# Patient Record
Sex: Female | Born: 1970 | Race: Black or African American | Hispanic: No | Marital: Married | Smoking: Never smoker
Health system: Southern US, Community
[De-identification: ages and names within clinical notes are randomized; demographics above are authoritative.]

## PROBLEM LIST (undated history)

## (undated) DIAGNOSIS — G459 Transient cerebral ischemic attack, unspecified: Secondary | ICD-10-CM

## (undated) DIAGNOSIS — G629 Polyneuropathy, unspecified: Secondary | ICD-10-CM

## (undated) DIAGNOSIS — Z807 Family history of other malignant neoplasms of lymphoid, hematopoietic and related tissues: Secondary | ICD-10-CM

## (undated) DIAGNOSIS — Z801 Family history of malignant neoplasm of trachea, bronchus and lung: Secondary | ICD-10-CM

## (undated) DIAGNOSIS — Z5189 Encounter for other specified aftercare: Secondary | ICD-10-CM

## (undated) DIAGNOSIS — Z853 Personal history of malignant neoplasm of breast: Secondary | ICD-10-CM

## (undated) DIAGNOSIS — D649 Anemia, unspecified: Secondary | ICD-10-CM

## (undated) DIAGNOSIS — G510 Bell's palsy: Secondary | ICD-10-CM

## (undated) DIAGNOSIS — I10 Essential (primary) hypertension: Secondary | ICD-10-CM

## (undated) DIAGNOSIS — C50912 Malignant neoplasm of unspecified site of left female breast: Secondary | ICD-10-CM

## (undated) DIAGNOSIS — N289 Disorder of kidney and ureter, unspecified: Secondary | ICD-10-CM

## (undated) DIAGNOSIS — F419 Anxiety disorder, unspecified: Secondary | ICD-10-CM

## (undated) HISTORY — DX: Family history of other malignant neoplasms of lymphoid, hematopoietic and related tissues: Z80.7

## (undated) HISTORY — DX: Encounter for other specified aftercare: Z51.89

## (undated) HISTORY — DX: Anemia, unspecified: D64.9

## (undated) HISTORY — PX: BREAST SURGERY: SHX581

## (undated) HISTORY — DX: Personal history of malignant neoplasm of breast: Z85.3

## (undated) HISTORY — DX: Family history of malignant neoplasm of trachea, bronchus and lung: Z80.1

## (undated) HISTORY — DX: Transient cerebral ischemic attack, unspecified: G45.9

## (undated) HISTORY — DX: Malignant neoplasm of unspecified site of left female breast: C50.912

## (undated) HISTORY — PX: UPPER GASTROINTESTINAL ENDOSCOPY: SHX188

## (undated) HISTORY — PX: WISDOM TOOTH EXTRACTION: SHX21

---

## 1998-12-02 DIAGNOSIS — C50912 Malignant neoplasm of unspecified site of left female breast: Secondary | ICD-10-CM

## 1998-12-02 HISTORY — PX: BREAST LUMPECTOMY WITH AXILLARY LYMPH NODE BIOPSY: SHX5593

## 1998-12-02 HISTORY — DX: Malignant neoplasm of unspecified site of left female breast: C50.912

## 2002-12-02 HISTORY — PX: TUBAL LIGATION: SHX77

## 2011-11-29 ENCOUNTER — Encounter: Payer: Self-pay | Admitting: *Deleted

## 2011-11-29 ENCOUNTER — Emergency Department (HOSPITAL_COMMUNITY)
Admission: EM | Admit: 2011-11-29 | Discharge: 2011-11-29 | Disposition: A | Discharge status: Home / Self Care | Payer: 59 | Attending: Emergency Medicine | Admitting: Emergency Medicine

## 2011-11-29 DIAGNOSIS — N949 Unspecified condition associated with female genital organs and menstrual cycle: Secondary | ICD-10-CM | POA: Insufficient documentation

## 2011-11-29 DIAGNOSIS — Z859 Personal history of malignant neoplasm, unspecified: Secondary | ICD-10-CM | POA: Insufficient documentation

## 2011-11-29 DIAGNOSIS — R1031 Right lower quadrant pain: Secondary | ICD-10-CM | POA: Insufficient documentation

## 2011-11-29 DIAGNOSIS — N898 Other specified noninflammatory disorders of vagina: Secondary | ICD-10-CM | POA: Insufficient documentation

## 2011-11-29 DIAGNOSIS — E669 Obesity, unspecified: Secondary | ICD-10-CM | POA: Insufficient documentation

## 2011-11-29 DIAGNOSIS — I10 Essential (primary) hypertension: Secondary | ICD-10-CM | POA: Insufficient documentation

## 2011-11-29 DIAGNOSIS — M545 Low back pain, unspecified: Secondary | ICD-10-CM | POA: Insufficient documentation

## 2011-11-29 DIAGNOSIS — Z79899 Other long term (current) drug therapy: Secondary | ICD-10-CM | POA: Insufficient documentation

## 2011-11-29 DIAGNOSIS — N939 Abnormal uterine and vaginal bleeding, unspecified: Secondary | ICD-10-CM

## 2011-11-29 HISTORY — DX: Essential (primary) hypertension: I10

## 2011-11-29 LAB — URINALYSIS, ROUTINE W REFLEX MICROSCOPIC
Bilirubin Urine: NEGATIVE
Glucose, UA: NEGATIVE mg/dL
Ketones, ur: NEGATIVE mg/dL
Leukocytes, UA: NEGATIVE
Nitrite: NEGATIVE
Protein, ur: NEGATIVE mg/dL
Specific Gravity, Urine: 1.015 (ref 1.005–1.030)
Urobilinogen, UA: 0.2 mg/dL (ref 0.0–1.0)
pH: 6 (ref 5.0–8.0)

## 2011-11-29 LAB — CBC
HCT: 35.8 % — ABNORMAL LOW (ref 36.0–46.0)
Hemoglobin: 11.6 g/dL — ABNORMAL LOW (ref 12.0–15.0)
MCH: 24.3 pg — ABNORMAL LOW (ref 26.0–34.0)
MCHC: 32.4 g/dL (ref 30.0–36.0)
MCV: 75.1 fL — ABNORMAL LOW (ref 78.0–100.0)
Platelets: 229 10*3/uL (ref 150–400)
RBC: 4.77 MIL/uL (ref 3.87–5.11)
RDW: 16.3 % — ABNORMAL HIGH (ref 11.5–15.5)
WBC: 6.3 10*3/uL (ref 4.0–10.5)

## 2011-11-29 LAB — BASIC METABOLIC PANEL
BUN: 14 mg/dL (ref 6–23)
CO2: 27 mEq/L (ref 19–32)
Calcium: 10.2 mg/dL (ref 8.4–10.5)
Chloride: 102 mEq/L (ref 96–112)
Creatinine, Ser: 0.82 mg/dL (ref 0.50–1.10)
GFR calc Af Amer: >90 mL/min (ref >90)
GFR calc non Af Amer: 88 mL/min — ABNORMAL LOW (ref >90)
Glucose, Bld: 83 mg/dL (ref 70–99)
Potassium: 3.3 mEq/L — ABNORMAL LOW (ref 3.5–5.1)
Sodium: 138 mEq/L (ref 135–145)

## 2011-11-29 LAB — URINE MICROSCOPIC-ADD ON

## 2011-11-29 LAB — POCT PREGNANCY, URINE: Preg Test, Ur: NEGATIVE

## 2011-11-29 MED ORDER — NORETHINDRONE ACETATE 5 MG PO TABS: 10.0000 mg | ORAL_TABLET | Freq: Every day | ORAL | Status: DC

## 2011-11-29 NOTE — ED Notes (Signed)
Pt in c/o abnormal vaginal bleeding, states she started her normal period christmas eve and has noted abnormally heavy bleeding and increased pain to right lower quadrant and back that is not normal for her

## 2011-11-29 NOTE — ED Provider Notes (Signed)
History     CSN: [id number]  Arrival date & time 11/29/11  1443   First MD Initiated Contact with Patient 11/29/11 1625      Chief Complaint  Patient presents with  . Vaginal Bleeding    (Consider location/radiation/quality/duration/timing/severity/associated sxs/prior treatment) Patient is a 40 y.o. female presenting with vaginal bleeding. The history is provided by the patient.  Vaginal Bleeding This is a new problem. The current episode started in the past 7 days. The problem occurs constantly. The problem has been unchanged. Associated symptoms include abdominal pain. Pertinent negatives include no change in bowel habit, chest pain, nausea, vertigo, visual change or vomiting. Associated symptoms comments: Negative for lightheadedness, dizziness, shortness of breath. The symptoms are aggravated by nothing. She has tried nothing for the symptoms.   the patient reports she has gone through 18 menstrual pads since last night. Reports she has had gradually increasing heavy menses since this summer, with approximately every other menstrual cycle heavy and alternating menstrual cycles normal for her without heavy bleeding or pain. The abdominal pain she describes to me as right lower quadrant pain as well as right-sided low back pain that is crampy and mild to moderate in severity. It comes in waves. She has tried ibuprofen for the pain with moderate relief. She recently moved to the area does not have a gynecologist here.  Past Medical History  Diagnosis Date  . Hypertension   . Cancer     History reviewed. No pertinent past surgical history.  History reviewed. No pertinent family history.  History  Substance Use Topics  . Smoking status: Never Smoker   . Smokeless tobacco: Not on file  . Alcohol Use: No     Review of Systems  Cardiovascular: Negative for chest pain.  Gastrointestinal: Positive for abdominal pain. Negative for nausea, vomiting and change in bowel habit.    Genitourinary: Positive for vaginal bleeding.  Neurological: Negative for vertigo.  10 systems reviewed and are negative for acute change except as noted in the HPI.   Allergies  Ace inhibitors  Home Medications   Current Outpatient Rx  Name Route Sig Dispense Refill  . ATENOLOL 25 MG PO TABS Oral Take 25 mg by mouth daily.      Marland Kitchen HYDROCHLOROTHIAZIDE 12.5 MG PO CAPS Oral Take 12.5 mg by mouth daily.      . IBUPROFEN 200 MG PO TABS Oral Take 400 mg by mouth every 6 (six) hours as needed. For pain.     . IRON PO Oral Take 1 tablet by mouth daily.      . ADULT MULTIVITAMIN W/MINERALS CH Oral Take 1 tablet by mouth daily.        BP 192/95  Pulse 71  Temp(Src) 99 F (37.2 C) (Oral)  Resp 20  SpO2 100%  Physical Exam  Nursing note and vitals reviewed. Constitutional: She is oriented to person, place, and time. She appears well-developed and well-nourished. No distress.       obese  HENT:  Head: Normocephalic and atraumatic.  Right Ear: External ear normal.  Left Ear: External ear normal.  Mouth/Throat: Oropharynx is clear and moist.  Eyes: Conjunctivae and EOM are normal. Pupils are equal, round, and reactive to light.  Neck: Normal range of motion. Neck supple.  Cardiovascular: Normal rate, regular rhythm, normal heart sounds and intact distal pulses.   Pulmonary/Chest: Effort normal and breath sounds normal. No respiratory distress. She has no wheezes.  Abdominal: Soft. Bowel sounds are normal. She exhibits no  distension and no mass. There is no tenderness. There is no rebound and no guarding.  Genitourinary: There is no tenderness or lesion on the right labia. There is no tenderness or lesion on the left labia. Cervix exhibits no motion tenderness. Right adnexum displays tenderness. Right adnexum displays no mass and no fullness. Left adnexum displays no mass, no tenderness and no fullness. There is bleeding around the vagina. No tenderness around the vagina. No foreign body  around the vagina. No signs of injury around the vagina.       Exam limited by body habitus   Musculoskeletal: Normal range of motion. She exhibits no edema and no tenderness.  Neurological: She is alert and oriented to person, place, and time. No cranial nerve deficit. Coordination normal.  Skin: Skin is warm and dry. No rash noted. No pallor.  Psychiatric: Her behavior is normal.    ED Course  Procedures (including critical care time)  Labs Reviewed  CBC - Abnormal; Notable for the following:    Hemoglobin 11.6 (*)    HCT 35.8 (*)    MCV 75.1 (*)    MCH 24.3 (*)    RDW 16.3 (*)    All other components within normal limits  BASIC METABOLIC PANEL - Abnormal; Notable for the following:    Potassium 3.3 (*)    GFR calc non Af Amer 88 (*)    All other components within normal limits  URINALYSIS, ROUTINE W REFLEX MICROSCOPIC - Abnormal; Notable for the following:    Hgb urine dipstick LARGE (*)    All other components within normal limits  URINE MICROSCOPIC-ADD ON - Abnormal; Notable for the following:    Squamous Epithelial / LPF FEW (*)    All other components within normal limits  POCT PREGNANCY, URINE  POCT PREGNANCY, URINE   No results found.   1. Vaginal bleeding, abnormal       MDM  Heavy menses. Mild anemia. Slight right adnexal tenderness- i do not suspect ovarian torsion or ectopic pregnancy in this patient and [place] in the ED is not indicated. I have spoken with Dr Benjie Karvonen who will see pt in office and has recommended initiation of Aygestin to slow bleeding. Pt voices understanding of plan.        [place], [place] 11/29/11 1944

## 2011-11-30 NOTE — ED Provider Notes (Signed)
Medical screening examination/treatment/procedure(s) were performed by non-physician practitioner and as supervising physician I was immediately available for consultation/collaboration.  Jasper Riling. Alvino Chapel, MD 11/30/11 [phone]

## 2011-12-30 ENCOUNTER — Telehealth: Payer: Self-pay | Admitting: *Deleted

## 2011-12-30 NOTE — Telephone Encounter (Signed)
Confirmed 01/31/12 genetics appt w/ pt.  Called referring w/ appt info.

## 2012-01-31 ENCOUNTER — Ambulatory Visit: Payer: 59

## 2012-01-31 NOTE — Progress Notes (Unsigned)
Pt. Failed to keep appt for genetic counseling

## 2012-02-14 ENCOUNTER — Other Ambulatory Visit: Payer: Self-pay | Admitting: Obstetrics & Gynecology

## 2012-02-14 DIAGNOSIS — Z1231 Encounter for screening mammogram for malignant neoplasm of breast: Secondary | ICD-10-CM

## 2012-03-11 ENCOUNTER — Ambulatory Visit
Admission: RE | Admit: 2012-03-11 | Discharge: 2012-03-11 | Disposition: A | Discharge status: Home / Self Care | Payer: 59 | Source: Ambulatory Visit | Attending: Obstetrics & Gynecology | Admitting: Obstetrics & Gynecology

## 2012-03-11 DIAGNOSIS — Z1231 Encounter for screening mammogram for malignant neoplasm of breast: Secondary | ICD-10-CM

## 2013-04-15 ENCOUNTER — Other Ambulatory Visit: Payer: Self-pay

## 2013-04-15 DIAGNOSIS — Z1231 Encounter for screening mammogram for malignant neoplasm of breast: Secondary | ICD-10-CM

## 2013-04-29 ENCOUNTER — Ambulatory Visit
Admission: RE | Admit: 2013-04-29 | Discharge: 2013-04-29 | Disposition: A | Discharge status: Home / Self Care | Payer: 59 | Source: Ambulatory Visit

## 2013-04-29 DIAGNOSIS — Z1231 Encounter for screening mammogram for malignant neoplasm of breast: Secondary | ICD-10-CM

## 2014-08-02 ENCOUNTER — Ambulatory Visit (INDEPENDENT_AMBULATORY_CARE_PROVIDER_SITE_OTHER): Payer: 59 | Admitting: Obstetrics & Gynecology

## 2014-08-02 ENCOUNTER — Encounter: Payer: Self-pay | Admitting: Obstetrics & Gynecology

## 2014-08-02 VITALS — BP 139/94 | HR 70 | Ht 59.75 in | Wt 211.2 lb

## 2014-08-02 DIAGNOSIS — N924 Excessive bleeding in the premenopausal period: Secondary | ICD-10-CM

## 2014-08-02 DIAGNOSIS — D259 Leiomyoma of uterus, unspecified: Secondary | ICD-10-CM

## 2014-08-02 DIAGNOSIS — Z01419 Encounter for gynecological examination (general) (routine) without abnormal findings: Secondary | ICD-10-CM

## 2014-08-02 DIAGNOSIS — Z124 Encounter for screening for malignant neoplasm of cervix: Secondary | ICD-10-CM

## 2014-08-02 DIAGNOSIS — D219 Benign neoplasm of connective and other soft tissue, unspecified: Secondary | ICD-10-CM | POA: Insufficient documentation

## 2014-08-02 DIAGNOSIS — Z1151 Encounter for screening for human papillomavirus (HPV): Secondary | ICD-10-CM

## 2014-08-02 NOTE — Progress Notes (Signed)
    GYNECOLOGY CLINIC ANNUAL PREVENTATIVE CARE ENCOUNTER NOTE  Subjective:     [name] [name] is a 43 y.o. [phone] female here for a routine annual gynecologic exam and to establish care.  Current complaints: missed period in July, but very heavy and crampy period in August. Was seen by Dr. Benjie Karvonen [facility]) in the past, last pap was last year. She does have a history of fibroids, not been checked for thyroid dysfunction recently.  No problems with sexual activity, married for 23 years!   Gynecologic History Patient's last menstrual period was 07/28/2014. Contraception: tubal ligation in 2004 Last Pap: May 2014. Results were: normal Last mammogram: 04/29/2013. Results were: normal.    History of left breast cancer s/p lumpectomy, chemotherapy and radiation in 2000.  Obstetric History OB History  Gravida Para Term Preterm AB SAB TAB Ectopic Multiple Living  3 3 2 1  0 0 0 0 0 3    # Outcome Date GA Lbr Len/2nd Weight Sex Delivery Anes PTL Lv  3 PRE 2004    F SVD     2 TRM 2000    F SVD     1 TRM 1994    M SVD       Obstetric Comments  Two adopted children Female age 28 and Female age 75.   The following portions of the patient's history were reviewed and updated as appropriate: allergies, current medications, past family history, past medical history, past social history, past surgical history and problem list.  Review of Systems Pertinent items are noted in HPI.    Objective:   BP 139/94  Pulse 70  Ht 4' 11.75" (1.518 m)  Wt 211 lb 3.2 oz (95.8 kg)  BMI 41.57 kg/m2  LMP 07/28/2014 GENERAL: Well-developed, obese female in no acute distress.  HEENT: Normocephalic, atraumatic. Sclerae anicteric.  NECK: Supple. Normal thyroid.  LUNGS: Clear to auscultation bilaterally.  HEART: Regular rate and rhythm.  BREASTS: Symmetric in size. No masses, skin changes, nipple drainage, or lymphadenopathy.  Well-healed lumpectomy scar above left areola. ABDOMEN: Soft, obese, nontender,  nondistended. No organomegaly palpated. PELVIC: Normal external female genitalia. Vagina is pink and rugated. Bloody discharge noted, no active bleeding. Normal cervix contour. Pap smear obtained. Unable to palpate uterus or adnexa secondary to habitus, but had some left sided tenderness on bimanual exam EXTREMITIES: No cyanosis, clubbing, or edema, 2+ distal pulses.  Assessment:   Annual gynecologic examination Heavy menstrual period   Plan:   Pap done, will follow up results and manage accordingly. TSH checked, will also check pelvic ultrasound to follow up fibroids. Bleeding precautions reviewed. Routine preventative health maintenance measures emphasized   Verita Schneiders, MD, [place] Attending [place] for [place]

## 2014-08-02 NOTE — Patient Instructions (Signed)
Preventive Care for Adults A healthy lifestyle and preventive care can promote health and wellness. Preventive health guidelines for women include the following key practices.  A routine yearly physical is a good way to check with your health care provider about your health and preventive screening. It is a chance to share any concerns and updates on your health and to receive a thorough exam.  Visit your dentist for a routine exam and preventive care every 6 months. Brush your teeth twice a day and floss once a day. Good oral hygiene prevents tooth decay and gum disease.  The frequency of eye exams is based on your age, health, family medical history, use of contact lenses, and other factors. Follow your health care provider's recommendations for frequency of eye exams.  Eat a healthy diet. Foods like vegetables, fruits, whole grains, low-fat dairy products, and lean protein foods contain the nutrients you need without too many calories. Decrease your intake of foods high in solid fats, added sugars, and salt. Eat the right amount of calories for you.Get information about a proper diet from your health care provider, if necessary.  Regular physical exercise is one of the most important things you can do for your health. Most adults should get at least 150 minutes of moderate-intensity exercise (any activity that increases your heart rate and causes you to sweat) each week. In addition, most adults need muscle-strengthening exercises on 2 or more days a week.  Maintain a healthy weight. The body mass index (BMI) is a screening tool to identify possible weight problems. It provides an estimate of body fat based on height and weight. Your health care provider can find your BMI and can help you achieve or maintain a healthy weight.For adults 20 years and older:  A BMI below 18.5 is considered underweight.  A BMI of 18.5 to 24.9 is normal.  A BMI of 25 to 29.9 is considered overweight.  A BMI of  30 and above is considered obese.  Maintain normal blood lipids and cholesterol levels by exercising and minimizing your intake of saturated fat. Eat a balanced diet with plenty of fruit and vegetables. Blood tests for lipids and cholesterol should begin at age 20 and be repeated every 5 years. If your lipid or cholesterol levels are high, you are over 50, or you are at high risk for heart disease, you may need your cholesterol levels checked more frequently.Ongoing high lipid and cholesterol levels should be treated with medicines if diet and exercise are not working.  If you smoke, find out from your health care provider how to quit. If you do not use tobacco, do not start.  Lung cancer screening is recommended for adults aged 55-80 years who are at high risk for developing lung cancer because of a history of smoking. A yearly low-dose CT scan of the lungs is recommended for people who have at least a 30-pack-year history of smoking and are a current smoker or have quit within the past 15 years. A pack year of smoking is smoking an average of 1 pack of cigarettes a day for 1 year (for example: 1 pack a day for 30 years or 2 packs a day for 15 years). Yearly screening should continue until the smoker has stopped smoking for at least 15 years. Yearly screening should be stopped for people who develop a health problem that would prevent them from having lung cancer treatment.  If you are pregnant, do not drink alcohol. If you are breastfeeding,   be very cautious about drinking alcohol. If you are not pregnant and choose to drink alcohol, do not have more than 1 drink per day. One drink is considered to be 12 ounces (355 mL) of beer, 5 ounces (148 mL) of wine, or 1.5 ounces (44 mL) of liquor.  Avoid use of street drugs. Do not share needles with anyone. Ask for help if you need support or instructions about stopping the use of drugs.  High blood pressure causes heart disease and increases the risk of  stroke. Your blood pressure should be checked at least every 1 to 2 years. Ongoing high blood pressure should be treated with medicines if weight loss and exercise do not work.  If you are 75-52 years old, ask your health care provider if you should take aspirin to prevent strokes.  Diabetes screening involves taking a blood sample to check your fasting blood sugar level. This should be done once every 3 years, after age 15, if you are within normal weight and without risk factors for diabetes. Testing should be considered at a younger age or be carried out more frequently if you are overweight and have at least 1 risk factor for diabetes.  Breast cancer screening is essential preventive care for women. You should practice "breast self-awareness." This means understanding the normal appearance and feel of your breasts and may include breast self-examination. Any changes detected, no matter how small, should be reported to a health care provider. Women in their 58s and 30s should have a clinical breast exam (CBE) by a health care provider as part of a regular health exam every 1 to 3 years. After age 16, women should have a CBE every year. Starting at age 53, women should consider having a mammogram (breast X-ray test) every year. Women who have a family history of breast cancer should talk to their health care provider about genetic screening. Women at a high risk of breast cancer should talk to their health care providers about having an MRI and a mammogram every year.  Breast cancer gene (BRCA)-related cancer risk assessment is recommended for women who have family members with BRCA-related cancers. BRCA-related cancers include breast, ovarian, tubal, and peritoneal cancers. Having family members with these cancers may be associated with an increased risk for harmful changes (mutations) in the breast cancer genes BRCA1 and BRCA2. Results of the assessment will determine the need for genetic counseling and  BRCA1 and BRCA2 testing.  Routine pelvic exams to screen for cancer are no longer recommended for nonpregnant women who are considered low risk for cancer of the pelvic organs (ovaries, uterus, and vagina) and who do not have symptoms. Ask your health care provider if a screening pelvic exam is right for you.  If you have had past treatment for cervical cancer or a condition that could lead to cancer, you need Pap tests and screening for cancer for at least 20 years after your treatment. If Pap tests have been discontinued, your risk factors (such as having a new sexual partner) need to be reassessed to determine if screening should be resumed. Some women have medical problems that increase the chance of getting cervical cancer. In these cases, your health care provider may recommend more frequent screening and Pap tests.  The HPV test is an additional test that may be used for cervical cancer screening. The HPV test looks for the virus that can cause the cell changes on the cervix. The cells collected during the Pap test can be  tested for HPV. The HPV test could be used to screen women aged 30 years and older, and should be used in women of any age who have unclear Pap test results. After the age of 30, women should have HPV testing at the same frequency as a Pap test.  Colorectal cancer can be detected and often prevented. Most routine colorectal cancer screening begins at the age of 50 years and continues through age 75 years. However, your health care provider may recommend screening at an earlier age if you have risk factors for colon cancer. On a yearly basis, your health care provider may provide home test kits to check for hidden blood in the stool. Use of a small camera at the end of a tube, to directly examine the colon (sigmoidoscopy or colonoscopy), can detect the earliest forms of colorectal cancer. Talk to your health care provider about this at age 50, when routine screening begins. Direct  exam of the colon should be repeated every 5-10 years through age 75 years, unless early forms of pre-cancerous polyps or small growths are found.  People who are at an increased risk for hepatitis B should be screened for this virus. You are considered at high risk for hepatitis B if:  You were born in a country where hepatitis B occurs often. Talk with your health care provider about which countries are considered high risk.  Your parents were born in a high-risk country and you have not received a shot to protect against hepatitis B (hepatitis B vaccine).  You have HIV or AIDS.  You use needles to inject street drugs.  You live with, or have sex with, someone who has hepatitis B.  You get hemodialysis treatment.  You take certain medicines for conditions like cancer, organ transplantation, and autoimmune conditions.  Hepatitis C blood testing is recommended for all people born from 1945 through 1965 and any individual with known risks for hepatitis C.  Practice safe sex. Use condoms and avoid high-risk sexual practices to reduce the spread of sexually transmitted infections (STIs). STIs include gonorrhea, chlamydia, syphilis, trichomonas, herpes, HPV, and human immunodeficiency virus (HIV). Herpes, HIV, and HPV are viral illnesses that have no cure. They can result in disability, cancer, and death.  You should be screened for sexually transmitted illnesses (STIs) including gonorrhea and chlamydia if:  You are sexually active and are younger than 24 years.  You are older than 24 years and your health care provider tells you that you are at risk for this type of infection.  Your sexual activity has changed since you were last screened and you are at an increased risk for chlamydia or gonorrhea. Ask your health care provider if you are at risk.  If you are at risk of being infected with HIV, it is recommended that you take a prescription medicine daily to prevent HIV infection. This is  called preexposure prophylaxis (PrEP). You are considered at risk if:  You are a heterosexual woman, are sexually active, and are at increased risk for HIV infection.  You take drugs by injection.  You are sexually active with a partner who has HIV.  Talk with your health care provider about whether you are at high risk of being infected with HIV. If you choose to begin PrEP, you should first be tested for HIV. You should then be tested every 3 months for as long as you are taking PrEP.  Osteoporosis is a disease in which the bones lose minerals and strength   with aging. This can result in serious bone fractures or breaks. The risk of osteoporosis can be identified using a bone density scan. Women ages 65 years and over and women at risk for fractures or osteoporosis should discuss screening with their health care providers. Ask your health care provider whether you should take a calcium supplement or vitamin D to reduce the rate of osteoporosis.  Menopause can be associated with physical symptoms and risks. Hormone replacement therapy is available to decrease symptoms and risks. You should talk to your health care provider about whether hormone replacement therapy is right for you.  Use sunscreen. Apply sunscreen liberally and repeatedly throughout the day. You should seek shade when your shadow is shorter than you. Protect yourself by wearing long sleeves, pants, a wide-brimmed hat, and sunglasses year round, whenever you are outdoors.  Once a month, do a whole body skin exam, using a mirror to look at the skin on your back. Tell your health care provider of new moles, moles that have irregular borders, moles that are larger than a pencil eraser, or moles that have changed in shape or color.  Stay current with required vaccines (immunizations).  Influenza vaccine. All adults should be immunized every year.  Tetanus, diphtheria, and acellular pertussis (Td, Tdap) vaccine. Pregnant women should  receive 1 dose of Tdap vaccine during each pregnancy. The dose should be obtained regardless of the length of time since the last dose. Immunization is preferred during the 27th-36th week of gestation. An adult who has not previously received Tdap or who does not know her vaccine status should receive 1 dose of Tdap. This initial dose should be followed by tetanus and diphtheria toxoids (Td) booster doses every 10 years. Adults with an unknown or incomplete history of completing a 3-dose immunization series with Td-containing vaccines should begin or complete a primary immunization series including a Tdap dose. Adults should receive a Td booster every 10 years.  Varicella vaccine. An adult without evidence of immunity to varicella should receive 2 doses or a second dose if she has previously received 1 dose. Pregnant females who do not have evidence of immunity should receive the first dose after pregnancy. This first dose should be obtained before leaving the health care facility. The second dose should be obtained 4-8 weeks after the first dose.  Human papillomavirus (HPV) vaccine. Females aged 13-26 years who have not received the vaccine previously should obtain the 3-dose series. The vaccine is not recommended for use in pregnant females. However, pregnancy testing is not needed before receiving a dose. If a female is found to be pregnant after receiving a dose, no treatment is needed. In that case, the remaining doses should be delayed until after the pregnancy. Immunization is recommended for any person with an immunocompromised condition through the age of 26 years if she did not get any or all doses earlier. During the 3-dose series, the second dose should be obtained 4-8 weeks after the first dose. The third dose should be obtained 24 weeks after the first dose and 16 weeks after the second dose.  Zoster vaccine. One dose is recommended for adults aged 60 years or older unless certain conditions are  present.  Measles, mumps, and rubella (MMR) vaccine. Adults born before 1957 generally are considered immune to measles and mumps. Adults born in 1957 or later should have 1 or more doses of MMR vaccine unless there is a contraindication to the vaccine or there is laboratory evidence of immunity to   each of the three diseases. A routine second dose of MMR vaccine should be obtained at least 28 days after the first dose for students attending postsecondary schools, health care workers, or international travelers. People who received inactivated measles vaccine or an unknown type of measles vaccine during 1963-1967 should receive 2 doses of MMR vaccine. People who received inactivated mumps vaccine or an unknown type of mumps vaccine before 1979 and are at high risk for mumps infection should consider immunization with 2 doses of MMR vaccine. For females of childbearing age, rubella immunity should be determined. If there is no evidence of immunity, females who are not pregnant should be vaccinated. If there is no evidence of immunity, females who are pregnant should delay immunization until after pregnancy. Unvaccinated health care workers born before 1957 who lack laboratory evidence of measles, mumps, or rubella immunity or laboratory confirmation of disease should consider measles and mumps immunization with 2 doses of MMR vaccine or rubella immunization with 1 dose of MMR vaccine.  Pneumococcal 13-valent conjugate (PCV13) vaccine. When indicated, a person who is uncertain of her immunization history and has no record of immunization should receive the PCV13 vaccine. An adult aged 19 years or older who has certain medical conditions and has not been previously immunized should receive 1 dose of PCV13 vaccine. This PCV13 should be followed with a dose of pneumococcal polysaccharide (PPSV23) vaccine. The PPSV23 vaccine dose should be obtained at least 8 weeks after the dose of PCV13 vaccine. An adult aged 19  years or older who has certain medical conditions and previously received 1 or more doses of PPSV23 vaccine should receive 1 dose of PCV13. The PCV13 vaccine dose should be obtained 1 or more years after the last PPSV23 vaccine dose.  Pneumococcal polysaccharide (PPSV23) vaccine. When PCV13 is also indicated, PCV13 should be obtained first. All adults aged 65 years and older should be immunized. An adult younger than age 65 years who has certain medical conditions should be immunized. Any person who resides in a nursing home or long-term care facility should be immunized. An adult smoker should be immunized. People with an immunocompromised condition and certain other conditions should receive both PCV13 and PPSV23 vaccines. People with human immunodeficiency virus (HIV) infection should be immunized as soon as possible after diagnosis. Immunization during chemotherapy or radiation therapy should be avoided. Routine use of PPSV23 vaccine is not recommended for American Indians, [place] Natives, or people younger than 65 years unless there are medical conditions that require PPSV23 vaccine. When indicated, people who have unknown immunization and have no record of immunization should receive PPSV23 vaccine. One-time revaccination 5 years after the first dose of PPSV23 is recommended for people aged 19-64 years who have chronic kidney failure, nephrotic syndrome, asplenia, or immunocompromised conditions. People who received 1-2 doses of PPSV23 before age 65 years should receive another dose of PPSV23 vaccine at age 65 years or later if at least 5 years have passed since the previous dose. Doses of PPSV23 are not needed for people immunized with PPSV23 at or after age 65 years.  Meningococcal vaccine. Adults with asplenia or persistent complement component deficiencies should receive 2 doses of quadrivalent meningococcal conjugate (MenACWY-D) vaccine. The doses should be obtained at least 2 months apart.  Microbiologists working with certain meningococcal bacteria, military recruits, people at risk during an outbreak, and people who travel to or live in countries with a high rate of meningitis should be immunized. A first-year college student up through age   21 years who is living in a residence hall should receive a dose if she did not receive a dose on or after her 16th birthday. Adults who have certain high-risk conditions should receive one or more doses of vaccine.  Hepatitis A vaccine. Adults who wish to be protected from this disease, have certain high-risk conditions, work with hepatitis A-infected animals, work in hepatitis A research labs, or travel to or work in countries with a high rate of hepatitis A should be immunized. Adults who were previously unvaccinated and who anticipate close contact with an international adoptee during the first 60 days after arrival in the [place] from a country with a high rate of hepatitis A should be immunized.  Hepatitis B vaccine. Adults who wish to be protected from this disease, have certain high-risk conditions, may be exposed to blood or other infectious body fluids, are household contacts or sex partners of hepatitis B positive people, are clients or workers in certain care facilities, or travel to or work in countries with a high rate of hepatitis B should be immunized.  Haemophilus influenzae type b (Hib) vaccine. A previously unvaccinated person with asplenia or sickle cell disease or having a scheduled splenectomy should receive 1 dose of Hib vaccine. Regardless of previous immunization, a recipient of a hematopoietic stem cell transplant should receive a 3-dose series 6-12 months after her successful transplant. Hib vaccine is not recommended for adults with HIV infection. Preventive Services / Frequency Ages 64 to 68 years  Blood pressure check.** / Every 1 to 2 years.  Lipid and cholesterol check.** / Every 5 years beginning at age  22.  Clinical breast exam.** / Every 3 years for women in their 88s and 53s.  BRCA-related cancer risk assessment.** / For women who have family members with a BRCA-related cancer (breast, ovarian, tubal, or peritoneal cancers).  Pap test.** / Every 2 years from ages 90 through 51. Every 3 years starting at age 21 through age 56 or 3 with a history of 3 consecutive normal Pap tests.  HPV screening.** / Every 3 years from ages 24 through ages 1 to 46 with a history of 3 consecutive normal Pap tests.  Hepatitis C blood test.** / For any individual with known risks for hepatitis C.  Skin self-exam. / Monthly.  Influenza vaccine. / Every year.  Tetanus, diphtheria, and acellular pertussis (Tdap, Td) vaccine.** / Consult your health care provider. Pregnant women should receive 1 dose of Tdap vaccine during each pregnancy. 1 dose of Td every 10 years.  Varicella vaccine.** / Consult your health care provider. Pregnant females who do not have evidence of immunity should receive the first dose after pregnancy.  HPV vaccine. / 3 doses over 6 months, if 72 and younger. The vaccine is not recommended for use in pregnant females. However, pregnancy testing is not needed before receiving a dose.  Measles, mumps, rubella (MMR) vaccine.** / You need at least 1 dose of MMR if you were born in 1957 or later. You may also need a 2nd dose. For females of childbearing age, rubella immunity should be determined. If there is no evidence of immunity, females who are not pregnant should be vaccinated. If there is no evidence of immunity, females who are pregnant should delay immunization until after pregnancy.  Pneumococcal 13-valent conjugate (PCV13) vaccine.** / Consult your health care provider.  Pneumococcal polysaccharide (PPSV23) vaccine.** / 1 to 2 doses if you smoke cigarettes or if you have certain conditions.  Meningococcal vaccine.** /  1 dose if you are age 19 to 21 years and a first-year college  student living in a residence hall, or have one of several medical conditions, you need to get vaccinated against meningococcal disease. You may also need additional booster doses.  Hepatitis A vaccine.** / Consult your health care provider.  Hepatitis B vaccine.** / Consult your health care provider.  Haemophilus influenzae type b (Hib) vaccine.** / Consult your health care provider. Ages 40 to 64 years  Blood pressure check.** / Every 1 to 2 years.  Lipid and cholesterol check.** / Every 5 years beginning at age 20 years.  Lung cancer screening. / Every year if you are aged 55-80 years and have a 30-pack-year history of smoking and currently smoke or have quit within the past 15 years. Yearly screening is stopped once you have quit smoking for at least 15 years or develop a health problem that would prevent you from having lung cancer treatment.  Clinical breast exam.** / Every year after age 40 years.  BRCA-related cancer risk assessment.** / For women who have family members with a BRCA-related cancer (breast, ovarian, tubal, or peritoneal cancers).  Mammogram.** / Every year beginning at age 40 years and continuing for as long as you are in good health. Consult with your health care provider.  Pap test.** / Every 3 years starting at age 30 years through age 65 or 70 years with a history of 3 consecutive normal Pap tests.  HPV screening.** / Every 3 years from ages 30 years through ages 65 to 70 years with a history of 3 consecutive normal Pap tests.  Fecal occult blood test (FOBT) of stool. / Every year beginning at age 50 years and continuing until age 75 years. You may not need to do this test if you get a colonoscopy every 10 years.  Flexible sigmoidoscopy or colonoscopy.** / Every 5 years for a flexible sigmoidoscopy or every 10 years for a colonoscopy beginning at age 50 years and continuing until age 75 years.  Hepatitis C blood test.** / For all people born from 1945 through  1965 and any individual with known risks for hepatitis C.  Skin self-exam. / Monthly.  Influenza vaccine. / Every year.  Tetanus, diphtheria, and acellular pertussis (Tdap/Td) vaccine.** / Consult your health care provider. Pregnant women should receive 1 dose of Tdap vaccine during each pregnancy. 1 dose of Td every 10 years.  Varicella vaccine.** / Consult your health care provider. Pregnant females who do not have evidence of immunity should receive the first dose after pregnancy.  Zoster vaccine.** / 1 dose for adults aged 60 years or older.  Measles, mumps, rubella (MMR) vaccine.** / You need at least 1 dose of MMR if you were born in 1957 or later. You may also need a 2nd dose. For females of childbearing age, rubella immunity should be determined. If there is no evidence of immunity, females who are not pregnant should be vaccinated. If there is no evidence of immunity, females who are pregnant should delay immunization until after pregnancy.  Pneumococcal 13-valent conjugate (PCV13) vaccine.** / Consult your health care provider.  Pneumococcal polysaccharide (PPSV23) vaccine.** / 1 to 2 doses if you smoke cigarettes or if you have certain conditions.  Meningococcal vaccine.** / Consult your health care provider.  Hepatitis A vaccine.** / Consult your health care provider.  Hepatitis B vaccine.** / Consult your health care provider.  Haemophilus influenzae type b (Hib) vaccine.** / Consult your health care provider. Ages 65   years and over  Blood pressure check.** / Every 1 to 2 years.  Lipid and cholesterol check.** / Every 5 years beginning at age 48 years.  Lung cancer screening. / Every year if you are aged 68-80 years and have a 30-pack-year history of smoking and currently smoke or have quit within the past 15 years. Yearly screening is stopped once you have quit smoking for at least 15 years or develop a health problem that would prevent you from having lung cancer  treatment.  Clinical breast exam.** / Every year after age 4 years.  BRCA-related cancer risk assessment.** / For women who have family members with a BRCA-related cancer (breast, ovarian, tubal, or peritoneal cancers).  Mammogram.** / Every year beginning at age 69 years and continuing for as long as you are in good health. Consult with your health care provider.  Pap test.** / Every 3 years starting at age 33 years through age 32 or 70 years with 3 consecutive normal Pap tests. Testing can be stopped between 65 and 70 years with 3 consecutive normal Pap tests and no abnormal Pap or HPV tests in the past 10 years.  HPV screening.** / Every 3 years from ages 32 years through ages 1 or 33 years with a history of 3 consecutive normal Pap tests. Testing can be stopped between 65 and 70 years with 3 consecutive normal Pap tests and no abnormal Pap or HPV tests in the past 10 years.  Fecal occult blood test (FOBT) of stool. / Every year beginning at age 77 years and continuing until age 27 years. You may not need to do this test if you get a colonoscopy every 10 years.  Flexible sigmoidoscopy or colonoscopy.** / Every 5 years for a flexible sigmoidoscopy or every 10 years for a colonoscopy beginning at age 61 years and continuing until age 68 years.  Hepatitis C blood test.** / For all people born from [birth year] through 1965 and any individual with known risks for hepatitis C.  Osteoporosis screening.** / A one-time screening for women ages 73 years and over and women at risk for fractures or osteoporosis.  Skin self-exam. / Monthly.  Influenza vaccine. / Every year.  Tetanus, diphtheria, and acellular pertussis (Tdap/Td) vaccine.** / 1 dose of Td every 10 years.  Varicella vaccine.** / Consult your health care provider.  Zoster vaccine.** / 1 dose for adults aged 67 years or older.  Pneumococcal 13-valent conjugate (PCV13) vaccine.** / Consult your health care provider.  Pneumococcal  polysaccharide (PPSV23) vaccine.** / 1 dose for all adults aged 43 years and older.  Meningococcal vaccine.** / Consult your health care provider.  Hepatitis A vaccine.** / Consult your health care provider.  Hepatitis B vaccine.** / Consult your health care provider.  Haemophilus influenzae type b (Hib) vaccine.** / Consult your health care provider. ** Family history and personal history of risk and conditions may change your health care provider's recommendations. Document Released: 01/14/2002 Document Revised: 04/04/2014 Document Reviewed: 04/15/2011 [facility] Patient Information 2015 [place], [place]. This information is not intended to replace advice given to you by your health care provider. Make sure you discuss any questions you have with your health care provider.  Thank you for enrolling in [facility]. Please follow the instructions below to securely access your online medical record. MyChart allows you to send messages to your doctor, view your test results, manage appointments, and more.   How Do I Sign Up? 1. In your [employer], go to [employer] and  enter [email or web address]. 2. Click on the Sign Up Now link in the Sign In box. You will see the New Member Sign Up page. 3. Enter your MyChart Access Code exactly as it appears below. You will not need to use this code after you've completed the sign-up process. If you do not sign up before the expiration date, you must request a new code.  MyChart Access Code: [email or web address] Expires: 10/01/2014  8:54 AM  4. Enter your Social Security Number ([id number]) and Date of Birth (mm/dd/yyyy) as indicated and click Submit. You will be taken to the next sign-up page. 5. Create a MyChart ID. This will be your MyChart login ID and cannot be changed, so think of one that is secure and easy to remember. 6. Create a MyChart password. You can change your password at any time. 7. Enter your Password Reset Question and Answer.  This can be used at a later time if you forget your password.  8. Enter your e-mail address. You will receive e-mail notification when new information is available in [place]. 9. Click Sign Up. You can now view your medical record.   Additional Information Remember, MyChart is NOT to be used for urgent needs. For medical emergencies, dial 911.

## 2014-08-02 NOTE — Progress Notes (Signed)
Here for yearly exam.  She did miss a period in July but it came back in August very heavy and with a lot more cramps.

## 2014-08-03 LAB — TSH: TSH: 1.577 u[IU]/mL (ref 0.350–4.500)

## 2014-08-04 LAB — CYTOLOGY - PAP

## 2014-09-22 ENCOUNTER — Ambulatory Visit (HOSPITAL_COMMUNITY)
Admission: RE | Admit: 2014-09-22 | Discharge: 2014-09-22 | Disposition: A | Discharge status: Home / Self Care | Payer: 59 | Source: Ambulatory Visit | Attending: Obstetrics & Gynecology | Admitting: Obstetrics & Gynecology

## 2014-09-22 ENCOUNTER — Encounter: Payer: Self-pay | Admitting: Obstetrics & Gynecology

## 2014-09-22 DIAGNOSIS — I10 Essential (primary) hypertension: Secondary | ICD-10-CM | POA: Diagnosis not present

## 2014-09-22 DIAGNOSIS — N924 Excessive bleeding in the premenopausal period: Secondary | ICD-10-CM

## 2014-09-22 DIAGNOSIS — Z853 Personal history of malignant neoplasm of breast: Secondary | ICD-10-CM | POA: Diagnosis not present

## 2014-09-22 DIAGNOSIS — N938 Other specified abnormal uterine and vaginal bleeding: Secondary | ICD-10-CM | POA: Insufficient documentation

## 2014-09-22 DIAGNOSIS — D259 Leiomyoma of uterus, unspecified: Secondary | ICD-10-CM

## 2014-10-03 ENCOUNTER — Encounter: Payer: Self-pay | Admitting: Obstetrics & Gynecology

## 2016-11-06 ENCOUNTER — Encounter: Payer: Self-pay | Admitting: Emergency Medicine

## 2016-11-06 ENCOUNTER — Emergency Department
Admission: EM | Admit: 2016-11-06 | Discharge: 2016-11-06 | Disposition: A | Discharge status: Home / Self Care | Payer: 59 | Attending: Emergency Medicine | Admitting: Emergency Medicine

## 2016-11-06 DIAGNOSIS — Z79899 Other long term (current) drug therapy: Secondary | ICD-10-CM | POA: Insufficient documentation

## 2016-11-06 DIAGNOSIS — Z853 Personal history of malignant neoplasm of breast: Secondary | ICD-10-CM | POA: Insufficient documentation

## 2016-11-06 DIAGNOSIS — R799 Abnormal finding of blood chemistry, unspecified: Secondary | ICD-10-CM | POA: Diagnosis present

## 2016-11-06 DIAGNOSIS — I10 Essential (primary) hypertension: Secondary | ICD-10-CM | POA: Insufficient documentation

## 2016-11-06 HISTORY — DX: Disorder of kidney and ureter, unspecified: N28.9

## 2016-11-06 LAB — BASIC METABOLIC PANEL
Anion gap: 7 (ref 5–15)
BUN: 18 mg/dL (ref 6–20)
CO2: 24 mmol/L (ref 22–32)
Calcium: 9.7 mg/dL (ref 8.9–10.3)
Chloride: 108 mmol/L (ref 101–111)
Creatinine, Ser: 1.1 mg/dL — ABNORMAL HIGH (ref 0.44–1.00)
GFR calc Af Amer: >60 mL/min (ref >60)
GFR calc non Af Amer: 60 mL/min — ABNORMAL LOW (ref >60)
Glucose, Bld: 90 mg/dL (ref 65–99)
Potassium: 3.3 mmol/L — ABNORMAL LOW (ref 3.5–5.1)
Sodium: 139 mmol/L (ref 135–145)

## 2016-11-06 LAB — POC URINE PREG, ED: Preg Test, Ur: NEGATIVE

## 2016-11-06 LAB — CBC
HCT: 26.7 % — ABNORMAL LOW (ref 35.0–47.0)
Hemoglobin: 8 g/dL — ABNORMAL LOW (ref 12.0–16.0)
MCH: 19.5 pg — ABNORMAL LOW (ref 26.0–34.0)
MCHC: 30 g/dL — ABNORMAL LOW (ref 32.0–36.0)
MCV: 64.9 fL — ABNORMAL LOW (ref 80.0–100.0)
Platelets: 176 10*3/uL (ref 150–440)
RBC: 4.12 MIL/uL (ref 3.80–5.20)
RDW: 18.7 % — ABNORMAL HIGH (ref 11.5–14.5)
WBC: 6 10*3/uL (ref 3.6–11.0)

## 2016-11-06 MED ORDER — CLONIDINE HCL 0.1 MG PO TABS
0.2000 mg | ORAL_TABLET | Freq: Once | ORAL | Status: AC
  Administered 2016-11-06: 0.2 mg via ORAL
  Filled 2016-11-06: qty 2

## 2016-11-06 MED ORDER — FERROUS SULFATE 325 (65 FE) MG PO TABS: 325.0000 mg | ORAL_TABLET | Freq: Every day | ORAL | 0 refills | Status: AC

## 2016-11-06 MED ORDER — HYDRALAZINE HCL 20 MG/ML IJ SOLN
10.0000 mg | Freq: Once | INTRAMUSCULAR | Status: AC
  Administered 2016-11-06: 10 mg via INTRAVENOUS
  Filled 2016-11-06: qty 1

## 2016-11-06 MED ORDER — CLONIDINE HCL 0.2 MG PO TABS: 0.2000 mg | ORAL_TABLET | Freq: Three times a day (TID) | ORAL | 0 refills | Status: DC | PRN

## 2016-11-06 NOTE — ED Provider Notes (Signed)
Time Seen: Approximately 1822  I have reviewed the triage notes  Chief Complaint: Abnormal Lab   History of Present Illness: [name] [name] is a 45 y.o. female who presents referred by her primary physician for evaluation of an outpatient lab of a hemoglobin of 7. Patient went to her primary mainly concerned over some feelings of fatigue that she's noticed over the last several weeks. She denies any chest pain or shortness of breath. She states she has a history of hypertension and states she has been taking her medications. She denies any current nausea, vomiting, syncopal episode, melena, hematochezia etc.   Past Medical History:  Diagnosis Date  . Breast cancer, left breast ([place]) 2000   Underwent lumpectomy, chemotherapy and radiation  . Hypertension   . Renal disorder     Patient Active Problem List   Diagnosis Date Noted  . Fibroids 08/02/2014    Past Surgical History:  Procedure Laterality Date  . BREAST LUMPECTOMY WITH AXILLARY LYMPH NODE BIOPSY Left 2000   biopsy    Past Surgical History:  Procedure Laterality Date  . BREAST LUMPECTOMY WITH AXILLARY LYMPH NODE BIOPSY Left 2000   biopsy    Current Outpatient Rx  . Order #: [id number] Class: Historical Med  . Order #: [id number] Class: Historical Med  . Order #: [id number] Class: Historical Med  . Order #: [id number] Class: Historical Med  . Order #: [id number] Class: Historical Med    Allergies:  Ace inhibitors  Family History: No family history on file.  Social History: Social History  Substance Use Topics  . Smoking status: Never Smoker  . Smokeless tobacco: Never Used  . Alcohol use No     Review of Systems:   10 point review of systems was performed and was otherwise negative:  Constitutional: No fever Eyes: No visual disturbances ENT: No sore throat, ear pain Cardiac: No chest pain Respiratory: No shortness of breath, wheezing, or stridor Abdomen: No abdominal pain, no vomiting, No  diarrhea Endocrine: No weight loss, No night sweats Extremities: No peripheral edema, cyanosis Skin: No rashes, easy bruising Neurologic: No focal weakness, trouble with speech or swollowing Urologic: No dysuria, Hematuria, or urinary frequency   Physical Exam:  ED Triage Vitals  Enc Vitals Group     BP 11/06/16 1726 (!) 250/114     Pulse Rate 11/06/16 1726 93     Resp 11/06/16 1726 16     Temp 11/06/16 1726 98.4 F (36.9 C)     Temp Source 11/06/16 1726 Oral     SpO2 11/06/16 1726 99 %     Weight 11/06/16 1730 191 lb (86.6 kg)     Height 11/06/16 1730 4\' 11"  (1.499 m)     Head Circumference --      Peak Flow --      Pain Score --      Pain Loc --      Pain Edu? --      Excl. in [place]? --     General: Awake , Alert , and Oriented times 3; GCS 15 Head: Normal cephalic , atraumatic Eyes: Pupils equal , round, reactive to light Nose/Throat: No nasal drainage, patent upper airway without erythema or exudate.  Neck: Supple, Full range of motion, No anterior adenopathy or palpable thyroid masses Lungs: Clear to ascultation without wheezes , rhonchi, or rales Heart: Regular rate, regular rhythm without murmurs , gallops , or rubs Abdomen: Soft, non tender without rebound, guarding , or rigidity; bowel sounds positive and symmetric in all 4  quadrants. No organomegaly .        Extremities: 2 plus symmetric pulses. No edema, clubbing or cyanosis Neurologic: normal ambulation, Motor symmetric without deficits, sensory intact Skin: warm, dry, no rashes Rectal exam with chaperone present was guaiac negative with normal sphincter tone is small external hemorrhoid tag. No masses noted in the rectal vault  Labs:   All laboratory work was reviewed including any pertinent negatives or positives listed below:  Labs Reviewed  CBC - Abnormal; Notable for the following:       Result Value   Hemoglobin 8.0 (*)    HCT 26.7 (*)    MCV 64.9 (*)    MCH 19.5 (*)    MCHC 30.0 (*)    RDW 18.7 (*)     All other components within normal limits  BASIC METABOLIC PANEL - Abnormal; Notable for the following:    Potassium 3.3 (*)    Creatinine, Ser 1.10 (*)    GFR calc non Af Amer 60 (*)    All other components within normal limits  POC URINE PREG, ED     ED Course:  Patient's stay here for is uneventful and her rectal exam shows no indications of GI bleed to explain her anemia. Review of her laboratory work appears to be either admitted or iron deficiency anemia. Patient was prescribed ferrous sulfate. I felt she did not require blood transfusion at this time. She's been advised drink plenty of fluids and acquired a good multivitamin and/or review what multivitamin she's on the make sure that she has adequate B vitamin coverage. She was advised to follow the instructions and the new medication for her blood pressure per her primary physician and I wrote her for clonidine to only be used as needed. Clinical Course      Assessment:  Anemia Hypertensive urgency      Plan:  Outpatient " New Prescriptions   CLONIDINE (CATAPRES) 0.2 MG TABLET    Take 1 tablet (0.2 mg total) by mouth 3 (three) times daily as needed.   FERROUS SULFATE 325 (65 FE) MG TABLET    Take 1 tablet (325 mg total) by mouth daily.  " Patient was advised to return immediately if condition worsens. Patient was advised to follow up with their primary care physician or other specialized physicians involved in their outpatient care. The patient and/or family member/power of attorney had laboratory results reviewed at the bedside. All questions and concerns were addressed and appropriate discharge instructions were distributed by the nursing staff.             Daymon Larsen, MD 11/06/16 2159

## 2016-11-06 NOTE — Discharge Instructions (Signed)
Please return immediately if condition worsens. Please contact her primary physician or the physician you were given for referral. If you have any specialist physicians involved in her treatment and plan please also contact them. Thank you for using [facility]. ° °

## 2016-11-06 NOTE — ED Triage Notes (Addendum)
Pt reports was told by her PCP that her hemoglobin was 7. Pt reports feeling fatigued lately but works and has children so she attributed it to that. Pt denies SOB. Pt reports has HTN and takes atenolol and her PCP has planned to increase her dosage. Pt alert and oriented in triage. Pt in no apparent distress.

## 2016-11-07 ENCOUNTER — Ambulatory Visit
Admission: RE | Admit: 2016-11-07 | Discharge: 2016-11-07 | Disposition: A | Discharge status: Home / Self Care | Payer: 59 | Source: Ambulatory Visit | Attending: Family Medicine | Admitting: Family Medicine

## 2016-11-07 ENCOUNTER — Other Ambulatory Visit: Payer: Self-pay | Admitting: Family Medicine

## 2016-11-07 DIAGNOSIS — R34 Anuria and oliguria: Secondary | ICD-10-CM

## 2016-11-11 ENCOUNTER — Other Ambulatory Visit: Payer: 59

## 2016-12-05 DIAGNOSIS — I10 Essential (primary) hypertension: Secondary | ICD-10-CM | POA: Diagnosis not present

## 2016-12-05 DIAGNOSIS — H10021 Other mucopurulent conjunctivitis, right eye: Secondary | ICD-10-CM | POA: Diagnosis not present

## 2016-12-05 DIAGNOSIS — D649 Anemia, unspecified: Secondary | ICD-10-CM | POA: Diagnosis not present

## 2016-12-05 DIAGNOSIS — R8290 Unspecified abnormal findings in urine: Secondary | ICD-10-CM | POA: Diagnosis not present

## 2017-01-23 DIAGNOSIS — I10 Essential (primary) hypertension: Secondary | ICD-10-CM | POA: Diagnosis not present

## 2017-01-28 ENCOUNTER — Encounter: Payer: Self-pay | Admitting: Cardiology

## 2017-01-28 ENCOUNTER — Ambulatory Visit (INDEPENDENT_AMBULATORY_CARE_PROVIDER_SITE_OTHER): Payer: 59 | Admitting: Cardiology

## 2017-01-28 VITALS — BP 172/110 | HR 54 | Ht 58.75 in | Wt 197.6 lb

## 2017-01-28 DIAGNOSIS — I10 Essential (primary) hypertension: Secondary | ICD-10-CM | POA: Diagnosis not present

## 2017-01-28 DIAGNOSIS — R011 Cardiac murmur, unspecified: Secondary | ICD-10-CM | POA: Diagnosis not present

## 2017-01-28 MED ORDER — NEBIVOLOL HCL 10 MG PO TABS: 10.0000 mg | ORAL_TABLET | Freq: Every day | ORAL | 11 refills | Status: DC

## 2017-01-28 NOTE — Patient Instructions (Signed)
Medication Instructions:  Please stop your Atenolol and start Bystolic 10 mg a day. Continue all other medications as listed.  Testing/Procedures: Your physician has requested that you have an echocardiogram. Echocardiography is a painless test that uses sound waves to create images of your heart. It provides your doctor with information about the size and shape of your heart and how well your heart's chambers and valves are working. This procedure takes approximately one hour. There are no restrictions for this procedure.  Follow-Up: Follow up in 2 weeks with Bonney Leitz, PA.  If you need a refill on your cardiac medications before your next appointment, please call your pharmacy.  Thank you for choosing [facility]!!

## 2017-01-28 NOTE — Progress Notes (Signed)
Cardiology Office Note    Date:  01/28/2017   ID:  [name] [name], DOB 1971-03-15, MRN [id number]  PCP:  Marda Stalker, PA-C  Cardiologist:   Candee Furbish, MD   Chief Complaint  Patient presents with  . New Patient (Initial Visit)    elevated htn     History of Present Illness:  [name] [name] is a 46 y.o. female here for the evaluation of difficult to control hypertension and the request of Marda Stalker, [place], Dr. Lynelle Doctor. Morbid obesity risk factor. She has a prior history of breast cancer in the year 2000.  In the past she has been able to control her hypertension with systolics in the 0:34 range. 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  One time at work, her blood pressure was as high as 742 systolic. She went over to her doctor's office at that time. No neurologic sequelae.  She is not having any chest pain, no shortness of breath, no orthopnea.  She has had angioedema from ACE inhibitor's in the past.  She has had some increased swelling with use of amlodipine although off of amlodipine at baseline she still has issues with lower extremity/pedal edema.  Her atenolol has been increased up to 100 mg and spironolactone HCTZ combination has been added as well.  In the past, her hemoglobin was Hg <7 , possible dysfunctional uterine bleeding from fibroids.   Father diabetic, no early family history of CAD  Past Medical History:  Diagnosis Date  . Breast cancer, left breast ([place]) 2000   Underwent lumpectomy, chemotherapy and radiation  . Hypertension   . Renal disorder     Past Surgical History:  Procedure Laterality Date  . BREAST LUMPECTOMY WITH AXILLARY LYMPH NODE BIOPSY Left 2000   biopsy    Current Medications: Outpatient Medications Prior to Visit  Medication Sig Dispense Refill  . ferrous sulfate  325 (65 FE) MG tablet Take 1 tablet (325 mg total) by mouth daily. 30 tablet 0  . ibuprofen (ADVIL,MOTRIN) 200 MG tablet Take 400 mg by mouth every 6 (six) hours as needed. For pain.     . IRON PO Take 1 tablet by mouth daily.      . Multiple Vitamin (MULITIVITAMIN WITH MINERALS) TABS Take 1 tablet by mouth daily.      Marland Kitchen atenolol (TENORMIN) 25 MG tablet Take 25 mg by mouth daily.      . cloNIDine (CATAPRES) 0.2 MG tablet Take 1 tablet (0.2 mg total) by mouth 3 (three) times daily as needed. (Patient not taking: Reported on 01/28/2017) 30 tablet 0  . hydrochlorothiazide (MICROZIDE) 12.5 MG capsule Take 12.5 mg by mouth daily.       No facility-administered medications prior to visit.      Allergies:   Ace inhibitors angioedema  Social History   Social History  . Marital status: Married    Spouse name: N/A  . Number of children: N/A  . Years of education: N/A   Social History Main Topics  . Smoking status: Never Smoker  . Smokeless tobacco: Never Used  . Alcohol use No  . Drug use: No  . Sexual activity: Yes    Partners: Male    Birth control/ protection: Surgical   Other Topics Concern  . Not on file   Social History Narrative  .  No narrative on file     Family History:  No early CAD   ROS:   Please see the history of present illness.    ROS All other systems reviewed and are negative.   PHYSICAL EXAM:   VS:  BP (!) 172/110   Pulse (!) 54   Ht 4' 10.75" (1.492 m)   Wt 197 lb 9.6 oz (89.6 kg)   BMI 40.25 kg/m    GEN: Well nourished, well developed, in no acute distress  HEENT: normal  Neck: no JVD, carotid bruits, or masses Cardiac: RRR; no murmurs, rubs, or gallops,no edema  Respiratory:  clear to auscultation bilaterally, normal work of breathing GI: soft, nontender, nondistended, + BS, overweight MS: no deformity or atrophy  Skin: warm and dry, no rash, pedal edema Neuro:  Alert and Oriented x 3, Strength and sensation are intact Psych: euthymic mood, full  affect  Wt Readings from Last 3 Encounters:  01/28/17 197 lb 9.6 oz (89.6 kg)  11/06/16 191 lb (86.6 kg)  08/02/14 211 lb 3.2 oz (95.8 kg)      Studies/Labs Reviewed:   EKG:  EKG is ordered today.  The ekg ordered today demonstrates 01/28/17-sinus bradycardia rate 54 rightward axis, T-wave inversion in 3, F, V3 through V6. Personally viewed  Recent Labs: 11/06/2016: BUN 18; Creatinine, Ser 1.10; Hemoglobin 8.0; Platelets 176; Potassium 3.3; Sodium 139   Lipid Panel No results found for: CHOL, TRIG, HDL, CHOLHDL, VLDL, LDLCALC, LDLDIRECT  Additional studies/ records that were reviewed today include:  Prior office notes, lab work, renal ultrasound-normal reviewed EKG reviewed    ASSESSMENT:    1. Uncontrolled hypertension   2. Murmur, cardiac   3. Morbid obesity ([place])      PLAN:  In order of problems listed above:  Difficult to control hypertension  - Stop atenolol 100 mg  - Start Bystolic 10 mg once a day  - Continue spironolactone 25 mg/HCTZ 12.5 mg  - Continue clonidine when necessary  - She will log her blood pressures at home.  - If a further agent is necessary, consider starting amlodipine at 2.5 mg which will help to minimize the edema  - Prior renal ultrasound was normal.  - In the past, she has had blood pressures that were able to be controlled in the 130 range.  - If blood pressure still continues to be elevated despite changes, we could increase the Bystolic to 20 mg and consider a dedicated renal artery ultrasound to try to look for any evidence of fibromuscular dysplasia.  - We will check an echocardiogram given her heart murmur  Heart murmur  - Check echocardiogram-could be flow murmur   Morbid obesity  - Encourage weight loss. Decrease carbohydrates. [place] [facility] has contacted her and will help her establish with a nutritionist.  We will see her back in clinic in 2 weeks with APP.   Medication Adjustments/Labs and Tests Ordered: Current  medicines are reviewed at length with the patient today.  Concerns regarding medicines are outlined above.  Medication changes, Labs and Tests ordered today are listed in the Patient Instructions below. Patient Instructions  Medication Instructions:  Please stop your Atenolol and start Bystolic 10 mg a day. Continue all other medications as listed.  Testing/Procedures: Your physician has requested that you have an echocardiogram. Echocardiography is a painless test that uses sound waves to create images of your heart. It provides your doctor with information about the size and shape of your heart and how well  your heart's chambers and valves are working. This procedure takes approximately one hour. There are no restrictions for this procedure.  Follow-Up: Follow up in 2 weeks with Bonney Leitz, PA.  If you need a refill on your cardiac medications before your next appointment, please call your pharmacy.  Thank you for choosing [facility]!!         Signed, Candee Furbish, MD  01/28/2017 3:53 PM    [address], [place],   [address] Phone: [phone]; Fax: [phone]

## 2017-02-13 ENCOUNTER — Ambulatory Visit (HOSPITAL_COMMUNITY): Payer: 59 | Attending: Cardiovascular Disease

## 2017-02-13 ENCOUNTER — Other Ambulatory Visit: Payer: Self-pay

## 2017-02-13 DIAGNOSIS — I5189 Other ill-defined heart diseases: Secondary | ICD-10-CM | POA: Diagnosis not present

## 2017-02-13 DIAGNOSIS — R011 Cardiac murmur, unspecified: Secondary | ICD-10-CM | POA: Diagnosis not present

## 2017-02-18 ENCOUNTER — Ambulatory Visit: Payer: 59 | Admitting: Physician Assistant

## 2017-02-20 ENCOUNTER — Ambulatory Visit: Payer: 59 | Admitting: Physician Assistant

## 2017-02-20 ENCOUNTER — Telehealth: Payer: Self-pay | Admitting: Physician Assistant

## 2017-02-20 NOTE — Telephone Encounter (Signed)
New Message     Please call with Echo results

## 2017-02-20 NOTE — Progress Notes (Deleted)
Cardiology Office Note    Date:  02/20/2017   ID:  [name] [name], DOB 02-08-1971, MRN [id number]  PCP:  Marda Stalker, PA-C  Cardiologist:  Dr. Marlou Porch  CC: follow up HTN  History of Present Illness:  [name] Leaman is a 46 y.o. female with a history of difficult to control hypertension, breast cancer s/p chemo/rad/lumpectomy, morbid obesity, fibroids with dysfunctional uterine bleeding who presents to clinic for follow up.   She has had angioedema from ACE inhibitor's in the past. She had worsening of chronic LE edema with amlodipine. Previous renal ultrasound was normal.  She was seen as a new patient by Dr. Marlou Porch on 01/28/17. Atenolol 100mg  daily was discontinued and Bystolic 10mg  daily was started. She was continued on spironolactone 25 mg/HCTZ 12.5 mg and PRN clonidine. Plan was to increase bystolic to 20mg  daily or add amlodipine at 2.5 mg which will help to minimize the edema if more BP control was needed. A murmur was heard on exam and 2D ECHO showed normal LV function (EF 75-80%) with severe LVH, G2DD, mod-severe LAE, mild RAE, PA pressure 43. The importance of getting her BP under control with severe LVH was emphasized.  Today he presents to clinic for follow up.     Past Medical History:  Diagnosis Date  . Breast cancer, left breast ([place]) 2000   Underwent lumpectomy, chemotherapy and radiation  . Hypertension   . Renal disorder     Past Surgical History:  Procedure Laterality Date  . BREAST LUMPECTOMY WITH AXILLARY LYMPH NODE BIOPSY Left 2000   biopsy    Current Medications: Outpatient Medications Prior to Visit  Medication Sig Dispense Refill  . cloNIDine (CATAPRES) 0.2 MG tablet Take 0.2 mg by mouth 3 (three) times daily as needed (high blood pressure).     . ferrous sulfate 325 (65 FE) MG tablet Take 1 tablet (325 mg total) by mouth daily. 30 tablet 0  . ibuprofen (ADVIL,MOTRIN) 200 MG tablet Take 400 mg by mouth every 6 (six) hours as needed. For pain.     .  IRON PO Take 1 tablet by mouth daily.      . Multiple Vitamin (MULITIVITAMIN WITH MINERALS) TABS Take 1 tablet by mouth daily.      . nebivolol (BYSTOLIC) 10 MG tablet Take 1 tablet (10 mg total) by mouth daily. 30 tablet 11  . spironolactone (ALDACTONE) 25 MG tablet Take 25 mg by mouth daily.     No facility-administered medications prior to visit.      Allergies:   Ace inhibitors   Social History   Social History  . Marital status: Married    Spouse name: N/A  . Number of children: N/A  . Years of education: N/A   Social History Main Topics  . Smoking status: Never Smoker  . Smokeless tobacco: Never Used  . Alcohol use No  . Drug use: No  . Sexual activity: Yes    Partners: Male    Birth control/ protection: Surgical   Other Topics Concern  . Not on file   Social History Narrative  . No narrative on file     Family History:  The patient's ***family history is not on file.      *** ROS/PE    Wt Readings from Last 3 Encounters:  01/28/17 197 lb 9.6 oz (89.6 kg)  11/06/16 191 lb (86.6 kg)  08/02/14 211 lb 3.2 oz (95.8 kg)      Studies/Labs Reviewed:   EKG:  EKG  is*** ordered today.  The ekg ordered today demonstrates ***  Recent Labs: 11/06/2016: BUN 18; Creatinine, Ser 1.10; Hemoglobin 8.0; Platelets 176; Potassium 3.3; Sodium 139   Lipid Panel No results found for: CHOL, TRIG, HDL, CHOLHDL, VLDL, LDLCALC, LDLDIRECT  Additional studies/ records that were reviewed today include:  2D ECHO 02/13/2017 LV EF: 75% -   80% Study Conclusions - Left ventricle: The cavity size was normal. Wall thickness was   increased in a pattern of severe LVH. Systolic function was   vigorous. The estimated ejection fraction was in the range of 75%   to 80%. Wall motion was normal; there were no regional wall   motion abnormalities. Features are consistent with a pseudonormal   left ventricular filling pattern, with concomitant abnormal   relaxation and increased filling  pressure (grade 2 diastolic   dysfunction). - Left atrium: The atrium was moderately to severely dilated. - Right atrium: The atrium was mildly dilated. - Pulmonary arteries: Systolic pressure was moderately increased.   PA peak pressure: 43 mm Hg (S).    ASSESSMENT & PLAN:   HTN:  Severe LVH:  Morbid obesity:   Medication Adjustments/Labs and Tests Ordered: Current medicines are reviewed at length with the patient today.  Concerns regarding medicines are outlined above.  Medication changes, Labs and Tests ordered today are listed in the Patient Instructions below. There are no Patient Instructions on file for this visit.   Signed, Angelena Form, PA-C  02/20/2017 9:12 AM    [facility] [address], [place], [place]  [address] Phone: [phone]; Fax: [phone]

## 2017-02-20 NOTE — Telephone Encounter (Signed)
Called patient with echo results. Patient requested an appointment to make up for the office visit she had to cancel today. Patient will see Dr. Marlou Porch on 03/18/17. Informed patient that she could discuss with Dr. Marlou Porch about her echocardiogram in more detail at her appointment. Encouraged patient that she needs to obtain normal blood pressures. Patient verbalized understanding.

## 2017-03-03 ENCOUNTER — Encounter: Payer: Self-pay | Admitting: Cardiology

## 2017-03-04 ENCOUNTER — Telehealth: Payer: Self-pay | Admitting: Cardiology

## 2017-03-04 NOTE — Telephone Encounter (Signed)
New message    Pt c/o BP issue: STAT if pt c/o blurred vision, one-sided weakness or slurred speech  1. What are your last 5 BP readings? 244/114  2. Are you having any other symptoms (ex. Dizziness, headache, blurred vision, passed out)? no  3. What is your BP issue? Woke up and blood pressure is extremely high requests a call back

## 2017-03-04 NOTE — Telephone Encounter (Signed)
Pt woke up this morning and BP was 244/114 at 0635.  [name] [name] and Bystolic at 0131.  BP at 0730 was 239/113, 0950 was 213/109.  Pt took Clonidine 0.2mg  at 0720, 0815, and 1000.  Advised pt Clonidine should not be taken that close together.  Pt had HA yesterday and today, jaw pain and right arm pain yesterday.  Denies nausea, dizziness, visual disturbances or jaw pain today.  BP is normally 130's/80's.  Pt states she has not had any energy yesterday or today.  Spoke with [name] Kicks, NP and she advised pt to go to ER.  Spoke with pt and made her aware of recommendation.  PT will go to [facility] for evaluation.  Will route to Dr. Marlou Porch to make him aware.

## 2017-03-16 ENCOUNTER — Emergency Department: Payer: 59

## 2017-03-16 ENCOUNTER — Observation Stay
Admission: EM | Admit: 2017-03-16 | Discharge: 2017-03-17 | Disposition: A | Discharge status: Home / Self Care | Payer: 59 | Attending: Internal Medicine | Admitting: Internal Medicine

## 2017-03-16 ENCOUNTER — Encounter: Payer: Self-pay | Admitting: Emergency Medicine

## 2017-03-16 DIAGNOSIS — G459 Transient cerebral ischemic attack, unspecified: Principal | ICD-10-CM | POA: Insufficient documentation

## 2017-03-16 DIAGNOSIS — Z79899 Other long term (current) drug therapy: Secondary | ICD-10-CM | POA: Diagnosis not present

## 2017-03-16 DIAGNOSIS — I129 Hypertensive chronic kidney disease with stage 1 through stage 4 chronic kidney disease, or unspecified chronic kidney disease: Secondary | ICD-10-CM | POA: Insufficient documentation

## 2017-03-16 DIAGNOSIS — N189 Chronic kidney disease, unspecified: Secondary | ICD-10-CM | POA: Diagnosis not present

## 2017-03-16 DIAGNOSIS — Z853 Personal history of malignant neoplasm of breast: Secondary | ICD-10-CM | POA: Insufficient documentation

## 2017-03-16 DIAGNOSIS — I10 Essential (primary) hypertension: Secondary | ICD-10-CM | POA: Diagnosis not present

## 2017-03-16 DIAGNOSIS — R200 Anesthesia of skin: Secondary | ICD-10-CM

## 2017-03-16 DIAGNOSIS — R29810 Facial weakness: Secondary | ICD-10-CM | POA: Diagnosis not present

## 2017-03-16 DIAGNOSIS — Z8673 Personal history of transient ischemic attack (TIA), and cerebral infarction without residual deficits: Secondary | ICD-10-CM | POA: Diagnosis present

## 2017-03-16 HISTORY — DX: Transient cerebral ischemic attack, unspecified: G45.9

## 2017-03-16 LAB — BASIC METABOLIC PANEL
Anion gap: 8 (ref 5–15)
BUN: 32 mg/dL — ABNORMAL HIGH (ref 6–20)
CO2: 25 mmol/L (ref 22–32)
Calcium: 10.1 mg/dL (ref 8.9–10.3)
Chloride: 104 mmol/L (ref 101–111)
Creatinine, Ser: 1.33 mg/dL — ABNORMAL HIGH (ref 0.44–1.00)
GFR calc Af Amer: 55 mL/min — ABNORMAL LOW (ref >60)
GFR calc non Af Amer: 47 mL/min — ABNORMAL LOW (ref >60)
Glucose, Bld: 136 mg/dL — ABNORMAL HIGH (ref 65–99)
Potassium: 3.5 mmol/L (ref 3.5–5.1)
Sodium: 137 mmol/L (ref 135–145)

## 2017-03-16 LAB — CBC WITH DIFFERENTIAL/PLATELET
Basophils Absolute: 0 10*3/uL (ref 0–0.1)
Basophils Relative: 1 %
Eosinophils Absolute: 0.1 10*3/uL (ref 0–0.7)
Eosinophils Relative: 1 %
HCT: 37.9 % (ref 35.0–47.0)
Hemoglobin: 12.3 g/dL (ref 12.0–16.0)
Lymphocytes Relative: 29 %
Lymphs Abs: 1.8 10*3/uL (ref 1.0–3.6)
MCH: 25.2 pg — ABNORMAL LOW (ref 26.0–34.0)
MCHC: 32.5 g/dL (ref 32.0–36.0)
MCV: 77.5 fL — ABNORMAL LOW (ref 80.0–100.0)
Monocytes Absolute: 0.8 10*3/uL (ref 0.2–0.9)
Monocytes Relative: 13 %
Neutro Abs: 3.5 10*3/uL (ref 1.4–6.5)
Neutrophils Relative %: 56 %
Platelets: 194 10*3/uL (ref 150–440)
RBC: 4.9 MIL/uL (ref 3.80–5.20)
RDW: 16.2 % — ABNORMAL HIGH (ref 11.5–14.5)
WBC: 6.2 10*3/uL (ref 3.6–11.0)

## 2017-03-16 LAB — TROPONIN I: Troponin I: <0.03 ng/mL (ref <0.03)

## 2017-03-16 LAB — GLUCOSE, CAPILLARY: Glucose-Capillary: 127 mg/dL — ABNORMAL HIGH (ref 65–99)

## 2017-03-16 MED ORDER — ASPIRIN 81 MG PO CHEW
324.0000 mg | CHEWABLE_TABLET | Freq: Once | ORAL | Status: AC
  Administered 2017-03-17: 324 mg via ORAL
  Filled 2017-03-16: qty 4

## 2017-03-16 MED ORDER — IOPAMIDOL (ISOVUE-370) INJECTION 76%
75.0000 mL | Freq: Once | INTRAVENOUS | Status: AC | PRN
  Administered 2017-03-16: 75 mL via INTRAVENOUS

## 2017-03-16 MED ORDER — DIPHENHYDRAMINE HCL 50 MG/ML IJ SOLN
25.0000 mg | Freq: Once | INTRAMUSCULAR | Status: AC
  Administered 2017-03-16: 25 mg via INTRAVENOUS
  Filled 2017-03-16: qty 1

## 2017-03-16 MED ORDER — PROCHLORPERAZINE EDISYLATE 5 MG/ML IJ SOLN
10.0000 mg | INTRAMUSCULAR | Status: DC | PRN
  Administered 2017-03-16: 10 mg via INTRAVENOUS
  Filled 2017-03-16: qty 2

## 2017-03-16 MED ORDER — SODIUM CHLORIDE 0.9 % IV BOLUS (SEPSIS)
1000.0000 mL | Freq: Once | INTRAVENOUS | Status: AC
  Administered 2017-03-16: 1000 mL via INTRAVENOUS

## 2017-03-16 NOTE — ED Provider Notes (Addendum)
[facility] Emergency Department Provider Note    First MD Initiated Contact with Patient 03/16/17 2136     (approximate)  I have reviewed the triage vital signs and the nursing notes.   HISTORY  Chief Complaint Numbness and Code Stroke    HPI [name] [name] is a 46 y.o. female presents with chief complaint of right facial numbness and tingling that started roughly 45 minutes prior to arrival. Patient does have a history of elevated blood pressure as well as headaches. Describes this as a "strike like lightning to the right side of her head". No associated nausea or vomiting. No other associated numbness or tingling.  No neck stiffness.  No blurry vision.  No previous h/o stroke.     Past Medical History:  Diagnosis Date  . Breast cancer, left breast ([place]) 2000   Underwent lumpectomy, chemotherapy and radiation  . Hypertension   . Renal disorder    No family history on file. Past Surgical History:  Procedure Laterality Date  . BREAST LUMPECTOMY WITH AXILLARY LYMPH NODE BIOPSY Left 2000   biopsy   Patient Active Problem List   Diagnosis Date Noted  . Fibroids 08/02/2014      Prior to Admission medications   Medication Sig Start Date End Date Taking? Authorizing Provider  cloNIDine (CATAPRES) 0.2 MG tablet Take 0.2 mg by mouth 3 (three) times daily as needed (high blood pressure).    Yes Historical Provider, MD  ferrous sulfate 325 (65 FE) MG tablet Take 1 tablet (325 mg total) by mouth daily. 11/06/16 11/06/17 Yes Daymon Larsen, MD  ibuprofen (ADVIL,MOTRIN) 200 MG tablet Take 400 mg by mouth every 6 (six) hours as needed. For pain.    Yes Historical Provider, MD  Multiple Vitamin (MULITIVITAMIN WITH MINERALS) TABS Take 1 tablet by mouth daily.     Yes Historical Provider, MD  nebivolol (BYSTOLIC) 10 MG tablet Take 1 tablet (10 mg total) by mouth daily. 01/28/17  Yes Jerline Pain, MD  spironolactone (ALDACTONE) 25 MG tablet Take 25 mg by mouth  daily.   Yes Historical Provider, MD    Allergies Ace inhibitors    Social History Social History  Substance Use Topics  . Smoking status: Never Smoker  . Smokeless tobacco: Never Used  . Alcohol use No    Review of Systems Patient denies headaches, rhinorrhea, blurry vision, numbness, shortness of breath, chest pain, edema, cough, abdominal pain, nausea, vomiting, diarrhea, dysuria, fevers, rashes or hallucinations unless otherwise stated above in HPI. ____________________________________________   PHYSICAL EXAM:  VITAL SIGNS: Vitals:   03/16/17 2146  BP: (!) 216/104  Pulse: 93  Resp: 18  Temp: 98.5 F (36.9 C)    Constitutional: Alert and oriented. anxious appearing and in no acute distress. Eyes: Conjunctivae are normal. PERRL. EOMI. Head: Atraumatic. Nose: No congestion/rhinnorhea. Mouth/Throat: Mucous membranes are moist.  Oropharynx non-erythematous. Neck: No stridor. Painless ROM. No cervical spine tenderness to palpation Hematological/Lymphatic/Immunilogical: No cervical lymphadenopathy. Cardiovascular: Normal rate, regular rhythm. Grossly normal heart sounds.  Good peripheral circulation. Respiratory: Normal respiratory effort.  No retractions. Lungs CTAB. Gastrointestinal: Soft and nontender. No distention. No abdominal bruits. No CVA tenderness. Musculoskeletal: No lower extremity tenderness nor edema.  No joint effusions. Neurologic:  CN- intact.  No facial droop, Normal FNF.  Normal heel to shin.  Sensation intact bilaterally. Normal speech and language. No gross focal neurologic deficits are appreciated. No gait instability. Skin:  Skin is warm, dry and intact. No rash noted. Psychiatric:  Mood and affect are normal. Speech and behavior are normal.  ____________________________________________   LABS (all labs ordered are listed, but only abnormal results are displayed)  Results for orders placed or performed during the hospital encounter of 03/16/17  (from the past 24 hour(s))  Glucose, capillary     Status: Abnormal   Collection Time: 03/16/17  9:46 PM  Result Value Ref Range   Glucose-Capillary 127 (H) 65 - 99 mg/dL  CBC with Differential/Platelet     Status: Abnormal   Collection Time: 03/16/17 10:07 PM  Result Value Ref Range   WBC 6.2 3.6 - 11.0 K/uL   RBC 4.90 3.80 - 5.20 MIL/uL   Hemoglobin 12.3 12.0 - 16.0 g/dL   HCT 37.9 35.0 - 47.0 %   MCV 77.5 (L) 80.0 - 100.0 fL   MCH 25.2 (L) 26.0 - 34.0 pg   MCHC 32.5 32.0 - 36.0 g/dL   RDW 16.2 (H) 11.5 - 14.5 %   Platelets 194 150 - 440 K/uL   Neutrophils Relative % 56 %   Neutro Abs 3.5 1.4 - 6.5 K/uL   Lymphocytes Relative 29 %   Lymphs Abs 1.8 1.0 - 3.6 K/uL   Monocytes Relative 13 %   Monocytes Absolute 0.8 0.2 - 0.9 K/uL   Eosinophils Relative 1 %   Eosinophils Absolute 0.1 0 - 0.7 K/uL   Basophils Relative 1 %   Basophils Absolute 0.0 0 - 0.1 K/uL  Basic metabolic panel     Status: Abnormal   Collection Time: 03/16/17 10:07 PM  Result Value Ref Range   Sodium 137 135 - 145 mmol/L   Potassium 3.5 3.5 - 5.1 mmol/L   Chloride 104 101 - 111 mmol/L   CO2 25 22 - 32 mmol/L   Glucose, Bld 136 (H) 65 - 99 mg/dL   BUN 32 (H) 6 - 20 mg/dL   Creatinine, Ser 1.33 (H) 0.44 - 1.00 mg/dL   Calcium 10.1 8.9 - 10.3 mg/dL   GFR calc non Af Amer 47 (L) >60 mL/min   GFR calc Af Amer 55 (L) >60 mL/min   Anion gap 8 5 - 15  Troponin I     Status: None   Collection Time: 03/16/17 10:07 PM  Result Value Ref Range   Troponin I <0.03 <0.03 ng/mL   ____________________________________________  EKG My review and personal interpretation at Time: 21:47   Indication: numbness  Rate: 90  Rhythm: sinus Axis: normal  Other: normal intervals, non specifc st changes in inferolateral distribution ____________________________________________  RADIOLOGY  I personally reviewed all radiographic images ordered to evaluate for the above acute complaints and reviewed radiology reports and  findings.  These findings were personally discussed with the patient.  Please see medical record for radiology report.  ____________________________________________   PROCEDURES  Procedure(s) performed:  Procedures    Critical Care performed: no ____________________________________________   INITIAL IMPRESSION / ASSESSMENT AND PLAN / ED COURSE  Pertinent labs & imaging results that were available during my care of the patient were reviewed by me and considered in my medical decision making (see chart for details).  DDX: cva, tia, hypoglycemia, dehydration, electrolyte abnormality, dissection, sepsis   [name] Tetro is a 46 y.o. who presents to the ED with right facial numbness and tingling as described above. No objective neurodeficits. Patient states that simply just feels different on the right side. She is afebrile she is hypertensive but in no acute distress. Denies any chest pain. Glucose is normal. Stat CT  head shows no acute abnormality. This is not consistent with subarachnoid. Possible complex migraine but the patient denies any significant discomfort. Patient does have risk factors for CVA therefore a CTA ordered to evaluate for vascular abnormality.  Based on her presentation do feel patient would benefit from admission hospital for TIA workup.  Spoke with Dr. Ara Kussmaul who agrees to admit patient for further evaluation and management.        ____________________________________________   FINAL CLINICAL IMPRESSION(S) / ED DIAGNOSES  Final diagnoses:  Right facial numbness  Hypertension, unspecified type      NEW MEDICATIONS STARTED DURING THIS VISIT:  New Prescriptions   No medications on file     Note:  This document was prepared using Dragon voice recognition software and may include unintentional dictation errors.    Merlyn Lot, MD 03/16/17 2354    Merlyn Lot, MD 03/17/17 Dyann Kief

## 2017-03-16 NOTE — ED Notes (Signed)
Patient reports that the numbness to her right face has resolved at this time. Patient resting quietly in NAD.

## 2017-03-16 NOTE — ED Notes (Signed)
Patient transported to CT 

## 2017-03-16 NOTE — ED Notes (Signed)
Pt to stat desk with co right side facial numbness and noted right side facial weakness. Pt reports sx for about 45 mins. Orders obtained for head ct from Dr Quentin Cornwall, [place] notified and pt taken to CT

## 2017-03-16 NOTE — ED Triage Notes (Signed)
Patient states that about around 21:00 tonight she felt a sharpe pain to her head and then developed numbness to the right side of her face.

## 2017-03-17 ENCOUNTER — Observation Stay: Payer: 59

## 2017-03-17 ENCOUNTER — Observation Stay (HOSPITAL_BASED_OUTPATIENT_CLINIC_OR_DEPARTMENT_OTHER)
Admit: 2017-03-17 | Discharge: 2017-03-17 | Disposition: A | Discharge status: Home / Self Care | Payer: 59 | Attending: Family Medicine | Admitting: Family Medicine

## 2017-03-17 DIAGNOSIS — I10 Essential (primary) hypertension: Secondary | ICD-10-CM | POA: Diagnosis not present

## 2017-03-17 DIAGNOSIS — R200 Anesthesia of skin: Secondary | ICD-10-CM

## 2017-03-17 DIAGNOSIS — Z8673 Personal history of transient ischemic attack (TIA), and cerebral infarction without residual deficits: Secondary | ICD-10-CM | POA: Diagnosis present

## 2017-03-17 DIAGNOSIS — N189 Chronic kidney disease, unspecified: Secondary | ICD-10-CM | POA: Diagnosis not present

## 2017-03-17 DIAGNOSIS — G459 Transient cerebral ischemic attack, unspecified: Secondary | ICD-10-CM | POA: Diagnosis not present

## 2017-03-17 LAB — LIPID PANEL
Cholesterol: 126 mg/dL (ref 0–200)
HDL: 49 mg/dL (ref >40)
LDL Cholesterol: 67 mg/dL (ref 0–99)
Total CHOL/HDL Ratio: 2.6 RATIO
Triglycerides: 49 mg/dL (ref <150)
VLDL: 10 mg/dL (ref 0–40)

## 2017-03-17 LAB — ECHOCARDIOGRAM COMPLETE
Height: 59 in
Weight: 3208 oz

## 2017-03-17 LAB — URINE DRUG SCREEN, QUALITATIVE (ARMC ONLY)
Amphetamines, Ur Screen: NOT DETECTED
Barbiturates, Ur Screen: NOT DETECTED
Benzodiazepine, Ur Scrn: NOT DETECTED
Cannabinoid 50 Ng, Ur ~~LOC~~: NOT DETECTED
Cocaine Metabolite,Ur ~~LOC~~: NOT DETECTED
MDMA (Ecstasy)Ur Screen: NOT DETECTED
Methadone Scn, Ur: NOT DETECTED
Opiate, Ur Screen: NOT DETECTED
Phencyclidine (PCP) Ur S: NOT DETECTED
Tricyclic, Ur Screen: NOT DETECTED

## 2017-03-17 LAB — C-REACTIVE PROTEIN: CRP: <0.8 mg/dL (ref <1.0)

## 2017-03-17 LAB — URINALYSIS, DIPSTICK ONLY
Bilirubin Urine: NEGATIVE
Glucose, UA: NEGATIVE mg/dL
Hgb urine dipstick: NEGATIVE
Ketones, ur: NEGATIVE mg/dL
Leukocytes, UA: NEGATIVE
Nitrite: NEGATIVE
Protein, ur: NEGATIVE mg/dL
Specific Gravity, Urine: 1.031 — ABNORMAL HIGH (ref 1.005–1.030)
pH: 5 (ref 5.0–8.0)

## 2017-03-17 LAB — TROPONIN I
Troponin I: <0.03 ng/mL (ref <0.03)
Troponin I: <0.03 ng/mL (ref <0.03)
Troponin I: <0.03 ng/mL (ref <0.03)

## 2017-03-17 LAB — SEDIMENTATION RATE: Sed Rate: 30 mm/hr — ABNORMAL HIGH (ref 0–20)

## 2017-03-17 MED ORDER — ACETAMINOPHEN 160 MG/5ML PO SOLN
650.0000 mg | ORAL | Status: DC | PRN
  Filled 2017-03-17: qty 20.3

## 2017-03-17 MED ORDER — ASPIRIN EC 81 MG PO TBEC
81.0000 mg | DELAYED_RELEASE_TABLET | Freq: Every day | ORAL | Status: DC
  Administered 2017-03-17: 81 mg via ORAL
  Filled 2017-03-17: qty 1

## 2017-03-17 MED ORDER — STROKE: EARLY STAGES OF RECOVERY BOOK
Freq: Once | Status: AC
  Administered 2017-03-17: 04:00:00

## 2017-03-17 MED ORDER — SENNOSIDES-DOCUSATE SODIUM 8.6-50 MG PO TABS: 1.0000 | ORAL_TABLET | Freq: Every evening | ORAL | Status: DC | PRN

## 2017-03-17 MED ORDER — FERROUS SULFATE 325 (65 FE) MG PO TABS
325.0000 mg | ORAL_TABLET | Freq: Every day | ORAL | Status: DC
  Administered 2017-03-17: 325 mg via ORAL
  Filled 2017-03-17: qty 1

## 2017-03-17 MED ORDER — SODIUM CHLORIDE 0.9 % IV SOLN
INTRAVENOUS | Status: DC
  Administered 2017-03-17: 04:00:00 via INTRAVENOUS

## 2017-03-17 MED ORDER — ENOXAPARIN SODIUM 40 MG/0.4ML ~~LOC~~ SOLN
40.0000 mg | SUBCUTANEOUS | Status: DC
  Administered 2017-03-17: 40 mg via SUBCUTANEOUS
  Filled 2017-03-17: qty 0.4

## 2017-03-17 MED ORDER — ASPIRIN 81 MG PO TBEC: 81.0000 mg | DELAYED_RELEASE_TABLET | Freq: Every day | ORAL | 0 refills | Status: DC

## 2017-03-17 MED ORDER — ACETAMINOPHEN 325 MG PO TABS: 650.0000 mg | ORAL_TABLET | ORAL | Status: DC | PRN

## 2017-03-17 MED ORDER — ACETAMINOPHEN 650 MG RE SUPP: 650.0000 mg | RECTAL | Status: DC | PRN

## 2017-03-17 NOTE — Consult Note (Addendum)
Referring Physician: Anselm Jungling    Chief Complaint: Right facial numbness  HPI: [name] [name] is an 46 y.o. female with a known history of breast cancer, HTN, CKD was in a usual state of health until yesterday evening when she experienced a shooting sensation that went from the back of her head to her cheek.  Her daughter felt she had some facial weakness as well.  BP was elevated.  Patient presented for further evaluation.  Initial NIHSS of 0. No change in taste, change in hearing or excessive tearing.  Date last known well: Date: 03/16/2017 Time last known well: Time: 20:40 tPA Given: No: Minimal symptoms  Past Medical History:  Diagnosis Date  . Breast cancer, left breast ([place]) 2000   Underwent lumpectomy, chemotherapy and radiation  . Hypertension   . Renal disorder     Past Surgical History:  Procedure Laterality Date  . BREAST LUMPECTOMY WITH AXILLARY LYMPH NODE BIOPSY Left 2000   biopsy    Family history: Daughter alive and well.  No family history of breast cancer  Social History:  reports that she has never smoked. She has never used smokeless tobacco. She reports that she does not drink alcohol or use drugs.  Allergies:  Allergies  Allergen Reactions  . Ace Inhibitors Swelling    Medications:  I have reviewed the patient's current medications. Prior to Admission:  Prescriptions Prior to Admission  Medication Sig Dispense Refill Last Dose  . cloNIDine (CATAPRES) 0.2 MG tablet Take 0.2 mg by mouth 3 (three) times daily as needed (high blood pressure).    prn  . ferrous sulfate 325 (65 FE) MG tablet Take 1 tablet (325 mg total) by mouth daily. 30 tablet 0 03/16/2017 at Unknown time  . ibuprofen (ADVIL,MOTRIN) 200 MG tablet Take 400 mg by mouth every 6 (six) hours as needed. For pain.    prn  . Multiple Vitamin (MULITIVITAMIN WITH MINERALS) TABS Take 1 tablet by mouth daily.     03/16/2017 at Unknown time  . nebivolol (BYSTOLIC) 10 MG tablet Take 1 tablet (10 mg total)  by mouth daily. 30 tablet 11 03/16/2017 at Unknown time  . spironolactone (ALDACTONE) 25 MG tablet Take 25 mg by mouth daily.   03/16/2017 at Unknown time   Scheduled: . aspirin EC  81 mg Oral Daily  . enoxaparin (LOVENOX) injection  40 mg Subcutaneous Q24H  . ferrous sulfate  325 mg Oral Daily    ROS: History obtained from the patient  General ROS: negative for - chills, fatigue, fever, night sweats, weight gain or weight loss Psychological ROS: negative for - behavioral disorder, hallucinations, memory difficulties, mood swings or suicidal ideation Ophthalmic ROS: negative for - blurry vision, double vision, eye pain or loss of vision ENT ROS: negative for - epistaxis, nasal discharge, oral lesions, sore throat, tinnitus or vertigo Allergy and Immunology ROS: negative for - hives or itchy/watery eyes Hematological and Lymphatic ROS: negative for - bleeding problems, bruising or swollen lymph nodes Endocrine ROS: negative for - galactorrhea, hair pattern changes, polydipsia/polyuria or temperature intolerance Respiratory ROS: negative for - cough, hemoptysis, shortness of breath or wheezing Cardiovascular ROS: negative for - chest pain, dyspnea on exertion, edema or irregular heartbeat Gastrointestinal ROS: negative for - abdominal pain, diarrhea, hematemesis, nausea/vomiting or stool incontinence Genito-Urinary ROS: negative for - dysuria, hematuria, incontinence or urinary frequency/urgency Musculoskeletal ROS: negative for - joint swelling or muscular weakness Neurological ROS: as noted in HPI, RUE numbness present for quite some time Dermatological ROS: negative  for rash and skin lesion changes  Physical Examination: Blood pressure 134/87, pulse (!) 57, temperature 98 F (36.7 C), temperature source Oral, resp. rate 18, height '4\' 11"'$  (1.499 m), weight 90.9 kg (200 lb 8 oz), last menstrual period 12/16/2016, SpO2 98 %.  HEENT-  Normocephalic, no lesions, without obvious abnormality.   Normal external eye and conjunctiva.  Normal TM's bilaterally.  Normal auditory canals and external ears. Normal external nose, mucus membranes and septum.  Normal pharynx. Cardiovascular- S1, S2 normal, pulses palpable throughout   Lungs- chest clear, no wheezing, rales, normal symmetric air entry Abdomen- soft, non-tender; bowel sounds normal; no masses,  no organomegaly Extremities- LE edema Lymph-no adenopathy palpable Musculoskeletal-no joint tenderness, deformity or swelling Skin-warm and dry, no hyperpigmentation, vitiligo, or suspicious lesions  Neurological Examination   Mental Status: Alert, oriented, thought content appropriate.  Speech fluent without evidence of aphasia.  Able to follow 3 step commands without difficulty. Cranial Nerves: II: Discs flat bilaterally; Visual fields grossly normal, pupils equal, round, reactive to light and accommodation III,IV, VI: ptosis not present, extra-ocular motions intact bilaterally V,VII: mild right facial droop, facial light touch sensation decreased on the right VIII: hearing normal bilaterally IX,X: gag reflex present XI: bilateral shoulder shrug XII: midline tongue extension Motor: Right : Upper extremity   5/5    Left:     Upper extremity   5/5  Lower extremity   5/5     Lower extremity   5/5 Tone and bulk:normal tone throughout; no atrophy noted Sensory: Pinprick and light touch decreased in the RUE Deep Tendon Reflexes: 2+ and symmetric throughout Plantars: Right: downgoing   Left: downgoing Cerebellar: normal finger-to-nose, normal rapid alternating movements and normal heel-to-shin test Gait: normal gait and station    Laboratory Studies:  Basic Metabolic Panel:  Recent Labs Lab 03/16/17 2207  NA 137  K 3.5  CL 104  CO2 25  GLUCOSE 136*  BUN 32*  CREATININE 1.33*  CALCIUM 10.1    Liver Function Tests: No results for input(s): AST, ALT, ALKPHOS, BILITOT, PROT, ALBUMIN in the last 168 hours. No results for  input(s): LIPASE, AMYLASE in the last 168 hours. No results for input(s): AMMONIA in the last 168 hours.  CBC:  Recent Labs Lab 03/16/17 2207  WBC 6.2  NEUTROABS 3.5  HGB 12.3  HCT 37.9  MCV 77.5*  PLT 194    Cardiac Enzymes:  Recent Labs Lab 03/16/17 2207 03/17/17 0410 03/17/17 0903  TROPONINI <0.03 <0.03 <0.03    BNP: Invalid input(s): POCBNP  CBG:  Recent Labs Lab 03/16/17 2146  GLUCAP 127*    Microbiology: No results found for this or any previous visit.  Coagulation Studies: No results for input(s): LABPROT, INR in the last 72 hours.  Urinalysis:  Recent Labs Lab 03/17/17 0600  COLORURINE STRAW*  LABSPEC 1.031*  PHURINE 5.0  GLUCOSEU NEGATIVE  HGBUR NEGATIVE  BILIRUBINUR NEGATIVE  KETONESUR NEGATIVE  PROTEINUR NEGATIVE  NITRITE NEGATIVE  LEUKOCYTESUR NEGATIVE    Lipid Panel:    Component Value Date/Time   CHOL 126 03/17/2017 0410   TRIG 49 03/17/2017 0410   HDL 49 03/17/2017 0410   CHOLHDL 2.6 03/17/2017 0410   VLDL 10 03/17/2017 0410   LDLCALC 67 03/17/2017 0410    HgbA1C: No results found for: HGBA1C  Urine Drug Screen:     Component Value Date/Time   LABOPIA NONE DETECTED 03/17/2017 0600   COCAINSCRNUR NONE DETECTED 03/17/2017 0600   LABBENZ NONE DETECTED 03/17/2017 0600  AMPHETMU NONE DETECTED 03/17/2017 0600   THCU NONE DETECTED 03/17/2017 0600   LABBARB NONE DETECTED 03/17/2017 0600    Alcohol Level: No results for input(s): ETH in the last 168 hours.  Other results: EKG: sinus rhythm at 93 bpm.  Imaging: Ct Angio Head W Or Wo Contrast  Result Date: 03/16/2017 CLINICAL DATA:  Right-sided facial numbness EXAM: CT ANGIOGRAPHY HEAD AND NECK TECHNIQUE: Multidetector CT imaging of the head and neck was performed using the standard protocol during bolus administration of intravenous contrast. Multiplanar CT image reconstructions and MIPs were obtained to evaluate the vascular anatomy. Carotid stenosis measurements (when  applicable) are obtained utilizing NASCET criteria, using the distal internal carotid diameter as the denominator. CONTRAST:  75 mL Isovue 370 COMPARISON:  Head CT 03/16/2017 FINDINGS: CTA NECK FINDINGS Aortic arch: There is no aneurysm or dissection of the visualized ascending aorta or aortic arch. There is a normal 3 vessel branching pattern. The visualized proximal subclavian arteries are normal. Right carotid system: The right common carotid origin is widely patent. There is no common carotid or internal carotid artery dissection or aneurysm. No hemodynamically significant stenosis. Left carotid system: The left common carotid origin is widely patent. There is no common carotid or internal carotid artery dissection or aneurysm. No hemodynamically significant stenosis. Vertebral arteries: The vertebral system is left dominant. Both vertebral artery origins are normal. The right vertebral artery is diminutive along its entire course. Left vertebral artery is normal to the vertebrobasilar confluence. Skeleton: There is no bony spinal canal stenosis. No lytic or blastic lesions. Other neck: The nasopharynx is clear. The oropharynx and hypopharynx are normal. The epiglottis is normal. The supraglottic larynx, glottis and subglottic larynx are normal. No retropharyngeal collection. The parapharyngeal spaces are preserved. The parotid and submandibular glands are normal. No sialolithiasis or salivary ductal dilatation. The thyroid gland is normal. There is no cervical lymphadenopathy. Upper chest: No pneumothorax or pleural effusion. No nodules or masses. Review of the MIP images confirms the above findings CTA HEAD FINDINGS Anterior circulation: --Intracranial internal carotid arteries: Normal. --Anterior cerebral arteries: Normal. --Middle cerebral arteries: Normal. --Posterior communicating arteries: Present bilaterally. Posterior circulation: --Posterior cerebral arteries: There is a fetal origin of the right  posterior cerebral artery. Normal left posterior cerebral artery. --Superior cerebellar arteries: Normal. --Basilar artery: Normal. --Anterior inferior cerebellar arteries: Not clearly seen on the left. Normal on the right. --Posterior inferior cerebellar arteries: Left dominant. Venous sinuses: As permitted by contrast timing, patent. Anatomic variants: Fetal origin of the right posterior cerebral artery, a normal variant. Delayed phase: No parenchymal contrast enhancement. Review of the MIP images confirms the above findings IMPRESSION: 1. Normal intracranial CTA. 2. No carotid dissection, aneurysm or stenosis. 3. 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 Electronically Signed   By: Ulyses Jarred M.D.   On: 03/16/2017 23:59   Ct Head Wo Contrast  Addendum Date: 03/16/2017   ADDENDUM REPORT: 03/16/2017 21:56 ADDENDUM: Study discussed by telephone with Dr. Merlyn Lot on 03/16/2017 at 0953 hours. Electronically Signed   By: Genevie Ann M.D.   On: 03/16/2017 21:56   Result Date: 03/16/2017 CLINICAL DATA:  46 year old female right side facial weakness for 45 minutes. Initial encounter. EXAM: CT HEAD WITHOUT CONTRAST TECHNIQUE: Contiguous axial images were obtained from the base of the skull through the vertex without intravenous contrast. COMPARISON:  None. FINDINGS: Brain: Cerebral volume is normal. No midline shift, ventriculomegaly, mass effect, evidence of mass lesion, intracranial hemorrhage or evidence of cortically  based acute infarction. Gray-white matter differentiation is within normal limits throughout the brain. Vascular: No suspicious intracranial vascular hyperdensity. Skull: Normal CT appearance of the stylomastoid foramina. No osseous abnormality identified. Sinuses/Orbits: Clear. Other: Negative visible orbit and scalp soft tissues. IMPRESSION: Normal noncontrast CT appearance of the brain. Electronically Signed: By: Odessa Fleming M.D. On: 03/16/2017 21:51   Ct  Angio Neck W And/or Wo Contrast  Result Date: 03/16/2017 CLINICAL DATA:  Right-sided facial numbness EXAM: CT ANGIOGRAPHY HEAD AND NECK TECHNIQUE: Multidetector CT imaging of the head and neck was performed using the standard protocol during bolus administration of intravenous contrast. Multiplanar CT image reconstructions and MIPs were obtained to evaluate the vascular anatomy. Carotid stenosis measurements (when applicable) are obtained utilizing NASCET criteria, using the distal internal carotid diameter as the denominator. CONTRAST:  75 mL Isovue 370 COMPARISON:  Head CT 03/16/2017 FINDINGS: CTA NECK FINDINGS Aortic arch: There is no aneurysm or dissection of the visualized ascending aorta or aortic arch. There is a normal 3 vessel branching pattern. The visualized proximal subclavian arteries are normal. Right carotid system: The right common carotid origin is widely patent. There is no common carotid or internal carotid artery dissection or aneurysm. No hemodynamically significant stenosis. Left carotid system: The left common carotid origin is widely patent. There is no common carotid or internal carotid artery dissection or aneurysm. No hemodynamically significant stenosis. Vertebral arteries: The vertebral system is left dominant. Both vertebral artery origins are normal. The right vertebral artery is diminutive along its entire course. Left vertebral artery is normal to the vertebrobasilar confluence. Skeleton: There is no bony spinal canal stenosis. No lytic or blastic lesions. Other neck: The nasopharynx is clear. The oropharynx and hypopharynx are normal. The epiglottis is normal. The supraglottic larynx, glottis and subglottic larynx are normal. No retropharyngeal collection. The parapharyngeal spaces are preserved. The parotid and submandibular glands are normal. No sialolithiasis or salivary ductal dilatation. The thyroid gland is normal. There is no cervical lymphadenopathy. Upper chest: No  pneumothorax or pleural effusion. No nodules or masses. Review of the MIP images confirms the above findings CTA HEAD FINDINGS Anterior circulation: --Intracranial internal carotid arteries: Normal. --Anterior cerebral arteries: Normal. --Middle cerebral arteries: Normal. --Posterior communicating arteries: Present bilaterally. Posterior circulation: --Posterior cerebral arteries: There is a fetal origin of the right posterior cerebral artery. Normal left posterior cerebral artery. --Superior cerebellar arteries: Normal. --Basilar artery: Normal. --Anterior inferior cerebellar arteries: Not clearly seen on the left. Normal on the right. --Posterior inferior cerebellar arteries: Left dominant. Venous sinuses: As permitted by contrast timing, patent. Anatomic variants: Fetal origin of the right posterior cerebral artery, a normal variant. Delayed phase: No parenchymal contrast enhancement. Review of the MIP images confirms the above findings IMPRESSION: 1. Normal intracranial CTA. 2. No carotid dissection, aneurysm or stenosis. 3. 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 Electronically Signed   By: Deatra Robinson M.D.   On: 03/16/2017 23:59   Mr Brain Wo Contrast  Result Date: 03/17/2017 CLINICAL DATA:  Right-sided facial numbness. History of hypertension, headaches, and breast cancer. EXAM: MRI HEAD WITHOUT CONTRAST MRA HEAD WITHOUT CONTRAST TECHNIQUE: Multiplanar, multiecho pulse sequences of the brain and surrounding structures were obtained without intravenous contrast. Angiographic images of the head were obtained using MRA technique without contrast. COMPARISON:  Head CT and CTA 03/16/2017 FINDINGS: MRI HEAD FINDINGS Brain: There is no evidence of acute infarct, intracranial hemorrhage, mass, midline shift, or extra-axial fluid collection. The ventricles and sulci are normal. The  brain is normal in signal. Vascular: Normal flow voids. Skull and upper cervical spine: No  suspicious marrow lesion. Sinuses/Orbits: Unremarkable orbits. Paranasal sinuses and mastoid air cells are clear. Other: None. MRA HEAD FINDINGS The visualized distal vertebral arteries are patent with the left being strongly dominant. Left PICA and right AICA appear dominant. SCA origins are patent. Basilar artery is widely patent. There are right larger than left posterior communicating arteries. ACAs and MCAs are patent without evidence of proximal branch occlusion or significant stenosis. The left ACA is mildly dominant. No intracranial aneurysm is identified. IMPRESSION: Negative head MRI and MRA. Electronically Signed   By: Logan Bores M.D.   On: 03/17/2017 10:33   US Carotid Bilateral (at [facility] And [facility] Only)  Result Date: 03/17/2017 CLINICAL DATA:  TIA.  History of hypertension. EXAM: BILATERAL CAROTID DUPLEX ULTRASOUND TECHNIQUE: Pearline Cables scale imaging, color Doppler and duplex ultrasound were performed of bilateral carotid and vertebral arteries in the neck. COMPARISON:  None. FINDINGS: Criteria: Quantification of carotid stenosis is based on velocity parameters that correlate the residual internal carotid diameter with NASCET-based stenosis levels, using the diameter of the distal internal carotid lumen as the denominator for stenosis measurement. The following velocity measurements were obtained: RIGHT ICA:  80/23 cm/sec CCA:  174/08 cm/sec SYSTOLIC ICA/CCA RATIO:  0.8 DIASTOLIC ICA/CCA RATIO:  1.2 ECA:  100 cm/sec LEFT ICA:  92/28 cm/sec CCA:  14/48 cm/sec SYSTOLIC ICA/CCA RATIO:  1.0 DIASTOLIC ICA/CCA RATIO:  1.5 ECA:  65 cm/sec RIGHT CAROTID ARTERY: There is no grayscale evidence significant intimal thickening or atherosclerotic plaque affecting interrogated portions of the right carotid system. There are no elevated peak systolic velocities with the interrogated course of the right internal carotid artery to suggest a hemodynamically significant stenosis. RIGHT VERTEBRAL ARTERY:  Antegrade flow LEFT  CAROTID ARTERY: There is no grayscale evidence of significant intimal thickening or atherosclerotic plaque affecting interrogated portions of the left carotid system. There are no elevated peak systolic velocities within the interrogated course of the left internal carotid artery to suggest a hemodynamically significant stenosis. LEFT VERTEBRAL ARTERY:  Antegrade Flow IMPRESSION: Normal carotid Doppler ultrasound. Electronically Signed   By: Sandi Mariscal M.D.   On: 03/17/2017 10:22   Mr Jodene Nam Head/brain JE Cm  Result Date: 03/17/2017 CLINICAL DATA:  Right-sided facial numbness. History of hypertension, headaches, and breast cancer. EXAM: MRI HEAD WITHOUT CONTRAST MRA HEAD WITHOUT CONTRAST TECHNIQUE: Multiplanar, multiecho pulse sequences of the brain and surrounding structures were obtained without intravenous contrast. Angiographic images of the head were obtained using MRA technique without contrast. COMPARISON:  Head CT and CTA 03/16/2017 FINDINGS: MRI HEAD FINDINGS Brain: There is no evidence of acute infarct, intracranial hemorrhage, mass, midline shift, or extra-axial fluid collection. The ventricles and sulci are normal. The brain is normal in signal. Vascular: Normal flow voids. Skull and upper cervical spine: No suspicious marrow lesion. Sinuses/Orbits: Unremarkable orbits. Paranasal sinuses and mastoid air cells are clear. Other: None. MRA HEAD FINDINGS The visualized distal vertebral arteries are patent with the left being strongly dominant. Left PICA and right AICA appear dominant. SCA origins are patent. Basilar artery is widely patent. There are right larger than left posterior communicating arteries. ACAs and MCAs are patent without evidence of proximal branch occlusion or significant stenosis. The left ACA is mildly dominant. No intracranial aneurysm is identified. IMPRESSION: Negative head MRI and MRA. Electronically Signed   By: Logan Bores M.D.   On: 03/17/2017 10:33    Assessment: 46 y.o.  female presenting with right sided facial numbness and weakness.  Symptoms persist.  MRI of the brain reviewed and shows no acute changes.  CTA of the head and neck are unremarkable.  Patient on no antiplatelet therapy at home.  Unclear etiology of symptoms.  Can not rule out the possibility of an early Bell's palsy.  BP now controlled.  LDL 67.    Stroke Risk Factors - hypertension  Plan: 1. Prophylactic therapy-Antiplatelet med: Aspirin - dose '81mg'$  daily 2.Telemetry monitoring 3. Frequent neuro checks 4. ESR, CRP    Alexis Goodell, MD Neurology [phone] 03/17/2017, 10:52 AM

## 2017-03-17 NOTE — Discharge Summary (Signed)
[facility] NAME: [name] [name]    MR#:  [id number]  DATE OF BIRTH:  September 16, 1971  DATE OF ADMISSION:  03/16/2017 ADMITTING PHYSICIAN: Harvie Bridge, DO  DATE OF DISCHARGE:03/17/2017  PRIMARY CARE PHYSICIAN: Marda Stalker, PA-C    ADMISSION DIAGNOSIS:  TIA (transient ischemic attack) [G45.9] Right facial numbness [R20.0] Hypertension, unspecified type [I10]  DISCHARGE DIAGNOSIS:  Active Problems:   TIA (transient ischemic attack)   SECONDARY DIAGNOSIS:   Past Medical History:  Diagnosis Date  . Breast cancer, left breast ([place]) 2000   Underwent lumpectomy, chemotherapy and radiation  . Hypertension   . Renal disorder     HOSPITAL COURSE:  Pty came with left facial weakness and some limb weakness, MRI brain, CT A head and neck and carotid doppler done. Echo cardiogram done.  pt felt symptomatically better.  Seen by neurologist- suggest to add aspirin.  DISCHARGE CONDITIONS:   Stable.  CONSULTS OBTAINED:  Treatment Team:  Alexis Goodell, MD  DRUG ALLERGIES:   Allergies  Allergen Reactions  . Ace Inhibitors Swelling    DISCHARGE MEDICATIONS:   Current Discharge Medication List    START taking these medications   Details  aspirin EC 81 MG EC tablet Take 1 tablet (81 mg total) by mouth daily. Qty: 30 tablet, Refills: 0      CONTINUE these medications which have NOT CHANGED   Details  cloNIDine (CATAPRES) 0.2 MG tablet Take 0.2 mg by mouth 3 (three) times daily as needed (high blood pressure).     ferrous sulfate 325 (65 FE) MG tablet Take 1 tablet (325 mg total) by mouth daily. Qty: 30 tablet, Refills: 0    ibuprofen (ADVIL,MOTRIN) 200 MG tablet Take 400 mg by mouth every 6 (six) hours as needed. For pain.     Multiple Vitamin (MULITIVITAMIN WITH MINERALS) TABS Take 1 tablet by mouth daily.      nebivolol (BYSTOLIC) 10 MG tablet Take 1 tablet (10 mg total) by mouth daily. Qty: 30  tablet, Refills: 11    spironolactone (ALDACTONE) 25 MG tablet Take 25 mg by mouth daily.         DISCHARGE INSTRUCTIONS:    Follow with PMD in 1 week.  If you experience worsening of your admission symptoms, develop shortness of breath, life threatening emergency, suicidal or homicidal thoughts you must seek medical attention immediately by calling 911 or calling your MD immediately  if symptoms less severe.  You Must read complete instructions/literature along with all the possible adverse reactions/side effects for all the Medicines you take and that have been prescribed to you. Take any new Medicines after you have completely understood and accept all the possible adverse reactions/side effects.   Please note  You were cared for by a hospitalist during your hospital stay. If you have any questions about your discharge medications or the care you received while you were in the hospital after you are discharged, you can call the unit and asked to speak with the hospitalist on call if the hospitalist that took care of you is not available. Once you are discharged, your primary care physician will handle any further medical issues. Please note that NO REFILLS for any discharge medications will be authorized once you are discharged, as it is imperative that you return to your primary care physician (or establish a relationship with a primary care physician if you do not have one) for your aftercare needs so that they can reassess  your need for medications and monitor your lab values.    Today   CHIEF COMPLAINT:   Chief Complaint  Patient presents with  . Numbness  . Code Stroke    HISTORY OF PRESENT ILLNESS:  [name] [name]  is a 46 y.o. female with a known history of breast cancer, HTN, CKD was in a usual state of health until 45 minutes prior to arrival when she complained of right sided facial numbness which persist at present but were not associated with any other symptoms.  She  has never had symptoms like this in the past, no history of migraines, increased stress, neck pain or trauma.  No vision changes.  Patient denies fevers/chills, weakness, dizziness, chest pain, shortness of breath, N/V/C/D, abdominal pain, dysuria/frequency, changes in mental status.   Otherwise there has been no change in status. Patient has been taking medication as prescribed and there has been no recent change in medication or diet.  There has been no recent illness, travel or sick contacts.     VITAL SIGNS:  Blood pressure 134/72, pulse (!) 56, temperature 98.2 F (36.8 C), temperature source Oral, resp. rate 16, height 4\' 11"  (1.499 m), weight 90.9 kg (200 lb 8 oz), last menstrual period 12/16/2016, SpO2 98 %.  I/O:   Intake/Output Summary (Last 24 hours) at 03/17/17 1416 Last data filed at 03/17/17 1210  Gross per 24 hour  Intake          1203.75 ml  Output              850 ml  Net           353.75 ml    PHYSICAL EXAMINATION:  GENERAL:  46 y.o.-year-old patient lying in the bed with no acute distress.  EYES: Pupils equal, round, reactive to light and accommodation. No scleral icterus. Extraocular muscles intact.  HEENT: Head atraumatic, normocephalic. Oropharynx and nasopharynx clear.  NECK:  Supple, no jugular venous distention. No thyroid enlargement, no tenderness.  LUNGS: Normal breath sounds bilaterally, no wheezing, rales,rhonchi or crepitation. No use of accessory muscles of respiration.  CARDIOVASCULAR: S1, S2 normal. No murmurs, rubs, or gallops.  ABDOMEN: Soft, non-tender, non-distended. Bowel sounds present. No organomegaly or mass.  EXTREMITIES: No pedal edema, cyanosis, or clubbing.  NEUROLOGIC: Cranial nerves II through XII are intact. Muscle strength 5/5 in all extremities. Sensation intact. Gait not checked.  PSYCHIATRIC: The patient is alert and oriented x 3.  SKIN: No obvious rash, lesion, or ulcer.   DATA REVIEW:   CBC  Recent Labs Lab 03/16/17 2207   WBC 6.2  HGB 12.3  HCT 37.9  PLT 194    Chemistries   Recent Labs Lab 03/16/17 2207  NA 137  K 3.5  CL 104  CO2 25  GLUCOSE 136*  BUN 32*  CREATININE 1.33*  CALCIUM 10.1    Cardiac Enzymes  Recent Labs Lab 03/17/17 0903  TROPONINI <0.03    Microbiology Results  No results found for this or any previous visit.  RADIOLOGY:  Ct Angio Head W Or Wo Contrast  Result Date: 03/16/2017 CLINICAL DATA:  Right-sided facial numbness EXAM: CT ANGIOGRAPHY HEAD AND NECK TECHNIQUE: Multidetector CT imaging of the head and neck was performed using the standard protocol during bolus administration of intravenous contrast. Multiplanar CT image reconstructions and MIPs were obtained to evaluate the vascular anatomy. Carotid stenosis measurements (when applicable) are obtained utilizing NASCET criteria, using the distal internal carotid diameter as the denominator. CONTRAST:  75 mL  Isovue 370 COMPARISON:  Head CT 03/16/2017 FINDINGS: CTA NECK FINDINGS Aortic arch: There is no aneurysm or dissection of the visualized ascending aorta or aortic arch. There is a normal 3 vessel branching pattern. The visualized proximal subclavian arteries are normal. Right carotid system: The right common carotid origin is widely patent. There is no common carotid or internal carotid artery dissection or aneurysm. No hemodynamically significant stenosis. Left carotid system: The left common carotid origin is widely patent. There is no common carotid or internal carotid artery dissection or aneurysm. No hemodynamically significant stenosis. Vertebral arteries: The vertebral system is left dominant. Both vertebral artery origins are normal. The right vertebral artery is diminutive along its entire course. Left vertebral artery is normal to the vertebrobasilar confluence. Skeleton: There is no bony spinal canal stenosis. No lytic or blastic lesions. Other neck: The nasopharynx is clear. The oropharynx and hypopharynx are  normal. The epiglottis is normal. The supraglottic larynx, glottis and subglottic larynx are normal. No retropharyngeal collection. The parapharyngeal spaces are preserved. The parotid and submandibular glands are normal. No sialolithiasis or salivary ductal dilatation. The thyroid gland is normal. There is no cervical lymphadenopathy. Upper chest: No pneumothorax or pleural effusion. No nodules or masses. Review of the MIP images confirms the above findings CTA HEAD FINDINGS Anterior circulation: --Intracranial internal carotid arteries: Normal. --Anterior cerebral arteries: Normal. --Middle cerebral arteries: Normal. --Posterior communicating arteries: Present bilaterally. Posterior circulation: --Posterior cerebral arteries: There is a fetal origin of the right posterior cerebral artery. Normal left posterior cerebral artery. --Superior cerebellar arteries: Normal. --Basilar artery: Normal. --Anterior inferior cerebellar arteries: Not clearly seen on the left. Normal on the right. --Posterior inferior cerebellar arteries: Left dominant. Venous sinuses: As permitted by contrast timing, patent. Anatomic variants: Fetal origin of the right posterior cerebral artery, a normal variant. Delayed phase: No parenchymal contrast enhancement. Review of the MIP images confirms the above findings IMPRESSION: 1. Normal intracranial CTA. 2. No carotid dissection, aneurysm or stenosis. 3. 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 Electronically Signed   By: Ulyses Jarred M.D.   On: 03/16/2017 23:59   Ct Head Wo Contrast  Addendum Date: 03/16/2017   ADDENDUM REPORT: 03/16/2017 21:56 ADDENDUM: Study discussed by telephone with Dr. Merlyn Lot on 03/16/2017 at 0953 hours. Electronically Signed   By: Genevie Ann M.D.   On: 03/16/2017 21:56   Result Date: 03/16/2017 CLINICAL DATA:  46 year old female right side facial weakness for 45 minutes. Initial encounter. EXAM: CT HEAD WITHOUT CONTRAST  TECHNIQUE: Contiguous axial images were obtained from the base of the skull through the vertex without intravenous contrast. COMPARISON:  None. FINDINGS: Brain: Cerebral volume is normal. No midline shift, ventriculomegaly, mass effect, evidence of mass lesion, intracranial hemorrhage or evidence of cortically based acute infarction. Gray-white matter differentiation is within normal limits throughout the brain. Vascular: No suspicious intracranial vascular hyperdensity. Skull: Normal CT appearance of the stylomastoid foramina. No osseous abnormality identified. Sinuses/Orbits: Clear. Other: Negative visible orbit and scalp soft tissues. IMPRESSION: Normal noncontrast CT appearance of the brain. Electronically Signed: By: Genevie Ann M.D. On: 03/16/2017 21:51   Ct Angio Neck W And/or Wo Contrast  Result Date: 03/16/2017 CLINICAL DATA:  Right-sided facial numbness EXAM: CT ANGIOGRAPHY HEAD AND NECK TECHNIQUE: Multidetector CT imaging of the head and neck was performed using the standard protocol during bolus administration of intravenous contrast. Multiplanar CT image reconstructions and MIPs were obtained to evaluate the vascular anatomy. Carotid stenosis measurements (when applicable) are obtained  utilizing NASCET criteria, using the distal internal carotid diameter as the denominator. CONTRAST:  75 mL Isovue 370 COMPARISON:  Head CT 03/16/2017 FINDINGS: CTA NECK FINDINGS Aortic arch: There is no aneurysm or dissection of the visualized ascending aorta or aortic arch. There is a normal 3 vessel branching pattern. The visualized proximal subclavian arteries are normal. Right carotid system: The right common carotid origin is widely patent. There is no common carotid or internal carotid artery dissection or aneurysm. No hemodynamically significant stenosis. Left carotid system: The left common carotid origin is widely patent. There is no common carotid or internal carotid artery dissection or aneurysm. No  hemodynamically significant stenosis. Vertebral arteries: The vertebral system is left dominant. Both vertebral artery origins are normal. The right vertebral artery is diminutive along its entire course. Left vertebral artery is normal to the vertebrobasilar confluence. Skeleton: There is no bony spinal canal stenosis. No lytic or blastic lesions. Other neck: The nasopharynx is clear. The oropharynx and hypopharynx are normal. The epiglottis is normal. The supraglottic larynx, glottis and subglottic larynx are normal. No retropharyngeal collection. The parapharyngeal spaces are preserved. The parotid and submandibular glands are normal. No sialolithiasis or salivary ductal dilatation. The thyroid gland is normal. There is no cervical lymphadenopathy. Upper chest: No pneumothorax or pleural effusion. No nodules or masses. Review of the MIP images confirms the above findings CTA HEAD FINDINGS Anterior circulation: --Intracranial internal carotid arteries: Normal. --Anterior cerebral arteries: Normal. --Middle cerebral arteries: Normal. --Posterior communicating arteries: Present bilaterally. Posterior circulation: --Posterior cerebral arteries: There is a fetal origin of the right posterior cerebral artery. Normal left posterior cerebral artery. --Superior cerebellar arteries: Normal. --Basilar artery: Normal. --Anterior inferior cerebellar arteries: Not clearly seen on the left. Normal on the right. --Posterior inferior cerebellar arteries: Left dominant. Venous sinuses: As permitted by contrast timing, patent. Anatomic variants: Fetal origin of the right posterior cerebral artery, a normal variant. Delayed phase: No parenchymal contrast enhancement. Review of the MIP images confirms the above findings IMPRESSION: 1. Normal intracranial CTA. 2. No carotid dissection, aneurysm or stenosis. 3. 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 Electronically Signed   By: Ulyses Jarred M.D.    On: 03/16/2017 23:59   Mr Brain Wo Contrast  Result Date: 03/17/2017 CLINICAL DATA:  Right-sided facial numbness. History of hypertension, headaches, and breast cancer. EXAM: MRI HEAD WITHOUT CONTRAST MRA HEAD WITHOUT CONTRAST TECHNIQUE: Multiplanar, multiecho pulse sequences of the brain and surrounding structures were obtained without intravenous contrast. Angiographic images of the head were obtained using MRA technique without contrast. COMPARISON:  Head CT and CTA 03/16/2017 FINDINGS: MRI HEAD FINDINGS Brain: There is no evidence of acute infarct, intracranial hemorrhage, mass, midline shift, or extra-axial fluid collection. The ventricles and sulci are normal. The brain is normal in signal. Vascular: Normal flow voids. Skull and upper cervical spine: No suspicious marrow lesion. Sinuses/Orbits: Unremarkable orbits. Paranasal sinuses and mastoid air cells are clear. Other: None. MRA HEAD FINDINGS The visualized distal vertebral arteries are patent with the left being strongly dominant. Left PICA and right AICA appear dominant. SCA origins are patent. Basilar artery is widely patent. There are right larger than left posterior communicating arteries. ACAs and MCAs are patent without evidence of proximal branch occlusion or significant stenosis. The left ACA is mildly dominant. No intracranial aneurysm is identified. IMPRESSION: Negative head MRI and MRA. Electronically Signed   By: Logan Bores M.D.   On: 03/17/2017 10:33   US Carotid Bilateral (at [facility] Only)  Result Date: 03/17/2017 CLINICAL DATA:  TIA.  History of hypertension. EXAM: BILATERAL CAROTID DUPLEX ULTRASOUND TECHNIQUE: Pearline Cables scale imaging, color Doppler and duplex ultrasound were performed of bilateral carotid and vertebral arteries in the neck. COMPARISON:  None. FINDINGS: Criteria: Quantification of carotid stenosis is based on velocity parameters that correlate the residual internal carotid diameter with NASCET-based stenosis levels,  using the diameter of the distal internal carotid lumen as the denominator for stenosis measurement. The following velocity measurements were obtained: RIGHT ICA:  80/23 cm/sec CCA:  979/89 cm/sec SYSTOLIC ICA/CCA RATIO:  0.8 DIASTOLIC ICA/CCA RATIO:  1.2 ECA:  100 cm/sec LEFT ICA:  92/28 cm/sec CCA:  21/19 cm/sec SYSTOLIC ICA/CCA RATIO:  1.0 DIASTOLIC ICA/CCA RATIO:  1.5 ECA:  65 cm/sec RIGHT CAROTID ARTERY: There is no grayscale evidence significant intimal thickening or atherosclerotic plaque affecting interrogated portions of the right carotid system. There are no elevated peak systolic velocities with the interrogated course of the right internal carotid artery to suggest a hemodynamically significant stenosis. RIGHT VERTEBRAL ARTERY:  Antegrade flow LEFT CAROTID ARTERY: There is no grayscale evidence of significant intimal thickening or atherosclerotic plaque affecting interrogated portions of the left carotid system. There are no elevated peak systolic velocities within the interrogated course of the left internal carotid artery to suggest a hemodynamically significant stenosis. LEFT VERTEBRAL ARTERY:  Antegrade Flow IMPRESSION: Normal carotid Doppler ultrasound. Electronically Signed   By: Sandi Mariscal M.D.   On: 03/17/2017 10:22   Mr Jodene Nam Head/brain ER Cm  Result Date: 03/17/2017 CLINICAL DATA:  Right-sided facial numbness. History of hypertension, headaches, and breast cancer. EXAM: MRI HEAD WITHOUT CONTRAST MRA HEAD WITHOUT CONTRAST TECHNIQUE: Multiplanar, multiecho pulse sequences of the brain and surrounding structures were obtained without intravenous contrast. Angiographic images of the head were obtained using MRA technique without contrast. COMPARISON:  Head CT and CTA 03/16/2017 FINDINGS: MRI HEAD FINDINGS Brain: There is no evidence of acute infarct, intracranial hemorrhage, mass, midline shift, or extra-axial fluid collection. The ventricles and sulci are normal. The brain is normal in signal.  Vascular: Normal flow voids. Skull and upper cervical spine: No suspicious marrow lesion. Sinuses/Orbits: Unremarkable orbits. Paranasal sinuses and mastoid air cells are clear. Other: None. MRA HEAD FINDINGS The visualized distal vertebral arteries are patent with the left being strongly dominant. Left PICA and right AICA appear dominant. SCA origins are patent. Basilar artery is widely patent. There are right larger than left posterior communicating arteries. ACAs and MCAs are patent without evidence of proximal branch occlusion or significant stenosis. The left ACA is mildly dominant. No intracranial aneurysm is identified. IMPRESSION: Negative head MRI and MRA. Electronically Signed   By: Logan Bores M.D.   On: 03/17/2017 10:33    EKG:   Orders placed or performed during the hospital encounter of 03/16/17  . EKG 12-Lead  . EKG 12-Lead      Management plans discussed with the patient, family and they are in agreement.  CODE STATUS:     Code Status Orders        Start     Ordered   03/17/17 0317  Full code  Continuous     03/17/17 0316    Code Status History    Date Active Date Inactive Code Status Order ID Comments User Context   This patient has a current code status but no historical code status.      TOTAL TIME TAKING CARE OF THIS PATIENT: 35 minutes.    Bubber Rothert, Rosalio Macadamia  M.D on 03/17/2017 at 2:16 PM  Between 7am to 6pm - Pager - [phone]  After 6pm go to [email or web address] - password [email or web address] [facility]  Office  [phone]  CC: Primary care physician; Marda Stalker, PA-C   Note: This dictation was prepared with Dragon dictation along with smaller phrase technology. Any transcriptional errors that result from this process are unintentional.

## 2017-03-17 NOTE — Progress Notes (Addendum)
OT Cancellation Note  Patient Details Name: [name] [name] MRN: [id number] DOB: 07-01-1971   Cancelled Treatment:    Reason Eval/Treat Not Completed: Patient at procedure or test/ unavailable. Order received, chart reviewed. Pt out of room for testing. Spoke with RN re: bed rest order. RN cleared rehab to see pt once she is back in room. Will re-attempt OT evaluation at later time/date as pt is available.   Jeni Salles, MPH, MS, OTR/L ascom [phone] 03/17/17, 9:41 AM

## 2017-03-17 NOTE — Care Management (Signed)
Presents with sx concerning for CVA/TIA. Work up so far negative.  No discharge needs identified by members of the care team at the present time.

## 2017-03-17 NOTE — Progress Notes (Signed)
SLP Cancellation Note  Patient Details Name: [place] Ehrlich MRN: [id number] DOB: [birth year]   Cancelled treatment:       Reason Eval/Treat Not Completed: SLP screened, no needs identified, will sign off (Consulted NSG, reviewed chart. )  Pt denied any trouble with speech/language stating she was back at her baseline, "I feel fine". Pt communicating with NSG and SLP at conversational level with no concerns. Consulted NSG who denied any concerns with pt's speech or language. Pt eating breakfast upon entrance with no concerns with swallowing. No further skilled ST services indicated, NSG to re-consult if any change in status.   Eulogio Ditch, B.S Graduate Clinician  03/17/2017, 9:00 AM

## 2017-03-17 NOTE — Evaluation (Signed)
Physical Therapy Evaluation Patient Details Name: [name] [name] MRN: [id number] DOB: Oct 13, 1971 Today's Date: 03/17/2017   History of Present Illness  Pt is a 46 y.o. female with a known history of breast cancer, HTN, CKD presenting with right sided facial numbness and weakness in ED on 4/15. Symptoms persist.  MRI of the brain reviewed and shows no acute changes.  CTA of the head and neck are unremarkable. Per neurology, unclear etiology of symptoms, can not rule out the possibility of an early Bell's palsy.     Clinical Impression  Pt presents with no deficits in strength, transfers, mobility, gait, balance, or activity tolerance.  Pt Ind with all bed mobility and transfer tasks and was able to amb 200+ feet safely and independently without AD with good cadence.  Pt able to ascend/descend 12 steps x 1 independently once with one rail and once without rails.  Sensation to light touch and proprioception grossly intact to BLEs.  No deficits noted to visual field or tracking, no deficits noted to coordination/RAMs in either UE or LE.  Pt reports feeling at baseline physically.  Will complete PT orders at this time and will reassess pt pending a change in status upon receipt of new PT orders.       Follow Up Recommendations No PT follow up    Equipment Recommendations  None recommended by PT    Recommendations for Other Services       Precautions / Restrictions Precautions Precautions: None Restrictions Weight Bearing Restrictions: No      Mobility  Bed Mobility Overal bed mobility: Independent             General bed mobility comments: did not formally assess, pt up in recliner at start of session, pt reports indep with bed mobility, RN reports the same  Transfers Overall transfer level: Independent Equipment used: None             General transfer comment: Ind with sit to stand transfers from recliner and EOB; good balance, safety awareness, no  LOB  Ambulation/Gait Ambulation/Gait assistance: Independent Ambulation Distance (Feet): 200 Feet Assistive device: None Gait Pattern/deviations: WFL(Within Functional Limits)   Gait velocity interpretation: at or above normal speed for age/gender General Gait Details: Ind Amb 200+ feet without AD with good cadence and no instability noted  Stairs Stairs: Yes Stairs assistance: Independent Stair Management: One rail Left Number of Stairs: 12 General stair comments: Pt able to ascend/descend 12 steps with one rail and then without rails safely with good stability  Wheelchair Mobility    Modified Rankin (Stroke Patients Only)       Balance Overall balance assessment: Independent                                           Pertinent Vitals/Pain Pain Assessment: No/denies pain    Home Living Family/patient expects to be discharged to:: Private residence Living Arrangements: Spouse/significant other;Children Available Help at Discharge: Family;Available PRN/intermittently Type of Home: House Home Access: Stairs to enter Entrance Stairs-Rails: None Entrance Stairs-Number of Steps: 1 Home Layout: Multi-level Home Equipment: None      Prior Function Level of Independence: Independent         Comments: pt reports indep with all ADL, IADL including driving, working (desk/computer work), caring for children, household mgt tasks; no falls reported in past 12 months     Hand Dominance  Extremity/Trunk Assessment   Upper Extremity Assessment Upper Extremity Assessment: Overall WFL for tasks assessed    Lower Extremity Assessment Lower Extremity Assessment: Overall WFL for tasks assessed    Cervical / Trunk Assessment Cervical / Trunk Assessment: Normal  Communication   Communication: No difficulties  Cognition Arousal/Alertness: Awake/alert Behavior During Therapy: WFL for tasks assessed/performed Overall Cognitive Status: Within  Functional Limits for tasks assessed                                        General Comments      Exercises     Assessment/Plan    PT Assessment Patent does not need any further PT services  PT Problem List         PT Treatment Interventions      PT Goals (Current goals can be found in the Care Plan section)  Acute Rehab PT Goals Patient Stated Goal: To get back home PT Goal Formulation: With patient Potential to Achieve Goals: Good    Frequency     Barriers to discharge        Co-evaluation               End of Session Equipment Utilized During Treatment: Gait belt Activity Tolerance: Patient tolerated treatment well Patient left: in chair;with call bell/phone within reach   PT Visit Diagnosis: Muscle weakness (generalized) (M62.81)    Time: 2595-6387 PT Time Calculation (min) (ACUTE ONLY): 16 min   Charges:   PT Evaluation $PT Eval Low Complexity: 1 Procedure     PT G Codes:   PT G-Codes **NOT FOR INPATIENT CLASS** Functional Assessment Tool Used: AM-PAC 6 Clicks Basic Mobility Functional Limitation: Mobility: Walking and moving around Mobility: Walking and Moving Around Current Status (F6433): 0 percent impaired, limited or restricted Mobility: Walking and Moving Around Goal Status (I9518): 0 percent impaired, limited or restricted Mobility: Walking and Moving Around Discharge Status (A4166): 0 percent impaired, limited or restricted    D. Royetta Asal PT, DPT 03/17/17, 2:56 PM

## 2017-03-17 NOTE — H&P (Addendum)
[place] @ [facility] Admission History and Physical [employer], D.O.  ---------------------------------------------------------------------------------------------------------------------   PATIENT NAME: [name] [name] MR#: [id number] DATE OF BIRTH: September 14, 1971 DATE OF ADMISSION: 03/16/2017 PRIMARY CARE PHYSICIAN: Marda Stalker, PA-C  REQUESTING/REFERRING PHYSICIAN: ED Dr. Quentin Cornwall  CHIEF COMPLAINT: Chief Complaint  Patient presents with  . Numbness  . Code Stroke    HISTORY OF PRESENT ILLNESS: [name] [name] is a 46 y.o. female with a known history of breast cancer, HTN, CKD was in a usual state of health until 45 minutes prior to arrival when she complained of right sided facial numbness which persist at present but were not associated with any other symptoms.  She has never had symptoms like this in the past, no history of migraines, increased stress, neck pain or trauma.  No vision changes.  Patient denies fevers/chills, weakness, dizziness, chest pain, shortness of breath, N/V/C/D, abdominal pain, dysuria/frequency, changes in mental status.   46 y.o. female with a known history of breast cancer, HTN, CKD was in a usual state of health until 45 minutes prior to arrival when she complained of right sided facial numbness which persist at present but were not associated with any other symptoms.  She has never had symptoms like this in the past, no history of migraines, increased stress, neck pain or trauma.  No vision changes.  Patient denies fevers/chills, weakness, dizziness, chest pain, shortness of breath, N/V/C/D, abdominal pain, dysuria/frequency, changes in mental status.   Otherwise there has been no change in status. Patient has been taking medication as prescribed and there has been no recent change in medication or diet.  There has been no recent illness, travel or sick contacts.    EMS/ED COURSE:  Patient received aspirin 325, benadryl 25, compazine, NS   PAST MEDICAL HISTORY: Past Medical History:  Diagnosis Date  . Breast cancer, left breast ([place]) 2000   Underwent lumpectomy, chemotherapy and radiation  . Hypertension   . Renal disorder       PAST SURGICAL HISTORY: Past Surgical History:  Procedure Laterality Date  . BREAST LUMPECTOMY WITH AXILLARY LYMPH NODE BIOPSY Left 2000   biopsy      SOCIAL HISTORY: Social History  Substance Use Topics  . Smoking status: Never Smoker  . Smokeless tobacco: Never Used  . Alcohol use No      FAMILY HISTORY: Grandfather  with heart disease  MEDICATIONS AT HOME: Prior to Admission medications   Medication Sig Start Date End Date Taking? Authorizing Provider  cloNIDine (CATAPRES) 0.2 MG tablet Take 0.2 mg by mouth 3 (three) times daily as needed (high blood pressure).    Yes Historical Provider, MD  ferrous sulfate 325 (65 FE) MG tablet Take 1 tablet (325 mg total) by mouth daily. 11/06/16 11/06/17 Yes Daymon Larsen, MD  ibuprofen (ADVIL,MOTRIN) 200 MG tablet Take 400 mg by mouth every 6 (six) hours as needed. For pain.    Yes Historical Provider, MD  Multiple Vitamin (MULITIVITAMIN WITH MINERALS) TABS Take 1 tablet by mouth daily.     Yes Historical Provider, MD  nebivolol (BYSTOLIC) 10 MG tablet Take 1 tablet (10 mg total) by mouth daily. 01/28/17  Yes Jerline Pain, MD  spironolactone (ALDACTONE) 25 MG tablet Take 25 mg by mouth daily.   Yes Historical Provider, MD      DRUG ALLERGIES: Allergies  Allergen Reactions  . Ace Inhibitors Swelling     REVIEW OF SYSTEMS: CONSTITUTIONAL: No fever/chills, fatigue, weakness, weight gain/loss, headache EYES: No blurry or double vision. ENT: No tinnitus, postnasal drip, redness or soreness of the oropharynx. RESPIRATORY: No cough, wheeze, hemoptysis, dyspnea. CARDIOVASCULAR: No chest pain, orthopnea, palpitations, syncope. GASTROINTESTINAL: No nausea, vomiting, constipation, diarrhea, abdominal pain, hematemesis, melena or hematochezia. GENITOURINARY: No dysuria or hematuria. ENDOCRINE: No polyuria or nocturia. No heat or cold intolerance. HEMATOLOGY: No anemia, bruising, bleeding. INTEGUMENTARY: No rashes, ulcers, lesions. MUSCULOSKELETAL: No arthritis, swelling, gout. NEUROLOGIC: Positive right facial numbness, tingling, negative weakness or ataxia. No seizure-type activity. PSYCHIATRIC: No anxiety, depression, insomnia.  PHYSICAL EXAMINATION:  VITAL SIGNS: Blood pressure 135/81, pulse 71, temperature 98.5 F (36.9 C), temperature source Oral, resp. rate  (!) 26, height 4\' 11"  (1.499 m), weight 86.2 kg (190 lb), last menstrual period 12/16/2016, SpO2 97 %.  GENERAL: 46 y.o.-year-old female patient, well-developed, well-nourished lying in the bed in no acute distress.  Pleasant and cooperative.   HEENT: Head atraumatic, normocephalic. Pupils equal, round, reactive to light and accommodation. No scleral icterus. Extraocular muscles intact. Nares are patent. Oropharynx is clear. Mucus membranes moist. NECK: Supple, full range of motion. No JVD, no bruit heard. No thyroid enlargement, no tenderness, no cervical lymphadenopathy. CHEST: Normal breath sounds bilaterally. No wheezing, rales, rhonchi or crackles. No use of accessory muscles of respiration.  No reproducible chest wall tenderness.  CARDIOVASCULAR: S1, S2 normal. No murmurs, rubs, or gallops. Cap refill <2 seconds. ABDOMEN: Soft, nontender, nondistended. No rebound, guarding, rigidity. Normoactive bowel sounds present in all four quadrants. No organomegaly or mass. EXTREMITIES: Full range of motion. No pedal edema, cyanosis, or clubbing. NEUROLOGIC: Cranial nerves II through XII are grossly intact with no focal sensorimotor deficit. Muscle strength 5/5 in all extremities. Sensation diminished over right midface and lower face.  Gait not checked. PSYCHIATRIC: The patient is alert and oriented x 3. Normal affect, mood, thought content. SKIN: Warm, dry, and intact without obvious rash, lesion, or ulcer.  LABORATORY PANEL:  CBC  Recent Labs Lab 03/16/17 2207  WBC 6.2  HGB 12.3  HCT 37.9  PLT 194   ----------------------------------------------------------------------------------------------------------------- Chemistries  Recent Labs Lab 03/16/17 2207  NA 137  K 3.5  CL 104  CO2 25  GLUCOSE 136*  BUN 32*  CREATININE 1.33*  CALCIUM 10.1   ------------------------------------------------------------------------------------------------------------------ Cardiac Enzymes  Recent  Labs Lab 03/16/17 2207  TROPONINI <0.03   ------------------------------------------------------------------------------------------------------------------  RADIOLOGY: Ct Angio Head W Or Wo Contrast  Result Date: 03/16/2017 CLINICAL DATA:  Right-sided facial numbness EXAM: CT ANGIOGRAPHY HEAD AND NECK TECHNIQUE: Multidetector CT imaging of the head and neck was performed using the standard protocol during bolus administration of intravenous contrast. Multiplanar CT image reconstructions and MIPs were obtained to evaluate the vascular anatomy. Carotid stenosis measurements (when applicable) are obtained utilizing NASCET criteria, using the distal internal carotid diameter as the denominator. CONTRAST:  75 mL Isovue 370 COMPARISON:  Head CT 03/16/2017 FINDINGS: CTA NECK FINDINGS Aortic arch: There is no aneurysm or dissection of the visualized ascending aorta or aortic arch. There is a normal 3 vessel branching pattern. The visualized proximal subclavian arteries are normal. Right carotid system: The right common carotid origin is widely patent. There is no common carotid or internal carotid artery dissection or aneurysm. No hemodynamically significant stenosis. Left carotid system: The left common carotid origin is widely patent. There is no common carotid or internal carotid artery dissection or aneurysm. No hemodynamically significant stenosis. Vertebral arteries: The vertebral system is left dominant. Both vertebral artery origins are normal. The right vertebral artery is diminutive along its entire course. Left vertebral artery is normal to the vertebrobasilar confluence. Skeleton: There is no bony spinal canal stenosis. No lytic or blastic lesions. Other neck: The nasopharynx is clear. The oropharynx and hypopharynx are normal. The epiglottis is normal. The supraglottic larynx, glottis and subglottic larynx are normal. No retropharyngeal collection. The parapharyngeal spaces are preserved. The parotid  and submandibular glands are normal. No sialolithiasis or salivary ductal dilatation. The thyroid gland is normal. There is no cervical lymphadenopathy. Upper chest: No pneumothorax or pleural effusion. No nodules or masses. Review of  the MIP images confirms the above findings CTA HEAD FINDINGS Anterior circulation: --Intracranial internal carotid arteries: Normal. --Anterior cerebral arteries: Normal. --Middle cerebral arteries: Normal. --Posterior communicating arteries: Present bilaterally. Posterior circulation: --Posterior cerebral arteries: There is a fetal origin of the right posterior cerebral artery. Normal left posterior cerebral artery. --Superior cerebellar arteries: Normal. --Basilar artery: Normal. --Anterior inferior cerebellar arteries: Not clearly seen on the left. Normal on the right. --Posterior inferior cerebellar arteries: Left dominant. Venous sinuses: As permitted by contrast timing, patent. Anatomic variants: Fetal origin of the right posterior cerebral artery, a normal variant. Delayed phase: No parenchymal contrast enhancement. Review of the MIP images confirms the above findings IMPRESSION: 1. Normal intracranial CTA. 2. No carotid dissection, aneurysm or stenosis. 3. 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 Electronically Signed   By: Ulyses Jarred M.D.   On: 03/16/2017 23:59   Ct Head Wo Contrast  Addendum Date: 03/16/2017   ADDENDUM REPORT: 03/16/2017 21:56 ADDENDUM: Study discussed by telephone with Dr. Merlyn Lot on 03/16/2017 at 0953 hours. Electronically Signed   By: Genevie Ann M.D.   On: 03/16/2017 21:56   Result Date: 03/16/2017 CLINICAL DATA:  46 year old female right side facial weakness for 45 minutes. Initial encounter. EXAM: CT HEAD WITHOUT CONTRAST TECHNIQUE: Contiguous axial images were obtained from the base of the skull through the vertex without intravenous contrast. COMPARISON:  None. FINDINGS: Brain: Cerebral volume is  normal. No midline shift, ventriculomegaly, mass effect, evidence of mass lesion, intracranial hemorrhage or evidence of cortically based acute infarction. Gray-white matter differentiation is within normal limits throughout the brain. Vascular: No suspicious intracranial vascular hyperdensity. Skull: Normal CT appearance of the stylomastoid foramina. No osseous abnormality identified. Sinuses/Orbits: Clear. Other: Negative visible orbit and scalp soft tissues. IMPRESSION: Normal noncontrast CT appearance of the brain. Electronically Signed: By: Genevie Ann M.D. On: 03/16/2017 21:51   Ct Angio Neck W And/or Wo Contrast  Result Date: 03/16/2017 CLINICAL DATA:  Right-sided facial numbness EXAM: CT ANGIOGRAPHY HEAD AND NECK TECHNIQUE: Multidetector CT imaging of the head and neck was performed using the standard protocol during bolus administration of intravenous contrast. Multiplanar CT image reconstructions and MIPs were obtained to evaluate the vascular anatomy. Carotid stenosis measurements (when applicable) are obtained utilizing NASCET criteria, using the distal internal carotid diameter as the denominator. CONTRAST:  75 mL Isovue 370 COMPARISON:  Head CT 03/16/2017 FINDINGS: CTA NECK FINDINGS Aortic arch: There is no aneurysm or dissection of the visualized ascending aorta or aortic arch. There is a normal 3 vessel branching pattern. The visualized proximal subclavian arteries are normal. Right carotid system: The right common carotid origin is widely patent. There is no common carotid or internal carotid artery dissection or aneurysm. No hemodynamically significant stenosis. Left carotid system: The left common carotid origin is widely patent. There is no common carotid or internal carotid artery dissection or aneurysm. No hemodynamically significant stenosis. Vertebral arteries: The vertebral system is left dominant. Both vertebral artery origins are normal. The right vertebral artery is diminutive along its  entire course. Left vertebral artery is normal to the vertebrobasilar confluence. Skeleton: There is no bony spinal canal stenosis. No lytic or blastic lesions. Other neck: The nasopharynx is clear. The oropharynx and hypopharynx are normal. The epiglottis is normal. The supraglottic larynx, glottis and subglottic larynx are normal. No retropharyngeal collection. The parapharyngeal spaces are preserved. The parotid and submandibular glands are normal. No sialolithiasis or salivary ductal dilatation. The thyroid gland is normal. There is  no cervical lymphadenopathy. Upper chest: No pneumothorax or pleural effusion. No nodules or masses. Review of the MIP images confirms the above findings CTA HEAD FINDINGS Anterior circulation: --Intracranial internal carotid arteries: Normal. --Anterior cerebral arteries: Normal. --Middle cerebral arteries: Normal. --Posterior communicating arteries: Present bilaterally. Posterior circulation: --Posterior cerebral arteries: There is a fetal origin of the right posterior cerebral artery. Normal left posterior cerebral artery. --Superior cerebellar arteries: Normal. --Basilar artery: Normal. --Anterior inferior cerebellar arteries: Not clearly seen on the left. Normal on the right. --Posterior inferior cerebellar arteries: Left dominant. Venous sinuses: As permitted by contrast timing, patent. Anatomic variants: Fetal origin of the right posterior cerebral artery, a normal variant. Delayed phase: No parenchymal contrast enhancement. Review of the MIP images confirms the above findings IMPRESSION: 1. Normal intracranial CTA. 2. No carotid dissection, aneurysm or stenosis. 3. 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 Electronically Signed   By: Ulyses Jarred M.D.   On: 03/16/2017 23:59    EKG: Sinus at 90bpm, normal axis, nonspecific ST T wave changes.  IMPRESSION AND PLAN:  This is a 46 y.o. female with a history of breast cancer, HTN, CKD   now being admitted with:  1. Facial paresthesia, rule out TIA/CVA - Admit telemetry observation for neuro workup including: - Studies: MRA/MRI, Echo, Carotids - Labs: CBC, BMP, Lipids, TFTs, A1C - Nursing: Neurochecks, O2, dysphagia screen, permissive hypertension (hold BP meds).  - Consults: Neurology, PT/OT, S/S consults.  - Meds: Daily aspirin 81mg .   - Fluids: IVNS@75cc /hr.   - Routine DVT Px: with Lovenox, SCDs, early ambulation  2. H/O HTN - Hold BP meds  3. H/O iron deficiency - Continue iron  4. H/O CKD, Cr slightly elevated from baseline - Monitor BMP  Code Status: Full  All the records are reviewed and case discussed with ED provider. Management plans discussed with the patient and/or family who express understanding and agree with plan of care.   TOTAL TIME TAKING CARE OF THIS PATIENT: 45 minutes.   Duana Benedict D.O. on 03/17/2017 at 12:54 AM Between 7am to 6pm - Pager - [phone] After 6pm go to [email or web address] - [employer] [facility] Office [phone] CC: Primary care physician; Marda Stalker, PA-C     Note: This dictation was prepared with Dragon dictation along with smaller phrase technology. Any transcriptional errors that result from this process are unintentional.

## 2017-03-17 NOTE — Progress Notes (Signed)
Patient passed the bedside swallowing screen. Order changed to 2 g Na per Dr. Estanislado Pandy. Patient is aware. Will continue to monitor.

## 2017-03-17 NOTE — Evaluation (Signed)
Occupational Therapy Evaluation Patient Details Name: [name] [name] MRN: [id number] DOB: 02/11/1971 Today's Date: 03/17/2017    History of Present Illness 46 y.o.femalewith a known history of breast cancer, HTN, CKDpresenting with right sided facial numbness and weakness in ED on 4/15. Symptoms persist. MRI of the brain reviewed and shows no acute changes. CTA of the head and neck are unremarkable. Per neurology, unclear etiology of symptoms, can not rule out the possibility of an early Bell's palsy.    Clinical Impression   Pt seen for OT evaluation this date. Pt reports continued numbness in R side of face, no other symptoms at this time. Pt able to demonstrate baseline independence with all self care tasks with good safety awareness, no LOB, and no concerns. Pt eager to return home, as she feels she is at her baseline except for the sensory change in her face. Good even strength bilaterally. Pt reports vision is blurry up close, and wears glasses to address this (not a new visual change), however, left glasses at home. No additional OT needs at this time. Will sign off. Please re-consult should additional OT needs arise.     Follow Up Recommendations  No OT follow up    Equipment Recommendations  None recommended by OT    Recommendations for Other Services       Precautions / Restrictions Precautions Precautions: None Restrictions Weight Bearing Restrictions: No      Mobility Bed Mobility Overal bed mobility: Independent             General bed mobility comments: did not formally assess, pt up in recliner at start of session, pt reports indep with bed mobility, RN reports the same  Transfers Overall transfer level: Independent Equipment used: None             General transfer comment: indep with sit to stand transfers from recliner and from std height toilet; good balance, safety awareness, no LOB    Balance Overall balance assessment: Independent                                         ADL either performed or assessed with clinical judgement   ADL Overall ADL's : At baseline;Independent                                       General ADL Comments: pt able to perform all basic self care tasks as baseline independent level, no LOB, good safety awareness     Vision Baseline Vision/History: Wears glasses Wears Glasses: At all times Patient Visual Report: No change from baseline (reports being far sighted, left glasses at home) Vision Assessment?: Yes Eye Alignment: Within Functional Limits Ocular Range of Motion: Within Functional Limits Alignment/Gaze Preference: Within Defined Limits Tracking/Visual Pursuits: Able to track stimulus in all quads without difficulty Convergence: Within functional limits Visual Fields: No apparent deficits     Perception     Praxis Praxis Praxis tested?: Within functional limits    Pertinent Vitals/Pain Pain Assessment: No/denies pain     Hand Dominance     Extremity/Trunk Assessment Upper Extremity Assessment Upper Extremity Assessment: Overall WFL for tasks assessed   Lower Extremity Assessment Lower Extremity Assessment: Defer to PT evaluation;Overall [facility] for tasks assessed   Cervical / Trunk Assessment Cervical / Trunk Assessment: Normal  Communication Communication Communication: No difficulties   Cognition Arousal/Alertness: Awake/alert Behavior During Therapy: WFL for tasks assessed/performed Overall Cognitive Status: Within Functional Limits for tasks assessed                                     General Comments       Exercises     Shoulder Instructions      Home Living Family/patient expects to be discharged to:: Private residence Living Arrangements: Spouse/significant other;Children (5 kids, ages 34yo - 25yo living at home) Available Help at Discharge: Family;Available PRN/intermittently;Available 24 hours/day Type of  Home: House Home Access: Level entry     Home Layout: Multi-level;Bed/bath upstairs Alternate Level Stairs-Number of Steps: bed/bath on 2nd floor   Bathroom Shower/Tub: Walk-in shower;Door   [employer] Toilet: Programmer, systems: Yes How Accessible: Accessible via walker Home Equipment: None          Prior Functioning/Environment Level of Independence: Independent        Comments: pt reports indep with all ADL, IADL including driving, working (desk/computer work), caring for children, household mgt tasks; no falls reported in past 12 months        OT Problem List:        OT Treatment/Interventions:      OT Goals(Current goals can be found in the care plan section) Acute Rehab OT Goals Patient Stated Goal: go home OT Goal Formulation: With patient  OT Frequency:     Barriers to D/C:            Co-evaluation              End of Session    Activity Tolerance: Patient tolerated treatment well Patient left: in chair;with call bell/phone within reach  OT Visit Diagnosis: Other symptoms and signs involving cognitive function                Time: 1108-1130 OT Time Calculation (min): 22 min Charges:  OT General Charges $OT Visit: 1 Procedure OT Evaluation $OT Eval Low Complexity: 1 Procedure G-Codes: OT G-codes **NOT FOR INPATIENT CLASS** Functional Assessment Tool Used: AM-PAC 6 Clicks Daily Activity;Clinical judgement Functional Limitation: Self care Self Care Current Status (I9518): 0 percent impaired, limited or restricted Self Care Goal Status (A4166): 0 percent impaired, limited or restricted Self Care Discharge Status (A6301): 0 percent impaired, limited or restricted   Jeni Salles, MPH, MS, OTR/L ascom [phone] 03/17/17, 12:18 PM

## 2017-03-17 NOTE — Progress Notes (Signed)
Patient requested an advance directives. When patient saw Chaplain entering her room, she said, "You are Chaplain, are you here to pray for me?" Chaplain responded that he would be honored to do so if it was patient's wishes. Chaplain provided education about filling out an AD and concluded with a prayer.   03/17/17 1200  Clinical Encounter Type  Visited With Patient  Visit Type Follow-up  Referral From Chaplain  Consult/Referral To Chaplain  Spiritual Encounters  Spiritual Needs Brochure;Prayer

## 2017-03-17 NOTE — Progress Notes (Signed)
Patient is alert and oriented x 4.  Admitted to room 235 with the diagnosis of TIA. Denied  any acute pain. No respiratory distress noted. Tele box reconciled to CCMD by Ninfa Meeker RN and Estill Bamberg NT. Skin assessment done with Alisa S., no skin issues to report. Password was set up. Patient voiced no complaint. Will continue to monitor.

## 2017-03-17 NOTE — Progress Notes (Signed)
Cardiology Office Note    Date:  03/18/2017   ID:  [name] Bartell, DOB [birth year], MRN [id number]  PCP:  Marda Stalker, PA-C  Cardiologist:   Candee Furbish, MD     History of Present Illness:  [name] [name] is a 46 y.o. female here for the Follow-up of difficult to control hypertension at the request of Marda Stalker, [place], Dr. Lynelle Doctor. Morbid obesity risk factor. She has a prior history of breast cancer in the year 2000.  She was just discharged on 03/17/17 at [facility] after she came in with left facial weakness and some limb numbness. MRI of brain was normal. Echocardiogram showed EF of 65-70%, unchanged. Carotid Dopplers were normal. Her blood pressure when seen by neurology was in the 023 systolic. Originally on arrival to emergency department it was 216/104. Neurology recommended aspirin. LDL was 67.She saw Dr. Alexis Goodell.  Prior to that, she had called the office with blood pressure on 03/04/17 of 244/114.  She has severe LVH on her echocardiogram which points towards long-standing hypertension.  Denies any chest pain, no significant shortness of breath today.   In review of my prior consult note: In the past she has been able to control her hypertension with systolics in the 343 range. 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  One time at work, her blood pressure was as high as 568 systolic. She went over to her doctor's office at that time. No neurologic sequelae.  She is not having any chest pain, no shortness of breath, no orthopnea.  She has had angioedema from ACE inhibitor's in the past.  She has had some increased swelling with use of amlodipine although off of amlodipine at baseline she still has issues with lower extremity/pedal edema.  Her atenolol has been increased up to 100 mg and  spironolactone HCTZ combination has been added as well.  In the past, her hemoglobin was Hg <7 , possible dysfunctional uterine bleeding from fibroids.   Father diabetic, no early family history of CAD    Past Medical History:  Diagnosis Date  . Breast cancer, left breast ([place]) 2000   Underwent lumpectomy, chemotherapy and radiation  . Hypertension   . Renal disorder     Past Surgical History:  Procedure Laterality Date  . BREAST LUMPECTOMY WITH AXILLARY LYMPH NODE BIOPSY Left 2000   biopsy    Current Medications: Outpatient Medications Prior to Visit  Medication Sig Dispense Refill  . aspirin EC 81 MG EC tablet Take 1 tablet (81 mg total) by mouth daily. 30 tablet 0  . cloNIDine (CATAPRES) 0.2 MG tablet Take 0.2 mg by mouth 3 (three) times daily as needed (high blood pressure).     . ferrous sulfate 325 (65 FE) MG tablet Take 1 tablet (325 mg total) by mouth daily. 30 tablet 0  . ibuprofen (ADVIL,MOTRIN) 200 MG tablet Take 400 mg by mouth every 6 (six) hours as needed. For pain.     . Multiple Vitamin (MULITIVITAMIN WITH MINERALS) TABS Take 1 tablet by mouth daily.      . nebivolol (BYSTOLIC) 10 MG tablet Take 1 tablet (10 mg total) by mouth daily. 30 tablet 11  . spironolactone (ALDACTONE) 25 MG tablet Take 25 mg by mouth daily.     No facility-administered medications prior to visit.  Allergies:   Ace inhibitors angioedema  Social History   Social History  . Marital status: Married    Spouse name: N/A  . Number of children: N/A  . Years of education: N/A   Social History Main Topics  . Smoking status: Never Smoker  . Smokeless tobacco: Never Used  . Alcohol use No  . Drug use: No  . Sexual activity: Yes    Partners: Male    Birth control/ protection: Surgical   Other Topics Concern  . None   Social History Narrative  . None     Family History:  No early CAD   ROS:   Please see the history of present illness.    ROS All other systems reviewed  and are negative.   PHYSICAL EXAM:   VS:  BP (!) 196/112   Pulse 60   Ht 4\' 11"  (1.499 m)   Wt 199 lb (90.3 kg)   SpO2 98%   BMI 40.19 kg/m    GEN: Well nourished, well developed, in no acute distress  HEENT: normal  Neck: no JVD, carotid bruits, or masses Cardiac: RRR; no murmurs, rubs, or gallops,no edema  Respiratory:  clear to auscultation bilaterally, normal work of breathing GI: soft, nontender, nondistended, + BS, obese MS: no deformity or atrophy  Skin: warm and dry, no rash Neuro:  Alert and Oriented x 3, Strength and sensation are intact Psych: euthymic mood, full affect   Wt Readings from Last 3 Encounters:  03/18/17 199 lb (90.3 kg)  03/17/17 200 lb 8 oz (90.9 kg)  01/28/17 197 lb 9.6 oz (89.6 kg)      Studies/Labs Reviewed:   EKG:   01/28/17-sinus bradycardia rate 54 rightward axis, T-wave inversion in 3, F, V3 through V6. Personally viewed  Recent Labs: 03/16/2017: BUN 32; Creatinine, Ser 1.33; Hemoglobin 12.3; Platelets 194; Potassium 3.5; Sodium 137   Lipid Panel    Component Value Date/Time   CHOL 126 03/17/2017 0410   TRIG 49 03/17/2017 0410   HDL 49 03/17/2017 0410   CHOLHDL 2.6 03/17/2017 0410   VLDL 10 03/17/2017 0410   LDLCALC 67 03/17/2017 0410    Additional studies/ records that were reviewed today include:  Prior office notes, lab work, renal ultrasound-normal reviewed EKG reviewed     ASSESSMENT:    No diagnosis found.   PLAN:  In order of problems listed above:  Difficult to control hypertension  - Bystolic increase 20 mg once a day  - Increase spironolactone 50 mg  - Continue clonidine when necessary  - She will log her blood pressures at home.  - Prior renal ultrasound was normal.I will check renal Dopplers.  - I will also check urine metanephrines and catecholamines.  - ? fibromuscular dysplasia, however based upon her severe LVH myalgias that her hypertension has been long-standing for quite some time. Renal Doppler  pending.  - I will also place referral in to Dr. Jimmy Footman for him to review her case and offer any consultation.  Heart murmur  -Both flow murmur from hyperdynamic contraction and mild mitral regurgitation as well.   Morbid obesity  - Continue to encourage weight loss.   TIA  - I asked her to address her questions with Dr. Doy Mince with neurology and Loma Sousa her primary provider.  We will see her back in clinic in 4 weeks with APP.   Medication Adjustments/Labs and Tests Ordered: Current medicines are reviewed at length with the patient today.  Concerns regarding medicines are outlined  above.  Medication changes, Labs and Tests ordered today are listed in the Patient Instructions below. There are no Patient Instructions on file for this visit.   Signed, Candee Furbish, MD  03/18/2017 9:19 AM    [facility] [place], [place], [place]  [address] Phone: [phone]; Fax: [phone]

## 2017-03-17 NOTE — ED Notes (Signed)
Admitting Provider at bedside. 

## 2017-03-17 NOTE — Progress Notes (Signed)
Pt has orders to be discharged. Discharge instructions given and pt has no additional questions at this time. Medication regimen reviewed and pt educated. Pt verbalized understanding and has no additional questions. Telemetry box removed. IV removed and site in good condition. Pt stable and waiting for transportation.   Josip Merolla RN 

## 2017-03-17 NOTE — Progress Notes (Signed)
*  PRELIMINARY RESULTS* Echocardiogram 2D Echocardiogram has been performed.  [name] [name] 03/17/2017, 2:43 PM

## 2017-03-18 ENCOUNTER — Encounter: Payer: Self-pay | Admitting: Cardiology

## 2017-03-18 ENCOUNTER — Ambulatory Visit (INDEPENDENT_AMBULATORY_CARE_PROVIDER_SITE_OTHER): Payer: 59 | Admitting: Cardiology

## 2017-03-18 VITALS — BP 196/112 | HR 60 | Ht 59.0 in | Wt 199.0 lb

## 2017-03-18 DIAGNOSIS — G459 Transient cerebral ischemic attack, unspecified: Secondary | ICD-10-CM

## 2017-03-18 DIAGNOSIS — I10 Essential (primary) hypertension: Secondary | ICD-10-CM | POA: Diagnosis not present

## 2017-03-18 LAB — HEMOGLOBIN A1C
Hgb A1c MFr Bld: 6 % — ABNORMAL HIGH (ref 4.8–5.6)
Mean Plasma Glucose: 126 mg/dL

## 2017-03-18 LAB — HIV ANTIBODY (ROUTINE TESTING W REFLEX): HIV Screen 4th Generation wRfx: NONREACTIVE

## 2017-03-18 MED ORDER — NEBIVOLOL HCL 20 MG PO TABS: 20.0000 mg | ORAL_TABLET | Freq: Every day | ORAL | 6 refills | Status: DC

## 2017-03-18 MED ORDER — SPIRONOLACTONE 50 MG PO TABS: 50.0000 mg | ORAL_TABLET | Freq: Every day | ORAL | 6 refills | Status: DC

## 2017-03-18 NOTE — Patient Instructions (Signed)
Medication Instructions:  Please increase Spironolactone to 50 mg a day and Bystolic to 20 mg a day. Continue all other medications as listed.   Labwork: Please have 24 hour urine for catecholamines and metanephrine.  Testing/Procedures: Your physician has requested that you have a renal artery duplex. During this test, an ultrasound is used to evaluate blood flow to the kidneys. Allow one hour for this exam. Do not eat after midnight the day before and avoid carbonated beverages. Take your medications as you usually do.  You have been referred to [name] [name].  Follow-Up: Follow up in 1 month with [name] Leitz, PA.  If you need a refill on your cardiac medications before your next appointment, please call your pharmacy.  Thank you for choosing [facility]!!

## 2017-03-24 DIAGNOSIS — I10 Essential (primary) hypertension: Secondary | ICD-10-CM | POA: Diagnosis not present

## 2017-03-24 DIAGNOSIS — N289 Disorder of kidney and ureter, unspecified: Secondary | ICD-10-CM | POA: Diagnosis not present

## 2017-03-24 DIAGNOSIS — G510 Bell's palsy: Secondary | ICD-10-CM | POA: Diagnosis not present

## 2017-04-07 ENCOUNTER — Inpatient Hospital Stay (HOSPITAL_COMMUNITY): Admission: RE | Admit: 2017-04-07 | Payer: 59 | Source: Ambulatory Visit

## 2017-04-09 DIAGNOSIS — Z0000 Encounter for general adult medical examination without abnormal findings: Secondary | ICD-10-CM | POA: Diagnosis not present

## 2017-04-14 DIAGNOSIS — I10 Essential (primary) hypertension: Secondary | ICD-10-CM | POA: Diagnosis not present

## 2017-04-16 NOTE — Progress Notes (Signed)
Cardiology Office Note    Date:  04/17/2017   ID:  [name] [name], DOB 1971/06/21, MRN [id number]  PCP:  Marda Stalker, PA-C  Cardiologist: Dr. Marlou Porch   CC: follow up HTN   History of present illness   [name] [name] is a 46 y.o. female with a history of difficult to control hypertension, breast cancer s/p chemo/rad/lumpectomy, morbid obesity, TIA and fibroids with dysfunctional uterine bleeding who presents to clinic for follow up.   She has had angioedema from ACE inhibitors in the past. She had worsening of chronic LE edema with amlodipine. Previous renal ultrasound was normal. She was seen as a new patient by Dr. Marlou Porch on 01/28/17. Atenolol 100mg  daily was discontinued and Bystolic 10mg  daily was started. She was continued on spironolactone 25 mg/HCTZ 12.5 mg and PRN clonidine. Plan was to increase bystolic to 20mg  daily or add amlodipine at 2.5 mg which will help to minimize the edema if more BP control was needed. A murmur was heard on exam and 2D ECHO 315/18 showed normal LV function (EF 75-80%) with severe LVH, G2DD, mod-severe LAE, mild RAE, PA pressure 43. The importance of getting her BP under control with severe LVH was emphasized. Murmur felt to be a flow murmur given vigorous LV function. She reported that her work caused her significant stress which she thought made her BP worse.  She was then admitted to [facility] in 03/2017 for left facial weakness and arm numbness. MRI of brain was normal. Repeat 2D ECHO 03/17/17 showed EF 65-70%, G1DD, mild MR, mild LAE. She was discharged on ASA 81 mg daily.  She saw Dr. Marlou Porch for follow up on 03/18/17 for follow up. Her BP remained quite elevated 196/112. Bystolic 10mg  daily was increased to 20mg  daily and spironolactone was increased to 50mg  daily. Of note, HCTZ 12.5mg  daily now not on med list. Renal dopplers as well as urine metanephrines and catecholamine were ordered, although never obtained. She was also referred to nephrology for further  work up.   Today he presents to clinic for follow up. She did collect a 24 hour urine. She was told it okay to drop off at [place] but it is still saying "active needs collection in our system." She also showed up to get renal duplex and was at the wrong office, so rescheduled to 5/31. She has bee keeping a log of BPs and BPs running in 130s / 80s. Sometimes she gets a BP spike and asks if she can take a clonidine. She was out on Mothers day and had a little bit of numbness in her feet. She sees a neurologist 04/25/17. No CP or SOB. No LE edema but wonders why her ankles "look so fat."  No orthopnea or PND. She occasionally has dizziness (has BPPV) but no syncope. No blood in stool or urine. No palpitations.     Past Medical History:  Diagnosis Date  . Breast cancer, left breast ([place]) 2000   Underwent lumpectomy, chemotherapy and radiation  . Hypertension   . Renal disorder     Past Surgical History:  Procedure Laterality Date  . BREAST LUMPECTOMY WITH AXILLARY LYMPH NODE BIOPSY Left 2000   biopsy    Current Medications: Outpatient Medications Prior to Visit  Medication Sig Dispense Refill  . aspirin EC 81 MG EC tablet Take 1 tablet (81 mg total) by mouth daily. 30 tablet 0  . cloNIDine (CATAPRES) 0.2 MG tablet Take 0.2 mg by mouth 3 (three) times daily as needed (high  blood pressure).     . ferrous sulfate 325 (65 FE) MG tablet Take 1 tablet (325 mg total) by mouth daily. 30 tablet 0  . ibuprofen (ADVIL,MOTRIN) 200 MG tablet Take 400 mg by mouth every 6 (six) hours as needed. For pain.     . Multiple Vitamin (MULITIVITAMIN WITH MINERALS) TABS Take 1 tablet by mouth daily.      . Nebivolol HCl 20 MG TABS Take 1 tablet (20 mg total) by mouth daily. 30 tablet 6  . spironolactone (ALDACTONE) 50 MG tablet Take 1 tablet (50 mg total) by mouth daily. 30 tablet 6   No facility-administered medications prior to visit.      Allergies:   Ace inhibitors and Latex   Social History     Social History  . Marital status: Married    Spouse name: N/A  . Number of children: N/A  . Years of education: N/A   Social History Main Topics  . Smoking status: Never Smoker  . Smokeless tobacco: Never Used  . Alcohol use No  . Drug use: No  . Sexual activity: Yes    Partners: Male    Birth control/ protection: Surgical   Other Topics Concern  . None   Social History Narrative  . None     Family History:  The patient's family history includes Hypertension in her father.     ROS:   Please see the history of present illness.    ROS All other systems reviewed and are negative.   PHYSICAL EXAM:   VS:  BP 134/84   Pulse 63   Ht 4\' 11"  (1.499 m)   Wt 202 lb 9.6 oz (91.9 kg)   SpO2 99%   BMI 40.92 kg/m    GEN: Well nourished, well developed, in no acute distress, obese HEENT: normal  Neck: no JVD, carotid bruits, or masses Cardiac: RRR; no murmurs, rubs, or gallops,no edema  Respiratory:  clear to auscultation bilaterally, normal work of breathing GI: soft, nontender, nondistended, + BS MS: no deformity or atrophy  Skin: warm and dry, no rash Neuro:  Alert and Oriented x 3, Strength and sensation are intact Psych: euthymic mood, full affect    Wt Readings from Last 3 Encounters:  04/17/17 202 lb 9.6 oz (91.9 kg)  03/18/17 199 lb (90.3 kg)  03/17/17 200 lb 8 oz (90.9 kg)      Studies/Labs Reviewed:   EKG:  EKG is NOT ordered today.   Recent Labs: 03/16/2017: BUN 32; Creatinine, Ser 1.33; Hemoglobin 12.3; Platelets 194; Potassium 3.5; Sodium 137   Lipid Panel    Component Value Date/Time   CHOL 126 03/17/2017 0410   TRIG 49 03/17/2017 0410   HDL 49 03/17/2017 0410   CHOLHDL 2.6 03/17/2017 0410   VLDL 10 03/17/2017 0410   LDLCALC 67 03/17/2017 0410    Additional studies/ records that were reviewed today include:  2D ECHO 02/13/2017  LV EF: 75% -  80%  Study Conclusions  - Left ventricle: The cavity size was normal. Wall thickness was   increased in a pattern of severe LVH. Systolic function was  vigorous. The estimated ejection fraction was in the range of 75%  to 80%. Wall motion was normal; there were no regional wall  motion abnormalities. Features are consistent with a pseudonormal  left ventricular filling pattern, with concomitant abnormal  relaxation and increased filling pressure (grade 2 diastolic  dysfunction).  - Left atrium: The atrium was moderately to severely dilated.  -  Right atrium: The atrium was mildly dilated.  - Pulmonary arteries: Systolic pressure was moderately increased.  PA peak pressure: 43 mm Hg (S).    2D ECHO: 03/17/2017 LV EF: 65% -   70% Study Conclusions - Left ventricle: The cavity size was normal. There was moderate   concentric hypertrophy. Systolic function was vigorous. The   estimated ejection fraction was in the range of 65% to 70%. Wall   motion was normal; there were no regional wall motion   abnormalities. Doppler parameters are consistent with abnormal   left ventricular relaxation (grade 1 diastolic dysfunction). - Mitral valve: There was mild regurgitation. - Left atrium: The atrium was mildly dilated. Impressions: - No cardiac source of emboli was indentified.   ASSESSMENT & PLAN:   HTN: BP is much better control. Average BPs at home 130s/80s. BP today 134/84. Continue Bystolic 20mg  daily and spironolactone 50mg  daily. She takes clonidine 0.2 PRN for high BPs. Dr. Marlou Porch ordered urine metanephrine/catecholamines and renal duplex. There was a mistake in collecting these and renal duplex rescheduled for 5/31. Before making her repeat a 24 hour urine, I will ask Dr. Marlou Porch if these are still necessary given controlled BPs  Severe LVH: we are working very hard to keep her BP < 140/90. Bps are in range by home log and she takes PRN clonidine if BP higher than goal  Morbid obesity: Body mass index is 40.92 kg/m.  Counseled on importance of diet and  exercise.   TIA: continue ASA. She sees neurology this month  Murmur: felt to be a high flow murmur or from mild MR.  Medication Adjustments/Labs and Tests Ordered: Current medicines are reviewed at length with the patient today.  Concerns regarding medicines are outlined above.  Medication changes, Labs and Tests ordered today are listed in the Patient Instructions below. Patient Instructions  Medication Instructions:  Your physician recommends that you continue on your current medications as directed. Please refer to the Current Medication list given to you today.   Labwork: None ordered  Testing/Procedures: None ordered  Follow-Up: Your physician wants you to follow-up in: [place] will receive a reminder letter in the mail two months in advance. If you don't receive a letter, please call our office to schedule the follow-up appointment.   Any Other Special Instructions Will Be Listed Below (If Applicable).     If you need a refill on your cardiac medications before your next appointment, please call your pharmacy.      Signed, Angelena Form, PA-C  04/17/2017 9:16 AM    [facility] [place], [place], [place]  [address] Phone: [phone]; Fax: [phone]

## 2017-04-17 ENCOUNTER — Ambulatory Visit (INDEPENDENT_AMBULATORY_CARE_PROVIDER_SITE_OTHER): Payer: 59 | Admitting: Physician Assistant

## 2017-04-17 ENCOUNTER — Encounter: Payer: Self-pay | Admitting: Physician Assistant

## 2017-04-17 VITALS — BP 134/84 | HR 63 | Ht 59.0 in | Wt 202.6 lb

## 2017-04-17 DIAGNOSIS — R011 Cardiac murmur, unspecified: Secondary | ICD-10-CM

## 2017-04-17 DIAGNOSIS — G459 Transient cerebral ischemic attack, unspecified: Secondary | ICD-10-CM

## 2017-04-17 DIAGNOSIS — I10 Essential (primary) hypertension: Secondary | ICD-10-CM | POA: Diagnosis not present

## 2017-04-17 DIAGNOSIS — I517 Cardiomegaly: Secondary | ICD-10-CM | POA: Diagnosis not present

## 2017-04-17 NOTE — Patient Instructions (Signed)

## 2017-04-18 ENCOUNTER — Encounter: Payer: Self-pay | Admitting: *Deleted

## 2017-04-21 ENCOUNTER — Ambulatory Visit (INDEPENDENT_AMBULATORY_CARE_PROVIDER_SITE_OTHER): Payer: 59 | Admitting: Diagnostic Neuroimaging

## 2017-04-21 ENCOUNTER — Telehealth: Payer: Self-pay | Admitting: *Deleted

## 2017-04-21 ENCOUNTER — Encounter: Payer: Self-pay | Admitting: *Deleted

## 2017-04-21 VITALS — BP 165/85 | HR 59 | Ht 59.0 in | Wt 202.6 lb

## 2017-04-21 DIAGNOSIS — R200 Anesthesia of skin: Secondary | ICD-10-CM | POA: Diagnosis not present

## 2017-04-21 DIAGNOSIS — G500 Trigeminal neuralgia: Secondary | ICD-10-CM | POA: Diagnosis not present

## 2017-04-21 DIAGNOSIS — M5481 Occipital neuralgia: Secondary | ICD-10-CM

## 2017-04-21 DIAGNOSIS — R202 Paresthesia of skin: Secondary | ICD-10-CM

## 2017-04-21 NOTE — Progress Notes (Signed)
[facility]  PATIENT: [name] [name] DOB: February 10, 1971  REFERRING CLINICIAN: C Wharton HISTORY FROM: patient  REASON FOR VISIT: new consult    HISTORICAL  CHIEF COMPLAINT:  Chief Complaint  Patient presents with  . Facial paralysis, right side    rm 6, New Pt, "right facial paralysis began suddenly 03/18/17; it has improved a great deal but is numb; feel strikes on right side of head, right-sided headaches"    HISTORY OF PRESENT ILLNESS:   46 year old female here for evaluation of right facial numbness and weakness.  03/16/17 patient had sudden onset lightning strike sensation in the back of her head on the right side radiating to her right eye and right face. Then patient felt numbness in her right face. Patient felt some mild slurred speech. She felt some mild heaviness in her right face. Patient went to the hospital was admitted for stroke workup. Neurologic examination the hospital was notable for slight right lower facial weakness. MRI was negative for acute stroke. Stroke workup was completed. Symptoms gradually resolved.  Since that time patient continues to have some mild abnormal sensation of the right side of her face. She had one more attack of pain on the right side of her head.  No history of headaches. No migraine headaches. She does have hypertension. Has history of breast cancer.    REVIEW OF SYSTEMS: Full 14 system review of systems performed and negative with exception of: Headache numbness blurred vision palpitation ringing in ears.   ALLERGIES: Allergies  Allergen Reactions  . Ace Inhibitors Swelling  . Latex     Severe itching and rash  . Lisinopril Swelling    Swelling of lips, face    HOME MEDICATIONS: Outpatient Medications Prior to Visit  Medication Sig Dispense Refill  . aspirin EC 81 MG EC tablet Take 1 tablet (81 mg total) by mouth daily. 30 tablet 0  . cloNIDine (CATAPRES) 0.2 MG tablet Take 0.2 mg by mouth 3 (three) times  daily as needed (high blood pressure).     . ferrous sulfate 325 (65 FE) MG tablet Take 1 tablet (325 mg total) by mouth daily. 30 tablet 0  . ibuprofen (ADVIL,MOTRIN) 200 MG tablet Take 400 mg by mouth every 6 (six) hours as needed. For pain.     . Multiple Vitamin (MULITIVITAMIN WITH MINERALS) TABS Take 1 tablet by mouth daily.      . Nebivolol HCl 20 MG TABS Take 1 tablet (20 mg total) by mouth daily. 30 tablet 6  . spironolactone (ALDACTONE) 50 MG tablet Take 1 tablet (50 mg total) by mouth daily. 30 tablet 6   No facility-administered medications prior to visit.     PAST MEDICAL HISTORY: Past Medical History:  Diagnosis Date  . Breast cancer, left breast ([place]) 2000   Underwent lumpectomy, chemotherapy and radiation  . Hypertension   . Renal disorder   . TIA (transient ischemic attack) 03/16/2017   [facility]    PAST SURGICAL HISTORY: Past Surgical History:  Procedure Laterality Date  . BREAST LUMPECTOMY WITH AXILLARY LYMPH NODE BIOPSY Left 2000   biopsy  . TUBAL LIGATION Bilateral 2004    FAMILY HISTORY: Family History  Problem Relation Age of Onset  . Hypertension Mother   . Hypertension Father   . Diverticulosis Father   . Diabetes Father   . Hypertension Maternal Grandmother   . CAD Maternal Grandmother   . Hypertension Paternal Grandmother   . CAD Paternal Grandmother   . Stroke Paternal  Grandfather     SOCIAL HISTORY:  Social History   Social History  . Marital status: Married    Spouse name: [name]  . Number of children: 3  . Years of education: 47   Occupational History  .      office admin. [employer]   Social History Main Topics  . Smoking status: Never Smoker  . Smokeless tobacco: Never Used  . Alcohol use No  . Drug use: No  . Sexual activity: Yes    Partners: Male    Birth control/ protection: Surgical   Other Topics Concern  . Not on file   Social History Narrative   Lives with family   No caffeine     PHYSICAL  EXAM  GENERAL EXAM/CONSTITUTIONAL: Vitals:  Vitals:   04/21/17 1458  BP: (!) 165/85  Pulse: (!) 59  Weight: 202 lb 9.6 oz (91.9 kg)  Height: 4\' 11"  (1.499 m)     Body mass index is 40.92 kg/m.  Visual Acuity Screening   Right eye Left eye Both eyes  Without correction:     With correction: 20/30 20/40      Patient is in no distress; well developed, nourished and groomed; neck is supple  CARDIOVASCULAR:  Examination of carotid arteries is normal; no carotid bruits  Regular rate and rhythm, no murmurs  Examination of peripheral vascular system by observation and palpation is normal  EYES:  Ophthalmoscopic exam of optic discs and posterior segments is normal; no papilledema or hemorrhages  MUSCULOSKELETAL:  Gait, strength, tone, movements noted in Neurologic exam below  NEUROLOGIC: MENTAL STATUS:  No flowsheet data found.  awake, alert, oriented to person, place and time  recent and remote memory intact  normal attention and concentration  language fluent, comprehension intact, naming intact,   fund of knowledge appropriate  CRANIAL NERVE:   2nd - no papilledema on fundoscopic exam  2nd, 3rd, 4th, 6th - pupils equal and reactive to light, visual fields full to confrontation, extraocular muscles intact, no nystagmus  5th - facial sensation --> DECR RIGHT V2 SENS TO TEMP AND LIGHT TOUCH  7th - facial strength symmetric  8th - hearing intact  9th - palate elevates symmetrically, uvula midline  11th - shoulder shrug symmetric  12th - tongue protrusion midline  MOTOR:   normal bulk and tone, full strength in the BUE, BLE  SENSORY:   normal and symmetric to light touch, temperature, vibration  COORDINATION:   finger-nose-finger, fine finger movements normal  REFLEXES:   deep tendon reflexes present and symmetric  GAIT/STATION:   narrow based gait     DIAGNOSTIC DATA (LABS, IMAGING, TESTING) - I reviewed patient records, labs,  notes, testing and imaging myself where available.  Lab Results  Component Value Date   WBC 6.2 03/16/2017   HGB 12.3 03/16/2017   HCT 37.9 03/16/2017   MCV 77.5 (L) 03/16/2017   PLT 194 03/16/2017      Component Value Date/Time   NA 137 03/16/2017 2207   K 3.5 03/16/2017 2207   CL 104 03/16/2017 2207   CO2 25 03/16/2017 2207   GLUCOSE 136 (H) 03/16/2017 2207   BUN 32 (H) 03/16/2017 2207   CREATININE 1.33 (H) 03/16/2017 2207   CALCIUM 10.1 03/16/2017 2207   GFRNONAA 47 (L) 03/16/2017 2207   GFRAA 55 (L) 03/16/2017 2207   Lab Results  Component Value Date   CHOL 126 03/17/2017   HDL 49 03/17/2017   LDLCALC 67 03/17/2017   TRIG  49 03/17/2017   CHOLHDL 2.6 03/17/2017   Lab Results  Component Value Date   HGBA1C 6.0 (H) 03/17/2017   No results found for: VITAMINB12 Lab Results  Component Value Date   TSH 1.577 08/02/2014    03/16/17 CT head [I reviewed images myself and agree with interpretation. -VRP]  - Normal noncontrast CT appearance of the brain.  03/16/17 CT angiogram head/neck 1. Normal intracranial CTA. 2. No carotid dissection, aneurysm or stenosis. 3. 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  03/17/17 MRI / MRA brain [I reviewed images myself and agree with interpretation. -VRP]  - Negative head MRI and MRA.  03/17/17 carotid u/s  - Normal carotid Doppler ultrasound.  03/17/17 TTE - No cardiac source of emboli was indentified.      ASSESSMENT AND PLAN  46 y.o. year old female here with transient right occipital and right facial pain sensation (1 second) followed by right facial numbness and tingling. TIA/stroke workup completed and was overall unremarkable. Patient's main risk factor is hypertension.  Alternate consideration of patient's symptoms could include postviral trigeminal neuralgia/occipital neuralgia.   Ddx: post-viral trigeminal neuralgia + occipital neuralgia + hypertension  1. Numbness and  tingling of right face   2. Trigeminal neuralgia of right side of face   3. Occipital neuralgia of right side      PLAN: - monitor symptoms; if no improvement over next 4-6 weeks, then will check MRI brain with trigeminal nerve protocol (with and without)  - continue stroke risk factor mgmt (BP control, aspirin) - ok to return to work from neurology standpoint  Return in about 3 months (around 07/22/2017).    Penni Bombard, MD 08/05/91, 3:30 PM Certified in Neurology, Neurophysiology and Neuroimaging  [facility] [address], [place], [place] [address] [phone]

## 2017-04-21 NOTE — Telephone Encounter (Signed)
Pt has been made aware, per Dr. Marlou Porch / Nell Range, PA-C, that she does not need to proceed with the Renal US or the Urine studies, since her bp is controlled on her current regimen. [place] has been cancelled.  Pt verbalized appreciation and understanding.

## 2017-04-21 NOTE — Progress Notes (Signed)
Can you call pt and let her know we are going to cancel renal duplex (  I think scheduled for 5/31 ) and we dont have to repeat urine studies

## 2017-04-21 NOTE — Patient Instructions (Signed)
Thank you for coming to see [place] at [facility]. I hope we have been able to provide you high quality care today.  You may receive a patient satisfaction survey over the next few weeks. We would appreciate your feedback and comments so that we may continue to improve ourselves and the health of our patients.   - monitor symptoms; if no improvement over next 4-6 weeks, then will check MRI brain with trigeminal nerve protocol (with and without)   - continue stroke prevention (BP control, aspirin)  - ok to return to work from neurology standpoint    ~~~~~~~~~~~~~~~~~~~~~~~~~~~~~~~~~~~~~~~~~~~~~~~~~~~~~~~~~~~~~~~~~  DR. Hser Belanger'S GUIDE TO HAPPY AND HEALTHY LIVING These are some of my general health and wellness recommendations. Some of them may apply to you better than others. Please use common sense as you try these suggestions and feel free to ask me any questions.   ACTIVITY/FITNESS Mental, social, emotional and physical stimulation are very important for brain and body health. Try learning a new activity (arts, music, language, sports, games).  Keep moving your body to the best of your abilities. You can do this at home, inside or outside, the park, community center, gym or anywhere you like. Consider a physical therapist or personal trainer to get started. Consider the app Sworkit. Fitness trackers such as smart-watches, smart-phones or Fitbits can help as well.   NUTRITION Eat more plants: colorful vegetables, nuts, seeds and berries.  Eat less sugar, salt, preservatives and processed foods.  Avoid toxins such as cigarettes and alcohol.  Drink water when you are thirsty. Warm water with a slice of lemon is an excellent morning drink to start the day.  Consider these websites for more information The Nutrition Source (https://www.henry-hernandez.biz/) Precision Nutrition (WindowBlog.ch)   RELAXATION Consider  practicing mindfulness meditation or other relaxation techniques such as deep breathing, prayer, yoga, tai chi, massage. See website mindful.org or the apps Headspace or Calm to help get started.   SLEEP Try to get at least 7-8+ hours sleep per day. Regular exercise and reduced caffeine will help you sleep better. Practice good sleep hygeine techniques. See website sleep.org for more information.   PLANNING Prepare estate planning, living will, healthcare POA documents. Sometimes this is best planned with the help of an attorney. Theconversationproject.org and agingwithdignity.org are excellent resources.

## 2017-04-21 NOTE — Telephone Encounter (Signed)
-----   Message from Eileen Stanford, [place] sent at 04/21/2017  1:51 PM EDT -----   ----- Message ----- From: Jerline Pain, MD Sent: 04/21/2017   1:11 PM To: Eileen Stanford, PA-C  Good call. Lets cancel the renal duplex and the urine studies since she is well controlled on current reg.   Candee Furbish, MD  ----- Message ----- From: Crista Luria Sent: 04/17/2017   9:20 AM To: Jerline Pain, MD  Pt has hx of difficult to control HTN. You ordered urine metanephrine/catecholamines and renal duplex. There was a mistake in collecting these and renal duplex rescheduled for 5/31  (she went to wrong office for apt). Before making her repeat a 24 hour urine, do you still want this? Just checking since her BPs have been really well controlled on spiro and Bystolic. Also she wants to know if she needs renal duplex. Thanks!

## 2017-04-25 ENCOUNTER — Ambulatory Visit: Payer: 59 | Admitting: Neurology

## 2017-04-25 DIAGNOSIS — D509 Iron deficiency anemia, unspecified: Secondary | ICD-10-CM | POA: Diagnosis not present

## 2017-04-25 DIAGNOSIS — I129 Hypertensive chronic kidney disease with stage 1 through stage 4 chronic kidney disease, or unspecified chronic kidney disease: Secondary | ICD-10-CM | POA: Diagnosis not present

## 2017-04-25 DIAGNOSIS — R3129 Other microscopic hematuria: Secondary | ICD-10-CM | POA: Diagnosis not present

## 2017-04-25 DIAGNOSIS — N183 Chronic kidney disease, stage 3 (moderate): Secondary | ICD-10-CM | POA: Diagnosis not present

## 2017-05-01 ENCOUNTER — Encounter (HOSPITAL_COMMUNITY): Payer: 59

## 2017-05-02 ENCOUNTER — Emergency Department (HOSPITAL_COMMUNITY)
Admission: EM | Admit: 2017-05-02 | Discharge: 2017-05-02 | Disposition: A | Discharge status: Home / Self Care | Payer: 59 | Attending: Physician Assistant | Admitting: Physician Assistant

## 2017-05-02 ENCOUNTER — Encounter (HOSPITAL_COMMUNITY): Payer: Self-pay | Admitting: Neurology

## 2017-05-02 ENCOUNTER — Emergency Department (HOSPITAL_COMMUNITY): Payer: 59

## 2017-05-02 DIAGNOSIS — R29818 Other symptoms and signs involving the nervous system: Secondary | ICD-10-CM | POA: Diagnosis not present

## 2017-05-02 DIAGNOSIS — Z7982 Long term (current) use of aspirin: Secondary | ICD-10-CM | POA: Insufficient documentation

## 2017-05-02 DIAGNOSIS — Z853 Personal history of malignant neoplasm of breast: Secondary | ICD-10-CM | POA: Diagnosis not present

## 2017-05-02 DIAGNOSIS — I639 Cerebral infarction, unspecified: Secondary | ICD-10-CM

## 2017-05-02 DIAGNOSIS — I10 Essential (primary) hypertension: Secondary | ICD-10-CM | POA: Diagnosis not present

## 2017-05-02 DIAGNOSIS — R531 Weakness: Secondary | ICD-10-CM | POA: Diagnosis not present

## 2017-05-02 DIAGNOSIS — G510 Bell's palsy: Secondary | ICD-10-CM | POA: Diagnosis not present

## 2017-05-02 DIAGNOSIS — R29810 Facial weakness: Secondary | ICD-10-CM | POA: Diagnosis present

## 2017-05-02 DIAGNOSIS — Z8673 Personal history of transient ischemic attack (TIA), and cerebral infarction without residual deficits: Secondary | ICD-10-CM | POA: Diagnosis not present

## 2017-05-02 DIAGNOSIS — Z79899 Other long term (current) drug therapy: Secondary | ICD-10-CM | POA: Diagnosis not present

## 2017-05-02 DIAGNOSIS — Z91040 Latex allergy status: Secondary | ICD-10-CM | POA: Insufficient documentation

## 2017-05-02 HISTORY — DX: Bell's palsy: G51.0

## 2017-05-02 LAB — COMPREHENSIVE METABOLIC PANEL
ALT: 22 U/L (ref 14–54)
AST: 20 U/L (ref 15–41)
Albumin: 3.6 g/dL (ref 3.5–5.0)
Alkaline Phosphatase: 69 U/L (ref 38–126)
Anion gap: 8 (ref 5–15)
BUN: 19 mg/dL (ref 6–20)
CO2: 19 mmol/L — ABNORMAL LOW (ref 22–32)
Calcium: 10.3 mg/dL (ref 8.9–10.3)
Chloride: 110 mmol/L (ref 101–111)
Creatinine, Ser: 1.1 mg/dL — ABNORMAL HIGH (ref 0.44–1.00)
GFR calc Af Amer: >60 mL/min (ref >60)
GFR calc non Af Amer: 60 mL/min — ABNORMAL LOW (ref >60)
Glucose, Bld: 86 mg/dL (ref 65–99)
Potassium: 4.3 mmol/L (ref 3.5–5.1)
Sodium: 137 mmol/L (ref 135–145)
Total Bilirubin: 0.6 mg/dL (ref 0.3–1.2)
Total Protein: 7.1 g/dL (ref 6.5–8.1)

## 2017-05-02 LAB — I-STAT CHEM 8, ED
BUN: 25 mg/dL — ABNORMAL HIGH (ref 6–20)
Calcium, Ion: 1.37 mmol/L (ref 1.15–1.40)
Chloride: 109 mmol/L (ref 101–111)
Creatinine, Ser: 1.2 mg/dL — ABNORMAL HIGH (ref 0.44–1.00)
Glucose, Bld: 85 mg/dL (ref 65–99)
HCT: 34 % — ABNORMAL LOW (ref 36.0–46.0)
Hemoglobin: 11.6 g/dL — ABNORMAL LOW (ref 12.0–15.0)
Potassium: 4.5 mmol/L (ref 3.5–5.1)
Sodium: 139 mmol/L (ref 135–145)
TCO2: 22 mmol/L (ref 0–100)

## 2017-05-02 LAB — DIFFERENTIAL
Basophils Absolute: 0 10*3/uL (ref 0.0–0.1)
Basophils Relative: 0 %
Eosinophils Absolute: 0.1 10*3/uL (ref 0.0–0.7)
Eosinophils Relative: 2 %
Lymphocytes Relative: 34 %
Lymphs Abs: 2.1 10*3/uL (ref 0.7–4.0)
Monocytes Absolute: 0.5 10*3/uL (ref 0.1–1.0)
Monocytes Relative: 8 %
Neutro Abs: 3.5 10*3/uL (ref 1.7–7.7)
Neutrophils Relative %: 56 %

## 2017-05-02 LAB — CBC
HCT: 32.4 % — ABNORMAL LOW (ref 36.0–46.0)
Hemoglobin: 10.3 g/dL — ABNORMAL LOW (ref 12.0–15.0)
MCH: 25.3 pg — ABNORMAL LOW (ref 26.0–34.0)
MCHC: 31.8 g/dL (ref 30.0–36.0)
MCV: 79.6 fL (ref 78.0–100.0)
Platelets: 206 10*3/uL (ref 150–400)
RBC: 4.07 MIL/uL (ref 3.87–5.11)
RDW: 19.1 % — ABNORMAL HIGH (ref 11.5–15.5)
WBC: 6.3 10*3/uL (ref 4.0–10.5)

## 2017-05-02 LAB — PROTIME-INR
INR: 1.09
Prothrombin Time: 14.1 seconds (ref 11.4–15.2)

## 2017-05-02 LAB — APTT: aPTT: 32 seconds (ref 24–36)

## 2017-05-02 LAB — I-STAT TROPONIN, ED: Troponin i, poc: 0 ng/mL (ref 0.00–0.08)

## 2017-05-02 LAB — CBG MONITORING, ED: Glucose-Capillary: 75 mg/dL (ref 65–99)

## 2017-05-02 LAB — TSH: TSH: 2.286 u[IU]/mL (ref 0.350–4.500)

## 2017-05-02 LAB — ETHANOL: Alcohol, Ethyl (B): <5 mg/dL (ref <5)

## 2017-05-02 MED ORDER — MECLIZINE HCL 12.5 MG PO TABS: 12.5000 mg | ORAL_TABLET | Freq: Three times a day (TID) | ORAL | 0 refills | Status: DC | PRN

## 2017-05-02 MED ORDER — LORAZEPAM 2 MG/ML IJ SOLN
1.0000 mg | Freq: Once | INTRAMUSCULAR | Status: AC
  Administered 2017-05-02: 1 mg via INTRAVENOUS
  Filled 2017-05-02: qty 1

## 2017-05-02 NOTE — ED Provider Notes (Signed)
[facility] DEPT Provider Note   CSN: [id number] Arrival date & time: 05/02/17  1330     History   Chief Complaint No chief complaint on file.   HPI [name] [name] is a 46 y.o. female.  HPI   Patient is a 46 year old female presenting with left facial droop. Patient feels like her symptoms started this morning around 8 AM. Patient reports she recently had similar symptoms in the past in April. Patient also reports right arm weakness additional to the tingling in her face.Patient also reports ringing in her right ear.  Past Medical History:  Diagnosis Date  . Breast cancer, left breast ([place]) 2000   Underwent lumpectomy, chemotherapy and radiation  . Facial paralysis/Bells palsy    left side  . Hypertension   . Renal disorder   . TIA (transient ischemic attack) 03/16/2017   [facility]    Patient Active Problem List   Diagnosis Date Noted  . TIA (transient ischemic attack) 03/17/2017  . Fibroids 08/02/2014    Past Surgical History:  Procedure Laterality Date  . BREAST LUMPECTOMY WITH AXILLARY LYMPH NODE BIOPSY Left 2000   biopsy  . TUBAL LIGATION Bilateral 2004    OB History    Gravida Para Term Preterm AB Living   3 3 2 1  0 3   SAB TAB Ectopic Multiple Live Births   0 0 0 0        Obstetric Comments   Two adopted children Female age 6 and Female age 40.       Home Medications    Prior to Admission medications   Medication Sig Start Date End Date Taking? Authorizing Provider  aspirin EC 81 MG EC tablet Take 1 tablet (81 mg total) by mouth daily. 03/18/17  Yes Vaughan Basta, MD  ferrous sulfate 325 (65 FE) MG tablet Take 1 tablet (325 mg total) by mouth daily. 11/06/16 11/06/17 Yes Daymon Larsen, MD  Multiple Vitamin (MULITIVITAMIN WITH MINERALS) TABS Take 1 tablet by mouth daily.     Yes [provider]  cloNIDine (CATAPRES) 0.2 MG tablet Take 0.2 mg by mouth 3 (three) times daily as needed (high blood pressure).     [provider]  ibuprofen (ADVIL,MOTRIN) 200 MG tablet Take 400 mg by mouth every 6 (six) hours as needed. For pain.     [provider]  Nebivolol HCl 20 MG TABS Take 1 tablet (20 mg total) by mouth daily. 03/18/17   Jerline Pain, MD  spironolactone (ALDACTONE) 50 MG tablet Take 1 tablet (50 mg total) by mouth daily. 03/18/17   Jerline Pain, MD    Family History Family History  Problem Relation Age of Onset  . Hypertension Mother   . Hypertension Father   . Diverticulosis Father   . Diabetes Father   . Hypertension Maternal Grandmother   . CAD Maternal Grandmother   . Hypertension Paternal Grandmother   . CAD Paternal Grandmother   . Stroke Paternal Grandfather     Social History Social History  Substance Use Topics  . Smoking status: Never Smoker  . Smokeless tobacco: Never Used  . Alcohol use No     Allergies   Latex; Ace inhibitors; Amlodipine; Lisinopril; and Adhesive [tape]   Review of Systems Review of Systems  Constitutional: Negative for activity change.  Respiratory: Negative for shortness of breath.   Cardiovascular: Negative for chest pain.  Gastrointestinal: Negative for abdominal pain.  Neurological: Positive for dizziness, speech difficulty, weakness and numbness.  All other systems reviewed and are negative.    Physical Exam Updated Vital Signs BP (!) 168/86   Pulse 60   Temp 98.3 F (36.8 C)   Resp 16   SpO2 100%   Physical Exam  Constitutional: She is oriented to person, place, and time. She appears well-developed and well-nourished.  HENT:  Head: Normocephalic and atraumatic.  Eyes: Right eye exhibits no discharge.  Cardiovascular: Normal rate, regular rhythm and normal heart sounds.   No murmur heard. Pulmonary/Chest: Effort normal and breath sounds normal. She has no wheezes. She has no rales.  Abdominal: Soft. She exhibits no distension. There is no tenderness.  Neurological: She is oriented to person, place, and time.    Left facial droop. Sparing forehead. Right ear ringing.  Inconsistent neuro exam  Equal strength bilaterally upper and lower extremities negative pronator drift. Normal sensation bilaterally. Speech comprehensible, mild slurring.Alert and oriented 3.   Skin: Skin is warm and dry. She is not diaphoretic.  Psychiatric: She has a normal mood and affect.  Nursing note and vitals reviewed.    ED Treatments / Results  Labs (all labs ordered are listed, but only abnormal results are displayed) Labs Reviewed  CBC - Abnormal; Notable for the following:       Result Value   Hemoglobin 10.3 (*)    HCT 32.4 (*)    MCH 25.3 (*)    RDW 19.1 (*)    All other components within normal limits  COMPREHENSIVE METABOLIC PANEL - Abnormal; Notable for the following:    CO2 19 (*)    Creatinine, Ser 1.10 (*)    GFR calc non Af Amer 60 (*)    All other components within normal limits  I-STAT CHEM 8, ED - Abnormal; Notable for the following:    BUN 25 (*)    Creatinine, Ser 1.20 (*)    Hemoglobin 11.6 (*)    HCT 34.0 (*)    All other components within normal limits  ETHANOL  PROTIME-INR  APTT  DIFFERENTIAL  TSH  RAPID URINE DRUG SCREEN, HOSP PERFORMED  URINALYSIS, ROUTINE W REFLEX MICROSCOPIC  PREGNANCY, URINE  I-STAT TROPOININ, ED  CBG MONITORING, ED    EKG  EKG Interpretation None       Radiology Mr Brain Wo Contrast  Result Date: 05/02/2017 CLINICAL DATA:  Right arm weakness. Bell's palsy 1 month ago with residual facial droop. EXAM: MRI HEAD WITHOUT CONTRAST TECHNIQUE: Multiplanar, multiecho pulse sequences of the brain and surrounding structures were obtained without intravenous contrast. COMPARISON:  03/17/2017 FINDINGS: Brain: Normal appearance of the brain. No acute or remote infarction, hemorrhage, hydrocephalus, extra-axial collection or mass lesion. No findings in the brainstem, CP angle cistern, temporal bone, or parotid to explain facial droop. Vascular: Preserved flow  voids Skull and upper cervical spine: Greater than expected red marrow, stable in this patient with anemia on today's labs. No focal masslike findings. Sinuses/Orbits: Negative. IMPRESSION: Stable exam.  Normal appearance of the brain. Electronically Signed   By: Monte Fantasia M.D.   On: 05/02/2017 16:03   Ct Head Code Stroke Wo Contrast`  Result Date: 05/02/2017 CLINICAL DATA:  Code stroke. 46 year old female with right side weakness and aphasia. Last seen normal at 0 800 hours. EXAM: CT HEAD WITHOUT CONTRAST TECHNIQUE: Contiguous axial images were obtained from the base of the skull through the vertex without intravenous contrast. COMPARISON:  Brain MRI 03/17/2017.  Head CT 03/16/2017 FINDINGS: Brain: Cerebral volume is normal. No midline shift, ventriculomegaly, mass  effect, evidence of mass lesion, intracranial hemorrhage or evidence of cortically based acute infarction. Gray-white matter differentiation is within normal limits throughout the brain. Vascular: No suspicious intracranial vascular hyperdensity. Skull: No acute osseous abnormality identified. Sinuses/Orbits: Visualized paranasal sinuses and mastoids are stable and well pneumatized. Other: Negative orbit and scalp soft tissues. ASPECTS ([place] Stroke Program Early CT Score) - Ganglionic level infarction (caudate, lentiform nuclei, internal capsule, insula, M1-M3 cortex): 7 - Supraganglionic infarction (M4-M6 cortex): 3 Total score (0-10 with 10 being normal): 10 IMPRESSION: 1. Stable and normal noncontrast CT appearance of the brain. 2. ASPECTS is 10. 3. The above was relayed via text pager to Dr. Bruce Donath on 05/02/2017 at 13:55 . Electronically Signed   By: Genevie Ann M.D.   On: 05/02/2017 13:55    Procedures Procedures (including critical care time)  Medications Ordered in ED Medications  LORazepam (ATIVAN) injection 1 mg (1 mg Intravenous Given 05/02/17 1509)     Initial Impression / Assessment and Plan / ED Course  I have reviewed  the triage vital signs and the nursing notes.  Pertinent labs & imaging results that were available during my care of the patient were reviewed by me and considered in my medical decision making (see chart for details).  Patient is a 46 year old female presenting with oddconstellation of neuro symptoms. Patient had similar constellation at [facility] 6 weeks ago. MRI was negative and she was discharged home. She presents with similar non-neurologically explained symtpoms today. initailly code stroke. Seen by neurology. They would like MRI and discharge home if normal.   4:35 PM Unsure what is causing patient's symptoms. MRI is normal. per neurology, will discharge home. Patient still having occasional ringing in her right ear. She is also still reports a 1 cm by 1 cm area of numbness in her right cheek. And she reports weakness on the right side her face although on physical exam appears to be a left-sided face. We will have her follow-up with her neurologist.   Final Clinical Impressions(s) / ED Diagnoses   Final diagnoses:  Weakness  Facial droop    New Prescriptions New Prescriptions   No medications on file     Macarthur Critchley, MD 05/02/17 1635

## 2017-05-02 NOTE — Discharge Instructions (Signed)
We are unsure what is causing your symptoms.  Please return as needed to the ED with worsening and follow up ASAP with your neurologist.

## 2017-05-02 NOTE — ED Triage Notes (Signed)
Patient sent by private vehicle from doctor's office for stroke symptoms, increased right sided facial droop, right sided weakness, slurred speech.  Patient alert and oriented and in no apparent distress at this time.

## 2017-05-02 NOTE — Consult Note (Signed)
Requesting Physician: Dr. Thomasene Lot    Chief Complaint: Code Stroke  History obtained from:  Patient     HPI:                                                                                                                                         [name] [name] is an 46 y.o. female is a 46 year old female who is arriving to [facility] at 1330 by private vehicle. Patient reports she is at work at 0800 hrs. and noticed some dizziness/vertigo. At that time her blood pressure was elevated. She went to her primary care office and was sent to the ED secondary to facial droop, decreased right facial sensation and arm sensation along with dizziness and nausea. Patient reports that she did have a Bell's palsy 1 month ago with residual left facial droop. Upon arrival patient went to CT scanner which showed no acute stroke. NIHSS was 2. Patient continued to complain of right facial decreased sensation but only over the right aspect of her lateral portion of her nose and cheek she also had some decreased sensation that was subjective over her right arm and leg. This was only to light touch but not cold temperature. She was not a TPA candidate secondary to minimal symptoms. Patient denied any blurred vision, double vision, nausea, vomiting, weakness, decreased hearing, syncope, seizure, or upset stomach.  Date last known well: Date: 05/02/2017 Time last known well: Time: 08:30 tPA Given: No: minimal symptoms --sensory in face and left facial droop (old)   Modified Rankin: Rankin Score=0    Past Medical History:  Diagnosis Date  . Breast cancer, left breast ([place]) 2000   Underwent lumpectomy, chemotherapy and radiation  . Facial paralysis/Bells palsy    left side  . Hypertension   . Renal disorder   . TIA (transient ischemic attack) 03/16/2017   [facility]    Past Surgical History:  Procedure Laterality Date  . BREAST LUMPECTOMY WITH AXILLARY LYMPH NODE BIOPSY Left 2000   biopsy  . TUBAL  LIGATION Bilateral 2004    Family History  Problem Relation Age of Onset  . Hypertension Mother   . Hypertension Father   . Diverticulosis Father   . Diabetes Father   . Hypertension Maternal Grandmother   . CAD Maternal Grandmother   . Hypertension Paternal Grandmother   . CAD Paternal Grandmother   . Stroke Paternal Grandfather    Social History:  reports that she has never smoked. She has never used smokeless tobacco. She reports that she does not drink alcohol or use drugs.  Allergies:  Allergies  Allergen Reactions  . Ace Inhibitors Swelling  . Latex     Severe itching and rash  . Lisinopril Swelling    Swelling of lips, face    Medications:  No current facility-administered medications for this encounter.    Current Outpatient Prescriptions  Medication Sig Dispense Refill  . aspirin EC 81 MG EC tablet Take 1 tablet (81 mg total) by mouth daily. 30 tablet 0  . cloNIDine (CATAPRES) 0.2 MG tablet Take 0.2 mg by mouth 3 (three) times daily as needed (high blood pressure).     . ferrous sulfate 325 (65 FE) MG tablet Take 1 tablet (325 mg total) by mouth daily. 30 tablet 0  . ibuprofen (ADVIL,MOTRIN) 200 MG tablet Take 400 mg by mouth every 6 (six) hours as needed. For pain.     . Multiple Vitamin (MULITIVITAMIN WITH MINERALS) TABS Take 1 tablet by mouth daily.      . Nebivolol HCl 20 MG TABS Take 1 tablet (20 mg total) by mouth daily. 30 tablet 6  . spironolactone (ALDACTONE) 50 MG tablet Take 1 tablet (50 mg total) by mouth daily. 30 tablet 6     ROS:                                                                                                                                       History obtained from the patient  General ROS: negative for - chills, fatigue, fever, night sweats, weight gain or weight loss Psychological ROS: negative for -  behavioral disorder, hallucinations, memory difficulties, mood swings or suicidal ideation Ophthalmic ROS: negative for - blurry vision, double vision, eye pain or loss of vision ENT ROS: positive for -  vertigo Allergy and Immunology ROS: negative for - hives or itchy/watery eyes Hematological and Lymphatic ROS: negative for - bleeding problems, bruising or swollen lymph nodes Endocrine ROS: negative for - galactorrhea, hair pattern changes, polydipsia/polyuria or temperature intolerance Respiratory ROS: negative for - cough, hemoptysis, shortness of breath or wheezing Cardiovascular ROS: negative for - chest pain, dyspnea on exertion, edema or irregular heartbeat Gastrointestinal ROS: negative for - abdominal pain, diarrhea, hematemesis, nausea/vomiting or stool incontinence Genito-Urinary ROS: negative for - dysuria, hematuria, incontinence or urinary frequency/urgency Musculoskeletal ROS: negative for - joint swelling or muscular weakness Neurological ROS: as noted in HPI Dermatological ROS: negative for rash and skin lesion changes  General Examination:                                                                                                      Blood pressure (!) 192/95, pulse (!) 57, SpO2 100 %.  HEENT-  Normocephalic, no lesions, without obvious abnormality.  Normal external eye  and conjunctiva.  Normal TM's bilaterally.  Normal auditory canals and external ears. Normal external nose, mucus membranes and septum.  Normal pharynx. Cardiovascular- S1, S2 normal, pulses palpable throughout   Lungs- chest clear, no wheezing, rales, normal symmetric air entry Abdomen- normal findings: bowel sounds normal Extremities- no edema Lymph-no adenopathy palpable Musculoskeletal-no joint tenderness, deformity or swelling Skin-warm and dry, no hyperpigmentation, vitiligo, or suspicious lesions  Neurological Examination Mental Status: Alert, oriented, thought content appropriate.  Speech  fluent without evidence of aphasia.  Able to follow 3 step commands without difficulty. Cranial Nerves: II:  Visual fields grossly normal,  III,IV, VI: ptosis not present, extra-ocular motions intact bilaterally, pupils equal, round, reactive to light and accommodation V,VII: smile symmetric--however at rest she had a left facial droop and should be noted that this left facial droop would at times be very no swollen other times be minimally noticeable. When asked to hold air within her cheeks patient did not have any air leaks but did tend to hold the right portion of her cheek very close to her teeth. As far as facial sensation patient stated that she did have decreased sensation as noted abovey VIII: hearing normal bilaterally IX,X: uvula rises symmetrically XI: bilateral shoulder shrug XII: midline tongue extension Motor: Right : Upper extremity   5/5    Left:     Upper extremity   5/5  Lower extremity   5/5     Lower extremity   5/5 Tone and bulk:normal tone throughout; no atrophy noted Sensory: Decreased light touch on the right arm and leg; however, no change in temperature on either arm or leg. Deep Tendon Reflexes: Hyporeflexic throughout Plantars: Right: downgoing   Left: downgoing Cerebellar: normal finger-to-nose and normal heel-to-shin test Gait: Deferred  Lab Results: Basic Metabolic Panel:  Recent Labs Lab 05/02/17 1346  NA 139  K 4.5  CL 109  GLUCOSE 85  BUN 25*  CREATININE 1.20*    Liver Function Tests: No results for input(s): AST, ALT, ALKPHOS, BILITOT, PROT, ALBUMIN in the last 168 hours. No results for input(s): LIPASE, AMYLASE in the last 168 hours. No results for input(s): AMMONIA in the last 168 hours.  CBC:  Recent Labs Lab 05/02/17 1340 05/02/17 1346  WBC 6.3  --   NEUTROABS 3.5  --   HGB 10.3* 11.6*  HCT 32.4* 34.0*  MCV 79.6  --   PLT 206  --     Cardiac Enzymes: No results for input(s): CKTOTAL, CKMB, CKMBINDEX, TROPONINI in the last  168 hours.  Lipid Panel: No results for input(s): CHOL, TRIG, HDL, CHOLHDL, VLDL, LDLCALC in the last 168 hours.  CBG:  Recent Labs Lab 05/02/17 1357  GLUCAP 75    Microbiology: No results found for this or any previous visit.  Coagulation Studies:  Recent Labs  05/02/17 1340  LABPROT 14.1  INR 1.09    Imaging: Ct Head Code Stroke Wo Contrast`  Result Date: 05/02/2017 CLINICAL DATA:  Code stroke. 46 year old female with right side weakness and aphasia. Last seen normal at 0 800 hours. EXAM: CT HEAD WITHOUT CONTRAST TECHNIQUE: Contiguous axial images were obtained from the base of the skull through the vertex without intravenous contrast. COMPARISON:  Brain MRI 03/17/2017.  Head CT 03/16/2017 FINDINGS: Brain: Cerebral volume is normal. No midline shift, ventriculomegaly, mass effect, evidence of mass lesion, intracranial hemorrhage or evidence of cortically based acute infarction. Gray-white matter differentiation is within normal limits throughout the brain. Vascular: No suspicious intracranial vascular hyperdensity. Skull:  No acute osseous abnormality identified. Sinuses/Orbits: Visualized paranasal sinuses and mastoids are stable and well pneumatized. Other: Negative orbit and scalp soft tissues. ASPECTS ([place] Stroke Program Early CT Score) - Ganglionic level infarction (caudate, lentiform nuclei, internal capsule, insula, M1-M3 cortex): 7 - Supraganglionic infarction (M4-M6 cortex): 3 Total score (0-10 with 10 being normal): 10 IMPRESSION: 1. Stable and normal noncontrast CT appearance of the brain. 2. ASPECTS is 10. 3. The above was relayed via text pager to Dr. Bruce Donath on 05/02/2017 at 13:55 . Electronically Signed   By: Genevie Ann M.D.   On: 05/02/2017 13:55   History and examination documented by Etta Quill PA-C, [facility], [phone] 05/02/2017, 2:13 PM   Assessment: 46 y.o. female presenting to the emergency department with vertigo, decreased sensation on the  right arm and leg, and decreased sensation within the malar region on the right. Exam was inconsistent at times with no focal motor or cerebellar findings, as well as no visual field cut or speech deficit. Given her risk factor of hypertension and that she had elevated blood pressure of 192/95, the most likely etiology for her presentation is hypertensive urgency.  Recommendations: 1) MRI brain without contrast. If negative, no further stroke workup recommended and patient should follow-up with outpatient neurology for further evaluation. 2) If positive for acute infarct would then need to be admitted for inpatient workup for stroke evaluation. This will be done by the hospitalist or medicine teachig service, with stroke team following in the morning 3) BP management. Not a good permissive HTN candidate given hypertensive urgency as the most likely etiology for her presentation.    Electronically signed: Dr. Kerney Elbe

## 2017-05-02 NOTE — Code Documentation (Signed)
46yo female arriving to [facility] at 1330 via private vehicle.  Patient reports arriving to work at 0800 and noticing dizziness.  Of note, her BP was elevated this morning.  She went to her PCP office and was sent to the ED.  Patient reporting facial droop, decreased right facial and arm sensation, right sided weakness, dizziness and nausea.  Of note, patient reports h/o Bell's palsy 1 month ago with residual facial droop.  Code stroke called on patient arrival.  Patient to CT.  Stroke team to the bedside.  NIHSS 2, see documentation for details and code stroke times.  Patient with left facial droop and subjective decreased sensation in the right face and arm on exam.  Patient is outside the window for treatment with tPA.  No acute stroke treatment at this time.  Code stroke canceled.  Bedside handoff with ED RN Carlis Abbott.

## 2017-05-02 NOTE — ED Notes (Signed)
Pt returned from MRI °

## 2017-07-29 ENCOUNTER — Other Ambulatory Visit: Payer: Self-pay | Admitting: Nephrology

## 2017-07-29 DIAGNOSIS — I129 Hypertensive chronic kidney disease with stage 1 through stage 4 chronic kidney disease, or unspecified chronic kidney disease: Secondary | ICD-10-CM | POA: Diagnosis not present

## 2017-07-29 DIAGNOSIS — N183 Chronic kidney disease, stage 3 (moderate): Secondary | ICD-10-CM | POA: Diagnosis not present

## 2017-07-29 DIAGNOSIS — D509 Iron deficiency anemia, unspecified: Secondary | ICD-10-CM | POA: Diagnosis not present

## 2017-08-01 ENCOUNTER — Other Ambulatory Visit: Payer: 59

## 2017-08-06 ENCOUNTER — Ambulatory Visit: Payer: 59 | Admitting: Diagnostic Neuroimaging

## 2017-08-08 ENCOUNTER — Encounter: Payer: Self-pay | Admitting: Diagnostic Neuroimaging

## 2017-10-13 ENCOUNTER — Other Ambulatory Visit: Payer: Self-pay | Admitting: Cardiology

## 2017-10-14 DIAGNOSIS — I10 Essential (primary) hypertension: Secondary | ICD-10-CM | POA: Diagnosis not present

## 2017-10-14 DIAGNOSIS — Z862 Personal history of diseases of the blood and blood-forming organs and certain disorders involving the immune mechanism: Secondary | ICD-10-CM | POA: Diagnosis not present

## 2017-10-14 DIAGNOSIS — N183 Chronic kidney disease, stage 3 (moderate): Secondary | ICD-10-CM | POA: Diagnosis not present

## 2017-12-02 DIAGNOSIS — C50911 Malignant neoplasm of unspecified site of right female breast: Secondary | ICD-10-CM

## 2017-12-02 HISTORY — DX: Malignant neoplasm of unspecified site of right female breast: C50.911

## 2017-12-02 HISTORY — PX: MASTECTOMY: SHX3

## 2018-01-28 DIAGNOSIS — J101 Influenza due to other identified influenza virus with other respiratory manifestations: Secondary | ICD-10-CM | POA: Diagnosis not present

## 2018-01-28 DIAGNOSIS — R6883 Chills (without fever): Secondary | ICD-10-CM | POA: Diagnosis not present

## 2018-01-28 DIAGNOSIS — R05 Cough: Secondary | ICD-10-CM | POA: Diagnosis not present

## 2018-03-05 DIAGNOSIS — Z853 Personal history of malignant neoplasm of breast: Secondary | ICD-10-CM | POA: Diagnosis not present

## 2018-03-05 DIAGNOSIS — N6310 Unspecified lump in the right breast, unspecified quadrant: Secondary | ICD-10-CM | POA: Diagnosis not present

## 2018-03-12 DIAGNOSIS — Z853 Personal history of malignant neoplasm of breast: Secondary | ICD-10-CM | POA: Diagnosis not present

## 2018-03-12 DIAGNOSIS — N6311 Unspecified lump in the right breast, upper outer quadrant: Secondary | ICD-10-CM | POA: Diagnosis not present

## 2018-03-16 ENCOUNTER — Other Ambulatory Visit: Payer: Self-pay | Admitting: Radiology

## 2018-03-16 DIAGNOSIS — C773 Secondary and unspecified malignant neoplasm of axilla and upper limb lymph nodes: Secondary | ICD-10-CM | POA: Diagnosis not present

## 2018-03-16 DIAGNOSIS — D0591 Unspecified type of carcinoma in situ of right breast: Secondary | ICD-10-CM | POA: Diagnosis not present

## 2018-03-16 DIAGNOSIS — C50811 Malignant neoplasm of overlapping sites of right female breast: Secondary | ICD-10-CM | POA: Diagnosis not present

## 2018-03-16 DIAGNOSIS — C50411 Malignant neoplasm of upper-outer quadrant of right female breast: Secondary | ICD-10-CM | POA: Diagnosis not present

## 2018-03-18 ENCOUNTER — Telehealth: Payer: Self-pay | Admitting: Oncology

## 2018-03-18 NOTE — Telephone Encounter (Signed)
Spoke with patient to confirm morning [facility] appointment for 03/25/18, [facility] patient no packet sent, but an appointment reminder will be mailed

## 2018-03-19 ENCOUNTER — Encounter: Payer: Self-pay | Admitting: *Deleted

## 2018-03-19 DIAGNOSIS — Z170 Estrogen receptor positive status [ER+]: Secondary | ICD-10-CM

## 2018-03-19 DIAGNOSIS — C50411 Malignant neoplasm of upper-outer quadrant of right female breast: Secondary | ICD-10-CM | POA: Insufficient documentation

## 2018-03-23 NOTE — Progress Notes (Signed)
[place]  [phone] Fax:([phone]) [phone]     ID: [name] [name] DOB: [birth year]  MR#: [id number]  BLT#:[id number]  Patient Care Team: Marda Stalker, PA-C as PCP - General (Family Medicine) Emilina Smarr, Virgie Dad, MD as Consulting Physician (Oncology) Loreta Ave, MD as Referring Physician (Internal Medicine) Donato Heinz, MD as Consulting Physician (Nephrology) OTHER MD:  CHIEF COMPLAINT: Triple positive breast cancer  CURRENT TREATMENT: Neoadjuvant chemotherapy   HISTORY OF CURRENT ILLNESS: [name] [name] palpated a lump on 01/15/2018. She felt soreness and shooting pain under her arm and in the right breast. She underwent bilateral diagnostic mammography with tomography and right breast ultrasonography at [facility] on 03/12/2018 showing: breast density category C. The is architectural distortion at the 11 o'clock position. There is also an oval mass in the right breast anterior depth. Examination of the right axilla showed enlarged lymph nodes. Ultrasonography showed a 4.6 cm mass in the right breast upper outer quadrant posterior depth. An additional 1.2 cm oval mass in the right breast 12 o'clock middle depth. The lymph nodes in the right axillary are highly suggestive of malignancy.   Accordingly on 03/16/2018 she proceeded to biopsy of the right breast area in question. The pathology from this procedure showed ([id number]): At both the 11 and 12 o'clock positions: Invasive ductal carcinoma grade II. Ductal carcinoma in situ, high grade, with necrosis and calcifications. Prognostic indicators significant for: estrogen receptor, 90% positive and progesterone receptor, 40% positive, both with strong staining intensity. Proliferation marker Ki67 at 80%. HER2 amplified with ratios HER2/CEP17 SIGNALS 6.90 and average HER2 copies per cell 14.50  The patient's subsequent history is as detailed below.  INTERVAL HISTORY: [name] was evaluated in the  multidisciplinary breast cancer clinic on 03/25/2018 accompanied by her husband [name]. Her case was also presented at the multidisciplinary breast cancer conference on the same day. At that time a preliminary plan was proposed: Neoadjuvant chemotherapy, likely followed by modified radical mastectomy.  Genetics testing.  Postmastectomy radiation.  Antihistamines   REVIEW OF SYSTEMS: Aside from the mass itself, there were no specific symptoms leading to the diagnostic mammogram,  The patient denies unusual headaches, visual changes, nausea, vomiting, stiff neck, dizziness, or gait imbalance. There has been no cough, phlegm production, or pleurisy, no chest pain or pressure, and no change in bowel or bladder habits. The patient denies fever, rash, bleeding, unexplained fatigue or unexplained weight loss. A detailed review of systems was otherwise entirely negative.   PAST MEDICAL HISTORY: Past Medical History:  Diagnosis Date  . Breast cancer, left breast ([place]) 2000   Underwent lumpectomy, chemotherapy and radiation  . Facial paralysis/Bells palsy    left side  . Hypertension   . Renal disorder   . TIA (transient ischemic attack) 03/16/2017   [facility]   The patient has a previous history of left breast cancer diagnosed in 2000. She was seen by [facility] in [place], [place] under Dr. Loreta Ave. According to the patient, her left breast cancer was stage II with no lymph node involvement (likely T2N0). She had chemotherapy every 3 weeks for about 6 months. She did not takes any "red drugs." She also did not take anti-estrogens (likely ER/PR negative).   TIA in 2018. She saw a neurologist as a precaution. She denies any clotting issues.   PAST SURGICAL HISTORY: Past Surgical History:  Procedure Laterality Date  . BREAST LUMPECTOMY WITH AXILLARY LYMPH NODE BIOPSY Left 2000   biopsy  . TUBAL LIGATION Bilateral 2004  FAMILY HISTORY Family History  Problem Relation  Age of Onset  . Hypertension Mother   . Hypertension Father   . Diverticulosis Father   . Diabetes Father   . Lung cancer Father   . Hypertension Maternal Grandmother   . CAD Maternal Grandmother   . Hypertension Paternal Grandmother   . CAD Paternal Grandmother   . Stroke Paternal Grandfather   The patient's father is alive at age 33 and had a history of lung cancer. The patient's mother is alive at age 34. The patient has 1 brother and no sisters. She denies a family history of breast or ovarian cancer.    GYNECOLOGIC HISTORY:  No LMP recorded. Menarche: 47 years old Age at first live birth: 47 years old She is GXP3. She is no longer having periods, which were irregular and light. Her LMP was  January 2019 and  December 2018. She is not having hot flashes. The patient used oral contraceptive from [phone] and the Depo-provera shot from [id number] with no complications. She never used HRT.    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 Her husband, [name], is a Administrator. The patient's son, Shea Stakes age 36, works at [employer] in [place]. The patient's daughters, Lilia Pro age 62, and Levonne Spiller age 72, are students. The patient's adopted son, Vonna Kotyk age 5, is also a Ship broker.       ADVANCED DIRECTIVES:    HEALTH MAINTENANCE: Social History   Tobacco Use  . Smoking status: Never Smoker  . Smokeless tobacco: Never Used  Substance Use Topics  . Alcohol use: No  . Drug use: No     Colonoscopy:   PAP: 2015  Bone density:   Allergies  Allergen Reactions  . Ace Inhibitors Swelling    Swelling of lips and face  . Latex Itching and Rash  . Lisinopril Swelling    Swelling of lips, face  . Amlodipine Swelling    Leg swelling  . Adhesive [Tape] Itching and Rash    Please use paper tape    Current Outpatient Medications  Medication Sig Dispense Refill  . acetaminophen (TYLENOL) 500 MG tablet Take 500 mg by mouth every 6 (six) hours as needed for  headache (pain).    Marland Kitchen aspirin EC 81 MG EC tablet Take 1 tablet (81 mg total) by mouth daily. 30 tablet 0  . BYSTOLIC 20 MG TABS TAKE 1 TABLET (20 MG TOTAL) BY MOUTH DAILY. 30 tablet 6  . Multiple Vitamin (MULITIVITAMIN WITH MINERALS) TABS Take 1 tablet by mouth daily.      Marland Kitchen spironolactone (ALDACTONE) 50 MG tablet TAKE 1 TABLET (50 MG TOTAL) BY MOUTH DAILY. 30 tablet 6  . cloNIDine (CATAPRES) 0.2 MG tablet Take 0.2 mg by mouth at bedtime.     . ferrous sulfate 325 (65 FE) MG tablet Take 1 tablet (325 mg total) by mouth daily. 30 tablet 0   No current facility-administered medications for this visit.     OBJECTIVE: Aged African-American woman who appears well  Vitals:   03/25/18 0836  BP: 134/83  Pulse: (!) 58  Resp: 17  Temp: 98 F (36.7 C)  SpO2: 100%     Body mass index is 43.18 kg/m.   Wt Readings from Last 3 Encounters:  03/25/18 213 lb 12.8 oz (97 kg)  04/21/17 202 lb 9.6 oz (91.9 kg)  04/17/17 202 lb 9.6 oz (91.9 kg)      ECOG FS:0 - Asymptomatic  Ocular: Sclerae unicteric,  pupils round and equal Ear-nose-throat: Oropharynx clear and moist Lymphatic: No cervical or supraclavicular adenopathy Lungs no rales or rhonchi Heart regular rate and rhythm Abd soft, nontender, positive bowel sounds MSK no focal spinal tenderness, no joint edema Neuro: non-focal, well-oriented, appropriate affect Breasts: There is a mass in the right breast taking up most of the upper outer quadrant.  There is mild discoloration but no erythema.  The left breast is benign.  I do not palpate obvious adenopathy in the right axilla.   LAB RESULTS:  CMP     Component Value Date/Time   NA 139 05/02/2017 1346   K 4.5 05/02/2017 1346   CL 109 05/02/2017 1346   CO2 19 (L) 05/02/2017 1340   GLUCOSE 85 05/02/2017 1346   BUN 25 (H) 05/02/2017 1346   CREATININE 1.20 (H) 05/02/2017 1346   CALCIUM 10.3 05/02/2017 1340   PROT 7.1 05/02/2017 1340   ALBUMIN 3.6 05/02/2017 1340   AST 20 05/02/2017  1340   ALT 22 05/02/2017 1340   ALKPHOS 69 05/02/2017 1340   BILITOT 0.6 05/02/2017 1340   GFRNONAA 60 (L) 05/02/2017 1340   GFRAA >60 05/02/2017 1340    No results found for: TOTALPROTELP, ALBUMINELP, A1GS, A2GS, BETS, BETA2SER, GAMS, MSPIKE, SPEI  No results found for: KPAFRELGTCHN, LAMBDASER, KAPLAMBRATIO  Lab Results  Component Value Date   WBC 6.3 05/02/2017   NEUTROABS 3.5 05/02/2017   HGB 11.6 (L) 05/02/2017   HCT 34.0 (L) 05/02/2017   MCV 79.6 05/02/2017   PLT 206 05/02/2017    '@LASTCHEMISTRY'$ @  No results found for: LABCA2  No components found for: HENIDP824  No results for input(s): INR in the last 168 hours.  No results found for: LABCA2  No results found for: MPN361  No results found for: WER154  No results found for: MGQ676  No results found for: CA2729  No components found for: HGQUANT  No results found for: CEA1 / No results found for: CEA1   No results found for: AFPTUMOR  No results found for: CHROMOGRNA  No results found for: PSA1  No visits with results within 3 Day(s) from this visit.  Latest known visit with results is:  Admission on 05/02/2017, Discharged on 05/02/2017  Component Date Value Ref Range Status  . Sodium 05/02/2017 139  135 - 145 mmol/L Final  . Potassium 05/02/2017 4.5  3.5 - 5.1 mmol/L Final  . Chloride 05/02/2017 109  101 - 111 mmol/L Final  . BUN 05/02/2017 25* 6 - 20 mg/dL Final  . Creatinine, Ser 05/02/2017 1.20* 0.44 - 1.00 mg/dL Final  . Glucose, Bld 05/02/2017 85  65 - 99 mg/dL Final  . Calcium, Ion 05/02/2017 1.37  1.15 - 1.40 mmol/L Final  . TCO2 05/02/2017 22  0 - 100 mmol/L Final  . Hemoglobin 05/02/2017 11.6* 12.0 - 15.0 g/dL Final  . HCT 05/02/2017 34.0* 36.0 - 46.0 % Final  . Alcohol, Ethyl (B) 05/02/2017 <5  <5 mg/dL Final   Comment:        LOWEST DETECTABLE LIMIT FOR SERUM ALCOHOL IS 5 mg/dL FOR MEDICAL PURPOSES ONLY   . Prothrombin Time 05/02/2017 14.1  11.4 - 15.2 seconds Final  . INR  05/02/2017 1.09   Final  . aPTT 05/02/2017 32  24 - 36 seconds Final  . WBC 05/02/2017 6.3  4.0 - 10.5 K/uL Final  . RBC 05/02/2017 4.07  3.87 - 5.11 MIL/uL Final  . Hemoglobin 05/02/2017 10.3* 12.0 - 15.0 g/dL Final  . HCT  05/02/2017 32.4* 36.0 - 46.0 % Final  . MCV 05/02/2017 79.6  78.0 - 100.0 fL Final  . MCH 05/02/2017 25.3* 26.0 - 34.0 pg Final  . MCHC 05/02/2017 31.8  30.0 - 36.0 g/dL Final  . RDW 05/02/2017 19.1* 11.5 - 15.5 % Final  . Platelets 05/02/2017 206  150 - 400 K/uL Final  . Neutrophils Relative % 05/02/2017 56  % Final  . Neutro Abs 05/02/2017 3.5  1.7 - 7.7 K/uL Final  . Lymphocytes Relative 05/02/2017 34  % Final  . Lymphs Abs 05/02/2017 2.1  0.7 - 4.0 K/uL Final  . Monocytes Relative 05/02/2017 8  % Final  . Monocytes Absolute 05/02/2017 0.5  0.1 - 1.0 K/uL Final  . Eosinophils Relative 05/02/2017 2  % Final  . Eosinophils Absolute 05/02/2017 0.1  0.0 - 0.7 K/uL Final  . Basophils Relative 05/02/2017 0  % Final  . Basophils Absolute 05/02/2017 0.0  0.0 - 0.1 K/uL Final  . Sodium 05/02/2017 137  135 - 145 mmol/L Final  . Potassium 05/02/2017 4.3  3.5 - 5.1 mmol/L Final  . Chloride 05/02/2017 110  101 - 111 mmol/L Final  . CO2 05/02/2017 19* 22 - 32 mmol/L Final  . Glucose, Bld 05/02/2017 86  65 - 99 mg/dL Final  . BUN 05/02/2017 19  6 - 20 mg/dL Final  . Creatinine, Ser 05/02/2017 1.10* 0.44 - 1.00 mg/dL Final  . Calcium 05/02/2017 10.3  8.9 - 10.3 mg/dL Final  . Total Protein 05/02/2017 7.1  6.5 - 8.1 g/dL Final  . Albumin 05/02/2017 3.6  3.5 - 5.0 g/dL Final  . AST 05/02/2017 20  15 - 41 U/L Final  . ALT 05/02/2017 22  14 - 54 U/L Final  . Alkaline Phosphatase 05/02/2017 69  38 - 126 U/L Final  . Total Bilirubin 05/02/2017 0.6  0.3 - 1.2 mg/dL Final  . GFR calc non Af Amer 05/02/2017 60* >60 mL/min Final  . GFR calc Af Amer 05/02/2017 >60  >60 mL/min Final   Comment: (NOTE) The eGFR has been calculated using the CKD EPI equation. This calculation has not  been validated in all clinical situations. eGFR's persistently <60 mL/min signify possible Chronic Kidney Disease.   . Anion gap 05/02/2017 8  5 - 15 Final  . Troponin i, poc 05/02/2017 0.00  0.00 - 0.08 ng/mL Final  . Comment 3 05/02/2017          Final   Comment: Due to the release kinetics of cTnI, a negative result within the first hours of the onset of symptoms does not rule out myocardial infarction with certainty. If myocardial infarction is still suspected, repeat the test at appropriate intervals.   . Glucose-Capillary 05/02/2017 75  65 - 99 mg/dL Final  . TSH 05/02/2017 2.286  0.350 - 4.500 uIU/mL Final   Performed by a 3rd Generation assay with a functional sensitivity of <=0.01 uIU/mL.    (this displays the last labs from the last 3 days)  No results found for: TOTALPROTELP, ALBUMINELP, A1GS, A2GS, BETS, BETA2SER, GAMS, MSPIKE, SPEI (this displays SPEP labs)  No results found for: KPAFRELGTCHN, LAMBDASER, KAPLAMBRATIO (kappa/lambda light chains)  No results found for: HGBA, HGBA2QUANT, HGBFQUANT, HGBSQUAN (Hemoglobinopathy evaluation)   No results found for: LDH  No results found for: IRON, TIBC, IRONPCTSAT (Iron and TIBC)  No results found for: FERRITIN  Urinalysis    Component Value Date/Time   COLORURINE STRAW (A) 03/17/2017 0600   APPEARANCEUR CLEAR (A) 03/17/2017 0600  LABSPEC 1.031 (H) 03/17/2017 0600   PHURINE 5.0 03/17/2017 0600   GLUCOSEU NEGATIVE 03/17/2017 0600   HGBUR NEGATIVE 03/17/2017 0600   BILIRUBINUR NEGATIVE 03/17/2017 0600   KETONESUR NEGATIVE 03/17/2017 0600   PROTEINUR NEGATIVE 03/17/2017 0600   UROBILINOGEN 0.2 11/29/2011 1656   NITRITE NEGATIVE 03/17/2017 0600   LEUKOCYTESUR NEGATIVE 03/17/2017 0600     STUDIES: Outside studies reviewed with the patient  ELIGIBLE FOR AVAILABLE RESEARCH PROTOCOL: no  ASSESSMENT: 47 y.o. [place], [place] woman  (1) status post left lumpectomy in 2000 for a (?) T2N0 breast cancer,  (a) status  post adjuvant chemotherapy  (b) status post adjuvant radiation  (c) did not receive antiestrogens  (2) genetics testing pending  (3) status post right breast biopsy 03/16/2018 for a clinical mT2-3 N2, anatomic stage III invasive ductal carcinoma, grade 2, triple positive, with an MIB-1 of 80%.  (4) neoadjuvant chemotherapy will consist of carboplatin, docetaxel, trastuzumab and Pertuzumab every 3 weeks x 6  (5) trastuzumab and Pertuzumab to be continued to a total of 6 months  (6) definitive surgery to follow  (7) adjuvant radiation to the right breast  (8) antiestrogens to start at the completion of local treatment  PLAN: We spent the better part of today's hour-long appointment discussing the biology of her diagnosis and the specifics of her situation. We first reviewed the fact that cancer is not one disease but more than 100 different diseases and that it is important to keep them separate-- otherwise when friends and relatives discuss their own cancer experiences with [name] confusion can result. Similarly we explained that if breast cancer spreads to the bone or liver, the patient would not have bone cancer or liver cancer, but breast cancer in the bone and breast cancer in the liver: one cancer in three places-- not 3 different cancers which otherwise would have to be treated in 3 different ways.  We discussed the difference between local and systemic therapy. In terms of loco-regional treatment, lumpectomy plus radiation is equivalent to mastectomy as far as survival is concerned.  However in this case it may be very difficult to avoid modified radical mastectomy given the initial presentation.  She will also need postmastectomy radiation.   We also noted that in terms of sequencing of treatments, whether systemic therapy or surgery is done first does not affect the ultimate outcome.  This is the optimal way of treating this patient as it will make the surgery easier, possibly even  allow her to keep her breast if her response is extraordinarily good, and the response to chemotherapy will also give [place] added prognostic information  We then discussed the rationale for systemic therapy. There is some risk that this cancer may have already spread to other parts of her body.  We will obtain staging studies given the fact that we are dealing with stage III anatomic disease, but she understands microscopic spread of disease is not demonstrable with any blood work or currently available radiographic study.  The studies however should show that she does not currently have stage IV disease  Next we went over the options for systemic therapy which are anti-estrogens, anti-HER-2 immunotherapy, and chemotherapy. [name] qualifies for all 3 and this is very favorable as it means her risk of recurrence, what ever it is (call it X) will be cut down to 1/6 of X with chemotherapy, antiestrogens, and anti-HER-2 treatment  More specifically she will start with carboplatin, docetaxel, trastuzumab and Pertuzumab given every 21 days x 6.  The trastuzumab and Pertuzumab will be continued for a total of 6 months.  She will then undergo definitive surgery and adjuvant radiation.  At the completion of this she we will start antihistamines.  She will need a port, an echocardiogram, and meeting with our chemotherapy teaching nurse before proceeding.  I will see her a few days before her target start date, 04/16/2018, to discuss how to take her antiestrogens.  [name] has a good understanding of the overall plan. She agrees with it. She knows the goal of treatment in her case is cure. She will call with any problems that may develop before her next visit here.   Klover Priestly, Virgie Dad, MD  03/25/18 10:37 AM Medical Oncology and Hematology [facility] [address] [place], [place] [address] Tel. [phone]    Fax. [phone]  This document serves as a record of services personally performed  by Lurline Del, MD. It was created on his behalf by Sheron Nightingale, a trained medical scribe. The creation of this record is based on the scribe's personal observations and the provider's statements to them.   I have reviewed the above documentation for accuracy and completeness, and I agree with the above.

## 2018-03-24 ENCOUNTER — Other Ambulatory Visit: Payer: Self-pay

## 2018-03-24 DIAGNOSIS — C50411 Malignant neoplasm of upper-outer quadrant of right female breast: Secondary | ICD-10-CM

## 2018-03-24 DIAGNOSIS — Z170 Estrogen receptor positive status [ER+]: Secondary | ICD-10-CM

## 2018-03-25 ENCOUNTER — Inpatient Hospital Stay: Payer: 59

## 2018-03-25 ENCOUNTER — Ambulatory Visit
Admission: RE | Admit: 2018-03-25 | Discharge: 2018-03-25 | Disposition: A | Discharge status: Home / Self Care | Payer: 59 | Source: Ambulatory Visit | Attending: Radiation Oncology | Admitting: Radiation Oncology

## 2018-03-25 ENCOUNTER — Inpatient Hospital Stay: Payer: 59 | Attending: Oncology | Admitting: Oncology

## 2018-03-25 ENCOUNTER — Telehealth: Payer: Self-pay | Admitting: Oncology

## 2018-03-25 ENCOUNTER — Encounter: Payer: Self-pay | Admitting: Oncology

## 2018-03-25 ENCOUNTER — Ambulatory Visit: Payer: Self-pay | Admitting: General Surgery

## 2018-03-25 ENCOUNTER — Encounter: Payer: Self-pay | Admitting: General Practice

## 2018-03-25 VITALS — BP 134/83 | HR 58 | Temp 98.0°F | Resp 17 | Ht 59.0 in | Wt 213.8 lb

## 2018-03-25 DIAGNOSIS — I10 Essential (primary) hypertension: Secondary | ICD-10-CM | POA: Insufficient documentation

## 2018-03-25 DIAGNOSIS — C50411 Malignant neoplasm of upper-outer quadrant of right female breast: Secondary | ICD-10-CM

## 2018-03-25 DIAGNOSIS — C50811 Malignant neoplasm of overlapping sites of right female breast: Secondary | ICD-10-CM | POA: Diagnosis not present

## 2018-03-25 DIAGNOSIS — Z170 Estrogen receptor positive status [ER+]: Secondary | ICD-10-CM

## 2018-03-25 DIAGNOSIS — Z853 Personal history of malignant neoplasm of breast: Secondary | ICD-10-CM | POA: Diagnosis not present

## 2018-03-25 LAB — CMP (CANCER CENTER ONLY)
ALT: 29 U/L (ref 0–55)
AST: 22 U/L (ref 5–34)
Albumin: 3.8 g/dL (ref 3.5–5.0)
Alkaline Phosphatase: 84 U/L (ref 40–150)
Anion gap: 11 (ref 3–11)
BUN: 21 mg/dL (ref 7–26)
CO2: 25 mmol/L (ref 22–29)
Calcium: 11.3 mg/dL — ABNORMAL HIGH (ref 8.4–10.4)
Chloride: 105 mmol/L (ref 98–109)
Creatinine: 1.29 mg/dL — ABNORMAL HIGH (ref 0.60–1.10)
GFR, Est AFR Am: 57 mL/min — ABNORMAL LOW (ref >60)
GFR, Estimated: 49 mL/min — ABNORMAL LOW (ref >60)
Glucose, Bld: 86 mg/dL (ref 70–140)
Potassium: 4.4 mmol/L (ref 3.5–5.1)
Sodium: 141 mmol/L (ref 136–145)
Total Bilirubin: 0.3 mg/dL (ref 0.2–1.2)
Total Protein: 8.1 g/dL (ref 6.4–8.3)

## 2018-03-25 LAB — CBC WITH DIFFERENTIAL (CANCER CENTER ONLY)
Basophils Absolute: 0.1 10*3/uL (ref 0.0–0.1)
Basophils Relative: 1 %
Eosinophils Absolute: 0.1 10*3/uL (ref 0.0–0.5)
Eosinophils Relative: 2 %
HCT: 40.8 % (ref 34.8–46.6)
Hemoglobin: 13.1 g/dL (ref 11.6–15.9)
Lymphocytes Relative: 30 %
Lymphs Abs: 1.8 10*3/uL (ref 0.9–3.3)
MCH: 26.9 pg (ref 25.1–34.0)
MCHC: 32.2 g/dL (ref 31.5–36.0)
MCV: 83.6 fL (ref 79.5–101.0)
Monocytes Absolute: 0.7 10*3/uL (ref 0.1–0.9)
Monocytes Relative: 12 %
Neutro Abs: 3.2 10*3/uL (ref 1.5–6.5)
Neutrophils Relative %: 55 %
Platelet Count: 187 10*3/uL (ref 145–400)
RBC: 4.88 MIL/uL (ref 3.70–5.45)
RDW: 15.6 % — ABNORMAL HIGH (ref 11.2–14.5)
WBC Count: 5.9 10*3/uL (ref 3.9–10.3)

## 2018-03-25 NOTE — Progress Notes (Signed)
START ON PATHWAY REGIMEN - Breast     A cycle is every 21 days:     Pertuzumab      Pertuzumab      Trastuzumab      Trastuzumab      Carboplatin      Docetaxel   **Always confirm dose/schedule in your pharmacy ordering system**    Patient Characteristics: Preoperative or Nonsurgical Candidate (Clinical Staging), Neoadjuvant Therapy followed by Surgery, Invasive Disease, Chemotherapy, HER2 Positive, ER Positive Therapeutic Status: Preoperative or Nonsurgical Candidate (Clinical Staging) AJCC M Category: cM0 AJCC Grade: G2 Breast Surgical Plan: Neoadjuvant Therapy followed by Surgery ER Status: Positive (+) AJCC 8 Stage Grouping: IIA HER2 Status: Positive (+) AJCC T Category: cT2 AJCC N Category: cN2 PR Status: Positive (+) Intent of Therapy: Curative Intent, Discussed with Patient

## 2018-03-25 NOTE — Progress Notes (Signed)
[place] Psychosocial Distress Screening Clinical Social Work  Clinical Social Work was referred by distress screening protocol.  The patient scored a 10 on the Psychosocial Distress Thermometer which Edwyna Shell severe distress. Clinical Social Worker  to assess for distress and other psychosocial needs. CSW and patient discussed common feeling and emotions when being diagnosed with cancer, and the importance of support during treatment. CSW informed patient of the support team and support services at [facility]. CSW provided contact information and encouraged patient to call with any questions or concerns.  CSW met w patient and husband in exam room.  After meeting treatment team, patient rates distress as 3 - 4.  Anxiety reduced through learning treatment plan and meeting team.  CSW encouraged healthy coping strategies to manage anxiety, will refer to [facility] for peer mentor.  Encouraged patient to call as needed.  Husband and adult children will be major supports for patient throughout treatment.    ONCBCN DISTRESS SCREENING 03/25/2018  Screening Type Initial Screening  Distress experienced in past week (1-10) 10  Practical problem type Work/school  Family Problem type Partner;Children;Other (comment)  Emotional problem type Nervousness/Anxiety  Spiritual/Religous concerns type Loss of sense of purpose  Information Concerns Type Lack of info about diagnosis;Lack of info about treatment;Lack of info about complementary therapy choices  Physical Problem type Pain;Sleep/insomnia  Physician notified of physical symptoms Yes  Referral to clinical psychology No  Referral to clinical social work Yes  Referral to dietition No  Referral to financial advocate No  Referral to support programs Yes  Referral to palliative care No    Clinical Social Worker follow up needed: No.  If yes, follow up plan:  Edwyna Shell, LCSW Clinical Social Worker Phone:  [phone]

## 2018-03-25 NOTE — Progress Notes (Signed)
Radiation Oncology         [phone]) [phone] ________________________________  Name: [name] [name]        MRN: [id number]  Date of Service: 03/25/2018 DOB: [birth year]  HU:[id number], Ainsley Spinner, MD     REFERRING PHYSICIAN: Autumn Messing III, MD   DIAGNOSIS: The encounter diagnosis was Malignant neoplasm of upper-outer quadrant of right breast in female, estrogen receptor positive ([place]).   HISTORY OF PRESENT ILLNESS: [name] [name] is a 47 y.o. female seen in the multidisciplinary breast clinic for a new diagnosis of right breast cancer. The patient was noted to have a prior diagnosis of left breast cancer in 2000. She completed lumpectomy with adjuvant chemotherapy and radiation. She has been followed since, and recently had a palpable area of fullness in the right breast in the upper outer quadrant. She underwent diagnostic imaging which revealed a  4.6 cm mass at 10:30 in the right breast, and a 1.2 cm mass at 12:00. She had greater than 6 lymph nodes that appeared abnormal in the right axilla. She underwent biopsy of all three sites on 03/16/18 revealing a grade 2, invasive ductal carcinoma, triple positive, with a Ki 67 of 80%. She comes today to discuss options of treatment to the breast.     PREVIOUS RADIATION THERAPY: Yes   2000: external beam radiotherapy to the left breast with Dr. Lucia Gaskins at [facility] in [place].    PAST MEDICAL HISTORY:  Past Medical History:  Diagnosis Date  . Breast cancer, left breast ([place]) 2000   Underwent lumpectomy, chemotherapy and radiation  . Facial paralysis/Bells palsy    left side  . Hypertension   . Renal disorder   . TIA (transient ischemic attack) 03/16/2017    hospital       PAST SURGICAL HISTORY: Past Surgical History:  Procedure Laterality Date  . BREAST LUMPECTOMY WITH AXILLARY LYMPH NODE BIOPSY Left 2000   biopsy  . TUBAL LIGATION Bilateral 2004     FAMILY HISTORY:  Family History  Problem Relation Age of Onset   . Hypertension Mother   . Hypertension Father   . Diverticulosis Father   . Diabetes Father   . Hypertension Maternal Grandmother   . CAD Maternal Grandmother   . Hypertension Paternal Grandmother   . CAD Paternal Grandmother   . Stroke Paternal Grandfather      SOCIAL HISTORY:  reports that she has never smoked. She has never used smokeless tobacco. She reports that she does not drink alcohol or use drugs. The patient is married and lives in [place]. She works for a [employer].   ALLERGIES: Latex; Ace inhibitors; Amlodipine; Lisinopril; and Adhesive [tape]   MEDICATIONS:  Current Outpatient Medications  Medication Sig Dispense Refill  . acetaminophen (TYLENOL) 500 MG tablet Take 500 mg by mouth every 6 (six) hours as needed for headache (pain).    Marland Kitchen aspirin EC 81 MG EC tablet Take 1 tablet (81 mg total) by mouth daily. 30 tablet 0  . BYSTOLIC 20 MG TABS TAKE 1 TABLET (20 MG TOTAL) BY MOUTH DAILY. 30 tablet 6  . cloNIDine (CATAPRES) 0.2 MG tablet Take 0.2 mg by mouth at bedtime.     . ferrous sulfate 325 (65 FE) MG tablet Take 1 tablet (325 mg total) by mouth daily. 30 tablet 0  . meclizine (ANTIVERT) 12.5 MG tablet Take 1 tablet (12.5 mg total) by mouth 3 (three) times daily as needed for dizziness. 30 tablet 0  .  Multiple Vitamin (MULITIVITAMIN WITH MINERALS) TABS Take 1 tablet by mouth daily.      . Naproxen Sodium (PAMPRIN ALL DAY RELIEF MAX ST PO) Take 1 tablet by mouth daily as needed (cramps).    Marland Kitchen spironolactone (ALDACTONE) 50 MG tablet TAKE 1 TABLET (50 MG TOTAL) BY MOUTH DAILY. 30 tablet 6   No current facility-administered medications for this encounter.      REVIEW OF SYSTEMS: On review of systems, the patient reports that she is doing well overall. She denies any chest pain, shortness of breath, cough, fevers, chills, night sweats, unintended weight changes. She denies any bowel or bladder disturbances, and denies abdominal pain, nausea or  vomiting. She denies any new musculoskeletal or joint aches or pains. A complete review of systems is obtained and is otherwise negative.     PHYSICAL EXAM:  Wt Readings from Last 3 Encounters:  04/21/17 202 lb 9.6 oz (91.9 kg)  04/17/17 202 lb 9.6 oz (91.9 kg)  03/18/17 199 lb (90.3 kg)   Temp Readings from Last 3 Encounters:  05/02/17 98.3 F (36.8 C)  03/17/17 98.2 F (36.8 C) (Oral)  11/06/16 98.4 F (36.9 C) (Oral)   BP Readings from Last 3 Encounters:  05/02/17 (!) 168/86  04/21/17 (!) 165/85  04/17/17 134/84   Pulse Readings from Last 3 Encounters:  05/02/17 60  04/21/17 (!) 59  04/17/17 63     In general this is a well appearing African American female in no acute distress. She is alert and oriented x4 and appropriate throughout the examination. HEENT reveals that the patient is normocephalic, atraumatic. EOMs are intact. PERRLA. Skin is intact without any evidence of gross lesions. Cardiovascular exam reveals a regular rate and rhythm, no clicks rubs or murmurs are auscultated. Chest is clear to auscultation bilaterally. Lymphatic assessment is performed and does not reveal any adenopathy in the cervical, or supraclavicular chains. Bilateral breast exam is performed and reveals thickening of the left breast, and no palpable masses. She has fullness in the left axilla and along the upper outer quadrant of the right breast. No nipple bleeding or discharge is noted of either breast. Abdomen has active bowel sounds in all quadrants and is intact. The abdomen is soft, non tender, non distended. Lower extremities are negative for pretibial pitting edema, deep calf tenderness, cyanosis or clubbing.   ECOG = 1  0 - Asymptomatic (Fully active, able to carry on all predisease activities without restriction)  1 - Symptomatic but completely ambulatory (Restricted in physically strenuous activity but ambulatory and able to carry out work of a light or sedentary nature. For example,  light housework, office work)  2 - Symptomatic, <50% in bed during the day (Ambulatory and capable of all self care but unable to carry out any work activities. Up and about more than 50% of waking hours)  3 - Symptomatic, >50% in bed, but not bedbound (Capable of only limited self-care, confined to bed or chair 50% or more of waking hours)  4 - Bedbound (Completely disabled. Cannot carry on any self-care. Totally confined to bed or chair)  5 - Death   Eustace Pen MM, Creech RH, Tormey DC, et al. [phone]). "Toxicity and response criteria of the [facility]". [place] Oncol. 5 (6): 649-55    LABORATORY DATA:  Lab Results  Component Value Date   WBC 6.3 05/02/2017   HGB 11.6 (L) 05/02/2017   HCT 34.0 (L) 05/02/2017   MCV 79.6 05/02/2017  PLT 206 05/02/2017   Lab Results  Component Value Date   NA 139 05/02/2017   K 4.5 05/02/2017   CL 109 05/02/2017   CO2 19 (L) 05/02/2017   Lab Results  Component Value Date   ALT 22 05/02/2017   AST 20 05/02/2017   ALKPHOS 69 05/02/2017   BILITOT 0.6 05/02/2017      RADIOGRAPHY: No results found.     IMPRESSION/PLAN: 1. Stage IIA, cT2N2M0 grade 2, Triple positive invasive ductal carcinoma of the right breast. Dr. Lisbeth Renshaw discusses the pathology findings and reviews the nature of triple positive breast disease. The consensus from the breast conference includes proceeding with MRI and neoadjuvant chemotherapy, meeting with genetic counseling. This may determine how she proceeds surgically. She could become a candidate for breast conservation with lumpectomy with targeted node dissection. Depending on the size of the final tumor measurements rendered by pathology, The patient's course would then be followed by adjuvant external radiotherapy to the breast followed by antiestrogen therapy. We discussed the risks, benefits, short, and long term effects of radiotherapy, and the patient is interested in proceeding. Dr. Lisbeth Renshaw  discusses the delivery and logistics of radiotherapy and anticipates a course of 6 1/2 weeks of radiotherapy to the chest and regional nodes. We will see her back about 2 weeks after surgery to discuss the simulation process and anticipate we starting radiotherapy about 4-6 weeks after surgery.  2. Possible genetic predisposition to malignancy. The patient is a candidate for genetic testing given her personal and family history. She was offered referral and is interested in testing. She will be scheduled for this and given an appointment today.      The above documentation reflects my direct findings during this shared patient visit. Please see the separate note by Dr. Lisbeth Renshaw on this date for the remainder of the patient's plan of care.    Carola Rhine, PAC

## 2018-03-25 NOTE — Telephone Encounter (Signed)
Gave patient AVs and calendar of upcoming April and may appointments.  °

## 2018-03-26 ENCOUNTER — Telehealth: Payer: Self-pay | Admitting: Oncology

## 2018-03-26 ENCOUNTER — Telehealth: Payer: Self-pay

## 2018-03-26 ENCOUNTER — Telehealth (HOSPITAL_COMMUNITY): Payer: Self-pay | Admitting: Vascular Surgery

## 2018-03-26 NOTE — Telephone Encounter (Signed)
Left pt message to make new brst appt w/ echo in July w/ Mclean or DB

## 2018-03-26 NOTE — Telephone Encounter (Signed)
Per 4/24 no los

## 2018-03-26 NOTE — Telephone Encounter (Signed)
   Primary Cardiologist:Mark Marlou Porch, MD  Chart reviewed as part of pre-operative protocol coverage. Because of [name] [name]'s past medical history and time since last visit, he/she will require a follow-up visit in order to better assess preoperative cardiovascular risk. H/o severe LVH, HTN, morbid obesity, TIA, murmur. At time of last OV 04/2017, it was recommended she f/u in 6 months.  Pre-op covering staff: - Please schedule appointment and call patient to inform them. -> please expedite. - Please contact requesting surgeon's office via preferred method (i.e, phone, fax) to inform them of need for appointment prior to surgery.  Charlie Pitter, PA-C  03/26/2018, 4:49 PM

## 2018-03-26 NOTE — Telephone Encounter (Signed)
   [facility] Pre-operative Risk Assessment    Request for surgical clearance:  1. What type of surgery is being performed? Port a cath placement   2. When is this surgery scheduled? Pending    3. What type of clearance is required (medical clearance vs. Pharmacy clearance to hold med vs. Both)? Medical   4. Are there any medications that need to be held prior to surgery and how long? Aspirin, not listed   5. Practice name and name of physician performing surgery? [facility], Dr. Marlou Starks   6. What is your office phone number [phone]    7.   What is your office fax number [phone] attn Carlene Coria, [place]  8.   Anesthesia type (None, local, MAC, general) ? General    Joaquim Lai 03/26/2018, 3:33 PM  _________________________________________________________________   (provider comments below)

## 2018-03-27 NOTE — Telephone Encounter (Signed)
Patient notified of need for appointment. Patient is scheduled for port placement on 04/08/18. Appointment scheduled for 04/01/18 at 2:00 p with Cristela Blue, PA at our [place] office. Patient verbalized understanding and agreed with plan. Patient familiar with [facility] and given directions to our office.  [facility] notified.

## 2018-03-30 ENCOUNTER — Inpatient Hospital Stay: Payer: 59

## 2018-03-30 ENCOUNTER — Ambulatory Visit (HOSPITAL_COMMUNITY)
Admission: RE | Admit: 2018-03-30 | Discharge: 2018-03-30 | Disposition: A | Discharge status: Home / Self Care | Payer: 59 | Source: Ambulatory Visit | Attending: Oncology | Admitting: Oncology

## 2018-03-30 ENCOUNTER — Encounter: Payer: Self-pay | Admitting: Oncology

## 2018-03-30 ENCOUNTER — Telehealth: Payer: Self-pay | Admitting: Oncology

## 2018-03-30 DIAGNOSIS — C50411 Malignant neoplasm of upper-outer quadrant of right female breast: Secondary | ICD-10-CM | POA: Insufficient documentation

## 2018-03-30 DIAGNOSIS — Z170 Estrogen receptor positive status [ER+]: Secondary | ICD-10-CM | POA: Diagnosis not present

## 2018-03-30 DIAGNOSIS — C7989 Secondary malignant neoplasm of other specified sites: Secondary | ICD-10-CM | POA: Insufficient documentation

## 2018-03-30 NOTE — Telephone Encounter (Signed)
Left message informing patient FMLA has been successfully faxed to [place] Department at [phone]. Mailed copy to address on file.

## 2018-03-30 NOTE — Progress Notes (Signed)
Medical records requested from Dr. Jolinda Croak an oncologist from [place]. Faxed request to [phone], confirmation received. Chemo notes were requested.

## 2018-03-30 NOTE — Progress Notes (Signed)
  Echocardiogram 2D Echocardiogram has been performed.  [name] F 03/30/2018, 10:54 AM

## 2018-03-31 ENCOUNTER — Ambulatory Visit (HOSPITAL_COMMUNITY)
Admission: RE | Admit: 2018-03-31 | Discharge: 2018-03-31 | Disposition: A | Discharge status: Home / Self Care | Payer: 59 | Source: Ambulatory Visit | Attending: Oncology | Admitting: Oncology

## 2018-03-31 DIAGNOSIS — C7989 Secondary malignant neoplasm of other specified sites: Secondary | ICD-10-CM | POA: Diagnosis not present

## 2018-03-31 DIAGNOSIS — Z170 Estrogen receptor positive status [ER+]: Secondary | ICD-10-CM

## 2018-03-31 DIAGNOSIS — C50919 Malignant neoplasm of unspecified site of unspecified female breast: Secondary | ICD-10-CM | POA: Diagnosis not present

## 2018-03-31 DIAGNOSIS — C50411 Malignant neoplasm of upper-outer quadrant of right female breast: Secondary | ICD-10-CM | POA: Diagnosis not present

## 2018-03-31 MED ORDER — GADOBENATE DIMEGLUMINE 529 MG/ML IV SOLN
20.0000 mL | Freq: Once | INTRAVENOUS | Status: AC | PRN
  Administered 2018-03-31: 20 mL via INTRAVENOUS

## 2018-04-01 ENCOUNTER — Ambulatory Visit: Payer: 59 | Admitting: Cardiology

## 2018-04-01 ENCOUNTER — Encounter: Payer: Self-pay | Admitting: Cardiology

## 2018-04-01 VITALS — BP 146/87 | HR 53 | Ht 59.0 in | Wt 212.0 lb

## 2018-04-01 DIAGNOSIS — I10 Essential (primary) hypertension: Secondary | ICD-10-CM | POA: Diagnosis not present

## 2018-04-01 DIAGNOSIS — E669 Obesity, unspecified: Secondary | ICD-10-CM | POA: Diagnosis not present

## 2018-04-01 DIAGNOSIS — Z8673 Personal history of transient ischemic attack (TIA), and cerebral infarction without residual deficits: Secondary | ICD-10-CM | POA: Diagnosis not present

## 2018-04-01 DIAGNOSIS — C50411 Malignant neoplasm of upper-outer quadrant of right female breast: Secondary | ICD-10-CM

## 2018-04-01 DIAGNOSIS — Z01810 Encounter for preprocedural cardiovascular examination: Secondary | ICD-10-CM

## 2018-04-01 DIAGNOSIS — Z170 Estrogen receptor positive status [ER+]: Secondary | ICD-10-CM | POA: Diagnosis not present

## 2018-04-01 NOTE — Assessment & Plan Note (Signed)
No symptoms to suggest sleep apnea 

## 2018-04-01 NOTE — Assessment & Plan Note (Signed)
June 2018

## 2018-04-01 NOTE — Assessment & Plan Note (Signed)
Normal LVF, mild LVH, grade 2 DD

## 2018-04-01 NOTE — Progress Notes (Signed)
04/01/2018 [name] [name]   October 14, 1971  [id number]  Primary Physician [name] Stalker, PA-C Primary Cardiologist: Dr [name] [name]  HPI:  Pleasant 47 y/o female followed by Dr [name] [name] with a past history of a TIA in June 2018, HTN, and breast cancer in 2000. Dr [name] [name] had refered her to Dr [name] [name] last year and renal dopplers were ordered but she never had them done. Unfortunately she has had recurrence of her breast cancer. She need chemotherapy followed by surgery and is here for pre op clearance. She says her B/P is usually controlled oat home "130/80".  She rarely has to take her clonidine- PRN for systolic B/P > 563. She denies angina or dyspnea. She is overweight but denies poor sleep or daytime fatigue. Her echo done 03/2918 prior to chemotherapy showed normal LVF, mild LVH, and grade 2 diastolic dysfunction.    Current Outpatient Medications  Medication Sig Dispense Refill  . acetaminophen (TYLENOL) 500 MG tablet Take 500 mg by mouth every 6 (six) hours as needed for headache (pain).    Marland Kitchen aspirin EC 81 MG EC tablet Take 1 tablet (81 mg total) by mouth daily. 30 tablet 0  . BYSTOLIC 20 MG TABS TAKE 1 TABLET (20 MG TOTAL) BY MOUTH DAILY. 30 tablet 6  . cloNIDine (CATAPRES) 0.2 MG tablet Take 0.2 mg by mouth at bedtime.     . Multiple Vitamin (MULITIVITAMIN WITH MINERALS) TABS Take 1 tablet by mouth daily.      Marland Kitchen spironolactone (ALDACTONE) 50 MG tablet TAKE 1 TABLET (50 MG TOTAL) BY MOUTH DAILY. 30 tablet 6  . ferrous sulfate 325 (65 FE) MG tablet Take 1 tablet (325 mg total) by mouth daily. 30 tablet 0   No current facility-administered medications for this visit.     Allergies  Allergen Reactions  . Ace Inhibitors Swelling    Swelling of lips and face  . Latex Itching and Rash  . Lisinopril Swelling    Swelling of lips, face  . Amlodipine Swelling    Leg swelling  . Adhesive [Tape] Itching and Rash    Please use paper tape    Past Medical History:  Diagnosis Date  . Breast  cancer, left breast ([place]) 2000   Underwent lumpectomy, chemotherapy and radiation  . Facial paralysis/Bells palsy    left side  . Hypertension   . Renal disorder   . TIA (transient ischemic attack) 03/16/2017   [name]    Social History   Socioeconomic History  . Marital status: Married    Spouse name: [name] [name]  . Number of children: 3  . Years of education: 7  . 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 Financial resource strain: Not on file  . Food insecurity:    Worry: Not on file    Inability: Not on file  . Transportation needs:    Medical: Not on file    Non-medical: Not on file  Tobacco Use  . Smoking status: Never Smoker  . Smokeless tobacco: Never Used  Substance and Sexual Activity  . Alcohol use: No  . Drug use: No  . Sexual activity: Yes    Partners: Male    Birth control/protection: Surgical  Lifestyle  . Physical activity:    Days per week: Not on file    Minutes per session: Not on file  . Stress: Not on file  Relationships  . Social connections:  Talks on phone: Not on file    Gets together: Not on file    Attends religious service: Not on file    Active member of club or organization: Not on file    Attends meetings of clubs or organizations: Not on file    Relationship status: Not on file  . Intimate partner violence:    Fear of current or ex partner: Not on file    Emotionally abused: Not on file    Physically abused: Not on file    Forced sexual activity: Not on file  Other Topics Concern  . Not on file  Social History Narrative   Lives with family   No caffeine     Family History  Problem Relation Age of Onset  . Hypertension Mother   . Hypertension Father   . Diverticulosis Father   . Diabetes Father   . Lung cancer Father   . Hypertension Maternal Grandmother   . CAD Maternal Grandmother   . Hypertension Paternal Grandmother   . CAD Paternal  Grandmother   . Stroke Paternal Grandfather      Review of Systems: General: negative for chills, fever, night sweats or weight changes.  Cardiovascular: negative for chest pain, dyspnea on exertion, edema, orthopnea, palpitations, paroxysmal nocturnal dyspnea or shortness of breath Dermatological: negative for rash Respiratory: negative for cough or wheezing Urologic: negative for hematuria Abdominal: negative for nausea, vomiting, diarrhea, bright red blood per rectum, melena, or hematemesis Neurologic: negative for visual changes, syncope, or dizziness All other systems reviewed and are otherwise negative except as noted above.    Blood pressure (!) 146/87, pulse (!) 53, height 4\' 11"  (1.499 m), weight 212 lb (96.2 kg).  General appearance: alert, cooperative, no distress and moderately obese Neck: no carotid bruit and no JVD Lungs: clear to auscultation bilaterally Heart: regular rate and rhythm Extremities: trace edema Skin: Skin color, texture, turgor normal. No rashes or lesions Neurologic: Grossly normal  EKG NSR 54, NSST changes  ASSESSMENT AND PLAN:   Pre-operative cardiovascular examination Pt is cleared for PICC line and breast surgery without further cardiac work up.  We will be available as needed peri op.   Malignant neoplasm of upper-outer quadrant of right breast in female, estrogen receptor positive ([place]) Followed by Dr [name] [name]- pt will need chemo and surgery  Essential hypertension Normal LVF, mild LVH, grade 2 DD  History of TIA (transient ischemic attack) June 2018  Obesity (BMI 30-39.9) No symptoms to suggest sleep apnea.    PLAN  Pt cleared from cardiac standpoint for surgery. Follow up with Dr [name] [name] in 6 months.   Kerin Ransom PA-C 04/01/2018 2:56 PM

## 2018-04-01 NOTE — Assessment & Plan Note (Signed)
Pt is cleared for PICC line and breast surgery without further cardiac work up.  We will be available as needed peri op.

## 2018-04-01 NOTE — Patient Instructions (Signed)
Medication Instructions:  Your physician recommends that you continue on your current medications as directed. Please refer to the Current Medication list given to you today.  Labwork: None  Testing/Procedures: None  Follow-Up: Your physician wants you to follow-up in: 6 months with Dr. Skains.  You will receive a reminder letter in the mail two months in advance. If you don't receive a letter, please call our office to schedule the follow-up appointment.   Any Other Special Instructions Will Be Listed Below (If Applicable).     If you need a refill on your cardiac medications before your next appointment, please call your pharmacy.   

## 2018-04-01 NOTE — Assessment & Plan Note (Signed)
Followed by [name]- pt will need chemo and surgery

## 2018-04-03 ENCOUNTER — Encounter (HOSPITAL_COMMUNITY)
Admission: RE | Admit: 2018-04-03 | Discharge: 2018-04-03 | Disposition: A | Discharge status: Home / Self Care | Payer: 59 | Source: Ambulatory Visit | Attending: Oncology | Admitting: Oncology

## 2018-04-03 ENCOUNTER — Ambulatory Visit (HOSPITAL_COMMUNITY)
Admission: RE | Admit: 2018-04-03 | Discharge: 2018-04-03 | Disposition: A | Discharge status: Home / Self Care | Payer: 59 | Source: Ambulatory Visit | Attending: Oncology | Admitting: Oncology

## 2018-04-03 ENCOUNTER — Telehealth: Payer: Self-pay | Admitting: *Deleted

## 2018-04-03 DIAGNOSIS — Z170 Estrogen receptor positive status [ER+]: Secondary | ICD-10-CM

## 2018-04-03 DIAGNOSIS — M899 Disorder of bone, unspecified: Secondary | ICD-10-CM | POA: Insufficient documentation

## 2018-04-03 DIAGNOSIS — C50411 Malignant neoplasm of upper-outer quadrant of right female breast: Secondary | ICD-10-CM | POA: Diagnosis not present

## 2018-04-03 DIAGNOSIS — C50919 Malignant neoplasm of unspecified site of unspecified female breast: Secondary | ICD-10-CM | POA: Diagnosis not present

## 2018-04-03 DIAGNOSIS — C50911 Malignant neoplasm of unspecified site of right female breast: Secondary | ICD-10-CM | POA: Diagnosis not present

## 2018-04-03 DIAGNOSIS — R590 Localized enlarged lymph nodes: Secondary | ICD-10-CM | POA: Diagnosis not present

## 2018-04-03 DIAGNOSIS — C779 Secondary and unspecified malignant neoplasm of lymph node, unspecified: Secondary | ICD-10-CM | POA: Diagnosis not present

## 2018-04-03 MED ORDER — TECHNETIUM TC 99M MEDRONATE IV KIT
19.8000 | PACK | Freq: Once | INTRAVENOUS | Status: AC | PRN
  Administered 2018-04-03: 19.8 via INTRAVENOUS

## 2018-04-03 MED ORDER — IOHEXOL 300 MG/ML  SOLN
75.0000 mL | Freq: Once | INTRAMUSCULAR | Status: AC | PRN
  Administered 2018-04-03: 75 mL via INTRAVENOUS

## 2018-04-03 NOTE — Telephone Encounter (Signed)
  Oncology Nurse Navigator Documentation  Navigator Location: [facility] (04/03/18 1200)   )Navigator Encounter Type: Telephone;[facility]C Follow-up (04/03/18 1200) Telephone: Outgoing Call;Clinic/[facility]C Follow-up (04/03/18 1200)                 Treatment Initiated Date: 04/16/18 (04/03/18 1200)                                Time Spent with Patient: 15 (04/03/18 1200)

## 2018-04-03 NOTE — Pre-Procedure Instructions (Signed)
[place]  04/03/2018      [id number] Lorina Rabon, [place] [address] [place] [address] Phone: [phone] Fax: [phone]    Your procedure is scheduled on 04/08/2018.  Report to [facility] Admitting at [place].M.  Call this number if you have problems the morning of surgery:  [phone]   Remember:  Do not eat food or drink liquids after midnight.   Continue all medications as directed by your physician except follow these medication instructions before surgery below    Take these medicines the morning of surgery with A SIP OF WATER: Acetaminophen (Tylenol) - if needed Bystolic Clonidine (Catapres)  7 days prior to surgery STOP taking any Aspirin(unless otherwise instructed by your surgeon), Aleve, Naproxen, Ibuprofen, Motrin, Advil, Goody's, BC's, all herbal medications, fish oil, and all vitamins  Follow your doctors instructions regarding your Aspirin.  If no instructions were given by your doctor, then you will need to call the prescribing office office to get instructions.      Do not wear jewelry, make-up or nail polish.  Do not wear lotions, powders, or perfumes, or deodorant.  Do not shave 48 hours prior to surgery.    Do not bring valuables to the hospital.  [facility] is not responsible for any belongings or valuables.  Hearing aids, eyeglasses, contacts, dentures or bridgework may not be worn into surgery.  Leave your suitcase in the car.  After surgery it may be brought to your room.  For patients admitted to the hospital, discharge time will be determined by your treatment team.  Patients discharged the day of surgery will not be allowed to drive home.   Name and phone number of your driver:    Special instructions:   [facility]- Preparing For Surgery  Before surgery, you can play an important role. Because skin is not sterile, your skin needs to be as free of germs as possible. You can reduce the  number of germs on your skin by washing with CHG (chlorahexidine gluconate) Soap before surgery.  CHG is an antiseptic cleaner which kills germs and bonds with the skin to continue killing germs even after washing.  Please do not use if you have an allergy to CHG or antibacterial soaps. If your skin becomes reddened/irritated stop using the CHG.  Do not shave (including legs and underarms) for at least 48 hours prior to first CHG shower. It is OK to shave your face.  Please follow these instructions carefully.   1. Shower the NIGHT BEFORE SURGERY and the MORNING OF SURGERY with CHG.   2. If you chose to wash your hair, wash your hair first as usual with your normal shampoo.  3. After you shampoo, rinse your hair and body thoroughly to remove the shampoo.  4. Use CHG as you would any other liquid soap. You can apply CHG directly to the skin and wash gently with a scrungie or a clean washcloth.   5. Apply the CHG Soap to your body ONLY FROM THE NECK DOWN.  Do not use on open wounds or open sores. Avoid contact with your eyes, ears, mouth and genitals (private parts). Wash Face and genitals (private parts)  with your normal soap.  6. Wash thoroughly, paying special attention to the area where your surgery will be performed.  7. Thoroughly rinse your body with warm water from the neck down.  8. DO NOT shower/wash with your normal soap after using  and rinsing off the CHG Soap.  9. Pat yourself dry with a CLEAN TOWEL.  10. Wear CLEAN PAJAMAS to bed the night before surgery, wear comfortable clothes the morning of surgery  11. Place CLEAN SHEETS on your bed the night of your first shower and DO NOT SLEEP WITH PETS.    Day of Surgery: Shower as stated above. Do not apply any deodorants/lotions.  Please wear clean clothes to the hospital/surgery center.      Please read over the following fact sheets that you were given.

## 2018-04-06 ENCOUNTER — Telehealth (HOSPITAL_COMMUNITY): Payer: Self-pay | Admitting: Vascular Surgery

## 2018-04-06 ENCOUNTER — Other Ambulatory Visit: Payer: Self-pay | Admitting: *Deleted

## 2018-04-06 ENCOUNTER — Other Ambulatory Visit: Payer: Self-pay

## 2018-04-06 ENCOUNTER — Encounter (HOSPITAL_COMMUNITY): Payer: Self-pay

## 2018-04-06 ENCOUNTER — Telehealth: Payer: Self-pay | Admitting: *Deleted

## 2018-04-06 ENCOUNTER — Other Ambulatory Visit: Payer: Self-pay | Admitting: Oncology

## 2018-04-06 ENCOUNTER — Encounter (HOSPITAL_COMMUNITY)
Admission: RE | Admit: 2018-04-06 | Discharge: 2018-04-06 | Disposition: A | Discharge status: Home / Self Care | Payer: 59 | Source: Ambulatory Visit | Attending: General Surgery | Admitting: General Surgery

## 2018-04-06 DIAGNOSIS — Z8673 Personal history of transient ischemic attack (TIA), and cerebral infarction without residual deficits: Secondary | ICD-10-CM | POA: Diagnosis not present

## 2018-04-06 DIAGNOSIS — Z853 Personal history of malignant neoplasm of breast: Secondary | ICD-10-CM | POA: Diagnosis not present

## 2018-04-06 DIAGNOSIS — Z170 Estrogen receptor positive status [ER+]: Secondary | ICD-10-CM | POA: Diagnosis not present

## 2018-04-06 DIAGNOSIS — Z823 Family history of stroke: Secondary | ICD-10-CM | POA: Diagnosis not present

## 2018-04-06 DIAGNOSIS — Z7982 Long term (current) use of aspirin: Secondary | ICD-10-CM | POA: Diagnosis not present

## 2018-04-06 DIAGNOSIS — N289 Disorder of kidney and ureter, unspecified: Secondary | ICD-10-CM | POA: Diagnosis not present

## 2018-04-06 DIAGNOSIS — Z923 Personal history of irradiation: Secondary | ICD-10-CM | POA: Diagnosis not present

## 2018-04-06 DIAGNOSIS — Z833 Family history of diabetes mellitus: Secondary | ICD-10-CM | POA: Diagnosis not present

## 2018-04-06 DIAGNOSIS — Z9221 Personal history of antineoplastic chemotherapy: Secondary | ICD-10-CM | POA: Diagnosis not present

## 2018-04-06 DIAGNOSIS — Z8249 Family history of ischemic heart disease and other diseases of the circulatory system: Secondary | ICD-10-CM | POA: Diagnosis not present

## 2018-04-06 DIAGNOSIS — Z801 Family history of malignant neoplasm of trachea, bronchus and lung: Secondary | ICD-10-CM | POA: Diagnosis not present

## 2018-04-06 DIAGNOSIS — C50411 Malignant neoplasm of upper-outer quadrant of right female breast: Secondary | ICD-10-CM | POA: Diagnosis not present

## 2018-04-06 DIAGNOSIS — Z91048 Other nonmedicinal substance allergy status: Secondary | ICD-10-CM | POA: Diagnosis not present

## 2018-04-06 DIAGNOSIS — I10 Essential (primary) hypertension: Secondary | ICD-10-CM | POA: Diagnosis not present

## 2018-04-06 DIAGNOSIS — G510 Bell's palsy: Secondary | ICD-10-CM | POA: Diagnosis not present

## 2018-04-06 DIAGNOSIS — Z79899 Other long term (current) drug therapy: Secondary | ICD-10-CM | POA: Diagnosis not present

## 2018-04-06 DIAGNOSIS — Z888 Allergy status to other drugs, medicaments and biological substances status: Secondary | ICD-10-CM | POA: Diagnosis not present

## 2018-04-06 DIAGNOSIS — C773 Secondary and unspecified malignant neoplasm of axilla and upper limb lymph nodes: Secondary | ICD-10-CM | POA: Diagnosis not present

## 2018-04-06 DIAGNOSIS — Z91040 Latex allergy status: Secondary | ICD-10-CM | POA: Diagnosis not present

## 2018-04-06 HISTORY — DX: Anxiety disorder, unspecified: F41.9

## 2018-04-06 NOTE — Progress Notes (Signed)
PCP - Marda Stalker Cardiologist - Skains - clearance note in epic  Chest x-ray - not needed EKG - 04/01/18 Stress Test denies-  ECHO - 03/30/18 Cardiac Cath - denies  Aspirin Instructions: last dose was 4/26  Anesthesia review: yes   Patient denies shortness of breath, fever, cough and chest pain at PAT appointment   Patient verbalized understanding of instructions that were given to them at the PAT appointment. Patient was also instructed that they will need to review over the PAT instructions again at home before surgery.

## 2018-04-06 NOTE — Telephone Encounter (Signed)
Left vm for pt to return call to discuss CT/bone scan and recommendations. Contact information provided.

## 2018-04-06 NOTE — Telephone Encounter (Signed)
Left pt message to make new brst appt w. Echo will sent letter

## 2018-04-07 ENCOUNTER — Encounter: Payer: Self-pay | Admitting: Oncology

## 2018-04-07 ENCOUNTER — Encounter: Payer: Self-pay | Admitting: *Deleted

## 2018-04-07 ENCOUNTER — Other Ambulatory Visit: Payer: Self-pay | Admitting: *Deleted

## 2018-04-07 DIAGNOSIS — C50411 Malignant neoplasm of upper-outer quadrant of right female breast: Secondary | ICD-10-CM

## 2018-04-07 DIAGNOSIS — Z170 Estrogen receptor positive status [ER+]: Secondary | ICD-10-CM

## 2018-04-07 NOTE — Progress Notes (Signed)
Patient returned call. Introduced myself and the reason for my call. Asked if she has met her ded/OOP for her insurance. She states she was not sure. Advised her to contact her insurance company to obtain that information and if she has not, I can apply on her behalf for copay assistance to assist with her responsibility for Herceptin and Perjeta through [place] through [employer]. Patient verbalized understanding.  Discussed the one-time $1000 in house [employer] grant to assist with personal expenses while going through treatment. Gave her the household guidelines for her household size of 5. She states they make less than the amount given. Advised her to bring proof of income(2018 tax return) to apply for grant on 04/16/18. She verbalized understanding.  She has my name and number to follow back up regarding whether or not she will need the copay assistance or not.

## 2018-04-07 NOTE — Progress Notes (Signed)
[place]  Telephone:(336) [phone] Fax:(336) [phone]     ID: [place] Merriweather DOB: 1971/11/12  MR#: [id number]  HFW#:[id number]  Patient Care Team: Marda Stalker, PA-C as PCP - General (Family Medicine) Jerline Pain, MD as PCP - Cardiology (Cardiology) Magrinat, Virgie Dad, MD as Consulting Physician (Oncology) Loreta Ave, MD as Referring Physician (Internal Medicine) Donato Heinz, MD as Consulting Physician (Nephrology) Kyung Rudd, MD as Consulting Physician (Radiation Oncology) Jovita Kussmaul, MD as Consulting Physician (General Surgery) OTHER MD:  CHIEF COMPLAINT: Triple positive breast cancer  CURRENT TREATMENT: Neoadjuvant chemotherapy   HISTORY OF CURRENT ILLNESS: From the original intake note:  [place] Bulnes palpated a lump on 01/15/2018. She felt soreness and shooting pain under her arm and in the right breast. She underwent bilateral diagnostic mammography with tomography and right breast ultrasonography at [facility] on 03/12/2018 showing: breast density category C. The is architectural distortion at the 11 o'clock position. There is also an oval mass in the right breast anterior depth. Examination of the right axilla showed enlarged lymph nodes. Ultrasonography showed a 4.6 cm mass in the right breast upper outer quadrant posterior depth. An additional 1.2 cm oval mass in the right breast 12 o'clock middle depth. The lymph nodes in the right axillary are highly suggestive of malignancy.   Accordingly on 03/16/2018 she proceeded to biopsy of the right breast area in question. The pathology from this procedure showed ([id number]): At both the 11 and 12 o'clock positions: Invasive ductal carcinoma grade II. Ductal carcinoma in situ, high grade, with necrosis and calcifications. Prognostic indicators significant for: estrogen receptor, 90% positive and progesterone receptor, 40% positive, both with strong staining intensity. Proliferation marker Ki67 at 80%.  HER2 amplified with ratios HER2/CEP17 SIGNALS 6.90 and average HER2 copies per cell 14.50  The patient's subsequent history is as detailed below.  INTERVAL HISTORY: [place] returns today for follow up and treatment of her triple positive breast cancer accompanied by her husband. She had port insertion on 04/08/2018. She begins docetaxel and carboplatin, trastuzumab and pertuzumab given every 21 days starting 04/16/2018. She had port placement on 04/08/2018. She denies any complications, bleeding, or fever after this.   She completed a bone scan on 04/03/2018 showing: No scintigraphic evidence of skeletal metastases. She also completed a chest CT on 04/03/2018 showing: Right breast carcinoma with right axillary adenopathy. No findings to suggest mediastinal or hilar adenopathy and no evidence for pulmonary metastasis. There is a suspicious lucent lesion within the thoracic spine, worrisome for metastatic disease. In keeping with the behavior of lytic bone lesions this is not radiotracer avid on bone scan from today. Consider further staging workup with PET-CT to assess for additional sites of disease.  Given the CT scan concern regarding a thoracic metastasis she was scheduled for a thoracic MRI, but faced with the machine she got cold feet and could not get through it.  That test is being rescheduled.  Echocardiogram 03/30/2018 showed an ejection fraction in the 60-65% range.  She is now ready to start her chemotherapy treatments.   REVIEW OF SYSTEMS: [place] reports that he is anxious about starting chemotherapy. She is finding peace through prayer. She had her port placed a week ago. She had some pain, especially after people hugging her. She aids any pain with tylenol. She denies unusual headaches, visual changes, nausea, vomiting, or dizziness. There has been no unusual cough, phlegm production, or pleurisy. This been no change in bowel or bladder habits. She denies unexplained fatigue or  unexplained weight loss, bleeding, rash, or fever. A detailed review of systems was otherwise stable.  PAST MEDICAL HISTORY: Past Medical History:  Diagnosis Date  . Anxiety   . Breast cancer, left breast ([place]) 2000   Underwent lumpectomy, chemotherapy and radiation  . Facial paralysis/Bells palsy    left side  . Hypertension   . Renal disorder    just after TIA acute kidney injury  . TIA (transient ischemic attack) 03/16/2017   [facility]   The patient has a previous history of left breast cancer diagnosed in 2000. She was seen by [facility] in [place], [place] under Dr. Loreta Ave. According to the patient, her left breast cancer was stage II with no lymph node involvement (likely T2N0). She had chemotherapy every 3 weeks for about 6 months. She did not takes any "red drugs." She also did not take anti-estrogens (likely ER/PR negative).   TIA in 2018. She saw a neurologist as a precaution. She denies any clotting issues.   PAST SURGICAL HISTORY: Past Surgical History:  Procedure Laterality Date  . BREAST LUMPECTOMY WITH AXILLARY LYMPH NODE BIOPSY Left 2000   biopsy  . PORTACATH PLACEMENT Left 04/08/2018   Procedure: INSERTION PORT-A-CATH;  Surgeon: Jovita Kussmaul, MD;  Location: [facility];  Service: General;  Laterality: Left;  . TUBAL LIGATION Bilateral 2004  . WISDOM TOOTH EXTRACTION      FAMILY HISTORY Family History  Problem Relation Age of Onset  . Hypertension Mother   . Hypertension Father   . Diverticulosis Father   . Diabetes Father   . Lung cancer Father   . Hypertension Maternal Grandmother   . CAD Maternal Grandmother   . Hypertension Paternal Grandmother   . CAD Paternal Grandmother   . Stroke Paternal Grandfather   The patient's father is alive at age 4 and had a history of lung cancer. The patient's mother is alive at age 101. The patient has 1 brother and no sisters. She denies a family history of breast or ovarian cancer.    GYNECOLOGIC  HISTORY:  No LMP recorded. (Menstrual status: Perimenopausal). Menarche: 47 years old Age at first live birth: 47 years old She is GXP3. She is no longer having periods, which were irregular and light. Her LMP was  January 2019 and  December 2018. She is not having hot flashes. The patient used oral contraceptive from [phone] and the Depo-provera shot from [phone] with no complications. She never used HRT.    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 Her husband, [name], is a Administrator. The patient's son, Shea Stakes age 28, works at [employer] in [place]. The patient's daughters, Lilia Pro age 62, and Levonne Spiller age 42, are students. The patient's adopted son, Vonna Kotyk age 66, is also a Ship broker.       ADVANCED DIRECTIVES:    HEALTH MAINTENANCE: Social History   Tobacco Use  . Smoking status: Never Smoker  . Smokeless tobacco: Never Used  Substance Use Topics  . Alcohol use: No  . Drug use: No     Colonoscopy:   PAP: 2015  Bone density:   Allergies  Allergen Reactions  . Ace Inhibitors Swelling    Swelling of lips and face  . Lisinopril Swelling    Swelling of lips, face  . Amlodipine Swelling    Leg swelling  . Adhesive [Tape] Itching and Rash    Please use paper tape  . Latex Itching and Rash    Current  Outpatient Medications  Medication Sig Dispense Refill  . acetaminophen (TYLENOL) 500 MG tablet Take 500 mg by mouth every 6 (six) hours as needed for headache (pain).    Marland Kitchen aspirin EC 81 MG EC tablet Take 1 tablet (81 mg total) by mouth daily. 30 tablet 0  . BYSTOLIC 20 MG TABS TAKE 1 TABLET (20 MG TOTAL) BY MOUTH DAILY. 30 tablet 6  . cloNIDine (CATAPRES) 0.2 MG tablet Take 0.2 mg by mouth at bedtime.     Marland Kitchen dexamethasone (DECADRON) 4 MG tablet Take 2 tablets (8 mg total) by mouth 2 (two) times daily. Start the day before Taxotere. Take once the day after, then 2 times a day x 2d. 30 tablet 1  . ferrous sulfate 325 (65 FE) MG tablet Take 1  tablet (325 mg total) by mouth daily. 30 tablet 0  . HYDROcodone-acetaminophen (NORCO/VICODIN) 5-325 MG tablet Take 1-2 tablets by mouth every 6 (six) hours as needed for moderate pain or severe pain. 10 tablet 0  . lidocaine-prilocaine (EMLA) cream Apply to affected area once 30 g 3  . LORazepam (ATIVAN) 0.5 MG tablet Take 1 tablet (0.5 mg total) by mouth at bedtime as needed (Nausea or vomiting). 30 tablet 0  . Multiple Vitamin (MULITIVITAMIN WITH MINERALS) TABS Take 1 tablet by mouth daily.      . prochlorperazine (COMPAZINE) 10 MG tablet Take 1 tablet (10 mg total) by mouth every 6 (six) hours as needed (Nausea or vomiting). 30 tablet 1  . spironolactone (ALDACTONE) 50 MG tablet TAKE 1 TABLET (50 MG TOTAL) BY MOUTH DAILY. 30 tablet 6   No current facility-administered medications for this visit.     OBJECTIVE: Aged African-American woman in no acute distress  Vitals:   04/14/18 1557  BP: (!) 142/84  Pulse: 62  Resp: 18  Temp: 99.3 F (37.4 C)  SpO2: 98%     Body mass index is 43.77 kg/m.   Wt Readings from Last 3 Encounters:  04/14/18 216 lb 11.2 oz (98.3 kg)  04/08/18 214 lb (97.1 kg)  04/06/18 214 lb (97.1 kg)      ECOG FS:1 - Symptomatic but completely ambulatory  Sclerae unicteric, EOMs intact Oropharynx clear and moist No cervical or supraclavicular adenopathy Lungs no rales or rhonchi Heart regular rate and rhythm Abd soft, nontender, positive bowel sounds MSK no focal spinal tenderness, no upper extremity lymphedema Neuro: nonfocal, well oriented, appropriate affect Breasts: The mass in the right breast is easily palpable, movable, and takes up almost the entire upper breast.  There is no erythema.  The left breast is benign.  I do not palpate obvious axillary adenopathy   LAB RESULTS:  CMP     Component Value Date/Time   NA 141 03/25/2018 1129   K 4.4 03/25/2018 1129   CL 105 03/25/2018 1129   CO2 25 03/25/2018 1129   GLUCOSE 86 03/25/2018 1129   BUN 21  03/25/2018 1129   CREATININE 1.29 (H) 03/25/2018 1129   CALCIUM 11.3 (H) 03/25/2018 1129   PROT 8.1 03/25/2018 1129   ALBUMIN 3.8 03/25/2018 1129   AST 22 03/25/2018 1129   ALT 29 03/25/2018 1129   ALKPHOS 84 03/25/2018 1129   BILITOT 0.3 03/25/2018 1129   GFRNONAA 49 (L) 03/25/2018 1129   GFRAA 57 (L) 03/25/2018 1129    No results found for: TOTALPROTELP, ALBUMINELP, A1GS, A2GS, BETS, BETA2SER, GAMS, MSPIKE, SPEI  No results found for: KPAFRELGTCHN, LAMBDASER, KAPLAMBRATIO  Lab Results  Component Value Date  WBC 5.9 03/25/2018   NEUTROABS 3.2 03/25/2018   HGB 13.1 03/25/2018   HCT 40.8 03/25/2018   MCV 83.6 03/25/2018   PLT 187 03/25/2018    '@LASTCHEMISTRY'$ @  No results found for: LABCA2  No components found for: ZDGLOV564  No results for input(s): INR in the last 168 hours.  No results found for: LABCA2  No results found for: PPI951  No results found for: OAC166  No results found for: AYT016  No results found for: CA2729  No components found for: HGQUANT  No results found for: CEA1 / No results found for: CEA1   No results found for: AFPTUMOR  No results found for: CHROMOGRNA  No results found for: PSA1  No visits with results within 3 Day(s) from this visit.  Latest known visit with results is:  Admission on 04/08/2018, Discharged on 04/08/2018  Component Date Value Ref Range Status  . Preg Test, Ur 04/08/2018 NEGATIVE  NEGATIVE Final   Comment:        THE SENSITIVITY OF THIS METHODOLOGY IS >24 mIU/mL     (this displays the last labs from the last 3 days)  No results found for: TOTALPROTELP, ALBUMINELP, A1GS, A2GS, BETS, BETA2SER, GAMS, MSPIKE, SPEI (this displays SPEP labs)  No results found for: KPAFRELGTCHN, LAMBDASER, KAPLAMBRATIO (kappa/lambda light chains)  No results found for: HGBA, HGBA2QUANT, HGBFQUANT, HGBSQUAN (Hemoglobinopathy evaluation)   No results found for: LDH  No results found for: IRON, TIBC, IRONPCTSAT (Iron and  TIBC)  No results found for: FERRITIN  Urinalysis    Component Value Date/Time   COLORURINE STRAW (A) 03/17/2017 0600   APPEARANCEUR CLEAR (A) 03/17/2017 0600   LABSPEC 1.031 (H) 03/17/2017 0600   PHURINE 5.0 03/17/2017 0600   GLUCOSEU NEGATIVE 03/17/2017 0600   HGBUR NEGATIVE 03/17/2017 0600   BILIRUBINUR NEGATIVE 03/17/2017 0600   KETONESUR NEGATIVE 03/17/2017 0600   PROTEINUR NEGATIVE 03/17/2017 0600   UROBILINOGEN 0.2 11/29/2011 1656   NITRITE NEGATIVE 03/17/2017 0600   LEUKOCYTESUR NEGATIVE 03/17/2017 0600     STUDIES: Ct Chest W Contrast  Result Date: 04/03/2018 CLINICAL DATA:  New diagnosis of breast cancer. EXAM: CT CHEST WITH CONTRAST TECHNIQUE: Multidetector CT imaging of the chest was performed during intravenous contrast administration. CONTRAST:  46m OMNIPAQUE IOHEXOL 300 MG/ML  SOLN COMPARISON:  None FINDINGS: Cardiovascular: The heart size appears within normal limits. No pericardial effusion. Mediastinum/Nodes: Normal appearance of the thyroid gland. The trachea appears patent and is midline. Normal appearance of the esophagus. Multiple enlarged right axillary lymph nodes are identified. Index lymph node measures 1.4 cm, image 33/2. Several small retropectoral lymph nodes are identified, image 32/2 and image 20/2. These measure up to 0.8 cm. Small right supraclavicular lymph nodes identified. Scattered subcentimeter mediastinal and hilar lymph nodes are noted. No adenopathy. Lungs/Pleura: No pleural effusion. Upper Abdomen: No acute abnormality identified. Musculoskeletal: There is a suspicious lucent lesion within the T6 vertebra measuring 1 cm, image 109/7. Asymmetric skin thickening involving the right breast identified. Metallic clips within the right breast indicating biopsy-proven carcinoma within the upper outer quadrant. IMPRESSION: 1. Right breast carcinoma with right axillary adenopathy. 2. No findings to suggest mediastinal or hilar adenopathy and no evidence for  pulmonary metastasis. 3. There is a suspicious lucent lesion within the thoracic spine, worrisome for metastatic disease. In keeping with the behavior of lytic bone lesions this is not radiotracer avid on bone scan from today. Consider further staging workup with PET-CT to assess for additional sites of disease. Electronically Signed  By: Kerby Moors M.D.   On: 04/03/2018 13:11   Nm Bone Scan Whole Body  Result Date: 04/03/2018 CLINICAL DATA:  Invasive breast cancer, initial staging, multiple metastatic lymph nodes EXAM: NUCLEAR MEDICINE WHOLE BODY BONE SCAN TECHNIQUE: Whole body anterior and posterior images were obtained approximately 3 hours after intravenous injection of radiopharmaceutical. RADIOPHARMACEUTICALS:  19.8 mCi Technetium-70mMDP IV COMPARISON:  Correlation with CT chest dated 04/03/2018 FINDINGS: No abnormal accumulation of radiotracer within the axillary or appendicular skeleton to suggest skeletal metastases. Mild uptake along the lateral aspect of L4-5 is favored to be degenerative. No radiotracer uptake in the midthoracic spine to correspond to the CT abnormality, which may reflect a vertebral hemangioma, although technically indeterminate. IMPRESSION: No scintigraphic evidence of skeletal metastases. Electronically Signed   By: SJulian HyM.D.   On: 04/03/2018 21:58   Mr Breast Bilateral W Wo Contrast Inc Cad  Result Date: 03/31/2018 CLINICAL DATA:  Invasive breast cancer, post biopsy. LABS:  Not applicable EXAM: BILATERAL BREAST MRI WITH AND WITHOUT CONTRAST TECHNIQUE: Multiplanar, multisequence MR images of both breasts were obtained prior to and following the intravenous administration of 19 ml of MultiHance. THREE-DIMENSIONAL MR IMAGE RENDERING ON INDEPENDENT WORKSTATION: Three-dimensional MR images were rendered by post-processing of the original MR data on an independent workstation. The three-dimensional MR images were interpreted, and findings are reported in the  following complete MRI report for this study. Three dimensional images were evaluated at the independent DynaCad workstation COMPARISON:  Previous exams including outside diagnostic mammogram and ultrasound dated 03/12/2018, outside ultrasound-guided biopsy dated 03/16/2018 and postprocedure mammogram dated 03/16/2018. FINDINGS: Breast composition: b. Scattered fibroglandular tissue. Background parenchymal enhancement: Mild Right breast: Dominant RIGHT breast mass and associated architectural distortion within the upper-outer quadrant, at posterior depth, measuring 5.1 x 4.2 x 5.4 cm (AP by transverse by craniocaudal dimensions), with associated biopsy clip artifact, consistent with biopsy-proven invasive carcinoma (series 10601, image 141; DynaCAD image 376). There are several smaller satellite nodules along the anterior-medial aspects of this dominant mass increasing the greatest oblique measurement to 6.2 cm. Additional biopsy-proven carcinoma within the upper RIGHT breast, 12 o'clock axis region by ultrasound, at anterior depth, with associated biopsy clip artifact, the combination of mass and non-mass enhancement at this site measuring 4.2 x 2.4 cm (series 10601, image 92). Additional ring-enhancing lesion within the upper-outer quadrant of the RIGHT breast, at anterior depth, measuring 1.6 x 0.8 cm, with mixed enhancement kinetics including some suspicious plateau and washout kinetics, highly suspicious for additional multicentric disease. Left breast: No suspicious enhancing mass, non-mass enhancement or secondary signs of malignancy within the LEFT breast. Lymph nodes: At least 7 abnormal appearing level 1 lymph nodes in the RIGHT axilla and RIGHT axillary tail, 1 of which is compatible with the biopsy-proven metastatic lymphadenopathy. At least 3 enlarged/morphologically abnormal retropectoral lymph nodes, level 2/3. Also several additional clustered mildly prominent retropectoral lymph nodes, suspicious  for additional metastatic lymphadenopathy. Ancillary findings: Irregular enhancing mass within the medial aspects of the RIGHT pectoralis muscle, extending posteriorly to the underlying intracostal space (medial adjacent to the sternum) where it has a maximum measurement of 2 cm AP thickness (series 10601, image 122). There is contiguous extension inferiorly to the next lower underlying intercostal space where it measures 9 mm greatest AP thickness (series 10601, image 145). There is further contiguous extension inferiorly to the next lower underlying intercostal space where it measures approximately 2.2 cm oblique thickness (series 10601, image 163). Suspect involvement of the underlying pleural surface  at 1 or more of these levels. Some component, possibly all components, may indicate metastatic lymphadenopathy of the internal mammary chain. Suspected cardiomegaly, incompletely imaged. IMPRESSION: 1. Dominant biopsy-proven carcinoma within the upper-outer quadrant of the RIGHT breast, at posterior depth, measuring 5.4 cm greatest dimension, with associated biopsy clip artifact. Several smaller satellite nodules at the anterior-medial aspects of this dominant mass increases the total oblique measurement to 6.2 cm greatest dimension. 2. Additional ring-enhancing lesion within the upper-outer quadrant of the RIGHT breast, at anterior depth, measuring 1.6 cm greatest dimension, highly suspicious for additional multicentric disease. Several small satellite nodules are seen posterior and medial to this ring-enhancing lesion, increasing the overall AP measurement to 2.6 cm and transverse measurement to 2.5 cm. 3. The satellite nodules surrounding the dominant biopsy-proven carcinoma within the upper-outer quadrant are nearly contiguous with the satellite nodules adjacent to the ring-enhancing lesion at anterior depth (impression #2) making the overall extent greater than 9.6 cm with involvement from posterior to anterior  depth (overall extent measured on series 10601, image 129). 4. Additional biopsy-proven carcinoma within the upper RIGHT breast, 12 o'clock axis by ultrasound, anterior depth, combination of associated mass and non-mass enhancement measuring 4.2 cm greatest dimension, with associated biopsy clip artifact. 5. At least 7 abnormal appearing level 1 lymph nodes in the RIGHT axilla and RIGHT axillary tail region, 1 of which is a biopsy-proven metastatic lymph node with associated biopsy clip artifact. 6. Multiple enlarged/morphologically abnormal retropectoral lymph nodes, level 2 and/or 3. 7. Irregular enhancing mass within the medial aspects of the RIGHT pectoralis muscle, extending posteriorly to the underlying intercostal space where it has a maximum measurement of 2 cm AP thickness. Additional contiguous extension inferiorly to the next 2 lower underlying intercostal spaces, measurements provided above. Also suspect involvement of the underlying pleural surface at 1 or more of these levels. Some component, possibly all components, may indicate metastatic lymphadenopathy involvement of the RIGHT internal mammary chain. 8. No evidence of malignancy within the LEFT breast. 9. Suspected cardiomegaly, incompletely imaged. RECOMMENDATION: 1. Per current treatment plan for patient's extensive multicentric carcinoma in the RIGHT breast (locations and measurements provided above), metastatic lymphadenopathy in the RIGHT axilla and RIGHT chest wall involvement as detailed above. 2. Consider chest CT and/or nuclear medicine bone scan to exclude osseous involvement and/or additional distant metastatic disease. 3. Consider chest x-ray to exclude cardiomegaly, if not already obtained. BI-RADS CATEGORY  6: Known biopsy-proven malignancy. Electronically Signed   By: Franki Cabot M.D.   On: 03/31/2018 16:29   Dg Chest Port 1 View  Result Date: 04/08/2018 CLINICAL DATA:  post porta cath  Placement today EXAM: PORTABLE CHEST 1  VIEW COMPARISON:  Chest CT, 04/03/2018 FINDINGS: Left anterior chest wall Port-A-Cath has been placed. Tip projects in the lower superior vena cava just above the caval atrial junction, well positioned. No pneumothorax. Cardiac silhouette is normal in size. No mediastinal or hilar masses. Minor lung base atelectasis.  Lungs otherwise clear. Stable changes from prior left breast surgery. IMPRESSION: 1. Well-positioned left anterior chest wall Port-A-Cath. 2. No acute cardiopulmonary disease.  No pneumothorax. Electronically Signed   By: Lajean Manes M.D.   On: 04/08/2018 10:06   Dg Fluoro Guide Cv Line-no Report  Result Date: 04/08/2018 Fluoroscopy was utilized by the requesting physician.  No radiographic interpretation.    ELIGIBLE FOR AVAILABLE RESEARCH PROTOCOL: no  ASSESSMENT: 47 y.o. [place], [place] woman  (1) status post left lumpectomy in 2000 for a (?) T2N0 breast cancer,  (  a) status post adjuvant chemotherapy  (b) status post adjuvant radiation  (c) did not receive antiestrogens  (2) genetics testing 05/06/2018  (3) status post right breast biopsy 03/16/2018 for a clinical mT2-3 N2, anatomic stage III invasive ductal carcinoma, grade 2, triple positive, with an MIB-1 of 80%.  (a) bone scan and chest CT scan negative for metastases except for a thoracic spine lytic lesion of concern  (4) neoadjuvant chemotherapy will consist of carboplatin, docetaxel, trastuzumab and Pertuzumab every 3 weeks x 6 starting 04/16/2018  (5) trastuzumab and Pertuzumab to be continued to a total of 6 months  (6) definitive surgery to follow  (7) adjuvant radiation to the right breast planned  (8) antiestrogens to start at the completion of local treatment  PLAN: [place] has had her echo, her port, and her staging scans.  The only area of concern is a single lytic lesion in her thoracic spine.  We discussed this at length today.  She says she would be willing to go through an MRI if she has something to  help her and of course lorazepam is now available to her through her premeds.  Hopefully we can get that done within the next 2 weeks.  Today I placed all her prescriptions in and I gave her a copy of the "roadmap" on how to take all her supportive medications.  I discussed it with her in detail so she would understand how to take the meds and how to read the map.  I also urged her to follow the map exactly the first time.  We can cut back on some of the doses with subsequent cycles if she does very well with the first 1.  She was planning to work on day 2.  I urged her not to but instead to take care of herself that day and subsequent days and keep a diary on all the symptoms that she may develop.  She will be starting on 04/16/2018.  She can expect some bony pain from the OnPro and I suggested that she try Aleve with Tylenol together although she is very reluctant to take any pain medicine.  I reassured her that this was not narcotic and not addictive  She will see [place] again approximately 1 week after her first cycle to troubleshoot side effects  She knows to call for any other issues that may develop before the next visit    Magrinat, Virgie Dad, MD  04/14/18 4:16 PM Medical Oncology and Hematology [facility] [place], [place] [address] Tel. [phone]    Fax. [phone]  This document serves as a record of services personally performed by Lurline Del, MD. It was created on his behalf by Sheron Nightingale, a trained medical scribe. The creation of this record is based on the scribe's personal observations and the provider's statements to them.   I have reviewed the above documentation for accuracy and completeness, and I agree with the above.

## 2018-04-07 NOTE — Progress Notes (Signed)
Called patient to introduce myself as Arboriculturist and to discuss options that may be available to her such as copay assistance. Left my contact name and number on voicemail.

## 2018-04-08 ENCOUNTER — Ambulatory Visit (HOSPITAL_COMMUNITY): Payer: 59 | Admitting: Certified Registered Nurse Anesthetist

## 2018-04-08 ENCOUNTER — Other Ambulatory Visit: Payer: Self-pay | Admitting: Oncology

## 2018-04-08 ENCOUNTER — Encounter (HOSPITAL_COMMUNITY): Payer: Self-pay | Admitting: General Practice

## 2018-04-08 ENCOUNTER — Ambulatory Visit (HOSPITAL_COMMUNITY)
Admission: RE | Admit: 2018-04-08 | Discharge: 2018-04-08 | Disposition: A | Discharge status: Home / Self Care | Payer: 59 | Source: Ambulatory Visit | Attending: General Surgery | Admitting: General Surgery

## 2018-04-08 ENCOUNTER — Ambulatory Visit (HOSPITAL_COMMUNITY): Payer: 59

## 2018-04-08 ENCOUNTER — Encounter (HOSPITAL_COMMUNITY)
Admission: RE | Disposition: A | Discharge status: Home / Self Care | Payer: Self-pay | Source: Ambulatory Visit | Attending: General Surgery

## 2018-04-08 ENCOUNTER — Other Ambulatory Visit: Payer: Self-pay

## 2018-04-08 DIAGNOSIS — G510 Bell's palsy: Secondary | ICD-10-CM | POA: Insufficient documentation

## 2018-04-08 DIAGNOSIS — I10 Essential (primary) hypertension: Secondary | ICD-10-CM | POA: Insufficient documentation

## 2018-04-08 DIAGNOSIS — Z8249 Family history of ischemic heart disease and other diseases of the circulatory system: Secondary | ICD-10-CM | POA: Insufficient documentation

## 2018-04-08 DIAGNOSIS — C773 Secondary and unspecified malignant neoplasm of axilla and upper limb lymph nodes: Secondary | ICD-10-CM | POA: Insufficient documentation

## 2018-04-08 DIAGNOSIS — C50411 Malignant neoplasm of upper-outer quadrant of right female breast: Secondary | ICD-10-CM | POA: Diagnosis not present

## 2018-04-08 DIAGNOSIS — Z888 Allergy status to other drugs, medicaments and biological substances status: Secondary | ICD-10-CM | POA: Insufficient documentation

## 2018-04-08 DIAGNOSIS — Z91040 Latex allergy status: Secondary | ICD-10-CM | POA: Insufficient documentation

## 2018-04-08 DIAGNOSIS — J9811 Atelectasis: Secondary | ICD-10-CM | POA: Diagnosis not present

## 2018-04-08 DIAGNOSIS — Z95828 Presence of other vascular implants and grafts: Secondary | ICD-10-CM

## 2018-04-08 DIAGNOSIS — Z419 Encounter for procedure for purposes other than remedying health state, unspecified: Secondary | ICD-10-CM

## 2018-04-08 DIAGNOSIS — Z8673 Personal history of transient ischemic attack (TIA), and cerebral infarction without residual deficits: Secondary | ICD-10-CM | POA: Insufficient documentation

## 2018-04-08 DIAGNOSIS — Z823 Family history of stroke: Secondary | ICD-10-CM | POA: Insufficient documentation

## 2018-04-08 DIAGNOSIS — Z833 Family history of diabetes mellitus: Secondary | ICD-10-CM | POA: Insufficient documentation

## 2018-04-08 DIAGNOSIS — Z853 Personal history of malignant neoplasm of breast: Secondary | ICD-10-CM | POA: Insufficient documentation

## 2018-04-08 DIAGNOSIS — Z170 Estrogen receptor positive status [ER+]: Secondary | ICD-10-CM | POA: Insufficient documentation

## 2018-04-08 DIAGNOSIS — Z7982 Long term (current) use of aspirin: Secondary | ICD-10-CM | POA: Insufficient documentation

## 2018-04-08 DIAGNOSIS — Z91048 Other nonmedicinal substance allergy status: Secondary | ICD-10-CM | POA: Insufficient documentation

## 2018-04-08 DIAGNOSIS — Z923 Personal history of irradiation: Secondary | ICD-10-CM | POA: Insufficient documentation

## 2018-04-08 DIAGNOSIS — Z801 Family history of malignant neoplasm of trachea, bronchus and lung: Secondary | ICD-10-CM | POA: Insufficient documentation

## 2018-04-08 DIAGNOSIS — Z9221 Personal history of antineoplastic chemotherapy: Secondary | ICD-10-CM | POA: Insufficient documentation

## 2018-04-08 DIAGNOSIS — Z452 Encounter for adjustment and management of vascular access device: Secondary | ICD-10-CM | POA: Diagnosis not present

## 2018-04-08 DIAGNOSIS — Z79899 Other long term (current) drug therapy: Secondary | ICD-10-CM | POA: Insufficient documentation

## 2018-04-08 DIAGNOSIS — N289 Disorder of kidney and ureter, unspecified: Secondary | ICD-10-CM | POA: Insufficient documentation

## 2018-04-08 HISTORY — PX: PORTACATH PLACEMENT: SHX2246

## 2018-04-08 LAB — POCT PREGNANCY, URINE: Preg Test, Ur: NEGATIVE

## 2018-04-08 SURGERY — INSERTION, TUNNELED CENTRAL VENOUS DEVICE, WITH PORT
Anesthesia: General | Site: Chest | Laterality: Left

## 2018-04-08 MED ORDER — HEPARIN SOD (PORK) LOCK FLUSH 100 UNIT/ML IV SOLN
INTRAVENOUS | Status: DC | PRN
  Administered 2018-04-08: 500 [IU] via INTRAVENOUS

## 2018-04-08 MED ORDER — BUPIVACAINE HCL (PF) 0.25 % IJ SOLN
INTRAMUSCULAR | Status: DC | PRN
  Administered 2018-04-08: 8 mL

## 2018-04-08 MED ORDER — FENTANYL CITRATE (PF) 250 MCG/5ML IJ SOLN
INTRAMUSCULAR | Status: DC | PRN
  Administered 2018-04-08 (×2): 50 ug via INTRAVENOUS

## 2018-04-08 MED ORDER — HEPARIN SOD (PORK) LOCK FLUSH 100 UNIT/ML IV SOLN
INTRAVENOUS | Status: AC
  Filled 2018-04-08: qty 5

## 2018-04-08 MED ORDER — FENTANYL CITRATE (PF) 100 MCG/2ML IJ SOLN: 25.0000 ug | INTRAMUSCULAR | Status: DC | PRN

## 2018-04-08 MED ORDER — CHLORHEXIDINE GLUCONATE CLOTH 2 % EX PADS: 6.0000 | MEDICATED_PAD | Freq: Once | CUTANEOUS | Status: DC

## 2018-04-08 MED ORDER — CELECOXIB 200 MG PO CAPS
ORAL_CAPSULE | ORAL | Status: AC
  Administered 2018-04-08: 200 mg via ORAL
  Filled 2018-04-08: qty 1

## 2018-04-08 MED ORDER — LACTATED RINGERS IV SOLN: INTRAVENOUS | Status: DC

## 2018-04-08 MED ORDER — HYDROCODONE-ACETAMINOPHEN 5-325 MG PO TABS: 1.0000 | ORAL_TABLET | Freq: Four times a day (QID) | ORAL | 0 refills | Status: DC | PRN

## 2018-04-08 MED ORDER — ONDANSETRON HCL 4 MG/2ML IJ SOLN
INTRAMUSCULAR | Status: AC
  Filled 2018-04-08: qty 2

## 2018-04-08 MED ORDER — PROPOFOL 10 MG/ML IV BOLUS
INTRAVENOUS | Status: AC
  Filled 2018-04-08: qty 20

## 2018-04-08 MED ORDER — BUPIVACAINE HCL (PF) 0.25 % IJ SOLN
INTRAMUSCULAR | Status: AC
  Filled 2018-04-08: qty 30

## 2018-04-08 MED ORDER — ONDANSETRON HCL 4 MG/2ML IJ SOLN
INTRAMUSCULAR | Status: DC | PRN
  Administered 2018-04-08: 4 mg via INTRAVENOUS

## 2018-04-08 MED ORDER — CEFAZOLIN SODIUM-DEXTROSE 2-4 GM/100ML-% IV SOLN
2.0000 g | INTRAVENOUS | Status: AC
  Administered 2018-04-08: 2 g via INTRAVENOUS

## 2018-04-08 MED ORDER — ACETAMINOPHEN 500 MG PO TABS
ORAL_TABLET | ORAL | Status: AC
  Administered 2018-04-08: 1000 mg via ORAL
  Filled 2018-04-08: qty 2

## 2018-04-08 MED ORDER — CEFAZOLIN SODIUM-DEXTROSE 2-4 GM/100ML-% IV SOLN
INTRAVENOUS | Status: AC
  Filled 2018-04-08: qty 100

## 2018-04-08 MED ORDER — SODIUM CHLORIDE 0.9 % IV SOLN
INTRAVENOUS | Status: DC | PRN
  Administered 2018-04-08: 1000 mL

## 2018-04-08 MED ORDER — MIDAZOLAM HCL 2 MG/2ML IJ SOLN
INTRAMUSCULAR | Status: AC
  Filled 2018-04-08: qty 2

## 2018-04-08 MED ORDER — GABAPENTIN 300 MG PO CAPS
300.0000 mg | ORAL_CAPSULE | ORAL | Status: AC
  Administered 2018-04-08: 300 mg via ORAL

## 2018-04-08 MED ORDER — FENTANYL CITRATE (PF) 250 MCG/5ML IJ SOLN
INTRAMUSCULAR | Status: AC
  Filled 2018-04-08: qty 5

## 2018-04-08 MED ORDER — METOCLOPRAMIDE HCL 5 MG/ML IJ SOLN: 10.0000 mg | Freq: Once | INTRAMUSCULAR | Status: DC | PRN

## 2018-04-08 MED ORDER — GABAPENTIN 300 MG PO CAPS
ORAL_CAPSULE | ORAL | Status: AC
  Administered 2018-04-08: 300 mg via ORAL
  Filled 2018-04-08: qty 1

## 2018-04-08 MED ORDER — SODIUM CHLORIDE 0.9 % IV SOLN
INTRAVENOUS | Status: AC
  Filled 2018-04-08: qty 1.2

## 2018-04-08 MED ORDER — CELECOXIB 200 MG PO CAPS
200.0000 mg | ORAL_CAPSULE | ORAL | Status: AC
  Administered 2018-04-08: 200 mg via ORAL

## 2018-04-08 MED ORDER — LIDOCAINE 2% (20 MG/ML) 5 ML SYRINGE
INTRAMUSCULAR | Status: AC
  Filled 2018-04-08: qty 5

## 2018-04-08 MED ORDER — MIDAZOLAM HCL 2 MG/2ML IJ SOLN
INTRAMUSCULAR | Status: DC | PRN
  Administered 2018-04-08: 2 mg via INTRAVENOUS

## 2018-04-08 MED ORDER — ACETAMINOPHEN 500 MG PO TABS
1000.0000 mg | ORAL_TABLET | ORAL | Status: AC
  Administered 2018-04-08: 1000 mg via ORAL

## 2018-04-08 MED ORDER — PROPOFOL 10 MG/ML IV BOLUS
INTRAVENOUS | Status: DC | PRN
  Administered 2018-04-08: 50 mg via INTRAVENOUS
  Administered 2018-04-08: 150 mg via INTRAVENOUS

## 2018-04-08 MED ORDER — LACTATED RINGERS IV SOLN
INTRAVENOUS | Status: DC | PRN
Start: 2018-04-08 — End: 2018-04-08
  Administered 2018-04-08: 08:00:00 via INTRAVENOUS

## 2018-04-08 MED ORDER — MEPERIDINE HCL 50 MG/ML IJ SOLN: 6.2500 mg | INTRAMUSCULAR | Status: DC | PRN

## 2018-04-08 MED ORDER — 0.9 % SODIUM CHLORIDE (POUR BTL) OPTIME
TOPICAL | Status: DC | PRN
  Administered 2018-04-08: 1000 mL

## 2018-04-08 MED ORDER — LIDOCAINE 2% (20 MG/ML) 5 ML SYRINGE
INTRAMUSCULAR | Status: DC | PRN
  Administered 2018-04-08: 100 mg via INTRAVENOUS

## 2018-04-08 SURGICAL SUPPLY — 45 items
BAG DECANTER FOR FLEXI CONT (MISCELLANEOUS) ×2 IMPLANT
BLADE SURG 15 STRL LF DISP TIS (BLADE) ×1 IMPLANT
BLADE SURG 15 STRL SS (BLADE) ×1
CHLORAPREP W/TINT 10.5 ML (MISCELLANEOUS) ×2 IMPLANT
COVER SURGICAL LIGHT HANDLE (MISCELLANEOUS) ×2 IMPLANT
COVER TRANSDUCER ULTRASND GEL (DRAPE) IMPLANT
CRADLE DONUT ADULT HEAD (MISCELLANEOUS) ×2 IMPLANT
DERMABOND ADVANCED (GAUZE/BANDAGES/DRESSINGS) ×1
DERMABOND ADVANCED .7 DNX12 (GAUZE/BANDAGES/DRESSINGS) ×1 IMPLANT
DRAPE C-ARM 42X72 X-RAY (DRAPES) ×2 IMPLANT
DRAPE CHEST BREAST 15X10 FENES (DRAPES) ×2 IMPLANT
DRAPE UTILITY XL STRL (DRAPES) ×2 IMPLANT
ELECT CAUTERY BLADE 6.4 (BLADE) ×2 IMPLANT
ELECT REM PT RETURN 9FT ADLT (ELECTROSURGICAL) ×2
ELECTRODE REM PT RTRN 9FT ADLT (ELECTROSURGICAL) ×1 IMPLANT
GAUZE SPONGE 4X4 16PLY XRAY LF (GAUZE/BANDAGES/DRESSINGS) ×2 IMPLANT
GLOVE BIOGEL PI IND STRL 7.0 (GLOVE) ×1 IMPLANT
GLOVE BIOGEL PI INDICATOR 7.0 (GLOVE) ×1
GLOVE SURG SS PI 7.0 STRL IVOR (GLOVE) ×2 IMPLANT
GLOVE SURG SS PI 7.5 STRL IVOR (GLOVE) ×6 IMPLANT
GOWN STRL REUS W/ TWL LRG LVL3 (GOWN DISPOSABLE) ×2 IMPLANT
GOWN STRL REUS W/TWL LRG LVL3 (GOWN DISPOSABLE) ×2
INTRODUCER COOK 11FR (CATHETERS) IMPLANT
KIT BASIN OR (CUSTOM PROCEDURE TRAY) ×2 IMPLANT
KIT PORT POWER 8FR ISP CVUE (Port) ×2 IMPLANT
KIT TURNOVER KIT B (KITS) ×2 IMPLANT
NEEDLE 22X1 1/2 (OR ONLY) (NEEDLE) IMPLANT
NEEDLE HYPO 25GX1X1/2 BEV (NEEDLE) ×2 IMPLANT
NS IRRIG 1000ML POUR BTL (IV SOLUTION) ×2 IMPLANT
PACK SURGICAL SETUP 50X90 (CUSTOM PROCEDURE TRAY) ×2 IMPLANT
PAD ARMBOARD 7.5X6 YLW CONV (MISCELLANEOUS) ×2 IMPLANT
PENCIL BUTTON HOLSTER BLD 10FT (ELECTRODE) ×2 IMPLANT
SET INTRODUCER 12FR PACEMAKER (INTRODUCER) IMPLANT
SET SHEATH INTRODUCER 10FR (MISCELLANEOUS) IMPLANT
SHEATH COOK PEEL AWAY SET 9F (SHEATH) IMPLANT
SUT MNCRL AB 4-0 PS2 18 (SUTURE) ×4 IMPLANT
SUT PROLENE 2 0 SH 30 (SUTURE) ×2 IMPLANT
SUT SILK 2 0 (SUTURE)
SUT SILK 2-0 18XBRD TIE 12 (SUTURE) IMPLANT
SUT VIC AB 3-0 SH 27 (SUTURE) ×1
SUT VIC AB 3-0 SH 27XBRD (SUTURE) ×1 IMPLANT
SYR 20ML ECCENTRIC (SYRINGE) ×4 IMPLANT
SYR 5ML LUER SLIP (SYRINGE) ×2 IMPLANT
SYR CONTROL 10ML LL (SYRINGE) ×4 IMPLANT
TOWEL OR 17X24 6PK STRL BLUE (TOWEL DISPOSABLE) ×2 IMPLANT

## 2018-04-08 NOTE — H&P (Signed)
[name] [name]  Location: [facility] Patient #: [id number] DOB: Jul 03, 1971 Undefined / Language: [name] [name] / Race: Black or African American Female   History of Present Illness  The patient is a 47 year old female who presents with breast cancer. We are asked to see the patient in consultation by Dr. Lisbeth Renshaw to evaluate her for a new right breast cancer. The patient is a 47 year old black female who presents with a palpable mass in the upper outer right breast. It has been present since at least february. she does not smoke and has no family history. There were 2 masses found measuring 4.6 and 1.2cm with at least 6 abnormal lymph nodes. 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 She has a history of left breast cancer treated with lumpectomy, chemo, and radiation in 2000 in [place]   Past Surgical History Breast Biopsy  Right. Breast Mass; Local Excision  Left. Mastectomy  Left. Oral Surgery   Diagnostic Studies History  Colonoscopy  never Mammogram  within last year Pap Smear  1-5 years ago  Medication History  Medications Reconciled  Social History Caffeine use  Carbonated beverages, Coffee, Tea. No alcohol use  No drug use  Tobacco use  Never smoker.  Family History Family history unknown  First Degree Relatives   Pregnancy / Birth History Age at menarche  73 years. Contraceptive History  Contraceptive implant, Oral contraceptives. Gravida  3 Maternal age  53-25 Para  3 Regular periods   Other Problems  Breast Cancer  Cerebrovascular Accident  High blood pressure  Lump In Breast     Review of Systems General Present- Night Sweats and Weight Gain. Not Present- Appetite Loss, Chills, Fatigue, Fever and Weight Loss. Skin Not Present- Change in Wart/Mole, Dryness, Hives, Jaundice, New Lesions, Non-Healing Wounds, Rash and Ulcer. HEENT Present- Wears glasses/contact lenses.  Not Present- Earache, Hearing Loss, Hoarseness, Nose Bleed, Oral Ulcers, Ringing in the Ears, Seasonal Allergies, Sinus Pain, Sore Throat, Visual Disturbances and Yellow Eyes. Respiratory Present- Snoring. Not Present- Bloody sputum, Chronic Cough, Difficulty Breathing and Wheezing. Breast Present- Breast Pain. Not Present- Breast Mass, Nipple Discharge and Skin Changes. Cardiovascular Not Present- Chest Pain, Difficulty Breathing Lying Down, Leg Cramps, Palpitations, Rapid Heart Rate, Shortness of Breath and Swelling of Extremities. Gastrointestinal Not Present- Abdominal Pain, Bloating, Bloody Stool, Change in Bowel Habits, Chronic diarrhea, Constipation, Difficulty Swallowing, Excessive gas, Gets full quickly at meals, Hemorrhoids, Indigestion, Nausea, Rectal Pain and Vomiting. Female Genitourinary Not Present- Frequency, Nocturia, Painful Urination, Pelvic Pain and Urgency. Musculoskeletal Not Present- Back Pain, Joint Pain, Joint Stiffness, Muscle Pain, Muscle Weakness and Swelling of Extremities. Neurological Not Present- Decreased Memory, Fainting, Headaches, Numbness, Seizures, Tingling, Tremor, Trouble walking and Weakness. Psychiatric Not Present- Anxiety, Bipolar, Change in Sleep Pattern, Depression, Fearful and Frequent crying. Endocrine Not Present- Cold Intolerance, Excessive Hunger, Hair Changes, Heat Intolerance, Hot flashes and New Diabetes. Hematology Not Present- Blood Thinners, Easy Bruising, Excessive bleeding, Gland problems, HIV and Persistent Infections.   Physical Exam  General Mental Status-Alert. General Appearance-Consistent with stated age. Hydration-Well hydrated. Voice-Normal.  Head and Neck Head-normocephalic, atraumatic with no lesions or palpable masses. Trachea-midline. Thyroid Gland Characteristics - normal size and consistency.  Eye Eyeball - Bilateral-Extraocular movements intact. Sclera/Conjunctiva - Bilateral-No scleral  icterus.  Chest and Lung Exam Chest and lung exam reveals -quiet, even and easy respiratory effort with no use of accessory muscles and  on auscultation, normal breath sounds, no adventitious sounds and normal vocal resonance. Inspection Chest Wall - Normal. Back - normal.  Breast Note: There is a palpable 6cm mass in the UOQ of right breast. It is mobile with no skin changes. There are palpable enlarged lymph nodes in the right axillary but not overly bulky. Left breast is contracted from previous lumpectomy and radiation   Cardiovascular Cardiovascular examination reveals -normal heart sounds, regular rate and rhythm with no murmurs and normal pedal pulses bilaterally.  Abdomen Inspection Inspection of the abdomen reveals - No Hernias. Skin - Scar - no surgical scars. Palpation/Percussion Palpation and Percussion of the abdomen reveal - Soft, Non Tender, No Rebound tenderness, No Rigidity (guarding) and No hepatosplenomegaly. Auscultation Auscultation of the abdomen reveals - Bowel sounds normal.  Neurologic Neurologic evaluation reveals -alert and oriented x 3 with no impairment of recent or remote memory. Mental Status-Normal.  Musculoskeletal Normal Exam - Left-Upper Extremity Strength Normal and Lower Extremity Strength Normal. Normal Exam - Right-Upper Extremity Strength Normal and Lower Extremity Strength Normal.  Lymphatic Head & Neck  General Head & Neck Lymphatics: Bilateral - Description - Normal. Axillary  General Axillary Region: Bilateral - Description - Normal. Tenderness - Non Tender. Femoral & Inguinal  Generalized Femoral & Inguinal Lymphatics: Bilateral - Description - Normal. Tenderness - Non Tender.    Assessment & Plan  MALIGNANT NEOPLASM OF UPPER-OUTER QUADRANT OF RIGHT BREAST IN FEMALE, ESTROGEN RECEPTOR POSITIVE (C50.411) Impression: The patient has a large cancer in the UOQ of right breast with + lymph nodes and is Her2 +. Because of  this I think she will benefit from neoadjuvant chemotherapy and will need a port. I have discussed with her the risks and benefits of the surgery as well as some of the technical aspects and she understands and wishes to proceed. Current Plans Follow up with [place] in the office in 2 months.  Call us sooner as needed.

## 2018-04-08 NOTE — Op Note (Signed)
04/08/2018  9:18 AM  PATIENT:  [name] [name]  47 y.o. female  PRE-OPERATIVE DIAGNOSIS:  RIGHT BREAST CANCER  POST-OPERATIVE DIAGNOSIS:  RIGHT BREAST CANCER  PROCEDURE:  Procedure(s): INSERTION PORT-A-CATH (Left)  SURGEON:  Surgeon(s) and Role:    * Jovita Kussmaul, MD - Primary  PHYSICIAN ASSISTANT:   ASSISTANTS: none   ANESTHESIA:   local and general  EBL:  minimal   BLOOD ADMINISTERED:none  DRAINS: none   LOCAL MEDICATIONS USED:  MARCAINE     SPECIMEN:  No Specimen  DISPOSITION OF SPECIMEN:  N/A  COUNTS:  YES  TOURNIQUET:  * No tourniquets in log *  DICTATION: .Dragon Dictation   After informed consent was obtained the patient was brought to the operating room and placed in the supine position on the operating table.  After adequate induction of general anesthesia roll was placed between the patient's shoulder blades to extend the shoulder slightly.  The left chest wall and neck area were then prepped with ChloraPrep, allowed to dry, and draped in usual sterile manner.  An appropriate timeout was performed.  The patient was placed in Trendelenburg position.  The area lateral to the bend of the clavicle and the left chest wall was infiltrated with quarter percent Marcaine.  A large bore needle from the Port-A-Cath kit was used to slide beneath the bend of the clavicle heading towards the sternal notch and in doing so was able to access the left subclavian vein without difficulty.  A wire was fed through the needle using the Seldinger technique without difficulty.  The wire was confirmed in the central venous system using real-time fluoroscopy.  Next a small incision was made at the wire entry site on the left chest wall with a 15 blade knife.  The incision was carried through the skin and subcutaneous tissue sharply with electrocautery.  A subcutaneous pocket was then created inferior to the incision by blunt finger dissection.  The tubing was then placed on the reservoir.  The  reservoir was placed in the pocket and the length of the tubing was again estimated using real-time fluoroscopy.  The tubing was cut to the appropriate length.  Next a sheath and dilator were fed over the wire using the Seldinger technique without difficulty.  The wire and dilator were removed.  The tubing was fed through the sheath as far as it would go and then held in place while the sheath was gently cracked and separated.  Another real-time fluoroscopy image showed the tip of the catheter to be in the distal superior vena cava.  The tubing was then permanently anchored to the reservoir.  The port was then anchored in its pocket with two 2-0 Prolene stitches.  The port was then aspirated and it aspirated blood easily.  The port was then flushed initially with a dilute heparin solution and then with a more concentrated heparin solution.  The subcutaneous tissue was then closed over the port with interrupted 3-0 Vicryl stitches.  The skin was then closed with a running 4-0 Monocryl subcuticular stitch.  Dermabond dressings were applied.  The patient tolerated the procedure well.  At the end of the case all needle sponge and instrument counts were correct.  The patient was then awakened and taken to recovery in stable condition.  PLAN OF CARE: Discharge to home after PACU  PATIENT DISPOSITION:  PACU - hemodynamically stable.   Delay start of Pharmacological VTE agent (>24hrs) due to surgical blood loss or risk of bleeding: not  applicable

## 2018-04-08 NOTE — Anesthesia Preprocedure Evaluation (Signed)
Anesthesia Evaluation  Patient identified by MRN, date of birth, ID band Patient awake    Reviewed: Allergy & Precautions, NPO status , Patient's Chart, lab work & pertinent test results  Airway Mallampati: II  TM Distance: >3 FB Neck ROM: Full    Dental no notable dental hx.    Pulmonary neg pulmonary ROS,    Pulmonary exam normal breath sounds clear to auscultation       Cardiovascular hypertension, Pt. on medications Normal cardiovascular exam Rhythm:Regular Rate:Normal     Neuro/Psych TIAnegative psych ROS   GI/Hepatic negative GI ROS, Neg liver ROS,   Endo/Other  negative endocrine ROS  Renal/GU negative Renal ROS  negative genitourinary   Musculoskeletal negative musculoskeletal ROS (+)   Abdominal   Peds negative pediatric ROS (+)  Hematology negative hematology ROS (+)   Anesthesia Other Findings   Reproductive/Obstetrics negative OB ROS                             Anesthesia Physical Anesthesia Plan  ASA: II  Anesthesia Plan: General   Post-op Pain Management:    Induction: Intravenous  PONV Risk Score and Plan: 3 and Ondansetron, Midazolam and Treatment may vary due to age or medical condition  Airway Management Planned: LMA  Additional Equipment:   Intra-op Plan:   Post-operative Plan: Extubation in OR  Informed Consent: I have reviewed the patients History and Physical, chart, labs and discussed the procedure including the risks, benefits and alternatives for the proposed anesthesia with the patient or authorized representative who has indicated his/her understanding and acceptance.   Dental advisory given  Plan Discussed with: CRNA  Anesthesia Plan Comments:         Anesthesia Quick Evaluation

## 2018-04-08 NOTE — Progress Notes (Signed)
[name] [name] has a lytic lesion at T6 which possibly could be malignant.  This will need to be evaluated initially with an MRI and then if suspicious, with biopsy.

## 2018-04-08 NOTE — Interval H&P Note (Signed)
History and Physical Interval Note:  04/08/2018 8:25 AM  [name] [name]  has presented today for surgery, with the diagnosis of RIGHT BREAST CANCER  The various methods of treatment have been discussed with the patient and family. After consideration of risks, benefits and other options for treatment, the patient has consented to  Procedure(s): INSERTION PORT-A-CATH (N/A) as a surgical intervention .  The patient's history has been reviewed, patient examined, no change in status, stable for surgery.  I have reviewed the patient's chart and labs.  Questions were answered to the patient's satisfaction.     TOTH III,Candis Kabel S

## 2018-04-08 NOTE — Anesthesia Procedure Notes (Signed)
Procedure Name: LMA Insertion Date/Time: 04/08/2018 8:42 AM Performed by: Bryson Corona, CRNA Pre-anesthesia Checklist: Patient identified, Emergency Drugs available, Suction available and Patient being monitored Patient Re-evaluated:Patient Re-evaluated prior to induction Oxygen Delivery Method: Circle System Utilized Preoxygenation: Pre-oxygenation with 100% oxygen Induction Type: IV induction Ventilation: Mask ventilation without difficulty LMA: LMA inserted LMA Size: 4.0 Number of attempts: 1 Placement Confirmation: positive ETCO2 Tube secured with: Tape Dental Injury: Teeth and Oropharynx as per pre-operative assessment

## 2018-04-08 NOTE — Transfer of Care (Signed)
Immediate Anesthesia Transfer of Care Note  Patient: [name] [name]  Procedure(s) Performed: INSERTION PORT-A-CATH (Left Chest)  Patient Location: PACU  Anesthesia Type:General  Level of Consciousness: drowsy  Airway & Oxygen Therapy: Patient Spontanous Breathing and Patient connected to face mask oxygen  Post-op Assessment: Report given to RN and Post -op Vital signs reviewed and stable  Post vital signs: Reviewed and stable  Last Vitals:  Vitals Value Taken Time  BP    Temp    Pulse    Resp    SpO2      Last Pain:  Vitals:   04/08/18 0716  TempSrc:   PainSc: 0-No pain      Patients Stated Pain Goal: 3 (16/10/96 0454)  Complications: No apparent anesthesia complications

## 2018-04-09 ENCOUNTER — Encounter (HOSPITAL_COMMUNITY): Payer: Self-pay | Admitting: General Surgery

## 2018-04-09 ENCOUNTER — Encounter: Payer: Self-pay | Admitting: Oncology

## 2018-04-09 NOTE — Progress Notes (Signed)
Patient called to inform me her OOP has not quite been yet and may need copay assistance.  Advised patient I will enroll her in [place] for Neulasta kit copay assistance as well as [employer] for Herceptin and Perjeta. Patient verbalized understanding. Also, advised patient to bring proof of income for year on 04/16/18 to apply for [employer].  Enrolled patient in [place] for Neulasta. Patient approved for up to $10,000 covering the first injection at 100% and each additional would only leave her with a $5 copay after insurance pays.  Enrolled patient in [place] for Herceptin. Patient approved for $25,000 per 12 month eligibility period only leaving her with a $5 copay after insurance pays.  Enrolled patient in [place]. Patient approved for $25,000 per 12 month eligibility period only leaving her with a $5 copay after insurance pays.

## 2018-04-09 NOTE — Anesthesia Postprocedure Evaluation (Addendum)
Anesthesia Post Note  Patient: [name] [name]  Procedure(s) Performed: INSERTION PORT-A-CATH (Left Chest)     Patient location during evaluation: PACU Anesthesia Type: General Level of consciousness: awake and alert Pain management: pain level controlled Vital Signs Assessment: post-procedure vital signs reviewed and stable Respiratory status: spontaneous breathing, nonlabored ventilation, respiratory function stable and patient connected to nasal cannula oxygen Cardiovascular status: blood pressure returned to baseline and stable Postop Assessment: no apparent nausea or vomiting Anesthetic complications: no    Last Vitals:  Vitals:   04/08/18 1033 04/08/18 1040  BP:  (!) 160/77  Pulse: 68 70  Resp: (!) 28 20  Temp: 36.4 C   SpO2: 94% 94%    Last Pain:  Vitals:   04/08/18 1040  TempSrc:   PainSc: 0-No pain                 Montez Hageman

## 2018-04-13 ENCOUNTER — Other Ambulatory Visit: Payer: Self-pay | Admitting: Oncology

## 2018-04-13 ENCOUNTER — Ambulatory Visit (HOSPITAL_COMMUNITY)
Admission: RE | Admit: 2018-04-13 | Discharge: 2018-04-13 | Disposition: A | Discharge status: Home / Self Care | Payer: 59 | Source: Ambulatory Visit | Attending: Oncology | Admitting: Oncology

## 2018-04-13 DIAGNOSIS — C50411 Malignant neoplasm of upper-outer quadrant of right female breast: Secondary | ICD-10-CM

## 2018-04-13 DIAGNOSIS — Z170 Estrogen receptor positive status [ER+]: Secondary | ICD-10-CM

## 2018-04-14 ENCOUNTER — Inpatient Hospital Stay: Payer: 59 | Attending: Oncology | Admitting: Oncology

## 2018-04-14 VITALS — BP 142/84 | HR 62 | Temp 99.3°F | Resp 18 | Ht 59.0 in | Wt 216.7 lb

## 2018-04-14 DIAGNOSIS — C50811 Malignant neoplasm of overlapping sites of right female breast: Secondary | ICD-10-CM | POA: Diagnosis not present

## 2018-04-14 DIAGNOSIS — I10 Essential (primary) hypertension: Secondary | ICD-10-CM | POA: Diagnosis not present

## 2018-04-14 DIAGNOSIS — C50411 Malignant neoplasm of upper-outer quadrant of right female breast: Secondary | ICD-10-CM | POA: Diagnosis not present

## 2018-04-14 DIAGNOSIS — Z801 Family history of malignant neoplasm of trachea, bronchus and lung: Secondary | ICD-10-CM | POA: Diagnosis not present

## 2018-04-14 DIAGNOSIS — Z170 Estrogen receptor positive status [ER+]: Secondary | ICD-10-CM | POA: Diagnosis not present

## 2018-04-14 DIAGNOSIS — Z853 Personal history of malignant neoplasm of breast: Secondary | ICD-10-CM

## 2018-04-14 DIAGNOSIS — Z5111 Encounter for antineoplastic chemotherapy: Secondary | ICD-10-CM | POA: Insufficient documentation

## 2018-04-14 MED ORDER — DEXAMETHASONE 4 MG PO TABS: 8.0000 mg | ORAL_TABLET | Freq: Two times a day (BID) | ORAL | 1 refills | Status: DC

## 2018-04-14 MED ORDER — LORAZEPAM 0.5 MG PO TABS: 0.5000 mg | ORAL_TABLET | Freq: Every evening | ORAL | 0 refills | Status: DC | PRN

## 2018-04-14 MED ORDER — PROCHLORPERAZINE MALEATE 10 MG PO TABS: 10.0000 mg | ORAL_TABLET | Freq: Four times a day (QID) | ORAL | 1 refills | Status: DC | PRN

## 2018-04-14 MED ORDER — LIDOCAINE-PRILOCAINE 2.5-2.5 % EX CREA: TOPICAL_CREAM | CUTANEOUS | 3 refills | Status: DC

## 2018-04-16 ENCOUNTER — Inpatient Hospital Stay: Payer: 59

## 2018-04-16 ENCOUNTER — Encounter: Payer: Self-pay | Admitting: *Deleted

## 2018-04-16 VITALS — BP 109/81 | HR 68 | Temp 98.4°F | Resp 16

## 2018-04-16 DIAGNOSIS — Z170 Estrogen receptor positive status [ER+]: Secondary | ICD-10-CM

## 2018-04-16 DIAGNOSIS — C50411 Malignant neoplasm of upper-outer quadrant of right female breast: Secondary | ICD-10-CM

## 2018-04-16 DIAGNOSIS — Z5111 Encounter for antineoplastic chemotherapy: Secondary | ICD-10-CM | POA: Diagnosis not present

## 2018-04-16 LAB — COMPREHENSIVE METABOLIC PANEL
ALT: 23 U/L (ref 0–55)
AST: 18 U/L (ref 5–34)
Albumin: 3.8 g/dL (ref 3.5–5.0)
Alkaline Phosphatase: 96 U/L (ref 40–150)
Anion gap: 10 (ref 3–11)
BUN: 30 mg/dL — ABNORMAL HIGH (ref 7–26)
CO2: 21 mmol/L — ABNORMAL LOW (ref 22–29)
Calcium: 11.2 mg/dL — ABNORMAL HIGH (ref 8.4–10.4)
Chloride: 102 mmol/L (ref 98–109)
Creatinine, Ser: 1.26 mg/dL — ABNORMAL HIGH (ref 0.60–1.10)
GFR calc Af Amer: 58 mL/min — ABNORMAL LOW (ref >60)
GFR calc non Af Amer: 50 mL/min — ABNORMAL LOW (ref >60)
Glucose, Bld: 168 mg/dL — ABNORMAL HIGH (ref 70–140)
Potassium: 3.9 mmol/L (ref 3.5–5.1)
Sodium: 133 mmol/L — ABNORMAL LOW (ref 136–145)
Total Bilirubin: 0.2 mg/dL (ref 0.2–1.2)
Total Protein: 8.1 g/dL (ref 6.4–8.3)

## 2018-04-16 LAB — CBC WITH DIFFERENTIAL/PLATELET
Basophils Absolute: 0 10*3/uL (ref 0.0–0.1)
Basophils Relative: 0 %
Eosinophils Absolute: 0 10*3/uL (ref 0.0–0.5)
Eosinophils Relative: 0 %
HCT: 39.8 % (ref 34.8–46.6)
Hemoglobin: 13.2 g/dL (ref 11.6–15.9)
Lymphocytes Relative: 6 %
Lymphs Abs: 0.8 10*3/uL — ABNORMAL LOW (ref 0.9–3.3)
MCH: 27.3 pg (ref 25.1–34.0)
MCHC: 33.2 g/dL (ref 31.5–36.0)
MCV: 82.2 fL (ref 79.5–101.0)
Monocytes Absolute: 0.3 10*3/uL (ref 0.1–0.9)
Monocytes Relative: 2 %
Neutro Abs: 12.3 10*3/uL — ABNORMAL HIGH (ref 1.5–6.5)
Neutrophils Relative %: 92 %
Platelets: 182 10*3/uL (ref 145–400)
RBC: 4.84 MIL/uL (ref 3.70–5.45)
RDW: 15.8 % — ABNORMAL HIGH (ref 11.2–14.5)
WBC: 13.3 10*3/uL — ABNORMAL HIGH (ref 3.9–10.3)

## 2018-04-16 MED ORDER — PEGFILGRASTIM 6 MG/0.6ML ~~LOC~~ PSKT
PREFILLED_SYRINGE | SUBCUTANEOUS | Status: AC
  Filled 2018-04-16: qty 0.6

## 2018-04-16 MED ORDER — FOSAPREPITANT DIMEGLUMINE INJECTION 150 MG
Freq: Once | INTRAVENOUS | Status: AC
  Administered 2018-04-16: 13:00:00 via INTRAVENOUS
  Filled 2018-04-16: qty 5

## 2018-04-16 MED ORDER — SODIUM CHLORIDE 0.9 % IV SOLN
552.0000 mg | Freq: Once | INTRAVENOUS | Status: AC
  Administered 2018-04-16: 550 mg via INTRAVENOUS
  Filled 2018-04-16: qty 55

## 2018-04-16 MED ORDER — PALONOSETRON HCL INJECTION 0.25 MG/5ML
0.2500 mg | Freq: Once | INTRAVENOUS | Status: AC
  Administered 2018-04-16: 0.25 mg via INTRAVENOUS

## 2018-04-16 MED ORDER — DIPHENHYDRAMINE HCL 25 MG PO CAPS
25.0000 mg | ORAL_CAPSULE | Freq: Once | ORAL | Status: AC
  Administered 2018-04-16: 25 mg via ORAL

## 2018-04-16 MED ORDER — ACETAMINOPHEN 325 MG PO TABS
ORAL_TABLET | ORAL | Status: AC
  Filled 2018-04-16: qty 2

## 2018-04-16 MED ORDER — TRASTUZUMAB CHEMO 150 MG IV SOLR
8.0000 mg/kg | Freq: Once | INTRAVENOUS | Status: AC
  Administered 2018-04-16: 777 mg via INTRAVENOUS
  Filled 2018-04-16: qty 37

## 2018-04-16 MED ORDER — PEGFILGRASTIM 6 MG/0.6ML ~~LOC~~ PSKT
6.0000 mg | PREFILLED_SYRINGE | Freq: Once | SUBCUTANEOUS | Status: AC
  Administered 2018-04-16: 6 mg via SUBCUTANEOUS

## 2018-04-16 MED ORDER — DIPHENHYDRAMINE HCL 25 MG PO CAPS
ORAL_CAPSULE | ORAL | Status: AC
  Filled 2018-04-16: qty 1

## 2018-04-16 MED ORDER — HEPARIN SOD (PORK) LOCK FLUSH 100 UNIT/ML IV SOLN
500.0000 [IU] | Freq: Once | INTRAVENOUS | Status: AC | PRN
  Administered 2018-04-16: 500 [IU]
  Filled 2018-04-16: qty 5

## 2018-04-16 MED ORDER — PALONOSETRON HCL INJECTION 0.25 MG/5ML
INTRAVENOUS | Status: AC
  Filled 2018-04-16: qty 5

## 2018-04-16 MED ORDER — SODIUM CHLORIDE 0.9 % IV SOLN
Freq: Once | INTRAVENOUS | Status: AC
  Administered 2018-04-16: 10:00:00 via INTRAVENOUS

## 2018-04-16 MED ORDER — SODIUM CHLORIDE 0.9% FLUSH
10.0000 mL | INTRAVENOUS | Status: DC | PRN
  Administered 2018-04-16: 10 mL
  Filled 2018-04-16: qty 10

## 2018-04-16 MED ORDER — ACETAMINOPHEN 325 MG PO TABS
650.0000 mg | ORAL_TABLET | Freq: Once | ORAL | Status: AC
  Administered 2018-04-16: 650 mg via ORAL

## 2018-04-16 MED ORDER — SODIUM CHLORIDE 0.9 % IV SOLN
75.0000 mg/m2 | Freq: Once | INTRAVENOUS | Status: AC
  Administered 2018-04-16: 150 mg via INTRAVENOUS
  Filled 2018-04-16: qty 15

## 2018-04-16 MED ORDER — PERTUZUMAB CHEMO INJECTION 420 MG/14ML
420.0000 mg | Freq: Once | INTRAVENOUS | Status: AC
  Administered 2018-04-16: 420 mg via INTRAVENOUS
  Filled 2018-04-16: qty 14

## 2018-04-16 NOTE — Patient Instructions (Signed)
[facility] Discharge Instructions for Patients Receiving Chemotherapy  Today you received the following chemotherapy agents Herceptin, Perjeta, Taxol, Carbo  To help prevent nausea and vomiting after your treatment, we encourage you to take your nausea medication as directed by MD   If you develop nausea and vomiting that is not controlled by your nausea medication, call the clinic.   BELOW ARE SYMPTOMS THAT SHOULD BE REPORTED IMMEDIATELY:  *FEVER GREATER THAN 100.5 F  *CHILLS WITH OR WITHOUT FEVER  NAUSEA AND VOMITING THAT IS NOT CONTROLLED WITH YOUR NAUSEA MEDICATION  *UNUSUAL SHORTNESS OF BREATH  *UNUSUAL BRUISING OR BLEEDING  TENDERNESS IN MOUTH AND THROAT WITH OR WITHOUT PRESENCE OF ULCERS  *URINARY PROBLEMS  *BOWEL PROBLEMS  UNUSUAL RASH Items with * indicate a potential emergency and should be followed up as soon as possible.  Feel free to call the clinic should you have any questions or concerns. The clinic phone number is [phone].  Please show the [facility] at check-in to the Emergency Department and triage nurse.  (Herceptin) Trastuzumab injection for infusion What is this medicine? TRASTUZUMAB (tras TOO zoo mab) is a monoclonal antibody. It is used to treat breast cancer and stomach cancer. This medicine may be used for other purposes; ask your health care provider or pharmacist if you have questions. COMMON BRAND NAME(S): Herceptin What should I tell my health care provider before I take this medicine? They need to know if you have any of these conditions: -heart disease -heart failure -lung or breathing disease, like asthma -an unusual or allergic reaction to trastuzumab, benzyl alcohol, or other medications, foods, dyes, or preservatives -pregnant or trying to get pregnant -breast-feeding How should I use this medicine? This drug is given as an infusion into a vein. It is administered in a hospital or clinic by a specially  trained health care professional. Talk to your pediatrician regarding the use of this medicine in children. This medicine is not approved for use in children. Overdosage: If you think you have taken too much of this medicine contact a poison control center or emergency room at once. NOTE: This medicine is only for you. Do not share this medicine with others. What if I miss a dose? It is important not to miss a dose. Call your doctor or health care professional if you are unable to keep an appointment. What may interact with this medicine? This medicine may interact with the following medications: -certain types of chemotherapy, such as daunorubicin, doxorubicin, epirubicin, and idarubicin This list may not describe all possible interactions. Give your health care provider a list of all the medicines, herbs, non-prescription drugs, or dietary supplements you use. Also tell them if you smoke, drink alcohol, or use illegal drugs. Some items may interact with your medicine. What should I watch for while using this medicine? Visit your doctor for checks on your progress. Report any side effects. Continue your course of treatment even though you feel ill unless your doctor tells you to stop. Call your doctor or health care professional for advice if you get a fever, chills or sore throat, or other symptoms of a cold or flu. Do not treat yourself. Try to avoid being around people who are sick. You may experience fever, chills and shaking during your first infusion. These effects are usually mild and can be treated with other medicines. Report any side effects during the infusion to your health care professional. Fever and chills usually do not happen with later  infusions. Do not become pregnant while taking this medicine or for 7 months after stopping it. Women should inform their doctor if they wish to become pregnant or think they might be pregnant. Women of child-bearing potential will need to have a  negative pregnancy test before starting this medicine. There is a potential for serious side effects to an unborn child. Talk to your health care professional or pharmacist for more information. Do not breast-feed an infant while taking this medicine or for 7 months after stopping it. Women must use effective birth control with this medicine. What side effects may I notice from receiving this medicine? Side effects that you should report to your doctor or health care professional as soon as possible: -allergic reactions like skin rash, itching or hives, swelling of the face, lips, or tongue -chest pain or palpitations -cough -dizziness -feeling faint or lightheaded, falls -fever -general ill feeling or flu-like symptoms -signs of worsening heart failure like breathing problems; swelling in your legs and feet -unusually weak or tired Side effects that usually do not require medical attention (report to your doctor or health care professional if they continue or are bothersome): -bone pain -changes in taste -diarrhea -joint pain -nausea/vomiting -weight loss This list may not describe all possible side effects. Call your doctor for medical advice about side effects. You may report side effects to FDA at 1-800-FDA-1088. Where should I keep my medicine? This drug is given in a hospital or clinic and will not be stored at home. NOTE: This sheet is a summary. It may not cover all possible information. If you have questions about this medicine, talk to your doctor, pharmacist, or health care provider.  2018 Elsevier/Gold Standard (2016-11-12 14:37:52) (Perjeta)  Pertuzumab injection What is this medicine? PERTUZUMAB (per TOOZ ue mab) is a monoclonal antibody. It is used to treat breast cancer. This medicine may be used for other purposes; ask your health care provider or pharmacist if you have questions. COMMON BRAND NAME(S): PERJETA What should I tell my health care provider before I take  this medicine? They need to know if you have any of these conditions: -heart disease -heart failure -high blood pressure -history of irregular heart beat -recent or ongoing radiation therapy -an unusual or allergic reaction to pertuzumab, other medicines, foods, dyes, or preservatives -pregnant or trying to get pregnant -breast-feeding How should I use this medicine? This medicine is for infusion into a vein. It is given by a health care professional in a hospital or clinic setting. Talk to your pediatrician regarding the use of this medicine in children. Special care may be needed. Overdosage: If you think you have taken too much of this medicine contact a poison control center or emergency room at once. NOTE: This medicine is only for you. Do not share this medicine with others. What if I miss a dose? It is important not to miss your dose. Call your doctor or health care professional if you are unable to keep an appointment. What may interact with this medicine? Interactions are not expected. Give your health care provider a list of all the medicines, herbs, non-prescription drugs, or dietary supplements you use. Also tell them if you smoke, drink alcohol, or use illegal drugs. Some items may interact with your medicine. This list may not describe all possible interactions. Give your health care provider a list of all the medicines, herbs, non-prescription drugs, or dietary supplements you use. Also tell them if you smoke, drink alcohol, or use illegal drugs.  Some items may interact with your medicine. What should I watch for while using this medicine? Your condition will be monitored carefully while you are receiving this medicine. Report any side effects. Continue your course of treatment even though you feel ill unless your doctor tells you to stop. Do not become pregnant while taking this medicine or for 7 months after stopping it. Women should inform their doctor if they wish to become  pregnant or think they might be pregnant. Women of child-bearing potential will need to have a negative pregnancy test before starting this medicine. There is a potential for serious side effects to an unborn child. Talk to your health care professional or pharmacist for more information. Do not breast-feed an infant while taking this medicine or for 7 months after stopping it. Women must use effective birth control with this medicine. Call your doctor or health care professional for advice if you get a fever, chills or sore throat, or other symptoms of a cold or flu. Do not treat yourself. Try to avoid being around people who are sick. You may experience fever, chills, and headache during the infusion. Report any side effects during the infusion to your health care professional. What side effects may I notice from receiving this medicine? Side effects that you should report to your doctor or health care professional as soon as possible: -breathing problems -chest pain or palpitations -dizziness -feeling faint or lightheaded -fever or chills -skin rash, itching or hives -sore throat -swelling of the face, lips, or tongue -swelling of the legs or ankles -unusually weak or tired Side effects that usually do not require medical attention (report to your doctor or health care professional if they continue or are bothersome): -diarrhea -hair loss -nausea, vomiting -tiredness This list may not describe all possible side effects. Call your doctor for medical advice about side effects. You may report side effects to FDA at 1-800-FDA-1088. Where should I keep my medicine? This drug is given in a hospital or clinic and will not be stored at home. NOTE: This sheet is a summary. It may not cover all possible information. If you have questions about this medicine, talk to your doctor, pharmacist, or health care provider.  2018 Elsevier/Gold Standard (2015-12-21 12:08:50)  (Taxol)  Paclitaxel  injection What is this medicine? PACLITAXEL (PAK li TAX el) is a chemotherapy drug. It targets fast dividing cells, like cancer cells, and causes these cells to die. This medicine is used to treat ovarian cancer, breast cancer, and other cancers. This medicine may be used for other purposes; ask your health care provider or pharmacist if you have questions. COMMON BRAND NAME(S): Onxol, Taxol What should I tell my health care provider before I take this medicine? They need to know if you have any of these conditions: -blood disorders -irregular heartbeat -infection (especially a virus infection such as chickenpox, cold sores, or herpes) -liver disease -previous or ongoing radiation therapy -an unusual or allergic reaction to paclitaxel, alcohol, polyoxyethylated castor oil, other chemotherapy agents, other medicines, foods, dyes, or preservatives -pregnant or trying to get pregnant -breast-feeding How should I use this medicine? This drug is given as an infusion into a vein. It is administered in a hospital or clinic by a specially trained health care professional. Talk to your pediatrician regarding the use of this medicine in children. Special care may be needed. Overdosage: If you think you have taken too much of this medicine contact a poison control center or emergency room at once. NOTE:  This medicine is only for you. Do not share this medicine with others. What if I miss a dose? It is important not to miss your dose. Call your doctor or health care professional if you are unable to keep an appointment. What may interact with this medicine? Do not take this medicine with any of the following medications: -disulfiram -metronidazole This medicine may also interact with the following medications: -cyclosporine -diazepam -ketoconazole -medicines to increase blood counts like filgrastim, pegfilgrastim, sargramostim -other chemotherapy drugs like cisplatin, doxorubicin, epirubicin,  etoposide, teniposide, vincristine -quinidine -testosterone -vaccines -verapamil Talk to your doctor or health care professional before taking any of these medicines: -acetaminophen -aspirin -ibuprofen -ketoprofen -naproxen This list may not describe all possible interactions. Give your health care provider a list of all the medicines, herbs, non-prescription drugs, or dietary supplements you use. Also tell them if you smoke, drink alcohol, or use illegal drugs. Some items may interact with your medicine. What should I watch for while using this medicine? Your condition will be monitored carefully while you are receiving this medicine. You will need important blood work done while you are taking this medicine. This medicine can cause serious allergic reactions. To reduce your risk you will need to take other medicine(s) before treatment with this medicine. If you experience allergic reactions like skin rash, itching or hives, swelling of the face, lips, or tongue, tell your doctor or health care professional right away. In some cases, you may be given additional medicines to help with side effects. Follow all directions for their use. This drug may make you feel generally unwell. This is not uncommon, as chemotherapy can affect healthy cells as well as cancer cells. Report any side effects. Continue your course of treatment even though you feel ill unless your doctor tells you to stop. Call your doctor or health care professional for advice if you get a fever, chills or sore throat, or other symptoms of a cold or flu. Do not treat yourself. This drug decreases your body's ability to fight infections. Try to avoid being around people who are sick. This medicine may increase your risk to bruise or bleed. Call your doctor or health care professional if you notice any unusual bleeding. Be careful brushing and flossing your teeth or using a toothpick because you may get an infection or bleed more  easily. If you have any dental work done, tell your dentist you are receiving this medicine. Avoid taking products that contain aspirin, acetaminophen, ibuprofen, naproxen, or ketoprofen unless instructed by your doctor. These medicines may hide a fever. Do not become pregnant while taking this medicine. Women should inform their doctor if they wish to become pregnant or think they might be pregnant. There is a potential for serious side effects to an unborn child. Talk to your health care professional or pharmacist for more information. Do not breast-feed an infant while taking this medicine. Men are advised not to father a child while receiving this medicine. This product may contain alcohol. Ask your pharmacist or healthcare provider if this medicine contains alcohol. Be sure to tell all healthcare providers you are taking this medicine. Certain medicines, like metronidazole and disulfiram, can cause an unpleasant reaction when taken with alcohol. The reaction includes flushing, headache, nausea, vomiting, sweating, and increased thirst. The reaction can last from 30 minutes to several hours. What side effects may I notice from receiving this medicine? Side effects that you should report to your doctor or health care professional as soon as possible: -  allergic reactions like skin rash, itching or hives, swelling of the face, lips, or tongue -low blood counts - This drug may decrease the number of white blood cells, red blood cells and platelets. You may be at increased risk for infections and bleeding. -signs of infection - fever or chills, cough, sore throat, pain or difficulty passing urine -signs of decreased platelets or bleeding - bruising, pinpoint red spots on the skin, black, tarry stools, nosebleeds -signs of decreased red blood cells - unusually weak or tired, fainting spells, lightheadedness -breathing problems -chest pain -high or low blood pressure -mouth sores -nausea and  vomiting -pain, swelling, redness or irritation at the injection site -pain, tingling, numbness in the hands or feet -slow or irregular heartbeat -swelling of the ankle, feet, hands Side effects that usually do not require medical attention (report to your doctor or health care professional if they continue or are bothersome): -bone pain -complete hair loss including hair on your head, underarms, pubic hair, eyebrows, and eyelashes -changes in the color of fingernails -diarrhea -loosening of the fingernails -loss of appetite -muscle or joint pain -red flush to skin -sweating This list may not describe all possible side effects. Call your doctor for medical advice about side effects. You may report side effects to FDA at 1-800-FDA-1088. Where should I keep my medicine? This drug is given in a hospital or clinic and will not be stored at home. NOTE: This sheet is a summary. It may not cover all possible information. If you have questions about this medicine, talk to your doctor, pharmacist, or health care provider.  2018 Elsevier/Gold Standard (2015-09-19 19:58:00)  (Carbo)  Carboplatin injection What is this medicine? CARBOPLATIN (KAR boe pla tin) is a chemotherapy drug. It targets fast dividing cells, like cancer cells, and causes these cells to die. This medicine is used to treat ovarian cancer and many other cancers. This medicine may be used for other purposes; ask your health care provider or pharmacist if you have questions. COMMON BRAND NAME(S): Paraplatin What should I tell my health care provider before I take this medicine? They need to know if you have any of these conditions: -blood disorders -hearing problems -kidney disease -recent or ongoing radiation therapy -an unusual or allergic reaction to carboplatin, cisplatin, other chemotherapy, other medicines, foods, dyes, or preservatives -pregnant or trying to get pregnant -breast-feeding How should I use this  medicine? This drug is usually given as an infusion into a vein. It is administered in a hospital or clinic by a specially trained health care professional. Talk to your pediatrician regarding the use of this medicine in children. Special care may be needed. Overdosage: If you think you have taken too much of this medicine contact a poison control center or emergency room at once. NOTE: This medicine is only for you. Do not share this medicine with others. What if I miss a dose? It is important not to miss a dose. Call your doctor or health care professional if you are unable to keep an appointment. What may interact with this medicine? -medicines for seizures -medicines to increase blood counts like filgrastim, pegfilgrastim, sargramostim -some antibiotics like amikacin, gentamicin, neomycin, streptomycin, tobramycin -vaccines Talk to your doctor or health care professional before taking any of these medicines: -acetaminophen -aspirin -ibuprofen -ketoprofen -naproxen This list may not describe all possible interactions. Give your health care provider a list of all the medicines, herbs, non-prescription drugs, or dietary supplements you use. Also tell them if you smoke, drink alcohol,  or use illegal drugs. Some items may interact with your medicine. What should I watch for while using this medicine? Your condition will be monitored carefully while you are receiving this medicine. You will need important blood work done while you are taking this medicine. This drug may make you feel generally unwell. This is not uncommon, as chemotherapy can affect healthy cells as well as cancer cells. Report any side effects. Continue your course of treatment even though you feel ill unless your doctor tells you to stop. In some cases, you may be given additional medicines to help with side effects. Follow all directions for their use. Call your doctor or health care professional for advice if you get a  fever, chills or sore throat, or other symptoms of a cold or flu. Do not treat yourself. This drug decreases your body's ability to fight infections. Try to avoid being around people who are sick. This medicine may increase your risk to bruise or bleed. Call your doctor or health care professional if you notice any unusual bleeding. Be careful brushing and flossing your teeth or using a toothpick because you may get an infection or bleed more easily. If you have any dental work done, tell your dentist you are receiving this medicine. Avoid taking products that contain aspirin, acetaminophen, ibuprofen, naproxen, or ketoprofen unless instructed by your doctor. These medicines may hide a fever. Do not become pregnant while taking this medicine. Women should inform their doctor if they wish to become pregnant or think they might be pregnant. There is a potential for serious side effects to an unborn child. Talk to your health care professional or pharmacist for more information. Do not breast-feed an infant while taking this medicine. What side effects may I notice from receiving this medicine? Side effects that you should report to your doctor or health care professional as soon as possible: -allergic reactions like skin rash, itching or hives, swelling of the face, lips, or tongue -signs of infection - fever or chills, cough, sore throat, pain or difficulty passing urine -signs of decreased platelets or bleeding - bruising, pinpoint red spots on the skin, black, tarry stools, nosebleeds -signs of decreased red blood cells - unusually weak or tired, fainting spells, lightheadedness -breathing problems -changes in hearing -changes in vision -chest pain -high blood pressure -low blood counts - This drug may decrease the number of white blood cells, red blood cells and platelets. You may be at increased risk for infections and bleeding. -nausea and vomiting -pain, swelling, redness or irritation at the  injection site -pain, tingling, numbness in the hands or feet -problems with balance, talking, walking -trouble passing urine or change in the amount of urine Side effects that usually do not require medical attention (report to your doctor or health care professional if they continue or are bothersome): -hair loss -loss of appetite -metallic taste in the mouth or changes in taste This list may not describe all possible side effects. Call your doctor for medical advice about side effects. You may report side effects to FDA at 1-800-FDA-1088. Where should I keep my medicine? This drug is given in a hospital or clinic and will not be stored at home. NOTE: This sheet is a summary. It may not cover all possible information. If you have questions about this medicine, talk to your doctor, pharmacist, or health care provider.  2018 Elsevier/Gold Standard (2008-02-23 14:38:05)

## 2018-04-16 NOTE — Progress Notes (Signed)
Spoke with Carolynne Edouard, PharmD regarding treatment today. Per Melissa, ok to administer antiemetics between trastuzumab and pertuzumab.

## 2018-04-17 ENCOUNTER — Telehealth: Payer: Self-pay | Admitting: Oncology

## 2018-04-17 NOTE — Telephone Encounter (Signed)
Lab/fu 5/24 requested per 5/14 los already on schedule. Central radiology will call patient re scan.

## 2018-04-23 ENCOUNTER — Telehealth: Payer: Self-pay | Admitting: Oncology

## 2018-04-23 NOTE — Telephone Encounter (Signed)
Faxed medical records to [facility] with [employer] @ [phone]. Release ID# [id number]

## 2018-04-23 NOTE — Progress Notes (Signed)
No show

## 2018-04-24 ENCOUNTER — Encounter: Payer: Self-pay | Admitting: Oncology

## 2018-04-24 ENCOUNTER — Inpatient Hospital Stay (HOSPITAL_BASED_OUTPATIENT_CLINIC_OR_DEPARTMENT_OTHER): Payer: 59 | Admitting: Oncology

## 2018-04-24 ENCOUNTER — Inpatient Hospital Stay: Payer: 59

## 2018-04-24 DIAGNOSIS — Z170 Estrogen receptor positive status [ER+]: Secondary | ICD-10-CM

## 2018-04-24 DIAGNOSIS — C50411 Malignant neoplasm of upper-outer quadrant of right female breast: Secondary | ICD-10-CM

## 2018-05-04 NOTE — Progress Notes (Signed)
FMLA successfully faxed to [employer] at [phone]. Mailed copy to patient address on file.

## 2018-05-06 ENCOUNTER — Inpatient Hospital Stay: Payer: 59 | Attending: Genetic Counselor | Admitting: Genetics

## 2018-05-06 ENCOUNTER — Encounter: Payer: Self-pay | Admitting: Genetics

## 2018-05-06 ENCOUNTER — Inpatient Hospital Stay: Payer: 59

## 2018-05-06 DIAGNOSIS — C50811 Malignant neoplasm of overlapping sites of right female breast: Secondary | ICD-10-CM | POA: Insufficient documentation

## 2018-05-06 DIAGNOSIS — Z170 Estrogen receptor positive status [ER+]: Secondary | ICD-10-CM

## 2018-05-06 DIAGNOSIS — Z853 Personal history of malignant neoplasm of breast: Secondary | ICD-10-CM | POA: Diagnosis not present

## 2018-05-06 DIAGNOSIS — C50411 Malignant neoplasm of upper-outer quadrant of right female breast: Secondary | ICD-10-CM | POA: Diagnosis not present

## 2018-05-06 DIAGNOSIS — Z5112 Encounter for antineoplastic immunotherapy: Secondary | ICD-10-CM | POA: Insufficient documentation

## 2018-05-06 DIAGNOSIS — Z5111 Encounter for antineoplastic chemotherapy: Secondary | ICD-10-CM | POA: Diagnosis not present

## 2018-05-06 DIAGNOSIS — Z807 Family history of other malignant neoplasms of lymphoid, hematopoietic and related tissues: Secondary | ICD-10-CM

## 2018-05-06 DIAGNOSIS — Z801 Family history of malignant neoplasm of trachea, bronchus and lung: Secondary | ICD-10-CM | POA: Diagnosis not present

## 2018-05-06 HISTORY — DX: Personal history of malignant neoplasm of breast: Z85.3

## 2018-05-06 LAB — CBC WITH DIFFERENTIAL/PLATELET
Basophils Absolute: 0 10*3/uL (ref 0.0–0.1)
Basophils Relative: 1 %
Eosinophils Absolute: 0.2 10*3/uL (ref 0.0–0.5)
Eosinophils Relative: 4 %
HCT: 35.8 % (ref 34.8–46.6)
Hemoglobin: 11.6 g/dL (ref 11.6–15.9)
Lymphocytes Relative: 30 %
Lymphs Abs: 1.3 10*3/uL (ref 0.9–3.3)
MCH: 26.8 pg (ref 25.1–34.0)
MCHC: 32.3 g/dL (ref 31.5–36.0)
MCV: 83.1 fL (ref 79.5–101.0)
Monocytes Absolute: 0.8 10*3/uL (ref 0.1–0.9)
Monocytes Relative: 18 %
Neutro Abs: 2.1 10*3/uL (ref 1.5–6.5)
Neutrophils Relative %: 47 %
Platelets: 163 10*3/uL (ref 145–400)
RBC: 4.31 MIL/uL (ref 3.70–5.45)
RDW: 15.7 % — ABNORMAL HIGH (ref 11.2–14.5)
WBC: 4.4 10*3/uL (ref 3.9–10.3)

## 2018-05-06 LAB — COMPREHENSIVE METABOLIC PANEL
ALT: 87 U/L — ABNORMAL HIGH (ref 0–55)
AST: 31 U/L (ref 5–34)
Albumin: 3.6 g/dL (ref 3.5–5.0)
Alkaline Phosphatase: 150 U/L (ref 40–150)
Anion gap: 7 (ref 3–11)
BUN: 25 mg/dL (ref 7–26)
CO2: 27 mmol/L (ref 22–29)
Calcium: 10.5 mg/dL — ABNORMAL HIGH (ref 8.4–10.4)
Chloride: 103 mmol/L (ref 98–109)
Creatinine, Ser: 1.2 mg/dL — ABNORMAL HIGH (ref 0.60–1.10)
GFR calc Af Amer: >60 mL/min (ref >60)
GFR calc non Af Amer: 53 mL/min — ABNORMAL LOW (ref >60)
Glucose, Bld: 85 mg/dL (ref 70–140)
Potassium: 4.2 mmol/L (ref 3.5–5.1)
Sodium: 137 mmol/L (ref 136–145)
Total Bilirubin: 0.3 mg/dL (ref 0.2–1.2)
Total Protein: 6.9 g/dL (ref 6.4–8.3)

## 2018-05-06 NOTE — Progress Notes (Signed)
REFERRING PROVIDER: Chauncey Cruel, MD [address] [place], [place] [address]  PRIMARY PROVIDER:  Marda Stalker, PA-C  PRIMARY REASON FOR VISIT:  1. Malignant neoplasm of upper-outer quadrant of right breast in female, estrogen receptor positive ([place])   2. Family history of lymphoma   3. Family history of lung cancer   4. Personal history of breast cancer     HISTORY OF PRESENT ILLNESS:   [name] [name], a 47 y.o. female, was seen for a [facility] cancer genetics consultation at the request of Dr. Jana Hakim due to a personal history of breast cancer.  [name] [name] presents to clinic today to discuss the possibility of a hereditary predisposition to cancer, genetic testing, and to further clarify her future cancer risks, as well as potential cancer risks for family members.   In 2000, at the age of 24, [name] [name] was diagnosed with left breast cancer.  She had chemotherapy.  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  She is currently undergoing neoadjuvant chemotherapy that will be followed by surgery,  adjuvant radiation, antiestrogen.   CANCER HISTORY:    Malignant neoplasm of upper-outer quadrant of right breast in female, estrogen receptor positive ([place])   03/19/2018 Initial Diagnosis    Malignant neoplasm of upper-outer quadrant of right breast in female, estrogen receptor positive ([place])      04/15/2018 -  Chemotherapy    The patient had palonosetron (ALOXI) injection 0.25 mg, 0.25 mg, Intravenous,  Once, 1 of 6 cycles Administration: 0.25 mg (04/16/2018) pegfilgrastim (NEULASTA ONPRO KIT) injection 6 mg, 6 mg, Subcutaneous, Once, 1 of 6 cycles Administration: 6 mg (04/16/2018) trastuzumab (HERCEPTIN) 777 mg in sodium chloride 0.9 % 250 mL chemo infusion, 8 mg/kg = 777 mg, Intravenous,  Once, 1 of 6 cycles Administration: 777 mg (04/16/2018) CARBOplatin (PARAPLATIN) 550 mg in sodium chloride 0.9 % 250 mL chemo  infusion, 550 mg (101.8 % of original dose 542 mg), Intravenous,  Once, 1 of 6 cycles Dose modification:   (original dose 542 mg, Cycle 1) Administration: 550 mg (04/16/2018) DOCEtaxel (TAXOTERE) 150 mg in sodium chloride 0.9 % 250 mL chemo infusion, 75 mg/m2 = 150 mg, Intravenous,  Once, 1 of 6 cycles Administration: 150 mg (04/16/2018) pertuzumab (PERJETA) 420 mg in sodium chloride 0.9 % 250 mL chemo infusion, 420 mg (100 % of original dose 420 mg), Intravenous, Once, 1 of 6 cycles Dose modification: 420 mg (original dose 420 mg, Cycle 1, Reason: Provider Judgment) Administration: 420 mg (04/16/2018) fosaprepitant (EMEND) 150 mg, dexamethasone (DECADRON) 12 mg in sodium chloride 0.9 % 145 mL IVPB, , Intravenous,  Once, 1 of 6 cycles Administration:  (04/16/2018)  for chemotherapy treatment.         HORMONAL RISK FACTORS:  Menarche was at age 49.  First live birth at age 63.  OCP use for approximately 2 years. Used depo after these 2 years.  Ovaries intact: yes.  Hysterectomy: no.  Menopausal status: perimenopausal.  HRT use: 0 years. Colonoscopy: no; not examined. Mammogram within the last year: yes.   Past Medical History:  Diagnosis Date  . Anxiety   . Breast cancer, left breast ([place]) 2000   Underwent lumpectomy, chemotherapy and radiation  . Facial paralysis/Bells palsy    left side  . Family history of lung cancer   . Family history of lymphoma   . Hypertension   . Personal history of breast cancer 05/06/2018  .  Renal disorder    just after TIA acute kidney injury  . TIA (transient ischemic attack) 03/16/2017   [facility]    Past Surgical History:  Procedure Laterality Date  . BREAST LUMPECTOMY WITH AXILLARY LYMPH NODE BIOPSY Left 2000   biopsy  . PORTACATH PLACEMENT Left 04/08/2018   Procedure: INSERTION PORT-A-CATH;  Surgeon: Jovita Kussmaul, MD;  Location: [facility];  Service: General;  Laterality: Left;  . TUBAL LIGATION Bilateral 2004  . WISDOM TOOTH EXTRACTION       Social History   Socioeconomic History  . Marital status: Married    Spouse name: [name]  . Number of children: 3  . Years of education: 35  . 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 Financial resource strain: Not on file  . Food insecurity:    Worry: Not on file    Inability: Not on file  . Transportation needs:    Medical: Not on file    Non-medical: Not on file  Tobacco Use  . Smoking status: Never Smoker  . Smokeless tobacco: Never Used  Substance and Sexual Activity  . Alcohol use: No  . Drug use: No  . Sexual activity: Yes    Partners: Male    Birth control/protection: Surgical  Lifestyle  . Physical activity:    Days per week: Not on file    Minutes per session: Not on file  . Stress: Not on file  Relationships  . Social connections:    Talks on phone: Not on file    Gets together: Not on file    Attends religious service: Not on file    Active member of club or organization: Not on file    Attends meetings of clubs or organizations: Not on file    Relationship status: Not on file  Other Topics Concern  . Not on file  Social History Narrative   Lives with family   No caffeine     FAMILY HISTORY:  We obtained a detailed, 4-generation family history.  Significant diagnoses are listed below: Family History  Problem Relation Age of Onset  . Hypertension Mother   . Hypertension Father   . Diverticulosis Father   . Diabetes Father   . Lung cancer Father 23       hx smoking  . Hypertension Maternal Grandmother   . CAD Maternal Grandmother   . Alzheimer's disease Maternal Grandmother        died at 62  . Hypertension Paternal Grandmother   . CAD Paternal Grandmother   . Stroke Paternal Grandfather   . Sickle cell trait Paternal Grandfather   . Pneumonia Maternal Aunt        died at a young adult  . Lymphoma Paternal Aunt 57   [name] [name] has 4 children.  1 son is  adopted.  She has a biological son who is 110, and 2 biological daughters who are 74 and 42.  No history of caner in her children. No grandchildren.  [name] [name] also has a 25 year-old brother with no history of cancer and no children.   [name] [name] father: 53, was diagnosed with lung cancer at about 31. He has a history of smoking.  Paternal aunts/Uncles: 3 paternal uncles in their 52's/70's with no history of cancer. 1 paternal aunt who died at 39 due to [place].  Paternal cousins: no history of cancer.  Paternal grandfather: died in  his 33's with no history of cancer.  He has sickle cell trait.  Paternal grandmother:died in her 62's with no history of cancer.   [name] [name] mother: 67, no history of cancer. Uterus and ovaries intact.  Maternal Aunts/Uncles: 4 maternal uncles, 3 maternal aunts.  2 maternal uncles died in war.  2 uncles are I their 60's/70's with no history of cancer. 1 aunt died as a young adult due to pneumonia.  2 maternal aunts in their 60's/70's with no history of cancer.  Maternal cousins: no history of cancer.  Maternal grandfather: unk Maternal grandmother:died at 40 with no history of cancer.  She had Alzheimer's disease.   [name] [name] is unaware of previous family history of genetic testing for hereditary cancer risks. Patient's maternal ancestors are of African American descent, and paternal ancestors are of African American descent. There is no reported Ashkenazi Jewish ancestry. There is no known consanguinity.  GENETIC COUNSELING ASSESSMENT: [name] [name] is a 47 y.o. female with a personal history which is somewhat suggestive of a Hereditary Cancer Predisposition Syndrome. We, therefore, discussed and recommended the following at today's visit.   DISCUSSION: We reviewed the characteristics, features and inheritance patterns of hereditary cancer syndromes. We also discussed genetic testing, including the appropriate family members to test, the process of testing,  insurance coverage and turn-around-time for results. We discussed the implications of a negative, positive and/or variant of uncertain significant result. We recommended [name] [name] pursue genetic testing for the Common Hereditary Cancers gene panel + Leukemia/Myelodysplastic Panel.   The Common Hereditary Cancer Panel offered by [facility] includes sequencing and/or deletion duplication testing of the following 47 genes: APC, ATM, AXIN2, BARD1, BMPR1A, BRCA1, BRCA2, BRIP1, CDH1, CDKN2A (p14ARF), CDKN2A (p16INK4a), CKD4, CHEK2, CTNNA1, DICER1, EPCAM (Deletion/duplication testing only), GREM1 (promoter region deletion/duplication testing only), KIT, MEN1, MLH1, MSH2, MSH3, MSH6, MUTYH, NBN, NF1, NHTL1, PALB2, PDGFRA, PMS2, POLD1, POLE, PTEN, RAD50, RAD51C, RAD51D, SDHB, SDHC, SDHD, SMAD4, SMARCA4. STK11, TP53, TSC1, TSC2, and VHL.  The following genes were evaluated for sequence changes only: SDHA and HOXB13 c.251G>A variant only.  Myelodysplastic Syndrome/Leukemia Panel: ATM BLM CEBPA EPCAM GATA2 HRAS MLH1 MSH2 MSH6 NBN NF1 PMS2 RUNX1 TERC TERT TP53   We discussed that only 5-10% of cancers are associated with a Hereditary cancer predisposition syndrome.  One of the most common hereditary cancer syndromes that increases breast cancer risk is called Hereditary Breast and Ovarian Cancer (HBOC) syndrome.  This syndrome is caused by mutations in the BRCA1 and BRCA2 genes.  This syndrome increases an individual's lifetime risk to develop breast, ovarian, pancreatic, and other types of cancer.  There are also many other cancer predisposition syndromes caused by mutations in several other genes.  We discussed that if she is found to have a mutation in one of these genes, it may impact surgical decisions, and alter future medical management recommendations such as increased cancer screenings and consideration of risk reducing surgeries.  A positive result could also have implications for the patient's family  members.  A Negative result would mean we were unable to identify a hereditary component to her cancer, but does not rule out the possibility of a hereditary basis for her cancer.  There could be mutations that are undetectable by current technology, or in genes not yet tested or identified to increase cancer risk.    We discussed the potential to find a Variant of Uncertain Significance or VUS.  These are variants that have not yet been identified as pathogenic or benign,  and it is unknown if this variant is associated with increased cancer risk or if this is a normal finding.  Most VUS's are reclassified to benign or likely benign.   It should not be used to make medical management decisions. With time, we suspect the lab will determine the significance of any VUS's identified if any.   Based on [name] [name] personal history of cancer, she meets medical criteria for genetic testing. Despite that she meets criteria, she may still have an out of pocket cost. The laboratory can provide her with an estimate of this cost.    PLAN: After considering the risks, benefits, and limitations, [name] [name]  provided informed consent to pursue genetic testing and the blood sample was sent to [facility] for analysis of the Common Hereditary Cancers Panel + myelodysplastic/Leukemia panel. Results should be available within approximately 2-3 weeks' time, at which point they will be disclosed by telephone to [name] [name], as will any additional recommendations warranted by these results. [name] [name] will receive a summary of her genetic counseling visit and a copy of her results once available. This information will also be available in Epic. We encouraged [name] [name] to remain in contact with cancer genetics annually so that we can continuously update the family history and inform her of any changes in cancer genetics and testing that may be of benefit for her family. [name] [name] questions were answered to her  satisfaction today. Our contact information was provided should additional questions or concerns arise.  Lastly, we encouraged Ms. Bartnick to remain in contact with cancer genetics annually so that we can continuously update the family history and inform her of any changes in cancer genetics and testing that may be of benefit for this family.   Ms.  [name] questions were answered to her satisfaction today. Our contact information was provided should additional questions or concerns arise. Thank you for the referral and allowing [place] to share in the care of your patient.   Tana Felts, MS, [facility] Certified Genetic Counselor [email or web address] phone: [phone]  The patient was seen for a total of 35 minutes in face-to-face genetic counseling.  This patient was discussed with Drs. Magrinat, Lindi Adie and/or Burr Medico who agrees with the above.

## 2018-05-06 NOTE — Progress Notes (Signed)
[place]  [phone] [phone]     ID: [name] Parlier DOB: [birth year]  MR#: [id number]  WGN#:[id number]  Patient Care Team: Marda Stalker, PA-C as PCP - General (Family Medicine) Jerline Pain, MD as PCP - Cardiology (Cardiology) Monika Chestang, Virgie Dad, MD as Consulting Physician (Oncology) Loreta Ave, MD as Referring Physician (Internal Medicine) Donato Heinz, MD as Consulting Physician (Nephrology) Kyung Rudd, MD as Consulting Physician (Radiation Oncology) Jovita Kussmaul, MD as Consulting Physician (General Surgery) OTHER MD:  CHIEF COMPLAINT: Triple positive breast cancer  CURRENT TREATMENT: Neoadjuvant chemotherapy   HISTORY OF CURRENT ILLNESS: From the original intake note:  [name] [name] palpated a lump on 01/15/2018. She felt soreness and shooting pain under her arm and in the right breast. She underwent bilateral diagnostic mammography with tomography and right breast ultrasonography at [facility] on 03/12/2018 showing: breast density category C. The is architectural distortion at the 11 o'clock position. There is also an oval mass in the right breast anterior depth. Examination of the right axilla showed enlarged lymph nodes. Ultrasonography showed a 4.6 cm mass in the right breast upper outer quadrant posterior depth. An additional 1.2 cm oval mass in the right breast 12 o'clock middle depth. The lymph nodes in the right axillary are highly suggestive of malignancy.   Accordingly on 03/16/2018 she proceeded to biopsy of the right breast area in question. The pathology from this procedure showed ([id number]): At both the 11 and 12 o'clock positions: Invasive ductal carcinoma grade II. Ductal carcinoma in situ, high grade, with necrosis and calcifications. Prognostic indicators significant for: estrogen receptor, 90% positive and progesterone receptor, 40% positive, both with strong staining intensity. Proliferation marker Ki67 at 80%.  HER2 amplified with ratios HER2/CEP17 SIGNALS 6.90 and average HER2 copies per cell 14.50  The patient's subsequent history is as detailed below.  INTERVAL HISTORY: [name] returns today for follow up and treatment of her triple positive breast cancer. She receives neoadjuvant chemotherapy with docetaxel, carboplatin, trastuzumab, and pertuzumab given every 21 days. Today is day 1 cycle 2 of 6 planned cycles given 21 days apart.  She tolerated the first cycle generally well. She had a decrease in energy that started 2 days after treatment and lasted for several days. She began to have diarrhea on day 3 after treatment. On the worst day, she had 3 episodes of loose diarrhea. She also developed dry and peeling skin on her face. She experienced nose bleeds. She also had bony and muscle aches. She took 1 tylenol and 1 alive which helped. She also took a epsom salt bath. She had nausea most of one week. Her taste turned sour after a few days.   REVIEW OF SYSTEMS: [name] reports that she tried to eat a banana per day, even though it didn't taste good. Her port worked well. She denies unusual headaches, visual changes, nausea, vomiting, or dizziness. There has been no unusual cough, phlegm production, or pleurisy. This been no change in bowel or bladder habits. She denies unexplained fatigue or unexplained weight loss, bleeding, rash, or fever. A detailed review of systems was otherwise stable.    PAST MEDICAL HISTORY: Past Medical History:  Diagnosis Date  . Anxiety   . Breast cancer, left breast ([place]) 2000   Underwent lumpectomy, chemotherapy and radiation  . Facial paralysis/Bells palsy    left side  . Family history of lung cancer   . Family history of lymphoma   . Hypertension   . Personal history of breast  cancer 05/06/2018  . Renal disorder    just after TIA acute kidney injury  . TIA (transient ischemic attack) 03/16/2017    hospital   The patient has a previous history of left  breast cancer diagnosed in 2000. She was seen by [facility] in [place], [place] under Dr. Loreta Ave. According to the patient, her left breast cancer was stage II with no lymph node involvement (likely T2N0). She had chemotherapy every 3 weeks for about 6 months. She did not takes any "red drugs." She also did not take anti-estrogens (likely ER/PR negative).   TIA in 2018. She saw a neurologist as a precaution. She denies any clotting issues.   PAST SURGICAL HISTORY: Past Surgical History:  Procedure Laterality Date  . BREAST LUMPECTOMY WITH AXILLARY LYMPH NODE BIOPSY Left 2000   biopsy  . PORTACATH PLACEMENT Left 04/08/2018   Procedure: INSERTION PORT-A-CATH;  Surgeon: Jovita Kussmaul, MD;  Location: [place];  Service: General;  Laterality: Left;  . TUBAL LIGATION Bilateral 2004  . WISDOM TOOTH EXTRACTION      FAMILY HISTORY Family History  Problem Relation Age of Onset  . Hypertension Mother   . Hypertension Father   . Diverticulosis Father   . Diabetes Father   . Lung cancer Father 69       hx smoking  . Hypertension Maternal Grandmother   . CAD Maternal Grandmother   . Alzheimer's disease Maternal Grandmother        died at 25  . Hypertension Paternal Grandmother   . CAD Paternal Grandmother   . Stroke Paternal Grandfather   . Sickle cell trait Paternal Grandfather   . Pneumonia Maternal Aunt        died at a young adult  . Lymphoma Paternal Aunt 15  The patient's father is alive at age 27 and had a history of lung cancer. The patient's mother is alive at age 55. The patient has 1 brother and no sisters. She denies a family history of breast or ovarian cancer.    GYNECOLOGIC HISTORY:  No LMP recorded. (Menstrual status: Perimenopausal). Menarche: 47 years old Age at first live birth: 47 years old She is GXP3. She is no longer having periods, which were irregular and light. Her LMP was  January 2019 and  December 2018. She is not having hot flashes. The patient  used oral contraceptive from [id number] and the Depo-provera shot from [id number] with no complications. She never used HRT.    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 Her husband, [name], is a Administrator. The patient's son, Shea Stakes age 58, works at [employer] in [place]. The patient's daughters, Lilia Pro age 79, and Levonne Spiller age 57, are students. The patient's adopted son, Vonna Kotyk age 22, is also a Ship broker.       ADVANCED DIRECTIVES:    HEALTH MAINTENANCE: Social History   Tobacco Use  . Smoking status: Never Smoker  . Smokeless tobacco: Never Used  Substance Use Topics  . Alcohol use: No  . Drug use: No     Colonoscopy:   PAP: 2015  Bone density:   Allergies  Allergen Reactions  . Ace Inhibitors Swelling    Swelling of lips and face  . Lisinopril Swelling    Swelling of lips, face  . Amlodipine Swelling    Leg swelling  . Adhesive [Tape] Itching and Rash    Please use paper tape  . Latex Itching and Rash    Current  Outpatient Medications  Medication Sig Dispense Refill  . acetaminophen (TYLENOL) 500 MG tablet Take 500 mg by mouth every 6 (six) hours as needed for headache (pain).    Marland Kitchen aspirin EC 81 MG EC tablet Take 1 tablet (81 mg total) by mouth daily. 30 tablet 0  . BYSTOLIC 20 MG TABS TAKE 1 TABLET (20 MG TOTAL) BY MOUTH DAILY. 30 tablet 6  . cloNIDine (CATAPRES) 0.2 MG tablet Take 0.2 mg by mouth at bedtime.     Marland Kitchen dexamethasone (DECADRON) 4 MG tablet Take 2 tablets (8 mg total) by mouth 2 (two) times daily. Start the day before Taxotere. Take once the day after, then 2 times a day x 2d. 30 tablet 1  . ferrous sulfate 325 (65 FE) MG tablet Take 1 tablet (325 mg total) by mouth daily. 30 tablet 0  . HYDROcodone-acetaminophen (NORCO/VICODIN) 5-325 MG tablet Take 1-2 tablets by mouth every 6 (six) hours as needed for moderate pain or severe pain. 10 tablet 0  . lidocaine-prilocaine (EMLA) cream Apply to affected area once 30 g 3  .  LORazepam (ATIVAN) 0.5 MG tablet Take 1 tablet (0.5 mg total) by mouth at bedtime as needed (Nausea or vomiting). 30 tablet 0  . Multiple Vitamin (MULITIVITAMIN WITH MINERALS) TABS Take 1 tablet by mouth daily.      . prochlorperazine (COMPAZINE) 10 MG tablet Take 1 tablet (10 mg total) by mouth every 6 (six) hours as needed (Nausea or vomiting). 30 tablet 1  . spironolactone (ALDACTONE) 50 MG tablet TAKE 1 TABLET (50 MG TOTAL) BY MOUTH DAILY. 30 tablet 6   No current facility-administered medications for this visit.     OBJECTIVE: Aged African-American woman who appears stated age  47:   05/07/18 0848  BP: (!) 164/83  Pulse: 80  Resp: 18  Temp: 98.1 F (36.7 C)  SpO2: 96%     Body mass index is 42.92 kg/m.   Wt Readings from Last 3 Encounters:  05/07/18 212 lb 8 oz (96.4 kg)  04/14/18 216 lb 11.2 oz (98.3 kg)  04/08/18 214 lb (97.1 kg)      ECOG FS:1 - Symptomatic but completely ambulatory  She has lost approximately one third of her hair but still has significant amounts of hair which is unusual at this stage Sclerae unicteric, pupils round and equal No cervical or supraclavicular adenopathy Lungs no rales or rhonchi Heart regular rate and rhythm Abd soft, nontender, positive bowel sounds MSK no focal spinal tenderness, no upper extremity lymphedema Neuro: nonfocal, well oriented, appropriate affect Breasts: The mass in the right breast is considerably softer.  The edges are hard to palpate.  There is no erythema.  The left breast is benign.  Both axillae are benign.  LAB RESULTS:  CMP     Component Value Date/Time   NA 137 05/06/2018 1049   K 4.2 05/06/2018 1049   CL 103 05/06/2018 1049   CO2 27 05/06/2018 1049   GLUCOSE 85 05/06/2018 1049   BUN 25 05/06/2018 1049   CREATININE 1.20 (H) 05/06/2018 1049   CREATININE 1.29 (H) 03/25/2018 1129   CALCIUM 10.5 (H) 05/06/2018 1049   PROT 6.9 05/06/2018 1049   ALBUMIN 3.6 05/06/2018 1049   AST 31 05/06/2018 1049    AST 22 03/25/2018 1129   ALT 87 (H) 05/06/2018 1049   ALT 29 03/25/2018 1129   ALKPHOS 150 05/06/2018 1049   BILITOT 0.3 05/06/2018 1049   BILITOT 0.3 03/25/2018 1129   GFRNONAA 53 (  L) 05/06/2018 1049   GFRNONAA 49 (L) 03/25/2018 1129   GFRAA >60 05/06/2018 1049   GFRAA 57 (L) 03/25/2018 1129    No results found for: TOTALPROTELP, ALBUMINELP, A1GS, A2GS, BETS, BETA2SER, GAMS, MSPIKE, SPEI  No results found for: KPAFRELGTCHN, LAMBDASER, [facility]  Lab Results  Component Value Date   WBC 4.4 05/06/2018   NEUTROABS 2.1 05/06/2018   HGB 11.6 05/06/2018   HCT 35.8 05/06/2018   MCV 83.1 05/06/2018   PLT 163 05/06/2018    '@LASTCHEMISTRY'$ @  No results found for: LABCA2  No components found for: JXBJYN829  No results for input(s): INR in the last 168 hours.  No results found for: LABCA2  No results found for: FAO130  No results found for: QMV784  No results found for: ONG295  No results found for: CA2729  No components found for: HGQUANT  No results found for: CEA1 / No results found for: CEA1   No results found for: AFPTUMOR  No results found for: CHROMOGRNA  No results found for: PSA1  Appointment on 05/06/2018  Component Date Value Ref Range Status  . Sodium 05/06/2018 137  136 - 145 mmol/L Final  . Potassium 05/06/2018 4.2  3.5 - 5.1 mmol/L Final  . Chloride 05/06/2018 103  98 - 109 mmol/L Final  . CO2 05/06/2018 27  22 - 29 mmol/L Final  . Glucose, Bld 05/06/2018 85  70 - 140 mg/dL Final  . BUN 05/06/2018 25  7 - 26 mg/dL Final  . Creatinine, Ser 05/06/2018 1.20* 0.60 - 1.10 mg/dL Final  . Calcium 05/06/2018 10.5* 8.4 - 10.4 mg/dL Final  . Total Protein 05/06/2018 6.9  6.4 - 8.3 g/dL Final  . Albumin 05/06/2018 3.6  3.5 - 5.0 g/dL Final  . AST 05/06/2018 31  5 - 34 U/L Final  . ALT 05/06/2018 87* 0 - 55 U/L Final  . Alkaline Phosphatase 05/06/2018 150  40 - 150 U/L Final  . Total Bilirubin 05/06/2018 0.3  0.2 - 1.2 mg/dL Final  . GFR calc non Af  Amer 05/06/2018 53* >60 mL/min Final  . GFR calc Af Amer 05/06/2018 >60  >60 mL/min Final   Comment: (NOTE) The eGFR has been calculated using the CKD EPI equation. This calculation has not been validated in all clinical situations. eGFR's persistently <60 mL/min signify possible Chronic Kidney Disease.   Georgiann Hahn gap 05/06/2018 7  3 - 11 Final   Performed at [facility], [place] [address], [place], [place] [address]  . WBC 05/06/2018 4.4  3.9 - 10.3 K/uL Final  . RBC 05/06/2018 4.31  3.70 - 5.45 MIL/uL Final  . Hemoglobin 05/06/2018 11.6  11.6 - 15.9 g/dL Final  . HCT 05/06/2018 35.8  34.8 - 46.6 % Final  . MCV 05/06/2018 83.1  79.5 - 101.0 fL Final  . MCH 05/06/2018 26.8  25.1 - 34.0 pg Final  . MCHC 05/06/2018 32.3  31.5 - 36.0 g/dL Final  . RDW 05/06/2018 15.7* 11.2 - 14.5 % Final  . Platelets 05/06/2018 163  145 - 400 K/uL Final  . Neutrophils Relative % 05/06/2018 47  % Final  . Neutro Abs 05/06/2018 2.1  1.5 - 6.5 K/uL Final  . Lymphocytes Relative 05/06/2018 30  % Final  . Lymphs Abs 05/06/2018 1.3  0.9 - 3.3 K/uL Final  . Monocytes Relative 05/06/2018 18  % Final  . Monocytes Absolute 05/06/2018 0.8  0.1 - 0.9 K/uL Final  . Eosinophils Relative 05/06/2018 4  %  Final  . Eosinophils Absolute 05/06/2018 0.2  0.0 - 0.5 K/uL Final  . Basophils Relative 05/06/2018 1  % Final  . Basophils Absolute 05/06/2018 0.0  0.0 - 0.1 K/uL Final   Performed at [facility], [address] [place], [place] [address]    (this displays the last labs from the last 3 days)  No results found for: TOTALPROTELP, ALBUMINELP, A1GS, A2GS, BETS, BETA2SER, GAMS, MSPIKE, SPEI (this displays SPEP labs)  No results found for: KPAFRELGTCHN, LAMBDASER, KAPLAMBRATIO (kappa/lambda light chains)  No results found for: HGBA, HGBA2QUANT, HGBFQUANT, HGBSQUAN (Hemoglobinopathy evaluation)   No results found for: LDH  No results found for: IRON, TIBC,  IRONPCTSAT (Iron and TIBC)  No results found for: FERRITIN  Urinalysis    Component Value Date/Time   COLORURINE STRAW (A) 03/17/2017 0600   APPEARANCEUR CLEAR (A) 03/17/2017 0600   LABSPEC 1.031 (H) 03/17/2017 0600   PHURINE 5.0 03/17/2017 0600   GLUCOSEU NEGATIVE 03/17/2017 0600   HGBUR NEGATIVE 03/17/2017 0600   BILIRUBINUR NEGATIVE 03/17/2017 0600   KETONESUR NEGATIVE 03/17/2017 0600   PROTEINUR NEGATIVE 03/17/2017 0600   UROBILINOGEN 0.2 11/29/2011 1656   NITRITE NEGATIVE 03/17/2017 0600   LEUKOCYTESUR NEGATIVE 03/17/2017 0600     STUDIES: Dg Chest Port 1 View  Result Date: 04/08/2018 CLINICAL DATA:  post porta cath  Placement today EXAM: PORTABLE CHEST 1 VIEW COMPARISON:  Chest CT, 04/03/2018 FINDINGS: Left anterior chest wall Port-A-Cath has been placed. Tip projects in the lower superior vena cava just above the caval atrial junction, well positioned. No pneumothorax. Cardiac silhouette is normal in size. No mediastinal or hilar masses. Minor lung base atelectasis.  Lungs otherwise clear. Stable changes from prior left breast surgery. IMPRESSION: 1. Well-positioned left anterior chest wall Port-A-Cath. 2. No acute cardiopulmonary disease.  No pneumothorax. Electronically Signed   By: Lajean Manes M.D.   On: 04/08/2018 10:06   Dg Fluoro Guide Cv Line-no Report  Result Date: 04/08/2018 Fluoroscopy was utilized by the requesting physician.  No radiographic interpretation.    ELIGIBLE FOR AVAILABLE RESEARCH PROTOCOL: no  ASSESSMENT: 47 y.o. [place], [place] woman  (1) status post left lumpectomy in 2000 for a (?) T2N0 breast cancer,  (a) status post adjuvant chemotherapy  (b) status post adjuvant radiation  (c) did not receive antiestrogens  (2) genetics testing 05/06/2018  (3) status post right breast biopsy 03/16/2018 for a clinical mT2-3 N2, anatomic stage III invasive ductal carcinoma, grade 2, triple positive, with an MIB-1 of 80%.  (a) bone scan and chest CT scan  negative for metastases except for a thoracic spine lytic lesion of concern  (4) neoadjuvant chemotherapy consisting of carboplatin, docetaxel, trastuzumab and Pertuzumab every 3 weeks x 6 starting 04/16/2018  (a) epratuzumab held cycle 2 because of diarrhea  (5) trastuzumab and Pertuzumab to be continued to a total of 6 months  (6) definitive surgery to follow  (7) adjuvant radiation to the right breast planned  (8) antiestrogens to start at the completion of local treatment  PLAN: [name] had some expected side effects from chemo, but in general I think she tolerated it well.  I am hopeful by eliminating the epratuzumab from cycle to she will not have diarrhea and therefore will not have dehydration or hypokalemia which likely contributed to her feeling tired.  I also suggested she increase her water intake  Unfortunately the taste perversion is likely to persist even beyond when she completes chemo.  It usually takes a couple of  months after chemo for it to begin to clear.  The bone aches that she experienced should be better the second time.  I suggested she take Aleve and Tylenol 3 times a day as needed to take care of that problem.  Nosebleeds are due to the taxanes.  They are not dangerous although they are a nuisance.  She should not be blowing her nose, of course.  Otherwise I think she is tolerating treatment well.  She is proceeding to her second cycle today.  I will see her again with cycle 3 in 3 weeks.  She knows to call for any other issues that may develop before that visit.   Weronika Birch, Virgie Dad, MD  05/07/18 9:20 AM Medical Oncology and Hematology [facility] [address] [place], [place] [address] Tel. [phone]    Fax. [phone]  Alice Rieger, am acting as scribe for Chauncey Cruel MD.  I, Lurline Del MD, have reviewed the above documentation for accuracy and completeness, and I agree with the above.

## 2018-05-07 ENCOUNTER — Encounter: Payer: Self-pay | Admitting: *Deleted

## 2018-05-07 ENCOUNTER — Telehealth: Payer: Self-pay | Admitting: Oncology

## 2018-05-07 ENCOUNTER — Inpatient Hospital Stay: Payer: 59

## 2018-05-07 ENCOUNTER — Inpatient Hospital Stay: Payer: 59 | Admitting: Oncology

## 2018-05-07 VITALS — BP 164/83 | HR 80 | Temp 98.1°F | Resp 18 | Ht 59.0 in | Wt 212.5 lb

## 2018-05-07 DIAGNOSIS — C50411 Malignant neoplasm of upper-outer quadrant of right female breast: Secondary | ICD-10-CM | POA: Diagnosis not present

## 2018-05-07 DIAGNOSIS — Z807 Family history of other malignant neoplasms of lymphoid, hematopoietic and related tissues: Secondary | ICD-10-CM

## 2018-05-07 DIAGNOSIS — Z95828 Presence of other vascular implants and grafts: Secondary | ICD-10-CM | POA: Insufficient documentation

## 2018-05-07 DIAGNOSIS — Z801 Family history of malignant neoplasm of trachea, bronchus and lung: Secondary | ICD-10-CM

## 2018-05-07 DIAGNOSIS — Z170 Estrogen receptor positive status [ER+]: Secondary | ICD-10-CM

## 2018-05-07 DIAGNOSIS — Z853 Personal history of malignant neoplasm of breast: Secondary | ICD-10-CM | POA: Diagnosis not present

## 2018-05-07 DIAGNOSIS — I10 Essential (primary) hypertension: Secondary | ICD-10-CM | POA: Diagnosis not present

## 2018-05-07 DIAGNOSIS — C50811 Malignant neoplasm of overlapping sites of right female breast: Secondary | ICD-10-CM

## 2018-05-07 DIAGNOSIS — Z5111 Encounter for antineoplastic chemotherapy: Secondary | ICD-10-CM | POA: Diagnosis not present

## 2018-05-07 MED ORDER — SODIUM CHLORIDE 0.9% FLUSH
10.0000 mL | Freq: Once | INTRAVENOUS | Status: AC
  Administered 2018-05-07: 10 mL
  Filled 2018-05-07: qty 10

## 2018-05-07 MED ORDER — DIPHENHYDRAMINE HCL 25 MG PO CAPS
ORAL_CAPSULE | ORAL | Status: AC
  Filled 2018-05-07: qty 1

## 2018-05-07 MED ORDER — HEPARIN SOD (PORK) LOCK FLUSH 100 UNIT/ML IV SOLN
500.0000 [IU] | Freq: Once | INTRAVENOUS | Status: AC | PRN
  Administered 2018-05-07: 500 [IU]
  Filled 2018-05-07: qty 5

## 2018-05-07 MED ORDER — PEGFILGRASTIM 6 MG/0.6ML ~~LOC~~ PSKT
6.0000 mg | PREFILLED_SYRINGE | Freq: Once | SUBCUTANEOUS | Status: AC
  Administered 2018-05-07: 6 mg via SUBCUTANEOUS

## 2018-05-07 MED ORDER — SODIUM CHLORIDE 0.9 % IV SOLN
75.0000 mg/m2 | Freq: Once | INTRAVENOUS | Status: AC
  Administered 2018-05-07: 150 mg via INTRAVENOUS
  Filled 2018-05-07: qty 15

## 2018-05-07 MED ORDER — PEGFILGRASTIM 6 MG/0.6ML ~~LOC~~ PSKT
PREFILLED_SYRINGE | SUBCUTANEOUS | Status: AC
  Filled 2018-05-07: qty 0.6

## 2018-05-07 MED ORDER — PALONOSETRON HCL INJECTION 0.25 MG/5ML
INTRAVENOUS | Status: AC
  Filled 2018-05-07: qty 5

## 2018-05-07 MED ORDER — SODIUM CHLORIDE 0.9 % IV SOLN
6.0000 mg/kg | Freq: Once | INTRAVENOUS | Status: AC
  Administered 2018-05-07: 588 mg via INTRAVENOUS
  Filled 2018-05-07: qty 28

## 2018-05-07 MED ORDER — SODIUM CHLORIDE 0.9 % IV SOLN
Freq: Once | INTRAVENOUS | Status: AC
  Administered 2018-05-07: 10:00:00 via INTRAVENOUS

## 2018-05-07 MED ORDER — DIPHENHYDRAMINE HCL 25 MG PO CAPS
25.0000 mg | ORAL_CAPSULE | Freq: Once | ORAL | Status: AC
  Administered 2018-05-07: 25 mg via ORAL

## 2018-05-07 MED ORDER — ACETAMINOPHEN 325 MG PO TABS
650.0000 mg | ORAL_TABLET | Freq: Once | ORAL | Status: AC
  Administered 2018-05-07: 650 mg via ORAL

## 2018-05-07 MED ORDER — SODIUM CHLORIDE 0.9% FLUSH
10.0000 mL | INTRAVENOUS | Status: DC | PRN
  Administered 2018-05-07: 10 mL
  Filled 2018-05-07: qty 10

## 2018-05-07 MED ORDER — PALONOSETRON HCL INJECTION 0.25 MG/5ML
0.2500 mg | Freq: Once | INTRAVENOUS | Status: AC
  Administered 2018-05-07: 0.25 mg via INTRAVENOUS

## 2018-05-07 MED ORDER — ACETAMINOPHEN 325 MG PO TABS
ORAL_TABLET | ORAL | Status: AC
  Filled 2018-05-07: qty 2

## 2018-05-07 MED ORDER — SODIUM CHLORIDE 0.9 % IV SOLN
Freq: Once | INTRAVENOUS | Status: AC
  Administered 2018-05-07: 10:00:00 via INTRAVENOUS
  Filled 2018-05-07: qty 5

## 2018-05-07 MED ORDER — SODIUM CHLORIDE 0.9 % IV SOLN
550.0000 mg | Freq: Once | INTRAVENOUS | Status: AC
  Administered 2018-05-07: 550 mg via INTRAVENOUS
  Filled 2018-05-07: qty 55

## 2018-05-07 NOTE — Patient Instructions (Signed)
[place] Discharge Instructions for Patients Receiving Chemotherapy  Today you received the following chemotherapy agents herceptin/taxotere/carboplatin   To help prevent nausea and vomiting after your treatment, we encourage you to take your nausea medication as directed. If you develop nausea and vomiting that is not controlled by your nausea medication, call the clinic.   BELOW ARE SYMPTOMS THAT SHOULD BE REPORTED IMMEDIATELY:  *FEVER GREATER THAN 100.5 F  *CHILLS WITH OR WITHOUT FEVER  NAUSEA AND VOMITING THAT IS NOT CONTROLLED WITH YOUR NAUSEA MEDICATION  *UNUSUAL SHORTNESS OF BREATH  *UNUSUAL BRUISING OR BLEEDING  TENDERNESS IN MOUTH AND THROAT WITH OR WITHOUT PRESENCE OF ULCERS  *URINARY PROBLEMS  *BOWEL PROBLEMS  UNUSUAL RASH Items with * indicate a potential emergency and should be followed up as soon as possible.  Feel free to call the clinic you have any questions or concerns. The clinic phone number is [phone].

## 2018-05-07 NOTE — Telephone Encounter (Signed)
Gave patient avs and calendar of upcoming appointments.

## 2018-05-15 ENCOUNTER — Telehealth: Payer: Self-pay | Admitting: *Deleted

## 2018-05-15 ENCOUNTER — Encounter: Payer: Self-pay | Admitting: *Deleted

## 2018-05-15 MED ORDER — DIPHENOXYLATE-ATROPINE 2.5-0.025 MG PO TABS: 1.0000 | ORAL_TABLET | Freq: Four times a day (QID) | ORAL | 1 refills | Status: DC | PRN

## 2018-05-15 MED ORDER — CHOLESTYRAMINE 4 GM/DOSE PO POWD: 4.0000 g | Freq: Two times a day (BID) | ORAL | 3 refills | Status: DC

## 2018-05-15 NOTE — Telephone Encounter (Signed)
This RN spoke with the patient per her call stating onset of diarrhea not controlled with use of imodium.  She is using the maximum amount of imodium.  She states diarrhea as " watery".  She is able to hydrate well including use of ensures.  Per discussion with [facility]CC/NP - obtained prescription for Questran and lomotil.  Called to pharmacy as well as informed pt- with goal of use of questran with imodium- she is to only use the lomotil if she has diarrhea despite use of questran with imodium.  Discussed hydration including to avoid fake sugar drinks ( she inquired about propels ) due to possible further aggravation of diarrhea.  [place] understands to call this RN later today if diarrhea is not controlled despite above interventions.

## 2018-05-15 NOTE — Telephone Encounter (Signed)
"  What else can I do about progressive diarrhea the past week since last chemotherapy?  It's gotten the best of me yesterday and today.  Worked a few hours Wednesday and made a mess.  Have got to return to work next week.  Using dexamethasone and imodium.  Four diarrhea bowel movements since 4:20 am.  I'm tired, weak with tightness and soreness.  I drank about 36 oz water yesterday.  I eat soups and broths and two regular Ensure daily.  I can come in today if needed"   Before call transfer this nurse discussed foods to avoid that increase diarrhea, adding plain or vanilla yogurt, B.R..A.T. Diet, increasing water intake along with sports drinks or Gatorade.

## 2018-05-20 ENCOUNTER — Telehealth: Payer: Self-pay | Admitting: Genetics

## 2018-05-26 ENCOUNTER — Other Ambulatory Visit: Payer: Self-pay | Admitting: Oncology

## 2018-05-26 DIAGNOSIS — Z170 Estrogen receptor positive status [ER+]: Secondary | ICD-10-CM

## 2018-05-26 DIAGNOSIS — C50411 Malignant neoplasm of upper-outer quadrant of right female breast: Secondary | ICD-10-CM

## 2018-05-26 NOTE — Progress Notes (Signed)
[place]  [phone] [phone]     ID: [name] [name] DOB: Sep 27, 1971  MR#: [id number]  [facility]#:[id number]  Patient Care Team: Marda Stalker, PA-C as PCP - General (Family Medicine) Jerline Pain, MD as PCP - Cardiology (Cardiology) Orvil Faraone, Virgie Dad, MD as Consulting Physician (Oncology) Loreta Ave, MD as Referring Physician (Internal Medicine) Donato Heinz, MD as Consulting Physician (Nephrology) Kyung Rudd, MD as Consulting Physician (Radiation Oncology) Jovita Kussmaul, MD as Consulting Physician (General Surgery) OTHER MD:  CHIEF COMPLAINT: Triple positive breast cancer  CURRENT TREATMENT: Neoadjuvant chemotherapy   HISTORY OF CURRENT ILLNESS: From the original intake note:  [name] Gewirtz palpated a lump on 01/15/2018. She felt soreness and shooting pain under her arm and in the right breast. She underwent bilateral diagnostic mammography with tomography and right breast ultrasonography at [facility] on 03/12/2018 showing: breast density category C. The is architectural distortion at the 11 o'clock position. There is also an oval mass in the right breast anterior depth. Examination of the right axilla showed enlarged lymph nodes. Ultrasonography showed a 4.6 cm mass in the right breast upper outer quadrant posterior depth. An additional 1.2 cm oval mass in the right breast 12 o'clock middle depth. The lymph nodes in the right axillary are highly suggestive of malignancy.   Accordingly on 03/16/2018 she proceeded to biopsy of the right breast area in question. The pathology from this procedure showed ([id number]): At both the 11 and 12 o'clock positions: Invasive ductal carcinoma grade II. Ductal carcinoma in situ, high grade, with necrosis and calcifications. Prognostic indicators significant for: estrogen receptor, 90% positive and progesterone receptor, 40% positive, both with strong staining intensity. Proliferation marker Ki67 at 80%.  HER2 amplified with ratios HER2/CEP17 SIGNALS 6.90 and average HER2 copies per cell 14.50  The patient's subsequent history is as detailed below.  INTERVAL HISTORY: [name] returns today for follow-up and treatment of her triple positive breast cancer. She receives neoadjuvant chemotherapy with docetaxel, carboplatin, trastuzumab, and pertuzumab given every 21 days. Today is day 1 cycle 3 of 6 planned cycles given 21 days apart.   REVIEW OF SYSTEMS: [name] is doing well. However, she reports a recent stomach virus that was around the 21st of June. Her symptoms included nausea, vomiting, diarrhea. She adds that her daughter and son both had similar symptoms. During that time she followed the BRAT diet. Her taste has mildly improved. She continues to work and stay busy. She will go to [employer], the pool and will take her children to various activities. She is having an issue with her tooth and will be seeing her dentist in the near future for this issue. The patient denies unusual headaches, visual changes, or dizziness. There has been no unusual cough, phlegm production, or pleurisy. This been no change in bladder habits. The patient denies unexplained fatigue or unexplained weight loss, bleeding, rash, or fever. A detailed review of systems was otherwise noncontributory.     PAST MEDICAL HISTORY: Past Medical History:  Diagnosis Date  . Anxiety   . Breast cancer, left breast ([place]) 2000   Underwent lumpectomy, chemotherapy and radiation  . Facial paralysis/Bells palsy    left side  . Family history of lung cancer   . Family history of lymphoma   . Hypertension   . Personal history of breast cancer 05/06/2018  . Renal disorder    just after TIA acute kidney injury  . TIA (transient ischemic attack) 03/16/2017    hospital   The  patient has a previous history of left breast cancer diagnosed in 2000. She was seen by [facility] in [place], [place] under Dr. Loreta Ave.  According to the patient, her left breast cancer was stage II with no lymph node involvement (likely T2N0). She had chemotherapy every 3 weeks for about 6 months. She did not takes any "red drugs." She also did not take anti-estrogens (likely ER/PR negative).   TIA in 2018. She saw a neurologist as a precaution. She denies any clotting issues.   PAST SURGICAL HISTORY: Past Surgical History:  Procedure Laterality Date  . BREAST LUMPECTOMY WITH AXILLARY LYMPH NODE BIOPSY Left 2000   biopsy  . PORTACATH PLACEMENT Left 04/08/2018   Procedure: INSERTION PORT-A-CATH;  Surgeon: Jovita Kussmaul, MD;  Location: [place];  Service: General;  Laterality: Left;  . TUBAL LIGATION Bilateral 2004  . WISDOM TOOTH EXTRACTION      FAMILY HISTORY Family History  Problem Relation Age of Onset  . Hypertension Mother   . Hypertension Father   . Diverticulosis Father   . Diabetes Father   . Lung cancer Father 69       hx smoking  . Hypertension Maternal Grandmother   . CAD Maternal Grandmother   . Alzheimer's disease Maternal Grandmother        died at 89  . Hypertension Paternal Grandmother   . CAD Paternal Grandmother   . Stroke Paternal Grandfather   . Sickle cell trait Paternal Grandfather   . Pneumonia Maternal Aunt        died at a young adult  . Lymphoma Paternal Aunt 36  The patient's father is alive at age 44 and had a history of lung cancer. The patient's mother is alive at age 33. The patient has 1 brother and no sisters. She denies a family history of breast or ovarian cancer.    GYNECOLOGIC HISTORY:  No LMP recorded. (Menstrual status: Perimenopausal). Menarche: 47 years old Age at first live birth: 47 years old She is GXP3. She is no longer having periods, which were irregular and light. Her LMP was  January 2019 and  December 2018. She is not having hot flashes. The patient used oral contraceptive from [phone] and the Depo-provera shot from [phone] with no complications. She never used  HRT.    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 Her husband, [name], is a Administrator. The patient's son, Shea Stakes age 22, works at [employer] in [place]. The patient's daughters, Lilia Pro age 79, and Levonne Spiller age 32, are students. The patient's adopted son, Vonna Kotyk age 77, is also a Ship broker.       ADVANCED DIRECTIVES:    HEALTH MAINTENANCE: Social History   Tobacco Use  . Smoking status: Never Smoker  . Smokeless tobacco: Never Used  Substance Use Topics  . Alcohol use: No  . Drug use: No     Colonoscopy:   PAP: 2015  Bone density:   Allergies  Allergen Reactions  . Ace Inhibitors Swelling    Swelling of lips and face  . Lisinopril Swelling    Swelling of lips, face  . Amlodipine Swelling    Leg swelling  . Adhesive [Tape] Itching and Rash    Please use paper tape  . Latex Itching and Rash    Current Outpatient Medications  Medication Sig Dispense Refill  . acetaminophen (TYLENOL) 500 MG tablet Take 500 mg by mouth every 6 (six) hours as needed for headache (pain).    Marland Kitchen  aspirin EC 81 MG EC tablet Take 1 tablet (81 mg total) by mouth daily. 30 tablet 0  . BYSTOLIC 20 MG TABS TAKE 1 TABLET (20 MG TOTAL) BY MOUTH DAILY. 30 tablet 6  . cholestyramine (QUESTRAN) 4 GM/DOSE powder Take 1 packet (4 g total) by mouth 2 (two) times daily. 378 g 3  . cloNIDine (CATAPRES) 0.2 MG tablet Take 0.2 mg by mouth at bedtime.     Marland Kitchen dexamethasone (DECADRON) 4 MG tablet Take 2 tablets (8 mg total) by mouth 2 (two) times daily. Start the day before Taxotere. Take once the day after, then 2 times a day x 2d. 30 tablet 1  . diphenoxylate-atropine (LOMOTIL) 2.5-0.025 MG tablet Take 1 tablet by mouth 4 (four) times daily as needed for diarrhea or loose stools. 30 tablet 1  . ferrous sulfate 325 (65 FE) MG tablet Take 1 tablet (325 mg total) by mouth daily. 30 tablet 0  . HYDROcodone-acetaminophen (NORCO/VICODIN) 5-325 MG tablet Take 1-2 tablets by mouth every 6 (six)  hours as needed for moderate pain or severe pain. 10 tablet 0  . lidocaine-prilocaine (EMLA) cream Apply to affected area once 30 g 3  . LORazepam (ATIVAN) 0.5 MG tablet TAKE 1 TABLET (0.5 MG TOTAL) BY MOUTH AT BEDTIME AS NEEDED (NAUSEA OR VOMITING). 30 tablet 0  . Multiple Vitamin (MULITIVITAMIN WITH MINERALS) TABS Take 1 tablet by mouth daily.      . prochlorperazine (COMPAZINE) 10 MG tablet Take 1 tablet (10 mg total) by mouth every 6 (six) hours as needed (Nausea or vomiting). 30 tablet 1  . spironolactone (ALDACTONE) 50 MG tablet TAKE 1 TABLET (50 MG TOTAL) BY MOUTH DAILY. 30 tablet 6   No current facility-administered medications for this visit.     OBJECTIVE: Aged African-American woman in no acute distress  Vitals:   05/28/18 0824  BP: 138/86  Pulse: 81  Resp: 18  Temp: 98.4 F (36.9 C)  SpO2: 100%     Body mass index is 42.41 kg/m.   Wt Readings from Last 3 Encounters:  05/28/18 210 lb (95.3 kg)  05/07/18 212 lb 8 oz (96.4 kg)  04/14/18 216 lb 11.2 oz (98.3 kg)      ECOG FS:1 - Symptomatic but completely ambulatory  Sclerae unicteric, EOMs intact Oropharynx clear and moist No cervical or supraclavicular adenopathy Lungs no rales or rhonchi Heart regular rate and rhythm Abd soft, nontender, positive bowel sounds MSK no focal spinal tenderness, no upper extremity lymphedema Neuro: nonfocal, well oriented, appropriate affect Breasts: With difficulty I can feel what may be a slight area of induration, very poorly defined, coming into the right breast laterally.  Left breast is benign, status post prior lumpectomy and radiation.  Both axillae are benign.  LAB RESULTS:  CMP     Component Value Date/Time   NA 137 05/06/2018 1049   K 4.2 05/06/2018 1049   CL 103 05/06/2018 1049   CO2 27 05/06/2018 1049   GLUCOSE 85 05/06/2018 1049   BUN 25 05/06/2018 1049   CREATININE 1.20 (H) 05/06/2018 1049   CREATININE 1.29 (H) 03/25/2018 1129   CALCIUM 10.5 (H) 05/06/2018  1049   PROT 6.9 05/06/2018 1049   ALBUMIN 3.6 05/06/2018 1049   AST 31 05/06/2018 1049   AST 22 03/25/2018 1129   ALT 87 (H) 05/06/2018 1049   ALT 29 03/25/2018 1129   ALKPHOS 150 05/06/2018 1049   BILITOT 0.3 05/06/2018 1049   BILITOT 0.3 03/25/2018 1129   GFRNONAA  53 (L) 05/06/2018 1049   GFRNONAA 49 (L) 03/25/2018 1129   GFRAA >60 05/06/2018 1049   GFRAA 57 (L) 03/25/2018 1129    No results found for: TOTALPROTELP, ALBUMINELP, A1GS, A2GS, BETS, BETA2SER, GAMS, MSPIKE, SPEI  No results found for: KPAFRELGTCHN, LAMBDASER, KAPLAMBRATIO  Lab Results  Component Value Date   WBC 8.6 05/28/2018   NEUTROABS 7.9 (H) 05/28/2018   HGB 11.5 (L) 05/28/2018   HCT 35.1 05/28/2018   MCV 84.3 05/28/2018   PLT 145 05/28/2018    _0 @  No results found for: LABCA2  No components found for: DJMEQA834  No results for input(s): INR in the last 168 hours.  No results found for: LABCA2  No results found for: HDQ222  No results found for: LNL892  No results found for: JJH417  No results found for: CA2729  No components found for: HGQUANT  No results found for: CEA1 / No results found for: CEA1   No results found for: AFPTUMOR  No results found for: CHROMOGRNA  No results found for: PSA1  Appointment on 05/28/2018  Component Date Value Ref Range Status  . WBC 05/28/2018 8.6  3.9 - 10.3 K/uL Final  . RBC 05/28/2018 4.16  3.70 - 5.45 MIL/uL Final  . Hemoglobin 05/28/2018 11.5* 11.6 - 15.9 g/dL Final  . HCT 05/28/2018 35.1  34.8 - 46.6 % Final  . MCV 05/28/2018 84.3  79.5 - 101.0 fL Final  . MCH 05/28/2018 27.7  25.1 - 34.0 pg Final  . MCHC 05/28/2018 32.8  31.5 - 36.0 g/dL Final  . RDW 05/28/2018 17.0* 11.2 - 14.5 % Final  . Platelets 05/28/2018 145  145 - 400 K/uL Final  . Neutrophils Relative % 05/28/2018 92  % Final  . Neutro Abs 05/28/2018 7.9* 1.5 - 6.5 K/uL Final  . Lymphocytes Relative 05/28/2018 6  % Final  . Lymphs Abs 05/28/2018 0.5* 0.9 - 3.3 K/uL  Final  . Monocytes Relative 05/28/2018 1  % Final  . Monocytes Absolute 05/28/2018 0.1  0.1 - 0.9 K/uL Final  . Eosinophils Relative 05/28/2018 0  % Final  . Eosinophils Absolute 05/28/2018 0.0  0.0 - 0.5 K/uL Final  . Basophils Relative 05/28/2018 1  % Final  . Basophils Absolute 05/28/2018 0.1  0.0 - 0.1 K/uL Final   Performed at [facility], [place] [address], [place], [place] [address]    (this displays the last labs from the last 3 days)  No results found for: TOTALPROTELP, ALBUMINELP, A1GS, A2GS, BETS, BETA2SER, GAMS, MSPIKE, SPEI (this displays SPEP labs)  No results found for: KPAFRELGTCHN, LAMBDASER, KAPLAMBRATIO (kappa/lambda light chains)  No results found for: HGBA, HGBA2QUANT, HGBFQUANT, HGBSQUAN (Hemoglobinopathy evaluation)   No results found for: LDH  No results found for: IRON, TIBC, IRONPCTSAT (Iron and TIBC)  No results found for: FERRITIN  Urinalysis    Component Value Date/Time   COLORURINE STRAW (A) 03/17/2017 0600   APPEARANCEUR CLEAR (A) 03/17/2017 0600   LABSPEC 1.031 (H) 03/17/2017 0600   PHURINE 5.0 03/17/2017 0600   GLUCOSEU NEGATIVE 03/17/2017 0600   HGBUR NEGATIVE 03/17/2017 0600   BILIRUBINUR NEGATIVE 03/17/2017 0600   KETONESUR NEGATIVE 03/17/2017 0600   PROTEINUR NEGATIVE 03/17/2017 0600   UROBILINOGEN 0.2 11/29/2011 1656   NITRITE NEGATIVE 03/17/2017 0600   LEUKOCYTESUR NEGATIVE 03/17/2017 0600     STUDIES: No results found.  ELIGIBLE FOR AVAILABLE RESEARCH PROTOCOL: no  ASSESSMENT: 47 y.o. [place], [place] woman  (1) status post left lumpectomy in 2000  for a (?) T2N0 breast cancer,  (a) status post adjuvant chemotherapy  (b) status post adjuvant radiation  (c) did not receive antiestrogens  (2) genetics testing 05/06/2018, results pending  (3) status post right breast biopsy 03/16/2018 for a clinical mT2-3 N2, anatomic stage III invasive ductal carcinoma, grade 2, triple positive, with an MIB-1 of  80%.  (a) bone scan and chest CT scan negative for metastases except for a thoracic spine lytic lesion of concern  (4) neoadjuvant chemotherapy consisting of carboplatin, docetaxel, trastuzumab and Pertuzumab every 3 weeks x 6 starting 04/16/2018  (a) pertuzumab held beginning with cycle 2 because of diarrhea  (5) trastuzumab to be continued to a total of 6 months  (6) definitive surgery to follow  (7) adjuvant radiation to the right breast planned  (8) antiestrogens to start at the completion of local treatment  PLAN: [name] is tolerating her chemotherapy generally well, with no unusual or surprising side effects.  She is able to continue to work and she is even able to do some exercise.  I reassured her that she had done well with her recent viral syndrome, which has resolved.  We reviewed her counts in detail today.  I think we are seeing a good response.  After 2 cycles it is difficult for me to feel anything in the right breast.  She will proceed with cycle 3 today.  She has 3 more cycles to go and have gone ahead and schedule those as well as put in visits on treatment today.  She knows we will be glad to see her in between treatments as needed.  She is also scheduled for repeat echocardiogram 06/29/2018  She knows to call for any other issues that may develop before the next visit.   Bradly Sangiovanni, Virgie Dad, MD  05/28/18 8:44 AM Medical Oncology and Hematology [facility] [address] [place], [place] [address] Tel. [phone]    Fax. [phone]  I, Margit Banda am acting as a scribe for Chauncey Cruel, MD.   I, Lurline Del MD, have reviewed the above documentation for accuracy and completeness, and I agree with the above.

## 2018-05-28 ENCOUNTER — Inpatient Hospital Stay: Payer: 59

## 2018-05-28 ENCOUNTER — Inpatient Hospital Stay: Payer: 59 | Admitting: Oncology

## 2018-05-28 ENCOUNTER — Telehealth: Payer: Self-pay | Admitting: Adult Health

## 2018-05-28 ENCOUNTER — Other Ambulatory Visit: Payer: 59

## 2018-05-28 ENCOUNTER — Ambulatory Visit: Payer: 59

## 2018-05-28 ENCOUNTER — Other Ambulatory Visit: Payer: Self-pay | Admitting: Oncology

## 2018-05-28 VITALS — BP 138/86 | HR 81 | Temp 98.4°F | Resp 18 | Ht 59.0 in | Wt 210.0 lb

## 2018-05-28 DIAGNOSIS — C50411 Malignant neoplasm of upper-outer quadrant of right female breast: Secondary | ICD-10-CM

## 2018-05-28 DIAGNOSIS — Z5111 Encounter for antineoplastic chemotherapy: Secondary | ICD-10-CM | POA: Diagnosis not present

## 2018-05-28 DIAGNOSIS — Z170 Estrogen receptor positive status [ER+]: Secondary | ICD-10-CM | POA: Diagnosis not present

## 2018-05-28 DIAGNOSIS — Z801 Family history of malignant neoplasm of trachea, bronchus and lung: Secondary | ICD-10-CM

## 2018-05-28 DIAGNOSIS — Z803 Family history of malignant neoplasm of breast: Secondary | ICD-10-CM | POA: Diagnosis not present

## 2018-05-28 DIAGNOSIS — C50811 Malignant neoplasm of overlapping sites of right female breast: Secondary | ICD-10-CM | POA: Diagnosis not present

## 2018-05-28 DIAGNOSIS — I10 Essential (primary) hypertension: Secondary | ICD-10-CM

## 2018-05-28 LAB — CBC WITH DIFFERENTIAL/PLATELET
Basophils Absolute: 0.1 10*3/uL (ref 0.0–0.1)
Basophils Relative: 1 %
Eosinophils Absolute: 0 10*3/uL (ref 0.0–0.5)
Eosinophils Relative: 0 %
HCT: 35.1 % (ref 34.8–46.6)
Hemoglobin: 11.5 g/dL — ABNORMAL LOW (ref 11.6–15.9)
Lymphocytes Relative: 6 %
Lymphs Abs: 0.5 10*3/uL — ABNORMAL LOW (ref 0.9–3.3)
MCH: 27.7 pg (ref 25.1–34.0)
MCHC: 32.8 g/dL (ref 31.5–36.0)
MCV: 84.3 fL (ref 79.5–101.0)
Monocytes Absolute: 0.1 10*3/uL (ref 0.1–0.9)
Monocytes Relative: 1 %
Neutro Abs: 7.9 10*3/uL — ABNORMAL HIGH (ref 1.5–6.5)
Neutrophils Relative %: 92 %
Platelets: 145 10*3/uL (ref 145–400)
RBC: 4.16 MIL/uL (ref 3.70–5.45)
RDW: 17 % — ABNORMAL HIGH (ref 11.2–14.5)
WBC: 8.6 10*3/uL (ref 3.9–10.3)

## 2018-05-28 LAB — COMPREHENSIVE METABOLIC PANEL
ALT: 28 U/L (ref 0–44)
AST: 16 U/L (ref 15–41)
Albumin: 3.7 g/dL (ref 3.5–5.0)
Alkaline Phosphatase: 119 U/L (ref 38–126)
Anion gap: 10 (ref 5–15)
BUN: 29 mg/dL — ABNORMAL HIGH (ref 6–20)
CO2: 21 mmol/L — ABNORMAL LOW (ref 22–32)
Calcium: 11.1 mg/dL — ABNORMAL HIGH (ref 8.9–10.3)
Chloride: 104 mmol/L (ref 98–111)
Creatinine, Ser: 1.35 mg/dL — ABNORMAL HIGH (ref 0.44–1.00)
GFR calc Af Amer: 54 mL/min — ABNORMAL LOW (ref >60)
GFR calc non Af Amer: 46 mL/min — ABNORMAL LOW (ref >60)
Glucose, Bld: 221 mg/dL — ABNORMAL HIGH (ref 70–99)
Potassium: 4.1 mmol/L (ref 3.5–5.1)
Sodium: 135 mmol/L (ref 135–145)
Total Bilirubin: <0.2 mg/dL — ABNORMAL LOW (ref 0.3–1.2)
Total Protein: 7.4 g/dL (ref 6.5–8.1)

## 2018-05-28 MED ORDER — SODIUM CHLORIDE 0.9 % IV SOLN
523.5000 mg | Freq: Once | INTRAVENOUS | Status: AC
  Administered 2018-05-28: 520 mg via INTRAVENOUS
  Filled 2018-05-28: qty 52

## 2018-05-28 MED ORDER — PEGFILGRASTIM 6 MG/0.6ML ~~LOC~~ PSKT
6.0000 mg | PREFILLED_SYRINGE | Freq: Once | SUBCUTANEOUS | Status: AC
  Administered 2018-05-28: 6 mg via SUBCUTANEOUS

## 2018-05-28 MED ORDER — PALONOSETRON HCL INJECTION 0.25 MG/5ML
INTRAVENOUS | Status: AC
  Filled 2018-05-28: qty 5

## 2018-05-28 MED ORDER — SODIUM CHLORIDE 0.9 % IV SOLN
Freq: Once | INTRAVENOUS | Status: AC
  Administered 2018-05-28: 10:00:00 via INTRAVENOUS
  Filled 2018-05-28: qty 5

## 2018-05-28 MED ORDER — PEGFILGRASTIM 6 MG/0.6ML ~~LOC~~ PSKT
PREFILLED_SYRINGE | SUBCUTANEOUS | Status: AC
  Filled 2018-05-28: qty 0.6

## 2018-05-28 MED ORDER — ACETAMINOPHEN 325 MG PO TABS
ORAL_TABLET | ORAL | Status: AC
  Filled 2018-05-28: qty 2

## 2018-05-28 MED ORDER — SODIUM CHLORIDE 0.9 % IV SOLN
75.0000 mg/m2 | Freq: Once | INTRAVENOUS | Status: AC
  Administered 2018-05-28: 150 mg via INTRAVENOUS
  Filled 2018-05-28: qty 15

## 2018-05-28 MED ORDER — TRASTUZUMAB CHEMO 150 MG IV SOLR
6.0000 mg/kg | Freq: Once | INTRAVENOUS | Status: AC
  Administered 2018-05-28: 588 mg via INTRAVENOUS
  Filled 2018-05-28: qty 28

## 2018-05-28 MED ORDER — HEPARIN SOD (PORK) LOCK FLUSH 100 UNIT/ML IV SOLN
500.0000 [IU] | Freq: Once | INTRAVENOUS | Status: AC | PRN
  Administered 2018-05-28: 500 [IU]
  Filled 2018-05-28: qty 5

## 2018-05-28 MED ORDER — ACETAMINOPHEN 325 MG PO TABS
650.0000 mg | ORAL_TABLET | Freq: Once | ORAL | Status: AC
  Administered 2018-05-28: 650 mg via ORAL

## 2018-05-28 MED ORDER — PALONOSETRON HCL INJECTION 0.25 MG/5ML
0.2500 mg | Freq: Once | INTRAVENOUS | Status: AC
  Administered 2018-05-28: 0.25 mg via INTRAVENOUS

## 2018-05-28 MED ORDER — DIPHENHYDRAMINE HCL 25 MG PO CAPS
ORAL_CAPSULE | ORAL | Status: AC
Start: 2018-05-28 — End: ?
  Filled 2018-05-28: qty 1

## 2018-05-28 MED ORDER — SODIUM CHLORIDE 0.9 % IV SOLN
Freq: Once | INTRAVENOUS | Status: AC
  Administered 2018-05-28: 10:00:00 via INTRAVENOUS

## 2018-05-28 MED ORDER — SODIUM CHLORIDE 0.9% FLUSH
10.0000 mL | INTRAVENOUS | Status: DC | PRN
  Administered 2018-05-28: 10 mL
  Filled 2018-05-28: qty 10

## 2018-05-28 MED ORDER — DIPHENHYDRAMINE HCL 25 MG PO CAPS
25.0000 mg | ORAL_CAPSULE | Freq: Once | ORAL | Status: AC
  Administered 2018-05-28: 25 mg via ORAL

## 2018-05-28 NOTE — Patient Instructions (Signed)
[facility] Discharge Instructions for Patients Receiving Chemotherapy  Today you received the following chemotherapy agents:  Herceptin, Taxotere, and Carboplatin.  To help prevent nausea and vomiting after your treatment, we encourage you to take your nausea medication as directed.   If you develop nausea and vomiting that is not controlled by your nausea medication, call the clinic.   BELOW ARE SYMPTOMS THAT SHOULD BE REPORTED IMMEDIATELY:  *FEVER GREATER THAN 100.5 F  *CHILLS WITH OR WITHOUT FEVER  NAUSEA AND VOMITING THAT IS NOT CONTROLLED WITH YOUR NAUSEA MEDICATION  *UNUSUAL SHORTNESS OF BREATH  *UNUSUAL BRUISING OR BLEEDING  TENDERNESS IN MOUTH AND THROAT WITH OR WITHOUT PRESENCE OF ULCERS  *URINARY PROBLEMS  *BOWEL PROBLEMS  UNUSUAL RASH Items with * indicate a potential emergency and should be followed up as soon as possible.  Feel free to call the clinic should you have any questions or concerns. The clinic phone number is [phone].  Please show the [place] at check-in to the Emergency Department and triage nurse.

## 2018-05-28 NOTE — Telephone Encounter (Signed)
Gave avs and calendar ° °

## 2018-06-03 ENCOUNTER — Encounter: Payer: Self-pay | Admitting: *Deleted

## 2018-06-03 NOTE — Progress Notes (Signed)
FMLA completed and mailed to patient address on file. No fax number provided to fax forms to. 

## 2018-06-05 ENCOUNTER — Telehealth: Payer: Self-pay

## 2018-06-05 NOTE — Telephone Encounter (Signed)
Returned patient's call regarding bilateral hip pain and right ankle pain. Patient has completed 3 cycles of HTC with neulasta.  Per message patient has tried claritin, aleve, and tylenol.   Left message to have patient return call.

## 2018-06-18 ENCOUNTER — Telehealth: Payer: Self-pay | Admitting: Adult Health

## 2018-06-18 ENCOUNTER — Inpatient Hospital Stay: Payer: 59

## 2018-06-18 ENCOUNTER — Other Ambulatory Visit: Payer: Self-pay | Admitting: Oncology

## 2018-06-18 ENCOUNTER — Inpatient Hospital Stay: Payer: 59 | Attending: Oncology

## 2018-06-18 ENCOUNTER — Other Ambulatory Visit: Payer: 59

## 2018-06-18 ENCOUNTER — Inpatient Hospital Stay: Payer: 59 | Admitting: Adult Health

## 2018-06-18 ENCOUNTER — Encounter: Payer: Self-pay | Admitting: Adult Health

## 2018-06-18 VITALS — BP 140/84 | HR 82 | Temp 98.8°F | Resp 18 | Ht 59.0 in | Wt 214.1 lb

## 2018-06-18 DIAGNOSIS — Z807 Family history of other malignant neoplasms of lymphoid, hematopoietic and related tissues: Secondary | ICD-10-CM

## 2018-06-18 DIAGNOSIS — K121 Other forms of stomatitis: Secondary | ICD-10-CM

## 2018-06-18 DIAGNOSIS — C50411 Malignant neoplasm of upper-outer quadrant of right female breast: Secondary | ICD-10-CM

## 2018-06-18 DIAGNOSIS — Z170 Estrogen receptor positive status [ER+]: Secondary | ICD-10-CM

## 2018-06-18 DIAGNOSIS — C50811 Malignant neoplasm of overlapping sites of right female breast: Secondary | ICD-10-CM

## 2018-06-18 DIAGNOSIS — R200 Anesthesia of skin: Secondary | ICD-10-CM | POA: Insufficient documentation

## 2018-06-18 DIAGNOSIS — R197 Diarrhea, unspecified: Secondary | ICD-10-CM

## 2018-06-18 DIAGNOSIS — Z801 Family history of malignant neoplasm of trachea, bronchus and lung: Secondary | ICD-10-CM | POA: Insufficient documentation

## 2018-06-18 DIAGNOSIS — Z95828 Presence of other vascular implants and grafts: Secondary | ICD-10-CM

## 2018-06-18 DIAGNOSIS — R253 Fasciculation: Secondary | ICD-10-CM

## 2018-06-18 LAB — COMPREHENSIVE METABOLIC PANEL
ALT: 26 U/L (ref 0–44)
AST: 14 U/L — ABNORMAL LOW (ref 15–41)
Albumin: 3.2 g/dL — ABNORMAL LOW (ref 3.5–5.0)
Alkaline Phosphatase: 101 U/L (ref 38–126)
Anion gap: 8 (ref 5–15)
BUN: 31 mg/dL — ABNORMAL HIGH (ref 6–20)
CO2: 22 mmol/L (ref 22–32)
Calcium: 10.2 mg/dL (ref 8.9–10.3)
Chloride: 106 mmol/L (ref 98–111)
Creatinine, Ser: 1.08 mg/dL — ABNORMAL HIGH (ref 0.44–1.00)
GFR calc Af Amer: >60 mL/min (ref >60)
GFR calc non Af Amer: >60 mL/min (ref >60)
Glucose, Bld: 135 mg/dL — ABNORMAL HIGH (ref 70–99)
Potassium: 3.4 mmol/L — ABNORMAL LOW (ref 3.5–5.1)
Sodium: 136 mmol/L (ref 135–145)
Total Bilirubin: <0.2 mg/dL — ABNORMAL LOW (ref 0.3–1.2)
Total Protein: 6.3 g/dL — ABNORMAL LOW (ref 6.5–8.1)

## 2018-06-18 LAB — CBC WITH DIFFERENTIAL/PLATELET
Basophils Absolute: 0 10*3/uL (ref 0.0–0.1)
Basophils Relative: 0 %
Eosinophils Absolute: 0 10*3/uL (ref 0.0–0.5)
Eosinophils Relative: 0 %
HCT: 29.6 % — ABNORMAL LOW (ref 34.8–46.6)
Hemoglobin: 9.8 g/dL — ABNORMAL LOW (ref 11.6–15.9)
Lymphocytes Relative: 10 %
Lymphs Abs: 1.1 10*3/uL (ref 0.9–3.3)
MCH: 27.9 pg (ref 25.1–34.0)
MCHC: 33.1 g/dL (ref 31.5–36.0)
MCV: 84.3 fL (ref 79.5–101.0)
Monocytes Absolute: 0.9 10*3/uL (ref 0.1–0.9)
Monocytes Relative: 8 %
Neutro Abs: 8.8 10*3/uL — ABNORMAL HIGH (ref 1.5–6.5)
Neutrophils Relative %: 82 %
Platelets: 124 10*3/uL — ABNORMAL LOW (ref 145–400)
RBC: 3.51 MIL/uL — ABNORMAL LOW (ref 3.70–5.45)
RDW: 20.3 % — ABNORMAL HIGH (ref 11.2–14.5)
WBC: 10.8 10*3/uL — ABNORMAL HIGH (ref 3.9–10.3)

## 2018-06-18 MED ORDER — VALACYCLOVIR HCL 1 G PO TABS: 1000.0000 mg | ORAL_TABLET | Freq: Every day | ORAL | 1 refills | Status: DC

## 2018-06-18 MED ORDER — SODIUM CHLORIDE 0.9% FLUSH
10.0000 mL | INTRAVENOUS | Status: DC | PRN
  Administered 2018-06-18: 10 mL
  Filled 2018-06-18: qty 10

## 2018-06-18 MED ORDER — SODIUM CHLORIDE 0.9 % IV SOLN
Freq: Once | INTRAVENOUS | Status: AC
  Administered 2018-06-18: 12:00:00 via INTRAVENOUS

## 2018-06-18 MED ORDER — SODIUM CHLORIDE 0.9 % IV SOLN
75.0000 mg/m2 | Freq: Once | INTRAVENOUS | Status: AC
  Administered 2018-06-18: 150 mg via INTRAVENOUS
  Filled 2018-06-18: qty 15

## 2018-06-18 MED ORDER — HEPARIN SOD (PORK) LOCK FLUSH 100 UNIT/ML IV SOLN
500.0000 [IU] | Freq: Once | INTRAVENOUS | Status: AC | PRN
  Administered 2018-06-18: 500 [IU]
  Filled 2018-06-18: qty 5

## 2018-06-18 MED ORDER — SODIUM CHLORIDE 0.9 % IV SOLN
623.5000 mg | Freq: Once | INTRAVENOUS | Status: AC
  Administered 2018-06-18: 620 mg via INTRAVENOUS
  Filled 2018-06-18: qty 62

## 2018-06-18 MED ORDER — FOSAPREPITANT DIMEGLUMINE INJECTION 150 MG
Freq: Once | INTRAVENOUS | Status: AC
  Administered 2018-06-18: 13:00:00 via INTRAVENOUS
  Filled 2018-06-18: qty 5

## 2018-06-18 MED ORDER — SODIUM CHLORIDE 0.9% FLUSH
10.0000 mL | Freq: Once | INTRAVENOUS | Status: AC
  Administered 2018-06-18: 10 mL
  Filled 2018-06-18: qty 10

## 2018-06-18 MED ORDER — PEGFILGRASTIM 6 MG/0.6ML ~~LOC~~ PSKT
6.0000 mg | PREFILLED_SYRINGE | Freq: Once | SUBCUTANEOUS | Status: AC
  Administered 2018-06-18: 6 mg via SUBCUTANEOUS

## 2018-06-18 MED ORDER — PEGFILGRASTIM 6 MG/0.6ML ~~LOC~~ PSKT
PREFILLED_SYRINGE | SUBCUTANEOUS | Status: AC
  Filled 2018-06-18: qty 0.6

## 2018-06-18 MED ORDER — DIPHENHYDRAMINE HCL 25 MG PO CAPS
25.0000 mg | ORAL_CAPSULE | Freq: Once | ORAL | Status: AC
  Administered 2018-06-18: 25 mg via ORAL

## 2018-06-18 MED ORDER — ACETAMINOPHEN 325 MG PO TABS
650.0000 mg | ORAL_TABLET | Freq: Once | ORAL | Status: AC
  Administered 2018-06-18: 650 mg via ORAL

## 2018-06-18 MED ORDER — PALONOSETRON HCL INJECTION 0.25 MG/5ML
INTRAVENOUS | Status: AC
  Filled 2018-06-18: qty 5

## 2018-06-18 MED ORDER — TRASTUZUMAB CHEMO 150 MG IV SOLR
6.0000 mg/kg | Freq: Once | INTRAVENOUS | Status: AC
  Administered 2018-06-18: 588 mg via INTRAVENOUS
  Filled 2018-06-18: qty 28

## 2018-06-18 MED ORDER — DIPHENHYDRAMINE HCL 25 MG PO CAPS
ORAL_CAPSULE | ORAL | Status: AC
  Filled 2018-06-18: qty 1

## 2018-06-18 MED ORDER — PALONOSETRON HCL INJECTION 0.25 MG/5ML
0.2500 mg | Freq: Once | INTRAVENOUS | Status: AC
  Administered 2018-06-18: 0.25 mg via INTRAVENOUS

## 2018-06-18 MED ORDER — SODIUM CHLORIDE 0.9% FLUSH
10.0000 mL | Freq: Once | INTRAVENOUS | Status: DC
  Filled 2018-06-18: qty 10

## 2018-06-18 MED ORDER — ACETAMINOPHEN 325 MG PO TABS
ORAL_TABLET | ORAL | Status: AC
  Filled 2018-06-18: qty 2

## 2018-06-18 NOTE — Patient Instructions (Signed)

## 2018-06-18 NOTE — Progress Notes (Addendum)
[place]  Telephone:(336) [phone] [phone]) [phone]     ID: [name] [name] DOB: [birth year]  MR#: [id number]  GYJ#:[id number]  Patient Care Team: Marda Stalker, PA-C as PCP - General (Family Medicine) Jerline Pain, MD as PCP - Cardiology (Cardiology) Magrinat, Virgie Dad, MD as Consulting Physician (Oncology) Loreta Ave, MD as Referring Physician (Internal Medicine) Donato Heinz, MD as Consulting Physician (Nephrology) Kyung Rudd, MD as Consulting Physician (Radiation Oncology) Jovita Kussmaul, MD as Consulting Physician (General Surgery) OTHER MD:  CHIEF COMPLAINT: Triple positive breast cancer  CURRENT TREATMENT: Neoadjuvant chemotherapy   HISTORY OF CURRENT ILLNESS: From the original intake note:  [name] [name] palpated a lump on 01/15/2018. She felt soreness and shooting pain under her arm and in the right breast. She underwent bilateral diagnostic mammography with tomography and right breast ultrasonography at [facility] on 03/12/2018 showing: breast density category C. The is architectural distortion at the 11 o'clock position. There is also an oval mass in the right breast anterior depth. Examination of the right axilla showed enlarged lymph nodes. Ultrasonography showed a 4.6 cm mass in the right breast upper outer quadrant posterior depth. An additional 1.2 cm oval mass in the right breast 12 o'clock middle depth. The lymph nodes in the right axillary are highly suggestive of malignancy.   Accordingly on 03/16/2018 she proceeded to biopsy of the right breast area in question. The pathology from this procedure showed ([id number]): At both the 11 and 12 o'clock positions: Invasive ductal carcinoma grade II. Ductal carcinoma in situ, high grade, with necrosis and calcifications. Prognostic indicators significant for: estrogen receptor, 90% positive and progesterone receptor, 40% positive, both with strong staining intensity. Proliferation marker Ki67 at 80%.  HER2 amplified with ratios HER2/CEP17 SIGNALS 6.90 and average HER2 copies per cell 14.50  The patient's subsequent history is as detailed below.  INTERVAL HISTORY: [name] returns today for follow-up and treatment of her triple positive breast cancer. She receives neoadjuvant chemotherapy with docetaxel, carboplatin, trastuzumab given every 21 days. Today is day 1 cycle 4 of 6 planned cycles given 21 days apart.   REVIEW OF SYSTEMS: [name] is doing well today.  She notes on days 4, 5, and 6 following her treatment she will experience about 6 episodes of diarrhea.  She takes the Questran bid, on these days.  She notes it improves after those days, and she notes that she is drinking 8 bottles of water per day (16 ounce).   [name] does have some mouth sores.  She describes it as an ulcer.  This started around day 3.  She is using biotene mouth rinse and ambesol for the pain.    She also notes that on the second day following treatment she will experience muscle twitching in her face.  She is unsure what this is from.    [name] denies any other issues such as fevers, chills, chest pain, palpitations, peripheral neuropathy, skin concerns, headaches, vision changes, focal weakness, shortness of breath, cough, leg swelling or any other issues.    PAST MEDICAL HISTORY: Past Medical History:  Diagnosis Date  . Anxiety   . Breast cancer, left breast ([place]) 2000   Underwent lumpectomy, chemotherapy and radiation  . Facial paralysis/Bells palsy    left side  . Family history of lung cancer   . Family history of lymphoma   . Hypertension   . Personal history of breast cancer 05/06/2018  . Renal disorder    just after TIA acute kidney injury  . TIA (  transient ischemic attack) 03/16/2017   [facility]   The patient has a previous history of left breast cancer diagnosed in 2000. She was seen by [facility] in [place], [place] under Dr. Loreta Ave. According to the patient, her left  breast cancer was stage II with no lymph node involvement (likely T2N0). She had chemotherapy every 3 weeks for about 6 months. She did not takes any "red drugs." She also did not take anti-estrogens (likely ER/PR negative).   TIA in 2018. She saw a neurologist as a precaution. She denies any clotting issues.   PAST SURGICAL HISTORY: Past Surgical History:  Procedure Laterality Date  . BREAST LUMPECTOMY WITH AXILLARY LYMPH NODE BIOPSY Left 2000   biopsy  . PORTACATH PLACEMENT Left 04/08/2018   Procedure: INSERTION PORT-A-CATH;  Surgeon: Jovita Kussmaul, MD;  Location: [facility];  Service: General;  Laterality: Left;  . TUBAL LIGATION Bilateral 2004  . WISDOM TOOTH EXTRACTION      FAMILY HISTORY Family History  Problem Relation Age of Onset  . Hypertension Mother   . Hypertension Father   . Diverticulosis Father   . Diabetes Father   . Lung cancer Father 14       hx smoking  . Hypertension Maternal Grandmother   . CAD Maternal Grandmother   . Alzheimer's disease Maternal Grandmother        died at 56  . Hypertension Paternal Grandmother   . CAD Paternal Grandmother   . Stroke Paternal Grandfather   . Sickle cell trait Paternal Grandfather   . Pneumonia Maternal Aunt        died at a young adult  . Lymphoma Paternal Aunt 44  The patient's father is alive at age 71 and had a history of lung cancer. The patient's mother is alive at age 29. The patient has 1 brother and no sisters. She denies a family history of breast or ovarian cancer.    GYNECOLOGIC HISTORY:  No LMP recorded. (Menstrual status: Perimenopausal). Menarche: 47 years old Age at first live birth: 47 years old She is GXP3. She is no longer having periods, which were irregular and light. Her LMP was  January 2019 and  December 2018. She is not having hot flashes. The patient used oral contraceptive from [phone] and the Depo-provera shot from [phone] with no complications. She never used HRT.    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 Her husband, [name], is a Administrator. The patient's son, Shea Stakes age 54, works at [employer] in [place]. The patient's daughters, Lilia Pro age 32, and Levonne Spiller age 36, are students. The patient's adopted son, Vonna Kotyk age 75, is also a Ship broker.      ADVANCED DIRECTIVES:    HEALTH MAINTENANCE: Social History   Tobacco Use  . Smoking status: Never Smoker  . Smokeless tobacco: Never Used  Substance Use Topics  . Alcohol use: No  . Drug use: No     Colonoscopy:   PAP: 2015  Bone density:   Allergies  Allergen Reactions  . Ace Inhibitors Swelling    Swelling of lips and face  . Lisinopril Swelling    Swelling of lips, face  . Amlodipine Swelling    Leg swelling  . Adhesive [Tape] Itching and Rash    Please use paper tape  . Latex Itching and Rash    Current Outpatient Medications  Medication Sig Dispense Refill  . acetaminophen (TYLENOL) 500 MG tablet Take 500 mg by mouth  every 6 (six) hours as needed for headache (pain).    Marland Kitchen aspirin EC 81 MG EC tablet Take 1 tablet (81 mg total) by mouth daily. 30 tablet 0  . BYSTOLIC 20 MG TABS TAKE 1 TABLET (20 MG TOTAL) BY MOUTH DAILY. 30 tablet 6  . cholestyramine (QUESTRAN) 4 GM/DOSE powder Take 1 packet (4 g total) by mouth 2 (two) times daily. 378 g 3  . cloNIDine (CATAPRES) 0.2 MG tablet Take 0.2 mg by mouth at bedtime.     . Cyanocobalamin (VITAMIN B12 PO) Take by mouth daily.    Marland Kitchen dexamethasone (DECADRON) 4 MG tablet Take 2 tablets (8 mg total) by mouth 2 (two) times daily. Start the day before Taxotere. Take once the day after, then 2 times a day x 2d. 30 tablet 1  . diphenoxylate-atropine (LOMOTIL) 2.5-0.025 MG tablet Take 1 tablet by mouth 4 (four) times daily as needed for diarrhea or loose stools. 30 tablet 1  . Ferrous Sulfate (IRON) 325 (65 Fe) MG TABS Take by mouth daily.    Marland Kitchen HYDROcodone-acetaminophen (NORCO/VICODIN) 5-325 MG tablet Take 1-2 tablets by mouth every 6 (six) hours  as needed for moderate pain or severe pain. 10 tablet 0  . lidocaine-prilocaine (EMLA) cream Apply to affected area once 30 g 3  . LORazepam (ATIVAN) 0.5 MG tablet TAKE 1 TABLET (0.5 MG TOTAL) BY MOUTH AT BEDTIME AS NEEDED (NAUSEA OR VOMITING). 30 tablet 0  . Multiple Vitamin (MULITIVITAMIN WITH MINERALS) TABS Take 1 tablet by mouth daily.      . prochlorperazine (COMPAZINE) 10 MG tablet Take 1 tablet (10 mg total) by mouth every 6 (six) hours as needed (Nausea or vomiting). 30 tablet 1  . spironolactone (ALDACTONE) 50 MG tablet TAKE 1 TABLET (50 MG TOTAL) BY MOUTH DAILY. 30 tablet 6  . ferrous sulfate 325 (65 FE) MG tablet Take 1 tablet (325 mg total) by mouth daily. 30 tablet 0   No current facility-administered medications for this visit.    Facility-Administered Medications Ordered in Other Visits  Medication Dose Route Frequency Provider Last Rate Last Dose  . sodium chloride flush (NS) 0.9 % injection 10 mL  10 mL Intracatheter Once Magrinat, Virgie Dad, MD        OBJECTIVE:   Vitals:   06/18/18 0925  BP: 140/84  Pulse: 82  Resp: 18  Temp: 98.8 F (37.1 C)  SpO2: 100%     Body mass index is 43.24 kg/m.   Wt Readings from Last 3 Encounters:  06/18/18 214 lb 1.6 oz (97.1 kg)  05/28/18 210 lb (95.3 kg)  05/07/18 212 lb 8 oz (96.4 kg)  ECOG FS:1 - Symptomatic but completely ambulatory GENERAL: Patient is a well appearing female in no acute distress HEENT:  Sclerae anicteric.  Oropharynx clear and moist. No ulcerations or evidence of oropharyngeal candidiasis. Neck is supple.  NODES:  No cervical, supraclavicular, or axillary lymphadenopathy palpated.  BREAST EXAM:  Deferred. LUNGS:  Clear to auscultation bilaterally.  No wheezes or rhonchi. HEART:  Regular rate and rhythm. No murmur appreciated. ABDOMEN:  Soft, nontender.  Positive, normoactive bowel sounds. No organomegaly palpated. MSK:  No focal spinal tenderness to palpation. Full range of motion bilaterally in the upper  extremities. EXTREMITIES:  No peripheral edema.   SKIN:  Clear with no obvious rashes or skin changes. No nail dyscrasia. NEURO:  Nonfocal. Well oriented.  Appropriate affect.    LAB RESULTS:  CMP     Component Value Date/Time  NA 135 05/28/2018 0736   K 4.1 05/28/2018 0736   CL 104 05/28/2018 0736   CO2 21 (L) 05/28/2018 0736   GLUCOSE 221 (H) 05/28/2018 0736   BUN 29 (H) 05/28/2018 0736   CREATININE 1.35 (H) 05/28/2018 0736   CREATININE 1.29 (H) 03/25/2018 1129   CALCIUM 11.1 (H) 05/28/2018 0736   PROT 7.4 05/28/2018 0736   ALBUMIN 3.7 05/28/2018 0736   AST 16 05/28/2018 0736   AST 22 03/25/2018 1129   ALT 28 05/28/2018 0736   ALT 29 03/25/2018 1129   ALKPHOS 119 05/28/2018 0736   BILITOT <0.2 (L) 05/28/2018 0736   BILITOT 0.3 03/25/2018 1129   GFRNONAA 46 (L) 05/28/2018 0736   GFRNONAA 49 (L) 03/25/2018 1129   GFRAA 54 (L) 05/28/2018 0736   GFRAA 57 (L) 03/25/2018 1129    No results found for: TOTALPROTELP, ALBUMINELP, A1GS, A2GS, BETS, BETA2SER, GAMS, MSPIKE, SPEI  No results found for: KPAFRELGTCHN, LAMBDASER, KAPLAMBRATIO  Lab Results  Component Value Date   WBC 8.6 05/28/2018   NEUTROABS 7.9 (H) 05/28/2018   HGB 11.5 (L) 05/28/2018   HCT 35.1 05/28/2018   MCV 84.3 05/28/2018   PLT 145 05/28/2018    _0 @  No results found for: LABCA2  No components found for: WUXLKG401  No results for input(s): INR in the last 168 hours.  No results found for: LABCA2  No results found for: UUV253  No results found for: GUY403  No results found for: KVQ259  No results found for: CA2729  No components found for: HGQUANT  No results found for: CEA1 / No results found for: CEA1   No results found for: AFPTUMOR  No results found for: CHROMOGRNA  No results found for: PSA1  No visits with results within 3 Day(s) from this visit.  Latest known visit with results is:  Appointment on 05/28/2018  Component Date Value Ref Range Status  . Sodium  05/28/2018 135  135 - 145 mmol/L Final   Please note reference intervals were recently updated.  . Potassium 05/28/2018 4.1  3.5 - 5.1 mmol/L Final  . Chloride 05/28/2018 104  98 - 111 mmol/L Final  . CO2 05/28/2018 21* 22 - 32 mmol/L Final  . Glucose, Bld 05/28/2018 221* 70 - 99 mg/dL Final  . BUN 05/28/2018 29* 6 - 20 mg/dL Final   Please note change in reference range.  . Creatinine, Ser 05/28/2018 1.35* 0.44 - 1.00 mg/dL Final  . Calcium 05/28/2018 11.1* 8.9 - 10.3 mg/dL Final  . Total Protein 05/28/2018 7.4  6.5 - 8.1 g/dL Final  . Albumin 05/28/2018 3.7  3.5 - 5.0 g/dL Final  . AST 05/28/2018 16  15 - 41 U/L Final  . ALT 05/28/2018 28  0 - 44 U/L Final  . Alkaline Phosphatase 05/28/2018 119  38 - 126 U/L Final  . Total Bilirubin 05/28/2018 <0.2* 0.3 - 1.2 mg/dL Final  . GFR calc non Af Amer 05/28/2018 46* >60 mL/min Final  . GFR calc Af Amer 05/28/2018 54* >60 mL/min Final   Comment: (NOTE) The eGFR has been calculated using the CKD EPI equation. This calculation has not been validated in all clinical situations. eGFR's persistently <60 mL/min signify possible Chronic Kidney Disease.   Georgiann Hahn gap 05/28/2018 10  5 - 15 Final   Performed at [facility], [place] [address] [place], [place] [address]  . WBC 05/28/2018 8.6  3.9 - 10.3 K/uL Final  . RBC 05/28/2018 4.16  3.70 -  5.45 MIL/uL Final  . Hemoglobin 05/28/2018 11.5* 11.6 - 15.9 g/dL Final  . HCT 05/28/2018 35.1  34.8 - 46.6 % Final  . MCV 05/28/2018 84.3  79.5 - 101.0 fL Final  . MCH 05/28/2018 27.7  25.1 - 34.0 pg Final  . MCHC 05/28/2018 32.8  31.5 - 36.0 g/dL Final  . RDW 05/28/2018 17.0* 11.2 - 14.5 % Final  . Platelets 05/28/2018 145  145 - 400 K/uL Final  . Neutrophils Relative % 05/28/2018 92  % Final  . Neutro Abs 05/28/2018 7.9* 1.5 - 6.5 K/uL Final  . Lymphocytes Relative 05/28/2018 6  % Final  . Lymphs Abs 05/28/2018 0.5* 0.9 - 3.3 K/uL Final  . Monocytes Relative 05/28/2018 1  %  Final  . Monocytes Absolute 05/28/2018 0.1  0.1 - 0.9 K/uL Final  . Eosinophils Relative 05/28/2018 0  % Final  . Eosinophils Absolute 05/28/2018 0.0  0.0 - 0.5 K/uL Final  . Basophils Relative 05/28/2018 1  % Final  . Basophils Absolute 05/28/2018 0.1  0.0 - 0.1 K/uL Final   Performed at [facility], [place] [address] [place], [place] [address]    (this displays the last labs from the last 3 days)  No results found for: TOTALPROTELP, ALBUMINELP, A1GS, A2GS, BETS, BETA2SER, GAMS, MSPIKE, SPEI (this displays SPEP labs)  No results found for: KPAFRELGTCHN, LAMBDASER, KAPLAMBRATIO (kappa/lambda light chains)  No results found for: HGBA, HGBA2QUANT, HGBFQUANT, HGBSQUAN (Hemoglobinopathy evaluation)   No results found for: LDH  No results found for: IRON, TIBC, IRONPCTSAT (Iron and TIBC)  No results found for: FERRITIN  Urinalysis    Component Value Date/Time   COLORURINE STRAW (A) 03/17/2017 0600   APPEARANCEUR CLEAR (A) 03/17/2017 0600   LABSPEC 1.031 (H) 03/17/2017 0600   PHURINE 5.0 03/17/2017 0600   GLUCOSEU NEGATIVE 03/17/2017 0600   HGBUR NEGATIVE 03/17/2017 0600   BILIRUBINUR NEGATIVE 03/17/2017 0600   KETONESUR NEGATIVE 03/17/2017 0600   PROTEINUR NEGATIVE 03/17/2017 0600   UROBILINOGEN 0.2 11/29/2011 1656   NITRITE NEGATIVE 03/17/2017 0600   LEUKOCYTESUR NEGATIVE 03/17/2017 0600     STUDIES: No results found.  ELIGIBLE FOR AVAILABLE RESEARCH PROTOCOL: no  ASSESSMENT: 47 y.o. [place], [place] woman  (1) status post left lumpectomy in 2000 for a (?) T2N0 breast cancer,  (a) status post adjuvant chemotherapy  (b) status post adjuvant radiation  (c) did not receive antiestrogens  (2) genetics testing 05/06/2018, results pending  (3) status post right breast biopsy 03/16/2018 for a clinical mT2-3 N2, anatomic stage III invasive ductal carcinoma, grade 2, triple positive, with an MIB-1 of 80%.  (a) bone scan and chest CT scan negative for  metastases except for a thoracic spine lytic lesion of concern  (4) neoadjuvant chemotherapy consisting of carboplatin, docetaxel, trastuzumab and Pertuzumab every 3 weeks x 6 starting 04/16/2018  (a) pertuzumab held beginning with cycle 2 because of diarrhea  (5) trastuzumab to be continued to a total of 6 months  (6) definitive surgery to follow  (7) adjuvant radiation to the right breast planned  (8) antiestrogens to start at the completion of local treatment  PLAN: [name] is doing moderately well today.  I reviewed her issues with Dr. Jana Hakim today who met with her also.  I numbered her issues for ease of addressing. Her labs are stable today and she will proceed with treatment with her Docetaxel, Carboplatin, Trastuzumab.  1. Diarrhea: She will take Questran BID, and add imodium on the days she has diarrhea.  She knows to increase her fluid intake on these days.   2. Muscle twitching: She will decrease her compazine to 80m every 6 hours as needed to help with this.    3. Oral ulcers: Added Valtrex 10071mper day.    4. Hypercalcemia:  We will check pth level.    [name] [name] return in one week for labs and f/u.  She knows to call for any other issues that may develop before the next visit.  A total of (30) minutes of face-to-face time was spent with this patient with greater than 50% of that time in counseling and care-coordination.   LiWilber BihariNP 06/18/18 9:49 AM Medical Oncology and Hematology [facility] AvenuevDeLand Southwest[place] [address]el. [phone]  Fax. [phone] ADDENDUM: KeBurundis tolerating treatment moderately well.  We have omitted the Perjeta and she now has very minimal diarrhea.  There is nothing to suggest C. difficile at present.  She has not developed any peripheral neuropathy symptoms so far so we are continuing as before, proceeding with cycle 4 today.  I think the muscle twitching she is experiencing may be due  to prochlorperazine and we are dropping the dose to 5 mg instead of 10.  She is also developing some mouth sores and I think Valtrex suppression, will be helpful.  This was instituted today as well.  I personally saw this patient and performed a substantive portion of this encounter with the listed APP documented above.   GuChauncey CruelMD Medical Oncology and Hematology CoGross[facility] [id number]vCity View[place] [address]el. [phone]  Fax. [phone]

## 2018-06-18 NOTE — Patient Instructions (Signed)
[facility] Discharge Instructions for Patients Receiving Chemotherapy  Today you received the following chemotherapy agents Herceptin,taxotere,Carboplatin   To help prevent nausea and vomiting after your treatment, we encourage you to take your nausea medication as directed  If you develop nausea and vomiting that is not controlled by your nausea medication, call the clinic.   BELOW ARE SYMPTOMS THAT SHOULD BE REPORTED IMMEDIATELY:  *FEVER GREATER THAN 100.5 F  *CHILLS WITH OR WITHOUT FEVER  NAUSEA AND VOMITING THAT IS NOT CONTROLLED WITH YOUR NAUSEA MEDICATION  *UNUSUAL SHORTNESS OF BREATH  *UNUSUAL BRUISING OR BLEEDING  TENDERNESS IN MOUTH AND THROAT WITH OR WITHOUT PRESENCE OF ULCERS  *URINARY PROBLEMS  *BOWEL PROBLEMS  UNUSUAL RASH Items with * indicate a potential emergency and should be followed up as soon as possible.  Feel free to call the clinic should you have any questions or concerns. The clinic phone number is [phone].  Please show the [place] [facility] at check-in to the Emergency Department and triage nurse.

## 2018-06-18 NOTE — Telephone Encounter (Signed)
Gave avs and calendar ° °

## 2018-06-25 ENCOUNTER — Inpatient Hospital Stay: Payer: 59

## 2018-06-25 ENCOUNTER — Inpatient Hospital Stay: Payer: 59 | Admitting: Adult Health

## 2018-06-25 VITALS — BP 144/86 | HR 78 | Temp 98.5°F | Resp 18 | Ht 59.0 in | Wt 204.5 lb

## 2018-06-25 DIAGNOSIS — C50411 Malignant neoplasm of upper-outer quadrant of right female breast: Secondary | ICD-10-CM | POA: Diagnosis not present

## 2018-06-25 DIAGNOSIS — R200 Anesthesia of skin: Secondary | ICD-10-CM

## 2018-06-25 DIAGNOSIS — Z801 Family history of malignant neoplasm of trachea, bronchus and lung: Secondary | ICD-10-CM

## 2018-06-25 DIAGNOSIS — C50811 Malignant neoplasm of overlapping sites of right female breast: Secondary | ICD-10-CM | POA: Diagnosis not present

## 2018-06-25 DIAGNOSIS — Z170 Estrogen receptor positive status [ER+]: Secondary | ICD-10-CM | POA: Diagnosis not present

## 2018-06-25 DIAGNOSIS — Z807 Family history of other malignant neoplasms of lymphoid, hematopoietic and related tissues: Secondary | ICD-10-CM

## 2018-06-25 LAB — COMPREHENSIVE METABOLIC PANEL
ALT: 23 U/L (ref 0–44)
AST: 13 U/L — ABNORMAL LOW (ref 15–41)
Albumin: 3.6 g/dL (ref 3.5–5.0)
Alkaline Phosphatase: 97 U/L (ref 38–126)
Anion gap: 9 (ref 5–15)
BUN: 36 mg/dL — ABNORMAL HIGH (ref 6–20)
CO2: 28 mmol/L (ref 22–32)
Calcium: 10.6 mg/dL — ABNORMAL HIGH (ref 8.9–10.3)
Chloride: 101 mmol/L (ref 98–111)
Creatinine, Ser: 1.47 mg/dL — ABNORMAL HIGH (ref 0.44–1.00)
GFR calc Af Amer: 48 mL/min — ABNORMAL LOW (ref >60)
GFR calc non Af Amer: 42 mL/min — ABNORMAL LOW (ref >60)
Glucose, Bld: 129 mg/dL — ABNORMAL HIGH (ref 70–99)
Potassium: 4.3 mmol/L (ref 3.5–5.1)
Sodium: 138 mmol/L (ref 135–145)
Total Bilirubin: 0.3 mg/dL (ref 0.3–1.2)
Total Protein: 6.7 g/dL (ref 6.5–8.1)

## 2018-06-25 LAB — CBC WITH DIFFERENTIAL/PLATELET
Basophils Absolute: 0 10*3/uL (ref 0.0–0.1)
Basophils Relative: 0 %
Eosinophils Absolute: 0 10*3/uL (ref 0.0–0.5)
Eosinophils Relative: 1 %
HCT: 33.6 % — ABNORMAL LOW (ref 34.8–46.6)
Hemoglobin: 11.3 g/dL — ABNORMAL LOW (ref 11.6–15.9)
Lymphocytes Relative: 34 %
Lymphs Abs: 1.1 10*3/uL (ref 0.9–3.3)
MCH: 28.6 pg (ref 25.1–34.0)
MCHC: 33.6 g/dL (ref 31.5–36.0)
MCV: 85.1 fL (ref 79.5–101.0)
Monocytes Absolute: 0.8 10*3/uL (ref 0.1–0.9)
Monocytes Relative: 23 %
Neutro Abs: 1.5 10*3/uL (ref 1.5–6.5)
Neutrophils Relative %: 42 %
Platelets: 122 10*3/uL — ABNORMAL LOW (ref 145–400)
RBC: 3.95 MIL/uL (ref 3.70–5.45)
RDW: 19.3 % — ABNORMAL HIGH (ref 11.2–14.5)
WBC: 3.4 10*3/uL — ABNORMAL LOW (ref 3.9–10.3)

## 2018-06-25 NOTE — Progress Notes (Signed)
[name]  [phone] Fax:([phone]) [phone]     ID: [name] [name] DOB: 12-19-1970  MR#: [id number]  WNI#:[id number]  Patient Care Team: Marda Stalker, PA-C as PCP - General (Family Medicine) Jerline Pain, MD as PCP - Cardiology (Cardiology) Magrinat, Virgie Dad, MD as Consulting Physician (Oncology) Loreta Ave, MD as Referring Physician (Internal Medicine) Donato Heinz, MD as Consulting Physician (Nephrology) Kyung Rudd, MD as Consulting Physician (Radiation Oncology) Jovita Kussmaul, MD as Consulting Physician (General Surgery) OTHER MD:  CHIEF COMPLAINT: Triple positive breast cancer  CURRENT TREATMENT: Neoadjuvant chemotherapy   HISTORY OF CURRENT ILLNESS: From the original intake note:  [name] [name] palpated a lump on 01/15/2018. She felt soreness and shooting pain under her arm and in the right breast. She underwent bilateral diagnostic mammography with tomography and right breast ultrasonography at [facility] on 03/12/2018 showing: breast density category C. The is architectural distortion at the 11 o'clock position. There is also an oval mass in the right breast anterior depth. Examination of the right axilla showed enlarged lymph nodes. Ultrasonography showed a 4.6 cm mass in the right breast upper outer quadrant posterior depth. An additional 1.2 cm oval mass in the right breast 12 o'clock middle depth. The lymph nodes in the right axillary are highly suggestive of malignancy.   Accordingly on 03/16/2018 she proceeded to biopsy of the right breast area in question. The pathology from this procedure showed ([id number]): At both the 11 and 12 o'clock positions: Invasive ductal carcinoma grade II. Ductal carcinoma in situ, high grade, with necrosis and calcifications. Prognostic indicators significant for: estrogen receptor, 90% positive and progesterone receptor, 40% positive, both with strong staining intensity. Proliferation marker Ki67 at 80%.  HER2 amplified with ratios HER2/CEP17 SIGNALS 6.90 and average HER2 copies per cell 14.50  The patient's subsequent history is as detailed below.  INTERVAL HISTORY: [name] returns today for follow-up and treatment of her triple positive breast cancer. She receives neoadjuvant chemotherapy with docetaxel, carboplatin, trastuzumab given every 21 days. Today is day 8 cycle 4 of 6 planned cycles given 21 days apart.   REVIEW OF SYSTEMS: [name] is doing well today. She had diarrhea on Monday.  She took two imodium tablets and this was improved.  Tuesday, it was manageable as well.  She hasn't had any other issues with diarrhea since then.  She had some muscle twitching, and decreasing the compazine and stress has helped.    She has had some numbness and tingling in her first and second digit fingertips.  She denies any motor deficits related to this.  She denies any oral ulcers.  She has not started on the valtrex. She is eating and drinking well, but not as much fluid intake today.    PAST MEDICAL HISTORY: Past Medical History:  Diagnosis Date  . Anxiety   . Breast cancer, left breast ([place]) 2000   Underwent lumpectomy, chemotherapy and radiation  . Facial paralysis/Bells palsy    left side  . Family history of lung cancer   . Family history of lymphoma   . Hypertension   . Personal history of breast cancer 05/06/2018  . Renal disorder    just after TIA acute kidney injury  . TIA (transient ischemic attack) 03/16/2017   [facility]   The patient has a previous history of left breast cancer diagnosed in 2000. She was seen by [facility] in [place], [place] under Dr. Loreta Ave. According to the patient, her left breast cancer was stage II  with no lymph node involvement (likely T2N0). She had chemotherapy every 3 weeks for about 6 months. She did not takes any "red drugs." She also did not take anti-estrogens (likely ER/PR negative).   TIA in 2018. She saw a neurologist as a  precaution. She denies any clotting issues.   PAST SURGICAL HISTORY: Past Surgical History:  Procedure Laterality Date  . BREAST LUMPECTOMY WITH AXILLARY LYMPH NODE BIOPSY Left 2000   biopsy  . PORTACATH PLACEMENT Left 04/08/2018   Procedure: INSERTION PORT-A-CATH;  Surgeon: Jovita Kussmaul, MD;  Location: [facility];  Service: General;  Laterality: Left;  . TUBAL LIGATION Bilateral 2004  . WISDOM TOOTH EXTRACTION      FAMILY HISTORY Family History  Problem Relation Age of Onset  . Hypertension Mother   . Hypertension Father   . Diverticulosis Father   . Diabetes Father   . Lung cancer Father 70       hx smoking  . Hypertension Maternal Grandmother   . CAD Maternal Grandmother   . Alzheimer's disease Maternal Grandmother        died at 37  . Hypertension Paternal Grandmother   . CAD Paternal Grandmother   . Stroke Paternal Grandfather   . Sickle cell trait Paternal Grandfather   . Pneumonia Maternal Aunt        died at a young adult  . Lymphoma Paternal Aunt 81  The patient's father is alive at age 32 and had a history of lung cancer. The patient's mother is alive at age 86. The patient has 1 brother and no sisters. She denies a family history of breast or ovarian cancer.    GYNECOLOGIC HISTORY:  No LMP recorded. (Menstrual status: Perimenopausal). Menarche: 47 years old Age at first live birth: 47 years old She is GXP3. She is no longer having periods, which were irregular and light. Her LMP was  January 2019 and  December 2018. She is not having hot flashes. The patient used oral contraceptive from [phone] and the Depo-provera shot from [phone] with no complications. She never used HRT.    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 Her husband, [name], is a Administrator. The patient's son, [name] [name] age 65, works at [employer] in [place]. The patient's daughters, [name] [name] age 30, and [name] [name] age 51, are students. The patient's adopted son, [name] [name] age  96, is also a Ship broker.      ADVANCED DIRECTIVES:    HEALTH MAINTENANCE: Social History   Tobacco Use  . Smoking status: Never Smoker  . Smokeless tobacco: Never Used  Substance Use Topics  . Alcohol use: No  . Drug use: No     Colonoscopy:   PAP: 2015  Bone density:   Allergies  Allergen Reactions  . Ace Inhibitors Swelling    Swelling of lips and face  . Lisinopril Swelling    Swelling of lips, face  . Amlodipine Swelling    Leg swelling  . Adhesive [Tape] Itching and Rash    Please use paper tape  . Latex Itching and Rash    Current Outpatient Medications  Medication Sig Dispense Refill  . acetaminophen (TYLENOL) 500 MG tablet Take 500 mg by mouth every 6 (six) hours as needed for headache (pain).    Marland Kitchen aspirin EC 81 MG EC tablet Take 1 tablet (81 mg total) by mouth daily. 30 tablet 0  . BYSTOLIC 20 MG TABS TAKE 1 TABLET (20 MG TOTAL) BY MOUTH DAILY. 30 tablet  6  . cholestyramine (QUESTRAN) 4 GM/DOSE powder Take 1 packet (4 g total) by mouth 2 (two) times daily. 378 g 3  . cloNIDine (CATAPRES) 0.2 MG tablet Take 0.2 mg by mouth at bedtime.     . Cyanocobalamin (VITAMIN B12 PO) Take by mouth daily.    Marland Kitchen dexamethasone (DECADRON) 4 MG tablet Take 2 tablets (8 mg total) by mouth 2 (two) times daily. Start the day before Taxotere. Take once the day after, then 2 times a day x 2d. 30 tablet 1  . diphenoxylate-atropine (LOMOTIL) 2.5-0.025 MG tablet Take 1 tablet by mouth 4 (four) times daily as needed for diarrhea or loose stools. 30 tablet 1  . Ferrous Sulfate (IRON) 325 (65 Fe) MG TABS Take by mouth daily.    Marland Kitchen HYDROcodone-acetaminophen (NORCO/VICODIN) 5-325 MG tablet Take 1-2 tablets by mouth every 6 (six) hours as needed for moderate pain or severe pain. 10 tablet 0  . lidocaine-prilocaine (EMLA) cream Apply to affected area once 30 g 3  . LORazepam (ATIVAN) 0.5 MG tablet TAKE 1 TABLET (0.5 MG TOTAL) BY MOUTH AT BEDTIME AS NEEDED (NAUSEA OR VOMITING). 30 tablet 0  .  Multiple Vitamin (MULITIVITAMIN WITH MINERALS) TABS Take 1 tablet by mouth daily.      . prochlorperazine (COMPAZINE) 10 MG tablet Take 1 tablet (10 mg total) by mouth every 6 (six) hours as needed (Nausea or vomiting). 30 tablet 1  . spironolactone (ALDACTONE) 50 MG tablet TAKE 1 TABLET (50 MG TOTAL) BY MOUTH DAILY. 30 tablet 6  . valACYclovir (VALTREX) 1000 MG tablet Take 1 tablet (1,000 mg total) by mouth daily. 30 tablet 1  . ferrous sulfate 325 (65 FE) MG tablet Take 1 tablet (325 mg total) by mouth daily. 30 tablet 0   No current facility-administered medications for this visit.     OBJECTIVE:   Vitals:   06/25/18 1453  BP: (!) 144/86  Pulse: 78  Resp: 18  Temp: 98.5 F (36.9 C)  SpO2: 100%     Body mass index is 41.3 kg/m.   Wt Readings from Last 3 Encounters:  06/25/18 204 lb 8 oz (92.8 kg)  06/18/18 214 lb 1.6 oz (97.1 kg)  05/28/18 210 lb (95.3 kg)  ECOG FS:1 - Symptomatic but completely ambulatory GENERAL: Patient is a well appearing female in no acute distress HEENT:  Sclerae anicteric.  Oropharynx clear and moist. No ulcerations or evidence of oropharyngeal candidiasis. Neck is supple.  NODES:  No cervical, supraclavicular, or axillary lymphadenopathy palpated.  BREAST EXAM:  Deferred. LUNGS:  Clear to auscultation bilaterally.  No wheezes or rhonchi. HEART:  Regular rate and rhythm. No murmur appreciated. ABDOMEN:  Soft, nontender.  Positive, normoactive bowel sounds. No organomegaly palpated. MSK:  No focal spinal tenderness to palpation. Full range of motion bilaterally in the upper extremities. EXTREMITIES:  No peripheral edema.   SKIN:  Clear with no obvious rashes or skin changes. No nail dyscrasia. NEURO:  Nonfocal. Well oriented.  Appropriate affect.    LAB RESULTS:  CMP     Component Value Date/Time   NA 136 06/18/2018 0940   K 3.4 (L) 06/18/2018 0940   CL 106 06/18/2018 0940   CO2 22 06/18/2018 0940   GLUCOSE 135 (H) 06/18/2018 0940   BUN 31  (H) 06/18/2018 0940   CREATININE 1.08 (H) 06/18/2018 0940   CREATININE 1.29 (H) 03/25/2018 1129   CALCIUM 10.2 06/18/2018 0940   PROT 6.3 (L) 06/18/2018 0940   ALBUMIN 3.2 (L) 06/18/2018  0940   AST 14 (L) 06/18/2018 0940   AST 22 03/25/2018 1129   ALT 26 06/18/2018 0940   ALT 29 03/25/2018 1129   ALKPHOS 101 06/18/2018 0940   BILITOT <0.2 (L) 06/18/2018 0940   BILITOT 0.3 03/25/2018 1129   GFRNONAA >60 06/18/2018 0940   GFRNONAA 49 (L) 03/25/2018 1129   GFRAA >60 06/18/2018 0940   GFRAA 57 (L) 03/25/2018 1129    No results found for: TOTALPROTELP, ALBUMINELP, A1GS, A2GS, BETS, BETA2SER, GAMS, MSPIKE, SPEI  No results found for: KPAFRELGTCHN, LAMBDASER, KAPLAMBRATIO  Lab Results  Component Value Date   WBC 3.4 (L) 06/25/2018   NEUTROABS 1.5 06/25/2018   HGB 11.3 (L) 06/25/2018   HCT 33.6 (L) 06/25/2018   MCV 85.1 06/25/2018   PLT 122 (L) 06/25/2018    '@LASTCHEMISTRY'$ @  No results found for: LABCA2  No components found for: QIONGE952  No results for input(s): INR in the last 168 hours.  No results found for: LABCA2  No results found for: WUX324  No results found for: MWN027  No results found for: OZD664  No results found for: CA2729  No components found for: HGQUANT  No results found for: CEA1 / No results found for: CEA1   No results found for: AFPTUMOR  No results found for: CHROMOGRNA  No results found for: PSA1  Appointment on 06/25/2018  Component Date Value Ref Range Status  . WBC 06/25/2018 3.4* 3.9 - 10.3 K/uL Final  . RBC 06/25/2018 3.95  3.70 - 5.45 MIL/uL Final  . Hemoglobin 06/25/2018 11.3* 11.6 - 15.9 g/dL Final  . HCT 06/25/2018 33.6* 34.8 - 46.6 % Final  . MCV 06/25/2018 85.1  79.5 - 101.0 fL Final  . MCH 06/25/2018 28.6  25.1 - 34.0 pg Final  . MCHC 06/25/2018 33.6  31.5 - 36.0 g/dL Final  . RDW 06/25/2018 19.3* 11.2 - 14.5 % Final  . Platelets 06/25/2018 122* 145 - 400 K/uL Final  . Neutrophils Relative % 06/25/2018 42  % Final  .  Neutro Abs 06/25/2018 1.5  1.5 - 6.5 K/uL Final  . Lymphocytes Relative 06/25/2018 34  % Final  . Lymphs Abs 06/25/2018 1.1  0.9 - 3.3 K/uL Final  . Monocytes Relative 06/25/2018 23  % Final  . Monocytes Absolute 06/25/2018 0.8  0.1 - 0.9 K/uL Final  . Eosinophils Relative 06/25/2018 1  % Final  . Eosinophils Absolute 06/25/2018 0.0  0.0 - 0.5 K/uL Final  . Basophils Relative 06/25/2018 0  % Final  . Basophils Absolute 06/25/2018 0.0  0.0 - 0.1 K/uL Final   Performed at [facility], [place] [address], [place], [place] [address]    (this displays the last labs from the last 3 days)  No results found for: TOTALPROTELP, ALBUMINELP, A1GS, A2GS, BETS, BETA2SER, GAMS, MSPIKE, SPEI (this displays SPEP labs)  No results found for: KPAFRELGTCHN, LAMBDASER, KAPLAMBRATIO (kappa/lambda light chains)  No results found for: HGBA, HGBA2QUANT, HGBFQUANT, HGBSQUAN (Hemoglobinopathy evaluation)   No results found for: LDH  No results found for: IRON, TIBC, IRONPCTSAT (Iron and TIBC)  No results found for: FERRITIN  Urinalysis    Component Value Date/Time   COLORURINE STRAW (A) 03/17/2017 0600   APPEARANCEUR CLEAR (A) 03/17/2017 0600   LABSPEC 1.031 (H) 03/17/2017 0600   PHURINE 5.0 03/17/2017 0600   GLUCOSEU NEGATIVE 03/17/2017 0600   HGBUR NEGATIVE 03/17/2017 0600   BILIRUBINUR NEGATIVE 03/17/2017 0600   KETONESUR NEGATIVE 03/17/2017 0600   PROTEINUR NEGATIVE 03/17/2017 0600  UROBILINOGEN 0.2 11/29/2011 1656   NITRITE NEGATIVE 03/17/2017 0600   LEUKOCYTESUR NEGATIVE 03/17/2017 0600     STUDIES: No results found.  ELIGIBLE FOR AVAILABLE RESEARCH PROTOCOL: no  ASSESSMENT: 47 y.o. [place] woman  (1) status post left lumpectomy in 2000 for a (?) T2N0 breast cancer,  (a) status post adjuvant chemotherapy  (b) status post adjuvant radiation  (c) did not receive antiestrogens  (2) genetics testing 05/06/2018, results pending  (3) status post right  breast biopsy 03/16/2018 for a clinical mT2-3 N2, anatomic stage III invasive ductal carcinoma, grade 2, triple positive, with an MIB-1 of 80%.  (a) bone scan and chest CT scan negative for metastases except for a thoracic spine lytic lesion of concern  (4) neoadjuvant chemotherapy consisting of carboplatin, docetaxel, trastuzumab and Pertuzumab every 3 weeks x 6 starting 04/16/2018  (a) pertuzumab held beginning with cycle 2 because of diarrhea  (5) trastuzumab to be continued to a total of 6 months  (6) definitive surgery to follow  (7) adjuvant radiation to the right breast planned  (8) antiestrogens to start at the completion of local treatment  PLAN: [name] is doing moderately well today.  It sounds like she tolerated treatment better with this cycle than she has with previous cycles.  This is good news.  I reviewed her labs with her today and they are stable.   It sounds like she has really focused on self care after this cycle and it has helped.  I recommended that she continue this.    [name] will return in 2 weeks for labs, f/u, and her next chemotherapy treatment.  She knows to call for any other issues that may develop before the next visit.  A total of (30) minutes of face-to-face time was spent with this patient with greater than 50% of that time in counseling and care-coordination.   Wilber Bihari, NP 06/25/18 3:07 PM Medical Oncology and Hematology [facility] [address] [place], [place] [address] Tel. [phone]    Fax. [phone]

## 2018-06-26 ENCOUNTER — Encounter: Payer: Self-pay | Admitting: Adult Health

## 2018-06-26 ENCOUNTER — Telehealth: Payer: Self-pay | Admitting: Adult Health

## 2018-06-26 LAB — PTH, INTACT AND CALCIUM
Calcium, Total (PTH): 10.2 mg/dL (ref 8.7–10.2)
PTH: 152 pg/mL — ABNORMAL HIGH (ref 15–65)

## 2018-06-26 NOTE — Telephone Encounter (Signed)
No 7/25 los or referrals

## 2018-06-28 NOTE — Progress Notes (Signed)
CARDIO-ONCOLOGY CLINIC CONSULT NOTE  Referring Physician: Dr. Jana Hakim  HPI:  [name] [name] is 47 y.o. female with h/o HTN, TIA, anxiety and left breast cancer (treated in 2000 at [facility]) who was diagnosed with triple-positive R breast cancer in 4/19. Referred by Dr. Jana Hakim for enrollment into the Cardio-Oncology program.  Cancer history/treatment plan:  (1) status post left lumpectomy in 2000 for a (?) T2N0 breast cancer,             (a) status post adjuvant chemotherapy including adriamycin             (b) status post adjuvant radiation             (c) did not receive antiestrogens  (2) status post right breast biopsy 03/16/2018 for a clinical mT2-3 N2, anatomic stage III invasive ductal carcinoma, grade 2, triple positive, with an MIB-1 of 80%.             (a) bone scan and chest CT scan negative for metastases except for a thoracic spine lytic lesion of concern  (3) neoadjuvant chemotherapy consisting of carboplatin, docetaxel, trastuzumab and Pertuzumab every 3 weeks x 6 starting 04/16/2018             (a) pertuzumab held beginning with cycle 2 because of diarrhea  (4) trastuzumab to be continued to a total of 6 months  (5) definitive surgery to follow  (6) adjuvant radiation to the right breast planned  (7) antiestrogens to start at the completion of local treatment   Echo 06/29/18 EF 65-70%  GLS -20.6% LS 9.5 cm/s  Echo 4/19 EF 60-65% mild LVH Grade II DD  Doing well. No CP or SOB. No ho. Heart disease. Tolerating Herceptin well. Has only 2 more doses of Herceptin left. Going to [employer]. Snores mildly.   Review of Systems: [y] = yes, [ ]  = no   General: Weight gain [ ] ; Weight loss [ ] ; Anorexia [ ] ; Fatigue [ ] ; Fever [ ] ; Chills [ ] ; Weakness [ ]   Cardiac: Chest pain/pressure [ ] ; Resting SOB [ ] ; Exertional SOB [ ] ; Orthopnea [ ] ; Pedal Edema [ ] ; Palpitations [ ] ; Syncope [ ] ; Presyncope [ ] ; Paroxysmal nocturnal dyspnea[ ]   Pulmonary: Cough [ ] ; Wheezing[  ]; Hemoptysis[ ] ; Sputum [ ] ; Snoring [ ]   GI: Vomiting[ ] ; Dysphagia[ ] ; Melena[ ] ; Hematochezia [ ] ; Heartburn[ ] ; Abdominal pain [ ] ; Constipation [ ] ; Diarrhea [ ] ; BRBPR [ ]   GU: Hematuria[ ] ; Dysuria [ ] ; Nocturia[ ]   Vascular: Pain in legs with walking [ ] ; Pain in feet with lying flat [ ] ; Non-healing sores [ ] ; Stroke [ ] ; TIA Blue.Reese ]; Slurred speech [ ] ;  Neuro: Headaches[ ] ; Vertigo[ ] ; Seizures[ ] ; Paresthesias[ ] ;Blurred vision [ ] ; Diplopia [ ] ; Vision changes [ ]   Ortho/Skin: Arthritis [ ] ; Joint pain [ ] ; Muscle pain [ ] ; Joint swelling [ ] ; Back Pain [ ] ; Rash [ ]   Psych: Depression[ ] ; Anxiety[y ]  Heme: Bleeding problems [ ] ; Clotting disorders [ ] ; Anemia [ ]   Endocrine: Diabetes [ ] ; Thyroid dysfunction[ ]    Past Medical History:  Diagnosis Date  . Anxiety   . Breast cancer, left breast ([place]) 2000   Underwent lumpectomy, chemotherapy and radiation  . Facial paralysis/Bells palsy    left side  . Family history of lung cancer   . Family history of lymphoma   . Hypertension   . Personal history of breast cancer 05/06/2018  .  Renal disorder    just after TIA acute kidney injury  . TIA (transient ischemic attack) 03/16/2017   [facility] [facility]    Current Outpatient Medications  Medication Sig Dispense Refill  . acetaminophen (TYLENOL) 500 MG tablet Take 500 mg by mouth every 6 (six) hours as needed for headache (pain).    Marland Kitchen aspirin EC 81 MG EC tablet Take 1 tablet (81 mg total) by mouth daily. 30 tablet 0  . BYSTOLIC 20 MG TABS TAKE 1 TABLET (20 MG TOTAL) BY MOUTH DAILY. 30 tablet 6  . cholestyramine (QUESTRAN) 4 GM/DOSE powder Take 1 packet (4 g total) by mouth 2 (two) times daily. 378 g 3  . cloNIDine (CATAPRES) 0.2 MG tablet Take 0.2 mg by mouth at bedtime.     . Cyanocobalamin (VITAMIN B12 PO) Take by mouth daily.    Marland Kitchen dexamethasone (DECADRON) 4 MG tablet Take 2 tablets (8 mg total) by mouth 2 (two) times daily. Start the day before Taxotere. Take once the day  after, then 2 times a day x 2d. 30 tablet 1  . diphenoxylate-atropine (LOMOTIL) 2.5-0.025 MG tablet Take 1 tablet by mouth 4 (four) times daily as needed for diarrhea or loose stools. 30 tablet 1  . Ferrous Sulfate (IRON) 325 (65 Fe) MG TABS Take by mouth daily.    . ferrous sulfate 325 (65 FE) MG tablet Take 1 tablet (325 mg total) by mouth daily. 30 tablet 0  . HYDROcodone-acetaminophen (NORCO/VICODIN) 5-325 MG tablet Take 1-2 tablets by mouth every 6 (six) hours as needed for moderate pain or severe pain. 10 tablet 0  . lidocaine-prilocaine (EMLA) cream Apply to affected area once 30 g 3  . LORazepam (ATIVAN) 0.5 MG tablet TAKE 1 TABLET (0.5 MG TOTAL) BY MOUTH AT BEDTIME AS NEEDED (NAUSEA OR VOMITING). 30 tablet 0  . Multiple Vitamin (MULITIVITAMIN WITH MINERALS) TABS Take 1 tablet by mouth daily.      . prochlorperazine (COMPAZINE) 10 MG tablet Take 1 tablet (10 mg total) by mouth every 6 (six) hours as needed (Nausea or vomiting). 30 tablet 1  . spironolactone (ALDACTONE) 50 MG tablet TAKE 1 TABLET (50 MG TOTAL) BY MOUTH DAILY. 30 tablet 6  . valACYclovir (VALTREX) 1000 MG tablet Take 1 tablet (1,000 mg total) by mouth daily. 30 tablet 1   No current facility-administered medications for this encounter.     Allergies  Allergen Reactions  . Ace Inhibitors Swelling    Swelling of lips and face  . Lisinopril Swelling    Swelling of lips, face  . Amlodipine Swelling    Leg swelling  . Adhesive [Tape] Itching and Rash    Please use paper tape  . Latex Itching and Rash      Social History   Socioeconomic History  . Marital status: Married    Spouse name: [name]  . Number of children: 3  . Years of education: 30  . 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 Financial resource strain: Not on file  . Food insecurity:    Worry: Not on file    Inability: Not on file  . Transportation needs:    Medical: Not on  file    Non-medical: Not on file  Tobacco Use  . Smoking status: Never Smoker  . Smokeless tobacco: Never Used  Substance and Sexual Activity  . Alcohol use: No  . Drug use: No  . Sexual  activity: Yes    Partners: Male    Birth control/protection: Surgical  Lifestyle  . Physical activity:    Days per week: Not on file    Minutes per session: Not on file  . Stress: Not on file  Relationships  . Social connections:    Talks on phone: Not on file    Gets together: Not on file    Attends religious service: Not on file    Active member of club or organization: Not on file    Attends meetings of clubs or organizations: Not on file    Relationship status: Not on file  . Intimate partner violence:    Fear of current or ex partner: Not on file    Emotionally abused: Not on file    Physically abused: Not on file    Forced sexual activity: Not on file  Other Topics Concern  . Not on file  Social History Narrative   Lives with family   No caffeine      Family History  Problem Relation Age of Onset  . Hypertension Mother   . Hypertension Father   . Diverticulosis Father   . Diabetes Father   . Lung cancer Father 73       hx smoking  . Hypertension Maternal Grandmother   . CAD Maternal Grandmother   . Alzheimer's disease Maternal Grandmother        died at 17  . Hypertension Paternal Grandmother   . CAD Paternal Grandmother   . Stroke Paternal Grandfather   . Sickle cell trait Paternal Grandfather   . Pneumonia Maternal Aunt        died at a young adult  . Lymphoma Paternal Aunt 74    Vitals:   06/29/18 1131  BP: 130/71  Pulse: 62  SpO2: 100%  Weight: 206 lb (93.4 kg)    PHYSICAL EXAM: General:  Well appearing. No respiratory difficulty HEENT: normal Neck: supple. no JVD. Carotids 2+ bilat; no bruits. No lymphadenopathy or thryomegaly appreciated. Cor: PMI nondisplaced. Regular rate & rhythm. 2/6 TR. Left port-a-cath is ok.  Lungs: clear Abdomen: obese soft,  nontender, nondistended. No hepatosplenomegaly. No bruits or masses. Good bowel sounds. Extremities: no cyanosis, clubbing, rash, edema Neuro: alert & oriented x 3, cranial nerves grossly intact. moves all 4 extremities w/o difficulty. Affect pleasant.   ASSESSMENT & PLAN: 1. Right Breast Cancer diagnosed 4/19 - triple positive - tolerating chemo well - Explained incidence of Herceptin cardiotoxicity (in setting of previous Adriamycin exposure can bue up to 31%) and role of Cardio-oncology clinic at length. Echo images reviewed personally. All parameters stable. EF 65% She finishes Herceptin in August. Will not need f/u.   2. HTN - mildly elevated. SBP in 130s today. Continue to follow with PCP.   Glori Bickers, MD  10:20 PM

## 2018-06-29 ENCOUNTER — Ambulatory Visit (HOSPITAL_COMMUNITY)
Admission: RE | Admit: 2018-06-29 | Discharge: 2018-06-29 | Disposition: A | Discharge status: Home / Self Care | Payer: 59 | Source: Ambulatory Visit | Attending: Internal Medicine | Admitting: Internal Medicine

## 2018-06-29 ENCOUNTER — Ambulatory Visit (HOSPITAL_BASED_OUTPATIENT_CLINIC_OR_DEPARTMENT_OTHER)
Admission: RE | Admit: 2018-06-29 | Discharge: 2018-06-29 | Disposition: A | Discharge status: Home / Self Care | Payer: 59 | Source: Ambulatory Visit | Attending: Family Medicine | Admitting: Family Medicine

## 2018-06-29 ENCOUNTER — Other Ambulatory Visit (HOSPITAL_COMMUNITY): Payer: 59

## 2018-06-29 VITALS — BP 130/71 | HR 62 | Wt 206.0 lb

## 2018-06-29 DIAGNOSIS — F419 Anxiety disorder, unspecified: Secondary | ICD-10-CM | POA: Insufficient documentation

## 2018-06-29 DIAGNOSIS — Z9221 Personal history of antineoplastic chemotherapy: Secondary | ICD-10-CM | POA: Diagnosis not present

## 2018-06-29 DIAGNOSIS — Z7982 Long term (current) use of aspirin: Secondary | ICD-10-CM | POA: Diagnosis not present

## 2018-06-29 DIAGNOSIS — Z833 Family history of diabetes mellitus: Secondary | ICD-10-CM | POA: Diagnosis not present

## 2018-06-29 DIAGNOSIS — Z8379 Family history of other diseases of the digestive system: Secondary | ICD-10-CM | POA: Diagnosis not present

## 2018-06-29 DIAGNOSIS — Z8249 Family history of ischemic heart disease and other diseases of the circulatory system: Secondary | ICD-10-CM | POA: Insufficient documentation

## 2018-06-29 DIAGNOSIS — Z170 Estrogen receptor positive status [ER+]: Secondary | ICD-10-CM | POA: Diagnosis not present

## 2018-06-29 DIAGNOSIS — C50411 Malignant neoplasm of upper-outer quadrant of right female breast: Secondary | ICD-10-CM

## 2018-06-29 DIAGNOSIS — Z8673 Personal history of transient ischemic attack (TIA), and cerebral infarction without residual deficits: Secondary | ICD-10-CM | POA: Diagnosis not present

## 2018-06-29 DIAGNOSIS — Z79899 Other long term (current) drug therapy: Secondary | ICD-10-CM | POA: Diagnosis not present

## 2018-06-29 DIAGNOSIS — Z91040 Latex allergy status: Secondary | ICD-10-CM | POA: Insufficient documentation

## 2018-06-29 DIAGNOSIS — I10 Essential (primary) hypertension: Secondary | ICD-10-CM | POA: Insufficient documentation

## 2018-06-29 DIAGNOSIS — Z923 Personal history of irradiation: Secondary | ICD-10-CM | POA: Diagnosis not present

## 2018-06-29 DIAGNOSIS — Z801 Family history of malignant neoplasm of trachea, bronchus and lung: Secondary | ICD-10-CM | POA: Diagnosis not present

## 2018-06-29 DIAGNOSIS — Z807 Family history of other malignant neoplasms of lymphoid, hematopoietic and related tissues: Secondary | ICD-10-CM | POA: Diagnosis not present

## 2018-06-29 DIAGNOSIS — Z888 Allergy status to other drugs, medicaments and biological substances status: Secondary | ICD-10-CM | POA: Diagnosis not present

## 2018-06-29 NOTE — Patient Instructions (Signed)
Follow up as needed, call [phone] if you have any questions.

## 2018-06-29 NOTE — Progress Notes (Signed)
  Echocardiogram 2D Echocardiogram has been performed.  Johny Chess 06/29/2018, 10:55 AM

## 2018-06-29 NOTE — Addendum Note (Signed)
Encounter addended by: Darron Doom, RN on: 06/29/2018 11:43 AM  Actions taken: Sign clinical note

## 2018-06-30 ENCOUNTER — Other Ambulatory Visit: Payer: Self-pay | Admitting: Oncology

## 2018-06-30 DIAGNOSIS — Z170 Estrogen receptor positive status [ER+]: Secondary | ICD-10-CM

## 2018-06-30 DIAGNOSIS — C50411 Malignant neoplasm of upper-outer quadrant of right female breast: Secondary | ICD-10-CM

## 2018-07-02 ENCOUNTER — Telehealth: Payer: Self-pay | Admitting: *Deleted

## 2018-07-02 NOTE — Telephone Encounter (Signed)
This RN reviewed communication from [facility] stating pt called on evening of 7/30 stating " caller states she is out of her diarrhea medication. The medication is Dexamethasone 4 mg prn - she would like a refill has enough for tonight "  This RN attempted to reach pt to discuss above symptom and clarify how she is taking the steroid.  Obtained VM on both work and mobile numbers per demographic page- this RN's name and office number given to return call.  Next scheduled appointment is 07/09/2018.  This RN called pt's pharmacy to inquire further on medications and what pt has recently picked up due to possible misunderstanding between dexamethasone and diphenozylate prescriptions.  Per pharmacist - dexamethasone was filled but not picked up - pt has refills on diphenoxylate but not filled .  Per above this RN stated pt will need refill on diphenoxylate ( instructions state use for diarrhea ).  This RN will await return call from pt to verify with her - her understanding of appropriate medications for symptoms.

## 2018-07-06 NOTE — Progress Notes (Signed)
[facility]  [phone] [phone]     ID: [name] [name] DOB: 1971/09/17  MR#: [id number]  PXT#:[id number]  Patient Care Team: Marda Stalker, PA-C as PCP - General (Family Medicine) Jerline Pain, MD as PCP - Cardiology (Cardiology) Magrinat, Virgie Dad, MD as Consulting Physician (Oncology) Loreta Ave, MD as Referring Physician (Internal Medicine) Donato Heinz, MD as Consulting Physician (Nephrology) Kyung Rudd, MD as Consulting Physician (Radiation Oncology) Jovita Kussmaul, MD as Consulting Physician (General Surgery) OTHER MD:  CHIEF COMPLAINT: Triple positive breast cancer  CURRENT TREATMENT: Neoadjuvant chemotherapy   HISTORY OF CURRENT ILLNESS: From the original intake note:  [name] Mazzaferro palpated a lump on 01/15/2018. She felt soreness and shooting pain under her arm and in the right breast. She underwent bilateral diagnostic mammography with tomography and right breast ultrasonography at [facility] on 03/12/2018 showing: breast density category C. The is architectural distortion at the 11 o'clock position. There is also an oval mass in the right breast anterior depth. Examination of the right axilla showed enlarged lymph nodes. Ultrasonography showed a 4.6 cm mass in the right breast upper outer quadrant posterior depth. An additional 1.2 cm oval mass in the right breast 12 o'clock middle depth. The lymph nodes in the right axillary are highly suggestive of malignancy.   Accordingly on 03/16/2018 she proceeded to biopsy of the right breast area in question. The pathology from this procedure showed ([id number]): At both the 11 and 12 o'clock positions: Invasive ductal carcinoma grade II. Ductal carcinoma in situ, high grade, with necrosis and calcifications. Prognostic indicators significant for: estrogen receptor, 90% positive and progesterone receptor, 40% positive, both with strong staining intensity. Proliferation marker Ki67 at 80%.  HER2 amplified with ratios HER2/CEP17 SIGNALS 6.90 and average HER2 copies per cell 14.50  The patient's subsequent history is as detailed below.  INTERVAL HISTORY: [name] returns today for follow-up and treatment of her triple positive breast cancer. She receives neoadjuvant chemotherapy with docetaxel, carboplatin, trastuzumab given every 21 days. Today is day 1 cycle 5 of 6 planned cycles given 21 days apart. She is tolerating this treatment method well thus far.   She had many questions today her regarding her lab work and these are discussed below.  REVIEW OF SYSTEMS: [name] reports that she hasn't had to use Rx Questrin or Lomotil for her diarrhea as of lately due to decrease in diarrhea occurrences. She voices concern for her recent elevated calcium levels. She reports that she takes Tylenol and Aleve combo once daily at bedtime when she has knee pain. She recently started consuming 52 ounces of water lately following her recent visit to one of her providers. She voices concern for what lab will show that chemo is working and that the cancer has left. She denies unusual headaches, visual changes, nausea, vomiting, or dizziness. There has been no unusual cough, phlegm production, or pleurisy. This been no change in bowel or bladder habits. She denies unexplained fatigue or unexplained weight loss, bleeding, rash, or fever. A detailed review of systems was otherwise stable.    PAST MEDICAL HISTORY: Past Medical History:  Diagnosis Date  . Anxiety   . Breast cancer, left breast ([place]) 2000   Underwent lumpectomy, chemotherapy and radiation  . Facial paralysis/Bells palsy    left side  . Family history of lung cancer   . Family history of lymphoma   . Hypertension   . Personal history of breast cancer 05/06/2018  . Renal disorder    just  after TIA acute kidney injury  . TIA (transient ischemic attack) 03/16/2017   [facility]   The patient has a previous history of left breast cancer  diagnosed in 2000. She was seen by [facility] [place], [place] under Dr. Loreta Ave. According to the patient, her left breast cancer was stage II with no lymph node involvement (likely T2N0). She had chemotherapy every 3 weeks for about 6 months. She did not takes any "red drugs." She also did not take anti-estrogens (likely ER/PR negative).   TIA in 2018. She saw a neurologist as a precaution. She denies any clotting issues.   PAST SURGICAL HISTORY: Past Surgical History:  Procedure Laterality Date  . BREAST LUMPECTOMY WITH AXILLARY LYMPH NODE BIOPSY Left 2000   biopsy  . PORTACATH PLACEMENT Left 04/08/2018   Procedure: INSERTION PORT-A-CATH;  Surgeon: Jovita Kussmaul, MD;  Location: [facility];  Service: General;  Laterality: Left;  . TUBAL LIGATION Bilateral 2004  . WISDOM TOOTH EXTRACTION      FAMILY HISTORY Family History  Problem Relation Age of Onset  . Hypertension Mother   . Hypertension Father   . Diverticulosis Father   . Diabetes Father   . Lung cancer Father 74       hx smoking  . Hypertension Maternal Grandmother   . CAD Maternal Grandmother   . Alzheimer's disease Maternal Grandmother        died at 58  . Hypertension Paternal Grandmother   . CAD Paternal Grandmother   . Stroke Paternal Grandfather   . Sickle cell trait Paternal Grandfather   . Pneumonia Maternal Aunt        died at a young adult  . Lymphoma Paternal Aunt 54  The patient's father is alive at age 47 and had a history of lung cancer. The patient's mother is alive at age 47. The patient has 1 brother and no sisters. She denies a family history of breast or ovarian cancer.    GYNECOLOGIC HISTORY:  No LMP recorded. (Menstrual status: Perimenopausal). Menarche: 47 years old Age at first live birth: 47 years old She is GXP3. She is no longer having periods, which were irregular and light. Her LMP was  January 2019 and  December 2018. She is not having hot flashes. The patient used oral  contraceptive from [phone] and the Depo-provera shot from [id number] with no complications. She never used HRT.    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 Her husband, [name], is a Administrator. The patient's son, Shea Stakes age 34, works at [employer] in [place]. The patient's daughters, Lilia Pro age 52, and Levonne Spiller age 54, are students. The patient's adopted son, Vonna Kotyk age 21, is also a Ship broker.      ADVANCED DIRECTIVES:    HEALTH MAINTENANCE: Social History   Tobacco Use  . Smoking status: Never Smoker  . Smokeless tobacco: Never Used  Substance Use Topics  . Alcohol use: No  . Drug use: No     Colonoscopy:   PAP: 2015  Bone density:   Allergies  Allergen Reactions  . Ace Inhibitors Swelling    Swelling of lips and face  . Lisinopril Swelling    Swelling of lips, face  . Amlodipine Swelling    Leg swelling  . Adhesive [Tape] Itching and Rash    Please use paper tape  . Latex Itching and Rash    Current Outpatient Medications  Medication Sig Dispense Refill  . acetaminophen (TYLENOL)  500 MG tablet Take 500 mg by mouth every 6 (six) hours as needed for headache (pain).    Marland Kitchen aspirin EC 81 MG EC tablet Take 1 tablet (81 mg total) by mouth daily. 30 tablet 0  . BYSTOLIC 20 MG TABS TAKE 1 TABLET (20 MG TOTAL) BY MOUTH DAILY. 30 tablet 6  . cholestyramine (QUESTRAN) 4 g packet Take 4 g by mouth 2 (two) times daily as needed.    . cloNIDine (CATAPRES) 0.2 MG tablet Take 0.2 mg by mouth daily as needed.    . Cyanocobalamin (VITAMIN B12 PO) Take by mouth daily.    Marland Kitchen dexamethasone (DECADRON) 4 MG tablet TAKE 2 TAB 2X DAILY. START THE DAY BEFORE TAXOTERE. TAKE ONCE THE DAY AFTER, THEN 2X A DAY X 2D. 30 tablet 1  . diphenoxylate-atropine (LOMOTIL) 2.5-0.025 MG tablet Take 1 tablet by mouth 4 (four) times daily as needed for diarrhea or loose stools. 30 tablet 1  . Ferrous Sulfate (IRON) 325 (65 Fe) MG TABS Take by mouth daily.    . ferrous sulfate  325 (65 FE) MG tablet Take 1 tablet (325 mg total) by mouth daily. 30 tablet 0  . lidocaine-prilocaine (EMLA) cream Apply to affected area once 30 g 3  . LORazepam (ATIVAN) 0.5 MG tablet TAKE 1 TABLET (0.5 MG TOTAL) BY MOUTH AT BEDTIME AS NEEDED (NAUSEA OR VOMITING). 30 tablet 0  . Multiple Vitamin (MULITIVITAMIN WITH MINERALS) TABS Take 1 tablet by mouth daily.      . prochlorperazine (COMPAZINE) 10 MG tablet Take 1 tablet (10 mg total) by mouth every 6 (six) hours as needed (Nausea or vomiting). 30 tablet 1  . spironolactone (ALDACTONE) 50 MG tablet TAKE 1 TABLET (50 MG TOTAL) BY MOUTH DAILY. 30 tablet 6   No current facility-administered medications for this visit.    Facility-Administered Medications Ordered in Other Visits  Medication Dose Route Frequency Provider Last Rate Last Dose  . sodium chloride flush (NS) 0.9 % injection 10 mL  10 mL Intracatheter Once Magrinat, Virgie Dad, MD        OBJECTIVE: Morbidly obese African-American woman in no acute distress  Vitals:   07/09/18 0837  BP: (!) 148/87  Pulse: 91  Resp: 18  Temp: 98.5 F (36.9 C)  SpO2: 99%     Body mass index is 43.97 kg/m.   Wt Readings from Last 3 Encounters:  07/09/18 217 lb 11.2 oz (98.7 kg)  06/29/18 206 lb (93.4 kg)  06/25/18 204 lb 8 oz (92.8 kg)  ECOG FS:1 - Symptomatic but completely ambulatory  Sclerae unicteric, EOMs intact Oropharynx clear and moist No cervical or supraclavicular adenopathy Lungs no rales or rhonchi Heart regular rate and rhythm Abd soft, nontender, positive bowel sounds MSK no focal spinal tenderness, no upper extremity lymphedema Neuro: nonfocal, well oriented, appropriate affect Breasts: I do not palpate a well-defined mass in her right breast.  The left breast is status post remote lumpectomy.  There is no evidence of local recurrence.  Both axillae are benign.    LAB RESULTS:  CMP     Component Value Date/Time   NA 138 06/25/2018 1425   K 4.3 06/25/2018 1425   CL  101 06/25/2018 1425   CO2 28 06/25/2018 1425   GLUCOSE 129 (H) 06/25/2018 1425   BUN 36 (H) 06/25/2018 1425   CREATININE 1.47 (H) 06/25/2018 1425   CREATININE 1.29 (H) 03/25/2018 1129   CALCIUM 10.2 06/25/2018 1425   CALCIUM 10.6 (H) 06/25/2018 1425  PROT 6.7 06/25/2018 1425   ALBUMIN 3.6 06/25/2018 1425   AST 13 (L) 06/25/2018 1425   AST 22 03/25/2018 1129   ALT 23 06/25/2018 1425   ALT 29 03/25/2018 1129   ALKPHOS 97 06/25/2018 1425   BILITOT 0.3 06/25/2018 1425   BILITOT 0.3 03/25/2018 1129   GFRNONAA 42 (L) 06/25/2018 1425   GFRNONAA 49 (L) 03/25/2018 1129   GFRAA 48 (L) 06/25/2018 1425   GFRAA 57 (L) 03/25/2018 1129    No results found for: TOTALPROTELP, ALBUMINELP, A1GS, A2GS, BETS, BETA2SER, GAMS, MSPIKE, SPEI  No results found for: KPAFRELGTCHN, LAMBDASER, KAPLAMBRATIO  Lab Results  Component Value Date   WBC 3.4 (L) 06/25/2018   NEUTROABS 1.5 06/25/2018   HGB 11.3 (L) 06/25/2018   HCT 33.6 (L) 06/25/2018   MCV 85.1 06/25/2018   PLT 122 (L) 06/25/2018    '@LASTCHEMISTRY'$ @  No results found for: LABCA2  No components found for: OXBDZH299  No results for input(s): INR in the last 168 hours.  No results found for: LABCA2  No results found for: MEQ683  No results found for: MHD622  No results found for: WLN989  No results found for: CA2729  No components found for: HGQUANT  No results found for: CEA1 / No results found for: CEA1   No results found for: AFPTUMOR  No results found for: CHROMOGRNA  No results found for: PSA1  No visits with results within 3 Day(s) from this visit.  Latest known visit with results is:  Appointment on 06/25/2018  Component Date Value Ref Range Status  . PTH 06/25/2018 152* 15 - 65 pg/mL Final  . Calcium, Total (PTH) 06/25/2018 10.2  8.7 - 10.2 mg/dL Final  . PTH Interp 06/25/2018 Comment   Final   Comment: (NOTE) Interpretation                 Intact PTH    Calcium                                (pg/mL)       (mg/dL) Normal                          15 - 65     8.6 - 10.2 Primary Hyperparathyroidism         >65          >10.2 Secondary Hyperparathyroidism       >65          <10.2 Non-Parathyroid Hypercalcemia       <65          >10.2 Hypoparathyroidism                  <15          < 8.6 Non-Parathyroid Hypocalcemia    15 - 65          < 8.6 Performed At: [facility] [place], [place] [id number] Rush Farmer MD CX:[id number]   . Sodium 06/25/2018 138  135 - 145 mmol/L Final  . Potassium 06/25/2018 4.3  3.5 - 5.1 mmol/L Final  . Chloride 06/25/2018 101  98 - 111 mmol/L Final  . CO2 06/25/2018 28  22 - 32 mmol/L Final  . Glucose, Bld 06/25/2018 129* 70 - 99 mg/dL Final  . BUN 06/25/2018 36* 6 - 20 mg/dL Final  . Creatinine, Ser 06/25/2018 1.47* 0.44 -  1.00 mg/dL Final  . Calcium 06/25/2018 10.6* 8.9 - 10.3 mg/dL Final  . Total Protein 06/25/2018 6.7  6.5 - 8.1 g/dL Final  . Albumin 06/25/2018 3.6  3.5 - 5.0 g/dL Final  . AST 06/25/2018 13* 15 - 41 U/L Final  . ALT 06/25/2018 23  0 - 44 U/L Final  . Alkaline Phosphatase 06/25/2018 97  38 - 126 U/L Final  . Total Bilirubin 06/25/2018 0.3  0.3 - 1.2 mg/dL Final  . GFR calc non Af Amer 06/25/2018 42* >60 mL/min Final  . GFR calc Af Amer 06/25/2018 48* >60 mL/min Final   Comment: (NOTE) The eGFR has been calculated using the CKD EPI equation. This calculation has not been validated in all clinical situations. eGFR's persistently <60 mL/min signify possible Chronic Kidney Disease.   Georgiann Hahn gap 06/25/2018 9  5 - 15 Final   Performed at [facility], [place] [address], [place], [place] [address]  . WBC 06/25/2018 3.4* 3.9 - 10.3 K/uL Final  . RBC 06/25/2018 3.95  3.70 - 5.45 MIL/uL Final  . Hemoglobin 06/25/2018 11.3* 11.6 - 15.9 g/dL Final  . HCT 06/25/2018 33.6* 34.8 - 46.6 % Final  . MCV 06/25/2018 85.1  79.5 - 101.0 fL Final  . MCH 06/25/2018 28.6  25.1 - 34.0 pg Final  . MCHC 06/25/2018  33.6  31.5 - 36.0 g/dL Final  . RDW 06/25/2018 19.3* 11.2 - 14.5 % Final  . Platelets 06/25/2018 122* 145 - 400 K/uL Final  . Neutrophils Relative % 06/25/2018 42  % Final  . Neutro Abs 06/25/2018 1.5  1.5 - 6.5 K/uL Final  . Lymphocytes Relative 06/25/2018 34  % Final  . Lymphs Abs 06/25/2018 1.1  0.9 - 3.3 K/uL Final  . Monocytes Relative 06/25/2018 23  % Final  . Monocytes Absolute 06/25/2018 0.8  0.1 - 0.9 K/uL Final  . Eosinophils Relative 06/25/2018 1  % Final  . Eosinophils Absolute 06/25/2018 0.0  0.0 - 0.5 K/uL Final  . Basophils Relative 06/25/2018 0  % Final  . Basophils Absolute 06/25/2018 0.0  0.0 - 0.1 K/uL Final   Performed at [facility], [place] [address] [place], [place] [address]    (this displays the last labs from the last 3 days)  No results found for: TOTALPROTELP, ALBUMINELP, A1GS, A2GS, BETS, BETA2SER, GAMS, MSPIKE, SPEI (this displays SPEP labs)  No results found for: KPAFRELGTCHN, LAMBDASER, KAPLAMBRATIO (kappa/lambda light chains)  No results found for: HGBA, HGBA2QUANT, HGBFQUANT, HGBSQUAN (Hemoglobinopathy evaluation)   No results found for: LDH  No results found for: IRON, TIBC, IRONPCTSAT (Iron and TIBC)  No results found for: FERRITIN  Urinalysis    Component Value Date/Time   COLORURINE STRAW (A) 03/17/2017 0600   APPEARANCEUR CLEAR (A) 03/17/2017 0600   LABSPEC 1.031 (H) 03/17/2017 0600   PHURINE 5.0 03/17/2017 0600   GLUCOSEU NEGATIVE 03/17/2017 0600   HGBUR NEGATIVE 03/17/2017 0600   BILIRUBINUR NEGATIVE 03/17/2017 0600   KETONESUR NEGATIVE 03/17/2017 0600   PROTEINUR NEGATIVE 03/17/2017 0600   UROBILINOGEN 0.2 11/29/2011 1656   NITRITE NEGATIVE 03/17/2017 0600   LEUKOCYTESUR NEGATIVE 03/17/2017 0600     STUDIES: No results found.  ELIGIBLE FOR AVAILABLE RESEARCH PROTOCOL: no  ASSESSMENT: 47 y.o. [place], [place] woman  (1) status post left lumpectomy in 2000 for a (?) T2N0 breast cancer,  (a) status  post adjuvant chemotherapy  (b) status post adjuvant radiation  (c) did not receive antiestrogens  (2) genetics  testing 05/06/2018, results pending  (3) status post right breast biopsy 03/16/2018 for a clinical mT2-3 N2, anatomic stage III invasive ductal carcinoma, grade 2, triple positive, with an MIB-1 of 80%.  (a) bone scan and chest CT scan negative for metastases except for a thoracic spine lytic lesion of concern  (4) neoadjuvant chemotherapy consisting of carboplatin, docetaxel, trastuzumab and Pertuzumab every 3 weeks x 6 starting 04/16/2018  (a) pertuzumab held beginning with cycle 2 because of diarrhea  (5) trastuzumab to be continued to a total of 6 months  (6) definitive surgery to follow  (7) adjuvant radiation to the right breast planned  (8) antiestrogens to start at the completion of local treatment  PLAN: [name] is tolerating her chemotherapy generally well and we are proceeding with cycle 5 of 6 today.  She has had no more problems with diarrhea since we stopped the pembrolizumab.  She does have a high calcium and we checked a parathyroid level which is high.  Today we spent quite a bit of time discussing hyperparathyroidism.  She understands she is going to need surgery for this.  She will be scheduled for breast surgery in September and perhaps both surgeries could be done at the same time or perhaps not.  I have asked her surgeon Dr. Marlou Starks for some guidance regarding this.  She tells me she reads the Bible every day and she gets a lot of strength and comfort particularly from the [place].  She will see me again in 3 weeks with her final chemotherapy dose.  In the meantime I have encouraged her to increase her hydration and to avoid nonsteroidals.  We will continue to follow her kidney function closely  She knows to call for any other issues that may develop before the next visit.  Magrinat, Virgie Dad, MD  07/09/18 9:21 AM Medical Oncology and Hematology [facility] [address] [place], [place] [address] Tel. [phone]    Fax. [phone]    I, Soijett Blue am acting as scribe for Dr. Sarajane Jews C. Magrinat.  I, Lurline Del MD, have reviewed the above documentation for accuracy and completeness, and I agree with the above.

## 2018-07-06 NOTE — Telephone Encounter (Signed)
Asked pt to call back to discuss genetic test results.

## 2018-07-07 ENCOUNTER — Other Ambulatory Visit: Payer: Self-pay | Admitting: Oncology

## 2018-07-09 ENCOUNTER — Inpatient Hospital Stay: Payer: 59

## 2018-07-09 ENCOUNTER — Inpatient Hospital Stay: Payer: 59 | Admitting: Oncology

## 2018-07-09 ENCOUNTER — Inpatient Hospital Stay: Payer: 59 | Attending: Oncology

## 2018-07-09 ENCOUNTER — Other Ambulatory Visit: Payer: Self-pay | Admitting: *Deleted

## 2018-07-09 DIAGNOSIS — Z5112 Encounter for antineoplastic immunotherapy: Secondary | ICD-10-CM | POA: Insufficient documentation

## 2018-07-09 DIAGNOSIS — C50411 Malignant neoplasm of upper-outer quadrant of right female breast: Secondary | ICD-10-CM | POA: Diagnosis not present

## 2018-07-09 DIAGNOSIS — Z170 Estrogen receptor positive status [ER+]: Secondary | ICD-10-CM

## 2018-07-09 DIAGNOSIS — M899 Disorder of bone, unspecified: Secondary | ICD-10-CM | POA: Diagnosis not present

## 2018-07-09 DIAGNOSIS — Z5111 Encounter for antineoplastic chemotherapy: Secondary | ICD-10-CM | POA: Insufficient documentation

## 2018-07-09 DIAGNOSIS — E213 Hyperparathyroidism, unspecified: Secondary | ICD-10-CM | POA: Diagnosis not present

## 2018-07-09 DIAGNOSIS — Z95828 Presence of other vascular implants and grafts: Secondary | ICD-10-CM

## 2018-07-09 DIAGNOSIS — C50811 Malignant neoplasm of overlapping sites of right female breast: Secondary | ICD-10-CM

## 2018-07-09 DIAGNOSIS — Z803 Family history of malignant neoplasm of breast: Secondary | ICD-10-CM

## 2018-07-09 DIAGNOSIS — Z807 Family history of other malignant neoplasms of lymphoid, hematopoietic and related tissues: Secondary | ICD-10-CM

## 2018-07-09 DIAGNOSIS — Z801 Family history of malignant neoplasm of trachea, bronchus and lung: Secondary | ICD-10-CM

## 2018-07-09 DIAGNOSIS — Z6841 Body Mass Index (BMI) 40.0 and over, adult: Secondary | ICD-10-CM

## 2018-07-09 LAB — CBC WITH DIFFERENTIAL/PLATELET
Basophils Absolute: 0 10*3/uL (ref 0.0–0.1)
Basophils Relative: 0 %
Eosinophils Absolute: 0 10*3/uL (ref 0.0–0.5)
Eosinophils Relative: 0 %
HCT: 29.6 % — ABNORMAL LOW (ref 34.8–46.6)
Hemoglobin: 9.7 g/dL — ABNORMAL LOW (ref 11.6–15.9)
Lymphocytes Relative: 7 %
Lymphs Abs: 0.7 10*3/uL — ABNORMAL LOW (ref 0.9–3.3)
MCH: 29.1 pg (ref 25.1–34.0)
MCHC: 32.8 g/dL (ref 31.5–36.0)
MCV: 88.9 fL (ref 79.5–101.0)
Monocytes Absolute: 0.4 10*3/uL (ref 0.1–0.9)
Monocytes Relative: 3 %
Neutro Abs: 10 10*3/uL — ABNORMAL HIGH (ref 1.5–6.5)
Neutrophils Relative %: 90 %
Platelets: 191 10*3/uL (ref 145–400)
RBC: 3.33 MIL/uL — ABNORMAL LOW (ref 3.70–5.45)
RDW: 22.7 % — ABNORMAL HIGH (ref 11.2–14.5)
WBC: 11.1 10*3/uL — ABNORMAL HIGH (ref 3.9–10.3)

## 2018-07-09 LAB — COMPREHENSIVE METABOLIC PANEL
ALT: 26 U/L (ref 0–44)
AST: 16 U/L (ref 15–41)
Albumin: 3.3 g/dL — ABNORMAL LOW (ref 3.5–5.0)
Alkaline Phosphatase: 84 U/L (ref 38–126)
Anion gap: 12 (ref 5–15)
BUN: 21 mg/dL — ABNORMAL HIGH (ref 6–20)
CO2: 24 mmol/L (ref 22–32)
Calcium: 10.8 mg/dL — ABNORMAL HIGH (ref 8.9–10.3)
Chloride: 103 mmol/L (ref 98–111)
Creatinine, Ser: 1.28 mg/dL — ABNORMAL HIGH (ref 0.44–1.00)
GFR calc Af Amer: 57 mL/min — ABNORMAL LOW (ref >60)
GFR calc non Af Amer: 49 mL/min — ABNORMAL LOW (ref >60)
Glucose, Bld: 168 mg/dL — ABNORMAL HIGH (ref 70–99)
Potassium: 4.4 mmol/L (ref 3.5–5.1)
Sodium: 139 mmol/L (ref 135–145)
Total Bilirubin: 0.3 mg/dL (ref 0.3–1.2)
Total Protein: 6.8 g/dL (ref 6.5–8.1)

## 2018-07-09 MED ORDER — PALONOSETRON HCL INJECTION 0.25 MG/5ML
INTRAVENOUS | Status: AC
  Filled 2018-07-09: qty 5

## 2018-07-09 MED ORDER — DIPHENHYDRAMINE HCL 25 MG PO CAPS
ORAL_CAPSULE | ORAL | Status: AC
  Filled 2018-07-09: qty 1

## 2018-07-09 MED ORDER — PALONOSETRON HCL INJECTION 0.25 MG/5ML
0.2500 mg | Freq: Once | INTRAVENOUS | Status: AC
  Administered 2018-07-09: 0.25 mg via INTRAVENOUS

## 2018-07-09 MED ORDER — SODIUM CHLORIDE 0.9 % IV SOLN
75.0000 mg/m2 | Freq: Once | INTRAVENOUS | Status: AC
  Administered 2018-07-09: 150 mg via INTRAVENOUS
  Filled 2018-07-09: qty 15

## 2018-07-09 MED ORDER — DIPHENHYDRAMINE HCL 25 MG PO CAPS
25.0000 mg | ORAL_CAPSULE | Freq: Once | ORAL | Status: AC
  Administered 2018-07-09: 25 mg via ORAL

## 2018-07-09 MED ORDER — SODIUM CHLORIDE 0.9 % IV SOLN
Freq: Once | INTRAVENOUS | Status: AC
  Administered 2018-07-09: 11:00:00 via INTRAVENOUS
  Filled 2018-07-09: qty 250

## 2018-07-09 MED ORDER — SODIUM CHLORIDE 0.9% FLUSH
10.0000 mL | Freq: Once | INTRAVENOUS | Status: AC
  Administered 2018-07-09: 10 mL
  Filled 2018-07-09: qty 10

## 2018-07-09 MED ORDER — ACETAMINOPHEN 325 MG PO TABS
650.0000 mg | ORAL_TABLET | Freq: Once | ORAL | Status: AC
  Administered 2018-07-09: 650 mg via ORAL

## 2018-07-09 MED ORDER — PEGFILGRASTIM 6 MG/0.6ML ~~LOC~~ PSKT
PREFILLED_SYRINGE | SUBCUTANEOUS | Status: AC
  Filled 2018-07-09: qty 0.6

## 2018-07-09 MED ORDER — SODIUM CHLORIDE 0.9% FLUSH
10.0000 mL | INTRAVENOUS | Status: DC | PRN
  Filled 2018-07-09: qty 10

## 2018-07-09 MED ORDER — TRASTUZUMAB CHEMO 150 MG IV SOLR
6.0000 mg/kg | Freq: Once | INTRAVENOUS | Status: AC
  Administered 2018-07-09: 588 mg via INTRAVENOUS
  Filled 2018-07-09: qty 28

## 2018-07-09 MED ORDER — ACETAMINOPHEN 325 MG PO TABS
ORAL_TABLET | ORAL | Status: AC
  Filled 2018-07-09: qty 2

## 2018-07-09 MED ORDER — HEPARIN SOD (PORK) LOCK FLUSH 100 UNIT/ML IV SOLN
500.0000 [IU] | Freq: Once | INTRAVENOUS | Status: DC | PRN
  Filled 2018-07-09: qty 5

## 2018-07-09 MED ORDER — FOSAPREPITANT DIMEGLUMINE INJECTION 150 MG
Freq: Once | INTRAVENOUS | Status: AC
  Administered 2018-07-09: 11:00:00 via INTRAVENOUS
  Filled 2018-07-09: qty 5

## 2018-07-09 MED ORDER — PEGFILGRASTIM 6 MG/0.6ML ~~LOC~~ PSKT
6.0000 mg | PREFILLED_SYRINGE | Freq: Once | SUBCUTANEOUS | Status: AC
  Administered 2018-07-09: 6 mg via SUBCUTANEOUS

## 2018-07-09 MED ORDER — SODIUM CHLORIDE 0.9 % IV SOLN
545.5000 mg | Freq: Once | INTRAVENOUS | Status: AC
  Administered 2018-07-09: 550 mg via INTRAVENOUS
  Filled 2018-07-09: qty 55

## 2018-07-09 NOTE — Patient Instructions (Signed)
[facility] Discharge Instructions for Patients Receiving Chemotherapy  Today you received the following chemotherapy agents Herceptin,taxotere,Carboplatin   To help prevent nausea and vomiting after your treatment, we encourage you to take your nausea medication as directed  If you develop nausea and vomiting that is not controlled by your nausea medication, call the clinic.   BELOW ARE SYMPTOMS THAT SHOULD BE REPORTED IMMEDIATELY:  *FEVER GREATER THAN 100.5 F  *CHILLS WITH OR WITHOUT FEVER  NAUSEA AND VOMITING THAT IS NOT CONTROLLED WITH YOUR NAUSEA MEDICATION  *UNUSUAL SHORTNESS OF BREATH  *UNUSUAL BRUISING OR BLEEDING  TENDERNESS IN MOUTH AND THROAT WITH OR WITHOUT PRESENCE OF ULCERS  *URINARY PROBLEMS  *BOWEL PROBLEMS  UNUSUAL RASH Items with * indicate a potential emergency and should be followed up as soon as possible.  Feel free to call the clinic should you have any questions or concerns. The clinic phone number is [phone].  Please show the [facility] at check-in to the Emergency Department and triage nurse.

## 2018-07-10 ENCOUNTER — Telehealth: Payer: Self-pay | Admitting: Oncology

## 2018-07-10 NOTE — Telephone Encounter (Signed)
No 8/8 los orders.

## 2018-07-15 ENCOUNTER — Telehealth: Payer: Self-pay | Admitting: Genetics

## 2018-07-15 ENCOUNTER — Encounter: Payer: Self-pay | Admitting: Genetics

## 2018-07-15 NOTE — Telephone Encounter (Signed)
Asked patient to call back to discuss genetic test results.

## 2018-07-27 ENCOUNTER — Ambulatory Visit (HOSPITAL_COMMUNITY)
Admission: RE | Admit: 2018-07-27 | Discharge: 2018-07-27 | Disposition: A | Discharge status: Home / Self Care | Payer: 59 | Source: Ambulatory Visit | Attending: Oncology | Admitting: Oncology

## 2018-07-27 DIAGNOSIS — Z5111 Encounter for antineoplastic chemotherapy: Secondary | ICD-10-CM | POA: Diagnosis not present

## 2018-07-27 DIAGNOSIS — C50411 Malignant neoplasm of upper-outer quadrant of right female breast: Secondary | ICD-10-CM | POA: Diagnosis not present

## 2018-07-27 DIAGNOSIS — C773 Secondary and unspecified malignant neoplasm of axilla and upper limb lymph nodes: Secondary | ICD-10-CM | POA: Insufficient documentation

## 2018-07-27 DIAGNOSIS — Z170 Estrogen receptor positive status [ER+]: Secondary | ICD-10-CM

## 2018-07-27 DIAGNOSIS — R222 Localized swelling, mass and lump, trunk: Secondary | ICD-10-CM | POA: Diagnosis not present

## 2018-07-27 DIAGNOSIS — C50911 Malignant neoplasm of unspecified site of right female breast: Secondary | ICD-10-CM | POA: Diagnosis not present

## 2018-07-27 MED ORDER — GADOBENATE DIMEGLUMINE 529 MG/ML IV SOLN
20.0000 mL | Freq: Once | INTRAVENOUS | Status: AC | PRN
  Administered 2018-07-27: 20 mL via INTRAVENOUS

## 2018-07-29 ENCOUNTER — Other Ambulatory Visit: Payer: Self-pay

## 2018-07-29 MED ORDER — POLYMYXIN B-TRIMETHOPRIM 10000-0.1 UNIT/ML-% OP SOLN: OPHTHALMIC | 0 refills | Status: DC

## 2018-07-29 NOTE — Progress Notes (Signed)
Pt called and lvm to request antibiotic for pink eye. Pt will be coming in tomorrow to see MD and infusion. Per Dr.Magrinat, ok to send in Polytrim ophthalmic antibiotic. LVM for pt to let her know that medication sent to pharmacy listed in epic. Call back number provided.

## 2018-07-29 NOTE — Progress Notes (Signed)
[place]  [phone] [phone]     ID: [name] [name] DOB: Dec 11, 1970  MR#: [id number]  LKG#:[id number]  Patient Care Team: Marda Stalker, PA-C as PCP - General (Family Medicine) Jerline Pain, MD as PCP - Cardiology (Cardiology) Magrinat, Virgie Dad, MD as Consulting Physician (Oncology) Loreta Ave, MD as Referring Physician (Internal Medicine) Donato Heinz, MD as Consulting Physician (Nephrology) Kyung Rudd, MD as Consulting Physician (Radiation Oncology) Jovita Kussmaul, MD as Consulting Physician (General Surgery) OTHER MD:  CHIEF COMPLAINT: Triple positive breast cancer  CURRENT TREATMENT: Neoadjuvant chemotherapy   HISTORY OF CURRENT ILLNESS: From the original intake note:  [name] [name] palpated a lump on 01/15/2018. She felt soreness and shooting pain under her arm and in the right breast. She underwent bilateral diagnostic mammography with tomography and right breast ultrasonography at [facility] on 03/12/2018 showing: breast density category C. The is architectural distortion at the 11 o'clock position. There is also an oval mass in the right breast anterior depth. Examination of the right axilla showed enlarged lymph nodes. Ultrasonography showed a 4.6 cm mass in the right breast upper outer quadrant posterior depth. An additional 1.2 cm oval mass in the right breast 12 o'clock middle depth. The lymph nodes in the right axillary are highly suggestive of malignancy.   Accordingly on 03/16/2018 she proceeded to biopsy of the right breast area in question. The pathology from this procedure showed ([id number]): At both the 11 and 12 o'clock positions: Invasive ductal carcinoma grade II. Ductal carcinoma in situ, high grade, with necrosis and calcifications. Prognostic indicators significant for: estrogen receptor, 90% positive and progesterone receptor, 40% positive, both with strong staining intensity. Proliferation marker Ki67 at 80%.  HER2 amplified with ratios HER2/CEP17 SIGNALS 6.90 and average HER2 copies per cell 14.50  The patient's subsequent history is as detailed below.  INTERVAL HISTORY: [name] returns today for follow-up and treatment of her triple positive breast cancer. She receives neoadjuvant chemotherapy with docetaxel, carboplatin, trastuzumab given every 21 days. Today is day 1 cycle 6 of 6 planned cycles followed by another 6 months of trastuzumab.  During cycle 5, she experienced significant issues with nausea, vomiting and diarrhea. She had abdominal pain, and tried drinking a baking soda solution to calm acid and burping. On day 8, she was going down the stairs and the next thing she remembers is that she blacked out and was on the floor at the bottom of the stairs. She had right leg soreness due to the fall. Her appetite is better, and she was able to eat soup and crackers, but she mostly had trouble keeping food down. She had insomnia, and tried Lorazepam, but this minimally helped.  She completed a bilateral breast MRI on 07/27/2018 showing breast density category A. Marked imaging response to neoadjuvant chemotherapy with an interval decrease in size and number of right breast masses, significantly improved metastatic right axillary adenopathy and less pronounced right chest wall mass. Possible small metastatic lymph node posterior to the right chest wall with interval visualization of a fatty hilum, compatible with imaging response to the neoadjuvant chemotherapy. No evidence of malignancy on the left   REVIEW OF SYSTEMS: She denies unusual headaches or visual changes. There has been no unusual cough, phlegm production, or pleurisy. There has been no change in bladder habits. She denies unexplained weight loss, bleeding, rash, or fever. A detailed review of systems was otherwise stable.    PAST MEDICAL HISTORY: Past Medical History:  Diagnosis Date  .  Anxiety   . Breast cancer, left breast ([place]) 2000    Underwent lumpectomy, chemotherapy and radiation  . Facial paralysis/Bells palsy    left side  . Family history of lung cancer   . Family history of lymphoma   . Hypertension   . Personal history of breast cancer 05/06/2018  . Renal disorder    just after TIA acute kidney injury  . TIA (transient ischemic attack) 03/16/2017   [place] [facility]   The patient has a previous history of left breast cancer diagnosed in 2000. She was seen by [facility] in [place], [place] under Dr. Loreta Ave. According to the patient, her left breast cancer was stage II with no lymph node involvement (likely T2N0). She had chemotherapy every 3 weeks for about 6 months. She did not takes any "red drugs." She also did not take anti-estrogens (likely ER/PR negative).   TIA in 2018. She saw a neurologist as a precaution. She denies any clotting issues.   PAST SURGICAL HISTORY: Past Surgical History:  Procedure Laterality Date  . BREAST LUMPECTOMY WITH AXILLARY LYMPH NODE BIOPSY Left 2000   biopsy  . PORTACATH PLACEMENT Left 04/08/2018   Procedure: INSERTION PORT-A-CATH;  Surgeon: Jovita Kussmaul, MD;  Location: [place];  Service: General;  Laterality: Left;  . TUBAL LIGATION Bilateral 2004  . WISDOM TOOTH EXTRACTION      FAMILY HISTORY Family History  Problem Relation Age of Onset  . Hypertension Mother   . Hypertension Father   . Diverticulosis Father   . Diabetes Father   . Lung cancer Father 56       hx smoking  . Hypertension Maternal Grandmother   . CAD Maternal Grandmother   . Alzheimer's disease Maternal Grandmother        died at 77  . Hypertension Paternal Grandmother   . CAD Paternal Grandmother   . Stroke Paternal Grandfather   . Sickle cell trait Paternal Grandfather   . Pneumonia Maternal Aunt        died at a young adult  . Lymphoma Paternal Aunt 73  The patient's father is alive at age 24 and had a history of lung cancer. The patient's mother is alive at age 28. The patient  has 1 brother and no sisters. She denies a family history of breast or ovarian cancer.    GYNECOLOGIC HISTORY:  No LMP recorded. (Menstrual status: Perimenopausal). Menarche: 47 years old Age at first live birth: 47 years old She is GXP3. She is no longer having periods, which were irregular and light. Her LMP was  January 2019 and  December 2018. She is not having hot flashes. The patient used oral contraceptive from [phone] and the Depo-provera shot from [phone] with no complications. She never used HRT.    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 Her husband, [name], is a Administrator. The patient's son, Shea Stakes age 12, works at [employer] in [place]. The patient's daughters, Lilia Pro age 58, and Levonne Spiller age 29, are students. The patient's adopted son, Vonna Kotyk age 76, is also a Ship broker.      ADVANCED DIRECTIVES:    HEALTH MAINTENANCE: Social History   Tobacco Use  . Smoking status: Never Smoker  . Smokeless tobacco: Never Used  Substance Use Topics  . Alcohol use: No  . Drug use: No     Colonoscopy:   PAP: 2015  Bone density:   Allergies  Allergen Reactions  . Ace Inhibitors Swelling  Swelling of lips and face  . Lisinopril Swelling    Swelling of lips, face  . Amlodipine Swelling    Leg swelling  . Adhesive [Tape] Itching and Rash    Please use paper tape  . Latex Itching and Rash    Current Outpatient Medications  Medication Sig Dispense Refill  . acetaminophen (TYLENOL) 500 MG tablet Take 500 mg by mouth every 6 (six) hours as needed for headache (pain).    Marland Kitchen aspirin EC 81 MG EC tablet Take 1 tablet (81 mg total) by mouth daily. 30 tablet 0  . BYSTOLIC 20 MG TABS TAKE 1 TABLET (20 MG TOTAL) BY MOUTH DAILY. 30 tablet 6  . cholestyramine (QUESTRAN) 4 g packet Take 4 g by mouth 2 (two) times daily as needed.    . cloNIDine (CATAPRES) 0.2 MG tablet Take 0.2 mg by mouth daily as needed.    . Cyanocobalamin (VITAMIN B12 PO) Take by  mouth daily.    Marland Kitchen dexamethasone (DECADRON) 4 MG tablet TAKE 2 TAB 2X DAILY. START THE DAY BEFORE TAXOTERE. TAKE ONCE THE DAY AFTER, THEN 2X A DAY X 2D. 30 tablet 1  . diphenoxylate-atropine (LOMOTIL) 2.5-0.025 MG tablet Take 1 tablet by mouth 4 (four) times daily as needed for diarrhea or loose stools. 30 tablet 1  . Ferrous Sulfate (IRON) 325 (65 Fe) MG TABS Take by mouth daily.    . ferrous sulfate 325 (65 FE) MG tablet Take 1 tablet (325 mg total) by mouth daily. 30 tablet 0  . lidocaine-prilocaine (EMLA) cream Apply to affected area once 30 g 3  . LORazepam (ATIVAN) 0.5 MG tablet TAKE 1 TABLET (0.5 MG TOTAL) BY MOUTH AT BEDTIME AS NEEDED (NAUSEA OR VOMITING). 30 tablet 0  . Multiple Vitamin (MULITIVITAMIN WITH MINERALS) TABS Take 1 tablet by mouth daily.      . prochlorperazine (COMPAZINE) 10 MG tablet Take 1 tablet (10 mg total) by mouth every 6 (six) hours as needed (Nausea or vomiting). 30 tablet 1  . spironolactone (ALDACTONE) 50 MG tablet TAKE 1 TABLET (50 MG TOTAL) BY MOUTH DAILY. 30 tablet 6  . trimethoprim-polymyxin b (POLYTRIM) ophthalmic solution Apply 2 eye drops on affected eye every 4 hrs. 10 mL 0   No current facility-administered medications for this visit.     OBJECTIVE: Morbidly obese African-American woman who appears stated age  47:   07/30/18 0827  BP: (!) 146/82  Pulse: 82  Resp: (!) 22  Temp: 98.2 F (36.8 C)  SpO2: 100%     Body mass index is 43.08 kg/m.   Wt Readings from Last 3 Encounters:  07/30/18 213 lb 4.8 oz (96.8 kg)  07/09/18 217 lb 11.2 oz (98.7 kg)  06/29/18 206 lb (93.4 kg)  ECOG FS:1 - Symptomatic but completely ambulatory  Sclerae unicteric, pupils round and equal Oropharynx clear and moist No cervical or supraclavicular adenopathy Lungs no rales or rhonchi Heart regular rate and rhythm Abd soft, nontender, positive bowel sounds MSK no focal spinal tenderness, no upper extremity lymphedema Neuro: nonfocal, well oriented, appropriate  affect Breasts: I do not palpate a well-defined mass in the right breast or in the right axilla.  There is no erythema.   LAB RESULTS:  CMP     Component Value Date/Time   NA 139 07/30/2018 0745   K 4.8 07/30/2018 0745   CL 105 07/30/2018 0745   CO2 23 07/30/2018 0745   GLUCOSE 201 (H) 07/30/2018 0745   BUN 27 (H) 07/30/2018 0745  CREATININE 1.99 (H) 07/30/2018 0745   CREATININE 1.29 (H) 03/25/2018 1129   CALCIUM 10.4 (H) 07/30/2018 0745   CALCIUM 10.2 06/25/2018 1425   PROT 6.8 07/30/2018 0745   ALBUMIN 3.6 07/30/2018 0745   AST 19 07/30/2018 0745   AST 22 03/25/2018 1129   ALT 32 07/30/2018 0745   ALT 29 03/25/2018 1129   ALKPHOS 92 07/30/2018 0745   BILITOT 0.3 07/30/2018 0745   BILITOT 0.3 03/25/2018 1129   GFRNONAA 29 (L) 07/30/2018 0745   GFRNONAA 49 (L) 03/25/2018 1129   GFRAA 34 (L) 07/30/2018 0745   GFRAA 57 (L) 03/25/2018 1129    No results found for: TOTALPROTELP, ALBUMINELP, A1GS, A2GS, BETS, BETA2SER, GAMS, MSPIKE, SPEI  No results found for: KPAFRELGTCHN, LAMBDASER, KAPLAMBRATIO  Lab Results  Component Value Date   WBC 6.5 07/30/2018   NEUTROABS 6.2 07/30/2018   HGB 9.5 (L) 07/30/2018   HCT 29.1 (L) 07/30/2018   MCV 93.4 07/30/2018   PLT 142 (L) 07/30/2018    '@LASTCHEMISTRY'$ @  No results found for: LABCA2  No components found for: XLKGMW102  No results for input(s): INR in the last 168 hours.  No results found for: LABCA2  No results found for: VOZ366  No results found for: YQI347  No results found for: QQV956  No results found for: CA2729  No components found for: HGQUANT  No results found for: CEA1 / No results found for: CEA1   No results found for: AFPTUMOR  No results found for: CHROMOGRNA  No results found for: PSA1  Appointment on 07/30/2018  Component Date Value Ref Range Status  . Sodium 07/30/2018 139  135 - 145 mmol/L Final  . Potassium 07/30/2018 4.8  3.5 - 5.1 mmol/L Final  . Chloride 07/30/2018 105  98 - 111  mmol/L Final  . CO2 07/30/2018 23  22 - 32 mmol/L Final  . Glucose, Bld 07/30/2018 201* 70 - 99 mg/dL Final  . BUN 07/30/2018 27* 6 - 20 mg/dL Final  . Creatinine, Ser 07/30/2018 1.99* 0.44 - 1.00 mg/dL Final  . Calcium 07/30/2018 10.4* 8.9 - 10.3 mg/dL Final  . Total Protein 07/30/2018 6.8  6.5 - 8.1 g/dL Final  . Albumin 07/30/2018 3.6  3.5 - 5.0 g/dL Final  . AST 07/30/2018 19  15 - 41 U/L Final  . ALT 07/30/2018 32  0 - 44 U/L Final  . Alkaline Phosphatase 07/30/2018 92  38 - 126 U/L Final  . Total Bilirubin 07/30/2018 0.3  0.3 - 1.2 mg/dL Final  . GFR calc non Af Amer 07/30/2018 29* >60 mL/min Final  . GFR calc Af Amer 07/30/2018 34* >60 mL/min Final   Comment: (NOTE) The eGFR has been calculated using the CKD EPI equation. This calculation has not been validated in all clinical situations. eGFR's persistently <60 mL/min signify possible Chronic Kidney Disease.   Georgiann Hahn gap 07/30/2018 11  5 - 15 Final   Performed at [facility], [place] [address] [place], [place] [address]  . WBC 07/30/2018 6.5  3.9 - 10.3 K/uL Final  . RBC 07/30/2018 3.12* 3.70 - 5.45 MIL/uL Final  . Hemoglobin 07/30/2018 9.5* 11.6 - 15.9 g/dL Final  . HCT 07/30/2018 29.1* 34.8 - 46.6 % Final  . MCV 07/30/2018 93.4  79.5 - 101.0 fL Final  . MCH 07/30/2018 30.5  25.1 - 34.0 pg Final  . MCHC 07/30/2018 32.7  31.5 - 36.0 g/dL Final  . RDW 07/30/2018 25.3* 11.2 - 14.5 % Final  .  Platelets 07/30/2018 142* 145 - 400 K/uL Final  . Neutrophils Relative % 07/30/2018 95  % Final  . Neutro Abs 07/30/2018 6.2  1.5 - 6.5 K/uL Final  . Lymphocytes Relative 07/30/2018 5  % Final  . Lymphs Abs 07/30/2018 0.3* 0.9 - 3.3 K/uL Final  . Monocytes Relative 07/30/2018 0  % Final  . Monocytes Absolute 07/30/2018 0.0* 0.1 - 0.9 K/uL Final  . Eosinophils Relative 07/30/2018 0  % Final  . Eosinophils Absolute 07/30/2018 0.0  0.0 - 0.5 K/uL Final  . Basophils Relative 07/30/2018 0  % Final  . Basophils  Absolute 07/30/2018 0.0  0.0 - 0.1 K/uL Final   Performed at [facility], [address] [place], [place] [address]    (this displays the last labs from the last 3 days)  No results found for: TOTALPROTELP, ALBUMINELP, A1GS, A2GS, BETS, BETA2SER, GAMS, MSPIKE, SPEI (this displays SPEP labs)  No results found for: KPAFRELGTCHN, LAMBDASER, KAPLAMBRATIO (kappa/lambda light chains)  No results found for: HGBA, HGBA2QUANT, HGBFQUANT, HGBSQUAN (Hemoglobinopathy evaluation)   No results found for: LDH  No results found for: IRON, TIBC, IRONPCTSAT (Iron and TIBC)  No results found for: FERRITIN  Urinalysis    Component Value Date/Time   COLORURINE STRAW (A) 03/17/2017 0600   APPEARANCEUR CLEAR (A) 03/17/2017 0600   LABSPEC 1.031 (H) 03/17/2017 0600   PHURINE 5.0 03/17/2017 0600   GLUCOSEU NEGATIVE 03/17/2017 0600   HGBUR NEGATIVE 03/17/2017 0600   BILIRUBINUR NEGATIVE 03/17/2017 0600   KETONESUR NEGATIVE 03/17/2017 0600   PROTEINUR NEGATIVE 03/17/2017 0600   UROBILINOGEN 0.2 11/29/2011 1656   NITRITE NEGATIVE 03/17/2017 0600   LEUKOCYTESUR NEGATIVE 03/17/2017 0600     STUDIES: Mr Breast Bilateral W Wo Contrast Inc Cad  Result Date: 07/28/2018 CLINICAL DATA:  Follow-up right breast cancer treated with neoadjuvant chemotherapy. LABS:  None obtained on site today. EXAM: BILATERAL BREAST MRI WITH AND WITHOUT CONTRAST TECHNIQUE: Multiplanar, multisequence MR images of both breasts were obtained prior to and following the intravenous administration of 20 ml of MultiHance. Three-dimensional MR images were rendered by post-processing the original MR data using the DynaCAD thin client. The 3D MR images are interpreted and the findings are included in the complete MRI report below. COMPARISON:  03/31/2018. FINDINGS: Breast composition: a. Almost entirely fat. Background parenchymal enhancement: Minimal. Right breast: 2.0 x 1.5 cm irregular enhancing mass in the  posterior to midportion of the upper-outer quadrant of the right breast. The mass previously demonstrated at that location measured 5.1 x 4.2 cm in corresponding dimensions previously. 0.7 x 0.6 cm irregular enhancing mass in the posterior aspect of the upper-outer quadrant of the right breast at the location of a previously biopsied conglomeration of metastatic nodes. No other enhancing masses are seen in the right breast at this time. The previously seen enhancing mass in right pectoralis muscle and chest wall is smaller without a discrete mass visible at this time. There is residual enhancement in that portion of the chest wall. Left breast: No mass or abnormal enhancement. Lymph nodes: 3 right axillary lymph nodes with mild cortical thickening. The node with the most cortical thickening has a cortical thickness of 5 mm. The other more significantly enlarged lymph nodes previously demonstrated are no longer seen and the currently demonstrated lymph nodes are smaller. There is a mildly enlarged lymph node posterior to the anterior chest wall on the right, at the level of the upper liver, measuring 6 x 4 mm in maximum dimensions.  This is unchanged in overall size with an interval fatty hilum, not previously visible. There is a smaller adjacent lymph node at that location as well. Ancillary findings:  None. IMPRESSION: 1. Marked imaging response to neoadjuvant chemotherapy with an interval decrease in size and number of right breast masses, significantly improved metastatic right axillary adenopathy and less pronounced right chest wall mass. 2. Possible small metastatic lymph node posterior to the right chest wall with interval visualization of a fatty hilum, compatible with imaging response to the neoadjuvant chemotherapy. 3. No evidence of malignancy on the left. RECOMMENDATION: Treatment plan. BI-RADS CATEGORY  6: Known biopsy-proven malignancy. Electronically Signed   By: Claudie Revering M.D.   On: 07/28/2018 13:12     ELIGIBLE FOR AVAILABLE RESEARCH PROTOCOL: no  ASSESSMENT: 47 y.o. [place], [place] woman  (1) status post left lumpectomy in 2000 for a (?) T2N0 breast cancer,  (a) status post adjuvant chemotherapy  (b) status post adjuvant radiation  (c) did not receive antiestrogens  (2) genetics testing 05/06/2018, results pending  (3) status post right breast biopsy 03/16/2018 for a clinical mT2-3 N2, anatomic stage III invasive ductal carcinoma, grade 2, triple positive, with an MIB-1 of 80%.  (a) bone scan and chest CT scan negative for metastases except for a thoracic spine lytic lesion of concern  (4) neoadjuvant chemotherapy consisting of carboplatin, docetaxel, trastuzumab and Pertuzumab every 3 weeks x 6 starting 04/16/2018  (a) pertuzumab held beginning with cycle 2 because of diarrhea  (5) trastuzumab to be continued to a total of 6 months  (a) echocardiogram on 06/29/2018 showed an ejection fraction in the 65-70% range  (6) definitive surgery to follow  (7) adjuvant radiation to the right breast planned  (8) antiestrogens to start at the completion of local treatment  PLAN: [name] i will complete her chemotherapy today.  Her immunotherapy will continue through November.  Those orders are being entered.  She will need a repeat echocardiogram sometime in October  Today we discussed supportive care.  I am bringing her in this Saturday for fluids and she will see me on 08/06/2018 just to make sure she tolerated this last cycle without event.  She is already scheduled to see Dr. Marlou Starks 08/11/2018.  He will set her up for surgery at his discretion and she of course will need parathyroidectomy at some point, which he will also perform.  She will have her next trastuzumab treatment on 08/20/2018.  We will then see her with her next cycle which will be 10/10 2019.  By then we should have her surgical results.  She knows to call for any problems that may develop before the next  visit.  Magrinat, Virgie Dad, MD  07/30/18 8:48 AM Medical Oncology and Hematology [facility] [address] [place], [place] [address] Tel. [phone]    Fax. [phone]  Alice Rieger, am acting as scribe for Chauncey Cruel MD.  I, Lurline Del MD, have reviewed the above documentation for accuracy and completeness, and I agree with the above.

## 2018-07-30 ENCOUNTER — Inpatient Hospital Stay: Payer: 59

## 2018-07-30 ENCOUNTER — Other Ambulatory Visit: Payer: Self-pay | Admitting: Oncology

## 2018-07-30 ENCOUNTER — Encounter: Payer: Self-pay | Admitting: *Deleted

## 2018-07-30 ENCOUNTER — Inpatient Hospital Stay: Payer: 59 | Admitting: Oncology

## 2018-07-30 ENCOUNTER — Telehealth: Payer: Self-pay | Admitting: Oncology

## 2018-07-30 VITALS — BP 146/82 | HR 82 | Temp 98.2°F | Resp 22 | Ht 59.0 in | Wt 213.3 lb

## 2018-07-30 DIAGNOSIS — C50411 Malignant neoplasm of upper-outer quadrant of right female breast: Secondary | ICD-10-CM

## 2018-07-30 DIAGNOSIS — Z95828 Presence of other vascular implants and grafts: Secondary | ICD-10-CM

## 2018-07-30 DIAGNOSIS — Z170 Estrogen receptor positive status [ER+]: Secondary | ICD-10-CM

## 2018-07-30 DIAGNOSIS — Z807 Family history of other malignant neoplasms of lymphoid, hematopoietic and related tissues: Secondary | ICD-10-CM

## 2018-07-30 DIAGNOSIS — Z801 Family history of malignant neoplasm of trachea, bronchus and lung: Secondary | ICD-10-CM

## 2018-07-30 DIAGNOSIS — C50811 Malignant neoplasm of overlapping sites of right female breast: Secondary | ICD-10-CM | POA: Diagnosis not present

## 2018-07-30 DIAGNOSIS — M899 Disorder of bone, unspecified: Secondary | ICD-10-CM

## 2018-07-30 DIAGNOSIS — Z5111 Encounter for antineoplastic chemotherapy: Secondary | ICD-10-CM | POA: Diagnosis not present

## 2018-07-30 LAB — CBC WITH DIFFERENTIAL/PLATELET
Basophils Absolute: 0 10*3/uL (ref 0.0–0.1)
Basophils Relative: 0 %
Eosinophils Absolute: 0 10*3/uL (ref 0.0–0.5)
Eosinophils Relative: 0 %
HCT: 29.1 % — ABNORMAL LOW (ref 34.8–46.6)
Hemoglobin: 9.5 g/dL — ABNORMAL LOW (ref 11.6–15.9)
Lymphocytes Relative: 5 %
Lymphs Abs: 0.3 10*3/uL — ABNORMAL LOW (ref 0.9–3.3)
MCH: 30.5 pg (ref 25.1–34.0)
MCHC: 32.7 g/dL (ref 31.5–36.0)
MCV: 93.4 fL (ref 79.5–101.0)
Monocytes Absolute: 0 10*3/uL — ABNORMAL LOW (ref 0.1–0.9)
Monocytes Relative: 0 %
Neutro Abs: 6.2 10*3/uL (ref 1.5–6.5)
Neutrophils Relative %: 95 %
Platelets: 142 10*3/uL — ABNORMAL LOW (ref 145–400)
RBC: 3.12 MIL/uL — ABNORMAL LOW (ref 3.70–5.45)
RDW: 25.3 % — ABNORMAL HIGH (ref 11.2–14.5)
WBC: 6.5 10*3/uL (ref 3.9–10.3)

## 2018-07-30 LAB — COMPREHENSIVE METABOLIC PANEL
ALT: 32 U/L (ref 0–44)
AST: 19 U/L (ref 15–41)
Albumin: 3.6 g/dL (ref 3.5–5.0)
Alkaline Phosphatase: 92 U/L (ref 38–126)
Anion gap: 11 (ref 5–15)
BUN: 27 mg/dL — ABNORMAL HIGH (ref 6–20)
CO2: 23 mmol/L (ref 22–32)
Calcium: 10.4 mg/dL — ABNORMAL HIGH (ref 8.9–10.3)
Chloride: 105 mmol/L (ref 98–111)
Creatinine, Ser: 1.99 mg/dL — ABNORMAL HIGH (ref 0.44–1.00)
GFR calc Af Amer: 34 mL/min — ABNORMAL LOW (ref >60)
GFR calc non Af Amer: 29 mL/min — ABNORMAL LOW (ref >60)
Glucose, Bld: 201 mg/dL — ABNORMAL HIGH (ref 70–99)
Potassium: 4.8 mmol/L (ref 3.5–5.1)
Sodium: 139 mmol/L (ref 135–145)
Total Bilirubin: 0.3 mg/dL (ref 0.3–1.2)
Total Protein: 6.8 g/dL (ref 6.5–8.1)

## 2018-07-30 MED ORDER — ACETAMINOPHEN 325 MG PO TABS
650.0000 mg | ORAL_TABLET | Freq: Once | ORAL | Status: AC
  Administered 2018-07-30: 650 mg via ORAL

## 2018-07-30 MED ORDER — SODIUM CHLORIDE 0.9 % IV SOLN
Freq: Once | INTRAVENOUS | Status: AC
  Administered 2018-07-30: 11:00:00 via INTRAVENOUS
  Filled 2018-07-30: qty 5

## 2018-07-30 MED ORDER — PEGFILGRASTIM 6 MG/0.6ML ~~LOC~~ PSKT
PREFILLED_SYRINGE | SUBCUTANEOUS | Status: AC
  Filled 2018-07-30: qty 0.6

## 2018-07-30 MED ORDER — PALONOSETRON HCL INJECTION 0.25 MG/5ML
INTRAVENOUS | Status: AC
  Filled 2018-07-30: qty 5

## 2018-07-30 MED ORDER — DIPHENHYDRAMINE HCL 25 MG PO CAPS
25.0000 mg | ORAL_CAPSULE | Freq: Once | ORAL | Status: AC
  Administered 2018-07-30: 25 mg via ORAL

## 2018-07-30 MED ORDER — SODIUM CHLORIDE 0.9% FLUSH
10.0000 mL | INTRAVENOUS | Status: DC | PRN
  Administered 2018-07-30: 10 mL
  Filled 2018-07-30: qty 10

## 2018-07-30 MED ORDER — PALONOSETRON HCL INJECTION 0.25 MG/5ML
0.2500 mg | Freq: Once | INTRAVENOUS | Status: AC
  Administered 2018-07-30: 0.25 mg via INTRAVENOUS

## 2018-07-30 MED ORDER — SODIUM CHLORIDE 0.9 % IV SOLN
Freq: Once | INTRAVENOUS | Status: AC
  Administered 2018-07-30: 10:00:00 via INTRAVENOUS
  Filled 2018-07-30: qty 250

## 2018-07-30 MED ORDER — HEPARIN SOD (PORK) LOCK FLUSH 100 UNIT/ML IV SOLN
500.0000 [IU] | Freq: Once | INTRAVENOUS | Status: AC | PRN
  Administered 2018-07-30: 500 [IU]
  Filled 2018-07-30: qty 5

## 2018-07-30 MED ORDER — SODIUM CHLORIDE 0.9 % IV SOLN
200.0000 mg | Freq: Once | INTRAVENOUS | Status: AC
  Administered 2018-07-30: 200 mg via INTRAVENOUS
  Filled 2018-07-30: qty 20

## 2018-07-30 MED ORDER — TRASTUZUMAB CHEMO 150 MG IV SOLR
6.0000 mg/kg | Freq: Once | INTRAVENOUS | Status: AC
  Administered 2018-07-30: 588 mg via INTRAVENOUS
  Filled 2018-07-30: qty 28

## 2018-07-30 MED ORDER — SODIUM CHLORIDE 0.9 % IV SOLN
75.0000 mg/m2 | Freq: Once | INTRAVENOUS | Status: AC
  Administered 2018-07-30: 150 mg via INTRAVENOUS
  Filled 2018-07-30: qty 15

## 2018-07-30 MED ORDER — PEGFILGRASTIM 6 MG/0.6ML ~~LOC~~ PSKT
6.0000 mg | PREFILLED_SYRINGE | Freq: Once | SUBCUTANEOUS | Status: AC
  Administered 2018-07-30: 6 mg via SUBCUTANEOUS

## 2018-07-30 MED ORDER — DIPHENHYDRAMINE HCL 25 MG PO CAPS
ORAL_CAPSULE | ORAL | Status: AC
  Filled 2018-07-30: qty 1

## 2018-07-30 MED ORDER — ACETAMINOPHEN 325 MG PO TABS
ORAL_TABLET | ORAL | Status: AC
  Filled 2018-07-30: qty 2

## 2018-07-30 MED ORDER — SODIUM CHLORIDE 0.9 % IV SOLN
Freq: Once | INTRAVENOUS | Status: DC
  Filled 2018-07-30: qty 250

## 2018-07-30 MED ORDER — SODIUM CHLORIDE 0.9% FLUSH
10.0000 mL | Freq: Once | INTRAVENOUS | Status: AC
  Administered 2018-07-30: 10 mL
  Filled 2018-07-30: qty 10

## 2018-07-30 NOTE — Progress Notes (Signed)
Per Dr. Jana Hakim due to creatinine level, add 5101mL of NS, and he will do a dose reduction of carboplatin.

## 2018-07-30 NOTE — Telephone Encounter (Signed)
Gave avs and calendar had to put fluids on 9/7

## 2018-07-30 NOTE — Patient Instructions (Signed)
[name] [name] Discharge Instructions for Patients Receiving Chemotherapy  Today you received the following chemotherapy agents Herceptin,taxotere,Carboplatin.   To help prevent nausea and vomiting after your treatment, we encourage you to take your nausea medication as directed  If you develop nausea and vomiting that is not controlled by your nausea medication, call the clinic.   BELOW ARE SYMPTOMS THAT SHOULD BE REPORTED IMMEDIATELY:  *FEVER GREATER THAN 100.5 F  *CHILLS WITH OR WITHOUT FEVER  NAUSEA AND VOMITING THAT IS NOT CONTROLLED WITH YOUR NAUSEA MEDICATION  *UNUSUAL SHORTNESS OF BREATH  *UNUSUAL BRUISING OR BLEEDING  TENDERNESS IN MOUTH AND THROAT WITH OR WITHOUT PRESENCE OF ULCERS  *URINARY PROBLEMS  *BOWEL PROBLEMS  UNUSUAL RASH Items with * indicate a potential emergency and should be followed up as soon as possible.  Feel free to call the clinic should you have any questions or concerns. The clinic phone number is [phone].  Please show the [place] at check-in to the Emergency Department and triage nurse.

## 2018-08-05 NOTE — Progress Notes (Signed)
No show

## 2018-08-06 ENCOUNTER — Encounter: Payer: Self-pay | Admitting: Genetics

## 2018-08-06 ENCOUNTER — Inpatient Hospital Stay: Payer: 59 | Attending: Oncology | Admitting: Oncology

## 2018-08-06 DIAGNOSIS — Z5112 Encounter for antineoplastic immunotherapy: Secondary | ICD-10-CM | POA: Insufficient documentation

## 2018-08-06 DIAGNOSIS — R197 Diarrhea, unspecified: Secondary | ICD-10-CM | POA: Insufficient documentation

## 2018-08-06 DIAGNOSIS — Z1379 Encounter for other screening for genetic and chromosomal anomalies: Secondary | ICD-10-CM | POA: Insufficient documentation

## 2018-08-06 DIAGNOSIS — Z5111 Encounter for antineoplastic chemotherapy: Secondary | ICD-10-CM | POA: Insufficient documentation

## 2018-08-06 DIAGNOSIS — C50811 Malignant neoplasm of overlapping sites of right female breast: Secondary | ICD-10-CM | POA: Insufficient documentation

## 2018-08-06 DIAGNOSIS — C50411 Malignant neoplasm of upper-outer quadrant of right female breast: Secondary | ICD-10-CM | POA: Insufficient documentation

## 2018-08-06 DIAGNOSIS — Z7189 Other specified counseling: Secondary | ICD-10-CM | POA: Insufficient documentation

## 2018-08-07 ENCOUNTER — Encounter: Payer: Self-pay | Admitting: Oncology

## 2018-08-08 ENCOUNTER — Inpatient Hospital Stay: Payer: 59

## 2018-08-08 VITALS — BP 131/75 | HR 74 | Temp 99.2°F | Resp 20

## 2018-08-08 DIAGNOSIS — Z170 Estrogen receptor positive status [ER+]: Secondary | ICD-10-CM

## 2018-08-08 DIAGNOSIS — C50811 Malignant neoplasm of overlapping sites of right female breast: Secondary | ICD-10-CM | POA: Diagnosis not present

## 2018-08-08 DIAGNOSIS — Z5111 Encounter for antineoplastic chemotherapy: Secondary | ICD-10-CM | POA: Diagnosis not present

## 2018-08-08 DIAGNOSIS — Z5112 Encounter for antineoplastic immunotherapy: Secondary | ICD-10-CM | POA: Diagnosis not present

## 2018-08-08 DIAGNOSIS — R197 Diarrhea, unspecified: Secondary | ICD-10-CM | POA: Diagnosis not present

## 2018-08-08 DIAGNOSIS — Z95828 Presence of other vascular implants and grafts: Secondary | ICD-10-CM

## 2018-08-08 DIAGNOSIS — C50411 Malignant neoplasm of upper-outer quadrant of right female breast: Secondary | ICD-10-CM

## 2018-08-08 MED ORDER — SODIUM CHLORIDE 0.9% FLUSH
10.0000 mL | Freq: Once | INTRAVENOUS | Status: AC
  Administered 2018-08-08: 10 mL
  Filled 2018-08-08: qty 10

## 2018-08-08 MED ORDER — HEPARIN SOD (PORK) LOCK FLUSH 100 UNIT/ML IV SOLN
500.0000 [IU] | Freq: Once | INTRAVENOUS | Status: AC
  Administered 2018-08-08: 500 [IU]
  Filled 2018-08-08: qty 5

## 2018-08-08 MED ORDER — SODIUM CHLORIDE 0.9 % IV SOLN
Freq: Once | INTRAVENOUS | Status: AC
  Administered 2018-08-08: 10:00:00 via INTRAVENOUS
  Filled 2018-08-08: qty 250

## 2018-08-08 NOTE — Patient Instructions (Addendum)
Dehydration, Adult Dehydration is when there is not enough fluid or water in your body. This happens when you lose more fluids than you take in. Dehydration can range from mild to very bad. It should be treated right away to keep it from getting very bad. Symptoms of mild dehydration may include:  Thirst.  Dry lips.  Slightly dry mouth.  Dry, warm skin.  Dizziness. Symptoms of moderate dehydration may include:  Very dry mouth.  Muscle cramps.  Dark pee (urine). Pee may be the color of tea.  Your body making less pee.  Your eyes making fewer tears.  Heartbeat that is uneven or faster than normal (palpitations).  Headache.  Light-headedness, especially when you stand up from sitting.  Fainting (syncope). Symptoms of very bad dehydration may include:  Changes in skin, such as: ? Cold and clammy skin. ? Blotchy (mottled) or pale skin. ? Skin that does not quickly return to normal after being lightly pinched and let go (poor skin turgor).  Changes in body fluids, such as: ? Feeling very thirsty. ? Your eyes making fewer tears. ? Not sweating when body temperature is high, such as in hot weather. ? Your body making very little pee.  Changes in vital signs, such as: ? Weak pulse. ? Pulse that is more than 100 beats a minute when you are sitting still. ? Fast breathing. ? Low blood pressure.  Other changes, such as: ? Sunken eyes. ? Cold hands and feet. ? Confusion. ? Lack of energy (lethargy). ? Trouble waking up from sleep. ? Short-term weight loss. ? Unconsciousness. Follow these instructions at home:  If told by your doctor, drink an ORS: ? Make an ORS by using instructions on the package. ? Start by drinking small amounts, about  cup (120 mL) every 5-10 minutes. ? Slowly drink more until you have had the amount that your doctor said to have.  Drink enough clear fluid to keep your pee clear or pale yellow. If you were told to drink an ORS, finish the ORS  first, then start slowly drinking clear fluids. Drink fluids such as: ? Water. Do not drink only water by itself. Doing that can make the salt (sodium) level in your body get too low (hyponatremia). ? Ice chips. ? Fruit juice that you have added water to (diluted). ? Low-calorie sports drinks.  Avoid: ? Alcohol. ? Drinks that have a lot of sugar. These include high-calorie sports drinks, fruit juice that does not have water added, and soda. ? Caffeine. ? Foods that are greasy or have a lot of fat or sugar.  Take over-the-counter and prescription medicines only as told by your doctor.  Do not take salt tablets. Doing that can make the salt level in your body get too high (hypernatremia).  Eat foods that have minerals (electrolytes). Examples include bananas, oranges, potatoes, tomatoes, and spinach.  Keep all follow-up visits as told by your doctor. This is important. Contact a doctor if:  You have belly (abdominal) pain that: ? Gets worse. ? Stays in one area (localizes).  You have a rash.  You have a stiff neck.  You get angry or annoyed more easily than normal (irritability).  You are more sleepy than normal.  You have a harder time waking up than normal.  You feel: ? Weak. ? Dizzy. ? Very thirsty.  You have peed (urinated) only a small amount of very dark pee during 6-8 hours. Get help right away if:  You have symptoms of   very bad dehydration.  You cannot drink fluids without throwing up (vomiting).  Your symptoms get worse with treatment.  You have a fever.  You have a very bad headache.  You are throwing up or having watery poop (diarrhea) and it: ? Gets worse. ? Does not go away.  You have blood or something green (bile) in your throw-up.  You have blood in your poop (stool). This may cause poop to look black and tarry.  You have not peed in 6-8 hours.  You pass out (faint).  Your heart rate when you are sitting still is more than 100 beats a  minute.  You have trouble breathing. This information is not intended to replace advice given to you by your health care provider. Make sure you discuss any questions you have with your health care provider. Document Released: 09/14/2009 Document Revised: 06/07/2016 Document Reviewed: 01/12/2016 Elsevier Interactive Patient Education  2018 [employer]  Dehydration, Adult Dehydration is when there is not enough fluid or water in your body. This happens when you lose more fluids than you take in. Dehydration can range from mild to very bad. It should be treated right away to keep it from getting very bad. Symptoms of mild dehydration may include:  Thirst.  Dry lips.  Slightly dry mouth.  Dry, warm skin.  Dizziness. Symptoms of moderate dehydration may include:  Very dry mouth.  Muscle cramps.  Dark pee (urine). Pee may be the color of tea.  Your body making less pee.  Your eyes making fewer tears.  Heartbeat that is uneven or faster than normal (palpitations).  Headache.  Light-headedness, especially when you stand up from sitting.  Fainting (syncope). Symptoms of very bad dehydration may include:  Changes in skin, such as: ? Cold and clammy skin. ? Blotchy (mottled) or pale skin. ? Skin that does not quickly return to normal after being lightly pinched and let go (poor skin turgor).  Changes in body fluids, such as: ? Feeling very thirsty. ? Your eyes making fewer tears. ? Not sweating when body temperature is high, such as in hot weather. ? Your body making very little pee.  Changes in vital signs, such as: ? Weak pulse. ? Pulse that is more than 100 beats a minute when you are sitting still. ? Fast breathing. ? Low blood pressure.  Other changes, such as: ? Sunken eyes. ? Cold hands and feet. ? Confusion. ? Lack of energy (lethargy). ? Trouble waking up from sleep. ? Short-term weight loss. ? Unconsciousness. Follow these instructions at  home:  If told by your doctor, drink an ORS: ? Make an ORS by using instructions on the package. ? Start by drinking small amounts, about  cup (120 mL) every 5-10 minutes. ? Slowly drink more until you have had the amount that your doctor said to have.  Drink enough clear fluid to keep your pee clear or pale yellow. If you were told to drink an ORS, finish the ORS first, then start slowly drinking clear fluids. Drink fluids such as: ? Water. Do not drink only water by itself. Doing that can make the salt (sodium) level in your body get too low (hyponatremia). ? Ice chips. ? Fruit juice that you have added water to (diluted). ? Low-calorie sports drinks.  Avoid: ? Alcohol. ? Drinks that have a lot of sugar. These include high-calorie sports drinks, fruit juice that does not have water added, and soda. ? Caffeine. ? Foods that are greasy or have   a lot of fat or sugar.  Take over-the-counter and prescription medicines only as told by your doctor.  Do not take salt tablets. Doing that can make the salt level in your body get too high (hypernatremia).  Eat foods that have minerals (electrolytes). Examples include bananas, oranges, potatoes, tomatoes, and spinach.  Keep all follow-up visits as told by your doctor. This is important. Contact a doctor if:  You have belly (abdominal) pain that: ? Gets worse. ? Stays in one area (localizes).  You have a rash.  You have a stiff neck.  You get angry or annoyed more easily than normal (irritability).  You are more sleepy than normal.  You have a harder time waking up than normal.  You feel: ? Weak. ? Dizzy. ? Very thirsty.  You have peed (urinated) only a small amount of very dark pee during 6-8 hours. Get help right away if:  You have symptoms of very bad dehydration.  You cannot drink fluids without throwing up (vomiting).  Your symptoms get worse with treatment.  You have a fever.  You have a very bad headache.  You  are throwing up or having watery poop (diarrhea) and it: ? Gets worse. ? Does not go away.  You have blood or something green (bile) in your throw-up.  You have blood in your poop (stool). This may cause poop to look black and tarry.  You have not peed in 6-8 hours.  You pass out (faint).  Your heart rate when you are sitting still is more than 100 beats a minute.  You have trouble breathing. This information is not intended to replace advice given to you by your health care provider. Make sure you discuss any questions you have with your health care provider. Document Released: 09/14/2009 Document Revised: 06/07/2016 Document Reviewed: 01/12/2016 Elsevier Interactive Patient Education  2018 [employer]  

## 2018-08-11 DIAGNOSIS — C50411 Malignant neoplasm of upper-outer quadrant of right female breast: Secondary | ICD-10-CM | POA: Diagnosis not present

## 2018-08-11 DIAGNOSIS — E0590 Thyrotoxicosis, unspecified without thyrotoxic crisis or storm: Secondary | ICD-10-CM | POA: Diagnosis not present

## 2018-08-11 DIAGNOSIS — Z170 Estrogen receptor positive status [ER+]: Secondary | ICD-10-CM | POA: Diagnosis not present

## 2018-08-12 ENCOUNTER — Other Ambulatory Visit: Payer: Self-pay | Admitting: General Surgery

## 2018-08-12 ENCOUNTER — Other Ambulatory Visit (HOSPITAL_COMMUNITY): Payer: Self-pay | Admitting: General Surgery

## 2018-08-12 DIAGNOSIS — E0590 Thyrotoxicosis, unspecified without thyrotoxic crisis or storm: Secondary | ICD-10-CM

## 2018-08-13 DIAGNOSIS — C50911 Malignant neoplasm of unspecified site of right female breast: Secondary | ICD-10-CM | POA: Diagnosis not present

## 2018-08-13 DIAGNOSIS — Z923 Personal history of irradiation: Secondary | ICD-10-CM | POA: Diagnosis not present

## 2018-08-13 DIAGNOSIS — Z853 Personal history of malignant neoplasm of breast: Secondary | ICD-10-CM | POA: Diagnosis not present

## 2018-08-14 ENCOUNTER — Other Ambulatory Visit: Payer: Self-pay | Admitting: *Deleted

## 2018-08-14 ENCOUNTER — Ambulatory Visit: Payer: Self-pay | Admitting: General Surgery

## 2018-08-14 DIAGNOSIS — Z170 Estrogen receptor positive status [ER+]: Secondary | ICD-10-CM

## 2018-08-14 DIAGNOSIS — C50411 Malignant neoplasm of upper-outer quadrant of right female breast: Secondary | ICD-10-CM

## 2018-08-18 NOTE — Progress Notes (Signed)
Disability forms completed and mailed to patient address on file. No fax number provided to fax forms to.

## 2018-08-20 ENCOUNTER — Inpatient Hospital Stay: Payer: 59 | Admitting: Adult Health

## 2018-08-20 ENCOUNTER — Inpatient Hospital Stay: Payer: 59

## 2018-08-20 ENCOUNTER — Encounter: Payer: Self-pay | Admitting: Adult Health

## 2018-08-20 VITALS — BP 158/97 | HR 72 | Temp 98.1°F | Resp 17

## 2018-08-20 DIAGNOSIS — Z170 Estrogen receptor positive status [ER+]: Secondary | ICD-10-CM

## 2018-08-20 DIAGNOSIS — R197 Diarrhea, unspecified: Secondary | ICD-10-CM | POA: Diagnosis not present

## 2018-08-20 DIAGNOSIS — G629 Polyneuropathy, unspecified: Secondary | ICD-10-CM | POA: Diagnosis not present

## 2018-08-20 DIAGNOSIS — Z801 Family history of malignant neoplasm of trachea, bronchus and lung: Secondary | ICD-10-CM

## 2018-08-20 DIAGNOSIS — C50411 Malignant neoplasm of upper-outer quadrant of right female breast: Secondary | ICD-10-CM

## 2018-08-20 DIAGNOSIS — C50811 Malignant neoplasm of overlapping sites of right female breast: Secondary | ICD-10-CM | POA: Diagnosis not present

## 2018-08-20 DIAGNOSIS — Z95828 Presence of other vascular implants and grafts: Secondary | ICD-10-CM

## 2018-08-20 DIAGNOSIS — Z807 Family history of other malignant neoplasms of lymphoid, hematopoietic and related tissues: Secondary | ICD-10-CM

## 2018-08-20 DIAGNOSIS — Z5111 Encounter for antineoplastic chemotherapy: Secondary | ICD-10-CM | POA: Diagnosis not present

## 2018-08-20 DIAGNOSIS — R109 Unspecified abdominal pain: Secondary | ICD-10-CM

## 2018-08-20 LAB — CBC WITH DIFFERENTIAL/PLATELET
Basophils Absolute: 0 10*3/uL (ref 0.0–0.1)
Basophils Relative: 1 %
Eosinophils Absolute: 0 10*3/uL (ref 0.0–0.5)
Eosinophils Relative: 0 %
HCT: 28 % — ABNORMAL LOW (ref 34.8–46.6)
Hemoglobin: 9.2 g/dL — ABNORMAL LOW (ref 11.6–15.9)
Lymphocytes Relative: 14 %
Lymphs Abs: 0.9 10*3/uL (ref 0.9–3.3)
MCH: 32.5 pg (ref 25.1–34.0)
MCHC: 32.8 g/dL (ref 31.5–36.0)
MCV: 99 fL (ref 79.5–101.0)
Monocytes Absolute: 1.1 10*3/uL — ABNORMAL HIGH (ref 0.1–0.9)
Monocytes Relative: 17 %
Neutro Abs: 4.7 10*3/uL (ref 1.5–6.5)
Neutrophils Relative %: 68 %
Platelets: 197 10*3/uL (ref 145–400)
RBC: 2.83 MIL/uL — ABNORMAL LOW (ref 3.70–5.45)
RDW: 20.9 % — ABNORMAL HIGH (ref 11.2–14.5)
WBC: 6.8 10*3/uL (ref 3.9–10.3)

## 2018-08-20 LAB — COMPREHENSIVE METABOLIC PANEL
ALT: 19 U/L (ref 0–44)
AST: 17 U/L (ref 15–41)
Albumin: 3.5 g/dL (ref 3.5–5.0)
Alkaline Phosphatase: 82 U/L (ref 38–126)
Anion gap: 7 (ref 5–15)
BUN: 18 mg/dL (ref 6–20)
CO2: 28 mmol/L (ref 22–32)
Calcium: 10.5 mg/dL — ABNORMAL HIGH (ref 8.9–10.3)
Chloride: 106 mmol/L (ref 98–111)
Creatinine, Ser: 1.47 mg/dL — ABNORMAL HIGH (ref 0.44–1.00)
GFR calc Af Amer: 48 mL/min — ABNORMAL LOW (ref >60)
GFR calc non Af Amer: 42 mL/min — ABNORMAL LOW (ref >60)
Glucose, Bld: 88 mg/dL (ref 70–99)
Potassium: 4.4 mmol/L (ref 3.5–5.1)
Sodium: 141 mmol/L (ref 135–145)
Total Bilirubin: 0.4 mg/dL (ref 0.3–1.2)
Total Protein: 6.6 g/dL (ref 6.5–8.1)

## 2018-08-20 MED ORDER — SODIUM CHLORIDE 0.9% FLUSH
10.0000 mL | Freq: Once | INTRAVENOUS | Status: AC
  Administered 2018-08-20: 10 mL
  Filled 2018-08-20: qty 10

## 2018-08-20 MED ORDER — TRASTUZUMAB CHEMO 150 MG IV SOLR
6.0000 mg/kg | Freq: Once | INTRAVENOUS | Status: AC
  Administered 2018-08-20: 588 mg via INTRAVENOUS
  Filled 2018-08-20: qty 28

## 2018-08-20 MED ORDER — GABAPENTIN 100 MG PO CAPS: 100.0000 mg | ORAL_CAPSULE | Freq: Every day | ORAL | 0 refills | Status: DC

## 2018-08-20 MED ORDER — DIPHENHYDRAMINE HCL 25 MG PO CAPS
ORAL_CAPSULE | ORAL | Status: AC
  Filled 2018-08-20: qty 1

## 2018-08-20 MED ORDER — DIPHENHYDRAMINE HCL 25 MG PO CAPS
25.0000 mg | ORAL_CAPSULE | Freq: Once | ORAL | Status: AC
  Administered 2018-08-20: 25 mg via ORAL

## 2018-08-20 MED ORDER — SODIUM CHLORIDE 0.9 % IV SOLN
Freq: Once | INTRAVENOUS | Status: AC
  Administered 2018-08-20: 14:00:00 via INTRAVENOUS
  Filled 2018-08-20: qty 250

## 2018-08-20 MED ORDER — HEPARIN SOD (PORK) LOCK FLUSH 100 UNIT/ML IV SOLN
500.0000 [IU] | Freq: Once | INTRAVENOUS | Status: AC | PRN
  Administered 2018-08-20: 500 [IU]
  Filled 2018-08-20: qty 5

## 2018-08-20 MED ORDER — ACETAMINOPHEN 325 MG PO TABS
ORAL_TABLET | ORAL | Status: AC
  Filled 2018-08-20: qty 2

## 2018-08-20 MED ORDER — SODIUM CHLORIDE 0.9% FLUSH
10.0000 mL | INTRAVENOUS | Status: DC | PRN
  Administered 2018-08-20: 10 mL
  Filled 2018-08-20: qty 10

## 2018-08-20 MED ORDER — ACETAMINOPHEN 325 MG PO TABS
650.0000 mg | ORAL_TABLET | Freq: Once | ORAL | Status: AC
  Administered 2018-08-20: 650 mg via ORAL

## 2018-08-20 NOTE — Patient Instructions (Signed)
[facility] Discharge Instructions for Patients Receiving Chemotherapy  Today you received the following chemotherapy agents Herceptin  To help prevent nausea and vomiting after your treatment, we encourage you to take your nausea medication as directed   If you develop nausea and vomiting that is not controlled by your nausea medication, call the clinic.   BELOW ARE SYMPTOMS THAT SHOULD BE REPORTED IMMEDIATELY:  *FEVER GREATER THAN 100.5 F  *CHILLS WITH OR WITHOUT FEVER  NAUSEA AND VOMITING THAT IS NOT CONTROLLED WITH YOUR NAUSEA MEDICATION  *UNUSUAL SHORTNESS OF BREATH  *UNUSUAL BRUISING OR BLEEDING  TENDERNESS IN MOUTH AND THROAT WITH OR WITHOUT PRESENCE OF ULCERS  *URINARY PROBLEMS  *BOWEL PROBLEMS  UNUSUAL RASH Items with * indicate a potential emergency and should be followed up as soon as possible.  Feel free to call the clinic should you have any questions or concerns. The clinic phone number is [phone].  Please show the CHEMO ALERT CARD at check-in to the Emergency Department and triage nurse.   

## 2018-08-20 NOTE — Progress Notes (Signed)
[place]  [phone] [phone]     ID: [name] [name] DOB: [birth year]  MR#: [id number]  QQP#:[id number]  Patient Care Team: Marda Stalker, PA-C as PCP - General (Family Medicine) Jerline Pain, MD as PCP - Cardiology (Cardiology) Magrinat, Virgie Dad, MD as Consulting Physician (Oncology) Loreta Ave, MD as Referring Physician (Internal Medicine) Donato Heinz, MD as Consulting Physician (Nephrology) Kyung Rudd, MD as Consulting Physician (Radiation Oncology) Jovita Kussmaul, MD as Consulting Physician (General Surgery) Larey Dresser, MD as Consulting Physician (Cardiology) OTHER MD:  CHIEF COMPLAINT: Triple positive breast cancer  CURRENT TREATMENT: Neoadjuvant chemotherapy   HISTORY OF CURRENT ILLNESS: From the original intake note:  [name] Villavicencio palpated a lump on 01/15/2018. She felt soreness and shooting pain under her arm and in the right breast. She underwent bilateral diagnostic mammography with tomography and right breast ultrasonography at [facility] on 03/12/2018 showing: breast density category C. The is architectural distortion at the 11 o'clock position. There is also an oval mass in the right breast anterior depth. Examination of the right axilla showed enlarged lymph nodes. Ultrasonography showed a 4.6 cm mass in the right breast upper outer quadrant posterior depth. An additional 1.2 cm oval mass in the right breast 12 o'clock middle depth. The lymph nodes in the right axillary are highly suggestive of malignancy.   Accordingly on 03/16/2018 she proceeded to biopsy of the right breast area in question. The pathology from this procedure showed ([id number]): At both the 11 and 12 o'clock positions: Invasive ductal carcinoma grade II. Ductal carcinoma in situ, high grade, with necrosis and calcifications. Prognostic indicators significant for: estrogen receptor, 90% positive and progesterone receptor, 40% positive, both with  strong staining intensity. Proliferation marker Ki67 at 80%. HER2 amplified with ratios HER2/CEP17 SIGNALS 6.90 and average HER2 copies per cell 14.50  The patient's subsequent history is as detailed below.  INTERVAL HISTORY: [name] returns today for follow-up and treatment of her triple positive breast cancer. She has completed neoadjuvant chemotherapy with Docetaxel, Carboplatin and trastuzumab.  She is scheduled for surgery next week.  She tells me that she will undergo modified radical mastectomy without reconstruction.  She is due for Trastuzumab today.   REVIEW OF SYSTEMS: [name] notes new peripheral neuroapthy.  She says that it started after her third cycle.  She notes numbness in her fingertips and feet.  She is having balance issues in her feet.  She also has mild difficulty with grasping things with her fingertips.  She notes this is slightly improving.  She does have occasional pain in her feet as well.    Patient also has diarrhea worse in the morning that started after her last cycle of chemotherapy which was on 8/29.  This is not improving.  She does have abdominal cramping associated with this.  She notes it is happening 2-3 times in the mornings.    [name] is doing well otherwise.  She denies fevers, chills, chest pain, palpitations, cough, shortness of breath, headaches, vision changes, or any other concerns.  A detailed ROS was otherwise non contributory.     PAST MEDICAL HISTORY: Past Medical History:  Diagnosis Date  . Anxiety   . Breast cancer, left breast ([place]) 2000   Underwent lumpectomy, chemotherapy and radiation  . Facial paralysis/Bells palsy    left side  . Family history of lung cancer   . Family history of lymphoma   . Hypertension   . Personal history of breast cancer 05/06/2018  .  Renal disorder    just after TIA acute kidney injury  . TIA (transient ischemic attack) 03/16/2017   [place] [facility]   The patient has a previous history of left breast cancer  diagnosed in 2000. She was seen by [facility] in [place], [place] under Dr. Loreta Ave. According to the patient, her left breast cancer was stage II with no lymph node involvement (likely T2N0). She had chemotherapy every 3 weeks for about 6 months. She did not takes any "red drugs." She also did not take anti-estrogens (likely ER/PR negative).   TIA in 2018. She saw a neurologist as a precaution. She denies any clotting issues.   PAST SURGICAL HISTORY: Past Surgical History:  Procedure Laterality Date  . BREAST LUMPECTOMY WITH AXILLARY LYMPH NODE BIOPSY Left 2000   biopsy  . PORTACATH PLACEMENT Left 04/08/2018   Procedure: INSERTION PORT-A-CATH;  Surgeon: Jovita Kussmaul, MD;  Location: [facility];  Service: General;  Laterality: Left;  . TUBAL LIGATION Bilateral 2004  . WISDOM TOOTH EXTRACTION      FAMILY HISTORY Family History  Problem Relation Age of Onset  . Hypertension Mother   . Hypertension Father   . Diverticulosis Father   . Diabetes Father   . Lung cancer Father 60       hx smoking  . Hypertension Maternal Grandmother   . CAD Maternal Grandmother   . Alzheimer's disease Maternal Grandmother        died at 55  . Hypertension Paternal Grandmother   . CAD Paternal Grandmother   . Stroke Paternal Grandfather   . Sickle cell trait Paternal Grandfather   . Pneumonia Maternal Aunt        died at a young adult  . Lymphoma Paternal Aunt 3  The patient's father is alive at age 79 and had a history of lung cancer. The patient's mother is alive at age 44. The patient has 1 brother and no sisters. She denies a family history of breast or ovarian cancer.    GYNECOLOGIC HISTORY:  No LMP recorded. (Menstrual status: Perimenopausal). Menarche: 47 years old Age at first live birth: 47 years old She is GXP3. She is no longer having periods, which were irregular and light. Her LMP was  January 2019 and  December 2018. She is not having hot flashes. The patient used oral  contraceptive from [phone] and the Depo-provera shot from [phone] with no complications. She never used HRT.    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 Her husband, [name], is a Administrator. The patient's son, Shea Stakes age 23, works at [employer] in [place]. The patient's daughters, Lilia Pro age 57, and Levonne Spiller age 43, are students. The patient's adopted son, Vonna Kotyk age 33, is also a Ship broker.      ADVANCED DIRECTIVES:    HEALTH MAINTENANCE: Social History   Tobacco Use  . Smoking status: Never Smoker  . Smokeless tobacco: Never Used  Substance Use Topics  . Alcohol use: No  . Drug use: No     Colonoscopy:   PAP: 2015  Bone density:   Allergies  Allergen Reactions  . Ace Inhibitors Swelling    Swelling of lips and face  . Lisinopril Swelling    Swelling of lips, face  . Amlodipine Swelling    Leg swelling  . Adhesive [Tape] Itching and Rash    Please use paper tape  . Latex Itching and Rash    Current Outpatient Medications  Medication Sig  Dispense Refill  . aspirin EC 81 MG EC tablet Take 1 tablet (81 mg total) by mouth daily. 30 tablet 0  . BYSTOLIC 20 MG TABS TAKE 1 TABLET (20 MG TOTAL) BY MOUTH DAILY. (Patient taking differently: Take 20 mg by mouth daily. ) 30 tablet 6  . cloNIDine (CATAPRES) 0.2 MG tablet Take 0.2 mg by mouth daily as needed (BP above 150).     . Cyanocobalamin (VITAMIN B12) 500 MCG TABS Take 500 mcg by mouth daily.     . ferrous sulfate 325 (65 FE) MG tablet Take 1 tablet (325 mg total) by mouth daily. 30 tablet 0  . lidocaine-prilocaine (EMLA) cream Apply 1 application topically as needed.    . Multiple Vitamin (MULITIVITAMIN WITH MINERALS) TABS Take 1 tablet by mouth daily.      Marland Kitchen spironolactone (ALDACTONE) 50 MG tablet TAKE 1 TABLET (50 MG TOTAL) BY MOUTH DAILY. 30 tablet 6  . diphenoxylate-atropine (LOMOTIL) 2.5-0.025 MG tablet Take 1 tablet by mouth 4 (four) times daily as needed for diarrhea or loose  stools. (Patient not taking: Reported on 08/18/2018) 30 tablet 1  . trimethoprim-polymyxin b (POLYTRIM) ophthalmic solution Apply 2 eye drops on affected eye every 4 hrs. (Patient not taking: Reported on 08/18/2018) 10 mL 0   No current facility-administered medications for this visit.     OBJECTIVE:Seen in infusion.  See [facility] for vitals.   There were no vitals filed for this visit.   There is no height or weight on file to calculate BMI.   Wt Readings from Last 3 Encounters:  07/30/18 213 lb 4.8 oz (96.8 kg)  07/09/18 217 lb 11.2 oz (98.7 kg)  06/29/18 206 lb (93.4 kg)  ECOG FS:1 - Symptomatic but completely ambulatory GENERAL: Patient is a well appearing female in no acute distress HEENT:  Sclerae anicteric.  Oropharynx clear and moist. No ulcerations or evidence of oropharyngeal candidiasis. Neck is supple.  NODES:  No cervical, supraclavicular, or axillary lymphadenopathy palpated.  BREAST EXAM:  Deferred. LUNGS:  Clear to auscultation bilaterally.  No wheezes or rhonchi. HEART:  Regular rate and rhythm. No murmur appreciated. ABDOMEN:  Soft, nontender.  Positive, normoactive bowel sounds. No organomegaly palpated. MSK:  No focal spinal tenderness to palpation. Full range of motion bilaterally in the upper extremities. EXTREMITIES:  No peripheral edema.   SKIN:  Clear with no obvious rashes or skin changes. No nail dyscrasia. NEURO:  Nonfocal. Well oriented.  Appropriate affect.     LAB RESULTS:  CMP     Component Value Date/Time   NA 141 08/20/2018 1233   K 4.4 08/20/2018 1233   CL 106 08/20/2018 1233   CO2 28 08/20/2018 1233   GLUCOSE 88 08/20/2018 1233   BUN 18 08/20/2018 1233   CREATININE 1.47 (H) 08/20/2018 1233   CREATININE 1.29 (H) 03/25/2018 1129   CALCIUM 10.5 (H) 08/20/2018 1233   CALCIUM 10.2 06/25/2018 1425   PROT 6.6 08/20/2018 1233   ALBUMIN 3.5 08/20/2018 1233   AST 17 08/20/2018 1233   AST 22 03/25/2018 1129   ALT 19 08/20/2018 1233   ALT 29 03/25/2018  1129   ALKPHOS 82 08/20/2018 1233   BILITOT 0.4 08/20/2018 1233   BILITOT 0.3 03/25/2018 1129   GFRNONAA 42 (L) 08/20/2018 1233   GFRNONAA 49 (L) 03/25/2018 1129   GFRAA 48 (L) 08/20/2018 1233   GFRAA 57 (L) 03/25/2018 1129    No results found for: TOTALPROTELP, ALBUMINELP, A1GS, A2GS, BETS, BETA2SER, GAMS, MSPIKE, SPEI  No results found for: KPAFRELGTCHN, LAMBDASER, [facility]  Lab Results  Component Value Date   WBC 6.8 08/20/2018   NEUTROABS 4.7 08/20/2018   HGB 9.2 (L) 08/20/2018   HCT 28.0 (L) 08/20/2018   MCV 99.0 08/20/2018   PLT 197 08/20/2018    '@LASTCHEMISTRY'$ @  No results found for: LABCA2  No components found for: YQMVHQ469  No results for input(s): INR in the last 168 hours.  No results found for: LABCA2  No results found for: GEX528  No results found for: UXL244  No results found for: WNU272  No results found for: CA2729  No components found for: HGQUANT  No results found for: CEA1 / No results found for: CEA1   No results found for: AFPTUMOR  No results found for: CHROMOGRNA  No results found for: PSA1  Appointment on 08/20/2018  Component Date Value Ref Range Status  . WBC 08/20/2018 6.8  3.9 - 10.3 K/uL Final  . RBC 08/20/2018 2.83* 3.70 - 5.45 MIL/uL Final  . Hemoglobin 08/20/2018 9.2* 11.6 - 15.9 g/dL Final  . HCT 08/20/2018 28.0* 34.8 - 46.6 % Final  . MCV 08/20/2018 99.0  79.5 - 101.0 fL Final  . MCH 08/20/2018 32.5  25.1 - 34.0 pg Final  . MCHC 08/20/2018 32.8  31.5 - 36.0 g/dL Final  . RDW 08/20/2018 20.9* 11.2 - 14.5 % Final  . Platelets 08/20/2018 197  145 - 400 K/uL Final  . Neutrophils Relative % 08/20/2018 68  % Final  . Neutro Abs 08/20/2018 4.7  1.5 - 6.5 K/uL Final  . Lymphocytes Relative 08/20/2018 14  % Final  . Lymphs Abs 08/20/2018 0.9  0.9 - 3.3 K/uL Final  . Monocytes Relative 08/20/2018 17  % Final  . Monocytes Absolute 08/20/2018 1.1* 0.1 - 0.9 K/uL Final  . Eosinophils Relative 08/20/2018 0  % Final  .  Eosinophils Absolute 08/20/2018 0.0  0.0 - 0.5 K/uL Final  . Basophils Relative 08/20/2018 1  % Final  . Basophils Absolute 08/20/2018 0.0  0.0 - 0.1 K/uL Final   Performed at [facility], [place] [address], [place], [place] [address]  . Sodium 08/20/2018 141  135 - 145 mmol/L Final  . Potassium 08/20/2018 4.4  3.5 - 5.1 mmol/L Final  . Chloride 08/20/2018 106  98 - 111 mmol/L Final  . CO2 08/20/2018 28  22 - 32 mmol/L Final  . Glucose, Bld 08/20/2018 88  70 - 99 mg/dL Final  . BUN 08/20/2018 18  6 - 20 mg/dL Final  . Creatinine, Ser 08/20/2018 1.47* 0.44 - 1.00 mg/dL Final  . Calcium 08/20/2018 10.5* 8.9 - 10.3 mg/dL Final  . Total Protein 08/20/2018 6.6  6.5 - 8.1 g/dL Final  . Albumin 08/20/2018 3.5  3.5 - 5.0 g/dL Final  . AST 08/20/2018 17  15 - 41 U/L Final  . ALT 08/20/2018 19  0 - 44 U/L Final  . Alkaline Phosphatase 08/20/2018 82  38 - 126 U/L Final  . Total Bilirubin 08/20/2018 0.4  0.3 - 1.2 mg/dL Final  . GFR calc non Af Amer 08/20/2018 42* >60 mL/min Final  . GFR calc Af Amer 08/20/2018 48* >60 mL/min Final   Comment: (NOTE) The eGFR has been calculated using the CKD EPI equation. This calculation has not been validated in all clinical situations. eGFR's persistently <60 mL/min signify possible Chronic Kidney Disease.   . Anion gap 08/20/2018 7  5 - 15 Final   Performed at [facility], [place] [address] [place], [place] [address]    (this displays the last labs from the last 3 days)  No results found for: TOTALPROTELP, ALBUMINELP, A1GS, A2GS, BETS, BETA2SER, GAMS, MSPIKE, SPEI (this displays SPEP labs)  No results found for: KPAFRELGTCHN, LAMBDASER, KAPLAMBRATIO (kappa/lambda light chains)  No results found for: HGBA, HGBA2QUANT, HGBFQUANT, HGBSQUAN (Hemoglobinopathy evaluation)   No results found for: LDH  No results found for: IRON, TIBC, IRONPCTSAT (Iron and TIBC)  No results found for:  FERRITIN  Urinalysis    Component Value Date/Time   COLORURINE STRAW (A) 03/17/2017 0600   APPEARANCEUR CLEAR (A) 03/17/2017 0600   LABSPEC 1.031 (H) 03/17/2017 0600   PHURINE 5.0 03/17/2017 0600   GLUCOSEU NEGATIVE 03/17/2017 0600   HGBUR NEGATIVE 03/17/2017 0600   BILIRUBINUR NEGATIVE 03/17/2017 0600   KETONESUR NEGATIVE 03/17/2017 0600   PROTEINUR NEGATIVE 03/17/2017 0600   UROBILINOGEN 0.2 11/29/2011 1656   NITRITE NEGATIVE 03/17/2017 0600   LEUKOCYTESUR NEGATIVE 03/17/2017 0600     STUDIES: Mr Breast Bilateral W Wo Contrast Inc Cad  Result Date: 07/28/2018 CLINICAL DATA:  Follow-up right breast cancer treated with neoadjuvant chemotherapy. LABS:  None obtained on site today. EXAM: BILATERAL BREAST MRI WITH AND WITHOUT CONTRAST TECHNIQUE: Multiplanar, multisequence MR images of both breasts were obtained prior to and following the intravenous administration of 20 ml of MultiHance. Three-dimensional MR images were rendered by post-processing the original MR data using the DynaCAD thin client. The 3D MR images are interpreted and the findings are included in the complete MRI report below. COMPARISON:  03/31/2018. FINDINGS: Breast composition: a. Almost entirely fat. Background parenchymal enhancement: Minimal. Right breast: 2.0 x 1.5 cm irregular enhancing mass in the posterior to midportion of the upper-outer quadrant of the right breast. The mass previously demonstrated at that location measured 5.1 x 4.2 cm in corresponding dimensions previously. 0.7 x 0.6 cm irregular enhancing mass in the posterior aspect of the upper-outer quadrant of the right breast at the location of a previously biopsied conglomeration of metastatic nodes. No other enhancing masses are seen in the right breast at this time. The previously seen enhancing mass in right pectoralis muscle and chest wall is smaller without a discrete mass visible at this time. There is residual enhancement in that portion of the chest  wall. Left breast: No mass or abnormal enhancement. Lymph nodes: 3 right axillary lymph nodes with mild cortical thickening. The node with the most cortical thickening has a cortical thickness of 5 mm. The other more significantly enlarged lymph nodes previously demonstrated are no longer seen and the currently demonstrated lymph nodes are smaller. There is a mildly enlarged lymph node posterior to the anterior chest wall on the right, at the level of the upper liver, measuring 6 x 4 mm in maximum dimensions. This is unchanged in overall size with an interval fatty hilum, not previously visible. There is a smaller adjacent lymph node at that location as well. Ancillary findings:  None. IMPRESSION: 1. Marked imaging response to neoadjuvant chemotherapy with an interval decrease in size and number of right breast masses, significantly improved metastatic right axillary adenopathy and less pronounced right chest wall mass. 2. Possible small metastatic lymph node posterior to the right chest wall with interval visualization of a fatty hilum, compatible with imaging response to the neoadjuvant chemotherapy. 3. No evidence of malignancy on the left. RECOMMENDATION: Treatment plan. BI-RADS CATEGORY  6: Known biopsy-proven malignancy. Electronically Signed   By: Percell Locus.D.  On: 07/28/2018 13:12    ELIGIBLE FOR AVAILABLE RESEARCH PROTOCOL: no  ASSESSMENT: 47 y.o. [place], [place] woman  (1) status post left lumpectomy in 2000 for a (?) T2N0 breast cancer,  (a) status post adjuvant chemotherapy  (b) status post adjuvant radiation  (c) did not receive antiestrogens  (2) genetics testing 05/06/2018, results pending  (3) status post right breast biopsy 03/16/2018 for a clinical mT2-3 N2, anatomic stage III invasive ductal carcinoma, grade 2, triple positive, with an MIB-1 of 80%.  (a) bone scan and chest CT scan negative for metastases except for a thoracic spine lytic lesion of concern  (4) neoadjuvant  chemotherapy consisting of carboplatin, docetaxel, trastuzumab and Pertuzumab every 3 weeks x 6 starting 04/16/2018  (a) pertuzumab held beginning with cycle 2 because of diarrhea  (5) trastuzumab to be continued to a total of 6 months  (a) echocardiogram on 06/29/2018 showed an ejection fraction in the 65-70% range  (6) definitive surgery on 08/26/2018  (7) adjuvant radiation to the right breast planned  (8) antiestrogens to start at the completion of local treatment  PLAN: [name] is doing moderately well. She will receive Trastuzumab today.  She will leave [place] stool studies tomorrow for [place] to evaluate her loose stools.  Will consider adding Flagyl or Cipro tomorrow after she returns them.    The next issue is that of her peripheral neuropathy.  I prescribed Gabapentin for her to slowly titrate up on at night.  We will see if this helps.  Often times PT will help with neuropathy.  She will need PT after her surgery, so I will reach out to Dr. Marlou Starks and Inez Catalina in PT to see if we can coordinate these together.    [name] will continue on maintenance Trastuzumab.  She will return to see me in clinic on 09/10/2018 for her next dose and to review pathology results with myself and Dr. Jana Hakim.  She knows to call for any problems that may develop before the next visit.  A total of (30) minutes of face-to-face time was spent with this patient with greater than 50% of that time in counseling and care-coordination.   Wilber Bihari, NP  08/20/18 1:47 PM Medical Oncology and Hematology [facility] [address] [place], [place] [address] Tel. [phone]    Fax. [phone]

## 2018-08-21 ENCOUNTER — Encounter: Payer: Self-pay | Admitting: Radiation Oncology

## 2018-08-21 ENCOUNTER — Encounter
Admission: RE | Admit: 2018-08-21 | Discharge: 2018-08-21 | Disposition: A | Discharge status: Home / Self Care | Payer: 59 | Source: Ambulatory Visit | Attending: General Surgery | Admitting: General Surgery

## 2018-08-21 ENCOUNTER — Telehealth: Payer: Self-pay | Admitting: Adult Health

## 2018-08-21 NOTE — Pre-Procedure Instructions (Signed)
[place] Iwata  08/21/2018      [id number] [address]on, [place] [address] [place] [address] Phone: [phone] Fax: [phone]    Your procedure is scheduled on Wednesday September 25.  Report to [facility] Admitting at 11:30 A.M.  Call this number if you have problems the morning of surgery:  [phone]   Remember:  Do not eat or drink after midnight.  You may drink clear liquids until 10:30 AM .  Clear liquids allowed are: Water, Juice (non-citric and without pulp), Black Coffee only and Gatorade    Take these medicines the morning of surgery with A SIP OF WATER:   Bystolic Clonidine if needed Gabapentin (neurontin)  7 days prior to surgery STOP taking any Aleve, Naproxen, Ibuprofen, Motrin, Advil, Goody's, BC's, all herbal medications, fish oil, and all vitamins  Follow your surgeon's instructions on stopping Aspirin. If no instructions were given, please call your surgeon's office.    Do not wear jewelry, make-up or nail polish.  Do not wear lotions, powders, or perfumes, or deodorant.  Do not shave 48 hours prior to surgery.  Men may shave face and neck.  Do not bring valuables to the hospital.  [facility] is not responsible for any belongings or valuables.  Contacts, dentures or bridgework may not be worn into surgery.  Leave your suitcase in the car.  After surgery it may be brought to your room.  For patients admitted to the hospital, discharge time will be determined by your treatment team.  Patients discharged the day of surgery will not be allowed to drive home.   Special instructions:    [place]- Preparing For Surgery  Before surgery, you can play an important role. Because skin is not sterile, your skin needs to be as free of germs as possible. You can reduce the number of germs on your skin by washing with CHG (chlorahexidine gluconate) Soap before surgery.  CHG is an antiseptic cleaner which kills  germs and bonds with the skin to continue killing germs even after washing.    Oral Hygiene is also important to reduce your risk of infection.  Remember - BRUSH YOUR TEETH THE MORNING OF SURGERY WITH YOUR REGULAR TOOTHPASTE  Please do not use if you have an allergy to CHG or antibacterial soaps. If your skin becomes reddened/irritated stop using the CHG.  Do not shave (including legs and underarms) for at least 48 hours prior to first CHG shower. It is OK to shave your face.  Please follow these instructions carefully.   1. Shower the NIGHT BEFORE SURGERY and the MORNING OF SURGERY with CHG.   2. If you chose to wash your hair, wash your hair first as usual with your normal shampoo.  3. After you shampoo, rinse your hair and body thoroughly to remove the shampoo.  4. Use CHG as you would any other liquid soap. You can apply CHG directly to the skin and wash gently with a scrungie or a clean washcloth.   5. Apply the CHG Soap to your body ONLY FROM THE NECK DOWN.  Do not use on open wounds or open sores. Avoid contact with your eyes, ears, mouth and genitals (private parts). Wash Face and genitals (private parts)  with your normal soap.  6. Wash thoroughly, paying special attention to the area where your surgery will be performed.  7. Thoroughly rinse your body with warm water from the neck down.  8.  DO NOT shower/wash with your normal soap after using and rinsing off the CHG Soap.  9. Pat yourself dry with a CLEAN TOWEL.  10. Wear CLEAN PAJAMAS to bed the night before surgery, wear comfortable clothes the morning of surgery  11. Place CLEAN SHEETS on your bed the night of your first shower and DO NOT SLEEP WITH PETS.    Day of Surgery:  Do not apply any deodorants/lotions.  Please wear clean clothes to the hospital/surgery center.   Remember to brush your teeth WITH YOUR REGULAR TOOTHPASTE.    Please read over the following fact sheets that you were given. Coughing and Deep  Breathing and Surgical Site Infection Prevention

## 2018-08-21 NOTE — Telephone Encounter (Signed)
Per 9/19 los, no new orders.  °

## 2018-08-24 ENCOUNTER — Encounter (HOSPITAL_COMMUNITY)
Admission: RE | Admit: 2018-08-24 | Discharge: 2018-08-24 | Disposition: A | Discharge status: Home / Self Care | Payer: 59 | Source: Ambulatory Visit | Attending: General Surgery | Admitting: General Surgery

## 2018-08-24 ENCOUNTER — Other Ambulatory Visit: Payer: Self-pay

## 2018-08-24 ENCOUNTER — Encounter (HOSPITAL_COMMUNITY): Payer: Self-pay

## 2018-08-24 ENCOUNTER — Other Ambulatory Visit: Payer: Self-pay | Admitting: Adult Health

## 2018-08-24 ENCOUNTER — Ambulatory Visit: Payer: 59 | Admitting: Adult Health

## 2018-08-24 DIAGNOSIS — Z170 Estrogen receptor positive status [ER+]: Secondary | ICD-10-CM

## 2018-08-24 DIAGNOSIS — G629 Polyneuropathy, unspecified: Secondary | ICD-10-CM

## 2018-08-24 DIAGNOSIS — C50411 Malignant neoplasm of upper-outer quadrant of right female breast: Secondary | ICD-10-CM | POA: Diagnosis not present

## 2018-08-24 DIAGNOSIS — Z791 Long term (current) use of non-steroidal anti-inflammatories (NSAID): Secondary | ICD-10-CM | POA: Diagnosis not present

## 2018-08-24 DIAGNOSIS — Z8673 Personal history of transient ischemic attack (TIA), and cerebral infarction without residual deficits: Secondary | ICD-10-CM | POA: Diagnosis not present

## 2018-08-24 DIAGNOSIS — Z9221 Personal history of antineoplastic chemotherapy: Secondary | ICD-10-CM | POA: Diagnosis not present

## 2018-08-24 DIAGNOSIS — C773 Secondary and unspecified malignant neoplasm of axilla and upper limb lymph nodes: Secondary | ICD-10-CM | POA: Diagnosis not present

## 2018-08-24 DIAGNOSIS — Z79899 Other long term (current) drug therapy: Secondary | ICD-10-CM | POA: Diagnosis not present

## 2018-08-24 DIAGNOSIS — Z7982 Long term (current) use of aspirin: Secondary | ICD-10-CM | POA: Diagnosis not present

## 2018-08-24 DIAGNOSIS — Z01812 Encounter for preprocedural laboratory examination: Secondary | ICD-10-CM

## 2018-08-24 DIAGNOSIS — I10 Essential (primary) hypertension: Secondary | ICD-10-CM | POA: Diagnosis not present

## 2018-08-24 DIAGNOSIS — Z923 Personal history of irradiation: Secondary | ICD-10-CM | POA: Diagnosis not present

## 2018-08-24 DIAGNOSIS — Z6841 Body Mass Index (BMI) 40.0 and over, adult: Secondary | ICD-10-CM | POA: Diagnosis not present

## 2018-08-24 HISTORY — DX: Polyneuropathy, unspecified: G62.9

## 2018-08-24 LAB — CBC
HCT: 31.8 % — ABNORMAL LOW (ref 36.0–46.0)
Hemoglobin: 9.7 g/dL — ABNORMAL LOW (ref 12.0–15.0)
MCH: 31.4 pg (ref 26.0–34.0)
MCHC: 30.5 g/dL (ref 30.0–36.0)
MCV: 102.9 fL — ABNORMAL HIGH (ref 78.0–100.0)
Platelets: 197 10*3/uL (ref 150–400)
RBC: 3.09 MIL/uL — ABNORMAL LOW (ref 3.87–5.11)
RDW: 17.7 % — ABNORMAL HIGH (ref 11.5–15.5)
WBC: 7.1 10*3/uL (ref 4.0–10.5)

## 2018-08-24 LAB — BASIC METABOLIC PANEL
Anion gap: 12 (ref 5–15)
BUN: 25 mg/dL — ABNORMAL HIGH (ref 6–20)
CO2: 22 mmol/L (ref 22–32)
Calcium: 10.8 mg/dL — ABNORMAL HIGH (ref 8.9–10.3)
Chloride: 104 mmol/L (ref 98–111)
Creatinine, Ser: 1.51 mg/dL — ABNORMAL HIGH (ref 0.44–1.00)
GFR calc Af Amer: 47 mL/min — ABNORMAL LOW (ref >60)
GFR calc non Af Amer: 40 mL/min — ABNORMAL LOW (ref >60)
Glucose, Bld: 96 mg/dL (ref 70–99)
Potassium: 3.8 mmol/L (ref 3.5–5.1)
Sodium: 138 mmol/L (ref 135–145)

## 2018-08-24 NOTE — Progress Notes (Signed)
Anesthesia Chart Review:  Case:  [id number] Date/Time:  08/26/18 1317   Procedure:  MASTECTOMY MODIFIED RADICAL (Right Breast)   Anesthesia type:  General   Pre-op diagnosis:  RIGHT BREAST CANCER   Location:  [facility] OR ROOM 02 / [place] [facility]   Surgeon:  Jovita Kussmaul, MD      DISCUSSION: 47 yo female never smoker for above procedure. Pertinent hx includes HTN, TIA (2018), Breast CA on chemotherapy, Neuropathy of fingers and toes, Renal insufficiency.  Cardiac clearance by Kerin Ransom PA-C 04/01/2018 stating "Pt is cleared for PICC line and breast surgery without further cardiac work up. We will be available as needed peri op."  Anemia noted on labs from 9/23 with h/h 08/08/30.8. Results called to surgeon's office.  Anticipate she can proceed as planned barring acute status change.  VS: There were no vitals taken for this visit.  PROVIDERS: Marda Stalker, PA-C is PCP  Candee Furbish, MD is Cardiologist  Magrinat, Sarajane Jews, MD is Oncologist  LABS: Labs reviewed: Acceptable for surgery. Hgb 9.7 called to surgeon's office. Elevated creatinine appears relatively stable based on review of history.  Results for [name], [name] (MRN [id number]) as of 08/24/2018 13:26  Ref. Range 05/06/2018 10:49 05/28/2018 07:36 06/18/2018 09:40 06/25/2018 14:25 07/09/2018 09:17 07/30/2018 07:45 08/20/2018 12:33 08/24/2018 08:09  Creatinine Latest Ref Range: 0.44 - 1.00 mg/dL 1.20 (H) 1.35 (H) 1.08 (H) 1.47 (H) 1.28 (H) 1.99 (H) 1.47 (H) 1.51 (H)    Results for [name], [name] (MRN [id number]) as of 08/24/2018 13:26  Ref. Range 08/24/2018 33:61  BASIC METABOLIC PANEL Unknown Rpt (A)  Sodium Latest Ref Range: 135 - 145 mmol/L 138  Potassium Latest Ref Range: 3.5 - 5.1 mmol/L 3.8  Chloride Latest Ref Range: 98 - 111 mmol/L 104  CO2 Latest Ref Range: 22 - 32 mmol/L 22  Glucose Latest Ref Range: 70 - 99 mg/dL 96  BUN Latest Ref Range: 6 - 20 mg/dL 25 (H)  Creatinine Latest Ref Range: 0.44 - 1.00 mg/dL 1.51 (H)  Calcium Latest Ref  Range: 8.9 - 10.3 mg/dL 10.8 (H)  Anion gap Latest Ref Range: 5 - 15  12  GFR, Est Non African American Latest Ref Range: >60 mL/min 40 (L)  GFR, Est African American Latest Ref Range: >60 mL/min 47 (L)  WBC Latest Ref Range: 4.0 - 10.5 K/uL 7.1  RBC Latest Ref Range: 3.87 - 5.11 MIL/uL 3.09 (L)  Hemoglobin Latest Ref Range: 12.0 - 15.0 g/dL 9.7 (L)  HCT Latest Ref Range: 36.0 - 46.0 % 31.8 (L)  MCV Latest Ref Range: 78.0 - 100.0 fL 102.9 (H)  MCH Latest Ref Range: 26.0 - 34.0 pg 31.4  MCHC Latest Ref Range: 30.0 - 36.0 g/dL 30.5  RDW Latest Ref Range: 11.5 - 15.5 % 17.7 (H)  Platelets Latest Ref Range: 150 - 400 K/uL 197     IMAGES: PORTABLE CHEST 1 VIEW 04/08/2018  COMPARISON:  Chest CT, 04/03/2018  FINDINGS: Left anterior chest wall Port-A-Cath has been placed. Tip projects in the lower superior vena cava just above the caval atrial junction, well positioned.  No pneumothorax.  Cardiac silhouette is normal in size. No mediastinal or hilar masses.  Minor lung base atelectasis.  Lungs otherwise clear.  Stable changes from prior left breast surgery.  IMPRESSION: 1. Well-positioned left anterior chest wall Port-A-Cath. 2. No acute cardiopulmonary disease.  No pneumothorax.  EKG: 04/01/2018: Sinus brady (54 bpm)  CV: TTE 06/29/2018: Study Conclusions  - Left ventricle: global LV longitudinal strain  normal at 21% The   cavity size was normal. Systolic function was vigorous. The   estimated ejection fraction was in the range of 65% to 70%. Wall   motion was normal; there were no regional wall motion   abnormalities. The study is not technically sufficient to allow   evaluation of LV diastolic function. - Mitral valve: There was trivial regurgitation. - Atrial septum: There was increased thickness of the septum,   consistent with lipomatous hypertrophy. - Tricuspid valve: There was trivial regurgitation. - Pulmonic valve: There was trivial regurgitation. -  Pulmonary arteries: Systolic pressure could not be accurately   estimated.  Impressions:  - LV function appears preserved with EF 60-65%. LV strain normal   but has decreased from 22% > 21% compared to last echo.   Past Medical History:  Diagnosis Date  . Anxiety   . Breast cancer, left breast ([place]) 2000   Underwent lumpectomy, chemotherapy and radiation  . Facial paralysis/Bells palsy    left side  . Family history of lung cancer   . Family history of lymphoma   . Hypertension   . Neuropathy    IN FINGERS AND TOES   RECENT INFUSION OF CHEMO  . Personal history of breast cancer 05/06/2018  . Renal disorder    just after TIA acute kidney injury  . TIA (transient ischemic attack) 03/16/2017   [facility]    Past Surgical History:  Procedure Laterality Date  . BREAST LUMPECTOMY WITH AXILLARY LYMPH NODE BIOPSY Left 2000   biopsy  . BREAST SURGERY    . PORTACATH PLACEMENT Left 04/08/2018   Procedure: INSERTION PORT-A-CATH;  Surgeon: Jovita Kussmaul, MD;  Location: [facility];  Service: General;  Laterality: Left;  . TUBAL LIGATION Bilateral 2004  . WISDOM TOOTH EXTRACTION      MEDICATIONS: No current facility-administered medications for this encounter.    Marland Kitchen aspirin EC 81 MG EC tablet  . BYSTOLIC 20 MG TABS  . cloNIDine (CATAPRES) 0.2 MG tablet  . Cyanocobalamin (VITAMIN B12) 500 MCG TABS  . ferrous sulfate 325 (65 FE) MG tablet  . lidocaine-prilocaine (EMLA) cream  . Multiple Vitamin (MULITIVITAMIN WITH MINERALS) TABS  . spironolactone (ALDACTONE) 50 MG tablet  . diphenoxylate-atropine (LOMOTIL) 2.5-0.025 MG tablet  . gabapentin (NEURONTIN) 100 MG capsule  . trimethoprim-polymyxin b (POLYTRIM) ophthalmic solution    Wynonia Musty [facility] Short Stay Center/Anesthesiology Phone [phone] 08/24/2018 1:53 PM

## 2018-08-24 NOTE — Progress Notes (Addendum)
PCP is Marda Stalker, [place]  LOV 04/2018 Did see Dr. Marquis Lunch in 04/2018, prior to her receiving chemo. Currently denies murmur, cp, sob.   Does have some neuropathy in fingertips and toes (from chemo) No residual deficits from TIA (03/2017) EKG  04/01/2018  Echo 04/08/2018 I called [facility] and spoke with Abigail Butts to have Dr. Marlou Starks to look at labs

## 2018-08-24 NOTE — Pre-Procedure Instructions (Signed)
[place] Woolen  08/24/2018      [id number] Lorina Rabon, [place] [address] [place] [address] Phone: [phone] Fax: [phone]    Your procedure is scheduled on Wednesday September 25.   Report to [facility] Admitting at 11:30 A.M.             (posted surgery time 1:32p - 4:32p)   Call this number if you have problems the morning of surgery:  [phone]   Remember:   Do not eat or drink after midnight, Tuesday.   You may drink clear liquids until 10:30 AM .  Clear liquids allowed are: Water, Juice (non-citric and without pulp), Black Coffee only and Gatorade    Take these medicines the morning of surgery with A SIP OF WATER:   Bystolic Clonidine if needed Gabapentin (neurontin)  7 days prior to surgery STOP taking any Aleve, Naproxen, Ibuprofen, Motrin, Advil, Goody's, BC's, all herbal medications, fish oil, and all vitamins  Follow your surgeon's instructions on stopping Aspirin. If no instructions were given, please call your surgeon's office.    Do not wear jewelry, make-up or nail polish.  Do not wear lotions, powders,  perfumes, or deodorant.  Do not shave 48 hours prior to surgery.  Do not bring valuables to the hospital.  [facility] is not responsible for any belongings or valuables.  Contacts, dentures or bridgework may not be worn into surgery.  Leave your suitcase in the car.  After surgery it may be brought to your room.  For patients admitted to the hospital, discharge time will be determined by your treatment team.   Special instructions:    [facility]- Preparing For Surgery  Before surgery, you can play an important role. Because skin is not sterile, your skin needs to be as free of germs as possible. You can reduce the number of germs on your skin by washing with CHG (chlorahexidine gluconate) Soap before surgery.  CHG is an antiseptic cleaner which kills germs and bonds with the skin to continue  killing germs even after washing.    Oral Hygiene is also important to reduce your risk of infection.    Remember - BRUSH YOUR TEETH THE MORNING OF SURGERY WITH YOUR REGULAR TOOTHPASTE  Please do not use if you have an allergy to CHG or antibacterial soaps. If your skin becomes reddened/irritated stop using the CHG.  Do not shave (including legs and underarms) for at least 48 hours prior to first CHG shower. It is OK to shave your face.  Please follow these instructions carefully.   1. Shower the NIGHT BEFORE SURGERY and the MORNING OF SURGERY with CHG.   2. If you chose to wash your hair, wash your hair first as usual with your normal shampoo.  3. After you shampoo, rinse your hair and body thoroughly to remove the shampoo.  4. Use CHG as you would any other liquid soap. You can apply CHG directly to the skin and wash gently with a scrungie or a clean washcloth.   5. Apply the CHG Soap to your body ONLY FROM THE NECK DOWN.  Do not use on open wounds or open sores. Avoid contact with your eyes, ears, mouth and genitals (private parts). Wash Face and genitals (private parts)  with your normal soap.  6. Wash thoroughly, paying special attention to the area where your surgery will be performed.  7. Thoroughly rinse your body with warm water from the  neck down.  8. DO NOT shower/wash with your normal soap after using and rinsing off the CHG Soap.  9. Pat yourself dry with a CLEAN TOWEL.  10. Wear CLEAN PAJAMAS to bed the night before surgery, wear comfortable clothes the morning of surgery  11. Place CLEAN SHEETS on your bed the night of your first shower and DO NOT SLEEP WITH PETS.  Day of Surgery:  Do not apply any deodorants/lotions.  Please wear clean clothes to the hospital/surgery center.    Remember to brush your teeth WITH YOUR REGULAR TOOTHPASTE.    Please read over the following fact sheets that you were given. Coughing and Deep Breathing and Surgical Site Infection  Prevention

## 2018-08-26 ENCOUNTER — Encounter (HOSPITAL_COMMUNITY): Payer: Self-pay | Admitting: Urology

## 2018-08-26 ENCOUNTER — Ambulatory Visit (HOSPITAL_COMMUNITY)
Admission: RE | Admit: 2018-08-26 | Discharge: 2018-08-27 | Disposition: A | Discharge status: Home / Self Care | Payer: 59 | Source: Ambulatory Visit | Attending: General Surgery | Admitting: General Surgery

## 2018-08-26 ENCOUNTER — Ambulatory Visit (HOSPITAL_COMMUNITY): Payer: 59 | Admitting: Physician Assistant

## 2018-08-26 ENCOUNTER — Encounter (HOSPITAL_COMMUNITY)
Admission: RE | Disposition: A | Discharge status: Home / Self Care | Payer: Self-pay | Source: Ambulatory Visit | Attending: General Surgery

## 2018-08-26 DIAGNOSIS — Z923 Personal history of irradiation: Secondary | ICD-10-CM | POA: Insufficient documentation

## 2018-08-26 DIAGNOSIS — Z8673 Personal history of transient ischemic attack (TIA), and cerebral infarction without residual deficits: Secondary | ICD-10-CM | POA: Insufficient documentation

## 2018-08-26 DIAGNOSIS — Z9221 Personal history of antineoplastic chemotherapy: Secondary | ICD-10-CM | POA: Insufficient documentation

## 2018-08-26 DIAGNOSIS — C773 Secondary and unspecified malignant neoplasm of axilla and upper limb lymph nodes: Secondary | ICD-10-CM | POA: Insufficient documentation

## 2018-08-26 DIAGNOSIS — Z791 Long term (current) use of non-steroidal anti-inflammatories (NSAID): Secondary | ICD-10-CM | POA: Insufficient documentation

## 2018-08-26 DIAGNOSIS — C50911 Malignant neoplasm of unspecified site of right female breast: Secondary | ICD-10-CM | POA: Diagnosis present

## 2018-08-26 DIAGNOSIS — Z7982 Long term (current) use of aspirin: Secondary | ICD-10-CM | POA: Insufficient documentation

## 2018-08-26 DIAGNOSIS — Z170 Estrogen receptor positive status [ER+]: Secondary | ICD-10-CM | POA: Insufficient documentation

## 2018-08-26 DIAGNOSIS — Z6841 Body Mass Index (BMI) 40.0 and over, adult: Secondary | ICD-10-CM | POA: Insufficient documentation

## 2018-08-26 DIAGNOSIS — C50411 Malignant neoplasm of upper-outer quadrant of right female breast: Secondary | ICD-10-CM | POA: Diagnosis not present

## 2018-08-26 DIAGNOSIS — I10 Essential (primary) hypertension: Secondary | ICD-10-CM | POA: Insufficient documentation

## 2018-08-26 DIAGNOSIS — Z79899 Other long term (current) drug therapy: Secondary | ICD-10-CM | POA: Insufficient documentation

## 2018-08-26 HISTORY — PX: MASTECTOMY MODIFIED RADICAL: SHX5962

## 2018-08-26 HISTORY — PX: MODIFIED MASTECTOMY: SHX5268

## 2018-08-26 LAB — POCT PREGNANCY, URINE: Preg Test, Ur: NEGATIVE

## 2018-08-26 SURGERY — MASTECTOMY, MODIFIED RADICAL
Anesthesia: General | Site: Breast | Laterality: Right

## 2018-08-26 MED ORDER — MIDAZOLAM HCL 2 MG/2ML IJ SOLN
INTRAMUSCULAR | Status: AC
  Filled 2018-08-26: qty 2

## 2018-08-26 MED ORDER — NEBIVOLOL HCL 10 MG PO TABS
20.0000 mg | ORAL_TABLET | Freq: Every day | ORAL | Status: DC
  Administered 2018-08-26 – 2018-08-27 (×2): 20 mg via ORAL
  Filled 2018-08-26 (×4): qty 2

## 2018-08-26 MED ORDER — FENTANYL CITRATE (PF) 250 MCG/5ML IJ SOLN
INTRAMUSCULAR | Status: AC
  Filled 2018-08-26: qty 5

## 2018-08-26 MED ORDER — FENTANYL CITRATE (PF) 100 MCG/2ML IJ SOLN
INTRAMUSCULAR | Status: DC | PRN
  Administered 2018-08-26 (×3): 25 ug via INTRAVENOUS
  Administered 2018-08-26 (×2): 50 ug via INTRAVENOUS

## 2018-08-26 MED ORDER — PROPOFOL 10 MG/ML IV BOLUS
INTRAVENOUS | Status: AC
  Filled 2018-08-26: qty 40

## 2018-08-26 MED ORDER — HYDROCODONE-ACETAMINOPHEN 5-325 MG PO TABS
1.0000 | ORAL_TABLET | ORAL | Status: DC | PRN
  Administered 2018-08-26: 1 via ORAL
  Filled 2018-08-26: qty 1

## 2018-08-26 MED ORDER — ARTIFICIAL TEARS OPHTHALMIC OINT
TOPICAL_OINTMENT | OPHTHALMIC | Status: AC
  Filled 2018-08-26: qty 3.5

## 2018-08-26 MED ORDER — ROCURONIUM BROMIDE 50 MG/5ML IV SOSY
PREFILLED_SYRINGE | INTRAVENOUS | Status: AC
  Filled 2018-08-26: qty 5

## 2018-08-26 MED ORDER — SODIUM CHLORIDE 0.9 % IJ SOLN
INTRAMUSCULAR | Status: AC
  Filled 2018-08-26: qty 10

## 2018-08-26 MED ORDER — ONDANSETRON HCL 4 MG/2ML IJ SOLN
INTRAMUSCULAR | Status: AC
  Filled 2018-08-26: qty 2

## 2018-08-26 MED ORDER — ONDANSETRON HCL 4 MG/2ML IJ SOLN: 4.0000 mg | Freq: Four times a day (QID) | INTRAMUSCULAR | Status: DC | PRN

## 2018-08-26 MED ORDER — MIDAZOLAM HCL 2 MG/2ML IJ SOLN
2.0000 mg | Freq: Once | INTRAMUSCULAR | Status: AC
  Administered 2018-08-26: 2 mg via INTRAVENOUS

## 2018-08-26 MED ORDER — SUCCINYLCHOLINE CHLORIDE 200 MG/10ML IV SOSY
PREFILLED_SYRINGE | INTRAVENOUS | Status: AC
  Filled 2018-08-26: qty 10

## 2018-08-26 MED ORDER — HEPARIN SODIUM (PORCINE) 5000 UNIT/ML IJ SOLN
5000.0000 [IU] | Freq: Three times a day (TID) | INTRAMUSCULAR | Status: DC
  Administered 2018-08-27: 5000 [IU] via SUBCUTANEOUS
  Filled 2018-08-26: qty 1

## 2018-08-26 MED ORDER — BUPIVACAINE-EPINEPHRINE (PF) 0.5% -1:200000 IJ SOLN
INTRAMUSCULAR | Status: DC | PRN
  Administered 2018-08-26: 40 mL

## 2018-08-26 MED ORDER — LIDOCAINE 2% (20 MG/ML) 5 ML SYRINGE
INTRAMUSCULAR | Status: DC | PRN
  Administered 2018-08-26: 100 mg via INTRAVENOUS

## 2018-08-26 MED ORDER — EPHEDRINE 5 MG/ML INJ
INTRAVENOUS | Status: AC
  Filled 2018-08-26: qty 10

## 2018-08-26 MED ORDER — CEFAZOLIN SODIUM-DEXTROSE 2-4 GM/100ML-% IV SOLN
2.0000 g | INTRAVENOUS | Status: AC
  Administered 2018-08-26: 2 g via INTRAVENOUS
  Filled 2018-08-26: qty 100

## 2018-08-26 MED ORDER — SODIUM CHLORIDE 0.9 % IV SOLN
INTRAVENOUS | Status: DC | PRN
  Administered 2018-08-26: 25 ug/min via INTRAVENOUS

## 2018-08-26 MED ORDER — GABAPENTIN 100 MG PO CAPS: 100.0000 mg | ORAL_CAPSULE | Freq: Every day | ORAL | Status: DC

## 2018-08-26 MED ORDER — ACETAMINOPHEN 500 MG PO TABS
1000.0000 mg | ORAL_TABLET | ORAL | Status: AC
  Administered 2018-08-26: 1000 mg via ORAL
  Filled 2018-08-26: qty 2

## 2018-08-26 MED ORDER — FENTANYL CITRATE (PF) 100 MCG/2ML IJ SOLN
INTRAMUSCULAR | Status: AC
  Administered 2018-08-26: 100 ug via INTRAVENOUS
  Filled 2018-08-26: qty 2

## 2018-08-26 MED ORDER — ONDANSETRON 4 MG PO TBDP: 4.0000 mg | ORAL_TABLET | Freq: Four times a day (QID) | ORAL | Status: DC | PRN

## 2018-08-26 MED ORDER — NEOSTIGMINE METHYLSULFATE 3 MG/3ML IV SOSY
PREFILLED_SYRINGE | INTRAVENOUS | Status: AC
  Filled 2018-08-26: qty 3

## 2018-08-26 MED ORDER — PHENYLEPHRINE 40 MCG/ML (10ML) SYRINGE FOR IV PUSH (FOR BLOOD PRESSURE SUPPORT)
PREFILLED_SYRINGE | INTRAVENOUS | Status: AC
  Filled 2018-08-26: qty 10

## 2018-08-26 MED ORDER — CHLORHEXIDINE GLUCONATE CLOTH 2 % EX PADS: 6.0000 | MEDICATED_PAD | Freq: Once | CUTANEOUS | Status: DC

## 2018-08-26 MED ORDER — METHOCARBAMOL 500 MG PO TABS: 500.0000 mg | ORAL_TABLET | Freq: Four times a day (QID) | ORAL | Status: DC | PRN

## 2018-08-26 MED ORDER — HYDROMORPHONE HCL 1 MG/ML IJ SOLN: 0.2500 mg | INTRAMUSCULAR | Status: DC | PRN

## 2018-08-26 MED ORDER — GABAPENTIN 300 MG PO CAPS
300.0000 mg | ORAL_CAPSULE | ORAL | Status: AC
  Administered 2018-08-26: 300 mg via ORAL
  Filled 2018-08-26: qty 1

## 2018-08-26 MED ORDER — 0.9 % SODIUM CHLORIDE (POUR BTL) OPTIME
TOPICAL | Status: DC | PRN
  Administered 2018-08-26: 1000 mL

## 2018-08-26 MED ORDER — GLYCOPYRROLATE PF 0.2 MG/ML IJ SOSY
PREFILLED_SYRINGE | INTRAMUSCULAR | Status: AC
  Filled 2018-08-26: qty 1

## 2018-08-26 MED ORDER — MIDAZOLAM HCL 2 MG/2ML IJ SOLN
INTRAMUSCULAR | Status: AC
  Administered 2018-08-26: 2 mg via INTRAVENOUS
  Filled 2018-08-26: qty 2

## 2018-08-26 MED ORDER — CELECOXIB 200 MG PO CAPS
200.0000 mg | ORAL_CAPSULE | ORAL | Status: AC
  Administered 2018-08-26: 200 mg via ORAL
  Filled 2018-08-26: qty 1

## 2018-08-26 MED ORDER — CLONIDINE HCL 0.2 MG PO TABS: 0.2000 mg | ORAL_TABLET | Freq: Every day | ORAL | Status: DC | PRN

## 2018-08-26 MED ORDER — ONDANSETRON HCL 4 MG/2ML IJ SOLN
INTRAMUSCULAR | Status: DC | PRN
  Administered 2018-08-26: 4 mg via INTRAVENOUS

## 2018-08-26 MED ORDER — LACTATED RINGERS IV SOLN
INTRAVENOUS | Status: DC
  Administered 2018-08-26 (×2): via INTRAVENOUS

## 2018-08-26 MED ORDER — FENTANYL CITRATE (PF) 100 MCG/2ML IJ SOLN
100.0000 ug | Freq: Once | INTRAMUSCULAR | Status: AC
  Administered 2018-08-26: 100 ug via INTRAVENOUS

## 2018-08-26 MED ORDER — PROPOFOL 10 MG/ML IV BOLUS
INTRAVENOUS | Status: DC | PRN
  Administered 2018-08-26: 150 mg via INTRAVENOUS
  Administered 2018-08-26: 50 mg via INTRAVENOUS
  Administered 2018-08-26: 30 mg via INTRAVENOUS

## 2018-08-26 MED ORDER — MIDAZOLAM HCL 5 MG/5ML IJ SOLN
INTRAMUSCULAR | Status: DC | PRN
  Administered 2018-08-26: 2 mg via INTRAVENOUS

## 2018-08-26 MED ORDER — FAMOTIDINE IN NACL 20-0.9 MG/50ML-% IV SOLN
20.0000 mg | Freq: Two times a day (BID) | INTRAVENOUS | Status: DC
  Administered 2018-08-26: 20 mg via INTRAVENOUS
  Filled 2018-08-26: qty 50

## 2018-08-26 MED ORDER — MORPHINE SULFATE (PF) 2 MG/ML IV SOLN: 1.0000 mg | INTRAVENOUS | Status: DC | PRN

## 2018-08-26 MED ORDER — SODIUM CHLORIDE 0.9 % IV SOLN
INTRAVENOUS | Status: DC
  Administered 2018-08-26: 19:00:00 via INTRAVENOUS

## 2018-08-26 MED ORDER — SCOPOLAMINE 1 MG/3DAYS TD PT72
MEDICATED_PATCH | TRANSDERMAL | Status: DC | PRN
  Administered 2018-08-26: 1 via TRANSDERMAL

## 2018-08-26 MED ORDER — CYANOCOBALAMIN 500 MCG PO TABS
500.0000 ug | ORAL_TABLET | Freq: Every day | ORAL | Status: DC
  Administered 2018-08-26: 500 ug via ORAL
  Filled 2018-08-26 (×2): qty 1

## 2018-08-26 MED ORDER — LIDOCAINE 2% (20 MG/ML) 5 ML SYRINGE
INTRAMUSCULAR | Status: AC
  Filled 2018-08-26: qty 5

## 2018-08-26 MED ORDER — PROPOFOL 10 MG/ML IV BOLUS
INTRAVENOUS | Status: AC
  Filled 2018-08-26: qty 20

## 2018-08-26 MED ORDER — DEXAMETHASONE SODIUM PHOSPHATE 10 MG/ML IJ SOLN
INTRAMUSCULAR | Status: DC | PRN
  Administered 2018-08-26: 10 mg via INTRAVENOUS

## 2018-08-26 MED ORDER — PHENYLEPHRINE HCL 10 MG/ML IJ SOLN
INTRAMUSCULAR | Status: DC | PRN
  Administered 2018-08-26 (×2): 40 ug via INTRAVENOUS
  Administered 2018-08-26: 80 ug via INTRAVENOUS

## 2018-08-26 MED ORDER — SPIRONOLACTONE 50 MG PO TABS
50.0000 mg | ORAL_TABLET | Freq: Every day | ORAL | Status: DC
  Administered 2018-08-26 – 2018-08-27 (×2): 50 mg via ORAL
  Filled 2018-08-26 (×2): qty 1

## 2018-08-26 SURGICAL SUPPLY — 39 items
APPLIER CLIP 9.375 MED OPEN (MISCELLANEOUS) ×4
BINDER BREAST XXLRG (GAUZE/BANDAGES/DRESSINGS) ×2 IMPLANT
BIOPATCH RED 1 DISK 7.0 (GAUZE/BANDAGES/DRESSINGS) ×2 IMPLANT
CANISTER SUCT 3000ML PPV (MISCELLANEOUS) ×2 IMPLANT
CHLORAPREP W/TINT 26ML (MISCELLANEOUS) ×2 IMPLANT
CLIP APPLIE 9.375 MED OPEN (MISCELLANEOUS) ×2 IMPLANT
COVER SURGICAL LIGHT HANDLE (MISCELLANEOUS) ×2 IMPLANT
DERMABOND ADVANCED (GAUZE/BANDAGES/DRESSINGS) ×1
DERMABOND ADVANCED .7 DNX12 (GAUZE/BANDAGES/DRESSINGS) ×1 IMPLANT
DEVICE DISSECT PLASMABLAD 3.0S (MISCELLANEOUS) ×1 IMPLANT
DRAIN CHANNEL 19F RND (DRAIN) ×4 IMPLANT
DRAPE CHEST BREAST 15X10 FENES (DRAPES) ×2 IMPLANT
DRSG TEGADERM 4X4.75 (GAUZE/BANDAGES/DRESSINGS) ×2 IMPLANT
ELECT REM PT RETURN 9FT ADLT (ELECTROSURGICAL) ×2
ELECTRODE REM PT RTRN 9FT ADLT (ELECTROSURGICAL) ×1 IMPLANT
EVACUATOR SILICONE 100CC (DRAIN) ×4 IMPLANT
GAUZE SPONGE 4X4 12PLY STRL (GAUZE/BANDAGES/DRESSINGS) ×2 IMPLANT
GLOVE BIOGEL PI IND STRL 7.0 (GLOVE) ×1 IMPLANT
GLOVE BIOGEL PI INDICATOR 7.0 (GLOVE) ×1
GLOVE SURG SS PI 7.0 STRL IVOR (GLOVE) ×2 IMPLANT
GOWN STRL REUS W/ TWL LRG LVL3 (GOWN DISPOSABLE) ×2 IMPLANT
GOWN STRL REUS W/TWL LRG LVL3 (GOWN DISPOSABLE) ×2
KIT BASIN OR (CUSTOM PROCEDURE TRAY) ×2 IMPLANT
KIT TURNOVER KIT B (KITS) ×2 IMPLANT
NS IRRIG 1000ML POUR BTL (IV SOLUTION) ×2 IMPLANT
PACK GENERAL/GYN (CUSTOM PROCEDURE TRAY) ×2 IMPLANT
PAD ABD 8X10 STRL (GAUZE/BANDAGES/DRESSINGS) ×2 IMPLANT
PAD ARMBOARD 7.5X6 YLW CONV (MISCELLANEOUS) ×4 IMPLANT
PLASMABLADE 3.0S (MISCELLANEOUS) ×2
SPECIMEN JAR X LARGE (MISCELLANEOUS) ×2 IMPLANT
SUT ETHILON 3 0 FSL (SUTURE) ×4 IMPLANT
SUT MON AB 4-0 PC3 18 (SUTURE) ×2 IMPLANT
SUT SILK 2 0 (SUTURE) ×1
SUT SILK 2-0 18XBRD TIE 12 (SUTURE) ×1 IMPLANT
SUT VIC AB 0 CT1 27 (SUTURE) ×1
SUT VIC AB 0 CT1 27XBRD ANBCTR (SUTURE) ×1 IMPLANT
SUT VIC AB 3-0 SH 18 (SUTURE) ×6 IMPLANT
TOWEL OR 17X26 10 PK STRL BLUE (TOWEL DISPOSABLE) ×2 IMPLANT
TUBE CONNECTING 12X1/4 (SUCTIONS) ×4 IMPLANT

## 2018-08-26 NOTE — Transfer of Care (Signed)
Immediate Anesthesia Transfer of Care Note  Patient: [name] [name]  Procedure(s) Performed: MASTECTOMY MODIFIED RADICAL (Right Breast)  Patient Location: PACU  Anesthesia Type:GA combined with regional for post-op pain  Level of Consciousness: drowsy and patient cooperative  Airway & Oxygen Therapy: Patient Spontanous Breathing and Patient connected to face mask oxygen  Post-op Assessment: Report given to RN and Post -op Vital signs reviewed and stable  Post vital signs: Reviewed and stable  Last Vitals:  Vitals Value Taken Time  BP 162/104 08/26/2018  3:49 PM  Temp    Pulse 73 08/26/2018  3:51 PM  Resp 21 08/26/2018  3:51 PM  SpO2 100 % 08/26/2018  3:51 PM  Vitals shown include unvalidated device data.  Last Pain:  Vitals:   08/26/18 1155  TempSrc:   PainSc: 0-No pain         Complications: No apparent anesthesia complications

## 2018-08-26 NOTE — Progress Notes (Signed)
Received patient from PACU. Patient alert and oriented x4. Dressing clean dry and intact. Patient oriented to call bell and room. Will continue to monitor.

## 2018-08-26 NOTE — Anesthesia Procedure Notes (Signed)
Anesthesia Regional Block: Pectoralis block   Pre-Anesthetic Checklist: ,, timeout performed, Correct Patient, Correct Site, Correct Laterality, Correct Procedure, Correct Position, site marked, Risks and benefits discussed, pre-op evaluation,  At surgeon's request and post-op pain management  Laterality: Right  Prep: Maximum Sterile Barrier Precautions used, chloraprep       Needles:  Injection technique: Single-shot  Needle Type: Echogenic Stimulator Needle     Needle Length: 9cm  Needle Gauge: 21     Additional Needles:   Procedures:,,,, ultrasound used (permanent image in chart),,,,  Narrative:  Start time: 08/26/2018 12:36 PM End time: 08/26/2018 12:46 PM Injection made incrementally with aspirations every 5 mL. Anesthesiologist: Roderic Palau, MD  Additional Notes: 2% Lidocaine skin wheel.

## 2018-08-26 NOTE — Anesthesia Preprocedure Evaluation (Addendum)
Anesthesia Evaluation  Patient identified by MRN, date of birth, ID band Patient awake    Reviewed: Allergy & Precautions, H&P , NPO status , Patient's Chart, lab work & pertinent test results, reviewed documented beta blocker date and time   History of Anesthesia Complications (+) Emergence Delirium  Airway Mallampati: II  TM Distance: >3 FB Neck ROM: full    Dental no notable dental hx.    Pulmonary neg pulmonary ROS,    Pulmonary exam normal breath sounds clear to auscultation       Cardiovascular Exercise Tolerance: Good hypertension, Pt. on medications and Pt. on home beta blockers negative cardio ROS   Rhythm:regular Rate:Normal     Neuro/Psych Anxiety negative neurological ROS  negative psych ROS   GI/Hepatic negative GI ROS, Neg liver ROS,   Endo/Other  negative endocrine ROSMorbid obesity  Renal/GU Renal InsufficiencyRenal disease  negative genitourinary   Musculoskeletal   Abdominal   Peds  Hematology negative hematology ROS (+)   Anesthesia Other Findings   Reproductive/Obstetrics negative OB ROS                           Anesthesia Physical Anesthesia Plan  ASA: III  Anesthesia Plan: General   Post-op Pain Management:  Regional for Post-op pain   Induction: Intravenous  PONV Risk Score and Plan: 4 or greater and Ondansetron, Dexamethasone, Midazolam and Scopolamine patch - Pre-op  Airway Management Planned: LMA  Additional Equipment:   Intra-op Plan:   Post-operative Plan: Extubation in OR  Informed Consent: I have reviewed the patients History and Physical, chart, labs and discussed the procedure including the risks, benefits and alternatives for the proposed anesthesia with the patient or authorized representative who has indicated his/her understanding and acceptance.   Dental Advisory Given  Plan Discussed with: CRNA  Anesthesia Plan Comments:        Anesthesia Quick Evaluation

## 2018-08-26 NOTE — Interval H&P Note (Signed)
History and Physical Interval Note:  08/26/2018 1:03 PM  [name] [name]  has presented today for surgery, with the diagnosis of RIGHT BREAST CANCER  The various methods of treatment have been discussed with the patient and family. After consideration of risks, benefits and other options for treatment, the patient has consented to  Procedure(s): MASTECTOMY MODIFIED RADICAL (Right) as a surgical intervention .  The patient's history has been reviewed, patient examined, no change in status, stable for surgery.  I have reviewed the patient's chart and labs.  Questions were answered to the patient's satisfaction.     Autumn Messing III

## 2018-08-26 NOTE — Anesthesia Procedure Notes (Signed)
Procedure Name: LMA Insertion Date/Time: 08/26/2018 1:49 PM Performed by: Scheryl Darter, CRNA Pre-anesthesia Checklist: Patient identified, Emergency Drugs available, Suction available and Patient being monitored Patient Re-evaluated:Patient Re-evaluated prior to induction Oxygen Delivery Method: Circle System Utilized Preoxygenation: Pre-oxygenation with 100% oxygen Induction Type: IV induction Ventilation: Mask ventilation without difficulty LMA: LMA inserted LMA Size: 4.0 Number of attempts: 1 Placement Confirmation: positive ETCO2 Tube secured with: Tape Dental Injury: Teeth and Oropharynx as per pre-operative assessment

## 2018-08-26 NOTE — Op Note (Signed)
08/26/2018  3:34 PM  PATIENT:  [name] [name]  47 y.o. female  PRE-OPERATIVE DIAGNOSIS:  RIGHT BREAST CANCER  POST-OPERATIVE DIAGNOSIS:  RIGHT BREAST CANCER  PROCEDURE:  Procedure(s): MASTECTOMY MODIFIED RADICAL (Right)  SURGEON:  Surgeon(s) and Role:    * Jovita Kussmaul, MD - Primary  PHYSICIAN ASSISTANT:   ASSISTANTS: Sharyn Dross, RNFA   ANESTHESIA:   general  EBL:  25 mL   BLOOD ADMINISTERED:none  DRAINS: (2) Schachter-Pratt drain(s) with closed bulb suction in the prepectoral space   LOCAL MEDICATIONS USED:  NONE  SPECIMEN:  Source of Specimen:  right breast and axillary contents  DISPOSITION OF SPECIMEN:  PATHOLOGY  COUNTS:  YES  TOURNIQUET:  * No tourniquets in log *  DICTATION: .Dragon Dictation   After informed consent was obtained the patient was brought to the operating room and placed in the supine position on the operating table.  After adequate induction of general anesthesia the patient's right chest, breast, and axillary area were prepped with ChloraPrep, allowed to dry, and draped in usual sterile manner.  An appropriate timeout was performed.  The patient previously underwent neoadjuvant chemotherapy but still had multiple abnormal lymph nodes and multifocal disease in the breast.  She opted for a modified radical mastectomy.  An elliptical incision was made around the nipple and areolar complex with a 15 blade knife in order to minimize the excess skin.  The incision was carried through the skin and subcutaneous tissue sharply with the plasma blade.  Breast hooks were then used to elevate the skin flaps anteriorly towards the saline.  Thin skin flaps were then created circumferentially by dissection with the plasma blade between the breast tissue in the subcutaneous fat.  This dissection was carried all the way to the chest wall circumferentially.  Next the breast was removed from the pectoralis muscle with the pectoralis fashion in a top-down fashion.  Several  blood vessels were encountered and the pectoralis muscle that were controlled with figure-of-eight 3-0 Vicryl stitches.  Laterally the dissection was carried along the serratus muscle of the chest wall.  The dissection was then carried superiorly until we were able to identify the right axillary vein.  The latissimus muscle laterally had previously been identified.  The lymph node contents within these boundaries was dissected out by blunt right angle dissection.  Several small vessels and lymphatics were controlled with clips.  One larger vessel coming off the axillary vein was controlled with a 2-0 silk tie.  The long thoracic and thoracodorsal nerves were identified and spared.  Once this dissection was complete the entire breast and axillary contents were sent to pathology en bloc for further evaluation.  Hemostasis was achieved using the plasma blade.  The wound was irrigated with copious amounts of saline.  The lateral axillary tissue was reattached to the chest wall with a running 0 Vicryl stitch.  2 small stab incisions were made near the anterior axillary line inferior to the operative bed with a 15 blade knife.  A tonsil clamp was placed through each of these openings and used to bring a 19 [place] round Blake drains into the operative bed.  The lateral drain was placed in the axilla and the medial drain was curled along the chest wall.  The drains were anchored to the skin with 3-0 nylon stitches.  The skin flaps appeared healthy and viable.  The superior and inferior flaps were grossly reapproximated with interrupted 3-0 Vicryl stitches.  The skin was then closed with  a running 4-0 Monocryl subcuticular stitch.  Dermabond dressings were applied.  The patient tolerated the procedure well.  At the end of the case all needle sponge and instrument counts were correct.  The patient was then awakened and taken to recovery in stable condition.  PLAN OF CARE: Admit for overnight observation  PATIENT  DISPOSITION:  PACU - hemodynamically stable.   Delay start of Pharmacological VTE agent (>24hrs) due to surgical blood loss or risk of bleeding: no

## 2018-08-26 NOTE — H&P (Signed)
[name] [name]  Location: [facility] Patient #: [id number] DOB: Dec 20, 1970 Married / Language: English / Race: Black or African American Female   History of Present Illness  The patient is a 47 year old female who presents for a follow-up for Breast cancer. 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 She has been receiving neoadjuvant chemotherapy and her last dose was on August 29. The cancer was ER/PR positive and HER-2 positive with a Ki-67 of 80%. She also had about 6 abnormal-looking lymph nodes on her original imaging. On her most recent MRI of the cancer has shrunk fairly significantly but she still has some mass in the upper outer right breast as well as some enhancement along the pectoralis muscle and at least 3 abnormal-looking lymph nodes. During this period of time she was also found to have elevated calcium levels and elevated parathyroid hormone levels. She will need a sestamibi scan to work this up   Allergies  Latex Exam Gloves *MEDICAL DEVICES AND SUPPLIES*  Itching, Rash. AmLODIPine Bes+SyrSpend SF *CALCIUM CHANNEL BLOCKERS*  Swelling. Lisinopril & Diet Manage Prod *ANTIHYPERTENSIVES*  Swelling. Adhesive Tape *MEDICAL DEVICES AND SUPPLIES*  Itching, Rash.  Medication History Tylenol Extra Strength ('500MG'$  Tablet, Oral) Active. Aspirin EC ('81MG'$  Tablet DR, Oral) Active. Bystolic ('20MG'$  Tablet, Oral) Active. CloNIDine (0.'2MG'$ /24HR Patch Weekly, Transdermal) Active. Ferrous Sulfate (325 (65 Fe)MG Tablet DR, Oral) Active. Meclizine HCl (12.'5MG'$  Tablet, Oral) Active. Multiple Vitamin (1 (one) Oral) Active. Naproxen Sodium ('220MG'$  Tablet, Oral) Active. Aldactone ('50MG'$  Tablet, Oral) Active. Medications Reconciled    Review of Systems  General Present- Night Sweats and Weight Gain. Not Present- Appetite Loss, Chills, Fatigue, Fever and Weight Loss. Skin Not Present- Change in Wart/Mole,  Dryness, Hives, Jaundice, New Lesions, Non-Healing Wounds, Rash and Ulcer. HEENT Present- Wears glasses/contact lenses. Not Present- Earache, Hearing Loss, Hoarseness, Nose Bleed, Oral Ulcers, Ringing in the Ears, Seasonal Allergies, Sinus Pain, Sore Throat, Visual Disturbances and Yellow Eyes. Respiratory Present- Snoring. Not Present- Bloody sputum, Chronic Cough, Difficulty Breathing and Wheezing. Breast Present- Breast Pain. Not Present- Breast Mass, Nipple Discharge and Skin Changes. Cardiovascular Not Present- Chest Pain, Difficulty Breathing Lying Down, Leg Cramps, Palpitations, Rapid Heart Rate, Shortness of Breath and Swelling of Extremities. Gastrointestinal Not Present- Abdominal Pain, Bloating, Bloody Stool, Change in Bowel Habits, Chronic diarrhea, Constipation, Difficulty Swallowing, Excessive gas, Gets full quickly at meals, Hemorrhoids, Indigestion, Nausea, Rectal Pain and Vomiting. Female Genitourinary Not Present- Frequency, Nocturia, Painful Urination, Pelvic Pain and Urgency. Musculoskeletal Not Present- Back Pain, Joint Pain, Joint Stiffness, Muscle Pain, Muscle Weakness and Swelling of Extremities. Neurological Not Present- Decreased Memory, Fainting, Headaches, Numbness, Seizures, Tingling, Tremor, Trouble walking and Weakness. Psychiatric Not Present- Anxiety, Bipolar, Change in Sleep Pattern, Depression, Fearful and Frequent crying. Endocrine Not Present- Cold Intolerance, Excessive Hunger, Hair Changes, Heat Intolerance, Hot flashes and New Diabetes. Hematology Not Present- Blood Thinners, Easy Bruising, Excessive bleeding, Gland problems, HIV and Persistent Infections.  Vitals  Weight: 213.2 lb Height: 59in Body Surface Area: 1.89 m Body Mass Index: 43.06 kg/m  Temp.: 98.25F(Oral)  Pulse: 83 (Regular)  BP: 134/82 (Sitting, Left Arm, Standard)       Physical Exam  General Mental Status-Alert. General Appearance-Consistent with stated  age. Hydration-Well hydrated. Voice-Normal.  Head and Neck Head-normocephalic, atraumatic with no lesions or palpable masses. Trachea-midline. Thyroid Gland Characteristics - normal size and consistency.  Eye Eyeball - Bilateral-Extraocular movements intact. Sclera/Conjunctiva - Bilateral-No scleral icterus.  Chest and Lung Exam Chest and lung exam reveals -quiet, even and easy respiratory effort with no use of accessory muscles and on auscultation, normal breath sounds, no adventitious sounds and normal vocal resonance. Inspection Chest Wall - Normal. Back - normal.  Breast Note: There is a palpable mass in the UOQ of right breast. It is mobile with no skin changes. There are palpable enlarged lymph nodes in the right axillary but not overly bulky. Left breast is contracted from previous lumpectomy and radiation   Cardiovascular Cardiovascular examination reveals -normal heart sounds, regular rate and rhythm with no murmurs and normal pedal pulses bilaterally.  Abdomen Inspection Inspection of the abdomen reveals - No Hernias. Skin - Scar - no surgical scars. Palpation/Percussion Palpation and Percussion of the abdomen reveal - Soft, Non Tender, No Rebound tenderness, No Rigidity (guarding) and No hepatosplenomegaly. Auscultation Auscultation of the abdomen reveals - Bowel sounds normal.  Neurologic Neurologic evaluation reveals -alert and oriented x 3 with no impairment of recent or remote memory. Mental Status-Normal.  Musculoskeletal Normal Exam - Left-Upper Extremity Strength Normal and Lower Extremity Strength Normal. Normal Exam - Right-Upper Extremity Strength Normal and Lower Extremity Strength Normal.  Lymphatic Head & Neck  General Head & Neck Lymphatics: Bilateral - Description - Normal. Axillary  General Axillary Region: Bilateral - Description - Normal. Tenderness - Non Tender. Femoral & Inguinal  Generalized Femoral & Inguinal  Lymphatics: Bilateral - Description - Normal. Tenderness - Non Tender.    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 She has had some response to neoadjuvant therapy but still has at least 2 areas of enhancement in the right breast with at least 3 abnormal-looking lymph nodes. Because of the extent of residual disease I have talked to her about the options and she favors modified radical mastectomy. I think this is a very reasonable and wise choice for what she has. She is also considering reconstruction so we will go ahead and refer her to plastic surgery today. I have discussed with her in detail the risks and benefits of the operation as well as some of the technical aspects and she understands and wishes to proceed. She also apparently has developed hyperparathyroidism. We will also obtain a sestamibi scan and we will deal with this after her breast cancer has been treated. Current Plans Referred to Surgery - Plastic, for evaluation and follow up (Plastic Surgery). Routine. HYPERTHYROIDISM (E05.90) Current Plans PARATHYROIDECTOMY, WITH PARATHYROID SCINTIGRAPHY USING TC-66M SESTAMIBI [phone])

## 2018-08-27 ENCOUNTER — Other Ambulatory Visit: Payer: Self-pay

## 2018-08-27 ENCOUNTER — Encounter (HOSPITAL_COMMUNITY): Payer: Self-pay | Admitting: General Practice

## 2018-08-27 DIAGNOSIS — C50411 Malignant neoplasm of upper-outer quadrant of right female breast: Secondary | ICD-10-CM | POA: Diagnosis not present

## 2018-08-27 MED ORDER — HYDROCODONE-ACETAMINOPHEN 5-325 MG PO TABS: 1.0000 | ORAL_TABLET | Freq: Four times a day (QID) | ORAL | 0 refills | Status: DC | PRN

## 2018-08-27 MED ORDER — METHOCARBAMOL 500 MG PO TABS: 500.0000 mg | ORAL_TABLET | Freq: Four times a day (QID) | ORAL | 1 refills | Status: DC | PRN

## 2018-08-27 NOTE — Progress Notes (Signed)
1 Day Post-Op   Subjective/Chief Complaint: No complaints   Objective: Vital signs in last 24 hours: Temp:  [97.6 F (36.4 C)-98.7 F (37.1 C)] 97.9 F (36.6 C) (09/26 0605) Pulse Rate:  [68-91] 69 (09/26 0605) Resp:  [16-25] 18 (09/26 0605) BP: (129-174)/(68-102) 149/84 (09/26 0605) SpO2:  [94 %-100 %] 100 % (09/26 0605) Last BM Date: 08/25/18  Intake/Output from previous day: 09/25 0701 - 09/26 0700 In: 1766.6 [P.O.:240; I.V.:1326.6; IV Piggyback:200] Out: 175 [Drains:150; Blood:25] Intake/Output this shift: No intake/output data recorded.  General appearance: alert and cooperative Resp: clear to auscultation bilaterally Chest wall: skin flaps look good Cardio: regular rate and rhythm GI: soft, non-tender; bowel sounds normal; no masses,  no organomegaly  Lab Results:  No results for input(s): WBC, HGB, HCT, PLT in the last 72 hours. BMET No results for input(s): NA, K, CL, CO2, GLUCOSE, BUN, CREATININE, CALCIUM in the last 72 hours. PT/INR No results for input(s): LABPROT, INR in the last 72 hours. ABG No results for input(s): PHART, HCO3 in the last 72 hours.  Invalid input(s): PCO2, PO2  Studies/Results: No results found.  Anti-infectives: Anti-infectives (From admission, onward)   Start     Dose/Rate Route Frequency Ordered Stop   08/26/18 1145  ceFAZolin (ANCEF) IVPB 2g/100 mL premix     2 g 200 mL/hr over 30 Minutes Intravenous On call to O.R. 08/26/18 1132 08/26/18 1345      Assessment/Plan: s/p Procedure(s): MASTECTOMY MODIFIED RADICAL (Right) Advance diet Discharge  LOS: 0 days    Autumn Messing III 08/27/2018

## 2018-08-27 NOTE — Anesthesia Postprocedure Evaluation (Signed)
Anesthesia Post Note  Patient: [name] [name]  Procedure(s) Performed: MASTECTOMY MODIFIED RADICAL (Right Breast)     Patient location during evaluation: PACU Anesthesia Type: General Level of consciousness: awake and alert Pain management: pain level controlled Vital Signs Assessment: post-procedure vital signs reviewed and stable Respiratory status: spontaneous breathing, nonlabored ventilation, respiratory function stable and patient connected to nasal cannula oxygen Cardiovascular status: blood pressure returned to baseline and stable Postop Assessment: no apparent nausea or vomiting Anesthetic complications: no    Last Vitals:  Vitals:   08/27/18 0240 08/27/18 0605  BP: (!) 143/85 (!) 149/84  Pulse: 68 69  Resp: 17 18  Temp: 36.6 C 36.6 C  SpO2: 100% 100%    Last Pain:  Vitals:   08/27/18 0605  TempSrc: Oral  PainSc:                  Lidia Collum

## 2018-08-27 NOTE — Progress Notes (Signed)
[place] [facility] to be D/C'd  per MD order. Discussed with the patient and all questions fully answered.  VSS, Skin clean, dry and intact without evidence of skin break down, no evidence of skin tears noted.  IV catheter discontinued intact. Site without signs and symptoms of complications. Dressing and pressure applied.  An After Visit Summary was printed and given to the patient. Patient received prescription.  D/c education completed with patient/family including follow up instructions, medication list, d/c activities limitations if indicated, with other d/c instructions as indicated by MD - patient able to verbalize understanding, all questions fully answered.   Patient instructed to return to ED, call 911, or call MD for any changes in condition.   Patient to be escorted via [place], and D/C home via private auto.  Franklyn Lor, RN

## 2018-08-31 ENCOUNTER — Other Ambulatory Visit: Payer: Self-pay | Admitting: Oncology

## 2018-09-08 NOTE — Progress Notes (Signed)
Location of Breast Cancer:Upper outer quadrant of right breast in female  Histology per Pathology Report:  AL DIAGNOSIS Diagnosis 03-16-18 1. Breast, right, needle core biopsy, 11 o'clock - INVASIVE DUCTAL CARCINOMA, GRADE II. SEE NOTE. - DUCTAL CARCINOMA IN SITU (DCIS), HIGH GRADE WITH NECROSIS AND CALCIFICATIONS. 2. 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 3. Breast, right, needle core biopsy, 12 o'clock - INVASIVE DUCTAL CARCINOMA, GRADE II. SEE NOTE. - DUCTAL CARCINOMA IN SITU (DCIS), HIGH GRADE WITH NECROSIS AND CALCIFICATIONS.  Clinical Information Diagnosis Note 1. 1, 3. Carcinoma measures 0.5 cm and 0.7 cm in parts 1 and 3 respectively and histomorphologically very similar. Metastatic carcinoma measures 1.0 cm in greatest dimension. Dr Saralyn Pilar has reviewed this case and concurs with the above diagnosis. Dr Marcelo Baldy was notified on 03/17/2018. Breast prognostic profile is pending and will be reported in an addendum. (NK:ecj 03/17/2018)    Receptor Status: ER(90 % +), PR (40% +), Her2-neu (+), Ki-(80 %)  Did patient present with symptoms (if so, please note symptoms) or was this found on screening mammography?:She recently had a palpated a lump with fullness in the right breast in the upper outer quadrant 01-15-18.  She underwent diagnostic imaging which revealed a  4.6 cm mass at 10:30 in the right breast, and a 1.2 cm mass at 12:00.  Past/Anticipated interventions by surgeon, if any: FINAL DIAGNOSIS Diagnosis 08-26-18 Dr. Autumn Messing Breast, modified radical mastectomy , right with axillary contents - INVASIVE DUCTAL CARCINOMA, GRADE III/III, SPANNING 2.5 CM. - DUCTAL CARCINOMA IN SITU, HIGH GRADE. 1 of 4 FINAL for [place], [place] [phone]) Diagnosis(continued) - LYMPHOVASCULAR INVASION IS IDENTIFIED. - METASTATIC CARCINOMA IN 2 OF 5 LYMPH NODES (2/5), SEE COMMENT. - THE SURGICAL RESECTION MARGINS ARE NEGATIVE FOR  CARCINOMA. - SEE ONCOLOGY TABLE BELOW. Microscopic Comment INVASIVE CARCINOMA OF THE BREAST: STATUS POST NEOADJUVANT TREATMENT Resection Procedure: Modified radical mastectomy.  Receptor Status: ER(70 % +), PR (30 % +), Her2-neu (-), Ki-(80 %)  Past/Anticipated interventions by medical oncology, if any: Dr. Jana Hakim  Chemotherapy  neoadjuvant chemotherapy consisting of carboplatin, docetaxel, trastuzumab and Pertuzumab every 3 weeks x 6 starting 04/16/2018             (a) pertuzumab held beginning with cycle 2 because of diarrhea trastuzumab to be continued to a total of 6 months             (a) echocardiogram on 06/29/2018 showed an ejection fraction in the 65-70% range  05-06-18 Genetic Testing The clinical significance of this result is uncertain  Bone scan and chest CT scan negative for metastases except for a thoracic spine lytic lesion of concern  Adjuvant radiation to the right breast planned  Antiestrogens to start at the completion of local treatment   Repeat echocardiogram sometime in October  Lymphedema issues, if any: No ROM to right arm good.  Will see PT for evaluation 09-29-18. Skin to right breast healing without signs of infection. Saw Dr. Autumn Messing last Wednesday.  Pain issues, if any:  03/11/18 right breast not taking any pain medication  SAFETY ISSUES:No Prior radiation? :2000 Left breast Dr. Lucia Gaskins at [facility] in [place] Pacemaker/ICD? : No Possible current pregnancy?:No Is the patient on methotrexate? : No  GYNECOLOGIC HISTORY:  No LMP recorded. (Menstrual status:01-03-1- Perimenopausal). Menarche: 47 years old Age at first live birth: 47 years old She is GXP3.  Adopted one child. She is no longer having periods, which were irregular and light. Her LMP  was  January 2019 and  December 2018. She is not having hot flashes. The patient used oral contraceptive from [phone] and the Depo-provera shot from [phone] with no complications. She never used  HRT  Current Complaints / other details: Family history of lung cancer,family history history of lymphoma   (1) status post left lumpectomy in 2000 for a (?) T2N0 breast cancer,             (a) status post adjuvant chemotherapy             (b) status post adjuvant radiation             (c) did not receive antiestrogens    Wt Readings from Last 3 Encounters:  09/15/18 209 lb 6.4 oz (95 kg)  09/10/18 207 lb 3.2 oz (94 kg)  08/24/18 208 lb 1.6 oz (94.4 kg)  BP (!) 155/29 (BP Location: Left Arm)   Pulse 72   Temp 98.5 F (36.9 C) (Oral)   Resp 20   Ht '4\' 11"'$  (1.499 m)   Wt 209 lb 6.4 oz (95 kg)   SpO2 100%   BMI 42.29 kg/m  Georgena Spurling, RN 09/08/2018,4:45 PM

## 2018-09-10 ENCOUNTER — Telehealth: Payer: Self-pay | Admitting: Oncology

## 2018-09-10 ENCOUNTER — Inpatient Hospital Stay (HOSPITAL_BASED_OUTPATIENT_CLINIC_OR_DEPARTMENT_OTHER): Payer: 59 | Admitting: Adult Health

## 2018-09-10 ENCOUNTER — Inpatient Hospital Stay: Payer: 59

## 2018-09-10 ENCOUNTER — Inpatient Hospital Stay: Payer: 59 | Attending: Oncology

## 2018-09-10 ENCOUNTER — Encounter: Payer: Self-pay | Admitting: Adult Health

## 2018-09-10 VITALS — BP 117/99 | HR 68 | Temp 98.6°F | Resp 18 | Ht 59.0 in | Wt 207.2 lb

## 2018-09-10 DIAGNOSIS — Z170 Estrogen receptor positive status [ER+]: Secondary | ICD-10-CM

## 2018-09-10 DIAGNOSIS — C50411 Malignant neoplasm of upper-outer quadrant of right female breast: Secondary | ICD-10-CM

## 2018-09-10 DIAGNOSIS — Z801 Family history of malignant neoplasm of trachea, bronchus and lung: Secondary | ICD-10-CM

## 2018-09-10 DIAGNOSIS — Z95828 Presence of other vascular implants and grafts: Secondary | ICD-10-CM

## 2018-09-10 DIAGNOSIS — G629 Polyneuropathy, unspecified: Secondary | ICD-10-CM | POA: Diagnosis not present

## 2018-09-10 DIAGNOSIS — Z5112 Encounter for antineoplastic immunotherapy: Secondary | ICD-10-CM | POA: Insufficient documentation

## 2018-09-10 DIAGNOSIS — C50811 Malignant neoplasm of overlapping sites of right female breast: Secondary | ICD-10-CM

## 2018-09-10 DIAGNOSIS — Z807 Family history of other malignant neoplasms of lymphoid, hematopoietic and related tissues: Secondary | ICD-10-CM

## 2018-09-10 DIAGNOSIS — Z9012 Acquired absence of left breast and nipple: Secondary | ICD-10-CM

## 2018-09-10 LAB — COMPREHENSIVE METABOLIC PANEL
ALT: 16 U/L (ref 0–44)
AST: 16 U/L (ref 15–41)
Albumin: 3.4 g/dL — ABNORMAL LOW (ref 3.5–5.0)
Alkaline Phosphatase: 91 U/L (ref 38–126)
Anion gap: 10 (ref 5–15)
BUN: 23 mg/dL — ABNORMAL HIGH (ref 6–20)
CO2: 26 mmol/L (ref 22–32)
Calcium: 11.1 mg/dL — ABNORMAL HIGH (ref 8.9–10.3)
Chloride: 106 mmol/L (ref 98–111)
Creatinine, Ser: 1.37 mg/dL — ABNORMAL HIGH (ref 0.44–1.00)
GFR calc Af Amer: 53 mL/min — ABNORMAL LOW (ref >60)
GFR calc non Af Amer: 45 mL/min — ABNORMAL LOW (ref >60)
Glucose, Bld: 110 mg/dL — ABNORMAL HIGH (ref 70–99)
Potassium: 4.2 mmol/L (ref 3.5–5.1)
Sodium: 142 mmol/L (ref 135–145)
Total Bilirubin: 0.2 mg/dL — ABNORMAL LOW (ref 0.3–1.2)
Total Protein: 7 g/dL (ref 6.5–8.1)

## 2018-09-10 LAB — CBC WITH DIFFERENTIAL/PLATELET
Abs Immature Granulocytes: 0.02 10*3/uL (ref 0.00–0.07)
Basophils Absolute: 0 10*3/uL (ref 0.0–0.1)
Basophils Relative: 0 %
Eosinophils Absolute: 0.1 10*3/uL (ref 0.0–0.5)
Eosinophils Relative: 2 %
HCT: 31.2 % — ABNORMAL LOW (ref 36.0–46.0)
Hemoglobin: 9.7 g/dL — ABNORMAL LOW (ref 12.0–15.0)
Immature Granulocytes: 0 %
Lymphocytes Relative: 24 %
Lymphs Abs: 1.1 10*3/uL (ref 0.7–4.0)
MCH: 29.8 pg (ref 26.0–34.0)
MCHC: 31.1 g/dL (ref 30.0–36.0)
MCV: 96 fL (ref 80.0–100.0)
Monocytes Absolute: 0.6 10*3/uL (ref 0.1–1.0)
Monocytes Relative: 14 %
Neutro Abs: 2.7 10*3/uL (ref 1.7–7.7)
Neutrophils Relative %: 60 %
Platelets: 168 10*3/uL (ref 150–400)
RBC: 3.25 MIL/uL — ABNORMAL LOW (ref 3.87–5.11)
RDW: 14.9 % (ref 11.5–15.5)
WBC: 4.6 10*3/uL (ref 4.0–10.5)
nRBC: 0 % (ref 0.0–0.2)

## 2018-09-10 MED ORDER — ACETAMINOPHEN 325 MG PO TABS
ORAL_TABLET | ORAL | Status: AC
  Filled 2018-09-10: qty 2

## 2018-09-10 MED ORDER — SODIUM CHLORIDE 0.9% FLUSH
10.0000 mL | Freq: Once | INTRAVENOUS | Status: AC
  Administered 2018-09-10: 10 mL
  Filled 2018-09-10: qty 10

## 2018-09-10 MED ORDER — SODIUM CHLORIDE 0.9 % IV SOLN
Freq: Once | INTRAVENOUS | Status: AC
  Administered 2018-09-10: 14:00:00 via INTRAVENOUS
  Filled 2018-09-10: qty 250

## 2018-09-10 MED ORDER — TRASTUZUMAB CHEMO 150 MG IV SOLR
6.0000 mg/kg | Freq: Once | INTRAVENOUS | Status: AC
  Administered 2018-09-10: 588 mg via INTRAVENOUS
  Filled 2018-09-10: qty 28

## 2018-09-10 MED ORDER — DIPHENHYDRAMINE HCL 25 MG PO CAPS
ORAL_CAPSULE | ORAL | Status: AC
  Filled 2018-09-10: qty 1

## 2018-09-10 MED ORDER — HEPARIN SOD (PORK) LOCK FLUSH 100 UNIT/ML IV SOLN
500.0000 [IU] | Freq: Once | INTRAVENOUS | Status: AC | PRN
  Administered 2018-09-10: 500 [IU]
  Filled 2018-09-10: qty 5

## 2018-09-10 MED ORDER — SODIUM CHLORIDE 0.9% FLUSH
10.0000 mL | INTRAVENOUS | Status: DC | PRN
  Administered 2018-09-10: 10 mL
  Filled 2018-09-10: qty 10

## 2018-09-10 MED ORDER — ACETAMINOPHEN 325 MG PO TABS
650.0000 mg | ORAL_TABLET | Freq: Once | ORAL | Status: AC
  Administered 2018-09-10: 650 mg via ORAL

## 2018-09-10 MED ORDER — DIPHENHYDRAMINE HCL 25 MG PO CAPS
25.0000 mg | ORAL_CAPSULE | Freq: Once | ORAL | Status: AC
  Administered 2018-09-10: 25 mg via ORAL

## 2018-09-10 NOTE — Telephone Encounter (Signed)
Gave patient avs and calendar.   °

## 2018-09-10 NOTE — Patient Instructions (Signed)
[facility] Discharge Instructions for Patients Receiving Chemotherapy  Today you received the following chemotherapy agents: Trastuzumab (Herceptin)  To help prevent nausea and vomiting after your treatment, we encourage you to take your nausea medication as directed.    If you develop nausea and vomiting that is not controlled by your nausea medication, call the clinic.   BELOW ARE SYMPTOMS THAT SHOULD BE REPORTED IMMEDIATELY:  *FEVER GREATER THAN 100.5 F  *CHILLS WITH OR WITHOUT FEVER  NAUSEA AND VOMITING THAT IS NOT CONTROLLED WITH YOUR NAUSEA MEDICATION  *UNUSUAL SHORTNESS OF BREATH  *UNUSUAL BRUISING OR BLEEDING  TENDERNESS IN MOUTH AND THROAT WITH OR WITHOUT PRESENCE OF ULCERS  *URINARY PROBLEMS  *BOWEL PROBLEMS  UNUSUAL RASH Items with * indicate a potential emergency and should be followed up as soon as possible.  Feel free to call the clinic should you have any questions or concerns. The clinic phone number is [phone].  Please show the CHEMO ALERT CARD at check-in to the Emergency Department and triage nurse.    

## 2018-09-10 NOTE — Progress Notes (Addendum)
[place]  Telephone:([phone]) [phone] Fax:(336) [phone]     ID: [name] [name] DOB: [birth year]  MR#: [id number]  OAC#:[id number]  Patient Care Team: Marda Stalker, PA-C as PCP - General (Family Medicine) Jerline Pain, MD as PCP - Cardiology (Cardiology) Magrinat, Virgie Dad, MD as Consulting Physician (Oncology) Loreta Ave, MD as Referring Physician (Internal Medicine) Donato Heinz, MD as Consulting Physician (Nephrology) Kyung Rudd, MD as Consulting Physician (Radiation Oncology) Jovita Kussmaul, MD as Consulting Physician (General Surgery) Larey Dresser, MD as Consulting Physician (Cardiology) OTHER MD:  CHIEF COMPLAINT: Triple positive breast cancer  CURRENT TREATMENT: Neoadjuvant chemotherapy   HISTORY OF CURRENT ILLNESS: From the original intake note:  [name] [name] palpated a lump on 01/15/2018. She felt soreness and shooting pain under her arm and in the right breast. She underwent bilateral diagnostic mammography with tomography and right breast ultrasonography at [facility] on 03/12/2018 showing: breast density category C. The is architectural distortion at the 11 o'clock position. There is also an oval mass in the right breast anterior depth. Examination of the right axilla showed enlarged lymph nodes. Ultrasonography showed a 4.6 cm mass in the right breast upper outer quadrant posterior depth. An additional 1.2 cm oval mass in the right breast 12 o'clock middle depth. The lymph nodes in the right axillary are highly suggestive of malignancy.   Accordingly on 03/16/2018 she proceeded to biopsy of the right breast area in question. The pathology from this procedure showed ([id number]): At both the 11 and 12 o'clock positions: Invasive ductal carcinoma grade II. Ductal carcinoma in situ, high grade, with necrosis and calcifications. Prognostic indicators significant for: estrogen receptor, 90% positive and progesterone receptor, 40% positive, both with  strong staining intensity. Proliferation marker Ki67 at 80%. HER2 amplified with ratios HER2/CEP17 SIGNALS 6.90 and average HER2 copies per cell 14.50  The patient's subsequent history is as detailed below.  INTERVAL HISTORY: [name] returns today for follow-up and treatment of her triple positive breast cancer. She is doing well today.  She tells me that the neuropathy has improved since her last visit with [place].  She notes that it is still present in her fingertips and toes, but the motor difficulty in her fingertips has improved.  She is due for Trastuzumab today which she tolerates well.    Since her last visit with [place], she underwent right mastectomy on 08/26/18 which showed a residual 2.5cm invasive ductal carcinoma, she had 2/5 lymph nodes positive.  Her margins were negative, and she is healing from surgery well.  She has one drain in place, but is no longer taking any pain medications.  She has not yet seen physical therapy since her surgery.    REVIEW OF SYSTEMS: [name] is doing well today.  She is happy with her pathology results.  She denies any issues today.  Her LMP was in January of this year.  She has had no vaginal spotting or bleeding since then.  She denies any headaches, vision changes, dysphagia, nausea, vomiting, bowel/bladder changes.  She is without any chest pain, palpitations, cough, or shortness of breath.  A detailed ROS was otherwise non contributory today.    PAST MEDICAL HISTORY: Past Medical History:  Diagnosis Date  . Anxiety   . Breast cancer, left breast ([employer]) 2000   Underwent lumpectomy, chemotherapy and radiation  . Facial paralysis/Bells palsy    left side  . Family history of lung cancer   . Family history of lymphoma   . Hypertension   .  Neuropathy    IN FINGERS AND TOES   RECENT INFUSION OF CHEMO  . Personal history of breast cancer 05/06/2018  . Renal disorder    just after TIA acute kidney injury  . TIA (transient ischemic attack) 03/16/2017   [place]  hospital   The patient has a previous history of left breast cancer diagnosed in 2000. She was seen by [facility] in [place], [place] under Dr. Loreta Ave. According to the patient, her left breast cancer was stage II with no lymph node involvement (likely T2N0). She had chemotherapy every 3 weeks for about 6 months. She did not takes any "red drugs." She also did not take anti-estrogens (likely ER/PR negative).   TIA in 2018. She saw a neurologist as a precaution. She denies any clotting issues.   PAST SURGICAL HISTORY: Past Surgical History:  Procedure Laterality Date  . BREAST LUMPECTOMY WITH AXILLARY LYMPH NODE BIOPSY Left 2000   biopsy  . BREAST SURGERY     partial mastectomy with lymph node removal  . MASTECTOMY MODIFIED RADICAL Right 08/26/2018   Procedure: MASTECTOMY MODIFIED RADICAL;  Surgeon: Jovita Kussmaul, MD;  Location: [place];  Service: General;  Laterality: Right;  . MODIFIED MASTECTOMY Right 08/26/2018  . PORTACATH PLACEMENT Left 04/08/2018   Procedure: INSERTION PORT-A-CATH;  Surgeon: Jovita Kussmaul, MD;  Location: [place];  Service: General;  Laterality: Left;  . TUBAL LIGATION Bilateral 2004  . WISDOM TOOTH EXTRACTION      FAMILY HISTORY Family History  Problem Relation Age of Onset  . Hypertension Mother   . Hypertension Father   . Diverticulosis Father   . Diabetes Father   . Lung cancer Father 66       hx smoking  . Hypertension Maternal Grandmother   . CAD Maternal Grandmother   . Alzheimer's disease Maternal Grandmother        died at 24  . Hypertension Paternal Grandmother   . CAD Paternal Grandmother   . Stroke Paternal Grandfather   . Sickle cell trait Paternal Grandfather   . Pneumonia Maternal Aunt        died at a young adult  . Lymphoma Paternal Aunt 48  The patient's father is alive at age 53 and had a history of lung cancer. The patient's mother is alive at age 48. The patient has 1 brother and no sisters. She denies a family history  of breast or ovarian cancer.    GYNECOLOGIC HISTORY:  No LMP recorded. (Menstrual status: Perimenopausal). Menarche: 47 years old Age at first live birth: 47 years old She is GXP3. She is no longer having periods, which were irregular and light. Her LMP was  January 2019 and  December 2018. She is not having hot flashes. The patient used oral contraceptive from [phone] and the Depo-provera shot from [id number] with no complications. She never used HRT.    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 Her husband, [name], is a Administrator. The patient's son, Shea Stakes age 52, works at [employer] in [place]. The patient's daughters, Lilia Pro age 10, and Levonne Spiller age 55, are students. The patient's adopted son, Vonna Kotyk age 110, is also a Ship broker.      ADVANCED DIRECTIVES:    HEALTH MAINTENANCE: Social History   Tobacco Use  . Smoking status: Never Smoker  . Smokeless tobacco: Never Used  Substance Use Topics  . Alcohol use: No  . Drug use: No     Colonoscopy:  PAP: 2015  Bone density:   Allergies  Allergen Reactions  . Ace Inhibitors Swelling    Swelling of lips and face  . Lisinopril Swelling    Swelling of lips, face  . Amlodipine Swelling    Leg swelling  . Adhesive [Tape] Itching and Rash    Please use paper tape  . Latex Itching and Rash    Current Outpatient Medications  Medication Sig Dispense Refill  . BYSTOLIC 20 MG TABS TAKE 1 TABLET (20 MG TOTAL) BY MOUTH DAILY. (Patient taking differently: Take 20 mg by mouth daily. ) 30 tablet 6  . cloNIDine (CATAPRES) 0.2 MG tablet Take 0.2 mg by mouth daily as needed (BP above 150).     . Cyanocobalamin (VITAMIN B12) 500 MCG TABS Take 500 mcg by mouth daily.     Marland Kitchen lidocaine-prilocaine (EMLA) cream Apply 1 application topically as needed.    . Multiple Vitamin (MULITIVITAMIN WITH MINERALS) TABS Take 1 tablet by mouth daily.      Marland Kitchen spironolactone (ALDACTONE) 50 MG tablet TAKE 1 TABLET (50 MG TOTAL) BY  MOUTH DAILY. 30 tablet 6  . diphenoxylate-atropine (LOMOTIL) 2.5-0.025 MG tablet Take 1 tablet by mouth 4 (four) times daily as needed for diarrhea or loose stools. (Patient not taking: Reported on 08/18/2018) 30 tablet 1  . ferrous sulfate 325 (65 FE) MG tablet Take 1 tablet (325 mg total) by mouth daily. 30 tablet 0  . gabapentin (NEURONTIN) 100 MG capsule Take 1-3 capsules (100-300 mg total) by mouth at bedtime. Start taking '100mg'$  at night and increase by '100mg'$  per night to '300mg'$  as tolerated (Patient not taking: Reported on 09/10/2018) 90 capsule 0  . HYDROcodone-acetaminophen (NORCO/VICODIN) 5-325 MG tablet Take 1-2 tablets by mouth every 6 (six) hours as needed for moderate pain. (Patient not taking: Reported on 09/10/2018) 15 tablet 0  . methocarbamol (ROBAXIN) 500 MG tablet Take 1 tablet (500 mg total) by mouth every 6 (six) hours as needed for muscle spasms. (Patient not taking: Reported on 09/10/2018) 20 tablet 1   No current facility-administered medications for this visit.     OBJECTIVE:   Vitals:   09/10/18 1312  BP: (!) 117/99  Pulse: 68  Resp: 18  Temp: 98.6 F (37 C)  SpO2: 100%     Body mass index is 41.85 kg/m.   Wt Readings from Last 3 Encounters:  09/10/18 207 lb 3.2 oz (94 kg)  08/24/18 208 lb 1.6 oz (94.4 kg)  07/30/18 213 lb 4.8 oz (96.8 kg)  ECOG FS:1 - Symptomatic but completely ambulatory GENERAL: Patient is a well appearing female in no acute distress HEENT:  Sclerae anicteric.  Oropharynx clear and moist. No ulcerations or evidence of oropharyngeal candidiasis. Neck is supple.  NODES:  No cervical, supraclavicular, or axillary lymphadenopathy palpated.  BREAST EXAM:  S/p mastectomy, drain in place, healing well LUNGS:  Clear to auscultation bilaterally.  No wheezes or rhonchi. HEART:  Regular rate and rhythm. No murmur appreciated. ABDOMEN:  Soft, nontender.  Positive, normoactive bowel sounds. No organomegaly palpated. MSK:  No focal spinal tenderness to  palpation. Full range of motion bilaterally in the upper extremities. EXTREMITIES:  No peripheral edema.   SKIN:  Clear with no obvious rashes or skin changes. No nail dyscrasia. NEURO:  Nonfocal. Well oriented.  Appropriate affect.     LAB RESULTS:  CMP     Component Value Date/Time   NA 138 08/24/2018 0809   K 3.8 08/24/2018 0809   CL 104 08/24/2018  0809   CO2 22 08/24/2018 0809   GLUCOSE 96 08/24/2018 0809   BUN 25 (H) 08/24/2018 0809   CREATININE 1.51 (H) 08/24/2018 0809   CREATININE 1.29 (H) 03/25/2018 1129   CALCIUM 10.8 (H) 08/24/2018 0809   CALCIUM 10.2 06/25/2018 1425   PROT 6.6 08/20/2018 1233   ALBUMIN 3.5 08/20/2018 1233   AST 17 08/20/2018 1233   AST 22 03/25/2018 1129   ALT 19 08/20/2018 1233   ALT 29 03/25/2018 1129   ALKPHOS 82 08/20/2018 1233   BILITOT 0.4 08/20/2018 1233   BILITOT 0.3 03/25/2018 1129   GFRNONAA 40 (L) 08/24/2018 0809   GFRNONAA 49 (L) 03/25/2018 1129   GFRAA 47 (L) 08/24/2018 0809   GFRAA 57 (L) 03/25/2018 1129    No results found for: TOTALPROTELP, ALBUMINELP, A1GS, A2GS, BETS, BETA2SER, GAMS, MSPIKE, SPEI  No results found for: KPAFRELGTCHN, LAMBDASER, KAPLAMBRATIO  Lab Results  Component Value Date   WBC 4.6 09/10/2018   NEUTROABS 2.7 09/10/2018   HGB 9.7 (L) 09/10/2018   HCT 31.2 (L) 09/10/2018   MCV 96.0 09/10/2018   PLT 168 09/10/2018    '@LASTCHEMISTRY'$ @  No results found for: LABCA2  No components found for: PIRJJO841  No results for input(s): INR in the last 168 hours.  No results found for: LABCA2  No results found for: YSA630  No results found for: ZSW109  No results found for: NAT557  No results found for: CA2729  No components found for: HGQUANT  No results found for: CEA1 / No results found for: CEA1   No results found for: AFPTUMOR  No results found for: [place]  No results found for: PSA1  Appointment on 09/10/2018  Component Date Value Ref Range Status  . WBC 09/10/2018 4.6  4.0 -  10.5 K/uL Final  . RBC 09/10/2018 3.25* 3.87 - 5.11 MIL/uL Final  . Hemoglobin 09/10/2018 9.7* 12.0 - 15.0 g/dL Final  . HCT 09/10/2018 31.2* 36.0 - 46.0 % Final  . MCV 09/10/2018 96.0  80.0 - 100.0 fL Final  . MCH 09/10/2018 29.8  26.0 - 34.0 pg Final  . MCHC 09/10/2018 31.1  30.0 - 36.0 g/dL Final  . RDW 09/10/2018 14.9  11.5 - 15.5 % Final  . Platelets 09/10/2018 168  150 - 400 K/uL Final  . nRBC 09/10/2018 0.0  0.0 - 0.2 % Final  . Neutrophils Relative % 09/10/2018 60  % Final  . Neutro Abs 09/10/2018 2.7  1.7 - 7.7 K/uL Final  . Lymphocytes Relative 09/10/2018 24  % Final  . Lymphs Abs 09/10/2018 1.1  0.7 - 4.0 K/uL Final  . Monocytes Relative 09/10/2018 14  % Final  . Monocytes Absolute 09/10/2018 0.6  0.1 - 1.0 K/uL Final  . Eosinophils Relative 09/10/2018 2  % Final  . Eosinophils Absolute 09/10/2018 0.1  0.0 - 0.5 K/uL Final  . Basophils Relative 09/10/2018 0  % Final  . Basophils Absolute 09/10/2018 0.0  0.0 - 0.1 K/uL Final  . Immature Granulocytes 09/10/2018 0  % Final  . Abs Immature Granulocytes 09/10/2018 0.02  0.00 - 0.07 K/uL Final   Performed at [facility], [place] [address] [place],  [address]    (this displays the last labs from the last 3 days)  No results found for: TOTALPROTELP, ALBUMINELP, A1GS, A2GS, BETS, BETA2SER, GAMS, MSPIKE, SPEI (this displays SPEP labs)  No results found for: KPAFRELGTCHN, LAMBDASER, KAPLAMBRATIO (kappa/lambda light chains)  No results found for: HGBA, HGBA2QUANT, HGBFQUANT, HGBSQUAN (  Hemoglobinopathy evaluation)   No results found for: LDH  No results found for: IRON, TIBC, IRONPCTSAT (Iron and TIBC)  No results found for: FERRITIN  Urinalysis    Component Value Date/Time   COLORURINE STRAW (A) 03/17/2017 0600   APPEARANCEUR CLEAR (A) 03/17/2017 0600   LABSPEC 1.031 (H) 03/17/2017 0600   PHURINE 5.0 03/17/2017 0600   GLUCOSEU NEGATIVE 03/17/2017 0600   HGBUR NEGATIVE 03/17/2017 0600    BILIRUBINUR NEGATIVE 03/17/2017 0600   KETONESUR NEGATIVE 03/17/2017 0600   PROTEINUR NEGATIVE 03/17/2017 0600   UROBILINOGEN 0.2 11/29/2011 1656   NITRITE NEGATIVE 03/17/2017 0600   LEUKOCYTESUR NEGATIVE 03/17/2017 0600     STUDIES: No results found.  ELIGIBLE FOR AVAILABLE RESEARCH PROTOCOL: no  ASSESSMENT: 47 y.o. [place], [place] woman  (1) status post left lumpectomy in 2000 for a (?) T2N0 breast cancer,  (a) status post adjuvant chemotherapy  (b) status post adjuvant radiation  (c) did not receive antiestrogens  (2) genetics testing 05/06/2018, results pending  (3) status post right breast biopsy 03/16/2018 for a clinical mT2-3 N2, anatomic stage III invasive ductal carcinoma, grade 2, triple positive, with an MIB-1 of 80%.  (a) bone scan and chest CT scan negative for metastases except for a thoracic spine lytic lesion of concern  (4) neoadjuvant chemotherapy consisting of carboplatin, docetaxel, trastuzumab and Pertuzumab every 3 weeks x 6 starting 04/16/2018  (a) pertuzumab held beginning with cycle 2 because of diarrhea  (5) trastuzumab to be continued to a total of 1 year  (a) echocardiogram on 06/29/2018 showed an ejection fraction in the 65-70% range  (6) right mastectomy and sentinel lymph node sampling on 08/26/2018 found a residual 2.5cm invasive ductal carcinoma, with 2 of 5 sentinel lymph nodes positive, ypT2, ypN1a; margins were clear  (7) adjuvant radiation pending  (8) antiestrogens to start at the completion of local treatment  PLAN: [name] is doing well today.  She will proceed with Trastuzumab today.  She is tolerating this well.  She met with Dr. Jana Hakim today and reviewed her pathology.  She will proceed with radiation and Trastuzumab.  He will consider TDM1 if the neuropathy resolves.  Her CBC is stable and I reviewed this with her.  Her CMET is pending.  She is recovering from her surgery well.  I referred her to PT for her neuropathy like we had  talked about prior to her surgery.    [name] is due for echo at the end of this month.  I sent Dr. Clayborne Dana nurse Kevan Rosebush a note requesting an appointment at the end of the month.    [name] and Dr. Jana Hakim briefly reviewed starting anastrozole at the end of treatment.  She has not been 1 year without menses.  She is 46.  I have ordered [facility] and estradiol to be drawn at her next appointment so the results can be reviewed at her next appointment with Dr. Jana Hakim.    [name] will return every 3 weeks for labs and Trastuzumab.  She will see Dr. Jana Hakim again in about 6 weeks.  She knows to call for any questions or concerns prior to her next appointment with [place].    Wilber Bihari, NP  09/10/18 1:27 PM Medical Oncology and Hematology [place] [address] [place], [place] [address] Tel. [phone]    Fax. [phone]    ADDENDUM: [name] did well with her surgery.  Unfortunately she did have significant residual disease after neoadjuvant treatment.  Her case was discussed at  the multidisciplinary breast cancer conference 09/09/2018 and the decision was to not proceed with completion axillary lymph node dissection but use radiation instead.  We also considered T-DM 1, which is known to be superior to trastuzumab in these cases.  However she still has significant peripheral neuropathy and I am concerned that the T-DM1 might make that worse.  Accordingly we are continuing with trastuzumab alone at this point.  If the peripheral neuropathy does resolve at some time we will consider switching to T-DM 1.  We discussed possibly starting anastrozole at this point but she would like to wait until radiation is done.  I personally saw this patient and performed a substantive portion of this encounter with the listed APP documented above.   Chauncey Cruel, MD Medical Oncology and Hematology [facility] [address] [place], [place] [address] Tel.  [phone]    Fax. [phone]

## 2018-09-15 ENCOUNTER — Other Ambulatory Visit: Payer: Self-pay

## 2018-09-15 ENCOUNTER — Ambulatory Visit
Admission: RE | Admit: 2018-09-15 | Discharge: 2018-09-15 | Disposition: A | Discharge status: Home / Self Care | Payer: 59 | Source: Ambulatory Visit | Attending: Radiation Oncology | Admitting: Radiation Oncology

## 2018-09-15 ENCOUNTER — Encounter: Payer: Self-pay | Admitting: Radiation Oncology

## 2018-09-15 VITALS — BP 155/29 | HR 72 | Temp 98.5°F | Resp 20 | Ht 59.0 in | Wt 209.4 lb

## 2018-09-15 DIAGNOSIS — Z8673 Personal history of transient ischemic attack (TIA), and cerebral infarction without residual deficits: Secondary | ICD-10-CM | POA: Diagnosis not present

## 2018-09-15 DIAGNOSIS — Z923 Personal history of irradiation: Secondary | ICD-10-CM | POA: Insufficient documentation

## 2018-09-15 DIAGNOSIS — Z170 Estrogen receptor positive status [ER+]: Secondary | ICD-10-CM | POA: Diagnosis not present

## 2018-09-15 DIAGNOSIS — Z888 Allergy status to other drugs, medicaments and biological substances status: Secondary | ICD-10-CM | POA: Diagnosis not present

## 2018-09-15 DIAGNOSIS — C50912 Malignant neoplasm of unspecified site of left female breast: Secondary | ICD-10-CM | POA: Diagnosis not present

## 2018-09-15 DIAGNOSIS — C50411 Malignant neoplasm of upper-outer quadrant of right female breast: Secondary | ICD-10-CM | POA: Diagnosis not present

## 2018-09-15 DIAGNOSIS — Z853 Personal history of malignant neoplasm of breast: Secondary | ICD-10-CM | POA: Insufficient documentation

## 2018-09-15 DIAGNOSIS — Z9011 Acquired absence of right breast and nipple: Secondary | ICD-10-CM | POA: Insufficient documentation

## 2018-09-15 DIAGNOSIS — Z9221 Personal history of antineoplastic chemotherapy: Secondary | ICD-10-CM | POA: Diagnosis not present

## 2018-09-15 DIAGNOSIS — Z79899 Other long term (current) drug therapy: Secondary | ICD-10-CM | POA: Insufficient documentation

## 2018-09-15 DIAGNOSIS — C773 Secondary and unspecified malignant neoplasm of axilla and upper limb lymph nodes: Secondary | ICD-10-CM | POA: Diagnosis not present

## 2018-09-15 NOTE — Progress Notes (Signed)
Radiation Oncology         [phone]) [phone] ________________________________  Name: [name] [name]        MRN: [id number]  Date of Service: 09/15/2018 DOB: Dec 22, 1970  DV:[id number], Loma Sousa, PA-C  Magrinat, Virgie Dad, MD     REFERRING PHYSICIAN: Magrinat, Virgie Dad, MD   DIAGNOSIS: The encounter diagnosis was Malignant neoplasm of upper-outer quadrant of right breast in female, estrogen receptor positive ([place]).   HISTORY OF PRESENT ILLNESS: [name] Lieurance is a 47 y.o. female originally seen in the multidisciplinary breast clinic for a new diagnosis of right breast cancer. The patient was noted to have a prior diagnosis of left breast cancer in 2000. She completed lumpectomy with adjuvant chemotherapy and radiation. She has been followed since, and recently had a palpable area of fullness in the right breast in the upper outer quadrant. She underwent diagnostic imaging which revealed a  4.6 cm mass at 10:30 in the right breast, and a 1.2 cm mass at 12:00. She had greater than 6 lymph nodes that appeared abnormal in the right axilla. She underwent biopsy of all three sites on 03/16/18 revealing a grade 2, invasive ductal carcinoma, triple positive, with a Ki 67 of 80%. She subsequently begin neoadjuvant chemotherapy on 04/16/18 and completed this on 07/30/18. She underwent right modified radical mastectomy with axillary lymph node biopsy.  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  LVSI was present.  2 out of 5 lymph nodes were also involved with cancer.  She is planning to continue Herceptin and possibly considering switching her systemic agent if her neuropathy resolves.  She comes today to discuss the role of adjuvant treatment with radiation.     PREVIOUS RADIATION THERAPY: Yes   2000: external beam radiotherapy to the left breast with Dr. Lucia Gaskins at [facility] in [place].    PAST MEDICAL HISTORY:  Past Medical History:  Diagnosis Date  . Anxiety    . Breast cancer, left breast ([place]) 2000   Underwent lumpectomy, chemotherapy and radiation  . Facial paralysis/Bells palsy    left side  . Family history of lung cancer   . Family history of lymphoma   . Hypertension   . Neuropathy    IN FINGERS AND TOES   RECENT INFUSION OF CHEMO  . Personal history of breast cancer 05/06/2018  . Renal disorder    just after TIA acute kidney injury  . TIA (transient ischemic attack) 03/16/2017   [facility]       PAST SURGICAL HISTORY: Past Surgical History:  Procedure Laterality Date  . BREAST LUMPECTOMY WITH AXILLARY LYMPH NODE BIOPSY Left 2000   biopsy  . BREAST SURGERY     partial mastectomy with lymph node removal  . MASTECTOMY MODIFIED RADICAL Right 08/26/2018   Procedure: MASTECTOMY MODIFIED RADICAL;  Surgeon: Jovita Kussmaul, MD;  Location: [place];  Service: General;  Laterality: Right;  . MODIFIED MASTECTOMY Right 08/26/2018  . PORTACATH PLACEMENT Left 04/08/2018   Procedure: INSERTION PORT-A-CATH;  Surgeon: Jovita Kussmaul, MD;  Location: [place];  Service: General;  Laterality: Left;  . TUBAL LIGATION Bilateral 2004  . WISDOM TOOTH EXTRACTION       FAMILY HISTORY:  Family History  Problem Relation Age of Onset  . Hypertension Mother   . Hypertension Father   . Diverticulosis Father   . Diabetes Father   . Lung cancer Father 57       hx smoking  . Hypertension  Maternal Grandmother   . CAD Maternal Grandmother   . Alzheimer's disease Maternal Grandmother        died at 26  . Hypertension Paternal Grandmother   . CAD Paternal Grandmother   . Stroke Paternal Grandfather   . Sickle cell trait Paternal Grandfather   . Pneumonia Maternal Aunt        died at a young adult  . Lymphoma Paternal Aunt 74     SOCIAL HISTORY:  reports that she has never smoked. She has never used smokeless tobacco. She reports that she does not drink alcohol or use drugs. The patient is married and lives in [place]. She works for a [employer].   ALLERGIES: Ace inhibitors; Lisinopril; Amlodipine; Adhesive [tape]; and Latex   MEDICATIONS:  Current Outpatient Medications  Medication Sig Dispense Refill  . BYSTOLIC 20 MG TABS TAKE 1 TABLET (20 MG TOTAL) BY MOUTH DAILY. (Patient taking differently: Take 20 mg by mouth daily. ) 30 tablet 6  . cloNIDine (CATAPRES) 0.2 MG tablet Take 0.2 mg by mouth daily as needed (BP above 150).     . Cyanocobalamin (VITAMIN B12) 500 MCG TABS Take 500 mcg by mouth daily.     . diphenoxylate-atropine (LOMOTIL) 2.5-0.025 MG tablet Take 1 tablet by mouth 4 (four) times daily as needed for diarrhea or loose stools. (Patient not taking: Reported on 08/18/2018) 30 tablet 1  . ferrous sulfate 325 (65 FE) MG tablet Take 1 tablet (325 mg total) by mouth daily. 30 tablet 0  . gabapentin (NEURONTIN) 100 MG capsule Take 1-3 capsules (100-300 mg total) by mouth at bedtime. Start taking 100mg  at night and increase by 100mg  per night to 300mg  as tolerated (Patient not taking: Reported on 09/10/2018) 90 capsule 0  . HYDROcodone-acetaminophen (NORCO/VICODIN) 5-325 MG tablet Take 1-2 tablets by mouth every 6 (six) hours as needed for moderate pain. (Patient not taking: Reported on 09/10/2018) 15 tablet 0  . lidocaine-prilocaine (EMLA) cream Apply 1 application topically as needed.    . methocarbamol (ROBAXIN) 500 MG tablet Take 1 tablet (500 mg total) by mouth every 6 (six) hours as needed for muscle spasms. (Patient not taking: Reported on 09/10/2018) 20 tablet 1  . Multiple Vitamin (MULITIVITAMIN WITH MINERALS) TABS Take 1 tablet by mouth daily.      Marland Kitchen spironolactone (ALDACTONE) 50 MG tablet TAKE 1 TABLET (50 MG TOTAL) BY MOUTH DAILY. 30 tablet 6   No current facility-administered medications for this visit.      REVIEW OF SYSTEMS: On review of systems, the patient reports that she is doing well overall. She tolerated chemotherapy well and has healed nicely from surgery. She denies any chest  pain, shortness of breath, cough, fevers, chills, night sweats, unintended weight changes. She denies any bowel or bladder disturbances, and denies abdominal pain, nausea or vomiting. She denies any new musculoskeletal or joint aches or pains. A complete review of systems is obtained and is otherwise negative.     PHYSICAL EXAM:  Wt Readings from Last 3 Encounters:  09/10/18 207 lb 3.2 oz (94 kg)  08/24/18 208 lb 1.6 oz (94.4 kg)  07/30/18 213 lb 4.8 oz (96.8 kg)   Temp Readings from Last 3 Encounters:  09/10/18 98.6 F (37 C) (Oral)  08/27/18 97.9 F (36.6 C) (Oral)  08/24/18 98.4 F (36.9 C)   BP Readings from Last 3 Encounters:  09/10/18 (!) 117/99  08/27/18 (!) 149/84  08/24/18 (!) 149/86   Pulse Readings from  Last 3 Encounters:  09/10/18 68  08/27/18 69  08/24/18 77     In general this is a well appearing African American female in no acute distress. She is alert and oriented x4 and appropriate throughout the examination. HEENT reveals that the patient is normocephalic, atraumatic. EOMs are intact.Cardiopulmonary assessment is negative for acute distress and she exhibits normal effort.    ECOG = 1  0 - Asymptomatic (Fully active, able to carry on all predisease activities without restriction)  1 - Symptomatic but completely ambulatory (Restricted in physically strenuous activity but ambulatory and able to carry out work of a light or sedentary nature. For example, light housework, office work)  2 - Symptomatic, <50% in bed during the day (Ambulatory and capable of all self care but unable to carry out any work activities. Up and about more than 50% of waking hours)  3 - Symptomatic, >50% in bed, but not bedbound (Capable of only limited self-care, confined to bed or chair 50% or more of waking hours)  4 - Bedbound (Completely disabled. Cannot carry on any self-care. Totally confined to bed or chair)  5 - Death   Eustace Pen MM, Creech RH, Tormey DC, et al. [id number]).  "Toxicity and response criteria of the [facility]". [place] Oncol. 5 (6): 649-55    LABORATORY DATA:  Lab Results  Component Value Date   WBC 4.6 09/10/2018   HGB 9.7 (L) 09/10/2018   HCT 31.2 (L) 09/10/2018   MCV 96.0 09/10/2018   PLT 168 09/10/2018   Lab Results  Component Value Date   NA 142 09/10/2018   K 4.2 09/10/2018   CL 106 09/10/2018   CO2 26 09/10/2018   Lab Results  Component Value Date   ALT 16 09/10/2018   AST 16 09/10/2018   ALKPHOS 91 09/10/2018   BILITOT 0.2 (L) 09/10/2018      RADIOGRAPHY: No results found.     IMPRESSION/PLAN: 1. Stage IIA, cT2N2M0 grade 2, Triple positive invasive ductal carcinoma of the right breast. Dr. Durenda Hurt her pathology findings as well as her course and recomends proceeding with adjuvant external radiotherapy to the breast followed by antiestrogen therapy. We discussed the risks, benefits, short, and long term effects of radiotherapy, and the patient is interested in proceeding. Dr. Lisbeth Renshaw discusses the delivery and logistics of radiotherapy and he recommends a course of 6 1/2 weeks of radiotherapy to the chest and regional nodes. Written consent is obtained and placed in the chart, a copy was provided to the patient. She will simulate next week.   In a visit lasting 20 minutes, greater than 50% of the time was spent face to face discussing the results of her recent surgery and our upcoming radiation plan, and coordinating the patient's care.  The above documentation reflects my direct findings during this shared patient visit. Please see the separate note by Dr. Lisbeth Renshaw on this date for the remainder of the patient's plan of care.  ------------------------------------------------  Jodelle Gross, MD, PhD

## 2018-09-22 ENCOUNTER — Ambulatory Visit: Payer: 59 | Admitting: Radiation Oncology

## 2018-09-23 ENCOUNTER — Ambulatory Visit
Admission: RE | Admit: 2018-09-23 | Discharge: 2018-09-23 | Disposition: A | Discharge status: Home / Self Care | Payer: 59 | Source: Ambulatory Visit | Attending: Radiation Oncology | Admitting: Radiation Oncology

## 2018-09-23 DIAGNOSIS — C50411 Malignant neoplasm of upper-outer quadrant of right female breast: Secondary | ICD-10-CM | POA: Insufficient documentation

## 2018-09-23 DIAGNOSIS — Z170 Estrogen receptor positive status [ER+]: Secondary | ICD-10-CM | POA: Diagnosis not present

## 2018-09-23 DIAGNOSIS — Z510 Encounter for antineoplastic radiation therapy: Secondary | ICD-10-CM | POA: Insufficient documentation

## 2018-09-24 ENCOUNTER — Other Ambulatory Visit: Payer: Self-pay

## 2018-09-24 ENCOUNTER — Encounter: Payer: Self-pay | Admitting: Physical Therapy

## 2018-09-24 ENCOUNTER — Other Ambulatory Visit: Payer: Self-pay | Admitting: *Deleted

## 2018-09-24 ENCOUNTER — Ambulatory Visit: Payer: 59 | Attending: Adult Health | Admitting: Physical Therapy

## 2018-09-24 DIAGNOSIS — R208 Other disturbances of skin sensation: Secondary | ICD-10-CM | POA: Diagnosis present

## 2018-09-24 DIAGNOSIS — M25611 Stiffness of right shoulder, not elsewhere classified: Secondary | ICD-10-CM

## 2018-09-24 DIAGNOSIS — C50411 Malignant neoplasm of upper-outer quadrant of right female breast: Secondary | ICD-10-CM

## 2018-09-24 DIAGNOSIS — Z170 Estrogen receptor positive status [ER+]: Secondary | ICD-10-CM

## 2018-09-24 DIAGNOSIS — R600 Localized edema: Secondary | ICD-10-CM | POA: Diagnosis not present

## 2018-09-24 DIAGNOSIS — M6281 Muscle weakness (generalized): Secondary | ICD-10-CM | POA: Diagnosis not present

## 2018-09-24 MED ORDER — GABAPENTIN 100 MG PO CAPS: 100.0000 mg | ORAL_CAPSULE | Freq: Every day | ORAL | 0 refills | Status: DC

## 2018-09-24 NOTE — Therapy (Signed)
[place], [place], [address] Phone: [phone]   Fax:  [phone]  Physical Therapy Evaluation  Patient Details  Name: [name] [name] MRN: [id number] Date of Birth: [birth year] Referring Provider (PT): Causey   Encounter Date: 09/24/2018  PT End of Session - 09/24/18 1107    Visit Number  1    Number of Visits  9    Date for PT Re-Evaluation  10/22/18    PT Start Time  1018    PT Stop Time  1100    PT Time Calculation (min)  42 min    Activity Tolerance  Patient tolerated treatment well    Behavior During Therapy  [facility] for tasks assessed/performed       Past Medical History:  Diagnosis Date  . Anxiety   . Breast cancer, left breast ([place]) 2000   Underwent lumpectomy, chemotherapy and radiation  . Facial paralysis/Bells palsy    left side  . Family history of lung cancer   . Family history of lymphoma   . Hypertension   . Neuropathy    IN FINGERS AND TOES   RECENT INFUSION OF CHEMO  . Personal history of breast cancer 05/06/2018  . Renal disorder    just after TIA acute kidney injury  . TIA (transient ischemic attack) 03/16/2017   [facility]    Past Surgical History:  Procedure Laterality Date  . BREAST LUMPECTOMY WITH AXILLARY LYMPH NODE BIOPSY Left 2000   biopsy  . BREAST SURGERY     partial mastectomy with lymph node removal  . MASTECTOMY MODIFIED RADICAL Right 08/26/2018   Procedure: MASTECTOMY MODIFIED RADICAL;  Surgeon: Jovita Kussmaul, MD;  Location: [place];  Service: General;  Laterality: Right;  . MODIFIED MASTECTOMY Right 08/26/2018  . PORTACATH PLACEMENT Left 04/08/2018   Procedure: INSERTION PORT-A-CATH;  Surgeon: Jovita Kussmaul, MD;  Location: [place];  Service: General;  Laterality: Left;  . TUBAL LIGATION Bilateral 2004  . WISDOM TOOTH EXTRACTION      There were no vitals filed for this visit.   Subjective Assessment - 09/24/18 1024    Subjective  I had a right  mastectomy on 08/26/18. I have done chemotherapy and I will be starting radiation next Tuesday. I have tingling in my fingers and toes and I think caffiene makes it worse. My upper arm feels tight.     Pertinent History  2000 L partial mastectomy with axillary node dissection, completed chemo and radiation, 08/26/18- Ductal carcinoma in situ, high grade, with necrosis and calcifications. Prognostic indicators significant for: estrogen receptor, 90% positive and progesterone receptor, 40% positive, both with strong staining intensity. Proliferation marker Ki67 at 80%. HER2 amplified with ratios HER2/CEP17 SIGNALS 6.90 and average HER2 copies per cell 14.50, R mastectomy with 6 nodes removed, completed chemo and will begin radiation next week,, kadcyla    Patient Stated Goals  to be able to have full ROM of R shoulder    Currently in Pain?  No/denies    Pain Score  0-No pain         [facility] PT Assessment - 09/24/18 0001      Assessment   Medical Diagnosis  right breast cancer   2000 had left breast cancer   Referring Provider (PT)  Causey    Onset Date/Surgical Date  08/26/18    Hand Dominance  Right    Prior Therapy  none      Precautions   Precautions  Other (comment)  at risk for lymphedema     Restrictions   Weight Bearing Restrictions  No      Balance Screen   Has the patient fallen in the past 6 months  Yes    How many times?  1   while taking chemotherapy, dehydrated fell down steps   Has the patient had a decrease in activity level because of a fear of falling?   No    Is the patient reluctant to leave their home because of a fear of falling?   No      Home Social worker  Private residence    Living Arrangements  Spouse/significant other;Children   1 daughter, 1 son, 1 dog   Available Help at Discharge  Family    Type of [place]  Full time employment   on medical leave   [employer]- desk job    Leisure  pt reports she exercises 2x/wk, walk away the pounds      Cognition   Overall Cognitive Status  Within Functional Limits for tasks assessed      Observation/Other Assessments   Observations  swelling at right lateral trunk. glue still intact over scar      ROM / Strength   AROM / PROM / Strength  AROM      AROM   AROM Assessment Site  Shoulder    Right/Left Shoulder  Right;Left    Right Shoulder Flexion  136 Degrees    Right Shoulder ABduction  119 Degrees    Right Shoulder Internal Rotation  56 Degrees    Right Shoulder External Rotation  88 Degrees    Left Shoulder Flexion  163 Degrees    Left Shoulder ABduction  179 Degrees    Left Shoulder Internal Rotation  51 Degrees    Left Shoulder External Rotation  79 Degrees        LYMPHEDEMA/ONCOLOGY QUESTIONNAIRE - 09/24/18 1045      Type   Cancer Type  right breast cancer      Surgeries   Mastectomy Date  08/26/18    Lumpectomy Date  09/02/99    Axillary Lymph Node Dissection Date  08/26/18   5 nodes removed, 2 positive   Number Lymph Nodes Removed  5      Treatment   Active Chemotherapy Treatment  No    Past Chemotherapy Treatment  Yes    Date  07/30/18   03/02/2000   Active Radiation Treatment  No   pt to begin next week   Past Radiation Treatment  Yes    Current Hormone Treatment  No   will begin after radiation   Past Hormone Therapy  No      What other symptoms do you have   Are you Having Heaviness or Tightness  Yes    Are you having Pain  Yes    Are you having pitting edema  No    Is it Hard or Difficult finding clothes that fit  No    Do you have infections  No    Is there Decreased scar mobility  Yes      Lymphedema Assessments   Lymphedema Assessments  Upper extremities      Right Upper Extremity Lymphedema   15 cm Proximal to Olecranon Process  40.3 cm    Olecranon  Process  30.9 cm    15 cm Proximal to Ulnar  Styloid Process  31 cm    Just Proximal to Ulnar Styloid Process  20.6 cm    Across Hand at [employer]  20.8 cm    At [place] of 2nd Digit  6.6 cm      Left Upper Extremity Lymphedema   15 cm Proximal to Olecranon Process  41 cm    Olecranon Process  32.6 cm    15 cm Proximal to Ulnar Styloid Process  32 cm    Just Proximal to Ulnar Styloid Process  20.5 cm    Across Hand at [employer]  20.5 cm    At [place] of 2nd Digit  6.9 cm          Quick Dash - 09/24/18 0001    Open a tight or new jar  Mild difficulty    Do heavy household chores (wash walls, wash floors)  No difficulty    Carry a shopping bag or briefcase  No difficulty    Wash your back  Mild difficulty    Use a knife to cut food  No difficulty    Recreational activities in which you take some force or impact through your arm, shoulder, or hand (golf, hammering, tennis)  Moderate difficulty    During the past week, to what extent has your arm, shoulder or hand problem interfered with your normal social activities with family, friends, neighbors, or groups?  Slightly    During the past week, to what extent has your arm, shoulder or hand problem limited your work or other regular daily activities  Slightly    Arm, shoulder, or hand pain.  Mild    Tingling (pins and needles) in your arm, shoulder, or hand  Moderate    Difficulty Sleeping  No difficulty    DASH Score  20.45 %        Objective measurements completed on examination: See above findings.      [place] Adult PT Treatment/Exercise - 09/24/18 0001      Exercises   Exercises  Shoulder   had pt demonstrate 1 rep each of post op breast exercises            PT Education - 09/24/18 1107    Education Details  anatomy and physiology of lymphatic system, lymphedema risk reduction practices, ABC class, post op breast exercises    Person(s) Educated  Patient    Methods  Explanation;Handout    Comprehension  Verbalized understanding          PT Long  Term Goals - 09/24/18 1118      PT LONG TERM GOAL #1   Title  Pt will demonstrate 170 degrees of shoulder abduction to allow pt to reach out to sides     Baseline  119    Time  4    Period  Weeks    Status  New    Target Date  10/22/18      PT LONG TERM GOAL #2   Title  Pt will demonstrate 170 degrees of shoulder flexion to allow pt to reach overhead    Baseline  136    Time  4    Period  Weeks    Status  New    Target Date  10/22/18      PT LONG TERM GOAL #3   Title  Pt will report a 50% improvement in numbness and tingling in fingers and  toes to allow improved comfort    Time  4    Period  Weeks    Status  New    Target Date  10/22/18      PT LONG TERM GOAL #4   Title  Pt will be independent in a home exercise program for continued strengthening and stretching    Time  4    Period  Weeks    Status  New    Target Date  10/22/18      PT LONG TERM GOAL #5   Title  Pt will be able to independently verbalize lymphedema risk reduction practices    Time  4    Period  Weeks    Status  New    Target Date  10/22/18             Plan - 09/24/18 1108    Clinical Impression Statement  Pt presents to PT with decreased right shoulder ROM, tightness, pain and neuropathy in fingers and toes following treatment for right breast cancer. Pt has completed chemotherapy, underwent a R mastectomy with lymph node dissection (2/5 nodes positive). Pt will begin radiation next week. She has a previous history of left breast cancer. She would benefit from skilled PT services to increased R shoulder ROM, decrease pain and tightness across right chest and axilla, decrease R lateral trunk swelling, decrease symptoms of peripheral neuropathy and instruct pt in a home exercise program    History and Personal Factors relevant to plan of care:  previous hx of left breat cancer and radiation, pt is right handed    Clinical Presentation  Evolving    Clinical Presentation due to:  pt to begin radiation     Clinical Decision Making  Moderate    Rehab Potential  Good    Clinical Impairments Affecting Rehab Potential  previous hx of left breast cancer with radiation, pt beginning radiation next week for R breast cancer    PT Frequency  2x / week    PT Duration  4 weeks    PT Treatment/Interventions  ADLs/Self Care Home Management;Therapeutic exercise;Therapeutic activities;Patient/family education;Scar mobilization;Manual lymph drainage;Manual techniques;Passive range of motion;Compression bandaging;Vasopneumatic Device;Taping    PT Next Visit Plan  begin pulleys, ball, PROM to R shoulder, give AAROM cane exercises, MLD R lateral trunk if needed    PT Home Exercise Plan  post op breast exercises    Consulted and Agree with Plan of Care  Patient       Patient will benefit from skilled therapeutic intervention in order to improve the following deficits and impairments:  Pain, Increased fascial restricitons, Decreased scar mobility, Postural dysfunction, Decreased range of motion, Decreased strength, Impaired UE functional use, Decreased knowledge of precautions, Increased edema  Visit Diagnosis: Stiffness of right shoulder, not elsewhere classified - Plan: PT plan of care cert/re-cert  Muscle weakness (generalized) - Plan: PT plan of care cert/re-cert  Localized edema - Plan: PT plan of care cert/re-cert  Other disturbances of skin sensation - Plan: PT plan of care cert/re-cert     Problem List Patient Active Problem List   Diagnosis Date Noted  . Cancer of right female breast ([place]) 08/26/2018  . Genetic testing 08/06/2018  . Morbid obesity with BMI of 40.0-44.9, adult ([place]) 07/09/2018  . Port-A-Cath in place 05/07/2018  . Personal history of breast cancer 05/06/2018  . Family history of lymphoma   . Family history of lung cancer   . Essential hypertension 04/01/2018  . Pre-operative cardiovascular examination  04/01/2018  . Malignant neoplasm of upper-outer quadrant of right breast  in female, estrogen receptor positive ([place]) 03/19/2018  . History of TIA (transient ischemic attack) 03/17/2017  . Fibroids 08/02/2014    [facility] 09/24/2018, 11:23 AM  [place], [place], [address] Phone: [phone]   Fax:  [phone]  Name: [name] [name] MRN: [id number] Date of Birth: January 21, 1971  Manus Gunning, PT 09/24/18 11:24 AM

## 2018-09-28 NOTE — Progress Notes (Signed)
  Radiation Oncology         [phone]) [phone] ________________________________  Name: [name] [name] MRN: [id number]  Date: 09/23/2018  DOB: Jun 06, 1971  DIAGNOSIS:     ICD-10-CM   1. Malignant neoplasm of upper-outer quadrant of right breast in female, estrogen receptor positive ([place]) C50.411    Z17.0      SIMULATION AND TREATMENT PLANNING NOTE  The patient presented for simulation prior to beginning her course of radiation treatment for her diagnosis of right-sided breast cancer. The patient was placed in a supine position on a breast board. A customized vac-lock bag was also constructed and this complex treatment device will be used on a daily basis during her treatment. In this fashion, a CT scan was obtained through the chest area and an isocenter was placed near the chest wall at the upper aspect of the right chest.  The patient will be planned to receive a course of radiation initially to a dose of 50.4 gray. This will consist of a 4 field technique targeting the right chest wall as well as the supraclavicular region. Therefore 2 customized medial and lateral tangent fields have been created targeting the chest wall, and also 2 additional customized fields have been designed to treat the supraclavicular region both with a right supraclavicular field and a right posterior axillary boost field. A forward planning/reduced field technique will also be evaluated to determine if this significantly improves the dose homogeneity of the overall plan. Therefore, additional customized blocks/fields may be necessary.  This initial treatment will be accomplished at 1.8 gray per fraction.   The initial plan will consist of a 3-D conformal technique. The target volume/scar, heart and lungs have been contoured and dose volume histograms of each of these structures will be evaluated as part of the 3-D conformal treatment planning process.   It is anticipated that the patient will then receive a 10 gray boost to  the surgical scar. This will be accomplished at 2 gray per fraction. The final anticipated total dose therefore will correspond to 60.4 gray.    _______________________________   Jodelle Gross, MD, PhD

## 2018-09-28 NOTE — Progress Notes (Signed)
  Radiation Oncology         [phone]) [phone] ________________________________  Name: [name] [name] MRN: [id number]  Date: 09/23/2018  DOB: May 03, 1971  Optical Surface Tracking Plan:  Since intensity modulated radiotherapy (IMRT) and 3D conformal radiation treatment methods are predicated on accurate and precise positioning for treatment, intrafraction motion monitoring is medically necessary to ensure accurate and safe treatment delivery.  The ability to quantify intrafraction motion without excessive ionizing radiation dose can only be performed with optical surface tracking. Accordingly, surface imaging offers the opportunity to obtain 3D measurements of patient position throughout IMRT and 3D treatments without excessive radiation exposure.  I am ordering optical surface tracking for this patient's upcoming course of radiotherapy. ________________________________  Kyung Rudd, MD 09/28/2018 6:53 AM    Reference:   Particia Jasper, et al. Surface imaging-based analysis of intrafraction motion for breast radiotherapy patients.Journal of [place], n. 6, nov. 2014. ISSN [id number].   Available at: <http://www.jacmp.org/index.php/jacmp/article/view/4957>.

## 2018-09-29 ENCOUNTER — Ambulatory Visit: Payer: 59 | Admitting: Physical Therapy

## 2018-09-29 ENCOUNTER — Ambulatory Visit
Admission: RE | Admit: 2018-09-29 | Discharge: 2018-09-29 | Disposition: A | Discharge status: Home / Self Care | Payer: 59 | Source: Ambulatory Visit | Attending: Radiation Oncology | Admitting: Radiation Oncology

## 2018-09-29 ENCOUNTER — Other Ambulatory Visit: Payer: Self-pay

## 2018-09-29 ENCOUNTER — Encounter: Payer: Self-pay | Admitting: Physical Therapy

## 2018-09-29 ENCOUNTER — Other Ambulatory Visit: Payer: Self-pay | Admitting: Oncology

## 2018-09-29 DIAGNOSIS — M25611 Stiffness of right shoulder, not elsewhere classified: Secondary | ICD-10-CM

## 2018-09-29 DIAGNOSIS — Z510 Encounter for antineoplastic radiation therapy: Secondary | ICD-10-CM | POA: Diagnosis not present

## 2018-09-29 DIAGNOSIS — M6281 Muscle weakness (generalized): Secondary | ICD-10-CM

## 2018-09-29 DIAGNOSIS — C50411 Malignant neoplasm of upper-outer quadrant of right female breast: Secondary | ICD-10-CM | POA: Diagnosis not present

## 2018-09-29 NOTE — Therapy (Signed)
[place], [place], [address] Phone: [phone]   Fax:  [phone]  Physical Therapy Treatment  Patient Details  Name: [name] [name] MRN: [id number] Date of Birth: 1971-05-06 Referring Provider (PT): Causey   Encounter Date: 09/29/2018  PT End of Session - 09/29/18 1208    Visit Number  2    Number of Visits  9    Date for PT Re-Evaluation  10/22/18    PT Start Time  0933    PT Stop Time  1016    PT Time Calculation (min)  43 min    Activity Tolerance  Patient tolerated treatment well    Behavior During Therapy  [facility] for tasks assessed/performed       Past Medical History:  Diagnosis Date  . Anxiety   . Breast cancer, left breast ([place]) 2000   Underwent lumpectomy, chemotherapy and radiation  . Facial paralysis/Bells palsy    left side  . Family history of lung cancer   . Family history of lymphoma   . Hypertension   . Neuropathy    IN FINGERS AND TOES   RECENT INFUSION OF CHEMO  . Personal history of breast cancer 05/06/2018  . Renal disorder    just after TIA acute kidney injury  . TIA (transient ischemic attack) 03/16/2017   [facility] [place]    Past Surgical History:  Procedure Laterality Date  . BREAST LUMPECTOMY WITH AXILLARY LYMPH NODE BIOPSY Left 2000   biopsy  . BREAST SURGERY     partial mastectomy with lymph node removal  . MASTECTOMY MODIFIED RADICAL Right 08/26/2018   Procedure: MASTECTOMY MODIFIED RADICAL;  Surgeon: Jovita Kussmaul, MD;  Location: [place];  Service: General;  Laterality: Right;  . MODIFIED MASTECTOMY Right 08/26/2018  . PORTACATH PLACEMENT Left 04/08/2018   Procedure: INSERTION PORT-A-CATH;  Surgeon: Jovita Kussmaul, MD;  Location: [place] [facility];  Service: General;  Laterality: Left;  . TUBAL LIGATION Bilateral 2004  . WISDOM TOOTH EXTRACTION      There were no vitals filed for this visit.  Subjective Assessment - 09/29/18 0938    Subjective  The exercises have  been going well. I have some soreness on my right side near the outside of my shoulder.     Pertinent History  2000 L partial mastectomy with axillary node dissection, completed chemo and radiation, 08/26/18- Ductal carcinoma in situ, high grade, with necrosis and calcifications. Prognostic indicators significant for: estrogen receptor, 90% positive and progesterone receptor, 40% positive, both with strong staining intensity. Proliferation marker Ki67 at 80%. HER2 amplified with ratios HER2/CEP17 SIGNALS 6.90 and average HER2 copies per cell 14.50, R mastectomy with 6 nodes removed, completed chemo and will begin radiation next week,, kadcyla    Patient Stated Goals  to be able to have full ROM of R shoulder    Currently in Pain?  No/denies    Pain Score  0-No pain         [facility] PT Assessment - 09/29/18 0001      AROM   Right Shoulder Flexion  171 Degrees    Right Shoulder ABduction  170 Degrees                   [facility] Adult PT Treatment/Exercise - 09/29/18 0001      Exercises   Exercises  Shoulder      Shoulder Exercises: Supine   Horizontal ABduction  Strengthening;Both;10 reps   pt returned therapist demo  Theraband Level (Shoulder Horizontal ABduction)  Level 2 (Red)    External Rotation  Strengthening;Both;10 reps   pt returned therapist demo   Flexion  Strengthening;Both;10 reps   narrow and wide grip, pt returned therapist demo   Diagonals  Strengthening;Both;10 reps   pt returned therapist demo     Shoulder Exercises: Pulleys   Flexion  2 minutes   pt returned therapist demo   ABduction  2 minutes   pt returned therapist demo     Shoulder Exercises: Therapy Ball   Flexion  Both;10 reps   with stretch at top, pt returned therapist demo   ABduction  10 reps;Both   with stretch at top, pt returned therapist demo     Manual Therapy   Manual Therapy  Passive ROM;Myofascial release    Myofascial Release  to R axilla using cross hands technique    Passive  ROM  to R shoulder in direction of flexion, abduction, D2                  PT Long Term Goals - 09/24/18 1118      PT LONG TERM GOAL #1   Title  Pt will demonstrate 170 degrees of shoulder abduction to allow pt to reach out to sides     Baseline  119    Time  4    Period  Weeks    Status  New    Target Date  10/22/18      PT LONG TERM GOAL #2   Title  Pt will demonstrate 170 degrees of shoulder flexion to allow pt to reach overhead    Baseline  136    Time  4    Period  Weeks    Status  New    Target Date  10/22/18      PT LONG TERM GOAL #3   Title  Pt will report a 50% improvement in numbness and tingling in fingers and toes to allow improved comfort    Time  4    Period  Weeks    Status  New    Target Date  10/22/18      PT LONG TERM GOAL #4   Title  Pt will be independent in a home exercise program for continued strengthening and stretching    Time  4    Period  Weeks    Status  New    Target Date  10/22/18      PT LONG TERM GOAL #5   Title  Pt will be able to independently verbalize lymphedema risk reduction practices    Time  4    Period  Weeks    Status  New    Target Date  10/22/18            Plan - 09/29/18 1208    Clinical Impression Statement  Began AAROM exercises today. Pt required verbal and tactile cues to perform exercises correctly. She demonstrates tremendous improvements in ROM today and has made excellent progress towards her goals. She is compliant with her home exercise program. Instructed pt in supine scapular strengthening exercises today to do at home.     Rehab Potential  Good    Clinical Impairments Affecting Rehab Potential  previous hx of left breast cancer with radiation, pt beginning radiation next week for R breast cancer    PT Frequency  2x / week    PT Duration  4 weeks    PT Treatment/Interventions  ADLs/Self Care  Home Management;Therapeutic exercise;Therapeutic activities;Patient/family education;Scar  mobilization;Manual lymph drainage;Manual techniques;Passive range of motion;Compression bandaging;Vasopneumatic Device;Taping    PT Next Visit Plan  continue pulleys, ball, PROM to R shoulder, give AAROM cane exercises, MLD R lateral trunk if needed, give STrength ABC program eventually, can decrease to 1 every other week while pt goes through radiation once she meets initial goals    Consulted and Agree with Plan of Care  Patient       Patient will benefit from skilled therapeutic intervention in order to improve the following deficits and impairments:  Pain, Increased fascial restricitons, Decreased scar mobility, Postural dysfunction, Decreased range of motion, Decreased strength, Impaired UE functional use, Decreased knowledge of precautions, Increased edema  Visit Diagnosis: Stiffness of right shoulder, not elsewhere classified  Muscle weakness (generalized)     Problem List Patient Active Problem List   Diagnosis Date Noted  . Cancer of right female breast ([place]) 08/26/2018  . Genetic testing 08/06/2018  . Morbid obesity with BMI of 40.0-44.9, adult ([place]) 07/09/2018  . Port-A-Cath in place 05/07/2018  . Personal history of breast cancer 05/06/2018  . Family history of lymphoma   . Family history of lung cancer   . Essential hypertension 04/01/2018  . Pre-operative cardiovascular examination 04/01/2018  . Malignant neoplasm of upper-outer quadrant of right breast in female, estrogen receptor positive ([place]) 03/19/2018  . History of TIA (transient ischemic attack) 03/17/2017  . Fibroids 08/02/2014    Allyson Sabal [facility] 09/29/2018, 12:12 PM  [place], [place], [address] Phone: [phone]   Fax:  [phone]  Name: [name] [name] MRN: [id number] Date of Birth: 04/04/1971  Manus Gunning, PT 09/29/18 12:12 PM

## 2018-09-29 NOTE — Patient Instructions (Signed)

## 2018-09-30 ENCOUNTER — Ambulatory Visit
Admission: RE | Admit: 2018-09-30 | Discharge: 2018-09-30 | Disposition: A | Discharge status: Home / Self Care | Payer: 59 | Source: Ambulatory Visit | Attending: Radiation Oncology | Admitting: Radiation Oncology

## 2018-09-30 DIAGNOSIS — Z510 Encounter for antineoplastic radiation therapy: Secondary | ICD-10-CM | POA: Diagnosis not present

## 2018-09-30 DIAGNOSIS — Z5112 Encounter for antineoplastic immunotherapy: Secondary | ICD-10-CM | POA: Diagnosis not present

## 2018-09-30 DIAGNOSIS — C50411 Malignant neoplasm of upper-outer quadrant of right female breast: Secondary | ICD-10-CM | POA: Diagnosis not present

## 2018-10-01 ENCOUNTER — Inpatient Hospital Stay: Payer: 59

## 2018-10-01 ENCOUNTER — Ambulatory Visit
Admission: RE | Admit: 2018-10-01 | Discharge: 2018-10-01 | Disposition: A | Discharge status: Home / Self Care | Payer: 59 | Source: Ambulatory Visit | Attending: Radiation Oncology | Admitting: Radiation Oncology

## 2018-10-01 VITALS — BP 123/72 | HR 67 | Temp 98.6°F | Resp 24 | Wt 206.2 lb

## 2018-10-01 DIAGNOSIS — Z170 Estrogen receptor positive status [ER+]: Secondary | ICD-10-CM

## 2018-10-01 DIAGNOSIS — C50411 Malignant neoplasm of upper-outer quadrant of right female breast: Secondary | ICD-10-CM | POA: Diagnosis not present

## 2018-10-01 DIAGNOSIS — Z5112 Encounter for antineoplastic immunotherapy: Secondary | ICD-10-CM | POA: Diagnosis not present

## 2018-10-01 DIAGNOSIS — Z510 Encounter for antineoplastic radiation therapy: Secondary | ICD-10-CM | POA: Diagnosis not present

## 2018-10-01 DIAGNOSIS — Z9012 Acquired absence of left breast and nipple: Secondary | ICD-10-CM | POA: Diagnosis not present

## 2018-10-01 DIAGNOSIS — Z95828 Presence of other vascular implants and grafts: Secondary | ICD-10-CM

## 2018-10-01 LAB — CBC WITH DIFFERENTIAL/PLATELET
Abs Immature Granulocytes: 0.02 10*3/uL (ref 0.00–0.07)
Basophils Absolute: 0 10*3/uL (ref 0.0–0.1)
Basophils Relative: 1 %
Eosinophils Absolute: 0 10*3/uL (ref 0.0–0.5)
Eosinophils Relative: 1 %
HCT: 35.3 % — ABNORMAL LOW (ref 36.0–46.0)
Hemoglobin: 11 g/dL — ABNORMAL LOW (ref 12.0–15.0)
Immature Granulocytes: 0 %
Lymphocytes Relative: 21 %
Lymphs Abs: 1.2 10*3/uL (ref 0.7–4.0)
MCH: 28.7 pg (ref 26.0–34.0)
MCHC: 31.2 g/dL (ref 30.0–36.0)
MCV: 92.2 fL (ref 80.0–100.0)
Monocytes Absolute: 0.8 10*3/uL (ref 0.1–1.0)
Monocytes Relative: 15 %
Neutro Abs: 3.6 10*3/uL (ref 1.7–7.7)
Neutrophils Relative %: 62 %
Platelets: 224 10*3/uL (ref 150–400)
RBC: 3.83 MIL/uL — ABNORMAL LOW (ref 3.87–5.11)
RDW: 14.6 % (ref 11.5–15.5)
WBC: 5.6 10*3/uL (ref 4.0–10.5)
nRBC: 0 % (ref 0.0–0.2)

## 2018-10-01 LAB — COMPREHENSIVE METABOLIC PANEL
ALT: 20 U/L (ref 0–44)
AST: 18 U/L (ref 15–41)
Albumin: 3.8 g/dL (ref 3.5–5.0)
Alkaline Phosphatase: 109 U/L (ref 38–126)
Anion gap: 12 (ref 5–15)
BUN: 33 mg/dL — ABNORMAL HIGH (ref 6–20)
CO2: 24 mmol/L (ref 22–32)
Calcium: 11.5 mg/dL — ABNORMAL HIGH (ref 8.9–10.3)
Chloride: 106 mmol/L (ref 98–111)
Creatinine, Ser: 1.64 mg/dL — ABNORMAL HIGH (ref 0.44–1.00)
GFR calc Af Amer: 42 mL/min — ABNORMAL LOW (ref >60)
GFR calc non Af Amer: 37 mL/min — ABNORMAL LOW (ref >60)
Glucose, Bld: 115 mg/dL — ABNORMAL HIGH (ref 70–99)
Potassium: 4 mmol/L (ref 3.5–5.1)
Sodium: 142 mmol/L (ref 135–145)
Total Bilirubin: 0.3 mg/dL (ref 0.3–1.2)
Total Protein: 7.6 g/dL (ref 6.5–8.1)

## 2018-10-01 MED ORDER — DIPHENHYDRAMINE HCL 25 MG PO CAPS
ORAL_CAPSULE | ORAL | Status: AC
  Filled 2018-10-01: qty 1

## 2018-10-01 MED ORDER — TRASTUZUMAB CHEMO 150 MG IV SOLR
6.0000 mg/kg | Freq: Once | INTRAVENOUS | Status: AC
  Administered 2018-10-01: 588 mg via INTRAVENOUS
  Filled 2018-10-01: qty 28

## 2018-10-01 MED ORDER — SODIUM CHLORIDE 0.9% FLUSH
10.0000 mL | INTRAVENOUS | Status: DC | PRN
  Administered 2018-10-01: 10 mL
  Filled 2018-10-01: qty 10

## 2018-10-01 MED ORDER — DIPHENHYDRAMINE HCL 25 MG PO CAPS
25.0000 mg | ORAL_CAPSULE | Freq: Once | ORAL | Status: AC
  Administered 2018-10-01: 25 mg via ORAL

## 2018-10-01 MED ORDER — RADIAPLEXRX EX GEL
Freq: Once | CUTANEOUS | Status: AC
  Administered 2018-10-01: 13:00:00 via TOPICAL

## 2018-10-01 MED ORDER — HEPARIN SOD (PORK) LOCK FLUSH 100 UNIT/ML IV SOLN
500.0000 [IU] | Freq: Once | INTRAVENOUS | Status: AC | PRN
  Administered 2018-10-01: 500 [IU]
  Filled 2018-10-01: qty 5

## 2018-10-01 MED ORDER — ALRA NON-METALLIC DEODORANT (RAD-ONC)
1.0000 "application " | Freq: Once | TOPICAL | Status: AC
  Administered 2018-10-01: 1 via TOPICAL

## 2018-10-01 MED ORDER — ACETAMINOPHEN 325 MG PO TABS
ORAL_TABLET | ORAL | Status: AC
  Filled 2018-10-01: qty 2

## 2018-10-01 MED ORDER — SODIUM CHLORIDE 0.9% FLUSH
10.0000 mL | Freq: Once | INTRAVENOUS | Status: AC
  Administered 2018-10-01: 10 mL
  Filled 2018-10-01: qty 10

## 2018-10-01 MED ORDER — ACETAMINOPHEN 325 MG PO TABS
650.0000 mg | ORAL_TABLET | Freq: Once | ORAL | Status: AC
  Administered 2018-10-01: 650 mg via ORAL

## 2018-10-01 MED ORDER — SODIUM CHLORIDE 0.9 % IV SOLN
Freq: Once | INTRAVENOUS | Status: AC
  Administered 2018-10-01: 14:00:00 via INTRAVENOUS
  Filled 2018-10-01: qty 250

## 2018-10-01 NOTE — Patient Instructions (Signed)
[facility] Discharge Instructions for Patients Receiving Chemotherapy  Today you received the following chemotherapy agents: Trastuzumab (Herceptin)  To help prevent nausea and vomiting after your treatment, we encourage you to take your nausea medication as directed.    If you develop nausea and vomiting that is not controlled by your nausea medication, call the clinic.   BELOW ARE SYMPTOMS THAT SHOULD BE REPORTED IMMEDIATELY:  *FEVER GREATER THAN 100.5 F  *CHILLS WITH OR WITHOUT FEVER  NAUSEA AND VOMITING THAT IS NOT CONTROLLED WITH YOUR NAUSEA MEDICATION  *UNUSUAL SHORTNESS OF BREATH  *UNUSUAL BRUISING OR BLEEDING  TENDERNESS IN MOUTH AND THROAT WITH OR WITHOUT PRESENCE OF ULCERS  *URINARY PROBLEMS  *BOWEL PROBLEMS  UNUSUAL RASH Items with * indicate a potential emergency and should be followed up as soon as possible.  Feel free to call the clinic should you have any questions or concerns. The clinic phone number is [phone].  Please show the CHEMO ALERT CARD at check-in to the Emergency Department and triage nurse.    

## 2018-10-01 NOTE — Progress Notes (Signed)

## 2018-10-02 ENCOUNTER — Other Ambulatory Visit: Payer: Self-pay

## 2018-10-02 ENCOUNTER — Ambulatory Visit: Payer: 59 | Admitting: Physical Therapy

## 2018-10-02 ENCOUNTER — Encounter: Payer: Self-pay | Admitting: Physical Therapy

## 2018-10-02 ENCOUNTER — Ambulatory Visit
Admission: RE | Admit: 2018-10-02 | Discharge: 2018-10-02 | Disposition: A | Discharge status: Home / Self Care | Payer: 59 | Source: Ambulatory Visit | Attending: Radiation Oncology | Admitting: Radiation Oncology

## 2018-10-02 DIAGNOSIS — Z170 Estrogen receptor positive status [ER+]: Secondary | ICD-10-CM | POA: Insufficient documentation

## 2018-10-02 DIAGNOSIS — C50411 Malignant neoplasm of upper-outer quadrant of right female breast: Secondary | ICD-10-CM | POA: Insufficient documentation

## 2018-10-02 DIAGNOSIS — Z510 Encounter for antineoplastic radiation therapy: Secondary | ICD-10-CM | POA: Diagnosis not present

## 2018-10-02 DIAGNOSIS — M6281 Muscle weakness (generalized): Secondary | ICD-10-CM

## 2018-10-02 DIAGNOSIS — M25611 Stiffness of right shoulder, not elsewhere classified: Secondary | ICD-10-CM

## 2018-10-02 LAB — FOLLICLE STIMULATING HORMONE: FSH: 64.3 m[IU]/mL

## 2018-10-02 NOTE — Patient Instructions (Signed)

## 2018-10-02 NOTE — Therapy (Addendum)
[place], [place], [address] Phone: [phone]   Fax:  [phone]  Physical Therapy Treatment  Patient Details  Name: [name] [name] MRN: [id number] Date of Birth: August 26, 1971 Referring Provider (PT): Cause[facility]   Encounter Date: 10/02/2018  PT End of Session - 10/02/18 1057    Visit Number  3    Number of Visits  9    Date for PT Re-Evaluation  10/22/18    PT Start Time  4403    PT Stop Time  1057    PT Time Calculation (min)  43 min    Activity Tolerance  Patient tolerated treatment well    Behavior During Therapy  [facility] for tasks assessed/performed       Past Medical History:  Diagnosis Date  . Anxiety   . Breast cancer, left breast ([place]) 2000   Underwent lumpectomy, chemotherapy and radiation  . Facial paralysis/Bells palsy    left side  . Family history of lung cancer   . Family history of lymphoma   . Hypertension   . Neuropathy    IN FINGERS AND TOES   RECENT INFUSION OF CHEMO  . Personal history of breast cancer 05/06/2018  . Renal disorder    just after TIA acute kidney injury  . TIA (transient ischemic attack) 03/16/2017   [place] [facility]    Past Surgical History:  Procedure Laterality Date  . BREAST LUMPECTOMY WITH AXILLARY LYMPH NODE BIOPSY Left 2000   biopsy  . BREAST SURGERY     partial mastectomy with lymph node removal  . MASTECTOMY MODIFIED RADICAL Right 08/26/2018   Procedure: MASTECTOMY MODIFIED RADICAL;  Surgeon: Jovita Kussmaul, MD;  Location: [place];  Service: General;  Laterality: Right;  . MODIFIED MASTECTOMY Right 08/26/2018  . PORTACATH PLACEMENT Left 04/08/2018   Procedure: INSERTION PORT-A-CATH;  Surgeon: Jovita Kussmaul, MD;  Location: [place];  Service: General;  Laterality: Left;  . TUBAL LIGATION Bilateral 2004  . WISDOM TOOTH EXTRACTION      There were no vitals filed for this visit.  Subjective Assessment - 10/02/18 1016    Subjective  My shoulders are  going well. I like the sword exercise.     Pertinent History  2000 L partial mastectomy with axillary node dissection, completed chemo and radiation, 08/26/18- Ductal carcinoma in situ, high grade, with necrosis and calcifications. Prognostic indicators significant for: estrogen receptor, 90% positive and progesterone receptor, 40% positive, both with strong staining intensity. Proliferation marker Ki67 at 80%. HER2 amplified with ratios HER2/CEP17 SIGNALS 6.90 and average HER2 copies per cell 14.50, R mastectomy with 6 nodes removed, completed chemo and will begin radiation next week,, kadcyla    Patient Stated Goals  to be able to have full ROM of R shoulder    Currently in Pain?  No/denies    Pain Score  0-No pain         [facility] PT Assessment - 10/02/18 0001      AROM   Right Shoulder Flexion  171 Degrees    Right Shoulder ABduction  170 Degrees                   [facility] Adult PT Treatment/Exercise - 10/02/18 0001      Exercises   Exercises  Shoulder      Shoulder Exercises: Supine   Horizontal ABduction  Strengthening;Both;10 reps   pt returned therapist demo   Theraband Level (Shoulder Horizontal ABduction)  Level 3 (Green)  External Rotation  Strengthening;Both;10 reps   pt returned therapist demo   Theraband Level (Shoulder External Rotation)  Level 3 (Green)    Flexion  Strengthening;Both;10 reps   narrow and wide grip, pt returned therapist demo   Theraband Level (Shoulder Flexion)  Level 3 (Green)    Diagonals  Strengthening;Both;10 reps   pt returned therapist demo   Theraband Level (Shoulder Diagonals)  Level 3 (Green)      Shoulder Exercises: Standing   Other Standing Exercises  3 way shoulder with back against wall using 2 lb weights x 10 reps each with pt returning therapist demo      Shoulder Exercises: Pulleys   Flexion  2 minutes   pt returned therapist demo   ABduction  2 minutes   pt returned therapist demo     Shoulder Exercises: Therapy Ball    Flexion  Both;10 reps   with stretch at top, pt returned therapist demo   ABduction  10 reps;Right   with stretch at top, pt returned therapist demo     Manual Therapy   Manual Therapy  Passive ROM;Myofascial release    Myofascial Release  to R axilla using cross hands technique    Passive ROM  to R shoulder in direction of flexion, abduction, D2                  PT Long Term Goals - 10/02/18 1100      PT LONG TERM GOAL #1   Title  Pt will demonstrate 170 degrees of shoulder abduction to allow pt to reach out to sides     Baseline  119, 10/02/18 - 170    Time  4    Period  Weeks    Status  Achieved      PT LONG TERM GOAL #2   Title  Pt will demonstrate 170 degrees of shoulder flexion to allow pt to reach overhead    Baseline  136, 10/02/18- 171    Time  4    Period  Weeks    Status  Achieved      PT LONG TERM GOAL #3   Title  Pt will report a 50% improvement in numbness and tingling in fingers and toes to allow improved comfort    Time  4    Period  Weeks    Status  On-going      PT LONG TERM GOAL #4   Title  Pt will be independent in a home exercise program for continued strengthening and stretching    Time  4    Period  Weeks    Status  On-going      PT LONG TERM GOAL #5   Title  Pt will be able to independently verbalize lymphedema risk reduction practices    Time  4    Period  Weeks    Status  On-going            Plan - 10/02/18 1058    Clinical Impression Statement  Instructed pt in 3 way shoulder and added this to her home exercise program. Increased resistance of supine scap exercises to green band today and pt did very well with this. She demonstrates full R shoulder ROM at this time. Will instruct pt in Strength ABC program at next session for continued strengthening and stretching at home.     Rehab Potential  Good    Clinical Impairments Affecting Rehab Potential  previous hx of left breast cancer with radiation,  pt beginning radiation  next week for R breast cancer    PT Frequency  2x / week    PT Duration  4 weeks    PT Treatment/Interventions  ADLs/Self Care Home Management;Therapeutic exercise;Therapeutic activities;Patient/family education;Scar mobilization;Manual lymph drainage;Manual techniques;Passive range of motion;Compression bandaging;Vasopneumatic Device;Taping    PT Next Visit Plan  instruct pt in Strength ABC program and once she is indepenent with this decrease to 1 x every other week while pt goes through radiation    PT Home Exercise Plan  post op breast exercises    Consulted and Agree with Plan of Care  Patient       Patient will benefit from skilled therapeutic intervention in order to improve the following deficits and impairments:  Pain, Increased fascial restricitons, Decreased scar mobility, Postural dysfunction, Decreased range of motion, Decreased strength, Impaired UE functional use, Decreased knowledge of precautions, Increased edema  Visit Diagnosis: Stiffness of right shoulder, not elsewhere classified  Muscle weakness (generalized)     Problem List Patient Active Problem List   Diagnosis Date Noted  . Cancer of right female breast ([place]) 08/26/2018  . Genetic testing 08/06/2018  . Morbid obesity with BMI of 40.0-44.9, adult ([place]) 07/09/2018  . Port-A-Cath in place 05/07/2018  . Personal history of breast cancer 05/06/2018  . Family history of lymphoma   . Family history of lung cancer   . Essential hypertension 04/01/2018  . Pre-operative cardiovascular examination 04/01/2018  . Malignant neoplasm of upper-outer quadrant of right breast in female, estrogen receptor positive ([place]) 03/19/2018  . History of TIA (transient ischemic attack) 03/17/2017  . Fibroids 08/02/2014    Allyson Sabal [facility] 10/02/2018, 11:57 AM  [place], [place], [address] Phone: [phone]   Fax:  [phone]  Name: [name]  [name] MRN: [id number] Date of Birth: 15-Dec-1970  Manus Gunning, PT 10/02/18 11:57 AM

## 2018-10-05 ENCOUNTER — Ambulatory Visit
Admission: RE | Admit: 2018-10-05 | Discharge: 2018-10-05 | Disposition: A | Discharge status: Home / Self Care | Payer: 59 | Source: Ambulatory Visit | Attending: Radiation Oncology | Admitting: Radiation Oncology

## 2018-10-05 DIAGNOSIS — Z510 Encounter for antineoplastic radiation therapy: Secondary | ICD-10-CM | POA: Diagnosis not present

## 2018-10-05 DIAGNOSIS — C50411 Malignant neoplasm of upper-outer quadrant of right female breast: Secondary | ICD-10-CM | POA: Diagnosis not present

## 2018-10-05 LAB — ESTRADIOL, ULTRA SENS: Estradiol, Sensitive: 7 pg/mL

## 2018-10-06 ENCOUNTER — Ambulatory Visit
Admission: RE | Admit: 2018-10-06 | Discharge: 2018-10-06 | Disposition: A | Discharge status: Home / Self Care | Payer: 59 | Source: Ambulatory Visit | Attending: Radiation Oncology | Admitting: Radiation Oncology

## 2018-10-06 ENCOUNTER — Ambulatory Visit: Payer: 59 | Admitting: Physical Therapy

## 2018-10-06 DIAGNOSIS — M6281 Muscle weakness (generalized): Secondary | ICD-10-CM

## 2018-10-06 DIAGNOSIS — Z510 Encounter for antineoplastic radiation therapy: Secondary | ICD-10-CM | POA: Diagnosis not present

## 2018-10-06 DIAGNOSIS — M25611 Stiffness of right shoulder, not elsewhere classified: Secondary | ICD-10-CM

## 2018-10-06 DIAGNOSIS — C50411 Malignant neoplasm of upper-outer quadrant of right female breast: Secondary | ICD-10-CM | POA: Diagnosis not present

## 2018-10-06 DIAGNOSIS — R600 Localized edema: Secondary | ICD-10-CM

## 2018-10-06 NOTE — Therapy (Signed)
[place], [place], [address] Phone: [phone]   Fax:  [phone]  Physical Therapy Treatment  Patient Details  Name: [name] [name] MRN: [id number] Date of Birth: [birth year] Referring Provider (PT): Causey   Encounter Date: 10/06/2018  PT End of Session - 10/06/18 1208    Visit Number  4    Number of Visits  9    Date for PT Re-Evaluation  10/22/18    PT Start Time  1112    PT Stop Time  1155    PT Time Calculation (min)  43 min    Activity Tolerance  Patient tolerated treatment well    Behavior During Therapy  [facility] for tasks assessed/performed       Past Medical History:  Diagnosis Date  . Anxiety   . Breast cancer, left breast ([place]) 2000   Underwent lumpectomy, chemotherapy and radiation  . Facial paralysis/Bells palsy    left side  . Family history of lung cancer   . Family history of lymphoma   . Hypertension   . Neuropathy    IN FINGERS AND TOES   RECENT INFUSION OF CHEMO  . Personal history of breast cancer 05/06/2018  . Renal disorder    just after TIA acute kidney injury  . TIA (transient ischemic attack) 03/16/2017   [facility]    Past Surgical History:  Procedure Laterality Date  . BREAST LUMPECTOMY WITH AXILLARY LYMPH NODE BIOPSY Left 2000   biopsy  . BREAST SURGERY     partial mastectomy with lymph node removal  . MASTECTOMY MODIFIED RADICAL Right 08/26/2018   Procedure: MASTECTOMY MODIFIED RADICAL;  Surgeon: Jovita Kussmaul, MD;  Location: [facility];  Service: General;  Laterality: Right;  . MODIFIED MASTECTOMY Right 08/26/2018  . PORTACATH PLACEMENT Left 04/08/2018   Procedure: INSERTION PORT-A-CATH;  Surgeon: Jovita Kussmaul, MD;  Location: [facility];  Service: General;  Laterality: Left;  . TUBAL LIGATION Bilateral 2004  . WISDOM TOOTH EXTRACTION      There were no vitals filed for this visit.  Subjective Assessment - 10/06/18 1114    Subjective  Nothing new today.  Doing okay. I have the green band.  I could only find the five point weights. I have something odd going on with my left arm--it seems a little puffy.    Pertinent History  2000 L partial mastectomy with axillary node dissection, completed chemo and radiation, 08/26/18- Ductal carcinoma in situ, high grade, with necrosis and calcifications. Prognostic indicators significant for: estrogen receptor, 90% positive and progesterone receptor, 40% positive, both with strong staining intensity. Proliferation marker Ki67 at 80%. HER2 amplified with ratios HER2/CEP17 SIGNALS 6.90 and average HER2 copies per cell 14.50, R mastectomy with 6 nodes removed, completed chemo and will begin radiation next week,, kadcyla    Patient Stated Goals  to be able to have full ROM of R shoulder    Currently in Pain?  No/denies            LYMPHEDEMA/ONCOLOGY QUESTIONNAIRE - 10/06/18 1117      Left Upper Extremity Lymphedema   15 cm Proximal to Olecranon Process  39.9 cm    Olecranon Process  32 cm    15 cm Proximal to Ulnar Styloid Process  31.8 cm    Just Proximal to Ulnar Styloid Process  21.9 cm    Across Hand at [employer]  20.7 cm    At [place] of 2nd Digit  7.1 cm  [place] Adult PT Treatment/Exercise - 10/06/18 0001      Exercises   Other Exercises   Began instructing patient in strength ABC program with general instruction and introduction to the handouts, then taught and had patient perform all stretches today.      Manual Therapy   Manual Therapy  Edema management    Edema Management  Measured left arm circumferences after patient reported feeling it was puffy and because she had ALND on that side years ago.  Measurements were not concerning, but gave patient TG soft (small for hand and forearm) and medium for upper arm for mild compression. Also wrote a prescription and gave it to patient to take to her radiation oncologist on Friday for signature for a class I compression sleeve  and gauntlet for left and right arms.             PT Education - 10/06/18 1207    Education Details  strength ABC program--only through general instruction on the program and the stretching part    Methods  Explanation;Demonstration;Verbal cues;Handout    Comprehension  Verbalized understanding;Returned demonstration          PT Long Term Goals - 10/02/18 1100      PT LONG TERM GOAL #1   Title  Pt will demonstrate 170 degrees of shoulder abduction to allow pt to reach out to sides     Baseline  119, 10/02/18 - 170    Time  4    Period  Weeks    Status  Achieved      PT LONG TERM GOAL #2   Title  Pt will demonstrate 170 degrees of shoulder flexion to allow pt to reach overhead    Baseline  136, 10/02/18- 171    Time  4    Period  Weeks    Status  Achieved      PT LONG TERM GOAL #3   Title  Pt will report a 50% improvement in numbness and tingling in fingers and toes to allow improved comfort    Time  4    Period  Weeks    Status  On-going      PT LONG TERM GOAL #4   Title  Pt will be independent in a home exercise program for continued strengthening and stretching    Time  4    Period  Weeks    Status  On-going      PT LONG TERM GOAL #5   Title  Pt will be able to independently verbalize lymphedema risk reduction practices    Time  4    Period  Weeks    Status  On-going            Plan - 10/06/18 1208    Clinical Impression Statement  Checked patient's left arm circumferences because she said her arm felt swollen, but those seemed little changed; did give TG soft for that arm as well as prescription for compression sleeve and gauntlet for patient to have signed by rad onc on Friday. She will need help to obtain these garments because her insurance is [facility]. Then began instruction in strength ABC program and got through stretching only.    Rehab Potential  Good    Clinical Impairments Affecting Rehab Potential  previous hx of left breast cancer  with radiation, pt beginning radiation next week for R breast cancer    PT Frequency  2x / week    PT Duration  4  weeks    PT Treatment/Interventions  ADLs/Self Care Home Management;Therapeutic exercise;Therapeutic activities;Patient/family education;Scar mobilization;Manual lymph drainage;Manual techniques;Passive range of motion;Compression bandaging;Vasopneumatic Device;Taping    PT Next Visit Plan  Continue with strength ABC program instruction with review of stretching and then go to core and resistance exercises. Assist her with obtaining sleeves and gauntlets with her [facility] insurance.    PT Home Exercise Plan  post op breast exercises; stretches only from strength ABC program so far    Consulted and Agree with Plan of Care  Patient       Patient will benefit from skilled therapeutic intervention in order to improve the following deficits and impairments:  Pain, Increased fascial restricitons, Decreased scar mobility, Postural dysfunction, Decreased range of motion, Decreased strength, Impaired UE functional use, Decreased knowledge of precautions, Increased edema  Visit Diagnosis: Stiffness of right shoulder, not elsewhere classified  Muscle weakness (generalized)  Localized edema     Problem List Patient Active Problem List   Diagnosis Date Noted  . Cancer of right female breast ([place]) 08/26/2018  . Genetic testing 08/06/2018  . Morbid obesity with BMI of 40.0-44.9, adult ([place]) 07/09/2018  . Port-A-Cath in place 05/07/2018  . Personal history of breast cancer 05/06/2018  . Family history of lymphoma   . Family history of lung cancer   . Essential hypertension 04/01/2018  . Pre-operative cardiovascular examination 04/01/2018  . Malignant neoplasm of upper-outer quadrant of right breast in female, estrogen receptor positive ([place]) 03/19/2018  . History of TIA (transient ischemic attack) 03/17/2017  . Fibroids 08/02/2014    Cowen Pesqueira 10/06/2018, 12:14 PM  [place], [place], [address] Phone: [phone]   Fax:  [phone]  Name: [name] Axon MRN: [id number] Date of Birth: 1971/11/22  Serafina Royals, PT 10/06/18 12:14 PM

## 2018-10-07 ENCOUNTER — Ambulatory Visit
Admission: RE | Admit: 2018-10-07 | Discharge: 2018-10-07 | Disposition: A | Discharge status: Home / Self Care | Payer: 59 | Source: Ambulatory Visit | Attending: Radiation Oncology | Admitting: Radiation Oncology

## 2018-10-07 ENCOUNTER — Ambulatory Visit: Payer: 59

## 2018-10-07 ENCOUNTER — Other Ambulatory Visit: Payer: Self-pay | Admitting: *Deleted

## 2018-10-07 DIAGNOSIS — M6281 Muscle weakness (generalized): Secondary | ICD-10-CM

## 2018-10-07 DIAGNOSIS — R600 Localized edema: Secondary | ICD-10-CM

## 2018-10-07 DIAGNOSIS — R208 Other disturbances of skin sensation: Secondary | ICD-10-CM

## 2018-10-07 DIAGNOSIS — I890 Lymphedema, not elsewhere classified: Secondary | ICD-10-CM | POA: Insufficient documentation

## 2018-10-07 DIAGNOSIS — C50411 Malignant neoplasm of upper-outer quadrant of right female breast: Secondary | ICD-10-CM | POA: Diagnosis not present

## 2018-10-07 DIAGNOSIS — Z510 Encounter for antineoplastic radiation therapy: Secondary | ICD-10-CM | POA: Diagnosis not present

## 2018-10-07 DIAGNOSIS — M25611 Stiffness of right shoulder, not elsewhere classified: Secondary | ICD-10-CM

## 2018-10-07 NOTE — Therapy (Signed)
[place], [place], [address] Phone: [phone]   Fax:  [phone]  Physical Therapy Treatment  Patient Details  Name: [name] [name] MRN: [id number] Date of Birth: Jun 10, 1971 Referring Provider (PT): Causey   Encounter Date: 10/07/2018  PT End of Session - 10/07/18 1406    Visit Number  5    Number of Visits  9    Date for PT Re-Evaluation  10/22/18    PT Start Time  38   Pt arrived late   PT Stop Time  1358    PT Time Calculation (min)  49 min    Activity Tolerance  Patient tolerated treatment well    Behavior During Therapy  [facility] for tasks assessed/performed       Past Medical History:  Diagnosis Date  . Anxiety   . Breast cancer, left breast ([place]) 2000   Underwent lumpectomy, chemotherapy and radiation  . Facial paralysis/Bells palsy    left side  . Family history of lung cancer   . Family history of lymphoma   . Hypertension   . Neuropathy    IN FINGERS AND TOES   RECENT INFUSION OF CHEMO  . Personal history of breast cancer 05/06/2018  . Renal disorder    just after TIA acute kidney injury  . TIA (transient ischemic attack) 03/16/2017   [facility]    Past Surgical History:  Procedure Laterality Date  . BREAST LUMPECTOMY WITH AXILLARY LYMPH NODE BIOPSY Left 2000   biopsy  . BREAST SURGERY     partial mastectomy with lymph node removal  . MASTECTOMY MODIFIED RADICAL Right 08/26/2018   Procedure: MASTECTOMY MODIFIED RADICAL;  Surgeon: Jovita Kussmaul, MD;  Location: [facility];  Service: General;  Laterality: Right;  . MODIFIED MASTECTOMY Right 08/26/2018  . PORTACATH PLACEMENT Left 04/08/2018   Procedure: INSERTION PORT-A-CATH;  Surgeon: Jovita Kussmaul, MD;  Location: [place];  Service: General;  Laterality: Left;  . TUBAL LIGATION Bilateral 2004  . WISDOM TOOTH EXTRACTION      There were no vitals filed for this visit.  Subjective Assessment - 10/07/18 1319    Subjective  I'm  sorry I ran late today. I got my radiation and PT appt times mixed up and went to the [facility] first. I am noticing my Lt arm is really beginning to feel more full and even some pitting edema today from carrying my purse strap all morning.     Pertinent History  2000 L partial mastectomy with axillary node dissection, completed chemo and radiation, 08/26/18- Ductal carcinoma in situ, high grade, with necrosis and calcifications. Prognostic indicators significant for: estrogen receptor, 90% positive and progesterone receptor, 40% positive, both with strong staining intensity. Proliferation marker Ki67 at 80%. HER2 amplified with ratios HER2/CEP17 SIGNALS 6.90 and average HER2 copies per cell 14.50, R mastectomy with 6 nodes removed, completed chemo and will begin radiation next week,, kadcyla    Patient Stated Goals  to be able to have full ROM of R shoulder    Currently in Pain?  No/denies                       [facility] Adult PT Treatment/Exercise - 10/07/18 0001      Self-Care   Self-Care  Other Self-Care Comments    Other Self-Care Comments   Answered pts questions regarding lymphedema and instructed her how to self assess in front of mirror having pt demonstrate  Exercises   Other Exercises   Completed instruction of Strength ABC Program ; pt did very well returning correct demonstration of all exercises requiring min VCs for technique, did require mod VCs and tactile cues with quadruped alt UE/LE to keep core engaged and decrease lumbar extension with LE ROM.                  PT Long Term Goals - 10/02/18 1100      PT LONG TERM GOAL #1   Title  Pt will demonstrate 170 degrees of shoulder abduction to allow pt to reach out to sides     Baseline  119, 10/02/18 - 170    Time  4    Period  Weeks    Status  Achieved      PT LONG TERM GOAL #2   Title  Pt will demonstrate 170 degrees of shoulder flexion to allow pt to reach overhead    Baseline  136, 10/02/18-  171    Time  4    Period  Weeks    Status  Achieved      PT LONG TERM GOAL #3   Title  Pt will report a 50% improvement in numbness and tingling in fingers and toes to allow improved comfort    Time  4    Period  Weeks    Status  On-going      PT LONG TERM GOAL #4   Title  Pt will be independent in a home exercise program for continued strengthening and stretching    Time  4    Period  Weeks    Status  On-going      PT LONG TERM GOAL #5   Title  Pt will be able to independently verbalize lymphedema risk reduction practices    Time  4    Period  Weeks    Status  On-going            Plan - 10/07/18 1409    Clinical Impression Statement  Beginning of session showed pt how to self check Lt and Rt UE for lymphedema. Her Lt UE is fuller visibly at wrist and forearm with end elbow flexion, also pitting edema was present at her forearm from her purse strap being there earlier. Reissued new order with class II flat knit for Lt UE and nighttime garment, thinking velcro so pt can wear for a short period of time during day to retain maximum reduction...wait to meausure flat knit?? Pt did excellent with remaining instruction of Strength ABC Program. Able to follow cues to correct technique. Most difficulty with quadruped due to core weakness but pt able to verbalize understanding of this. She may get measured for compression garments Friday depending on fitters schedule.    Rehab Potential  Good    Clinical Impairments Affecting Rehab Potential  previous hx of left breast cancer with radiation, pt beginning radiation next week for R breast cancer    PT Frequency  2x / week    PT Duration  4 weeks    PT Treatment/Interventions  ADLs/Self Care Home Management;Therapeutic exercise;Therapeutic activities;Patient/family education;Scar mobilization;Manual lymph drainage;Manual techniques;Passive range of motion;Compression bandaging;Vasopneumatic Device;Taping    PT Next Visit Plan  Did she get  measured Friday? If not help pt with this. Review strength ABC program prn, especially quadruped though. See if Lt UE was added to POC, PT to renew and add and then instruct in [facility] lymph drainage.     Consulted and  Agree with Plan of Care  Patient       Patient will benefit from skilled therapeutic intervention in order to improve the following deficits and impairments:  Pain, Increased fascial restricitons, Decreased scar mobility, Postural dysfunction, Decreased range of motion, Decreased strength, Impaired UE functional use, Decreased knowledge of precautions, Increased edema  Visit Diagnosis: Stiffness of right shoulder, not elsewhere classified  Muscle weakness (generalized)  Localized edema  Other disturbances of skin sensation     Problem List Patient Active Problem List   Diagnosis Date Noted  . Lymphedema of left arm 10/07/2018  . Cancer of right female breast ([place]) 08/26/2018  . Genetic testing 08/06/2018  . Morbid obesity with BMI of 40.0-44.9, adult ([place]) 07/09/2018  . Port-A-Cath in place 05/07/2018  . Personal history of breast cancer 05/06/2018  . Family history of lymphoma   . Family history of lung cancer   . Essential hypertension 04/01/2018  . Pre-operative cardiovascular examination 04/01/2018  . Malignant neoplasm of upper-outer quadrant of right breast in female, estrogen receptor positive ([place]) 03/19/2018  . History of TIA (transient ischemic attack) 03/17/2017  . Fibroids 08/02/2014    Otelia Limes, PTA 10/07/2018, 4:07 PM  [place], [place], [address] Phone: [phone]   Fax:  [phone]  Name: [name] [name] MRN: [id number] Date of Birth: 1971-08-31

## 2018-10-08 ENCOUNTER — Ambulatory Visit
Admission: RE | Admit: 2018-10-08 | Discharge: 2018-10-08 | Disposition: A | Discharge status: Home / Self Care | Payer: 59 | Source: Ambulatory Visit | Attending: Radiation Oncology | Admitting: Radiation Oncology

## 2018-10-08 ENCOUNTER — Encounter: Payer: 59 | Admitting: Physical Therapy

## 2018-10-08 DIAGNOSIS — C50411 Malignant neoplasm of upper-outer quadrant of right female breast: Secondary | ICD-10-CM | POA: Diagnosis not present

## 2018-10-08 DIAGNOSIS — Z510 Encounter for antineoplastic radiation therapy: Secondary | ICD-10-CM | POA: Diagnosis not present

## 2018-10-09 ENCOUNTER — Ambulatory Visit
Admission: RE | Admit: 2018-10-09 | Discharge: 2018-10-09 | Disposition: A | Discharge status: Home / Self Care | Payer: 59 | Source: Ambulatory Visit | Attending: Radiation Oncology | Admitting: Radiation Oncology

## 2018-10-09 DIAGNOSIS — C50411 Malignant neoplasm of upper-outer quadrant of right female breast: Secondary | ICD-10-CM | POA: Diagnosis not present

## 2018-10-09 DIAGNOSIS — Z510 Encounter for antineoplastic radiation therapy: Secondary | ICD-10-CM | POA: Diagnosis not present

## 2018-10-12 ENCOUNTER — Ambulatory Visit: Payer: 59 | Admitting: Rehabilitation

## 2018-10-12 ENCOUNTER — Ambulatory Visit
Admission: RE | Admit: 2018-10-12 | Discharge: 2018-10-12 | Disposition: A | Discharge status: Home / Self Care | Payer: 59 | Source: Ambulatory Visit | Attending: Radiation Oncology | Admitting: Radiation Oncology

## 2018-10-12 DIAGNOSIS — C50411 Malignant neoplasm of upper-outer quadrant of right female breast: Secondary | ICD-10-CM | POA: Diagnosis not present

## 2018-10-12 DIAGNOSIS — Z510 Encounter for antineoplastic radiation therapy: Secondary | ICD-10-CM | POA: Diagnosis not present

## 2018-10-13 ENCOUNTER — Ambulatory Visit
Admission: RE | Admit: 2018-10-13 | Discharge: 2018-10-13 | Disposition: A | Discharge status: Home / Self Care | Payer: 59 | Source: Ambulatory Visit | Attending: Radiation Oncology | Admitting: Radiation Oncology

## 2018-10-13 DIAGNOSIS — Z510 Encounter for antineoplastic radiation therapy: Secondary | ICD-10-CM | POA: Diagnosis not present

## 2018-10-13 DIAGNOSIS — C50411 Malignant neoplasm of upper-outer quadrant of right female breast: Secondary | ICD-10-CM | POA: Diagnosis not present

## 2018-10-14 ENCOUNTER — Ambulatory Visit
Admission: RE | Admit: 2018-10-14 | Discharge: 2018-10-14 | Disposition: A | Discharge status: Home / Self Care | Payer: 59 | Source: Ambulatory Visit | Attending: Radiation Oncology | Admitting: Radiation Oncology

## 2018-10-14 DIAGNOSIS — Z510 Encounter for antineoplastic radiation therapy: Secondary | ICD-10-CM | POA: Diagnosis not present

## 2018-10-14 DIAGNOSIS — Z170 Estrogen receptor positive status [ER+]: Secondary | ICD-10-CM | POA: Diagnosis not present

## 2018-10-14 DIAGNOSIS — C50411 Malignant neoplasm of upper-outer quadrant of right female breast: Secondary | ICD-10-CM | POA: Diagnosis not present

## 2018-10-15 ENCOUNTER — Ambulatory Visit: Payer: 59 | Admitting: Physical Therapy

## 2018-10-15 ENCOUNTER — Other Ambulatory Visit: Payer: Self-pay

## 2018-10-15 ENCOUNTER — Encounter: Payer: Self-pay | Admitting: Physical Therapy

## 2018-10-15 ENCOUNTER — Ambulatory Visit
Admission: RE | Admit: 2018-10-15 | Discharge: 2018-10-15 | Disposition: A | Discharge status: Home / Self Care | Payer: 59 | Source: Ambulatory Visit | Attending: Radiation Oncology | Admitting: Radiation Oncology

## 2018-10-15 DIAGNOSIS — R208 Other disturbances of skin sensation: Secondary | ICD-10-CM

## 2018-10-15 DIAGNOSIS — C50411 Malignant neoplasm of upper-outer quadrant of right female breast: Secondary | ICD-10-CM | POA: Diagnosis not present

## 2018-10-15 DIAGNOSIS — Z510 Encounter for antineoplastic radiation therapy: Secondary | ICD-10-CM | POA: Diagnosis not present

## 2018-10-15 DIAGNOSIS — R600 Localized edema: Secondary | ICD-10-CM

## 2018-10-15 DIAGNOSIS — M25611 Stiffness of right shoulder, not elsewhere classified: Secondary | ICD-10-CM

## 2018-10-15 DIAGNOSIS — M6281 Muscle weakness (generalized): Secondary | ICD-10-CM

## 2018-10-15 NOTE — Therapy (Signed)
[place], [place], [address] Phone: [phone]   Fax:  [phone]  Physical Therapy Treatment  Patient Details  Name: [name] [name] MRN: [id number] Date of Birth: 05/12/1971 Referring Provider (PT): Causey   Encounter Date: 10/15/2018  PT End of Session - 10/15/18 1149    Visit Number  6    Number of Visits  9    Date for PT Re-Evaluation  10/22/18    PT Start Time  1103    PT Stop Time  1146    PT Time Calculation (min)  43 min    Activity Tolerance  Patient tolerated treatment well    Behavior During Therapy  [facility] for tasks assessed/performed       Past Medical History:  Diagnosis Date  . Anxiety   . Breast cancer, left breast ([place]) 2000   Underwent lumpectomy, chemotherapy and radiation  . Facial paralysis/Bells palsy    left side  . Family history of lung cancer   . Family history of lymphoma   . Hypertension   . Neuropathy    IN FINGERS AND TOES   RECENT INFUSION OF CHEMO  . Personal history of breast cancer 05/06/2018  . Renal disorder    just after TIA acute kidney injury  . TIA (transient ischemic attack) 03/16/2017   [place] [facility]    Past Surgical History:  Procedure Laterality Date  . BREAST LUMPECTOMY WITH AXILLARY LYMPH NODE BIOPSY Left 2000   biopsy  . BREAST SURGERY     partial mastectomy with lymph node removal  . MASTECTOMY MODIFIED RADICAL Right 08/26/2018   Procedure: MASTECTOMY MODIFIED RADICAL;  Surgeon: Jovita Kussmaul, MD;  Location: [place];  Service: General;  Laterality: Right;  . MODIFIED MASTECTOMY Right 08/26/2018  . PORTACATH PLACEMENT Left 04/08/2018   Procedure: INSERTION PORT-A-CATH;  Surgeon: Jovita Kussmaul, MD;  Location: [facility];  Service: General;  Laterality: Left;  . TUBAL LIGATION Bilateral 2004  . WISDOM TOOTH EXTRACTION      There were no vitals filed for this visit.  Subjective Assessment - 10/15/18 1108    Subjective  My arm feels fine  but it is real tight at my wrist.     Pertinent History  2000 L partial mastectomy with axillary node dissection, completed chemo and radiation, 08/26/18- Ductal carcinoma in situ, high grade, with necrosis and calcifications. Prognostic indicators significant for: estrogen receptor, 90% positive and progesterone receptor, 40% positive, both with strong staining intensity. Proliferation marker Ki67 at 80%. HER2 amplified with ratios HER2/CEP17 SIGNALS 6.90 and average HER2 copies per cell 14.50, R mastectomy with 6 nodes removed, completed chemo and will begin radiation next week,, kadcyla    Patient Stated Goals  to be able to have full ROM of R shoulder    Currently in Pain?  No/denies    Pain Score  0-No pain            LYMPHEDEMA/ONCOLOGY QUESTIONNAIRE - 10/15/18 1140      Right Upper Extremity Lymphedema   15 cm Proximal to Olecranon Process  39 cm    Olecranon Process  31 cm    15 cm Proximal to Ulnar Styloid Process  29.7 cm    Just Proximal to Ulnar Styloid Process  20.5 cm    Across Hand at [employer]  20 cm    At [place] of 2nd Digit  7 cm      Left Upper Extremity Lymphedema   15  cm Proximal to Olecranon Process  40.3 cm    Olecranon Process  32.5 cm    15 cm Proximal to Ulnar Styloid Process  31.9 cm    Just Proximal to Ulnar Styloid Process  21 cm    Across Hand at [employer]  19.9 cm    At [place] of 2nd Digit  6.9 cm                [facility] Adult PT Treatment/Exercise - 10/15/18 0001      Manual Therapy   Manual Therapy  Manual Lymphatic Drainage (MLD)    Manual Lymphatic Drainage (MLD)  short neck, superficial and deep abdominals, left inguinal nodes and establishment of axillo inguinal pathway, LUE working proximal to distal then retracing all steps- instructed pt verbally throughout and did hand over hand demonstration for correct technique                  PT Long Term Goals - 10/15/18 1109      PT LONG TERM GOAL #1   Title  Pt will  demonstrate 170 degrees of shoulder abduction to allow pt to reach out to sides     Baseline  119, 10/02/18 - 170    Time  4    Period  Weeks    Status  Achieved      PT LONG TERM GOAL #2   Title  Pt will demonstrate 170 degrees of shoulder flexion to allow pt to reach overhead    Baseline  136, 10/02/18- 171    Time  4    Period  Weeks    Status  Achieved      PT LONG TERM GOAL #3   Title  Pt will report a 50% improvement in numbness and tingling in fingers and toes to allow improved comfort    Baseline  10/15/18- 50% improved, able to turn on lamps, use vaccum switch, open drawers    Time  4    Period  Weeks    Status  Achieved      PT LONG TERM GOAL #4   Title  Pt will be independent in a home exercise program for continued strengthening and stretching    Time  4    Period  Weeks    Status  On-going      PT LONG TERM GOAL #5   Title  Pt will be able to independently verbalize lymphedema risk reduction practices    Baseline  10/15/18- pt required some cueing with this    Status  Partially Met      Additional Long Term Goals   Additional Long Term Goals  Yes      PT LONG TERM GOAL #6   Title  Pt will obtain appropriate compression garments for long term management of LUE lymphedema    Time  4    Period  Weeks    Status  New    Target Date  11/12/18      PT LONG TERM GOAL #7   Title  Pt will be able to independently perform self MLD to decrease risk of cellulitis.     Time  4    Period  Weeks    Status  New    Target Date  11/12/18            Plan - 10/15/18 1150    Clinical Impression Statement  Pt has met most of her original goals for therapy but has now  developed lymphedema in her LUE (pt had SLNB in 2000 for L breast cancer). She has fullness at increased left forearm and wrist. Added goals today to address this. Pt would benefit from continued skilled PT services to instruct pt in proper self MLD technique and assist pt with obtaining appropriate  compression garments.     Rehab Potential  Good    Clinical Impairments Affecting Rehab Potential  previous hx of left breast cancer with radiation, pt beginning radiation next week for R breast cancer    PT Frequency  2x / week    PT Duration  4 weeks    PT Treatment/Interventions  ADLs/Self Care Home Management;Therapeutic exercise;Therapeutic activities;Patient/family education;Scar mobilization;Manual lymph drainage;Manual techniques;Passive range of motion;Compression bandaging;Vasopneumatic Device;Taping    PT Next Visit Plan  give handout on MLD for LUE, do not use interaxillary pathwway, review strength ABC program, see if she has made appt to get measured    PT Home Exercise Plan  post op breast exercises; stretches only from strength ABC program so far    Consulted and Agree with Plan of Care  Patient       Patient will benefit from skilled therapeutic intervention in order to improve the following deficits and impairments:  Pain, Increased fascial restricitons, Decreased scar mobility, Postural dysfunction, Decreased range of motion, Decreased strength, Impaired UE functional use, Decreased knowledge of precautions, Increased edema  Visit Diagnosis: Stiffness of right shoulder, not elsewhere classified - Plan: PT plan of care cert/re-cert  Muscle weakness (generalized) - Plan: PT plan of care cert/re-cert  Localized edema - Plan: PT plan of care cert/re-cert  Other disturbances of skin sensation - Plan: PT plan of care cert/re-cert     Problem List Patient Active Problem List   Diagnosis Date Noted  . Lymphedema of left arm 10/07/2018  . Cancer of right female breast ([place]) 08/26/2018  . Genetic testing 08/06/2018  . Morbid obesity with BMI of 40.0-44.9, adult ([place]) 07/09/2018  . Port-A-Cath in place 05/07/2018  . Personal history of breast cancer 05/06/2018  . Family history of lymphoma   . Family history of lung cancer   . Essential hypertension 04/01/2018  .  Pre-operative cardiovascular examination 04/01/2018  . Malignant neoplasm of upper-outer quadrant of right breast in female, estrogen receptor positive ([place]) 03/19/2018  . History of TIA (transient ischemic attack) 03/17/2017  . Fibroids 08/02/2014    Allyson Sabal [facility] 10/15/2018, 11:58 AM  [place], [place], [address] Phone: [phone]   Fax:  [phone]  Name: [name] [name] MRN: [id number] Date of Birth: 29-Nov-1971  Manus Gunning, PT 10/15/18 11:58 AM

## 2018-10-16 ENCOUNTER — Ambulatory Visit
Admission: RE | Admit: 2018-10-16 | Discharge: 2018-10-16 | Disposition: A | Discharge status: Home / Self Care | Payer: 59 | Source: Ambulatory Visit | Attending: Radiation Oncology | Admitting: Radiation Oncology

## 2018-10-16 ENCOUNTER — Ambulatory Visit: Payer: 59 | Admitting: Rehabilitation

## 2018-10-16 ENCOUNTER — Encounter: Payer: Self-pay | Admitting: Rehabilitation

## 2018-10-16 DIAGNOSIS — R208 Other disturbances of skin sensation: Secondary | ICD-10-CM

## 2018-10-16 DIAGNOSIS — M6281 Muscle weakness (generalized): Secondary | ICD-10-CM

## 2018-10-16 DIAGNOSIS — C50411 Malignant neoplasm of upper-outer quadrant of right female breast: Secondary | ICD-10-CM | POA: Diagnosis not present

## 2018-10-16 DIAGNOSIS — R600 Localized edema: Secondary | ICD-10-CM

## 2018-10-16 DIAGNOSIS — Z510 Encounter for antineoplastic radiation therapy: Secondary | ICD-10-CM | POA: Diagnosis not present

## 2018-10-16 DIAGNOSIS — M25611 Stiffness of right shoulder, not elsewhere classified: Secondary | ICD-10-CM

## 2018-10-16 DIAGNOSIS — Z170 Estrogen receptor positive status [ER+]: Secondary | ICD-10-CM | POA: Diagnosis not present

## 2018-10-16 NOTE — Patient Instructions (Signed)

## 2018-10-16 NOTE — Therapy (Addendum)
[facility] [place], [place], [address] Phone: [phone]   Fax:  [phone]  Physical Therapy Treatment  Patient Details  Name: [name] [name] MRN: [id number] Date of Birth: 19-Nov-1971 Referring Provider (PT): Causey   Encounter Date: 10/16/2018  PT End of Session - 10/16/18 0855    Visit Number  7    Number of Visits  9    Date for PT Re-Evaluation  10/22/18    PT Start Time  0800    PT Stop Time  0845    PT Time Calculation (min)  45 min    Activity Tolerance  Patient tolerated treatment well    Behavior During Therapy  [facility] for tasks assessed/performed       Past Medical History:  Diagnosis Date  . Anxiety   . Breast cancer, left breast ([place]) 2000   Underwent lumpectomy, chemotherapy and radiation  . Facial paralysis/Bells palsy    left side  . Family history of lung cancer   . Family history of lymphoma   . Hypertension   . Neuropathy    IN FINGERS AND TOES   RECENT INFUSION OF CHEMO  . Personal history of breast cancer 05/06/2018  . Renal disorder    just after TIA acute kidney injury  . TIA (transient ischemic attack) 03/16/2017   [facility]    Past Surgical History:  Procedure Laterality Date  . BREAST LUMPECTOMY WITH AXILLARY LYMPH NODE BIOPSY Left 2000   biopsy  . BREAST SURGERY     partial mastectomy with lymph node removal  . MASTECTOMY MODIFIED RADICAL Right 08/26/2018   Procedure: MASTECTOMY MODIFIED RADICAL;  Surgeon: Jovita Kussmaul, MD;  Location: [facility];  Service: General;  Laterality: Right;  . MODIFIED MASTECTOMY Right 08/26/2018  . PORTACATH PLACEMENT Left 04/08/2018   Procedure: INSERTION PORT-A-CATH;  Surgeon: Jovita Kussmaul, MD;  Location: [facility];  Service: General;  Laterality: Left;  . TUBAL LIGATION Bilateral 2004  . WISDOM TOOTH EXTRACTION      There were no vitals filed for this visit.  Subjective Assessment - 10/16/18 0759    Subjective  Today it is feeling  good.  I slept in the TG soft.  I can make a fist. I left Melissa some phone messages and she called my back yesterday but I didn't answer.      Pertinent History  2000 L partial mastectomy with axillary node dissection, completed chemo and radiation, 08/26/18- Ductal carcinoma in situ, high grade, with necrosis and calcifications. Prognostic indicators significant for: estrogen receptor, 90% positive and progesterone receptor, 40% positive, both with strong staining intensity. Proliferation marker Ki67 at 80%. HER2 amplified with ratios HER2/CEP17 SIGNALS 6.90 and average HER2 copies per cell 14.50, R mastectomy with 6 nodes removed, completed chemo and will begin radiation next week,, kadcyla    Patient Stated Goals  to be able to have full ROM of R shoulder    Currently in Pain?  No/denies                       [facility] Adult PT Treatment/Exercise - 10/16/18 0001      Manual Therapy   Edema Management  reviewed importance of movement    Manual Lymphatic Drainage (MLD)  education on self MLD with handout and modification of handout for no interaxillary pathway.  Performed breathing in supine due to being unable to perform correct diaphragmatic breathing in seated.  After practice and cueing.  Performing MLD sequence and techniques well.  Adela Lank training DVD for home viewing.               PT Education - 10/16/18 0854    Education Details  self MLD sequence    Person(s) Educated  Patient    Methods  Explanation;Demonstration;Tactile cues;Verbal cues;Handout    Comprehension  Verbalized understanding;Returned demonstration;Verbal cues required;Tactile cues required          PT Long Term Goals - 10/15/18 1109      PT LONG TERM GOAL #1   Title  Pt will demonstrate 170 degrees of shoulder abduction to allow pt to reach out to sides     Baseline  119, 10/02/18 - 170    Time  4    Period  Weeks    Status  Achieved      PT LONG TERM GOAL #2   Title  Pt will  demonstrate 170 degrees of shoulder flexion to allow pt to reach overhead    Baseline  136, 10/02/18- 171    Time  4    Period  Weeks    Status  Achieved      PT LONG TERM GOAL #3   Title  Pt will report a 50% improvement in numbness and tingling in fingers and toes to allow improved comfort    Baseline  10/15/18- 50% improved, able to turn on lamps, use vaccum switch, open drawers    Time  4    Period  Weeks    Status  Achieved      PT LONG TERM GOAL #4   Title  Pt will be independent in a home exercise program for continued strengthening and stretching    Time  4    Period  Weeks    Status  On-going      PT LONG TERM GOAL #5   Title  Pt will be able to independently verbalize lymphedema risk reduction practices    Baseline  10/15/18- pt required some cueing with this    Status  Partially Met      Additional Long Term Goals   Additional Long Term Goals  Yes      PT LONG TERM GOAL #6   Title  Pt will obtain appropriate compression garments for long term management of LUE lymphedema    Time  4    Period  Weeks    Status  New    Target Date  11/12/18      PT LONG TERM GOAL #7   Title  Pt will be able to independently perform self MLD to decrease risk of cellulitis.     Time  4    Period  Weeks    Status  New    Target Date  11/12/18            Plan - 10/16/18 0855    Clinical Impression Statement  Pt was educated on MLD sequence for the Lt UE with good performance and knowledge of techiniqes.  No axillary pathway used.  She will be measured for garments at 2pm on Monday and will check back in with [place] on Wed for check of MLD techniques.      PT Frequency  2x / week    PT Duration  4 weeks    PT Treatment/Interventions  ADLs/Self Care Home Management;Therapeutic exercise;Therapeutic activities;Patient/family education;Scar mobilization;Manual lymph drainage;Manual techniques;Passive range of motion;Compression bandaging;Vasopneumatic Device;Taping    PT Next Visit Plan   how was  MLD?  get measured? need to teach review ABC? any more visits?       Patient will benefit from skilled therapeutic intervention in order to improve the following deficits and impairments:  Pain, Increased fascial restricitons, Decreased scar mobility, Postural dysfunction, Decreased range of motion, Decreased strength, Impaired UE functional use, Decreased knowledge of precautions, Increased edema  Visit Diagnosis: Stiffness of right shoulder, not elsewhere classified  Muscle weakness (generalized)  Localized edema  Other disturbances of skin sensation     Problem List Patient Active Problem List   Diagnosis Date Noted  . Lymphedema of left arm 10/07/2018  . Cancer of right female breast ([place]) 08/26/2018  . Genetic testing 08/06/2018  . Morbid obesity with BMI of 40.0-44.9, adult ([place]) 07/09/2018  . Port-A-Cath in place 05/07/2018  . Personal history of breast cancer 05/06/2018  . Family history of lymphoma   . Family history of lung cancer   . Essential hypertension 04/01/2018  . Pre-operative cardiovascular examination 04/01/2018  . Malignant neoplasm of upper-outer quadrant of right breast in female, estrogen receptor positive ([place]) 03/19/2018  . History of TIA (transient ischemic attack) 03/17/2017  . Fibroids 08/02/2014    Shan Levans, PT 10/16/2018, 9:02 AM  [facility] [place], [place], [address] Phone: [phone]   Fax:  [phone]  Name: [name] [name] MRN: [id number] Date of Birth: 1971-01-06   PHYSICAL THERAPY DISCHARGE SUMMARY  Visits from Start of Care: 7  Current functional level related to goals / functional outcomes: See above   Remaining deficits: Chronic lymphedema   Education / Equipment: Self MLD, ROM HEP, garments Plan: Patient agrees to discharge.  Patient goals were partially met. Patient is being discharged due to not returning since the last visit.  ?????     Shan Levans, PT

## 2018-10-19 ENCOUNTER — Ambulatory Visit
Admission: RE | Admit: 2018-10-19 | Discharge: 2018-10-19 | Disposition: A | Discharge status: Home / Self Care | Payer: 59 | Source: Ambulatory Visit | Attending: Radiation Oncology | Admitting: Radiation Oncology

## 2018-10-19 DIAGNOSIS — I10 Essential (primary) hypertension: Secondary | ICD-10-CM | POA: Diagnosis not present

## 2018-10-19 DIAGNOSIS — C50811 Malignant neoplasm of overlapping sites of right female breast: Secondary | ICD-10-CM | POA: Diagnosis not present

## 2018-10-19 DIAGNOSIS — C50411 Malignant neoplasm of upper-outer quadrant of right female breast: Secondary | ICD-10-CM | POA: Diagnosis not present

## 2018-10-19 DIAGNOSIS — Z5112 Encounter for antineoplastic immunotherapy: Secondary | ICD-10-CM | POA: Diagnosis not present

## 2018-10-19 DIAGNOSIS — Z510 Encounter for antineoplastic radiation therapy: Secondary | ICD-10-CM | POA: Diagnosis not present

## 2018-10-19 DIAGNOSIS — Z170 Estrogen receptor positive status [ER+]: Secondary | ICD-10-CM | POA: Diagnosis not present

## 2018-10-20 ENCOUNTER — Ambulatory Visit
Admission: RE | Admit: 2018-10-20 | Discharge: 2018-10-20 | Disposition: A | Discharge status: Home / Self Care | Payer: 59 | Source: Ambulatory Visit | Attending: Radiation Oncology | Admitting: Radiation Oncology

## 2018-10-20 DIAGNOSIS — Z510 Encounter for antineoplastic radiation therapy: Secondary | ICD-10-CM | POA: Diagnosis not present

## 2018-10-20 DIAGNOSIS — C50411 Malignant neoplasm of upper-outer quadrant of right female breast: Secondary | ICD-10-CM | POA: Diagnosis not present

## 2018-10-21 ENCOUNTER — Telehealth: Payer: Self-pay | Admitting: Rehabilitation

## 2018-10-21 ENCOUNTER — Ambulatory Visit: Payer: 59 | Admitting: Rehabilitation

## 2018-10-21 ENCOUNTER — Ambulatory Visit
Admission: RE | Admit: 2018-10-21 | Discharge: 2018-10-21 | Disposition: A | Discharge status: Home / Self Care | Payer: 59 | Source: Ambulatory Visit | Attending: Radiation Oncology | Admitting: Radiation Oncology

## 2018-10-21 DIAGNOSIS — Z510 Encounter for antineoplastic radiation therapy: Secondary | ICD-10-CM | POA: Diagnosis not present

## 2018-10-21 DIAGNOSIS — C50411 Malignant neoplasm of upper-outer quadrant of right female breast: Secondary | ICD-10-CM | POA: Diagnosis not present

## 2018-10-21 NOTE — Telephone Encounter (Signed)
LM for pt to call if she needs to schedule any more PT appointments

## 2018-10-22 ENCOUNTER — Inpatient Hospital Stay: Payer: 59 | Attending: Oncology

## 2018-10-22 ENCOUNTER — Inpatient Hospital Stay: Payer: 59

## 2018-10-22 ENCOUNTER — Inpatient Hospital Stay (HOSPITAL_BASED_OUTPATIENT_CLINIC_OR_DEPARTMENT_OTHER): Payer: 59 | Admitting: Adult Health

## 2018-10-22 ENCOUNTER — Ambulatory Visit
Admission: RE | Admit: 2018-10-22 | Discharge: 2018-10-22 | Disposition: A | Discharge status: Home / Self Care | Payer: 59 | Source: Ambulatory Visit | Attending: Radiation Oncology | Admitting: Radiation Oncology

## 2018-10-22 ENCOUNTER — Telehealth: Payer: Self-pay | Admitting: Adult Health

## 2018-10-22 ENCOUNTER — Encounter: Payer: Self-pay | Admitting: Adult Health

## 2018-10-22 VITALS — BP 152/75 | HR 64 | Temp 98.5°F | Resp 18 | Ht 59.0 in | Wt 213.7 lb

## 2018-10-22 DIAGNOSIS — C50411 Malignant neoplasm of upper-outer quadrant of right female breast: Secondary | ICD-10-CM | POA: Diagnosis not present

## 2018-10-22 DIAGNOSIS — Z801 Family history of malignant neoplasm of trachea, bronchus and lung: Secondary | ICD-10-CM

## 2018-10-22 DIAGNOSIS — Z170 Estrogen receptor positive status [ER+]: Secondary | ICD-10-CM | POA: Diagnosis not present

## 2018-10-22 DIAGNOSIS — Z510 Encounter for antineoplastic radiation therapy: Secondary | ICD-10-CM | POA: Diagnosis not present

## 2018-10-22 DIAGNOSIS — I10 Essential (primary) hypertension: Secondary | ICD-10-CM | POA: Diagnosis not present

## 2018-10-22 DIAGNOSIS — Z5112 Encounter for antineoplastic immunotherapy: Secondary | ICD-10-CM | POA: Diagnosis not present

## 2018-10-22 DIAGNOSIS — Z807 Family history of other malignant neoplasms of lymphoid, hematopoietic and related tissues: Secondary | ICD-10-CM

## 2018-10-22 DIAGNOSIS — Z9011 Acquired absence of right breast and nipple: Secondary | ICD-10-CM

## 2018-10-22 DIAGNOSIS — C50811 Malignant neoplasm of overlapping sites of right female breast: Secondary | ICD-10-CM | POA: Insufficient documentation

## 2018-10-22 DIAGNOSIS — Z95828 Presence of other vascular implants and grafts: Secondary | ICD-10-CM

## 2018-10-22 LAB — CBC WITH DIFFERENTIAL/PLATELET
Abs Immature Granulocytes: 0.02 10*3/uL (ref 0.00–0.07)
Basophils Absolute: 0 10*3/uL (ref 0.0–0.1)
Basophils Relative: 1 %
Eosinophils Absolute: 0.1 10*3/uL (ref 0.0–0.5)
Eosinophils Relative: 2 %
HCT: 34.1 % — ABNORMAL LOW (ref 36.0–46.0)
Hemoglobin: 10.9 g/dL — ABNORMAL LOW (ref 12.0–15.0)
Immature Granulocytes: 1 %
Lymphocytes Relative: 15 %
Lymphs Abs: 0.6 10*3/uL — ABNORMAL LOW (ref 0.7–4.0)
MCH: 28.2 pg (ref 26.0–34.0)
MCHC: 32 g/dL (ref 30.0–36.0)
MCV: 88.3 fL (ref 80.0–100.0)
Monocytes Absolute: 0.7 10*3/uL (ref 0.1–1.0)
Monocytes Relative: 17 %
Neutro Abs: 2.7 10*3/uL (ref 1.7–7.7)
Neutrophils Relative %: 64 %
Platelets: 171 10*3/uL (ref 150–400)
RBC: 3.86 MIL/uL — ABNORMAL LOW (ref 3.87–5.11)
RDW: 14.6 % (ref 11.5–15.5)
WBC: 4.2 10*3/uL (ref 4.0–10.5)
nRBC: 0 % (ref 0.0–0.2)

## 2018-10-22 LAB — COMPREHENSIVE METABOLIC PANEL
ALT: 16 U/L (ref 0–44)
AST: 17 U/L (ref 15–41)
Albumin: 3.6 g/dL (ref 3.5–5.0)
Alkaline Phosphatase: 109 U/L (ref 38–126)
Anion gap: 8 (ref 5–15)
BUN: 25 mg/dL — ABNORMAL HIGH (ref 6–20)
CO2: 25 mmol/L (ref 22–32)
Calcium: 10.9 mg/dL — ABNORMAL HIGH (ref 8.9–10.3)
Chloride: 105 mmol/L (ref 98–111)
Creatinine, Ser: 1.41 mg/dL — ABNORMAL HIGH (ref 0.44–1.00)
GFR calc Af Amer: 50 mL/min — ABNORMAL LOW (ref >60)
GFR calc non Af Amer: 44 mL/min — ABNORMAL LOW (ref >60)
Glucose, Bld: 86 mg/dL (ref 70–99)
Potassium: 4.1 mmol/L (ref 3.5–5.1)
Sodium: 138 mmol/L (ref 135–145)
Total Bilirubin: 0.3 mg/dL (ref 0.3–1.2)
Total Protein: 7.2 g/dL (ref 6.5–8.1)

## 2018-10-22 MED ORDER — SODIUM CHLORIDE 0.9 % IV SOLN
Freq: Once | INTRAVENOUS | Status: AC
  Administered 2018-10-22: 12:00:00 via INTRAVENOUS
  Filled 2018-10-22: qty 250

## 2018-10-22 MED ORDER — DIPHENHYDRAMINE HCL 25 MG PO CAPS
ORAL_CAPSULE | ORAL | Status: AC
  Filled 2018-10-22: qty 1

## 2018-10-22 MED ORDER — SODIUM CHLORIDE 0.9% FLUSH
10.0000 mL | INTRAVENOUS | Status: DC | PRN
  Administered 2018-10-22: 10 mL
  Filled 2018-10-22: qty 10

## 2018-10-22 MED ORDER — ACETAMINOPHEN 325 MG PO TABS
650.0000 mg | ORAL_TABLET | Freq: Once | ORAL | Status: AC
  Administered 2018-10-22: 650 mg via ORAL

## 2018-10-22 MED ORDER — TRASTUZUMAB CHEMO 150 MG IV SOLR
6.0000 mg/kg | Freq: Once | INTRAVENOUS | Status: AC
  Administered 2018-10-22: 588 mg via INTRAVENOUS
  Filled 2018-10-22: qty 28

## 2018-10-22 MED ORDER — ACETAMINOPHEN 325 MG PO TABS
ORAL_TABLET | ORAL | Status: AC
  Filled 2018-10-22: qty 2

## 2018-10-22 MED ORDER — DIPHENHYDRAMINE HCL 25 MG PO CAPS
25.0000 mg | ORAL_CAPSULE | Freq: Once | ORAL | Status: AC
  Administered 2018-10-22: 25 mg via ORAL

## 2018-10-22 MED ORDER — HEPARIN SOD (PORK) LOCK FLUSH 100 UNIT/ML IV SOLN
500.0000 [IU] | Freq: Once | INTRAVENOUS | Status: AC | PRN
  Administered 2018-10-22: 500 [IU]
  Filled 2018-10-22: qty 5

## 2018-10-22 MED ORDER — SODIUM CHLORIDE 0.9% FLUSH
10.0000 mL | Freq: Once | INTRAVENOUS | Status: AC
  Administered 2018-10-22: 10 mL
  Filled 2018-10-22: qty 10

## 2018-10-22 NOTE — Telephone Encounter (Signed)
No los °

## 2018-10-22 NOTE — Progress Notes (Signed)
Dr. Jana Hakim it is ok to treat with ECHO from 06/2018

## 2018-10-22 NOTE — Patient Instructions (Signed)
[facility] Discharge Instructions for Patients Receiving Chemotherapy  Today you received the following chemotherapy agents Herceptin  To help prevent nausea and vomiting after your treatment, we encourage you to take your nausea medication as directed   If you develop nausea and vomiting that is not controlled by your nausea medication, call the clinic.   BELOW ARE SYMPTOMS THAT SHOULD BE REPORTED IMMEDIATELY:  *FEVER GREATER THAN 100.5 F  *CHILLS WITH OR WITHOUT FEVER  NAUSEA AND VOMITING THAT IS NOT CONTROLLED WITH YOUR NAUSEA MEDICATION  *UNUSUAL SHORTNESS OF BREATH  *UNUSUAL BRUISING OR BLEEDING  TENDERNESS IN MOUTH AND THROAT WITH OR WITHOUT PRESENCE OF ULCERS  *URINARY PROBLEMS  *BOWEL PROBLEMS  UNUSUAL RASH Items with * indicate a potential emergency and should be followed up as soon as possible.  Feel free to call the clinic should you have any questions or concerns. The clinic phone number is [phone].  Please show the CHEMO ALERT CARD at check-in to the Emergency Department and triage nurse.   

## 2018-10-22 NOTE — Progress Notes (Signed)
[place]  [phone] [phone]     ID: [name] [name] DOB: [birth year]  MR#: [id number]  JYN#:[id number]  Patient Care Team: Marda Stalker, PA-C as PCP - General (Family Medicine) Jerline Pain, MD as PCP - Cardiology (Cardiology) Magrinat, Virgie Dad, MD as Consulting Physician (Oncology) Loreta Ave, MD as Referring Physician (Internal Medicine) Donato Heinz, MD as Consulting Physician (Nephrology) Kyung Rudd, MD as Consulting Physician (Radiation Oncology) Jovita Kussmaul, MD as Consulting Physician (General Surgery) Larey Dresser, MD as Consulting Physician (Cardiology) OTHER MD:  CHIEF COMPLAINT: Triple positive breast cancer  CURRENT TREATMENT: Maintenance Trastuzumab/adjuvant radiation   HISTORY OF CURRENT ILLNESS: From the original intake note:  [name] [name] palpated a lump on 01/15/2018. She felt soreness and shooting pain under her arm and in the right breast. She underwent bilateral diagnostic mammography with tomography and right breast ultrasonography at [facility] on 03/12/2018 showing: breast density category C. The is architectural distortion at the 11 o'clock position. There is also an oval mass in the right breast anterior depth. Examination of the right axilla showed enlarged lymph nodes. Ultrasonography showed a 4.6 cm mass in the right breast upper outer quadrant posterior depth. An additional 1.2 cm oval mass in the right breast 12 o'clock middle depth. The lymph nodes in the right axillary are highly suggestive of malignancy.   Accordingly on 03/16/2018 she proceeded to biopsy of the right breast area in question. The pathology from this procedure showed ([id number]): At both the 11 and 12 o'clock positions: Invasive ductal carcinoma grade II. Ductal carcinoma in situ, high grade, with necrosis and calcifications. Prognostic indicators significant for: estrogen receptor, 90% positive and progesterone receptor, 40%  positive, both with strong staining intensity. Proliferation marker Ki67 at 80%. HER2 amplified with ratios HER2/CEP17 SIGNALS 6.90 and average HER2 copies per cell 14.50  The patient's subsequent history is as detailed below.  INTERVAL HISTORY: [name] returns today for follow-up and treatment of her triple positive breast cancer. She is doing well today.  She is getting daily radiation and she is feeling well.    REVIEW OF SYSTEMS: [name] is doing well today. She notes some nail changes that have been present sicne chemo and wants to know if they will improve.  She notes that her neuropathy is improving.  She is going to physical therapy and says that this is helping.  She is returning to work on Monday at [employer].  She has not had an echo since July.  Her last echo was with Dr. Haroldine Laws.  Otherwise [name] is doing well and a detailed ROS was non contributory.    PAST MEDICAL HISTORY: Past Medical History:  Diagnosis Date  . Anxiety   . Breast cancer, left breast ([place]) 2000   Underwent lumpectomy, chemotherapy and radiation  . Facial paralysis/Bells palsy    left side  . Family history of lung cancer   . Family history of lymphoma   . Hypertension   . Neuropathy    IN FINGERS AND TOES   RECENT INFUSION OF CHEMO  . Personal history of breast cancer 05/06/2018  . Renal disorder    just after TIA acute kidney injury  . TIA (transient ischemic attack) 03/16/2017   [place] [facility]   The patient has a previous history of left breast cancer diagnosed in 2000. She was seen by [facility] in [place], [place] under Dr. Loreta Ave. According to the patient, her left breast cancer was stage II with no  lymph node involvement (likely T2N0). She had chemotherapy every 3 weeks for about 6 months. She did not takes any "red drugs." She also did not take anti-estrogens (likely ER/PR negative).   TIA in 2018. She saw a neurologist as a precaution. She denies any clotting issues.    PAST SURGICAL HISTORY: Past Surgical History:  Procedure Laterality Date  . BREAST LUMPECTOMY WITH AXILLARY LYMPH NODE BIOPSY Left 2000   biopsy  . BREAST SURGERY     partial mastectomy with lymph node removal  . MASTECTOMY MODIFIED RADICAL Right 08/26/2018   Procedure: MASTECTOMY MODIFIED RADICAL;  Surgeon: Jovita Kussmaul, MD;  Location: [place];  Service: General;  Laterality: Right;  . MODIFIED MASTECTOMY Right 08/26/2018  . PORTACATH PLACEMENT Left 04/08/2018   Procedure: INSERTION PORT-A-CATH;  Surgeon: Jovita Kussmaul, MD;  Location: [place] [place];  Service: General;  Laterality: Left;  . TUBAL LIGATION Bilateral 2004  . WISDOM TOOTH EXTRACTION      FAMILY HISTORY Family History  Problem Relation Age of Onset  . Hypertension Mother   . Hypertension Father   . Diverticulosis Father   . Diabetes Father   . Lung cancer Father 20       hx smoking  . Hypertension Maternal Grandmother   . CAD Maternal Grandmother   . Alzheimer's disease Maternal Grandmother        died at 5  . Hypertension Paternal Grandmother   . CAD Paternal Grandmother   . Stroke Paternal Grandfather   . Sickle cell trait Paternal Grandfather   . Pneumonia Maternal Aunt        died at a young adult  . Lymphoma Paternal Aunt 46  The patient's father is alive at age 81 and had a history of lung cancer. The patient's mother is alive at age 14. The patient has 1 brother and no sisters. She denies a family history of breast or ovarian cancer.    GYNECOLOGIC HISTORY:  No LMP recorded. (Menstrual status: Perimenopausal). Menarche: 47 years old Age at first live birth: 47 years old She is GXP3. She is no longer having periods, which were irregular and light. Her LMP was  January 2019 and  December 2018. She is not having hot flashes. The patient used oral contraceptive from [phone] and the Depo-provera shot from [phone] with no complications. She never used HRT.    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 Her husband, [name], is a Administrator. The patient's son, Shea Stakes age 38, works at [employer] in [place]. The patient's daughters, Lilia Pro age 80, and Levonne Spiller age 30, are students. The patient's adopted son, Vonna Kotyk age 43, is also a Ship broker.      ADVANCED DIRECTIVES:    HEALTH MAINTENANCE: Social History   Tobacco Use  . Smoking status: Never Smoker  . Smokeless tobacco: Never Used  Substance Use Topics  . Alcohol use: No  . Drug use: No     Colonoscopy:   PAP: 2015  Bone density:   Allergies  Allergen Reactions  . Ace Inhibitors Swelling    Swelling of lips and face  . Lisinopril Swelling    Swelling of lips, face  . Amlodipine Swelling    Leg swelling  . Adhesive [Tape] Itching and Rash    Please use paper tape  . Latex Itching and Rash    Current Outpatient Medications  Medication Sig Dispense Refill  . BYSTOLIC 20 MG TABS TAKE 1 TABLET (20 MG TOTAL) BY  MOUTH DAILY. (Patient taking differently: Take 20 mg by mouth daily. ) 30 tablet 6  . cloNIDine (CATAPRES) 0.2 MG tablet Take 0.2 mg by mouth daily as needed (BP above 150).     . Cyanocobalamin (VITAMIN B12) 500 MCG TABS Take 500 mcg by mouth daily.     . diphenoxylate-atropine (LOMOTIL) 2.5-0.025 MG tablet Take 1 tablet by mouth 4 (four) times daily as needed for diarrhea or loose stools. 30 tablet 1  . ferrous sulfate 325 (65 FE) MG tablet Take 1 tablet (325 mg total) by mouth daily. 30 tablet 0  . gabapentin (NEURONTIN) 100 MG capsule Take 1-3 capsules (100-300 mg total) by mouth at bedtime. Start taking '100mg'$  at night and increase by '100mg'$  per night to '300mg'$  as tolerated 90 capsule 0  . HYDROcodone-acetaminophen (NORCO/VICODIN) 5-325 MG tablet Take 1-2 tablets by mouth every 6 (six) hours as needed for moderate pain. 15 tablet 0  . lidocaine-prilocaine (EMLA) cream Apply 1 application topically as needed.    . methocarbamol (ROBAXIN) 500 MG tablet Take 1 tablet (500 mg total) by mouth every 6  (six) hours as needed for muscle spasms. 20 tablet 1  . Multiple Vitamin (MULITIVITAMIN WITH MINERALS) TABS Take 1 tablet by mouth daily.      Marland Kitchen spironolactone (ALDACTONE) 50 MG tablet TAKE 1 TABLET (50 MG TOTAL) BY MOUTH DAILY. 30 tablet 6   No current facility-administered medications for this visit.     OBJECTIVE:   Vitals:   10/22/18 1039  BP: (!) 152/75  Pulse: 64  Resp: 18  Temp: 98.5 F (36.9 C)  SpO2: 98%     Body mass index is 43.16 kg/m.   Wt Readings from Last 3 Encounters:  10/22/18 213 lb 11.2 oz (96.9 kg)  10/01/18 206 lb 4 oz (93.6 kg)  09/15/18 209 lb 6.4 oz (95 kg)  ECOG FS:1 - Symptomatic but completely ambulatory GENERAL: Patient is a well appearing female in no acute distress HEENT:  Sclerae anicteric.  Oropharynx clear and moist. No ulcerations or evidence of oropharyngeal candidiasis. Neck is supple.  NODES:  No cervical, supraclavicular, or axillary lymphadenopathy palpated.  BREAST EXAM:  Deferred (undergoing radiation) LUNGS:  Clear to auscultation bilaterally.  No wheezes or rhonchi. HEART:  Regular rate and rhythm. No murmur appreciated. ABDOMEN:  Soft, nontender.  Positive, normoactive bowel sounds. No organomegaly palpated. MSK:  No focal spinal tenderness to palpation. Full range of motion bilaterally in the upper extremities. EXTREMITIES:  No peripheral edema.   SKIN:  Clear with no obvious rashes or skin changes. Darkened nails, and ridging of the nails NEURO:  Nonfocal. Well oriented.  Appropriate affect.     LAB RESULTS:  CMP     Component Value Date/Time   NA 138 10/22/2018 1004   K 4.1 10/22/2018 1004   CL 105 10/22/2018 1004   CO2 25 10/22/2018 1004   GLUCOSE 86 10/22/2018 1004   BUN 25 (H) 10/22/2018 1004   CREATININE 1.41 (H) 10/22/2018 1004   CREATININE 1.29 (H) 03/25/2018 1129   CALCIUM 10.9 (H) 10/22/2018 1004   CALCIUM 10.2 06/25/2018 1425   PROT 7.2 10/22/2018 1004   ALBUMIN 3.6 10/22/2018 1004   AST 17 10/22/2018 1004    AST 22 03/25/2018 1129   ALT 16 10/22/2018 1004   ALT 29 03/25/2018 1129   ALKPHOS 109 10/22/2018 1004   BILITOT 0.3 10/22/2018 1004   BILITOT 0.3 03/25/2018 1129   GFRNONAA 44 (L) 10/22/2018 1004   GFRNONAA 49 (  L) 03/25/2018 1129   GFRAA 50 (L) 10/22/2018 1004   GFRAA 57 (L) 03/25/2018 1129    No results found for: TOTALPROTELP, ALBUMINELP, A1GS, A2GS, BETS, BETA2SER, GAMS, MSPIKE, SPEI  No results found for: KPAFRELGTCHN, LAMBDASER, KAPLAMBRATIO  Lab Results  Component Value Date   WBC 4.2 10/22/2018   NEUTROABS 2.7 10/22/2018   HGB 10.9 (L) 10/22/2018   HCT 34.1 (L) 10/22/2018   MCV 88.3 10/22/2018   PLT 171 10/22/2018    '@LASTCHEMISTRY'$ @  No results found for: LABCA2  No components found for: DVVOHY073  No results for input(s): INR in the last 168 hours.  No results found for: LABCA2  No results found for: XTG626  No results found for: RSW546  No results found for: EVO350  No results found for: CA2729  No components found for: HGQUANT  No results found for: CEA1 / No results found for: CEA1   No results found for: AFPTUMOR  No results found for: [place]  No results found for: PSA1  Appointment on 10/22/2018  Component Date Value Ref Range Status  . WBC 10/22/2018 4.2  4.0 - 10.5 K/uL Final  . RBC 10/22/2018 3.86* 3.87 - 5.11 MIL/uL Final  . Hemoglobin 10/22/2018 10.9* 12.0 - 15.0 g/dL Final  . HCT 10/22/2018 34.1* 36.0 - 46.0 % Final  . MCV 10/22/2018 88.3  80.0 - 100.0 fL Final  . MCH 10/22/2018 28.2  26.0 - 34.0 pg Final  . MCHC 10/22/2018 32.0  30.0 - 36.0 g/dL Final  . RDW 10/22/2018 14.6  11.5 - 15.5 % Final  . Platelets 10/22/2018 171  150 - 400 K/uL Final  . nRBC 10/22/2018 0.0  0.0 - 0.2 % Final  . Neutrophils Relative % 10/22/2018 64  % Final  . Neutro Abs 10/22/2018 2.7  1.7 - 7.7 K/uL Final  . Lymphocytes Relative 10/22/2018 15  % Final  . Lymphs Abs 10/22/2018 0.6* 0.7 - 4.0 K/uL Final  . Monocytes Relative 10/22/2018 17  %  Final  . Monocytes Absolute 10/22/2018 0.7  0.1 - 1.0 K/uL Final  . Eosinophils Relative 10/22/2018 2  % Final  . Eosinophils Absolute 10/22/2018 0.1  0.0 - 0.5 K/uL Final  . Basophils Relative 10/22/2018 1  % Final  . Basophils Absolute 10/22/2018 0.0  0.0 - 0.1 K/uL Final  . Immature Granulocytes 10/22/2018 1  % Final  . Abs Immature Granulocytes 10/22/2018 0.02  0.00 - 0.07 K/uL Final   Performed at [facility] [address] [place], [place] [address]  . Sodium 10/22/2018 138  135 - 145 mmol/L Final  . Potassium 10/22/2018 4.1  3.5 - 5.1 mmol/L Final  . Chloride 10/22/2018 105  98 - 111 mmol/L Final  . CO2 10/22/2018 25  22 - 32 mmol/L Final  . Glucose, Bld 10/22/2018 86  70 - 99 mg/dL Final  . BUN 10/22/2018 25* 6 - 20 mg/dL Final  . Creatinine, Ser 10/22/2018 1.41* 0.44 - 1.00 mg/dL Final  . Calcium 10/22/2018 10.9* 8.9 - 10.3 mg/dL Final  . Total Protein 10/22/2018 7.2  6.5 - 8.1 g/dL Final  . Albumin 10/22/2018 3.6  3.5 - 5.0 g/dL Final  . AST 10/22/2018 17  15 - 41 U/L Final  . ALT 10/22/2018 16  0 - 44 U/L Final  . Alkaline Phosphatase 10/22/2018 109  38 - 126 U/L Final  . Total Bilirubin 10/22/2018 0.3  0.3 - 1.2 mg/dL Final  . GFR calc non Af Wyvonnia Lora 10/22/2018  44* >60 mL/min Final  . GFR calc Af Amer 10/22/2018 50* >60 mL/min Final   Comment: (NOTE) The eGFR has been calculated using the CKD EPI equation. This calculation has not been validated in all clinical situations. eGFR's persistently <60 mL/min signify possible Chronic Kidney Disease.   Georgiann Hahn gap 10/22/2018 8  5 - 15 Final   Performed at [facility], [place] [address] [place], [place] [address]    (this displays the last labs from the last 3 days)  No results found for: TOTALPROTELP, ALBUMINELP, A1GS, A2GS, BETS, BETA2SER, GAMS, MSPIKE, SPEI (this displays SPEP labs)  No results found for: KPAFRELGTCHN, LAMBDASER, KAPLAMBRATIO (kappa/lambda light  chains)  No results found for: HGBA, HGBA2QUANT, HGBFQUANT, HGBSQUAN (Hemoglobinopathy evaluation)   No results found for: LDH  No results found for: IRON, TIBC, IRONPCTSAT (Iron and TIBC)  No results found for: FERRITIN  Urinalysis    Component Value Date/Time   COLORURINE STRAW (A) 03/17/2017 0600   APPEARANCEUR CLEAR (A) 03/17/2017 0600   LABSPEC 1.031 (H) 03/17/2017 0600   PHURINE 5.0 03/17/2017 0600   GLUCOSEU NEGATIVE 03/17/2017 0600   HGBUR NEGATIVE 03/17/2017 0600   BILIRUBINUR NEGATIVE 03/17/2017 0600   KETONESUR NEGATIVE 03/17/2017 0600   PROTEINUR NEGATIVE 03/17/2017 0600   UROBILINOGEN 0.2 11/29/2011 1656   NITRITE NEGATIVE 03/17/2017 0600   LEUKOCYTESUR NEGATIVE 03/17/2017 0600     STUDIES: No results found.  ELIGIBLE FOR AVAILABLE RESEARCH PROTOCOL: no  ASSESSMENT: 47 y.o. [place], [place] woman  (1) status post left lumpectomy in 2000 for a (?) T2N0 breast cancer,  (a) status post adjuvant chemotherapy  (b) status post adjuvant radiation  (c) did not receive antiestrogens  (2) genetics testing 05/06/2018, results pending  (3) status post right breast biopsy 03/16/2018 for a clinical mT2-3 N2, anatomic stage III invasive ductal carcinoma, grade 2, triple positive, with an MIB-1 of 80%.  (a) bone scan and chest CT scan negative for metastases except for a thoracic spine lytic lesion of concern  (4) neoadjuvant chemotherapy consisting of carboplatin, docetaxel, trastuzumab and Pertuzumab every 3 weeks x 6 starting 04/16/2018  (a) pertuzumab held beginning with cycle 2 because of diarrhea  (5) trastuzumab to be continued to a total of 1 year  (a) echocardiogram on 06/29/2018 showed an ejection fraction in the 65-70% range  (6) right mastectomy and sentinel lymph node sampling on 08/26/2018 found a residual 2.5cm invasive ductal carcinoma, with 2 of 5 sentinel lymph nodes positive, ypT2, ypN1a; margins were clear  (7) adjuvant radiation started  10/02/2018  (8) antiestrogens to start at the completion of local treatment  PLAN: [name] is doing well today.  She is tolerating radiation.  She denies any issues today.  She will proceed with Trastuzumab.  She is tolerating this well.    At her last appointment, I sent Kevan Rosebush a request to get patient in for repeat echo and appointment.  [name] has unfortunately not been seen.  She has no cardiac issues today.  I reviewed with Dr. Jana Hakim and she will proceed with Trastuzumab today.  He requested I send my note straight to Dr. Aundra Dubin which is what I will do.    [name] will continue radiation.  We drew an [facility] and estradiol and they are consistent with her having gone through menopause.  We reviewed the process and that bone health is important.  We will recheck her PTH with her next set of labs.  Her calcium remains mildly elevated.  [name]  will return every 3 weeks for labs and Trastuzumab.  She will see Dr. Jana Hakim towards the end of radiation.   She knows to call for any questions or concerns prior to her next appointment with [place].    A total of (20) minutes of face-to-face time was spent with this patient with greater than 50% of that time in counseling and care-coordination.  Wilber Bihari, NP  10/22/18 10:53 AM Medical Oncology and Hematology [facility] [address] [place], [place] [address] Tel. [phone]    Fax. [phone]

## 2018-10-23 ENCOUNTER — Telehealth (HOSPITAL_COMMUNITY): Payer: Self-pay | Admitting: Vascular Surgery

## 2018-10-23 ENCOUNTER — Ambulatory Visit
Admission: RE | Admit: 2018-10-23 | Discharge: 2018-10-23 | Disposition: A | Discharge status: Home / Self Care | Payer: 59 | Source: Ambulatory Visit | Attending: Radiation Oncology | Admitting: Radiation Oncology

## 2018-10-23 DIAGNOSIS — C50411 Malignant neoplasm of upper-outer quadrant of right female breast: Secondary | ICD-10-CM | POA: Diagnosis not present

## 2018-10-23 DIAGNOSIS — Z510 Encounter for antineoplastic radiation therapy: Secondary | ICD-10-CM | POA: Diagnosis not present

## 2018-10-23 NOTE — Progress Notes (Signed)
This is not a Dr.McLean patient this is Dr.Bensimhons patient. Kamilah left VM for pt to call back this AM. Please make sure we are checking to see which provider the patient is followed by.

## 2018-10-23 NOTE — Telephone Encounter (Signed)
Left pt message to make brst appt w/ echo/ ASAP, PT CAN BE OVERBOOKED PER db

## 2018-10-25 ENCOUNTER — Ambulatory Visit
Admission: RE | Admit: 2018-10-25 | Discharge: 2018-10-25 | Disposition: A | Discharge status: Home / Self Care | Payer: 59 | Source: Ambulatory Visit | Attending: Radiation Oncology | Admitting: Radiation Oncology

## 2018-10-25 DIAGNOSIS — Z510 Encounter for antineoplastic radiation therapy: Secondary | ICD-10-CM | POA: Diagnosis not present

## 2018-10-25 DIAGNOSIS — C50411 Malignant neoplasm of upper-outer quadrant of right female breast: Secondary | ICD-10-CM | POA: Diagnosis not present

## 2018-10-26 ENCOUNTER — Ambulatory Visit
Admission: RE | Admit: 2018-10-26 | Discharge: 2018-10-26 | Disposition: A | Discharge status: Home / Self Care | Payer: 59 | Source: Ambulatory Visit | Attending: Radiation Oncology | Admitting: Radiation Oncology

## 2018-10-26 ENCOUNTER — Telehealth (HOSPITAL_COMMUNITY): Payer: Self-pay | Admitting: Vascular Surgery

## 2018-10-26 DIAGNOSIS — C50411 Malignant neoplasm of upper-outer quadrant of right female breast: Secondary | ICD-10-CM | POA: Diagnosis not present

## 2018-10-26 DIAGNOSIS — Z510 Encounter for antineoplastic radiation therapy: Secondary | ICD-10-CM | POA: Diagnosis not present

## 2018-10-26 NOTE — Telephone Encounter (Signed)
Left pt second message to make brst appt w/ db w/ echo first ava/ may overbook before end of year

## 2018-10-27 ENCOUNTER — Ambulatory Visit
Admission: RE | Admit: 2018-10-27 | Discharge: 2018-10-27 | Disposition: A | Discharge status: Home / Self Care | Payer: 59 | Source: Ambulatory Visit | Attending: Radiation Oncology | Admitting: Radiation Oncology

## 2018-10-27 DIAGNOSIS — C50411 Malignant neoplasm of upper-outer quadrant of right female breast: Secondary | ICD-10-CM | POA: Diagnosis not present

## 2018-10-27 DIAGNOSIS — Z510 Encounter for antineoplastic radiation therapy: Secondary | ICD-10-CM | POA: Diagnosis not present

## 2018-10-27 NOTE — Progress Notes (Signed)
[name] [name] has attempted to reach patient multiple times from 11/21-11/25 patient has not returned any phone calls. Also this is a Dr.Bensimhon patient. Patient has cancelled her last two echo appts.

## 2018-10-28 ENCOUNTER — Ambulatory Visit
Admission: RE | Admit: 2018-10-28 | Discharge: 2018-10-28 | Disposition: A | Discharge status: Home / Self Care | Payer: 59 | Source: Ambulatory Visit | Attending: Radiation Oncology | Admitting: Radiation Oncology

## 2018-10-28 DIAGNOSIS — C50411 Malignant neoplasm of upper-outer quadrant of right female breast: Secondary | ICD-10-CM | POA: Diagnosis not present

## 2018-10-28 DIAGNOSIS — Z510 Encounter for antineoplastic radiation therapy: Secondary | ICD-10-CM | POA: Diagnosis not present

## 2018-11-02 ENCOUNTER — Ambulatory Visit
Admission: RE | Admit: 2018-11-02 | Discharge: 2018-11-02 | Disposition: A | Discharge status: Home / Self Care | Payer: 59 | Source: Ambulatory Visit | Attending: Radiation Oncology | Admitting: Radiation Oncology

## 2018-11-02 DIAGNOSIS — C50411 Malignant neoplasm of upper-outer quadrant of right female breast: Secondary | ICD-10-CM | POA: Insufficient documentation

## 2018-11-02 DIAGNOSIS — Z170 Estrogen receptor positive status [ER+]: Secondary | ICD-10-CM | POA: Insufficient documentation

## 2018-11-02 DIAGNOSIS — C50811 Malignant neoplasm of overlapping sites of right female breast: Secondary | ICD-10-CM | POA: Diagnosis not present

## 2018-11-02 DIAGNOSIS — Z510 Encounter for antineoplastic radiation therapy: Secondary | ICD-10-CM

## 2018-11-02 DIAGNOSIS — Z5112 Encounter for antineoplastic immunotherapy: Secondary | ICD-10-CM | POA: Diagnosis not present

## 2018-11-03 ENCOUNTER — Ambulatory Visit
Admission: RE | Admit: 2018-11-03 | Discharge: 2018-11-03 | Disposition: A | Discharge status: Home / Self Care | Payer: 59 | Source: Ambulatory Visit | Attending: Radiation Oncology | Admitting: Radiation Oncology

## 2018-11-03 DIAGNOSIS — Z5112 Encounter for antineoplastic immunotherapy: Secondary | ICD-10-CM | POA: Diagnosis not present

## 2018-11-03 DIAGNOSIS — C50411 Malignant neoplasm of upper-outer quadrant of right female breast: Secondary | ICD-10-CM | POA: Diagnosis not present

## 2018-11-04 ENCOUNTER — Ambulatory Visit
Admission: RE | Admit: 2018-11-04 | Discharge: 2018-11-04 | Disposition: A | Discharge status: Home / Self Care | Payer: 59 | Source: Ambulatory Visit | Attending: Radiation Oncology | Admitting: Radiation Oncology

## 2018-11-04 DIAGNOSIS — C50411 Malignant neoplasm of upper-outer quadrant of right female breast: Secondary | ICD-10-CM | POA: Diagnosis not present

## 2018-11-04 DIAGNOSIS — Z5112 Encounter for antineoplastic immunotherapy: Secondary | ICD-10-CM | POA: Diagnosis not present

## 2018-11-05 ENCOUNTER — Ambulatory Visit
Admission: RE | Admit: 2018-11-05 | Discharge: 2018-11-05 | Disposition: A | Discharge status: Home / Self Care | Payer: 59 | Source: Ambulatory Visit | Attending: Radiation Oncology | Admitting: Radiation Oncology

## 2018-11-05 DIAGNOSIS — Z5112 Encounter for antineoplastic immunotherapy: Secondary | ICD-10-CM | POA: Diagnosis not present

## 2018-11-05 DIAGNOSIS — C50411 Malignant neoplasm of upper-outer quadrant of right female breast: Secondary | ICD-10-CM | POA: Diagnosis not present

## 2018-11-06 ENCOUNTER — Ambulatory Visit
Admission: RE | Admit: 2018-11-06 | Discharge: 2018-11-06 | Disposition: A | Discharge status: Home / Self Care | Payer: 59 | Source: Ambulatory Visit | Attending: Radiation Oncology | Admitting: Radiation Oncology

## 2018-11-06 ENCOUNTER — Other Ambulatory Visit (HOSPITAL_COMMUNITY): Payer: Self-pay | Admitting: *Deleted

## 2018-11-06 DIAGNOSIS — Z5112 Encounter for antineoplastic immunotherapy: Secondary | ICD-10-CM | POA: Diagnosis not present

## 2018-11-06 DIAGNOSIS — C50411 Malignant neoplasm of upper-outer quadrant of right female breast: Secondary | ICD-10-CM

## 2018-11-09 ENCOUNTER — Ambulatory Visit
Admission: RE | Admit: 2018-11-09 | Discharge: 2018-11-09 | Disposition: A | Discharge status: Home / Self Care | Payer: 59 | Source: Ambulatory Visit | Attending: Radiation Oncology | Admitting: Radiation Oncology

## 2018-11-09 DIAGNOSIS — C50411 Malignant neoplasm of upper-outer quadrant of right female breast: Secondary | ICD-10-CM | POA: Diagnosis not present

## 2018-11-09 DIAGNOSIS — Z5112 Encounter for antineoplastic immunotherapy: Secondary | ICD-10-CM | POA: Diagnosis not present

## 2018-11-10 ENCOUNTER — Ambulatory Visit: Payer: 59

## 2018-11-10 ENCOUNTER — Ambulatory Visit
Admission: RE | Admit: 2018-11-10 | Discharge: 2018-11-10 | Disposition: A | Discharge status: Home / Self Care | Payer: 59 | Source: Ambulatory Visit | Attending: Radiation Oncology | Admitting: Radiation Oncology

## 2018-11-10 DIAGNOSIS — C50411 Malignant neoplasm of upper-outer quadrant of right female breast: Secondary | ICD-10-CM | POA: Diagnosis not present

## 2018-11-10 DIAGNOSIS — Z5112 Encounter for antineoplastic immunotherapy: Secondary | ICD-10-CM | POA: Diagnosis not present

## 2018-11-11 ENCOUNTER — Ambulatory Visit
Admission: RE | Admit: 2018-11-11 | Discharge: 2018-11-11 | Disposition: A | Discharge status: Home / Self Care | Payer: 59 | Source: Ambulatory Visit | Attending: Radiation Oncology | Admitting: Radiation Oncology

## 2018-11-11 ENCOUNTER — Ambulatory Visit: Payer: 59 | Admitting: Radiation Oncology

## 2018-11-11 ENCOUNTER — Ambulatory Visit: Payer: 59

## 2018-11-11 DIAGNOSIS — C50411 Malignant neoplasm of upper-outer quadrant of right female breast: Secondary | ICD-10-CM | POA: Diagnosis not present

## 2018-11-11 DIAGNOSIS — Z5112 Encounter for antineoplastic immunotherapy: Secondary | ICD-10-CM | POA: Diagnosis not present

## 2018-11-12 ENCOUNTER — Ambulatory Visit
Admission: RE | Admit: 2018-11-12 | Discharge: 2018-11-12 | Disposition: A | Discharge status: Home / Self Care | Payer: 59 | Source: Ambulatory Visit | Attending: Radiation Oncology | Admitting: Radiation Oncology

## 2018-11-12 ENCOUNTER — Other Ambulatory Visit: Payer: Self-pay | Admitting: Oncology

## 2018-11-12 ENCOUNTER — Inpatient Hospital Stay: Payer: 59

## 2018-11-12 ENCOUNTER — Inpatient Hospital Stay: Payer: 59 | Attending: Oncology

## 2018-11-12 VITALS — BP 133/77 | HR 63 | Temp 98.4°F | Resp 18

## 2018-11-12 DIAGNOSIS — Z170 Estrogen receptor positive status [ER+]: Secondary | ICD-10-CM | POA: Insufficient documentation

## 2018-11-12 DIAGNOSIS — Z5112 Encounter for antineoplastic immunotherapy: Secondary | ICD-10-CM | POA: Insufficient documentation

## 2018-11-12 DIAGNOSIS — C50411 Malignant neoplasm of upper-outer quadrant of right female breast: Secondary | ICD-10-CM | POA: Insufficient documentation

## 2018-11-12 DIAGNOSIS — Z95828 Presence of other vascular implants and grafts: Secondary | ICD-10-CM

## 2018-11-12 DIAGNOSIS — C50811 Malignant neoplasm of overlapping sites of right female breast: Secondary | ICD-10-CM | POA: Insufficient documentation

## 2018-11-12 DIAGNOSIS — Z510 Encounter for antineoplastic radiation therapy: Secondary | ICD-10-CM | POA: Insufficient documentation

## 2018-11-12 LAB — COMPREHENSIVE METABOLIC PANEL
ALT: 13 U/L (ref 0–44)
AST: 14 U/L — ABNORMAL LOW (ref 15–41)
Albumin: 3.4 g/dL — ABNORMAL LOW (ref 3.5–5.0)
Alkaline Phosphatase: 105 U/L (ref 38–126)
Anion gap: 10 (ref 5–15)
BUN: 25 mg/dL — ABNORMAL HIGH (ref 6–20)
CO2: 27 mmol/L (ref 22–32)
Calcium: 10.5 mg/dL — ABNORMAL HIGH (ref 8.9–10.3)
Chloride: 101 mmol/L (ref 98–111)
Creatinine, Ser: 1.51 mg/dL — ABNORMAL HIGH (ref 0.44–1.00)
GFR calc Af Amer: 47 mL/min — ABNORMAL LOW (ref >60)
GFR calc non Af Amer: 41 mL/min — ABNORMAL LOW (ref >60)
Glucose, Bld: 106 mg/dL — ABNORMAL HIGH (ref 70–99)
Potassium: 3.9 mmol/L (ref 3.5–5.1)
Sodium: 138 mmol/L (ref 135–145)
Total Bilirubin: 0.3 mg/dL (ref 0.3–1.2)
Total Protein: 7 g/dL (ref 6.5–8.1)

## 2018-11-12 LAB — CBC WITH DIFFERENTIAL/PLATELET
Abs Immature Granulocytes: 0.01 10*3/uL (ref 0.00–0.07)
Basophils Absolute: 0 10*3/uL (ref 0.0–0.1)
Basophils Relative: 1 %
Eosinophils Absolute: 0.1 10*3/uL (ref 0.0–0.5)
Eosinophils Relative: 3 %
HCT: 34 % — ABNORMAL LOW (ref 36.0–46.0)
Hemoglobin: 11 g/dL — ABNORMAL LOW (ref 12.0–15.0)
Immature Granulocytes: 0 %
Lymphocytes Relative: 18 %
Lymphs Abs: 0.7 10*3/uL (ref 0.7–4.0)
MCH: 27.4 pg (ref 26.0–34.0)
MCHC: 32.4 g/dL (ref 30.0–36.0)
MCV: 84.6 fL (ref 80.0–100.0)
Monocytes Absolute: 0.5 10*3/uL (ref 0.1–1.0)
Monocytes Relative: 13 %
Neutro Abs: 2.7 10*3/uL (ref 1.7–7.7)
Neutrophils Relative %: 65 %
Platelets: 148 10*3/uL — ABNORMAL LOW (ref 150–400)
RBC: 4.02 MIL/uL (ref 3.87–5.11)
RDW: 14.7 % (ref 11.5–15.5)
WBC: 4 10*3/uL (ref 4.0–10.5)
nRBC: 0 % (ref 0.0–0.2)

## 2018-11-12 MED ORDER — DIPHENHYDRAMINE HCL 25 MG PO CAPS
ORAL_CAPSULE | ORAL | Status: AC
  Filled 2018-11-12: qty 1

## 2018-11-12 MED ORDER — ACETAMINOPHEN 325 MG PO TABS
ORAL_TABLET | ORAL | Status: AC
  Filled 2018-11-12: qty 2

## 2018-11-12 MED ORDER — SODIUM CHLORIDE 0.9 % IV SOLN
Freq: Once | INTRAVENOUS | Status: AC
  Administered 2018-11-12: 13:00:00 via INTRAVENOUS
  Filled 2018-11-12: qty 250

## 2018-11-12 MED ORDER — DIPHENHYDRAMINE HCL 25 MG PO CAPS
25.0000 mg | ORAL_CAPSULE | Freq: Once | ORAL | Status: AC
  Administered 2018-11-12: 25 mg via ORAL

## 2018-11-12 MED ORDER — SODIUM CHLORIDE 0.9% FLUSH
10.0000 mL | INTRAVENOUS | Status: DC | PRN
  Administered 2018-11-12: 10 mL
  Filled 2018-11-12: qty 10

## 2018-11-12 MED ORDER — ACETAMINOPHEN 325 MG PO TABS
650.0000 mg | ORAL_TABLET | Freq: Once | ORAL | Status: AC
  Administered 2018-11-12: 650 mg via ORAL

## 2018-11-12 MED ORDER — SODIUM CHLORIDE 0.9% FLUSH
10.0000 mL | Freq: Once | INTRAVENOUS | Status: AC
  Administered 2018-11-12: 10 mL
  Filled 2018-11-12: qty 10

## 2018-11-12 MED ORDER — TRASTUZUMAB CHEMO 150 MG IV SOLR
6.0000 mg/kg | Freq: Once | INTRAVENOUS | Status: AC
  Administered 2018-11-12: 588 mg via INTRAVENOUS
  Filled 2018-11-12: qty 28

## 2018-11-12 MED ORDER — HEPARIN SOD (PORK) LOCK FLUSH 100 UNIT/ML IV SOLN
500.0000 [IU] | Freq: Once | INTRAVENOUS | Status: AC | PRN
  Administered 2018-11-12: 500 [IU]
  Filled 2018-11-12: qty 5

## 2018-11-12 NOTE — Patient Instructions (Signed)
[facility] Discharge Instructions for Patients Receiving Chemotherapy  Today you received the following chemotherapy agents Herceptin  To help prevent nausea and vomiting after your treatment, we encourage you to take your nausea medication as directed   If you develop nausea and vomiting that is not controlled by your nausea medication, call the clinic.   BELOW ARE SYMPTOMS THAT SHOULD BE REPORTED IMMEDIATELY:  *FEVER GREATER THAN 100.5 F  *CHILLS WITH OR WITHOUT FEVER  NAUSEA AND VOMITING THAT IS NOT CONTROLLED WITH YOUR NAUSEA MEDICATION  *UNUSUAL SHORTNESS OF BREATH  *UNUSUAL BRUISING OR BLEEDING  TENDERNESS IN MOUTH AND THROAT WITH OR WITHOUT PRESENCE OF ULCERS  *URINARY PROBLEMS  *BOWEL PROBLEMS  UNUSUAL RASH Items with * indicate a potential emergency and should be followed up as soon as possible.  Feel free to call the clinic should you have any questions or concerns. The clinic phone number is [phone].  Please show the CHEMO ALERT CARD at check-in to the Emergency Department and triage nurse.   

## 2018-11-13 ENCOUNTER — Ambulatory Visit
Admission: RE | Admit: 2018-11-13 | Discharge: 2018-11-13 | Disposition: A | Discharge status: Home / Self Care | Payer: 59 | Source: Ambulatory Visit | Attending: Radiation Oncology | Admitting: Radiation Oncology

## 2018-11-13 DIAGNOSIS — C50919 Malignant neoplasm of unspecified site of unspecified female breast: Secondary | ICD-10-CM | POA: Diagnosis not present

## 2018-11-13 DIAGNOSIS — Z5112 Encounter for antineoplastic immunotherapy: Secondary | ICD-10-CM | POA: Diagnosis not present

## 2018-11-16 ENCOUNTER — Encounter: Payer: Self-pay | Admitting: Radiation Oncology

## 2018-11-16 ENCOUNTER — Ambulatory Visit: Payer: 59

## 2018-11-16 ENCOUNTER — Ambulatory Visit
Admission: RE | Admit: 2018-11-16 | Discharge: 2018-11-16 | Disposition: A | Discharge status: Home / Self Care | Payer: 59 | Source: Ambulatory Visit | Attending: Radiation Oncology | Admitting: Radiation Oncology

## 2018-11-16 DIAGNOSIS — Z5112 Encounter for antineoplastic immunotherapy: Secondary | ICD-10-CM | POA: Diagnosis not present

## 2018-11-16 DIAGNOSIS — C50411 Malignant neoplasm of upper-outer quadrant of right female breast: Secondary | ICD-10-CM | POA: Diagnosis not present

## 2018-11-17 ENCOUNTER — Ambulatory Visit: Payer: 59

## 2018-11-19 ENCOUNTER — Ambulatory Visit (HOSPITAL_BASED_OUTPATIENT_CLINIC_OR_DEPARTMENT_OTHER)
Admission: RE | Admit: 2018-11-19 | Discharge: 2018-11-19 | Disposition: A | Discharge status: Home / Self Care | Payer: 59 | Source: Ambulatory Visit | Attending: Internal Medicine | Admitting: Internal Medicine

## 2018-11-19 ENCOUNTER — Ambulatory Visit (HOSPITAL_COMMUNITY)
Admission: RE | Admit: 2018-11-19 | Discharge: 2018-11-19 | Disposition: A | Discharge status: Home / Self Care | Payer: 59 | Source: Ambulatory Visit | Attending: Internal Medicine | Admitting: Internal Medicine

## 2018-11-19 ENCOUNTER — Encounter (HOSPITAL_COMMUNITY): Payer: Self-pay | Admitting: Internal Medicine

## 2018-11-19 VITALS — BP 144/92 | HR 68 | Wt 214.4 lb

## 2018-11-19 DIAGNOSIS — Z79899 Other long term (current) drug therapy: Secondary | ICD-10-CM | POA: Insufficient documentation

## 2018-11-19 DIAGNOSIS — Z91040 Latex allergy status: Secondary | ICD-10-CM | POA: Insufficient documentation

## 2018-11-19 DIAGNOSIS — C50411 Malignant neoplasm of upper-outer quadrant of right female breast: Secondary | ICD-10-CM | POA: Diagnosis not present

## 2018-11-19 DIAGNOSIS — I10 Essential (primary) hypertension: Secondary | ICD-10-CM | POA: Diagnosis not present

## 2018-11-19 DIAGNOSIS — Z8673 Personal history of transient ischemic attack (TIA), and cerebral infarction without residual deficits: Secondary | ICD-10-CM | POA: Diagnosis not present

## 2018-11-19 DIAGNOSIS — Z833 Family history of diabetes mellitus: Secondary | ICD-10-CM | POA: Insufficient documentation

## 2018-11-19 DIAGNOSIS — Z823 Family history of stroke: Secondary | ICD-10-CM | POA: Insufficient documentation

## 2018-11-19 DIAGNOSIS — Z7982 Long term (current) use of aspirin: Secondary | ICD-10-CM | POA: Diagnosis not present

## 2018-11-19 DIAGNOSIS — Z888 Allergy status to other drugs, medicaments and biological substances status: Secondary | ICD-10-CM | POA: Insufficient documentation

## 2018-11-19 DIAGNOSIS — G629 Polyneuropathy, unspecified: Secondary | ICD-10-CM | POA: Insufficient documentation

## 2018-11-19 DIAGNOSIS — Z8249 Family history of ischemic heart disease and other diseases of the circulatory system: Secondary | ICD-10-CM | POA: Insufficient documentation

## 2018-11-19 MED ORDER — LOSARTAN POTASSIUM 50 MG PO TABS: 50.0000 mg | ORAL_TABLET | Freq: Every day | ORAL | 11 refills | Status: DC

## 2018-11-19 NOTE — Patient Instructions (Signed)
Take Losartan 50mg  daily.

## 2018-11-19 NOTE — Progress Notes (Signed)
  Echocardiogram 2D Echocardiogram has been performed.  Jennette Dubin 11/19/2018, 9:14 AM

## 2018-11-19 NOTE — Progress Notes (Signed)
CARDIO-ONCOLOGY CLINIC CONSULT NOTE  Referring Physician: Dr. Jana Hakim  HPI:  [name] [name] is 47 y.o. female with h/o HTN, TIA, anxiety and left breast cancer (treated in 2000 at [facility]) who was diagnosed with triple-positive R breast cancer in 4/19. Referred by Dr. Jana Hakim for enrollment into the Cardio-Oncology program.  Cancer history/treatment plan:  (1) status post left lumpectomy in 2000 for a (?) T2N0 breast cancer,             (a) status post adjuvant chemotherapy including adriamycin             (b) status post adjuvant radiation             (c) did not receive antiestrogens  (2) status post right breast biopsy 03/16/2018 for a clinical mT2-3 N2, anatomic stage III invasive ductal carcinoma, grade 2, triple positive, with an MIB-1 of 80%.             (a) bone scan and chest CT scan negative for metastases except for a thoracic spine lytic lesion of concern  (3) neoadjuvant chemotherapy consisting of carboplatin, docetaxel, trastuzumab and Pertuzumab every 3 weeks x 6 starting 04/16/2018             (a) pertuzumab held beginning with cycle 2 because of diarrhea  (4) trastuzumab to be continued for a year  (5) 9/25 s/p surgical resection  (6) adjuvant radiation to the right breast completed   (7) antiestrogens to start at the completion of local treatment  Echo today EF 60% GLS -23% Personally reviewed   Echo 06/29/18 EF 65-70%  GLS -20.6% LS 9.5 cm/s  Echo 4/19 EF 60-65% mild LVH Grade II DD  Doing well. Still on Herceptin. Tolerating that well. Will finish in 5/20. Just finished XRT. Has more energy. Still going to [employer]. SBP running high.     Past Medical History:  Diagnosis Date  . Anxiety   . Breast cancer, left breast ([place]) 2000   Underwent lumpectomy, chemotherapy and radiation  . Facial paralysis/Bells palsy    left side  . Family history of lung cancer   . Family history of lymphoma   . Hypertension   . Neuropathy    IN FINGERS AND TOES    RECENT INFUSION OF CHEMO  . Personal history of breast cancer 05/06/2018  . Renal disorder    just after TIA acute kidney injury  . TIA (transient ischemic attack) 03/16/2017   [facility]    Current Outpatient Medications  Medication Sig Dispense Refill  . aspirin EC 81 MG tablet Take 81 mg by mouth daily.    Marland Kitchen BYSTOLIC 20 MG TABS TAKE 1 TABLET (20 MG TOTAL) BY MOUTH DAILY. (Patient taking differently: Take 20 mg by mouth daily. ) 30 tablet 6  . Cyanocobalamin (VITAMIN B12) 500 MCG TABS Take 500 mcg by mouth daily.     . ferrous sulfate 325 (65 FE) MG tablet Take 1 tablet (325 mg total) by mouth daily. 30 tablet 0  . lidocaine-prilocaine (EMLA) cream Apply 1 application topically as needed.    . Multiple Vitamin (MULITIVITAMIN WITH MINERALS) TABS Take 1 tablet by mouth daily.      Marland Kitchen spironolactone (ALDACTONE) 50 MG tablet TAKE 1 TABLET (50 MG TOTAL) BY MOUTH DAILY. 30 tablet 6  . cloNIDine (CATAPRES) 0.2 MG tablet Take 0.2 mg by mouth daily as needed (BP above 150).     . diphenoxylate-atropine (LOMOTIL) 2.5-0.025 MG tablet Take 1 tablet by mouth 4 (  four) times daily as needed for diarrhea or loose stools. (Patient not taking: Reported on 11/19/2018) 30 tablet 1  . HYDROcodone-acetaminophen (NORCO/VICODIN) 5-325 MG tablet Take 1-2 tablets by mouth every 6 (six) hours as needed for moderate pain. (Patient not taking: Reported on 11/19/2018) 15 tablet 0   No current facility-administered medications for this encounter.     Allergies  Allergen Reactions  . Ace Inhibitors Swelling    Swelling of lips and face  . Lisinopril Swelling    Swelling of lips, face  . Amlodipine Swelling    Leg swelling  . Adhesive [Tape] Itching and Rash    Please use paper tape  . Latex Itching and Rash      Social History   Socioeconomic History  . Marital status: Married    Spouse name: [name]  . Number of children: 3  . Years of education: 76  . Highest education level: Not on file   Occupational History    Comment: office admin. [employer]  . Financial resource strain: Not on file  . Food insecurity:    Worry: Not on file    Inability: Not on file  . Transportation needs:    Medical: No    Non-medical: No  Tobacco Use  . Smoking status: Never Smoker  . Smokeless tobacco: Never Used  Substance and Sexual Activity  . Alcohol use: No  . Drug use: No  . Sexual activity: Yes    Partners: Male    Birth control/protection: Surgical  Lifestyle  . Physical activity:    Days per week: Not on file    Minutes per session: Not on file  . Stress: Not on file  Relationships  . Social connections:    Talks on phone: Not on file    Gets together: Not on file    Attends religious service: Not on file    Active member of club or organization: Not on file    Attends meetings of clubs or organizations: Not on file    Relationship status: Not on file  . Intimate partner violence:    Fear of current or ex partner: No    Emotionally abused: No    Physically abused: No    Forced sexual activity: No  Other Topics Concern  . Not on file  Social History Narrative   Lives with family   No caffeine      Family History  Problem Relation Age of Onset  . Hypertension Mother   . Hypertension Father   . Diverticulosis Father   . Diabetes Father   . Lung cancer Father 6       hx smoking  . Hypertension Maternal Grandmother   . CAD Maternal Grandmother   . Alzheimer's disease Maternal Grandmother        died at 37  . Hypertension Paternal Grandmother   . CAD Paternal Grandmother   . Stroke Paternal Grandfather   . Sickle cell trait Paternal Grandfather   . Pneumonia Maternal Aunt        died at a young adult  . Lymphoma Paternal Aunt 57    Vitals:   11/19/18 0844  BP: (!) 144/92  Pulse: 68  SpO2: 98%  Weight: 97.3 kg (214 lb 6.4 oz)    PHYSICAL EXAM: General:  Well appearing. No resp difficulty HEENT: normal Neck: supple. no JVD. Carotids  2+ bilat; no bruits. No lymphadenopathy or thryomegaly appreciated. Cor: PMI nondisplaced. Regular rate & rhythm. No rubs,  gallops or murmurs. R port. Scars from XRT.  Lungs: clear Abdomen: soft, nontender, nondistended. No hepatosplenomegaly. No bruits or masses. Good bowel sounds. Extremities: no cyanosis, clubbing, rash, edema Neuro: alert & orientedx3, cranial nerves grossly intact. moves all 4 extremities w/o difficulty. Affect pleasant  ASSESSMENT & PLAN: 1. Right Breast Cancer diagnosed 4/19 - triple positive - I reviewed echos personally. EF and Doppler parameters stable. No HF on exam. Continue Herceptin.   2. HTN - remains elevated. Will amlodipine 5mg  daily.   Glori Bickers, MD  9:21 AM

## 2018-11-19 NOTE — Addendum Note (Signed)
Encounter addended by: Harvie Junior, CMA on: 11/19/2018 9:37 AM  Actions taken: Order Reconciliation Section accessed, Order list changed, Clinical Note Signed

## 2018-11-19 NOTE — Addendum Note (Signed)
Encounter addended by: Valeda Malm, RN on: 11/19/2018 10:43 AM  Actions taken: Order list changed, Diagnosis association updated

## 2018-11-30 NOTE — Progress Notes (Signed)
  Radiation Oncology         [phone]) [phone] ________________________________  Name: [name] [name] MRN: [id number]  Date: 11/16/2018  DOB: 01-05-1971  End of Treatment Note  Diagnosis:   47 y.o. female with Stage IIA, cT2N2M0 grade 2, Triple positive invasive ductal carcinoma of the right breast  Indication for treatment:  Curative       Radiation treatment dates:   09/30/2018 - 11/16/2018  Site/dose:   The patient initially received a dose of 50.4 Gy in 28 fractions to the right chest wall and supraclavicular region. This was delivered using a 3-D conformal, 4 field technique. The patient then received a boost to the mastectomy scar. This delivered an additional 10 Gy in 5 fractions using an en face electron field. The total dose was 60.4 Gy.  Narrative: The patient tolerated radiation treatment relatively well.   The patient had some expected skin irritation with hyperpigmentation and dry desquamation as she progressed during treatment. Moist desquamation was not present at the end of treatment. She also endorses increased fatigue. Edema in left upper extremity noted - patient to see physical therapy for sleeve.  Plan: The patient has completed radiation treatment. The patient will return to radiation oncology clinic for routine followup in one month. I advised the patient to call or return sooner if they have any questions or concerns related to their recovery or treatment. ________________________________  Jodelle Gross, MD, PhD  This document serves as a record of services personally performed by Kyung Rudd, MD. It was created on his behalf by Rae Lips, a trained medical scribe. The creation of this record is based on the scribe's personal observations and the provider's statements to them. This document has been checked and approved by the attending provider.

## 2018-12-03 ENCOUNTER — Encounter: Payer: Self-pay | Admitting: *Deleted

## 2018-12-03 ENCOUNTER — Inpatient Hospital Stay: Payer: BLUE CROSS/BLUE SHIELD

## 2018-12-03 ENCOUNTER — Telehealth: Payer: Self-pay | Admitting: Oncology

## 2018-12-03 ENCOUNTER — Inpatient Hospital Stay: Payer: BLUE CROSS/BLUE SHIELD | Attending: Oncology

## 2018-12-03 ENCOUNTER — Inpatient Hospital Stay (HOSPITAL_BASED_OUTPATIENT_CLINIC_OR_DEPARTMENT_OTHER): Payer: BLUE CROSS/BLUE SHIELD | Admitting: Oncology

## 2018-12-03 VITALS — BP 129/81 | HR 60 | Temp 98.3°F | Resp 18 | Ht 59.0 in | Wt 215.0 lb

## 2018-12-03 DIAGNOSIS — R6882 Decreased libido: Secondary | ICD-10-CM

## 2018-12-03 DIAGNOSIS — C50811 Malignant neoplasm of overlapping sites of right female breast: Secondary | ICD-10-CM | POA: Insufficient documentation

## 2018-12-03 DIAGNOSIS — Z170 Estrogen receptor positive status [ER+]: Secondary | ICD-10-CM

## 2018-12-03 DIAGNOSIS — C50411 Malignant neoplasm of upper-outer quadrant of right female breast: Secondary | ICD-10-CM

## 2018-12-03 DIAGNOSIS — N952 Postmenopausal atrophic vaginitis: Secondary | ICD-10-CM

## 2018-12-03 DIAGNOSIS — Z5112 Encounter for antineoplastic immunotherapy: Secondary | ICD-10-CM | POA: Diagnosis not present

## 2018-12-03 DIAGNOSIS — Z9011 Acquired absence of right breast and nipple: Secondary | ICD-10-CM

## 2018-12-03 DIAGNOSIS — Z95828 Presence of other vascular implants and grafts: Secondary | ICD-10-CM

## 2018-12-03 LAB — COMPREHENSIVE METABOLIC PANEL
ALT: 18 U/L (ref 0–44)
AST: 15 U/L (ref 15–41)
Albumin: 3.4 g/dL — ABNORMAL LOW (ref 3.5–5.0)
Alkaline Phosphatase: 115 U/L (ref 38–126)
Anion gap: 7 (ref 5–15)
BUN: 30 mg/dL — ABNORMAL HIGH (ref 6–20)
CO2: 28 mmol/L (ref 22–32)
Calcium: 11 mg/dL — ABNORMAL HIGH (ref 8.9–10.3)
Chloride: 103 mmol/L (ref 98–111)
Creatinine, Ser: 1.36 mg/dL — ABNORMAL HIGH (ref 0.44–1.00)
GFR calc Af Amer: 54 mL/min — ABNORMAL LOW (ref >60)
GFR calc non Af Amer: 46 mL/min — ABNORMAL LOW (ref >60)
Glucose, Bld: 97 mg/dL (ref 70–99)
Potassium: 3.9 mmol/L (ref 3.5–5.1)
Sodium: 138 mmol/L (ref 135–145)
Total Bilirubin: 0.3 mg/dL (ref 0.3–1.2)
Total Protein: 7.1 g/dL (ref 6.5–8.1)

## 2018-12-03 LAB — CBC WITH DIFFERENTIAL/PLATELET
Abs Immature Granulocytes: 0.01 10*3/uL (ref 0.00–0.07)
Basophils Absolute: 0 10*3/uL (ref 0.0–0.1)
Basophils Relative: 1 %
Eosinophils Absolute: 0.1 10*3/uL (ref 0.0–0.5)
Eosinophils Relative: 2 %
HCT: 35.3 % — ABNORMAL LOW (ref 36.0–46.0)
Hemoglobin: 11.2 g/dL — ABNORMAL LOW (ref 12.0–15.0)
Immature Granulocytes: 0 %
Lymphocytes Relative: 19 %
Lymphs Abs: 0.7 10*3/uL (ref 0.7–4.0)
MCH: 26.7 pg (ref 26.0–34.0)
MCHC: 31.7 g/dL (ref 30.0–36.0)
MCV: 84 fL (ref 80.0–100.0)
Monocytes Absolute: 0.5 10*3/uL (ref 0.1–1.0)
Monocytes Relative: 13 %
Neutro Abs: 2.4 10*3/uL (ref 1.7–7.7)
Neutrophils Relative %: 65 %
Platelets: 153 10*3/uL (ref 150–400)
RBC: 4.2 MIL/uL (ref 3.87–5.11)
RDW: 15.3 % (ref 11.5–15.5)
WBC: 3.7 10*3/uL — ABNORMAL LOW (ref 4.0–10.5)
nRBC: 0 % (ref 0.0–0.2)

## 2018-12-03 MED ORDER — ANASTROZOLE 1 MG PO TABS: 1.0000 mg | ORAL_TABLET | Freq: Every day | ORAL | 4 refills | Status: DC

## 2018-12-03 MED ORDER — DIPHENHYDRAMINE HCL 25 MG PO CAPS
25.0000 mg | ORAL_CAPSULE | Freq: Once | ORAL | Status: AC
  Administered 2018-12-03: 25 mg via ORAL

## 2018-12-03 MED ORDER — SODIUM CHLORIDE 0.9% FLUSH
10.0000 mL | Freq: Once | INTRAVENOUS | Status: AC
  Administered 2018-12-03: 10 mL
  Filled 2018-12-03: qty 10

## 2018-12-03 MED ORDER — ACETAMINOPHEN 325 MG PO TABS
650.0000 mg | ORAL_TABLET | Freq: Once | ORAL | Status: AC
  Administered 2018-12-03: 650 mg via ORAL

## 2018-12-03 MED ORDER — SODIUM CHLORIDE 0.9 % IV SOLN
Freq: Once | INTRAVENOUS | Status: AC
  Administered 2018-12-03: 13:00:00 via INTRAVENOUS
  Filled 2018-12-03: qty 250

## 2018-12-03 MED ORDER — TRASTUZUMAB CHEMO 150 MG IV SOLR
6.0000 mg/kg | Freq: Once | INTRAVENOUS | Status: AC
  Administered 2018-12-03: 588 mg via INTRAVENOUS
  Filled 2018-12-03: qty 28

## 2018-12-03 MED ORDER — HEPARIN SOD (PORK) LOCK FLUSH 100 UNIT/ML IV SOLN
500.0000 [IU] | Freq: Once | INTRAVENOUS | Status: AC | PRN
  Administered 2018-12-03: 500 [IU]
  Filled 2018-12-03: qty 5

## 2018-12-03 MED ORDER — SODIUM CHLORIDE 0.9% FLUSH
10.0000 mL | INTRAVENOUS | Status: DC | PRN
  Administered 2018-12-03: 10 mL
  Filled 2018-12-03: qty 10

## 2018-12-03 MED ORDER — DIPHENHYDRAMINE HCL 25 MG PO CAPS
ORAL_CAPSULE | ORAL | Status: AC
  Filled 2018-12-03: qty 1

## 2018-12-03 MED ORDER — ACETAMINOPHEN 325 MG PO TABS
ORAL_TABLET | ORAL | Status: AC
  Filled 2018-12-03: qty 2

## 2018-12-03 NOTE — Telephone Encounter (Signed)
Gave avs and calendar ° °

## 2018-12-03 NOTE — Progress Notes (Signed)
[place]  [phone] [phone]     ID: [name] [name] DOB: Apr 22, 1971  MR#: [id number]  ONG#:[id number]  Patient Care Team: Marda Stalker, PA-C as PCP - General (Family Medicine) Jerline Pain, MD as PCP - Cardiology (Cardiology) Magrinat, Virgie Dad, MD as Consulting Physician (Oncology) Loreta Ave, MD as Referring Physician (Internal Medicine) Donato Heinz, MD as Consulting Physician (Nephrology) Kyung Rudd, MD as Consulting Physician (Radiation Oncology) Jovita Kussmaul, MD as Consulting Physician (General Surgery) Larey Dresser, MD as Consulting Physician (Cardiology) OTHER MD:  CHIEF COMPLAINT: Triple positive breast cancer  CURRENT TREATMENT: Trastuzumab; anastrozole  HISTORY OF CURRENT ILLNESS: From the original intake note:  [name] [name] palpated a lump on 01/15/2018. She felt soreness and shooting pain under her arm and in the right breast. She underwent bilateral diagnostic mammography with tomography and right breast ultrasonography at [facility] on 03/12/2018 showing: breast density category C. The is architectural distortion at the 11 o'clock position. There is also an oval mass in the right breast anterior depth. Examination of the right axilla showed enlarged lymph nodes. Ultrasonography showed a 4.6 cm mass in the right breast upper outer quadrant posterior depth. An additional 1.2 cm oval mass in the right breast 12 o'clock middle depth. The lymph nodes in the right axillary are highly suggestive of malignancy.   Accordingly on 03/16/2018 she proceeded to biopsy of the right breast area in question. The pathology from this procedure showed ([id number]): At both the 11 and 12 o'clock positions: Invasive ductal carcinoma grade II. Ductal carcinoma in situ, high grade, with necrosis and calcifications. Prognostic indicators significant for: estrogen receptor, 90% positive and progesterone receptor, 40% positive, both with  strong staining intensity. Proliferation marker Ki67 at 80%. HER2 amplified with ratios HER2/CEP17 SIGNALS 6.90 and average HER2 copies per cell 14.50  The patient's subsequent history is as detailed below.  INTERVAL HISTORY: [name] returns today for follow-up and treatment of her triple positive breast cancer. She completed her daily radiation treatments on 11/16/2018. She experienced skin "burning" to her chest and back, but states she did well overall.  She continues on trastuzumab, with a dose due today. She denies any side effects.  Since her last visit here, she also had an echocardiogram, dated 11/19/2018, showing a well-preserved ejection fraction of 60-65%.  She is now ready to consider antiestrogens.  REVIEW OF SYSTEMS: [name] reports returning to work, which she was happy about. She is experiencing menopause (last period was a year ago this month), and she denies hot flashes or mood changes. The patient denies unusual headaches, visual changes, nausea, vomiting, stiff neck, dizziness, or gait imbalance. There has been no cough, phlegm production, or pleurisy, no chest pain or pressure, and no change in bowel or bladder habits. The patient denies fever, rash, bleeding, unexplained fatigue or unexplained weight loss. A detailed review of systems was otherwise entirely negative.   PAST MEDICAL HISTORY: Past Medical History:  Diagnosis Date  . Anxiety   . Breast cancer, left breast ([place]) 2000   Underwent lumpectomy, chemotherapy and radiation  . Facial paralysis/Bells palsy    left side  . Family history of lung cancer   . Family history of lymphoma   . Hypertension   . Neuropathy    IN FINGERS AND TOES   RECENT INFUSION OF CHEMO  . Personal history of breast cancer 05/06/2018  . Renal disorder    just after TIA acute kidney injury  . TIA (transient ischemic attack) 03/16/2017  [facility]   The patient has a previous history of left breast cancer diagnosed in 2000. She  was seen by [facility] in [place], [place] under Dr. Loreta Ave. According to the patient, her left breast cancer was stage II with no lymph node involvement (likely T2N0). She had chemotherapy every 3 weeks for about 6 months. She did not takes any "red drugs." She also did not take anti-estrogens (likely ER/PR negative).   TIA in 2018. She saw a neurologist as a precaution. She denies any clotting issues.   PAST SURGICAL HISTORY: Past Surgical History:  Procedure Laterality Date  . BREAST LUMPECTOMY WITH AXILLARY LYMPH NODE BIOPSY Left 2000   biopsy  . BREAST SURGERY     partial mastectomy with lymph node removal  . MASTECTOMY MODIFIED RADICAL Right 08/26/2018   Procedure: MASTECTOMY MODIFIED RADICAL;  Surgeon: Jovita Kussmaul, MD;  Location: [place];  Service: General;  Laterality: Right;  . MODIFIED MASTECTOMY Right 08/26/2018  . PORTACATH PLACEMENT Left 04/08/2018   Procedure: INSERTION PORT-A-CATH;  Surgeon: Jovita Kussmaul, MD;  Location: [place];  Service: General;  Laterality: Left;  . TUBAL LIGATION Bilateral 2004  . WISDOM TOOTH EXTRACTION      FAMILY HISTORY Family History  Problem Relation Age of Onset  . Hypertension Mother   . Hypertension Father   . Diverticulosis Father   . Diabetes Father   . Lung cancer Father 69       hx smoking  . Hypertension Maternal Grandmother   . CAD Maternal Grandmother   . Alzheimer's disease Maternal Grandmother        died at 65  . Hypertension Paternal Grandmother   . CAD Paternal Grandmother   . Stroke Paternal Grandfather   . Sickle cell trait Paternal Grandfather   . Pneumonia Maternal Aunt        died at a young adult  . Lymphoma Paternal Aunt 44  The patient's father is alive at age 88 and had a history of lung cancer. The patient's mother is alive at age 39. The patient has 1 brother and no sisters. She denies a family history of breast or ovarian cancer.    GYNECOLOGIC HISTORY:  No LMP recorded. (Menstrual status:  Perimenopausal). Menarche: 48 years old Age at first live birth: 48 years old She is GXP3. She is no longer having periods, which were irregular and light. Her LMP was  January 2019 and  December 2018. She is not having hot flashes. The patient used oral contraceptive from [id number] and the Depo-provera shot from [id number] with no complications. She never used HRT.    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 Her husband, [name], is a Administrator. The patient's son, Shea Stakes age 72, works at [employer] in [place]. The patient's daughters, Lilia Pro age 32, and Levonne Spiller age 91, are students. The patient's adopted son, Vonna Kotyk age 9, is also a Ship broker.      ADVANCED DIRECTIVES:    HEALTH MAINTENANCE: Social History   Tobacco Use  . Smoking status: Never Smoker  . Smokeless tobacco: Never Used  Substance Use Topics  . Alcohol use: No  . Drug use: No     Colonoscopy:   PAP: 2015  Bone density:   Allergies  Allergen Reactions  . Ace Inhibitors Swelling    Swelling of lips and face  . Lisinopril Swelling    Swelling of lips, face  . Amlodipine Swelling    Leg swelling  .  Adhesive [Tape] Itching and Rash    Please use paper tape  . Latex Itching and Rash    Current Outpatient Medications  Medication Sig Dispense Refill  . anastrozole (ARIMIDEX) 1 MG tablet Take 1 tablet (1 mg total) by mouth daily. 90 tablet 4  . aspirin EC 81 MG tablet Take 81 mg by mouth daily.    Marland Kitchen BYSTOLIC 20 MG TABS TAKE 1 TABLET (20 MG TOTAL) BY MOUTH DAILY. (Patient taking differently: Take 20 mg by mouth daily. ) 30 tablet 6  . cloNIDine (CATAPRES) 0.2 MG tablet Take 0.2 mg by mouth daily as needed (BP above 150).     . Cyanocobalamin (VITAMIN B12) 500 MCG TABS Take 500 mcg by mouth daily.     . diphenoxylate-atropine (LOMOTIL) 2.5-0.025 MG tablet Take 1 tablet by mouth 4 (four) times daily as needed for diarrhea or loose stools. (Patient not taking: Reported on 11/19/2018) 30  tablet 1  . ferrous sulfate 325 (65 FE) MG tablet Take 1 tablet (325 mg total) by mouth daily. 30 tablet 0  . HYDROcodone-acetaminophen (NORCO/VICODIN) 5-325 MG tablet Take 1-2 tablets by mouth every 6 (six) hours as needed for moderate pain. (Patient not taking: Reported on 11/19/2018) 15 tablet 0  . lidocaine-prilocaine (EMLA) cream Apply 1 application topically as needed.    Marland Kitchen losartan (COZAAR) 50 MG tablet Take 1 tablet (50 mg total) by mouth daily. 30 tablet 11  . Multiple Vitamin (MULITIVITAMIN WITH MINERALS) TABS Take 1 tablet by mouth daily.      Marland Kitchen spironolactone (ALDACTONE) 50 MG tablet TAKE 1 TABLET (50 MG TOTAL) BY MOUTH DAILY. 30 tablet 6   No current facility-administered medications for this visit.     OBJECTIVE: Middle-aged African-American woman who appears well  Vitals:   12/03/18 1036  BP: 129/81  Pulse: 60  Resp: 18  Temp: 98.3 F (36.8 C)  SpO2: 100%     Body mass index is 43.42 kg/m.   Wt Readings from Last 3 Encounters:  12/03/18 215 lb (97.5 kg)  11/19/18 214 lb 6.4 oz (97.3 kg)  10/22/18 213 lb 11.2 oz (96.9 kg)  ECOG FS:1 - Symptomatic but completely ambulatory  Sclerae unicteric, EOMs intact No cervical or supraclavicular adenopathy Lungs no rales or rhonchi Heart regular rate and rhythm Abd soft, nontender, positive bowel sounds MSK no focal spinal tenderness, no upper extremity lymphedema Neuro: nonfocal, well oriented, appropriate affect Breasts: The right breast is status post mastectomy with no evidence of disease recurrence.  The hyperpigmentation from adjuvant radiation is nearly completely resolved.  The left breast is status post lumpectomy and radiation.  There is no evidence of disease recurrence.  Both axillae are benign.      LAB RESULTS:  CMP     Component Value Date/Time   NA 138 12/03/2018 1014   K 3.9 12/03/2018 1014   CL 103 12/03/2018 1014   CO2 28 12/03/2018 1014   GLUCOSE 97 12/03/2018 1014   BUN 30 (H) 12/03/2018 1014    CREATININE 1.36 (H) 12/03/2018 1014   CREATININE 1.29 (H) 03/25/2018 1129   CALCIUM 11.0 (H) 12/03/2018 1014   CALCIUM 10.2 06/25/2018 1425   PROT 7.1 12/03/2018 1014   ALBUMIN 3.4 (L) 12/03/2018 1014   AST 15 12/03/2018 1014   AST 22 03/25/2018 1129   ALT 18 12/03/2018 1014   ALT 29 03/25/2018 1129   ALKPHOS 115 12/03/2018 1014   BILITOT 0.3 12/03/2018 1014   BILITOT 0.3 03/25/2018 1129  GFRNONAA 46 (L) 12/03/2018 1014   GFRNONAA 49 (L) 03/25/2018 1129   GFRAA 54 (L) 12/03/2018 1014   GFRAA 57 (L) 03/25/2018 1129    No results found for: TOTALPROTELP, ALBUMINELP, A1GS, A2GS, BETS, BETA2SER, GAMS, MSPIKE, SPEI  No results found for: KPAFRELGTCHN, LAMBDASER, [facility]  Lab Results  Component Value Date   WBC 3.7 (L) 12/03/2018   NEUTROABS 2.4 12/03/2018   HGB 11.2 (L) 12/03/2018   HCT 35.3 (L) 12/03/2018   MCV 84.0 12/03/2018   PLT 153 12/03/2018    '@LASTCHEMISTRY'$ @  No results found for: LABCA2  No components found for: [id number]  No results for input(s): INR in the last 168 hours.  No results found for: LABCA2  No results found for: GYB638  No results found for: LHT342  No results found for: AJG811  No results found for: CA2729  No components found for: HGQUANT  No results found for: CEA1 / No results found for: CEA1   No results found for: AFPTUMOR  No results found for: CHROMOGRNA  No results found for: PSA1  Appointment on 12/03/2018  Component Date Value Ref Range Status  . WBC 12/03/2018 3.7* 4.0 - 10.5 K/uL Final  . RBC 12/03/2018 4.20  3.87 - 5.11 MIL/uL Final  . Hemoglobin 12/03/2018 11.2* 12.0 - 15.0 g/dL Final  . HCT 12/03/2018 35.3* 36.0 - 46.0 % Final  . MCV 12/03/2018 84.0  80.0 - 100.0 fL Final  . MCH 12/03/2018 26.7  26.0 - 34.0 pg Final  . MCHC 12/03/2018 31.7  30.0 - 36.0 g/dL Final  . RDW 12/03/2018 15.3  11.5 - 15.5 % Final  . Platelets 12/03/2018 153  150 - 400 K/uL Final  . nRBC 12/03/2018 0.0  0.0 - 0.2 % Final  .  Neutrophils Relative % 12/03/2018 65  % Final  . Neutro Abs 12/03/2018 2.4  1.7 - 7.7 K/uL Final  . Lymphocytes Relative 12/03/2018 19  % Final  . Lymphs Abs 12/03/2018 0.7  0.7 - 4.0 K/uL Final  . Monocytes Relative 12/03/2018 13  % Final  . Monocytes Absolute 12/03/2018 0.5  0.1 - 1.0 K/uL Final  . Eosinophils Relative 12/03/2018 2  % Final  . Eosinophils Absolute 12/03/2018 0.1  0.0 - 0.5 K/uL Final  . Basophils Relative 12/03/2018 1  % Final  . Basophils Absolute 12/03/2018 0.0  0.0 - 0.1 K/uL Final  . Immature Granulocytes 12/03/2018 0  % Final  . Abs Immature Granulocytes 12/03/2018 0.01  0.00 - 0.07 K/uL Final   Performed at [facility], [place] [address] [place], [place] [address]  . Sodium 12/03/2018 138  135 - 145 mmol/L Final  . Potassium 12/03/2018 3.9  3.5 - 5.1 mmol/L Final  . Chloride 12/03/2018 103  98 - 111 mmol/L Final  . CO2 12/03/2018 28  22 - 32 mmol/L Final  . Glucose, Bld 12/03/2018 97  70 - 99 mg/dL Final  . BUN 12/03/2018 30* 6 - 20 mg/dL Final  . Creatinine, Ser 12/03/2018 1.36* 0.44 - 1.00 mg/dL Final  . Calcium 12/03/2018 11.0* 8.9 - 10.3 mg/dL Final  . Total Protein 12/03/2018 7.1  6.5 - 8.1 g/dL Final  . Albumin 12/03/2018 3.4* 3.5 - 5.0 g/dL Final  . AST 12/03/2018 15  15 - 41 U/L Final  . ALT 12/03/2018 18  0 - 44 U/L Final  . Alkaline Phosphatase 12/03/2018 115  38 - 126 U/L Final  . Total Bilirubin 12/03/2018 0.3  0.3 - 1.2  mg/dL Final  . GFR calc non Af Amer 12/03/2018 46* >60 mL/min Final  . GFR calc Af Amer 12/03/2018 54* >60 mL/min Final  . Anion gap 12/03/2018 7  5 - 15 Final   Performed at [facility], [place] [place], [place] [address]    (this displays the last labs from the last 3 days)  No results found for: TOTALPROTELP, ALBUMINELP, A1GS, A2GS, BETS, BETA2SER, GAMS, MSPIKE, SPEI (this displays SPEP labs)  No results found for: KPAFRELGTCHN, LAMBDASER, KAPLAMBRATIO (kappa/lambda  light chains)  No results found for: HGBA, HGBA2QUANT, HGBFQUANT, HGBSQUAN (Hemoglobinopathy evaluation)   No results found for: LDH  No results found for: IRON, TIBC, IRONPCTSAT (Iron and TIBC)  No results found for: FERRITIN  Urinalysis    Component Value Date/Time   COLORURINE STRAW (A) 03/17/2017 0600   APPEARANCEUR CLEAR (A) 03/17/2017 0600   LABSPEC 1.031 (H) 03/17/2017 0600   PHURINE 5.0 03/17/2017 0600   GLUCOSEU NEGATIVE 03/17/2017 0600   HGBUR NEGATIVE 03/17/2017 0600   BILIRUBINUR NEGATIVE 03/17/2017 0600   KETONESUR NEGATIVE 03/17/2017 0600   PROTEINUR NEGATIVE 03/17/2017 0600   UROBILINOGEN 0.2 11/29/2011 1656   NITRITE NEGATIVE 03/17/2017 0600   LEUKOCYTESUR NEGATIVE 03/17/2017 0600     STUDIES: Echo results discussed with the patient  ELIGIBLE FOR AVAILABLE RESEARCH PROTOCOL: no  ASSESSMENT: 48 y.o. [place], [place] woman  (1) status post left lumpectomy in 2000 for a (?) T2N0 breast cancer,  (a) status post adjuvant chemotherapy  (b) status post adjuvant radiation  (c) did not receive antiestrogens  (2) genetics testing May 18, 2018 through the common Hereditary Cancers Panel + Myelodysplastic Syndrome/Leukemia Panel found no deleterious mutations in APC, ATM, AXIN2, BARD1, BLM, BMPR1A, BRCA1, BRCA2, BRIP1, CDH1, CDK4, CDKN2A (p14ARF), CDKN2A (p16INK4a), CEBPA, CHEK2, CTNNA1, DICER1, EPCAM*, GATA2, GREM1*, HRAS, KIT, MEN1, MLH1, MSH2, MSH3, MSH6, MUTYH, NBN, NF1, PALB2, PDGFRA, PMS2, POLD1, POLE, PTEN, RAD50, RAD51C, RAD51D, RUNX1, SDHB, SDHC, SDHD, SMAD4, SMARCA4, STK11, TERC, TERT, TP53, TSC1, TSC2, VHL The following genes were evaluated for sequence changes only: HOXB13*, NTHL1*, SDHA  (a)  3 variants of uncertain significance were identified in the genes BARD1 c.764A>G (p.Asn255Ser), BRIP1 c.2563C>T (p.Arg855Cys), and PALB2 c.3103A>T (p.Ile1035Phe).   (3) status post right breast biopsy 03/16/2018 for a clinical mT2-3 N2, anatomic stage III invasive  ductal carcinoma, grade 2, triple positive, with an MIB-1 of 80%.  (a) bone scan and chest CT scan negative for metastases except for a thoracic spine lytic lesion of concern  (b) thoracic spine MRI was ordered May 2019 but never performed.  (4) neoadjuvant chemotherapy consisting of carboplatin, docetaxel, trastuzumab and Pertuzumab every 3 weeks x 6 starting 04/16/2018  (a) pertuzumab held beginning with cycle 2 because of diarrhea  (5) trastuzumab to be continued to a total of 1 year  (a) echocardiogram on 06/29/2018 showed an ejection fraction in the 65-70% range  (b) echocardiogram 11/19/2018 showed an ejection fraction in the 60-65% range  (6) right mastectomy and sentinel lymph node sampling on 08/26/2018 found a residual 2.5cm invasive ductal carcinoma, with 2 of 5 sentinel lymph nodes positive, ypT2, ypN1a; margins were clear  (7) adjuvant radiation started 10/02/2018  (8) anastrozole started 12/03/2018  (a) [place] on 10/01/2018 was 64.3, and estradiol 7.0, consistent with menopause  (b) referral to pelvic floor rehabilitation 12/03/2018  PLAN: [name] has completed her radiation treatments and is now ready to consider adjuvant antiestrogens.  She is still recovering some from her adjuvant radiation.  Fatigue  is the prominent symptom.  That should improve with exercise.  She is having some menopausal symptoms particularly with decreased libido and vaginal atrophy.  I am referring her to our pelvic health program which I think will be helpful in that regard.  She is now ready to start antiestrogens.  We are doing anastrozole and she has a good understanding of the possible toxicity side effects and complications of this agent.  We are checking with [facility] to see when she had her last bone density.  Reviewing her chart she had a lytic lesion in the mid thoracic spine which was not noted on bone scan and which may not be related.  We had ordered an MRI of this area but that was never done.   We will discuss that again at the next visit here.  She does have some residual peripheral neuropathy involving the toes.  She does not think this warrants gabapentin although she does have that available.  She knows to call for any other issue that may develop before her next visit here.  Wilber Bihari, NP  12/03/18 11:16 AM Medical Oncology and Hematology [facility] [address] [place], [place] [address] Tel. [phone]    Fax. [phone]   I, Wilburn Mylar am acting as scribe for Dr. Virgie Dad. Magrinat.  I, Lurline Del MD, have reviewed the above documentation for accuracy and completeness, and I agree with the above.

## 2018-12-03 NOTE — Patient Instructions (Signed)
[facility] Discharge Instructions for Patients Receiving Chemotherapy  Today you received the following chemotherapy agents Herceptin  To help prevent nausea and vomiting after your treatment, we encourage you to take your nausea medication as directed   If you develop nausea and vomiting that is not controlled by your nausea medication, call the clinic.   BELOW ARE SYMPTOMS THAT SHOULD BE REPORTED IMMEDIATELY:  *FEVER GREATER THAN 100.5 F  *CHILLS WITH OR WITHOUT FEVER  NAUSEA AND VOMITING THAT IS NOT CONTROLLED WITH YOUR NAUSEA MEDICATION  *UNUSUAL SHORTNESS OF BREATH  *UNUSUAL BRUISING OR BLEEDING  TENDERNESS IN MOUTH AND THROAT WITH OR WITHOUT PRESENCE OF ULCERS  *URINARY PROBLEMS  *BOWEL PROBLEMS  UNUSUAL RASH Items with * indicate a potential emergency and should be followed up as soon as possible.  Feel free to call the clinic should you have any questions or concerns. The clinic phone number is [phone].  Please show the CHEMO ALERT CARD at check-in to the Emergency Department and triage nurse.   

## 2018-12-10 ENCOUNTER — Ambulatory Visit: Payer: 59

## 2018-12-10 ENCOUNTER — Other Ambulatory Visit: Payer: 59

## 2018-12-10 ENCOUNTER — Ambulatory Visit: Payer: 59 | Admitting: Oncology

## 2018-12-24 ENCOUNTER — Inpatient Hospital Stay: Payer: BLUE CROSS/BLUE SHIELD

## 2018-12-24 VITALS — BP 133/69 | HR 71 | Temp 99.7°F | Resp 18

## 2018-12-24 DIAGNOSIS — Z170 Estrogen receptor positive status [ER+]: Secondary | ICD-10-CM

## 2018-12-24 DIAGNOSIS — C50411 Malignant neoplasm of upper-outer quadrant of right female breast: Secondary | ICD-10-CM

## 2018-12-24 DIAGNOSIS — Z95828 Presence of other vascular implants and grafts: Secondary | ICD-10-CM

## 2018-12-24 DIAGNOSIS — C50811 Malignant neoplasm of overlapping sites of right female breast: Secondary | ICD-10-CM | POA: Diagnosis not present

## 2018-12-24 DIAGNOSIS — Z5112 Encounter for antineoplastic immunotherapy: Secondary | ICD-10-CM | POA: Diagnosis not present

## 2018-12-24 LAB — CBC WITH DIFFERENTIAL/PLATELET
Abs Immature Granulocytes: 0.03 10*3/uL (ref 0.00–0.07)
Basophils Absolute: 0 10*3/uL (ref 0.0–0.1)
Basophils Relative: 0 %
Eosinophils Absolute: 0.1 10*3/uL (ref 0.0–0.5)
Eosinophils Relative: 2 %
HCT: 35 % — ABNORMAL LOW (ref 36.0–46.0)
Hemoglobin: 11.4 g/dL — ABNORMAL LOW (ref 12.0–15.0)
Immature Granulocytes: 0 %
Lymphocytes Relative: 12 %
Lymphs Abs: 0.8 10*3/uL (ref 0.7–4.0)
MCH: 26.5 pg (ref 26.0–34.0)
MCHC: 32.6 g/dL (ref 30.0–36.0)
MCV: 81.2 fL (ref 80.0–100.0)
Monocytes Absolute: 0.9 10*3/uL (ref 0.1–1.0)
Monocytes Relative: 12 %
Neutro Abs: 5.1 10*3/uL (ref 1.7–7.7)
Neutrophils Relative %: 74 %
Platelets: 157 10*3/uL (ref 150–400)
RBC: 4.31 MIL/uL (ref 3.87–5.11)
RDW: 16 % — ABNORMAL HIGH (ref 11.5–15.5)
WBC: 7 10*3/uL (ref 4.0–10.5)
nRBC: 0 % (ref 0.0–0.2)

## 2018-12-24 LAB — COMPREHENSIVE METABOLIC PANEL
ALT: 18 U/L (ref 0–44)
AST: 15 U/L (ref 15–41)
Albumin: 3.5 g/dL (ref 3.5–5.0)
Alkaline Phosphatase: 118 U/L (ref 38–126)
Anion gap: 9 (ref 5–15)
BUN: 23 mg/dL — ABNORMAL HIGH (ref 6–20)
CO2: 27 mmol/L (ref 22–32)
Calcium: 11.3 mg/dL — ABNORMAL HIGH (ref 8.9–10.3)
Chloride: 104 mmol/L (ref 98–111)
Creatinine, Ser: 1.43 mg/dL — ABNORMAL HIGH (ref 0.44–1.00)
GFR calc Af Amer: 50 mL/min — ABNORMAL LOW (ref >60)
GFR calc non Af Amer: 44 mL/min — ABNORMAL LOW (ref >60)
Glucose, Bld: 93 mg/dL (ref 70–99)
Potassium: 4.1 mmol/L (ref 3.5–5.1)
Sodium: 140 mmol/L (ref 135–145)
Total Bilirubin: 0.4 mg/dL (ref 0.3–1.2)
Total Protein: 7.4 g/dL (ref 6.5–8.1)

## 2018-12-24 MED ORDER — TRASTUZUMAB CHEMO 150 MG IV SOLR
6.0000 mg/kg | Freq: Once | INTRAVENOUS | Status: AC
  Administered 2018-12-24: 588 mg via INTRAVENOUS
  Filled 2018-12-24: qty 28

## 2018-12-24 MED ORDER — SODIUM CHLORIDE 0.9% FLUSH
10.0000 mL | INTRAVENOUS | Status: DC | PRN
  Administered 2018-12-24: 10 mL
  Filled 2018-12-24: qty 10

## 2018-12-24 MED ORDER — HEPARIN SOD (PORK) LOCK FLUSH 100 UNIT/ML IV SOLN
500.0000 [IU] | Freq: Once | INTRAVENOUS | Status: AC | PRN
  Administered 2018-12-24: 500 [IU]
  Filled 2018-12-24: qty 5

## 2018-12-24 MED ORDER — SODIUM CHLORIDE 0.9% FLUSH
10.0000 mL | Freq: Once | INTRAVENOUS | Status: AC
  Administered 2018-12-24: 10 mL
  Filled 2018-12-24: qty 10

## 2018-12-24 MED ORDER — DIPHENHYDRAMINE HCL 25 MG PO CAPS
ORAL_CAPSULE | ORAL | Status: AC
  Filled 2018-12-24: qty 1

## 2018-12-24 MED ORDER — ACETAMINOPHEN 325 MG PO TABS
ORAL_TABLET | ORAL | Status: AC
  Filled 2018-12-24: qty 2

## 2018-12-24 MED ORDER — ACETAMINOPHEN 325 MG PO TABS
650.0000 mg | ORAL_TABLET | Freq: Once | ORAL | Status: AC
  Administered 2018-12-24: 650 mg via ORAL

## 2018-12-24 MED ORDER — DIPHENHYDRAMINE HCL 25 MG PO CAPS
25.0000 mg | ORAL_CAPSULE | Freq: Once | ORAL | Status: AC
  Administered 2018-12-24: 25 mg via ORAL

## 2018-12-24 MED ORDER — SODIUM CHLORIDE 0.9 % IV SOLN
Freq: Once | INTRAVENOUS | Status: AC
  Administered 2018-12-24: 14:00:00 via INTRAVENOUS
  Filled 2018-12-24: qty 250

## 2018-12-24 NOTE — Patient Instructions (Signed)
[facility] Discharge Instructions for Patients Receiving Chemotherapy  Today you received the following chemotherapy agents Herceptin  To help prevent nausea and vomiting after your treatment, we encourage you to take your nausea medication as directed   If you develop nausea and vomiting that is not controlled by your nausea medication, call the clinic.   BELOW ARE SYMPTOMS THAT SHOULD BE REPORTED IMMEDIATELY:  *FEVER GREATER THAN 100.5 F  *CHILLS WITH OR WITHOUT FEVER  NAUSEA AND VOMITING THAT IS NOT CONTROLLED WITH YOUR NAUSEA MEDICATION  *UNUSUAL SHORTNESS OF BREATH  *UNUSUAL BRUISING OR BLEEDING  TENDERNESS IN MOUTH AND THROAT WITH OR WITHOUT PRESENCE OF ULCERS  *URINARY PROBLEMS  *BOWEL PROBLEMS  UNUSUAL RASH Items with * indicate a potential emergency and should be followed up as soon as possible.  Feel free to call the clinic should you have any questions or concerns. The clinic phone number is [phone].  Please show the CHEMO ALERT CARD at check-in to the Emergency Department and triage nurse.   

## 2018-12-25 ENCOUNTER — Telehealth: Payer: Self-pay | Admitting: *Deleted

## 2018-12-25 LAB — FOLLICLE STIMULATING HORMONE: FSH: 63.8 m[IU]/mL

## 2018-12-25 NOTE — Telephone Encounter (Signed)
This RN returned call and obtained VM. Message left requesting a return call for further discussion.  Symptoms occurring are likely not related to the herceptin but need to discuss due to concern with pt has port.  She also may have a stomach virus that occurred coincidently after receiving the herceptin.  Will await call for further discussion.

## 2018-12-25 NOTE — Telephone Encounter (Signed)
"  [name] [name] [phone]).   Chills last night after Herceptin.  Still having chills today.   Last night 7:30 pm Temp = 101.6  F.  At 7:45 am  T = 100.3 F.   Bones ache; especially hips and legs.  Stomach feels twisted/unsettled.  Threw up at work once.  Looked like toast, egs and blueberries I ate for breakfast.  Ate chicken ceasar salad for dinner.   Very concerned with symptoms.  Received Herceptin a long time never felt side effects before.  This has caught me off guard.   I am at work. Would like to stay at work but will come in if needed."

## 2018-12-25 NOTE — Telephone Encounter (Signed)
On 12-25-18 fax medical records to [facility]

## 2018-12-26 LAB — ESTRADIOL, ULTRA SENS: Estradiol, Sensitive: 6.4 pg/mL

## 2018-12-28 DIAGNOSIS — J029 Acute pharyngitis, unspecified: Secondary | ICD-10-CM | POA: Diagnosis not present

## 2019-01-11 ENCOUNTER — Ambulatory Visit: Payer: BLUE CROSS/BLUE SHIELD | Attending: Radiation Oncology | Admitting: Radiation Oncology

## 2019-01-13 NOTE — Progress Notes (Signed)
No show

## 2019-01-14 ENCOUNTER — Other Ambulatory Visit: Payer: 59

## 2019-01-14 ENCOUNTER — Inpatient Hospital Stay: Payer: BLUE CROSS/BLUE SHIELD | Admitting: Oncology

## 2019-01-14 ENCOUNTER — Inpatient Hospital Stay: Payer: BLUE CROSS/BLUE SHIELD

## 2019-01-14 ENCOUNTER — Inpatient Hospital Stay: Payer: BLUE CROSS/BLUE SHIELD | Attending: Oncology

## 2019-01-14 ENCOUNTER — Encounter: Payer: Self-pay | Admitting: Oncology

## 2019-01-14 ENCOUNTER — Telehealth: Payer: Self-pay | Admitting: *Deleted

## 2019-01-14 DIAGNOSIS — C50411 Malignant neoplasm of upper-outer quadrant of right female breast: Secondary | ICD-10-CM | POA: Insufficient documentation

## 2019-01-14 DIAGNOSIS — Z5112 Encounter for antineoplastic immunotherapy: Secondary | ICD-10-CM | POA: Insufficient documentation

## 2019-01-14 DIAGNOSIS — C50811 Malignant neoplasm of overlapping sites of right female breast: Secondary | ICD-10-CM | POA: Insufficient documentation

## 2019-01-14 DIAGNOSIS — Z170 Estrogen receptor positive status [ER+]: Secondary | ICD-10-CM | POA: Insufficient documentation

## 2019-01-14 NOTE — Telephone Encounter (Signed)
This RN received VM from pt stating she is unable to come in for visit today due to her son has Strep throat.  She is calling to reschedule.  This RN sent an HIGH request to reschedule herceptin scheduled for today.

## 2019-01-15 ENCOUNTER — Telehealth: Payer: Self-pay | Admitting: Hematology and Oncology

## 2019-01-15 NOTE — Telephone Encounter (Signed)
Called patient per 2/14 sch message - left message for patient with appt date and time

## 2019-01-18 ENCOUNTER — Inpatient Hospital Stay: Payer: BLUE CROSS/BLUE SHIELD

## 2019-01-18 ENCOUNTER — Other Ambulatory Visit: Payer: Self-pay | Admitting: Oncology

## 2019-01-18 VITALS — BP 108/81 | HR 57 | Temp 98.4°F | Resp 14 | Wt 215.0 lb

## 2019-01-18 DIAGNOSIS — Z170 Estrogen receptor positive status [ER+]: Secondary | ICD-10-CM | POA: Diagnosis not present

## 2019-01-18 DIAGNOSIS — C50411 Malignant neoplasm of upper-outer quadrant of right female breast: Secondary | ICD-10-CM

## 2019-01-18 DIAGNOSIS — C50811 Malignant neoplasm of overlapping sites of right female breast: Secondary | ICD-10-CM | POA: Diagnosis not present

## 2019-01-18 DIAGNOSIS — Z5112 Encounter for antineoplastic immunotherapy: Secondary | ICD-10-CM | POA: Diagnosis not present

## 2019-01-18 LAB — CBC WITH DIFFERENTIAL/PLATELET
Abs Immature Granulocytes: 0.02 10*3/uL (ref 0.00–0.07)
Basophils Absolute: 0 10*3/uL (ref 0.0–0.1)
Basophils Relative: 0 %
Eosinophils Absolute: 0.1 10*3/uL (ref 0.0–0.5)
Eosinophils Relative: 2 %
HCT: 34.5 % — ABNORMAL LOW (ref 36.0–46.0)
Hemoglobin: 11.2 g/dL — ABNORMAL LOW (ref 12.0–15.0)
Immature Granulocytes: 0 %
Lymphocytes Relative: 20 %
Lymphs Abs: 1 10*3/uL (ref 0.7–4.0)
MCH: 26.3 pg (ref 26.0–34.0)
MCHC: 32.5 g/dL (ref 30.0–36.0)
MCV: 81 fL (ref 80.0–100.0)
Monocytes Absolute: 0.5 10*3/uL (ref 0.1–1.0)
Monocytes Relative: 11 %
Neutro Abs: 3.3 10*3/uL (ref 1.7–7.7)
Neutrophils Relative %: 67 %
Platelets: 159 10*3/uL (ref 150–400)
RBC: 4.26 MIL/uL (ref 3.87–5.11)
RDW: 16.9 % — ABNORMAL HIGH (ref 11.5–15.5)
WBC: 4.9 10*3/uL (ref 4.0–10.5)
nRBC: 0 % (ref 0.0–0.2)

## 2019-01-18 LAB — COMPREHENSIVE METABOLIC PANEL
ALT: 23 U/L (ref 0–44)
AST: 19 U/L (ref 15–41)
Albumin: 3.7 g/dL (ref 3.5–5.0)
Alkaline Phosphatase: 119 U/L (ref 38–126)
Anion gap: 11 (ref 5–15)
BUN: 35 mg/dL — ABNORMAL HIGH (ref 6–20)
CO2: 24 mmol/L (ref 22–32)
Calcium: 11.4 mg/dL — ABNORMAL HIGH (ref 8.9–10.3)
Chloride: 104 mmol/L (ref 98–111)
Creatinine, Ser: 1.47 mg/dL — ABNORMAL HIGH (ref 0.44–1.00)
GFR calc Af Amer: 49 mL/min — ABNORMAL LOW (ref >60)
GFR calc non Af Amer: 42 mL/min — ABNORMAL LOW (ref >60)
Glucose, Bld: 97 mg/dL (ref 70–99)
Potassium: 4.1 mmol/L (ref 3.5–5.1)
Sodium: 139 mmol/L (ref 135–145)
Total Bilirubin: 0.4 mg/dL (ref 0.3–1.2)
Total Protein: 7.7 g/dL (ref 6.5–8.1)

## 2019-01-18 MED ORDER — SODIUM CHLORIDE 0.9% FLUSH
10.0000 mL | INTRAVENOUS | Status: DC | PRN
  Administered 2019-01-18: 10 mL
  Filled 2019-01-18: qty 10

## 2019-01-18 MED ORDER — ACETAMINOPHEN 325 MG PO TABS
ORAL_TABLET | ORAL | Status: AC
  Filled 2019-01-18: qty 2

## 2019-01-18 MED ORDER — TRASTUZUMAB CHEMO 150 MG IV SOLR
6.0000 mg/kg | Freq: Once | INTRAVENOUS | Status: AC
  Administered 2019-01-18: 588 mg via INTRAVENOUS
  Filled 2019-01-18: qty 28

## 2019-01-18 MED ORDER — HEPARIN SOD (PORK) LOCK FLUSH 100 UNIT/ML IV SOLN
500.0000 [IU] | Freq: Once | INTRAVENOUS | Status: AC | PRN
  Administered 2019-01-18: 500 [IU]
  Filled 2019-01-18: qty 5

## 2019-01-18 MED ORDER — SODIUM CHLORIDE 0.9 % IV SOLN
Freq: Once | INTRAVENOUS | Status: AC
  Administered 2019-01-18: 10:00:00 via INTRAVENOUS
  Filled 2019-01-18: qty 250

## 2019-01-18 MED ORDER — ACETAMINOPHEN 325 MG PO TABS
650.0000 mg | ORAL_TABLET | Freq: Once | ORAL | Status: AC
  Administered 2019-01-18: 650 mg via ORAL

## 2019-01-18 MED ORDER — DIPHENHYDRAMINE HCL 25 MG PO CAPS
25.0000 mg | ORAL_CAPSULE | Freq: Once | ORAL | Status: AC
  Administered 2019-01-18: 25 mg via ORAL

## 2019-01-18 MED ORDER — DIPHENHYDRAMINE HCL 25 MG PO CAPS
ORAL_CAPSULE | ORAL | Status: AC
  Filled 2019-01-18: qty 1

## 2019-01-18 NOTE — Patient Instructions (Signed)
[facility] Discharge Instructions for Patients Receiving Chemotherapy  Today you received the following chemotherapy agents Herceptin  To help prevent nausea and vomiting after your treatment, we encourage you to take your nausea medication as directed   If you develop nausea and vomiting that is not controlled by your nausea medication, call the clinic.   BELOW ARE SYMPTOMS THAT SHOULD BE REPORTED IMMEDIATELY:  *FEVER GREATER THAN 100.5 F  *CHILLS WITH OR WITHOUT FEVER  NAUSEA AND VOMITING THAT IS NOT CONTROLLED WITH YOUR NAUSEA MEDICATION  *UNUSUAL SHORTNESS OF BREATH  *UNUSUAL BRUISING OR BLEEDING  TENDERNESS IN MOUTH AND THROAT WITH OR WITHOUT PRESENCE OF ULCERS  *URINARY PROBLEMS  *BOWEL PROBLEMS  UNUSUAL RASH Items with * indicate a potential emergency and should be followed up as soon as possible.  Feel free to call the clinic should you have any questions or concerns. The clinic phone number is [phone].  Please show the CHEMO ALERT CARD at check-in to the Emergency Department and triage nurse.   

## 2019-02-02 DIAGNOSIS — Z170 Estrogen receptor positive status [ER+]: Secondary | ICD-10-CM | POA: Diagnosis not present

## 2019-02-02 DIAGNOSIS — C50411 Malignant neoplasm of upper-outer quadrant of right female breast: Secondary | ICD-10-CM | POA: Diagnosis not present

## 2019-02-02 DIAGNOSIS — R221 Localized swelling, mass and lump, neck: Secondary | ICD-10-CM | POA: Diagnosis not present

## 2019-02-03 ENCOUNTER — Other Ambulatory Visit: Payer: Self-pay | Admitting: Student

## 2019-02-03 ENCOUNTER — Telehealth: Payer: Self-pay | Admitting: *Deleted

## 2019-02-03 ENCOUNTER — Ambulatory Visit
Admission: RE | Admit: 2019-02-03 | Discharge: 2019-02-03 | Disposition: A | Discharge status: Home / Self Care | Payer: BLUE CROSS/BLUE SHIELD | Source: Ambulatory Visit | Attending: Student | Admitting: Student

## 2019-02-03 DIAGNOSIS — R221 Localized swelling, mass and lump, neck: Secondary | ICD-10-CM

## 2019-02-03 DIAGNOSIS — Z853 Personal history of malignant neoplasm of breast: Secondary | ICD-10-CM

## 2019-02-03 DIAGNOSIS — R590 Localized enlarged lymph nodes: Secondary | ICD-10-CM

## 2019-02-03 NOTE — Telephone Encounter (Signed)
"  [name] [name] with General Surgery Team calling to request pt.'s Port-a-cath remain accessed post treatment tomorrow.  Scheduling scans at [facility] for [name] [name] tomorrow.  Very difficult IV access.  After scans she will need to return to [facility] to be de-accessed."  Surveyor, quantity notified and authorized this request.  [facility] may be scheduled morning of 02-05-2019 if scan not completed before [facility] infusion closing.

## 2019-02-04 ENCOUNTER — Inpatient Hospital Stay: Payer: BLUE CROSS/BLUE SHIELD | Attending: Oncology

## 2019-02-04 ENCOUNTER — Other Ambulatory Visit: Payer: Self-pay | Admitting: Student

## 2019-02-04 ENCOUNTER — Inpatient Hospital Stay: Payer: BLUE CROSS/BLUE SHIELD

## 2019-02-04 ENCOUNTER — Ambulatory Visit
Admission: RE | Admit: 2019-02-04 | Discharge: 2019-02-04 | Disposition: A | Discharge status: Home / Self Care | Payer: BLUE CROSS/BLUE SHIELD | Source: Ambulatory Visit | Attending: Student | Admitting: Student

## 2019-02-04 VITALS — BP 155/77 | HR 62 | Temp 98.1°F | Resp 18

## 2019-02-04 DIAGNOSIS — R221 Localized swelling, mass and lump, neck: Secondary | ICD-10-CM

## 2019-02-04 DIAGNOSIS — Z170 Estrogen receptor positive status [ER+]: Secondary | ICD-10-CM

## 2019-02-04 DIAGNOSIS — R590 Localized enlarged lymph nodes: Secondary | ICD-10-CM

## 2019-02-04 DIAGNOSIS — Z853 Personal history of malignant neoplasm of breast: Secondary | ICD-10-CM

## 2019-02-04 DIAGNOSIS — C50811 Malignant neoplasm of overlapping sites of right female breast: Secondary | ICD-10-CM | POA: Insufficient documentation

## 2019-02-04 DIAGNOSIS — Z5112 Encounter for antineoplastic immunotherapy: Secondary | ICD-10-CM | POA: Insufficient documentation

## 2019-02-04 DIAGNOSIS — M25511 Pain in right shoulder: Secondary | ICD-10-CM | POA: Diagnosis not present

## 2019-02-04 DIAGNOSIS — C50411 Malignant neoplasm of upper-outer quadrant of right female breast: Secondary | ICD-10-CM

## 2019-02-04 DIAGNOSIS — M7989 Other specified soft tissue disorders: Secondary | ICD-10-CM | POA: Diagnosis not present

## 2019-02-04 DIAGNOSIS — R599 Enlarged lymph nodes, unspecified: Secondary | ICD-10-CM | POA: Diagnosis not present

## 2019-02-04 DIAGNOSIS — Z95828 Presence of other vascular implants and grafts: Secondary | ICD-10-CM

## 2019-02-04 LAB — COMPREHENSIVE METABOLIC PANEL
ALT: 29 U/L (ref 0–44)
AST: 24 U/L (ref 15–41)
Albumin: 3.5 g/dL (ref 3.5–5.0)
Alkaline Phosphatase: 120 U/L (ref 38–126)
Anion gap: 8 (ref 5–15)
BUN: 34 mg/dL — ABNORMAL HIGH (ref 6–20)
CO2: 22 mmol/L (ref 22–32)
Calcium: 10.4 mg/dL — ABNORMAL HIGH (ref 8.9–10.3)
Chloride: 106 mmol/L (ref 98–111)
Creatinine, Ser: 1.4 mg/dL — ABNORMAL HIGH (ref 0.44–1.00)
GFR calc Af Amer: 52 mL/min — ABNORMAL LOW (ref >60)
GFR calc non Af Amer: 45 mL/min — ABNORMAL LOW (ref >60)
Glucose, Bld: 81 mg/dL (ref 70–99)
Potassium: 4.2 mmol/L (ref 3.5–5.1)
Sodium: 136 mmol/L (ref 135–145)
Total Bilirubin: 0.3 mg/dL (ref 0.3–1.2)
Total Protein: 7.4 g/dL (ref 6.5–8.1)

## 2019-02-04 LAB — CBC WITH DIFFERENTIAL/PLATELET
Abs Immature Granulocytes: 0.02 10*3/uL (ref 0.00–0.07)
Basophils Absolute: 0 10*3/uL (ref 0.0–0.1)
Basophils Relative: 1 %
Eosinophils Absolute: 0.1 10*3/uL (ref 0.0–0.5)
Eosinophils Relative: 3 %
HCT: 33.6 % — ABNORMAL LOW (ref 36.0–46.0)
Hemoglobin: 10.7 g/dL — ABNORMAL LOW (ref 12.0–15.0)
Immature Granulocytes: 0 %
Lymphocytes Relative: 21 %
Lymphs Abs: 0.9 10*3/uL (ref 0.7–4.0)
MCH: 26.5 pg (ref 26.0–34.0)
MCHC: 31.8 g/dL (ref 30.0–36.0)
MCV: 83.2 fL (ref 80.0–100.0)
Monocytes Absolute: 0.7 10*3/uL (ref 0.1–1.0)
Monocytes Relative: 15 %
Neutro Abs: 2.7 10*3/uL (ref 1.7–7.7)
Neutrophils Relative %: 60 %
Platelets: 158 10*3/uL (ref 150–400)
RBC: 4.04 MIL/uL (ref 3.87–5.11)
RDW: 17.5 % — ABNORMAL HIGH (ref 11.5–15.5)
WBC: 4.5 10*3/uL (ref 4.0–10.5)
nRBC: 0 % (ref 0.0–0.2)

## 2019-02-04 MED ORDER — DIPHENHYDRAMINE HCL 25 MG PO CAPS
25.0000 mg | ORAL_CAPSULE | Freq: Once | ORAL | Status: AC
  Administered 2019-02-04: 25 mg via ORAL

## 2019-02-04 MED ORDER — IOPAMIDOL (ISOVUE-370) INJECTION 76%
50.0000 mL | Freq: Once | INTRAVENOUS | Status: AC | PRN
  Administered 2019-02-04: 100 mL via INTRAVENOUS

## 2019-02-04 MED ORDER — SODIUM CHLORIDE 0.9% FLUSH
10.0000 mL | INTRAVENOUS | Status: DC | PRN
  Administered 2019-02-04: 10 mL
  Filled 2019-02-04: qty 10

## 2019-02-04 MED ORDER — ACETAMINOPHEN 325 MG PO TABS
650.0000 mg | ORAL_TABLET | Freq: Once | ORAL | Status: AC
  Administered 2019-02-04: 650 mg via ORAL

## 2019-02-04 MED ORDER — TRASTUZUMAB CHEMO 150 MG IV SOLR
6.0000 mg/kg | Freq: Once | INTRAVENOUS | Status: AC
  Administered 2019-02-04: 588 mg via INTRAVENOUS
  Filled 2019-02-04: qty 28

## 2019-02-04 MED ORDER — DIPHENHYDRAMINE HCL 25 MG PO CAPS
ORAL_CAPSULE | ORAL | Status: AC
  Filled 2019-02-04: qty 1

## 2019-02-04 MED ORDER — HEPARIN SOD (PORK) LOCK FLUSH 100 UNIT/ML IV SOLN
500.0000 [IU] | Freq: Once | INTRAVENOUS | Status: DC | PRN
  Filled 2019-02-04: qty 5

## 2019-02-04 MED ORDER — ACETAMINOPHEN 325 MG PO TABS
ORAL_TABLET | ORAL | Status: AC
  Filled 2019-02-04: qty 2

## 2019-02-04 MED ORDER — SODIUM CHLORIDE 0.9% FLUSH
10.0000 mL | Freq: Once | INTRAVENOUS | Status: AC
  Administered 2019-02-04: 10 mL
  Filled 2019-02-04: qty 10

## 2019-02-04 MED ORDER — SODIUM CHLORIDE 0.9 % IV SOLN
Freq: Once | INTRAVENOUS | Status: AC
  Administered 2019-02-04: 14:00:00 via INTRAVENOUS
  Filled 2019-02-04: qty 250

## 2019-02-05 ENCOUNTER — Inpatient Hospital Stay: Payer: BLUE CROSS/BLUE SHIELD

## 2019-02-05 DIAGNOSIS — C50411 Malignant neoplasm of upper-outer quadrant of right female breast: Secondary | ICD-10-CM

## 2019-02-05 DIAGNOSIS — Z5112 Encounter for antineoplastic immunotherapy: Secondary | ICD-10-CM | POA: Diagnosis not present

## 2019-02-05 DIAGNOSIS — Z95828 Presence of other vascular implants and grafts: Secondary | ICD-10-CM

## 2019-02-05 DIAGNOSIS — C50811 Malignant neoplasm of overlapping sites of right female breast: Secondary | ICD-10-CM | POA: Diagnosis not present

## 2019-02-05 DIAGNOSIS — Z170 Estrogen receptor positive status [ER+]: Secondary | ICD-10-CM

## 2019-02-05 MED ORDER — SODIUM CHLORIDE 0.9% FLUSH
10.0000 mL | Freq: Once | INTRAVENOUS | Status: DC
  Filled 2019-02-05: qty 10

## 2019-02-05 MED ORDER — HEPARIN SOD (PORK) LOCK FLUSH 100 UNIT/ML IV SOLN
250.0000 [IU] | Freq: Once | INTRAVENOUS | Status: DC
  Filled 2019-02-05: qty 5

## 2019-02-08 ENCOUNTER — Other Ambulatory Visit: Payer: Self-pay | Admitting: Student

## 2019-02-08 DIAGNOSIS — R221 Localized swelling, mass and lump, neck: Secondary | ICD-10-CM

## 2019-02-22 ENCOUNTER — Encounter (HOSPITAL_COMMUNITY): Payer: 59 | Admitting: Internal Medicine

## 2019-02-22 ENCOUNTER — Ambulatory Visit (HOSPITAL_COMMUNITY): Admission: RE | Admit: 2019-02-22 | Payer: BLUE CROSS/BLUE SHIELD | Source: Ambulatory Visit

## 2019-02-25 ENCOUNTER — Other Ambulatory Visit: Payer: Self-pay

## 2019-02-25 ENCOUNTER — Inpatient Hospital Stay: Payer: BLUE CROSS/BLUE SHIELD

## 2019-02-25 ENCOUNTER — Inpatient Hospital Stay (HOSPITAL_BASED_OUTPATIENT_CLINIC_OR_DEPARTMENT_OTHER): Payer: BLUE CROSS/BLUE SHIELD | Admitting: Oncology

## 2019-02-25 ENCOUNTER — Encounter: Payer: Self-pay | Admitting: Oncology

## 2019-02-25 ENCOUNTER — Other Ambulatory Visit (HOSPITAL_COMMUNITY): Payer: Self-pay | Admitting: Cardiology

## 2019-02-25 VITALS — BP 142/64 | HR 89 | Temp 98.6°F | Resp 18 | Ht 59.0 in | Wt 221.6 lb

## 2019-02-25 DIAGNOSIS — C50811 Malignant neoplasm of overlapping sites of right female breast: Secondary | ICD-10-CM

## 2019-02-25 DIAGNOSIS — C50411 Malignant neoplasm of upper-outer quadrant of right female breast: Secondary | ICD-10-CM

## 2019-02-25 DIAGNOSIS — Z170 Estrogen receptor positive status [ER+]: Secondary | ICD-10-CM

## 2019-02-25 DIAGNOSIS — M899 Disorder of bone, unspecified: Secondary | ICD-10-CM | POA: Diagnosis not present

## 2019-02-25 DIAGNOSIS — Z79811 Long term (current) use of aromatase inhibitors: Secondary | ICD-10-CM

## 2019-02-25 DIAGNOSIS — Z807 Family history of other malignant neoplasms of lymphoid, hematopoietic and related tissues: Secondary | ICD-10-CM

## 2019-02-25 DIAGNOSIS — Z9011 Acquired absence of right breast and nipple: Secondary | ICD-10-CM

## 2019-02-25 DIAGNOSIS — Z5112 Encounter for antineoplastic immunotherapy: Secondary | ICD-10-CM | POA: Diagnosis not present

## 2019-02-25 DIAGNOSIS — Z801 Family history of malignant neoplasm of trachea, bronchus and lung: Secondary | ICD-10-CM

## 2019-02-25 DIAGNOSIS — Z803 Family history of malignant neoplasm of breast: Secondary | ICD-10-CM

## 2019-02-25 LAB — COMPREHENSIVE METABOLIC PANEL
ALT: 20 U/L (ref 0–44)
AST: 17 U/L (ref 15–41)
Albumin: 3.4 g/dL — ABNORMAL LOW (ref 3.5–5.0)
Alkaline Phosphatase: 121 U/L (ref 38–126)
Anion gap: 9 (ref 5–15)
BUN: 32 mg/dL — ABNORMAL HIGH (ref 6–20)
CO2: 27 mmol/L (ref 22–32)
Calcium: 10.8 mg/dL — ABNORMAL HIGH (ref 8.9–10.3)
Chloride: 102 mmol/L (ref 98–111)
Creatinine, Ser: 1.42 mg/dL — ABNORMAL HIGH (ref 0.44–1.00)
GFR calc Af Amer: 51 mL/min — ABNORMAL LOW (ref >60)
GFR calc non Af Amer: 44 mL/min — ABNORMAL LOW (ref >60)
Glucose, Bld: 83 mg/dL (ref 70–99)
Potassium: 3.9 mmol/L (ref 3.5–5.1)
Sodium: 138 mmol/L (ref 135–145)
Total Bilirubin: 0.4 mg/dL (ref 0.3–1.2)
Total Protein: 7.4 g/dL (ref 6.5–8.1)

## 2019-02-25 LAB — CBC WITH DIFFERENTIAL/PLATELET
Abs Immature Granulocytes: 0.02 10*3/uL (ref 0.00–0.07)
Basophils Absolute: 0 10*3/uL (ref 0.0–0.1)
Basophils Relative: 0 %
Eosinophils Absolute: 0.1 10*3/uL (ref 0.0–0.5)
Eosinophils Relative: 2 %
HCT: 34.3 % — ABNORMAL LOW (ref 36.0–46.0)
Hemoglobin: 10.8 g/dL — ABNORMAL LOW (ref 12.0–15.0)
Immature Granulocytes: 0 %
Lymphocytes Relative: 21 %
Lymphs Abs: 1.1 10*3/uL (ref 0.7–4.0)
MCH: 27 pg (ref 26.0–34.0)
MCHC: 31.5 g/dL (ref 30.0–36.0)
MCV: 85.8 fL (ref 80.0–100.0)
Monocytes Absolute: 0.4 10*3/uL (ref 0.1–1.0)
Monocytes Relative: 9 %
Neutro Abs: 3.4 10*3/uL (ref 1.7–7.7)
Neutrophils Relative %: 68 %
Platelets: 169 10*3/uL (ref 150–400)
RBC: 4 MIL/uL (ref 3.87–5.11)
RDW: 16.3 % — ABNORMAL HIGH (ref 11.5–15.5)
WBC: 5 10*3/uL (ref 4.0–10.5)
nRBC: 0 % (ref 0.0–0.2)

## 2019-02-25 MED ORDER — SODIUM CHLORIDE 0.9 % IV SOLN
Freq: Once | INTRAVENOUS | Status: AC
  Administered 2019-02-25: 15:00:00 via INTRAVENOUS
  Filled 2019-02-25: qty 250

## 2019-02-25 MED ORDER — NEBIVOLOL HCL 20 MG PO TABS: 20.0000 mg | ORAL_TABLET | Freq: Every day | ORAL | 3 refills | Status: DC

## 2019-02-25 MED ORDER — HEPARIN SOD (PORK) LOCK FLUSH 100 UNIT/ML IV SOLN
500.0000 [IU] | Freq: Once | INTRAVENOUS | Status: AC | PRN
  Administered 2019-02-25: 500 [IU]
  Filled 2019-02-25: qty 5

## 2019-02-25 MED ORDER — TRASTUZUMAB CHEMO 150 MG IV SOLR
6.0000 mg/kg | Freq: Once | INTRAVENOUS | Status: AC
  Administered 2019-02-25: 588 mg via INTRAVENOUS
  Filled 2019-02-25: qty 28

## 2019-02-25 MED ORDER — ACETAMINOPHEN 325 MG PO TABS
ORAL_TABLET | ORAL | Status: AC
  Filled 2019-02-25: qty 2

## 2019-02-25 MED ORDER — DIPHENHYDRAMINE HCL 25 MG PO CAPS
ORAL_CAPSULE | ORAL | Status: AC
  Filled 2019-02-25: qty 1

## 2019-02-25 MED ORDER — DIPHENHYDRAMINE HCL 25 MG PO CAPS
25.0000 mg | ORAL_CAPSULE | Freq: Once | ORAL | Status: AC
  Administered 2019-02-25: 25 mg via ORAL

## 2019-02-25 MED ORDER — ACETAMINOPHEN 325 MG PO TABS
650.0000 mg | ORAL_TABLET | Freq: Once | ORAL | Status: AC
  Administered 2019-02-25: 650 mg via ORAL

## 2019-02-25 MED ORDER — SODIUM CHLORIDE 0.9% FLUSH
10.0000 mL | INTRAVENOUS | Status: DC | PRN
  Administered 2019-02-25: 10 mL
  Filled 2019-02-25: qty 10

## 2019-02-25 NOTE — Patient Instructions (Signed)
[facility] Discharge Instructions for Patients Receiving Chemotherapy  Today you received the following chemotherapy agents Herceptin  To help prevent nausea and vomiting after your treatment, we encourage you to take your nausea medication as directed   If you develop nausea and vomiting that is not controlled by your nausea medication, call the clinic.   BELOW ARE SYMPTOMS THAT SHOULD BE REPORTED IMMEDIATELY:  *FEVER GREATER THAN 100.5 F  *CHILLS WITH OR WITHOUT FEVER  NAUSEA AND VOMITING THAT IS NOT CONTROLLED WITH YOUR NAUSEA MEDICATION  *UNUSUAL SHORTNESS OF BREATH  *UNUSUAL BRUISING OR BLEEDING  TENDERNESS IN MOUTH AND THROAT WITH OR WITHOUT PRESENCE OF ULCERS  *URINARY PROBLEMS  *BOWEL PROBLEMS  UNUSUAL RASH Items with * indicate a potential emergency and should be followed up as soon as possible.  Feel free to call the clinic should you have any questions or concerns. The clinic phone number is [phone].  Please show the CHEMO ALERT CARD at check-in to the Emergency Department and triage nurse.   

## 2019-02-25 NOTE — Progress Notes (Signed)
Ok to treat with Herceptin with Echo from 11/2018 per Dr. Jana Hakim.

## 2019-02-25 NOTE — Progress Notes (Signed)
[place]  Telephone:([phone] Fax:(336) [phone]    ID: [name] [name] DOB: 12-08-1970  MR#: [id number]  GNF#:[id number]  Patient Care Team: Marda Stalker, PA-C as PCP - General (Family Medicine) Jerline Pain, MD as PCP - Cardiology (Cardiology) Charly Hunton, Virgie Dad, MD as Consulting Physician (Oncology) Loreta Ave, MD as Referring Physician (Internal Medicine) Donato Heinz, MD as Consulting Physician (Nephrology) Kyung Rudd, MD as Consulting Physician (Radiation Oncology) Jovita Kussmaul, MD as Consulting Physician (General Surgery) Larey Dresser, MD as Consulting Physician (Cardiology) Mauro Kaufmann, RN as Oncology Nurse Navigator Rockwell Germany, RN as Oncology Nurse Navigator OTHER MD:   CHIEF COMPLAINT: Triple positive breast cancer  CURRENT TREATMENT: Trastuzumab; anastrozole   HISTORY OF CURRENT ILLNESS: From the original intake note:  [name] [name] palpated a lump on 01/15/2018. She felt soreness and shooting pain under her arm and in the right breast. She underwent bilateral diagnostic mammography with tomography and right breast ultrasonography at [facility] on 03/12/2018 showing: breast density category C. The is architectural distortion at the 11 o'clock position. There is also an oval mass in the right breast anterior depth. Examination of the right axilla showed enlarged lymph nodes. Ultrasonography showed a 4.6 cm mass in the right breast upper outer quadrant posterior depth. An additional 1.2 cm oval mass in the right breast 12 o'clock middle depth. The lymph nodes in the right axillary are highly suggestive of malignancy.   Accordingly on 03/16/2018 she proceeded to biopsy of the right breast area in question. The pathology from this procedure showed ([id number]): At both the 11 and 12 o'clock positions: Invasive ductal carcinoma grade II. Ductal carcinoma in situ, high grade, with necrosis and calcifications. Prognostic indicators  significant for: estrogen receptor, 90% positive and progesterone receptor, 40% positive, both with strong staining intensity. Proliferation marker Ki67 at 80%. HER2 amplified with ratios HER2/CEP17 SIGNALS 6.90 and average HER2 copies per cell 14.50  The patient's subsequent history is as detailed below.   INTERVAL HISTORY: [name] returns today for follow-up and treatment of her triple positive breast cancer.   She continues on trastuzumab with good tolerance. She denies any side effects. She was scheduled to undergo echocardiogram on 02/22/2019, but she reports the office called her to cancel the appointment.  She also continues on anastrozole. She reports nausea, aching in her fingers, and insomnia.  She considers these not major problems.  Since her last visit here, she underwent a chest CT angiography with contrast on 02/04/2019 for acute right supraclavicular swelling for 1 month showing:  no pulmonary embolus or adenopathy of axilla or mediastinum is evident; new small lytic focus within the manubrium adjacent to right first costosternal junction, suspected metastasis; interval sclerosis of prior T6 lytic lesion, stable left T3 inferior articular facet sclerosis.  She also underwent a neck CT with contrast on 02/04/2019 for acute right supraclavicular swelling for 1 month showing: hazy soft tissue stranding within the right supraclavicular region and upper right anterior chest wall with induration and within the anterior aspect of the right upper lobe, most likely reflecting post radiation changes; no pathologically enlarged right supraclavicular lymph nodes or other mass lesion identified; superimposed 7 mm right subpleural nodule within this region is indeterminate; mildly enlarged 12 mm right paratracheal node, indeterminate; no other acute abnormality within the neck.     REVIEW OF SYSTEMS: [name] reports feeling nervous about her recent CT scans, but states she feels well overall. She  reports pain to her right chest wall,  including a full-feeling, from the surgery. She walks for exercise, with her goal being 2,500 steps each day. She states she is working from home right now. Her family has been removing their clothing prior to entering the house due to virus concerns. The patient denies unusual headaches, visual changes, nausea, vomiting, or dizziness. There has been no unusual cough, phlegm production, or pleurisy. This been no change in bowel or bladder habits. The patient denies unexplained fatigue or unexplained weight loss, bleeding, rash, or fever. A detailed review of systems was otherwise noncontributory.    PAST MEDICAL HISTORY: Past Medical History:  Diagnosis Date  . Anxiety   . Breast cancer, left breast ([place]) 2000   Underwent lumpectomy, chemotherapy and radiation  . Facial paralysis/Bells palsy    left side  . Family history of lung cancer   . Family history of lymphoma   . Hypertension   . Neuropathy    IN FINGERS AND TOES   RECENT INFUSION OF CHEMO  . Personal history of breast cancer 05/06/2018  . Renal disorder    just after TIA acute kidney injury  . TIA (transient ischemic attack) 03/16/2017   [facility]   The patient has a previous history of left breast cancer diagnosed in 2000. She was seen by [facility] in [place], [place] under Dr. Loreta Ave. According to the patient, her left breast cancer was stage II with no lymph node involvement (likely T2N0). She had chemotherapy every 3 weeks for about 6 months. She did not takes any "red drugs." She also did not take anti-estrogens (likely ER/PR negative).   TIA in 2018. She saw a neurologist as a precaution. She denies any clotting issues.    PAST SURGICAL HISTORY: Past Surgical History:  Procedure Laterality Date  . BREAST LUMPECTOMY WITH AXILLARY LYMPH NODE BIOPSY Left 2000   biopsy  . BREAST SURGERY     partial mastectomy with lymph node removal  . MASTECTOMY MODIFIED  RADICAL Right 08/26/2018   Procedure: MASTECTOMY MODIFIED RADICAL;  Surgeon: Jovita Kussmaul, MD;  Location: [place] [facility];  Service: General;  Laterality: Right;  . MODIFIED MASTECTOMY Right 08/26/2018  . PORTACATH PLACEMENT Left 04/08/2018   Procedure: INSERTION PORT-A-CATH;  Surgeon: Jovita Kussmaul, MD;  Location: [facility];  Service: General;  Laterality: Left;  . TUBAL LIGATION Bilateral 2004  . WISDOM TOOTH EXTRACTION      FAMILY HISTORY Family History  Problem Relation Age of Onset  . Hypertension Mother   . Hypertension Father   . Diverticulosis Father   . Diabetes Father   . Lung cancer Father 34       hx smoking  . Hypertension Maternal Grandmother   . CAD Maternal Grandmother   . Alzheimer's disease Maternal Grandmother        died at 30  . Hypertension Paternal Grandmother   . CAD Paternal Grandmother   . Stroke Paternal Grandfather   . Sickle cell trait Paternal Grandfather   . Pneumonia Maternal Aunt        died at a young adult  . Lymphoma Paternal Aunt 62   The patient's father is alive at age 38 and had a history of lung cancer. The patient's mother is alive at age 22. The patient has 1 brother and no sisters. She denies a family history of breast or ovarian cancer.     GYNECOLOGIC HISTORY:  No LMP recorded. (Menstrual status: Perimenopausal). Menarche: 48 years old Age at first live birth: 48 years  old She is GXP3. She is no longer having periods, which were irregular and light. Her LMP was  January 2019. She is not having hot flashes. The patient used oral contraceptive from [phone] and the Depo-provera shot from [phone] with no complications. She never used HRT.    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 Her husband, [name], is a Administrator. The patient's son, Shea Stakes age 77, works at [employer] in [place]. The patient's daughters, Lilia Pro age 75, and Levonne Spiller age 11, are students. The patient's adopted son, Vonna Kotyk age 71, is also a  Ship broker.    ADVANCED DIRECTIVES:    HEALTH MAINTENANCE: Social History   Tobacco Use  . Smoking status: Never Smoker  . Smokeless tobacco: Never Used  Substance Use Topics  . Alcohol use: No  . Drug use: No     Colonoscopy: n/a  PAP: 2015  Bone density: never done   Allergies  Allergen Reactions  . Ace Inhibitors Swelling    Swelling of lips and face  . Lisinopril Swelling    Swelling of lips, face  . Amlodipine Swelling    Leg swelling  . Adhesive [Tape] Itching and Rash    Please use paper tape  . Latex Itching and Rash    Current Outpatient Medications  Medication Sig Dispense Refill  . anastrozole (ARIMIDEX) 1 MG tablet Take 1 tablet (1 mg total) by mouth daily. 90 tablet 4  . aspirin EC 81 MG tablet Take 81 mg by mouth daily.    . cloNIDine (CATAPRES) 0.2 MG tablet Take 0.2 mg by mouth daily as needed (BP above 150).     . Cyanocobalamin (VITAMIN B12) 500 MCG TABS Take 500 mcg by mouth daily.     . ferrous sulfate 325 (65 FE) MG tablet Take 1 tablet (325 mg total) by mouth daily. 30 tablet 0  . lidocaine-prilocaine (EMLA) cream Apply 1 application topically as needed.    Marland Kitchen losartan (COZAAR) 50 MG tablet Take 1 tablet (50 mg total) by mouth daily. 30 tablet 11  . Multiple Vitamin (MULITIVITAMIN WITH MINERALS) TABS Take 1 tablet by mouth daily.      . Nebivolol HCl (BYSTOLIC) 20 MG TABS Take 1 tablet (20 mg total) by mouth daily. 90 tablet 3  . spironolactone (ALDACTONE) 50 MG tablet TAKE 1 TABLET (50 MG TOTAL) BY MOUTH DAILY. 30 tablet 6   No current facility-administered medications for this visit.     OBJECTIVE: Middle-aged African-American woman in no acute distress  Vitals:   02/25/19 1322  BP: (!) 142/64  Pulse: 89  Resp: 18  Temp: 98.6 F (37 C)  SpO2: 100%     Body mass index is 44.76 kg/m.   Wt Readings from Last 3 Encounters:  02/25/19 221 lb 9.6 oz (100.5 kg)  01/18/19 215 lb (97.5 kg)  12/03/18 215 lb (97.5 kg)  ECOG FS:1 - Symptomatic  but completely ambulatory  Sclerae unicteric, pupils round and equal No cervical or supraclavicular adenopathy Lungs no rales or rhonchi Heart regular rate and rhythm Abd soft, nontender, positive bowel sounds MSK no focal spinal tenderness, no upper extremity lymphedema Neuro: nonfocal, well oriented, appropriate affect Breasts: The right breast is status post mastectomy and radiation.  There remains hyperpigmentation over the radiation port area but there is no findings suggestive of persistent or recurrent disease.  The left breast is benign.  Both axillae are benign.  LAB RESULTS:  CMP     Component Value  Date/Time   NA 138 02/25/2019 1311   K 3.9 02/25/2019 1311   CL 102 02/25/2019 1311   CO2 27 02/25/2019 1311   GLUCOSE 83 02/25/2019 1311   BUN 32 (H) 02/25/2019 1311   CREATININE 1.42 (H) 02/25/2019 1311   CREATININE 1.29 (H) 03/25/2018 1129   CALCIUM 10.8 (H) 02/25/2019 1311   CALCIUM 10.2 06/25/2018 1425   PROT 7.4 02/25/2019 1311   ALBUMIN 3.4 (L) 02/25/2019 1311   AST 17 02/25/2019 1311   AST 22 03/25/2018 1129   ALT 20 02/25/2019 1311   ALT 29 03/25/2018 1129   ALKPHOS 121 02/25/2019 1311   BILITOT 0.4 02/25/2019 1311   BILITOT 0.3 03/25/2018 1129   GFRNONAA 44 (L) 02/25/2019 1311   GFRNONAA 49 (L) 03/25/2018 1129   GFRAA 51 (L) 02/25/2019 1311   GFRAA 57 (L) 03/25/2018 1129    No results found for: TOTALPROTELP, ALBUMINELP, A1GS, A2GS, BETS, BETA2SER, GAMS, MSPIKE, SPEI  No results found for: KPAFRELGTCHN, LAMBDASER, KAPLAMBRATIO  Lab Results  Component Value Date   WBC 5.0 02/25/2019   NEUTROABS 3.4 02/25/2019   HGB 10.8 (L) 02/25/2019   HCT 34.3 (L) 02/25/2019   MCV 85.8 02/25/2019   PLT 169 02/25/2019    '@LASTCHEMISTRY'$ @  No results found for: LABCA2  No components found for: OVZCHY850  No results for input(s): INR in the last 168 hours.  No results found for: LABCA2  No results found for: YDX412  No results found for: INO676  No  results found for: HMC947  No results found for: CA2729  No components found for: HGQUANT  No results found for: CEA1 / No results found for: CEA1   No results found for: AFPTUMOR  No results found for: CHROMOGRNA  No results found for: PSA1  Appointment on 02/25/2019  Component Date Value Ref Range Status  . Sodium 02/25/2019 138  135 - 145 mmol/L Final  . Potassium 02/25/2019 3.9  3.5 - 5.1 mmol/L Final  . Chloride 02/25/2019 102  98 - 111 mmol/L Final  . CO2 02/25/2019 27  22 - 32 mmol/L Final  . Glucose, Bld 02/25/2019 83  70 - 99 mg/dL Final  . BUN 02/25/2019 32* 6 - 20 mg/dL Final  . Creatinine, Ser 02/25/2019 1.42* 0.44 - 1.00 mg/dL Final  . Calcium 02/25/2019 10.8* 8.9 - 10.3 mg/dL Final  . Total Protein 02/25/2019 7.4  6.5 - 8.1 g/dL Final  . Albumin 02/25/2019 3.4* 3.5 - 5.0 g/dL Final  . AST 02/25/2019 17  15 - 41 U/L Final  . ALT 02/25/2019 20  0 - 44 U/L Final  . Alkaline Phosphatase 02/25/2019 121  38 - 126 U/L Final  . Total Bilirubin 02/25/2019 0.4  0.3 - 1.2 mg/dL Final  . GFR calc non Af Amer 02/25/2019 44* >60 mL/min Final  . GFR calc Af Amer 02/25/2019 51* >60 mL/min Final  . Anion gap 02/25/2019 9  5 - 15 Final   Performed at [facility], [place] [address] [place], [place] [address]  . WBC 02/25/2019 5.0  4.0 - 10.5 K/uL Final  . RBC 02/25/2019 4.00  3.87 - 5.11 MIL/uL Final  . Hemoglobin 02/25/2019 10.8* 12.0 - 15.0 g/dL Final  . HCT 02/25/2019 34.3* 36.0 - 46.0 % Final  . MCV 02/25/2019 85.8  80.0 - 100.0 fL Final  . MCH 02/25/2019 27.0  26.0 - 34.0 pg Final  . MCHC 02/25/2019 31.5  30.0 - 36.0 g/dL Final  . RDW  02/25/2019 16.3* 11.5 - 15.5 % Final  . Platelets 02/25/2019 169  150 - 400 K/uL Final  . nRBC 02/25/2019 0.0  0.0 - 0.2 % Final  . Neutrophils Relative % 02/25/2019 68  % Final  . Neutro Abs 02/25/2019 3.4  1.7 - 7.7 K/uL Final  . Lymphocytes Relative 02/25/2019 21  % Final  . Lymphs Abs 02/25/2019 1.1  0.7 - 4.0  K/uL Final  . Monocytes Relative 02/25/2019 9  % Final  . Monocytes Absolute 02/25/2019 0.4  0.1 - 1.0 K/uL Final  . Eosinophils Relative 02/25/2019 2  % Final  . Eosinophils Absolute 02/25/2019 0.1  0.0 - 0.5 K/uL Final  . Basophils Relative 02/25/2019 0  % Final  . Basophils Absolute 02/25/2019 0.0  0.0 - 0.1 K/uL Final  . Immature Granulocytes 02/25/2019 0  % Final  . Abs Immature Granulocytes 02/25/2019 0.02  0.00 - 0.07 K/uL Final   Performed at [facility], [address] [place], [place] [address]    (this displays the last labs from the last 3 days)  No results found for: TOTALPROTELP, ALBUMINELP, A1GS, A2GS, BETS, BETA2SER, GAMS, MSPIKE, SPEI (this displays SPEP labs)  No results found for: KPAFRELGTCHN, LAMBDASER, KAPLAMBRATIO (kappa/lambda light chains)  No results found for: HGBA, HGBA2QUANT, HGBFQUANT, HGBSQUAN (Hemoglobinopathy evaluation)   No results found for: LDH  No results found for: IRON, TIBC, IRONPCTSAT (Iron and TIBC)  No results found for: FERRITIN  Urinalysis    Component Value Date/Time   COLORURINE STRAW (A) 03/17/2017 0600   APPEARANCEUR CLEAR (A) 03/17/2017 0600   LABSPEC 1.031 (H) 03/17/2017 0600   PHURINE 5.0 03/17/2017 0600   GLUCOSEU NEGATIVE 03/17/2017 0600   HGBUR NEGATIVE 03/17/2017 0600   BILIRUBINUR NEGATIVE 03/17/2017 0600   KETONESUR NEGATIVE 03/17/2017 0600   PROTEINUR NEGATIVE 03/17/2017 0600   UROBILINOGEN 0.2 11/29/2011 1656   NITRITE NEGATIVE 03/17/2017 0600   LEUKOCYTESUR NEGATIVE 03/17/2017 0600     STUDIES: Ct Soft Tissue Neck W Contrast  Result Date: 02/04/2019 CLINICAL DATA:  Initial evaluation for acute right supraclavicular swelling for 1 month. EXAM: CT NECK WITH CONTRAST TECHNIQUE: Multidetector CT imaging of the neck was performed using the standard protocol following the bolus administration of intravenous contrast. CONTRAST:  136m ISOVUE-370 IOPAMIDOL (ISOVUE-370) INJECTION 76%  COMPARISON:  None available. FINDINGS: Pharynx and larynx: Oral cavity within normal limits without discrete mass or loculated collection. No acute inflammatory changes about the dentition. No base of tongue lesion. Palatine tonsils symmetric and within normal limits. Parapharyngeal fat maintained. Nasopharynx normal. No retropharyngeal collection. Epiglottis normal. Vallecula grossly clear. Remainder of the hypopharynx and supraglottic larynx within normal limits. Glottis normal. Subglottic airway clear. Salivary glands: Salivary glands including the parotid and submandibular glands within normal limits. Thyroid: Subcentimeter hypodensity noted within the left lobe of thyroid, of doubtful significance given size. Thyroid otherwise unremarkable. Lymph nodes: No pathologically enlarged lymph nodes identified within the neck. Single nonenlarged 9 mm lymph node present within the right supraclavicular region (series 23, image 76). Hazy stranding seen within the overlying subcutaneous fat at the anterior right chest wall and right supraclavicular region, with involvement of the right pectoralis muscle. Given history, findings most likely reflect post treatment/radiation changes. Sequelae of prior right-sided mastectomy partially visualized. Scattered venous collaterals within the subcutaneous fat of this region likely related to prior surgery. Single mildly enlarged 12 mm right paratracheal lymph node noted (series 23, image 88), indeterminate. No other adenopathy within the visualized upper mediastinum. Sequelae  of prior bilateral axillary nodal dissection noted. Postsurgical changes noted within the left breast as well. Vascular: Normal intravascular enhancement seen throughout the neck. Limited intracranial: Unremarkable. Visualized orbits: Visualized globes and orbital soft tissues within normal limits. Mastoids and visualized paranasal sinuses: Visualized paranasal sinuses are clear. Mastoid air cells and middle  ear cavities are well pneumatized and free of fluid. Skeleton: No acute osseous abnormality. No discrete lytic or blastic osseous lesions. Upper chest: Probable postradiation changes noted within the anterior aspect of the right upper lobe. Superimposed 7 mm subpleural nodule (series 27, image 111). Findings better evaluated on concomitant CT of the chest. Left-sided Port-A-Cath noted. Other: None. IMPRESSION: 1. Hazy soft tissue stranding with induration within the right supraclavicular region and upper right anterior chest wall, most likely reflecting post radiation changes given patient history. No pathologically enlarged right supraclavicular lymph nodes or other mass lesion identified. 2. Hazy soft tissue stranding within the anterior aspect of the right upper lobe, also likely reflecting post radiation changes. Superimposed 7 mm right subpleural nodule within this region is indeterminate. Attention at follow-up recommended. 3. Mildly enlarged 12 mm right paratracheal node, indeterminate. Attention at follow-up regarding this finding recommended as well. 4. No other acute abnormality within the neck. Electronically Signed   By: Jeannine Boga M.D.   On: 02/04/2019 18:59   Ct Angio Chest Pe W Or Wo Contrast  Result Date: 02/04/2019 CLINICAL DATA:  48 y/o F; history of breast cancer post mastectomy, chemo, and radiation with right supraclavicular swelling and pain. EXAM: CT ANGIOGRAPHY CHEST WITH CONTRAST TECHNIQUE: Multidetector CT imaging of the chest was performed using the standard protocol during bolus administration of intravenous contrast. Multiplanar CT image reconstructions and MIPs were obtained to evaluate the vascular anatomy. CONTRAST:  141m ISOVUE-370 IOPAMIDOL (ISOVUE-370) INJECTION 76% COMPARISON:  Concurrent CT of the neck. 04/03/2018 CT of the chest. 04/03/2018 bone scan. FINDINGS: Cardiovascular: Satisfactory opacification of the pulmonary arteries to the segmental level. No evidence  of pulmonary embolism. Mild cardiomegaly. No pericardial effusion. Port catheter tip extends to the lower SVC. Contrast administered via the port catheter. Patency of the central venous system not assessed on CT angiogram chest. Mediastinum/Nodes: No enlarged mediastinal, hilar, or axillary lymph nodes. Thyroid gland, trachea, and esophagus demonstrate no significant findings. Lungs/Pleura: Several stable 2-3 mm juxtapleural nodules are present along the anterior aspect of right frontal lobe, likely intrapulmonary lymph nodes. Minor atelectasis of the right middle lobe and lung bases. No consolidation, effusion, or pneumothorax. Upper Abdomen: No acute abnormality. Musculoskeletal: Right mastectomy and axillary lymphadenectomy. Spiculated soft tissue within the deep right breast compatible with scar tissue. New lytic focus within the manubrium adjacent to right first costosternal junction (series 9, image 36). Stable left T3 inferior articular facet sclerosis. Prior T6 vertebral body lucent lesion is now sclerotic. Review of the MIP images confirms the above findings. IMPRESSION: 1. No pulmonary embolus identified. No adenopathy of axilla or mediastinum is evident. 2. New small lytic focus within the manubrium adjacent to right first costosternal junction, suspected metastasis. 3. Interval sclerosis of prior T6 lytic lesion. Stable left T3 inferior articular facet sclerosis. Electronically Signed   By: LKristine GarbeM.D.   On: 02/04/2019 19:01    ELIGIBLE FOR AVAILABLE RESEARCH PROTOCOL: no   ASSESSMENT: 48y.o. [place]  (1) status post left lumpectomy in 2000 for a (?) T2N0 breast cancer,  (a) status post adjuvant chemotherapy  (b) status post adjuvant radiation  (c) did not receive antiestrogens  (  2) genetics testing May 18, 2018 through the common Hereditary Cancers Panel + Myelodysplastic Syndrome/Leukemia Panel found no deleterious mutations in APC, ATM, AXIN2, BARD1, BLM,  BMPR1A, BRCA1, BRCA2, BRIP1, CDH1, CDK4, CDKN2A (p14ARF), CDKN2A (p16INK4a), CEBPA, CHEK2, CTNNA1, DICER1, EPCAM*, GATA2, GREM1*, HRAS, KIT, MEN1, MLH1, MSH2, MSH3, MSH6, MUTYH, NBN, NF1, PALB2, PDGFRA, PMS2, POLD1, POLE, PTEN, RAD50, RAD51C, RAD51D, RUNX1, SDHB, SDHC, SDHD, SMAD4, SMARCA4, STK11, TERC, TERT, TP53, TSC1, TSC2, VHL The following genes were evaluated for sequence changes only: HOXB13*, NTHL1*, SDHA  (a)  3 variants of uncertain significance were identified in the genes BARD1 c.764A>G (p.Asn255Ser), BRIP1 c.2563C>T (p.Arg855Cys), and PALB2 c.3103A>T (p.Ile1035Phe).   (3) status post right breast biopsy 03/16/2018 for a clinical mT2-3 N2, anatomic stage III invasive ductal carcinoma, grade 2, triple positive, with an MIB-1 of 80%.  (a) bone scan and chest CT scan negative for metastases except for a T6 lytic lesion of concern  (b) thoracic spine MRI was ordered May 2019 but never performed.  (4) neoadjuvant chemotherapy consisting of carboplatin, docetaxel, trastuzumab and Pertuzumab every 3 weeks x 6 starting 04/16/2018  (a) pertuzumab held beginning with cycle 2 because of diarrhea  (5) trastuzumab to be continued to a total of 1 year  (a) echocardiogram on 06/29/2018 showed an ejection fraction in the 65-70% range  (b) echocardiogram 11/19/2018 showed an ejection fraction in the 60-65% range  (6) right mastectomy and sentinel lymph node sampling on 08/26/2018 found a residual 2.5cm invasive ductal carcinoma, with 2 of 5 sentinel lymph nodes positive, ypT2, ypN1a; margins were clear  (7) adjuvant radiation started 10/02/2018  (8) anastrozole started 12/03/2018  (a) [place] on 10/01/2018 was 64.3, and estradiol 7.0, consistent with menopause  (b) referral to pelvic floor rehabilitation 12/03/2018  (9) CT of neck and CT angiography of the chest 02/04/2019 finds the T6 lytic lesion noted May 2019 (see #3 above) is now sclerotic; a sclerotic T3 lesion is stable; there is a new lytic  lesion in the manubrium  PLAN: [name] is now 6 months out from definitive surgery for her breast cancer.  She is tolerating the trastuzumab and anastrozole generally well.  She tells me her echocardiogram 03 23 was counseled on that certainly is possible given the current pandemic.  I will try to get it rescheduled since we do need to monitor her cardiac function while she is receiving trastuzumab.  We reviewed the bone lesions extensively.  Recall there was a lytic lesion at T6 noted in May 2019.  We had ordered an MRI of this area for further evaluation but this was not done, for unclear reasons.  That lesion now appears sclerotic.  On the other hand she has a new lesion in the manubrium.  There is also a sclerotic lesion at T3 which is described as stable.  I am obtaining additional labs today including an SPEP and kappa lambda ratios and a CA-[id number].  Assuming these are negative, which seems likely, we will obtain a PET scan for further evaluation.  I have also put her in for a biopsy of the manubrial lesion for definitive pathologic diagnosis.  If we are dealing with metastatic breast cancer I would probably switch her to T-DM1 for 6 doses and then back to trastuzumab longer-term.  She will also be started on either zoledronate or denosumab/Xgeva.  [name] is blaming herself for this problem.  I reassured her that she is not responsible for having cancer.  She is very religious and has been doing a lot  of praying about this.  She is also doing some exercising, all of which will be helpful.  Her next treatment here will be 03/24/2019.  I will see her that day.  We should have all the available information then to make a definitive diagnosis and come up with that treatment plan.   She will call with any other issues that may develop before then.   Jax Kentner, Virgie Dad, MD  02/25/19 2:08 PM [facility] [address] [place], [place] [address] Tel.  [phone]    Fax. [phone]   I, Wilburn Mylar, am acting as scribe for Dr. Virgie Dad. Britania Shreeve.  I, Lurline Del MD, have reviewed the above documentation for accuracy and completeness, and I agree with the above.

## 2019-02-26 ENCOUNTER — Telehealth (HOSPITAL_COMMUNITY): Payer: Self-pay

## 2019-02-26 ENCOUNTER — Telehealth: Payer: Self-pay | Admitting: *Deleted

## 2019-02-26 LAB — MULTIPLE MYELOMA PANEL, SERUM
Albumin SerPl Elph-Mcnc: 3.4 g/dL (ref 2.9–4.4)
Albumin/Glob SerPl: 1 (ref 0.7–1.7)
Alpha 1: 0.3 g/dL (ref 0.0–0.4)
Alpha2 Glob SerPl Elph-Mcnc: 1.1 g/dL — ABNORMAL HIGH (ref 0.4–1.0)
B-Globulin SerPl Elph-Mcnc: 1.1 g/dL (ref 0.7–1.3)
Gamma Glob SerPl Elph-Mcnc: 1.2 g/dL (ref 0.4–1.8)
Globulin, Total: 3.6 g/dL (ref 2.2–3.9)
IgA: 191 mg/dL (ref 87–352)
IgG (Immunoglobin G), Serum: 1264 mg/dL (ref 700–1600)
IgM (Immunoglobulin M), Srm: 73 mg/dL (ref 26–217)
Total Protein ELP: 7 g/dL (ref 6.0–8.5)

## 2019-02-26 LAB — CANCER ANTIGEN 27.29: CA 27.29: 38.7 U/mL — ABNORMAL HIGH (ref 0.0–38.6)

## 2019-02-26 LAB — KAPPA/LAMBDA LIGHT CHAINS
Kappa free light chain: 76.7 mg/L — ABNORMAL HIGH (ref 3.3–19.4)
Kappa, lambda light chain ratio: 2.82 — ABNORMAL HIGH (ref 0.26–1.65)
Lambda free light chains: 27.2 mg/L — ABNORMAL HIGH (ref 5.7–26.3)

## 2019-02-26 NOTE — Telephone Encounter (Signed)
PA initiated for Bystolic, awaiting response.

## 2019-02-26 NOTE — Telephone Encounter (Signed)
Left vm regarding appt from 3/26 with Dr. Jana Hakim to assess needs, questions or concerns.

## 2019-03-04 ENCOUNTER — Encounter (HOSPITAL_COMMUNITY): Payer: Self-pay

## 2019-03-04 ENCOUNTER — Encounter (HOSPITAL_COMMUNITY): Admission: RE | Admit: 2019-03-04 | Payer: BLUE CROSS/BLUE SHIELD | Source: Ambulatory Visit

## 2019-03-05 ENCOUNTER — Other Ambulatory Visit (HOSPITAL_COMMUNITY): Payer: Self-pay | Admitting: *Deleted

## 2019-03-05 NOTE — Telephone Encounter (Signed)
Received PA response for Bystolic. Medication DENIED.  Spoke with pharmacy at [facility], they state BYstolic is brand name drug and per patient plan, pt had to have tried and failed generic equivalent to be considered for approval.  LM on patient VM to discuss.

## 2019-03-08 ENCOUNTER — Other Ambulatory Visit: Payer: Self-pay | Admitting: Oncology

## 2019-03-08 DIAGNOSIS — C50411 Malignant neoplasm of upper-outer quadrant of right female breast: Secondary | ICD-10-CM

## 2019-03-08 DIAGNOSIS — Z170 Estrogen receptor positive status [ER+]: Secondary | ICD-10-CM

## 2019-03-09 ENCOUNTER — Telehealth: Payer: Self-pay | Admitting: *Deleted

## 2019-03-09 ENCOUNTER — Telehealth (HOSPITAL_COMMUNITY): Payer: Self-pay

## 2019-03-09 NOTE — Telephone Encounter (Signed)
2nd PA attempt for Bystolic 20mg  submitted. Key [id number]

## 2019-03-09 NOTE — Telephone Encounter (Signed)
Left vm for pt to return call assess needs and confirm future appts. Sent verified email as well.

## 2019-03-10 NOTE — Telephone Encounter (Signed)
LM FOR PATIENT TO CALL OFFICE TO DISCUSS OUTCOME OF PA FOR BYSTOLIC.

## 2019-03-11 ENCOUNTER — Encounter (HOSPITAL_COMMUNITY)
Admission: RE | Admit: 2019-03-11 | Discharge: 2019-03-11 | Disposition: A | Discharge status: Home / Self Care | Payer: BLUE CROSS/BLUE SHIELD | Source: Ambulatory Visit | Attending: Oncology | Admitting: Oncology

## 2019-03-11 ENCOUNTER — Other Ambulatory Visit: Payer: Self-pay

## 2019-03-11 DIAGNOSIS — C50411 Malignant neoplasm of upper-outer quadrant of right female breast: Secondary | ICD-10-CM | POA: Insufficient documentation

## 2019-03-11 DIAGNOSIS — Z170 Estrogen receptor positive status [ER+]: Secondary | ICD-10-CM

## 2019-03-11 DIAGNOSIS — M899 Disorder of bone, unspecified: Secondary | ICD-10-CM | POA: Diagnosis not present

## 2019-03-11 DIAGNOSIS — C50911 Malignant neoplasm of unspecified site of right female breast: Secondary | ICD-10-CM | POA: Diagnosis not present

## 2019-03-11 LAB — GLUCOSE, CAPILLARY: Glucose-Capillary: 48 mg/dL — ABNORMAL LOW (ref 70–99)

## 2019-03-11 MED ORDER — FLUDEOXYGLUCOSE F - 18 (FDG) INJECTION
10.9000 | Freq: Once | INTRAVENOUS | Status: AC | PRN
  Administered 2019-03-11: 10.9 via INTRAVENOUS

## 2019-03-15 LAB — GLUCOSE, CAPILLARY: Glucose-Capillary: 42 mg/dL — CL (ref 70–99)

## 2019-03-18 ENCOUNTER — Other Ambulatory Visit: Payer: 59

## 2019-03-18 ENCOUNTER — Ambulatory Visit: Payer: 59

## 2019-03-23 ENCOUNTER — Telehealth: Payer: Self-pay | Admitting: *Deleted

## 2019-03-23 NOTE — Progress Notes (Signed)
[place]  [phone] [phone]    ID: [name] [name] DOB: [birth year]  MR#: [id number]  MWN#:[id number]  Patient Care Team: Marda Stalker, PA-C as PCP - General (Family Medicine) Jerline Pain, MD as PCP - Cardiology (Cardiology) Magrinat, Virgie Dad, MD as Consulting Physician (Oncology) Loreta Ave, MD as Referring Physician (Internal Medicine) Donato Heinz, MD as Consulting Physician (Nephrology) Kyung Rudd, MD as Consulting Physician (Radiation Oncology) Jovita Kussmaul, MD as Consulting Physician (General Surgery) Larey Dresser, MD as Consulting Physician (Cardiology) Mauro Kaufmann, RN as Oncology Nurse Navigator Rockwell Germany, RN as Oncology Nurse Navigator OTHER MD:   CHIEF COMPLAINT: Triple positive breast cancer  CURRENT TREATMENT: Trastuzumab; anastrozole   HISTORY OF CURRENT ILLNESS: From the original intake note:  [name] Scaffidi palpated a lump on 01/15/2018. She felt soreness and shooting pain under her arm and in the right breast. She underwent bilateral diagnostic mammography with tomography and right breast ultrasonography at [facility] on 03/12/2018 showing: breast density category C. The is architectural distortion at the 11 o'clock position. There is also an oval mass in the right breast anterior depth. Examination of the right axilla showed enlarged lymph nodes. Ultrasonography showed a 4.6 cm mass in the right breast upper outer quadrant posterior depth. An additional 1.2 cm oval mass in the right breast 12 o'clock middle depth. The lymph nodes in the right axillary are highly suggestive of malignancy.   Accordingly on 03/16/2018 she proceeded to biopsy of the right breast area in question. The pathology from this procedure showed ([id number]): At both the 11 and 12 o'clock positions: Invasive ductal carcinoma grade II. Ductal carcinoma in situ, high grade, with necrosis and calcifications. Prognostic indicators  significant for: estrogen receptor, 90% positive and progesterone receptor, 40% positive, both with strong staining intensity. Proliferation marker Ki67 at 80%. HER2 amplified with ratios HER2/CEP17 SIGNALS 6.90 and average HER2 copies per cell 14.50  The patient's subsequent history is as detailed below.   INTERVAL HISTORY: [name] returns today for follow-up and treatment of her triple positive breast cancer.  She continues on trastuzumab. Today is day 1 cycle 11.  She has expressed a concern regarding coming here to get treated and today we will discuss changing her trastuzumab to every 4 weeks instead of every 3. She tolerates this well and without any noticeable side effects.    Adan's last echocardiogram on 11/19/2018, showed an ejection fraction in the 60% - 65% range.   She also continues on anastrozole. She wakes up with diaphoresis from hot flashes at night. Her appetite is increased as well.   Since her last visit here, she underwent a PET scan on 03/11/2019 showing: Hypermetabolic bony lytic lesions of the right upper sternal manubrium; the right T3 posterior elements; and of the T5 spinous process, compatible with bony malignancy. A hypermetabolic focus in the right mandible appears to most closely correspond with a periapical lucency and cavity of tooth # 31, and accordingly is most likely due to dental disease rather than malignancy. Postoperative and post radiation therapy findings in the right chest wall and right anterior lung. Mild cardiomegaly.   REVIEW OF SYSTEMS: [name] has been at home amid the COVID-19 pandemic. Her 3 children are also at home participating in online school. She notes that her blood pressure has been slightly elevated at home. For exercise, she walks the neighborhood with her children every day. She is thankful for this time because she gets to spend more time with  her family than she has in the past. She has been participating in bible study online in the  mornings as well. For pain, she takes Tylenol PM as needed, but she tries to avoid it. She notes that a right tooth that fell out over the summer of 2019.   The patient denies unusual headaches, visual changes, nausea, vomiting, or dizziness. There has been no unusual cough, phlegm production, or pleurisy. This been no change in bowel or bladder habits. The patient denies unexplained fatigue or unexplained weight loss, bleeding, rash, or fever. A detailed review of systems was otherwise noncontributory.    PAST MEDICAL HISTORY: Past Medical History:  Diagnosis Date  . Anxiety   . Breast cancer, left breast ([place]) 2000   Underwent lumpectomy, chemotherapy and radiation  . Facial paralysis/Bells palsy    left side  . Family history of lung cancer   . Family history of lymphoma   . Hypertension   . Neuropathy    IN FINGERS AND TOES   RECENT INFUSION OF CHEMO  . Personal history of breast cancer 05/06/2018  . Renal disorder    just after TIA acute kidney injury  . TIA (transient ischemic attack) 03/16/2017   [facility]   The patient has a previous history of left breast cancer diagnosed in 2000. She was seen by [facility] in [place], [place] under Dr. Loreta Ave. According to the patient, her left breast cancer was stage II with no lymph node involvement (likely T2N0). She had chemotherapy every 3 weeks for about 6 months. She did not takes any "red drugs." She also did not take anti-estrogens (likely ER/PR negative).   TIA in 2018. She saw a neurologist as a precaution. She denies any clotting issues.    PAST SURGICAL HISTORY: Past Surgical History:  Procedure Laterality Date  . BREAST LUMPECTOMY WITH AXILLARY LYMPH NODE BIOPSY Left 2000   biopsy  . BREAST SURGERY     partial mastectomy with lymph node removal  . MASTECTOMY MODIFIED RADICAL Right 08/26/2018   Procedure: MASTECTOMY MODIFIED RADICAL;  Surgeon: Jovita Kussmaul, MD;  Location: [place];  Service: General;   Laterality: Right;  . MODIFIED MASTECTOMY Right 08/26/2018  . PORTACATH PLACEMENT Left 04/08/2018   Procedure: INSERTION PORT-A-CATH;  Surgeon: Jovita Kussmaul, MD;  Location: [place];  Service: General;  Laterality: Left;  . TUBAL LIGATION Bilateral 2004  . WISDOM TOOTH EXTRACTION      FAMILY HISTORY Family History  Problem Relation Age of Onset  . Hypertension Mother   . Hypertension Father   . Diverticulosis Father   . Diabetes Father   . Lung cancer Father 71       hx smoking  . Hypertension Maternal Grandmother   . CAD Maternal Grandmother   . Alzheimer's disease Maternal Grandmother        died at 4  . Hypertension Paternal Grandmother   . CAD Paternal Grandmother   . Stroke Paternal Grandfather   . Sickle cell trait Paternal Grandfather   . Pneumonia Maternal Aunt        died at a young adult  . Lymphoma Paternal Aunt 65   The patient's father is alive at age 66 and had a history of lung cancer. The patient's mother is alive at age 52. The patient has 1 brother and no sisters. She denies a family history of breast or ovarian cancer.     GYNECOLOGIC HISTORY:  No LMP recorded. (Menstrual status: Perimenopausal). Menarche: 48  years old Age at first live birth: 48 years old She is GXP3. She is no longer having periods, which were irregular and light. Her LMP was  January 2019. She is not having hot flashes. The patient used oral contraceptive from [phone] and the Depo-provera shot from [phone] with no complications. She never used HRT.    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 Her husband, [name], is a Administrator. The patient's son, Shea Stakes age 4, works at [employer] in [place]. The patient's daughters, Lilia Pro age 79, and Levonne Spiller age 23, are students. The patient's adopted son, Vonna Kotyk age 42, is also a Ship broker.    ADVANCED DIRECTIVES:    HEALTH MAINTENANCE: Social History   Tobacco Use  . Smoking status: Never Smoker  . Smokeless  tobacco: Never Used  Substance Use Topics  . Alcohol use: No  . Drug use: No     Colonoscopy: n/a  PAP: 2015  Bone density: never done   Allergies  Allergen Reactions  . Ace Inhibitors Swelling    Swelling of lips and face  . Lisinopril Swelling    Swelling of lips, face  . Amlodipine Swelling    Leg swelling  . Adhesive [Tape] Itching and Rash    Please use paper tape  . Latex Itching and Rash    Current Outpatient Medications  Medication Sig Dispense Refill  . anastrozole (ARIMIDEX) 1 MG tablet Take 1 tablet (1 mg total) by mouth daily. 90 tablet 4  . aspirin EC 81 MG tablet Take 81 mg by mouth daily.    . carvedilol (COREG) 3.125 MG tablet Take 1 tablet (3.125 mg total) by mouth 2 (two) times daily with a meal. 60 tablet 6  . Cyanocobalamin (VITAMIN B12) 500 MCG TABS Take 500 mcg by mouth daily.     . ferrous sulfate 325 (65 FE) MG tablet Take 1 tablet (325 mg total) by mouth daily. 30 tablet 0  . gabapentin (NEURONTIN) 300 MG capsule Take 1 capsule (300 mg total) by mouth at bedtime. 90 capsule 4  . lidocaine-prilocaine (EMLA) cream Apply 1 application topically as needed.    Marland Kitchen losartan (COZAAR) 50 MG tablet Take 1 tablet (50 mg total) by mouth daily. 30 tablet 11  . Multiple Vitamin (MULITIVITAMIN WITH MINERALS) TABS Take 1 tablet by mouth daily.      . Nebivolol HCl (BYSTOLIC) 20 MG TABS Take 1 tablet (20 mg total) by mouth daily. 90 tablet 3  . spironolactone (ALDACTONE) 50 MG tablet TAKE 1 TABLET (50 MG TOTAL) BY MOUTH DAILY. 30 tablet 6   No current facility-administered medications for this visit.     OBJECTIVE: Middle-aged African-American woman who appears well  Vitals:   03/24/19 1245  BP: 135/81  Pulse: 88  Resp: 18  Temp: 98.2 F (36.8 C)  SpO2: 100%     Body mass index is 45.32 kg/m.   Wt Readings from Last 3 Encounters:  03/24/19 224 lb 6.4 oz (101.8 kg)  02/25/19 221 lb 9.6 oz (100.5 kg)  01/18/19 215 lb (97.5 kg)  ECOG FS:1 - Symptomatic but  completely ambulatory  Sclerae unicteric, EOMs intact No cervical or supraclavicular adenopathy Lungs no rales or rhonchi Heart regular rate and rhythm Abd soft, nontender, positive bowel sounds MSK no focal spinal tenderness, no upper extremity lymphedema Neuro: nonfocal, well oriented, appropriate affect Breasts: The right breast is status post mastectomy and radiation.  There is some hyperpigmentation over the radiation port area but  no evidence of local recurrence.  The left breast is benign.  Both axillae are benign.  LAB RESULTS:  CMP     Component Value Date/Time   NA 138 03/24/2019 1207   K 3.7 03/24/2019 1207   CL 103 03/24/2019 1207   CO2 25 03/24/2019 1207   GLUCOSE 92 03/24/2019 1207   BUN 22 (H) 03/24/2019 1207   CREATININE 1.33 (H) 03/24/2019 1207   CREATININE 1.29 (H) 03/25/2018 1129   CALCIUM 10.8 (H) 03/24/2019 1207   CALCIUM 10.2 06/25/2018 1425   PROT 7.3 03/24/2019 1207   ALBUMIN 3.4 (L) 03/24/2019 1207   AST 24 03/24/2019 1207   AST 22 03/25/2018 1129   ALT 30 03/24/2019 1207   ALT 29 03/25/2018 1129   ALKPHOS 128 (H) 03/24/2019 1207   BILITOT 0.3 03/24/2019 1207   BILITOT 0.3 03/25/2018 1129   GFRNONAA 47 (L) 03/24/2019 1207   GFRNONAA 49 (L) 03/25/2018 1129   GFRAA 55 (L) 03/24/2019 1207   GFRAA 57 (L) 03/25/2018 1129    Lab Results  Component Value Date   TOTALPROTELP 7.0 02/25/2019     Lab Results  Component Value Date   KPAFRELGTCHN 76.7 (H) 02/25/2019   LAMBDASER 27.2 (H) 02/25/2019   KAPLAMBRATIO 2.82 (H) 02/25/2019    Lab Results  Component Value Date   WBC 4.1 03/24/2019   NEUTROABS 2.4 03/24/2019   HGB 11.1 (L) 03/24/2019   HCT 34.7 (L) 03/24/2019   MCV 86.3 03/24/2019   PLT 163 03/24/2019    _0 @  No results found for: LABCA2  No components found for: QZESPQ330  No results for input(s): INR in the last 168 hours.  No results found for: LABCA2  No results found for: CAN199  No results found for:  QTM226  No results found for: JFH545  Lab Results  Component Value Date   CA2729 38.7 (H) 02/25/2019    No components found for: HGQUANT  No results found for: CEA1 / No results found for: CEA1   No results found for: AFPTUMOR  No results found for: CHROMOGRNA  No results found for: PSA1  Appointment on 03/24/2019  Component Date Value Ref Range Status  . WBC 03/24/2019 4.1  4.0 - 10.5 K/uL Final  . RBC 03/24/2019 4.02  3.87 - 5.11 MIL/uL Final  . Hemoglobin 03/24/2019 11.1* 12.0 - 15.0 g/dL Final  . HCT 03/24/2019 34.7* 36.0 - 46.0 % Final  . MCV 03/24/2019 86.3  80.0 - 100.0 fL Final  . MCH 03/24/2019 27.6  26.0 - 34.0 pg Final  . MCHC 03/24/2019 32.0  30.0 - 36.0 g/dL Final  . RDW 03/24/2019 15.9* 11.5 - 15.5 % Final  . Platelets 03/24/2019 163  150 - 400 K/uL Final  . nRBC 03/24/2019 0.0  0.0 - 0.2 % Final  . Neutrophils Relative % 03/24/2019 58  % Final  . Neutro Abs 03/24/2019 2.4  1.7 - 7.7 K/uL Final  . Lymphocytes Relative 03/24/2019 21  % Final  . Lymphs Abs 03/24/2019 0.9  0.7 - 4.0 K/uL Final  . Monocytes Relative 03/24/2019 17  % Final  . Monocytes Absolute 03/24/2019 0.7  0.1 - 1.0 K/uL Final  . Eosinophils Relative 03/24/2019 2  % Final  . Eosinophils Absolute 03/24/2019 0.1  0.0 - 0.5 K/uL Final  . Basophils Relative 03/24/2019 1  % Final  . Basophils Absolute 03/24/2019 0.0  0.0 - 0.1 K/uL Final  . Immature Granulocytes 03/24/2019 1  % Final  . Abs Immature  Granulocytes 03/24/2019 0.03  0.00 - 0.07 K/uL Final   Performed at [facility], [place] [address] [place], [place] [address]  . Sodium 03/24/2019 138  135 - 145 mmol/L Final  . Potassium 03/24/2019 3.7  3.5 - 5.1 mmol/L Final  . Chloride 03/24/2019 103  98 - 111 mmol/L Final  . CO2 03/24/2019 25  22 - 32 mmol/L Final  . Glucose, Bld 03/24/2019 92  70 - 99 mg/dL Final  . BUN 03/24/2019 22* 6 - 20 mg/dL Final  . Creatinine, Ser 03/24/2019 1.33* 0.44 - 1.00 mg/dL Final  .  Calcium 03/24/2019 10.8* 8.9 - 10.3 mg/dL Final  . Total Protein 03/24/2019 7.3  6.5 - 8.1 g/dL Final  . Albumin 03/24/2019 3.4* 3.5 - 5.0 g/dL Final  . AST 03/24/2019 24  15 - 41 U/L Final  . ALT 03/24/2019 30  0 - 44 U/L Final  . Alkaline Phosphatase 03/24/2019 128* 38 - 126 U/L Final  . Total Bilirubin 03/24/2019 0.3  0.3 - 1.2 mg/dL Final  . GFR calc non Af Amer 03/24/2019 47* >60 mL/min Final  . GFR calc Af Amer 03/24/2019 55* >60 mL/min Final  . Anion gap 03/24/2019 10  5 - 15 Final   Performed at [facility], [address] [place],  [address]    (this displays the last labs from the last 3 days)  Lab Results  Component Value Date   TOTALPROTELP 7.0 02/25/2019   (this displays SPEP labs)  Lab Results  Component Value Date   KPAFRELGTCHN 76.7 (H) 02/25/2019   LAMBDASER 27.2 (H) 02/25/2019   KAPLAMBRATIO 2.82 (H) 02/25/2019   (kappa/lambda light chains)  No results found for: HGBA, HGBA2QUANT, HGBFQUANT, HGBSQUAN (Hemoglobinopathy evaluation)   No results found for: LDH  No results found for: IRON, TIBC, IRONPCTSAT (Iron and TIBC)  No results found for: FERRITIN  Urinalysis    Component Value Date/Time   COLORURINE STRAW (A) 03/17/2017 0600   APPEARANCEUR CLEAR (A) 03/17/2017 0600   LABSPEC 1.031 (H) 03/17/2017 0600   PHURINE 5.0 03/17/2017 0600   GLUCOSEU NEGATIVE 03/17/2017 0600   HGBUR NEGATIVE 03/17/2017 0600   BILIRUBINUR NEGATIVE 03/17/2017 0600   KETONESUR NEGATIVE 03/17/2017 0600   PROTEINUR NEGATIVE 03/17/2017 0600   UROBILINOGEN 0.2 11/29/2011 1656   NITRITE NEGATIVE 03/17/2017 0600   LEUKOCYTESUR NEGATIVE 03/17/2017 0600     STUDIES: Nm Pet Image Initial (pi) Skull Base To Thigh  Result Date: 03/12/2019 CLINICAL DATA:  Subsequent treatment strategy for right breast cancer. Bone lesions of uncertain etiology. EXAM: NUCLEAR MEDICINE PET SKULL BASE TO THIGH TECHNIQUE: 10.9 mCi F-18 FDG was injected  intravenously. Full-ring PET imaging was performed from the skull base to thigh after the radiotracer. CT data was obtained and used for attenuation correction and anatomic localization. Fasting blood glucose: 42 mg/dl COMPARISON:  Multiple exams, including CT examinations of 02/04/2019 FINDINGS: Mediastinal blood pool activity: SUV max 2.8 NECK: No significant abnormal hypermetabolic activity in this region. Incidental CT findings: none CHEST: Right mastectomy with a lateral 3.9 by 2.2 cm irregularly marginated soft tissue density with some internal hypodensity, with a maximum SUV of 3.6, probably from local scarring. Incidental CT findings: Left Port-A-Cath tip: SVC. Mild cardiomegaly. Radiation therapy related findings anteriorly in the right lung. A right upper paratracheal node measures 0.8 cm in short axis with maximum SUV of 2.2, below blood pool levels. ABDOMEN/PELVIS: No significant abnormal hypermetabolic activity in this region. Incidental CT findings: Small  metal density in the right mesentery on image 132/4, probably a migrated tubal ligation clip. SKELETON: Pathologic fracture along the right upper sternal manubrium with underlying lytic lesion, maximum SUV 11.9. Hypermetabolic lytic lesion of the right T3 lamina, spinous process, and transverse process with maximum SUV 10.7. Expansile lesion of the T5 spinous process, maximum SUV 5.5. Hypermetabolic focus in the right mandible with some adjacent sclerosis but also a periapical lucency in cavity of the right middle lower (tooth # 31) immediately in this vicinity, maximum SUV 7.6. Incidental CT findings: none IMPRESSION: 1. Hypermetabolic bony lytic lesions of the right upper sternal manubrium; the right T3 posterior elements; and of the T5 spinous process, compatible with bony malignancy. 2. A hypermetabolic focus in the right mandible appears to most closely correspond with a periapical lucency and cavity of tooth # 31, and accordingly is most likely  due to dental disease rather than malignancy. 3. Postoperative and post radiation therapy findings in the right chest wall and right anterior lung. 4. Mild cardiomegaly. Electronically Signed   By: Van Clines M.D.   On: 03/12/2019 08:08    ELIGIBLE FOR AVAILABLE RESEARCH PROTOCOL: no   ASSESSMENT: 48 y.o. [place], [place] woman  (1) status post left lumpectomy in 2000 for a (?) T2N0 breast cancer,  (a) status post adjuvant chemotherapy  (b) status post adjuvant radiation  (c) did not receive antiestrogens  (2) genetics testing May 18, 2018 through the common Hereditary Cancers Panel + Myelodysplastic Syndrome/Leukemia Panel found no deleterious mutations in APC, ATM, AXIN2, BARD1, BLM, BMPR1A, BRCA1, BRCA2, BRIP1, CDH1, CDK4, CDKN2A (p14ARF), CDKN2A (p16INK4a), CEBPA, CHEK2, CTNNA1, DICER1, EPCAM*, GATA2, GREM1*, HRAS, KIT, MEN1, MLH1, MSH2, MSH3, MSH6, MUTYH, NBN, NF1, PALB2, PDGFRA, PMS2, POLD1, POLE, PTEN, RAD50, RAD51C, RAD51D, RUNX1, SDHB, SDHC, SDHD, SMAD4, SMARCA4, STK11, TERC, TERT, TP53, TSC1, TSC2, VHL The following genes were evaluated for sequence changes only: HOXB13*, NTHL1*, SDHA  (a)  3 variants of uncertain significance were identified in the genes BARD1 c.764A>G (p.Asn255Ser), BRIP1 c.2563C>T (p.Arg855Cys), and PALB2 c.3103A>T (p.Ile1035Phe).   (3) status post right breast biopsy 03/16/2018 for a clinical mT2-3 N2, anatomic stage III invasive ductal carcinoma, grade 2, triple positive, with an MIB-1 of 80%.  (a) bone scan and chest CT scan negative for metastases except for a T6 lytic lesion of concern  (b) thoracic spine MRI was ordered May 2019 but never performed.  (4) neoadjuvant chemotherapy consisting of carboplatin, docetaxel, trastuzumab and Pertuzumab every 3 weeks x 6 starting 04/16/2018  (a) pertuzumab held beginning with cycle 2 because of diarrhea  (5) trastuzumab to be continued indefinitely given diagnosis of stage IV disease below  (a) echocardiogram on  06/29/2018 showed an ejection fraction in the 65-70% range  (b) echocardiogram 11/19/2018 showed an ejection fraction in the 60-65% range  (6) right mastectomy and sentinel lymph node sampling on 08/26/2018 found a residual 2.5cm invasive ductal carcinoma, with 2 of 5 sentinel lymph nodes positive, ypT2, ypN1a; margins were clear  (7) adjuvant radiation started 10/02/2018  (8) anastrozole started 12/03/2018  (a) [place] on 10/01/2018 was 64.3, and estradiol 7.0, consistent with menopause  (b) referral to pelvic floor rehabilitation 12/03/2018  METASTATIC DISEASE: March 2020 (9) CT of neck and CT angiography of the chest 02/04/2019 finds the T6 lytic lesion noted May 2019 (see #3 above) is now sclerotic; a sclerotic T3 lesion is stable; there is a new lytic lesion in the manubrium  (a) PET scan 03/12/2019 shows manubrium, T3 and T5 metastases, no visceral  disease  (b) biopsy of manubrial lesion pending   PLAN: [name] currently has no symptoms related to her cancer.  She understands that she does have stage IV disease, although we have not yet been able to confirm this pathologically.  The sternal biopsy likely will not be done until June given the current pandemic.  The good news from the PET scan is that she has no visceral disease.  The PET scan does show an area in the right jaw where she lost a tooth sometime ago.  She should see her dentist to make sure there is no abscess or other complication.  Her white cell count is not elevated, she has no pain there, and no fever.  While we could add a CPK inhibitor at this point, I would prefer to wait and continue the anastrozole and trastuzumab alone and see if this allows for good tumor control.  We can always add more agents later  Her insurance no longer will pay for diastolic.  I have switched her to Coreg.  For her nighttime hot flashes I am starting her on gabapentin at bedtime  We are moving the trastuzumab to every 4 weeks and she will be on  this indefinitely.  She knows to call for any other issues that may develop before the next visit with me which will be in 2 months.       Magrinat, Virgie Dad, MD  03/24/19 1:17 PM Medical Oncology and Hematology [facility] [address] [place], [place] [address] Tel. [phone]    Fax. [phone]  I, Jacqualyn Posey am acting as a Education administrator for Chauncey Cruel, MD.   I, Lurline Del MD, have reviewed the above documentation for accuracy and completeness, and I agree with the above.

## 2019-03-23 NOTE — Telephone Encounter (Signed)
This RN attempted to reach pt a second time per email from navigator - whom [place] communicated with stating she would like to cancel appt for infusion - and do visit by webex.  Per MD review- his recommendation is to keep visit  - this RN left detailed VM and informed navigator.

## 2019-03-23 NOTE — Telephone Encounter (Signed)
This RN left message on pt's VM due to her communication with tech stating she would like to cancel appointments tomorrow requesting a return call to discuss any concerns.  Pt is scheduled for lab, MD and herceptin.

## 2019-03-24 ENCOUNTER — Inpatient Hospital Stay (HOSPITAL_BASED_OUTPATIENT_CLINIC_OR_DEPARTMENT_OTHER): Payer: BLUE CROSS/BLUE SHIELD | Admitting: Oncology

## 2019-03-24 ENCOUNTER — Inpatient Hospital Stay: Payer: BLUE CROSS/BLUE SHIELD

## 2019-03-24 ENCOUNTER — Inpatient Hospital Stay: Payer: BLUE CROSS/BLUE SHIELD | Attending: Oncology

## 2019-03-24 ENCOUNTER — Other Ambulatory Visit: Payer: Self-pay

## 2019-03-24 VITALS — BP 135/81 | HR 88 | Temp 98.2°F | Resp 18 | Ht 59.0 in | Wt 224.4 lb

## 2019-03-24 DIAGNOSIS — C7951 Secondary malignant neoplasm of bone: Secondary | ICD-10-CM | POA: Insufficient documentation

## 2019-03-24 DIAGNOSIS — K029 Dental caries, unspecified: Secondary | ICD-10-CM

## 2019-03-24 DIAGNOSIS — I517 Cardiomegaly: Secondary | ICD-10-CM

## 2019-03-24 DIAGNOSIS — C50811 Malignant neoplasm of overlapping sites of right female breast: Secondary | ICD-10-CM

## 2019-03-24 DIAGNOSIS — Z170 Estrogen receptor positive status [ER+]: Secondary | ICD-10-CM

## 2019-03-24 DIAGNOSIS — C50411 Malignant neoplasm of upper-outer quadrant of right female breast: Secondary | ICD-10-CM

## 2019-03-24 DIAGNOSIS — Z801 Family history of malignant neoplasm of trachea, bronchus and lung: Secondary | ICD-10-CM

## 2019-03-24 DIAGNOSIS — Z807 Family history of other malignant neoplasms of lymphoid, hematopoietic and related tissues: Secondary | ICD-10-CM

## 2019-03-24 DIAGNOSIS — Z95828 Presence of other vascular implants and grafts: Secondary | ICD-10-CM

## 2019-03-24 DIAGNOSIS — Z5112 Encounter for antineoplastic immunotherapy: Secondary | ICD-10-CM | POA: Diagnosis not present

## 2019-03-24 DIAGNOSIS — Z79811 Long term (current) use of aromatase inhibitors: Secondary | ICD-10-CM

## 2019-03-24 DIAGNOSIS — N951 Menopausal and female climacteric states: Secondary | ICD-10-CM | POA: Diagnosis not present

## 2019-03-24 LAB — COMPREHENSIVE METABOLIC PANEL
ALT: 30 U/L (ref 0–44)
AST: 24 U/L (ref 15–41)
Albumin: 3.4 g/dL — ABNORMAL LOW (ref 3.5–5.0)
Alkaline Phosphatase: 128 U/L — ABNORMAL HIGH (ref 38–126)
Anion gap: 10 (ref 5–15)
BUN: 22 mg/dL — ABNORMAL HIGH (ref 6–20)
CO2: 25 mmol/L (ref 22–32)
Calcium: 10.8 mg/dL — ABNORMAL HIGH (ref 8.9–10.3)
Chloride: 103 mmol/L (ref 98–111)
Creatinine, Ser: 1.33 mg/dL — ABNORMAL HIGH (ref 0.44–1.00)
GFR calc Af Amer: 55 mL/min — ABNORMAL LOW (ref >60)
GFR calc non Af Amer: 47 mL/min — ABNORMAL LOW (ref >60)
Glucose, Bld: 92 mg/dL (ref 70–99)
Potassium: 3.7 mmol/L (ref 3.5–5.1)
Sodium: 138 mmol/L (ref 135–145)
Total Bilirubin: 0.3 mg/dL (ref 0.3–1.2)
Total Protein: 7.3 g/dL (ref 6.5–8.1)

## 2019-03-24 LAB — CBC WITH DIFFERENTIAL/PLATELET
Abs Immature Granulocytes: 0.03 10*3/uL (ref 0.00–0.07)
Basophils Absolute: 0 10*3/uL (ref 0.0–0.1)
Basophils Relative: 1 %
Eosinophils Absolute: 0.1 10*3/uL (ref 0.0–0.5)
Eosinophils Relative: 2 %
HCT: 34.7 % — ABNORMAL LOW (ref 36.0–46.0)
Hemoglobin: 11.1 g/dL — ABNORMAL LOW (ref 12.0–15.0)
Immature Granulocytes: 1 %
Lymphocytes Relative: 21 %
Lymphs Abs: 0.9 10*3/uL (ref 0.7–4.0)
MCH: 27.6 pg (ref 26.0–34.0)
MCHC: 32 g/dL (ref 30.0–36.0)
MCV: 86.3 fL (ref 80.0–100.0)
Monocytes Absolute: 0.7 10*3/uL (ref 0.1–1.0)
Monocytes Relative: 17 %
Neutro Abs: 2.4 10*3/uL (ref 1.7–7.7)
Neutrophils Relative %: 58 %
Platelets: 163 10*3/uL (ref 150–400)
RBC: 4.02 MIL/uL (ref 3.87–5.11)
RDW: 15.9 % — ABNORMAL HIGH (ref 11.5–15.5)
WBC: 4.1 10*3/uL (ref 4.0–10.5)
nRBC: 0 % (ref 0.0–0.2)

## 2019-03-24 MED ORDER — SODIUM CHLORIDE 0.9% FLUSH
10.0000 mL | INTRAVENOUS | Status: DC | PRN
  Administered 2019-03-24: 10 mL
  Filled 2019-03-24: qty 10

## 2019-03-24 MED ORDER — CARVEDILOL 3.125 MG PO TABS: 3.1250 mg | ORAL_TABLET | Freq: Two times a day (BID) | ORAL | 6 refills | Status: DC

## 2019-03-24 MED ORDER — DIPHENHYDRAMINE HCL 25 MG PO CAPS
ORAL_CAPSULE | ORAL | Status: AC
  Filled 2019-03-24: qty 1

## 2019-03-24 MED ORDER — ACETAMINOPHEN 325 MG PO TABS
650.0000 mg | ORAL_TABLET | Freq: Once | ORAL | Status: AC
  Administered 2019-03-24: 650 mg via ORAL

## 2019-03-24 MED ORDER — GABAPENTIN 300 MG PO CAPS: 300.0000 mg | ORAL_CAPSULE | Freq: Every day | ORAL | 4 refills | Status: DC

## 2019-03-24 MED ORDER — HEPARIN SOD (PORK) LOCK FLUSH 100 UNIT/ML IV SOLN
500.0000 [IU] | Freq: Once | INTRAVENOUS | Status: AC | PRN
  Administered 2019-03-24: 500 [IU]
  Filled 2019-03-24: qty 5

## 2019-03-24 MED ORDER — TRASTUZUMAB CHEMO 150 MG IV SOLR
750.0000 mg | Freq: Once | INTRAVENOUS | Status: AC
  Administered 2019-03-24: 750 mg via INTRAVENOUS
  Filled 2019-03-24: qty 35.72

## 2019-03-24 MED ORDER — SODIUM CHLORIDE 0.9 % IV SOLN
Freq: Once | INTRAVENOUS | Status: AC
  Administered 2019-03-24: 13:00:00 via INTRAVENOUS
  Filled 2019-03-24: qty 250

## 2019-03-24 MED ORDER — SODIUM CHLORIDE 0.9% FLUSH
10.0000 mL | Freq: Once | INTRAVENOUS | Status: AC
  Administered 2019-03-24: 10 mL
  Filled 2019-03-24: qty 10

## 2019-03-24 MED ORDER — ACETAMINOPHEN 325 MG PO TABS
ORAL_TABLET | ORAL | Status: AC
  Filled 2019-03-24: qty 2

## 2019-03-24 MED ORDER — DIPHENHYDRAMINE HCL 25 MG PO CAPS
25.0000 mg | ORAL_CAPSULE | Freq: Once | ORAL | Status: AC
  Administered 2019-03-24: 25 mg via ORAL

## 2019-03-24 NOTE — Patient Instructions (Signed)
[facility] Discharge Instructions for Patients Receiving Chemotherapy  Today you received the following chemotherapy agents: Herceptin  To help prevent nausea and vomiting after your treatment, we encourage you to take your nausea medication as directed.   If you develop nausea and vomiting that is not controlled by your nausea medication, call the clinic.   BELOW ARE SYMPTOMS THAT SHOULD BE REPORTED IMMEDIATELY:  *FEVER GREATER THAN 100.5 F  *CHILLS WITH OR WITHOUT FEVER  NAUSEA AND VOMITING THAT IS NOT CONTROLLED WITH YOUR NAUSEA MEDICATION  *UNUSUAL SHORTNESS OF BREATH  *UNUSUAL BRUISING OR BLEEDING  TENDERNESS IN MOUTH AND THROAT WITH OR WITHOUT PRESENCE OF ULCERS  *URINARY PROBLEMS  *BOWEL PROBLEMS  UNUSUAL RASH Items with * indicate a potential emergency and should be followed up as soon as possible.  Feel free to call the clinic should you have any questions or concerns. The clinic phone number is [phone].  Please show the CHEMO ALERT CARD at check-in to the Emergency Department and triage nurse.  Coronavirus (COVID-19) Are you at risk?  Are you at risk for the Coronavirus (COVID-19)?  To be considered HIGH RISK for Coronavirus (COVID-19), you have to meet the following criteria:  . Traveled to [place], [place], [place], [place] or [place]; or in the [place] to [place], [place], [place], or [place]; and have fever, cough, and shortness of breath within the last 2 weeks of travel OR . Been in close contact with a person diagnosed with COVID-19 within the last 2 weeks and have fever, cough, and shortness of breath . IF YOU DO NOT MEET THESE CRITERIA, YOU ARE CONSIDERED LOW RISK FOR COVID-19.  What to do if you are HIGH RISK for COVID-19?  . If you are having a medical emergency, call 911. . Seek medical care right away. Before you go to a doctor's office, urgent care or emergency department, call ahead and tell them about your  recent travel, contact with someone diagnosed with COVID-19, and your symptoms. You should receive instructions from your physician's office regarding next steps of care.  . When you arrive at healthcare provider, tell the healthcare staff immediately you have returned from visiting [place], [place], [place], [place] or [place]; or traveled in the [place] to [place], [place], [place], or [place]; in the last two weeks or you have been in close contact with a person diagnosed with COVID-19 in the last 2 weeks.   . Tell the health care staff about your symptoms: fever, cough and shortness of breath. . After you have been seen by a medical provider, you will be either: o Tested for (COVID-19) and discharged home on quarantine except to seek medical care if symptoms worsen, and asked to  - Stay home and avoid contact with others until you get your results (4-5 days)  - Avoid travel on public transportation if possible (such as bus, train, or airplane) or o Sent to the Emergency Department by EMS for evaluation, COVID-19 testing, and possible admission depending on your condition and test results.  What to do if you are LOW RISK for COVID-19?  Reduce your risk of any infection by using the same precautions used for avoiding the common cold or flu:  . Wash your hands often with soap and warm water for at least 20 seconds.  If soap and water are not readily available, use an alcohol-based hand sanitizer with at least 60% alcohol.  . If coughing or sneezing,   cover your mouth and nose by coughing or sneezing into the elbow areas of your shirt or coat, into a tissue or into your sleeve (not your hands). . Avoid shaking hands with others and consider head nods or verbal greetings only. . Avoid touching your eyes, nose, or mouth with unwashed hands.  . Avoid close contact with people who are sick. . Avoid places or events with large numbers of people in one location, like concerts or sporting  events. . Carefully consider travel plans you have or are making. . If you are planning any travel outside or inside the [place], visit the CDC's Travelers' Health webpage for the latest health notices. . If you have some symptoms but not all symptoms, continue to monitor at home and seek medical attention if your symptoms worsen. . If you are having a medical emergency, call 911.   ADDITIONAL HEALTHCARE OPTIONS FOR PATIENTS  [facility] / e-Visit: [email or web address]         [facility]: [phone]  [facility]: [phone]                   [facility]: [phone]     

## 2019-03-25 ENCOUNTER — Encounter: Payer: Self-pay | Admitting: *Deleted

## 2019-03-25 LAB — FOLLICLE STIMULATING HORMONE: FSH: 78 m[IU]/mL

## 2019-03-26 ENCOUNTER — Telehealth: Payer: Self-pay | Admitting: Oncology

## 2019-03-26 LAB — ESTRADIOL, ULTRA SENS: Estradiol, Sensitive: <2.5 pg/mL

## 2019-03-26 NOTE — Telephone Encounter (Signed)
Cancelled appt per 4/23 sch message - pt is aware treatment will start 5/20

## 2019-03-29 ENCOUNTER — Encounter: Payer: Self-pay | Admitting: *Deleted

## 2019-04-02 ENCOUNTER — Telehealth (HOSPITAL_COMMUNITY): Payer: Self-pay | Admitting: Vascular Surgery

## 2019-04-02 NOTE — Telephone Encounter (Signed)
Left pt message giving echo appt @ [facility] 5/6 . Tele visit w/ DB 5/11 @ 3

## 2019-04-07 ENCOUNTER — Other Ambulatory Visit: Payer: Self-pay

## 2019-04-07 ENCOUNTER — Ambulatory Visit (HOSPITAL_COMMUNITY)
Admission: RE | Admit: 2019-04-07 | Discharge: 2019-04-07 | Disposition: A | Discharge status: Home / Self Care | Payer: BLUE CROSS/BLUE SHIELD | Source: Ambulatory Visit | Attending: Internal Medicine | Admitting: Internal Medicine

## 2019-04-07 DIAGNOSIS — I10 Essential (primary) hypertension: Secondary | ICD-10-CM | POA: Diagnosis not present

## 2019-04-07 DIAGNOSIS — C50411 Malignant neoplasm of upper-outer quadrant of right female breast: Secondary | ICD-10-CM | POA: Diagnosis not present

## 2019-04-07 NOTE — Progress Notes (Signed)
  Echocardiogram 2D Echocardiogram has been performed.  Darlina Sicilian M 04/07/2019, 11:38 AM

## 2019-04-08 ENCOUNTER — Other Ambulatory Visit: Payer: 59

## 2019-04-08 ENCOUNTER — Ambulatory Visit: Payer: 59

## 2019-04-12 ENCOUNTER — Other Ambulatory Visit: Payer: Self-pay

## 2019-04-12 ENCOUNTER — Ambulatory Visit (HOSPITAL_COMMUNITY)
Admission: RE | Admit: 2019-04-12 | Discharge: 2019-04-12 | Disposition: A | Discharge status: Home / Self Care | Payer: BLUE CROSS/BLUE SHIELD | Source: Ambulatory Visit | Attending: Internal Medicine | Admitting: Internal Medicine

## 2019-04-12 DIAGNOSIS — Z170 Estrogen receptor positive status [ER+]: Secondary | ICD-10-CM

## 2019-04-12 DIAGNOSIS — C50411 Malignant neoplasm of upper-outer quadrant of right female breast: Secondary | ICD-10-CM

## 2019-04-12 DIAGNOSIS — I10 Essential (primary) hypertension: Secondary | ICD-10-CM

## 2019-04-12 NOTE — Progress Notes (Signed)
Heart Failure TeleHealth Note  Due to national recommendations of social distancing due to [place] 19, Audio/video telehealth visit is felt to be most appropriate for this patient at this time.  See MyChart message from today for patient consent regarding telehealth for [name] [name].  Date:  04/12/2019   ID:  [name] [name], DOB 01-19-1971, MRN [id number]  Location: Home  Provider location: [facility] Type of Visit: Established patient  PCP:  [name] Stalker, PA-C  Cardiologist:  [name] Furbish, MD Primary HF: [name] [name]  Chief Complaint: Heart Failure follow-up   History of Present Illness:  HPI:  [name] [name] is 48 y.o. female with h/o HTN, TIA, anxiety and left breast cancer (treated in 2000 at [facility]) who was diagnosed with triple-positive R breast cancer in 4/19. Referred by [name] [name] for enrollment into the Cardio-Oncology program.  Cancer history/treatment plan:  (1) status post left lumpectomy in 2000 for a (?) T2N0 breast cancer, (a) status post adjuvant chemotherapy including adriamycin (b) status post adjuvant radiation (c) did not receive antiestrogens  (2) status post right breast biopsy 03/16/2018 for a clinical mT2-3 N2, anatomic stage III invasive ductal carcinoma, grade 2, triple positive, with an MIB-1 of 80%. (a) bone scan and chest CT scan negative for metastases except for a thoracic spine lytic lesion of concern  (3) neoadjuvant chemotherapy consisting of carboplatin, docetaxel, trastuzumab and Pertuzumab every 3 weeks x 6 starting 04/16/2018 (a) pertuzumab held beginning with cycle 2 because of diarrhea  (4) trastuzumab to be continued for a year  (5) 9/25 s/p surgical resection  (6) adjuvant radiation to the right breast completed   (7) antiestrogens to start at the completion of local treatment  She presents via audio/video conferencing for a telehealth  visit today. Since we last saw her she has been found to have metastatic disease. PET scan on 03/11/2019 showing: Hypermetabolic bony lytic lesions of the right upper sternal manubrium; the right T3 posterior elements; and of the T5 spinous process, compatible with bony malignancyDoing well. Will be on Herceptin life-long. Has gone to a plant-based diet. Her husband bought her a TM and she is walking on it regularly. SBP 120s on losartan and carvedilo     Echo 04/07/19 EF 60-65% GLS -17.7% (underestimated due to poor endocardial tracking.  Personally reviewed  Echo 12/19 EF 60% GLS -23%  Echo 06/29/18 EF 65-70%  GLS -20.6% LS 9.5 cm/s  Echo 4/19 EF 60-65% mild LVH Grade II DD     [name] [name] denies symptoms worrisome for COVID 19.   Past Medical History:  Diagnosis Date  . Anxiety   . Breast cancer, left breast ([facility]) 2000   Underwent lumpectomy, chemotherapy and radiation  . Facial paralysis/Bells palsy    left side  . Family history of lung cancer   . Family history of lymphoma   . Hypertension   . Neuropathy    IN FINGERS AND TOES   RECENT INFUSION OF CHEMO  . Personal history of breast cancer 05/06/2018  . Renal disorder    just after TIA acute kidney injury  . TIA (transient ischemic attack) 03/16/2017   [facility]   Past Surgical History:  Procedure Laterality Date  . BREAST LUMPECTOMY WITH AXILLARY LYMPH NODE BIOPSY Left 2000   biopsy  . BREAST SURGERY     partial mastectomy with lymph node removal  . MASTECTOMY MODIFIED RADICAL Right 08/26/2018   Procedure: MASTECTOMY MODIFIED RADICAL;  Surgeon: [name] Kussmaul, MD;  Location: [facility] OR;  Service: General;  Laterality: Right;  . MODIFIED MASTECTOMY Right 08/26/2018  . PORTACATH PLACEMENT Left 04/08/2018   Procedure: INSERTION PORT-A-CATH;  Surgeon: [name] Kussmaul, MD;  Location: [facility];  Service: General;  Laterality: Left;  . TUBAL LIGATION Bilateral 2004  . WISDOM TOOTH EXTRACTION       Current Outpatient  Medications  Medication Sig Dispense Refill  . anastrozole (ARIMIDEX) 1 MG tablet Take 1 tablet (1 mg total) by mouth daily. 90 tablet 4  . aspirin EC 81 MG tablet Take 81 mg by mouth daily.    . carvedilol (COREG) 3.125 MG tablet Take 1 tablet (3.125 mg total) by mouth 2 (two) times daily with a meal. 60 tablet 6  . Cyanocobalamin (VITAMIN B12) 500 MCG TABS Take 500 mcg by mouth daily.     . ferrous sulfate 325 (65 FE) MG tablet Take 1 tablet (325 mg total) by mouth daily. 30 tablet 0  . gabapentin (NEURONTIN) 300 MG capsule Take 1 capsule (300 mg total) by mouth at bedtime. 90 capsule 4  . lidocaine-prilocaine (EMLA) cream Apply 1 application topically as needed.    Marland Kitchen losartan (COZAAR) 50 MG tablet Take 1 tablet (50 mg total) by mouth daily. 30 tablet 11  . Multiple Vitamin (MULITIVITAMIN WITH MINERALS) TABS Take 1 tablet by mouth daily.      Marland Kitchen spironolactone (ALDACTONE) 50 MG tablet TAKE 1 TABLET (50 MG TOTAL) BY MOUTH DAILY. 30 tablet 6   No current facility-administered medications for this encounter.     Allergies:   Ace inhibitors; Lisinopril; Amlodipine; Adhesive [tape]; and Latex   Social History:  The patient  reports that she has never smoked. She has never used smokeless tobacco. She reports that she does not drink alcohol or use drugs.   Family History:  The patient's family history includes Alzheimer's disease in her maternal grandmother; CAD in her maternal grandmother and paternal grandmother; Diabetes in her father; Diverticulosis in her father; Hypertension in her father, maternal grandmother, mother, and paternal grandmother; Lung cancer (age of onset: 26) in her father; Lymphoma (age of onset: 19) in her paternal aunt; Pneumonia in her maternal aunt; Sickle cell trait in her paternal grandfather; Stroke in her paternal grandfather.   ROS:  Please see the history of present illness.   All other systems are personally reviewed and negative.   Exam:  (Video/Tele Health Call;  Exam is subjective and or/visual.) General:  Speaks in full sentences. No resp difficulty. Lungs: Normal respiratory effort with conversation.  Abdomen: Non-distended per patient report Extremities: Pt denies edema. Neuro: Alert & oriented x 3.   Recent Labs: 03/24/2019: ALT 30; BUN 22; Creatinine, Ser 1.33; Hemoglobin 11.1; Platelets 163; Potassium 3.7; Sodium 138  Personally reviewed   Wt Readings from Last 3 Encounters:  03/24/19 101.8 kg (224 lb 6.4 oz)  02/25/19 100.5 kg (221 lb 9.6 oz)  01/18/19 97.5 kg (215 lb)      ASSESSMENT AND PLAN:  1. Right Breast Cancer diagnosed 4/19 - triple positive. Found to have Stage IV. Disease in 4/20. - I reviewed echos personally. EF and Doppler parameters stable. No HF on exam. Continue lifelong Herceptin.   2. HTN - now on losartan and carvedilol.  - Blood pressure well controlled wit SBPs. Continue current regimen. - Goal SBP 110-130. Can drop off carvedilol and then losartan as she loses weight through her diet.     COVID screen The patient does not have any symptoms that  suggest any further testing/ screening at this time.  Social distancing reinforced today.  Recommended follow-up:  As above  Relevant cardiac medications were reviewed at length with the patient today.   The patient does not have concerns regarding their medications at this time.   The following changes were made today:  As above  Today, I have spent 13 minutes with the patient with telehealth technology discussing the above issues .    Signed, Glori Bickers, MD  04/12/2019 7:46 PM  [facility] [address] Heart and [place] [address] [phone] (office) [phone] (fax)

## 2019-04-13 ENCOUNTER — Telehealth (HOSPITAL_COMMUNITY): Payer: Self-pay

## 2019-04-13 NOTE — Patient Instructions (Signed)
Your physician has requested that you have an echocardiogram. Echocardiography is a painless test that uses sound waves to create images of your heart. It provides your doctor with information about the size and shape of your heart and how well your heart's chambers and valves are working. This procedure takes approximately one hour. There are no restrictions for this procedure. This will be done at your next appointment with Dr. Haroldine Laws in 3-4 months.  Please follow up with Dr. Haroldine Laws in 3-4 months with an echocardiogram.

## 2019-04-13 NOTE — Addendum Note (Signed)
Encounter addended by: Marlise Eves, RN on: 04/13/2019 9:01 AM  Actions taken: Order list changed, Diagnosis association updated, Clinical Note Signed

## 2019-04-13 NOTE — Telephone Encounter (Signed)
Called patient this morning advised Bystolic not approved by insurance.  Pt reports she had telehtealth call with Dr Haroldine Laws and patient was prescribed alternative medical management.  Bystolic discontinued. Pt appreciative of call.

## 2019-04-16 ENCOUNTER — Other Ambulatory Visit: Payer: Self-pay | Admitting: *Deleted

## 2019-04-16 ENCOUNTER — Telehealth: Payer: Self-pay | Admitting: *Deleted

## 2019-04-16 ENCOUNTER — Telehealth: Payer: Self-pay | Admitting: Radiation Oncology

## 2019-04-16 ENCOUNTER — Other Ambulatory Visit: Payer: Self-pay | Admitting: Oncology

## 2019-04-16 DIAGNOSIS — C50411 Malignant neoplasm of upper-outer quadrant of right female breast: Secondary | ICD-10-CM

## 2019-04-16 DIAGNOSIS — Z170 Estrogen receptor positive status [ER+]: Secondary | ICD-10-CM

## 2019-04-16 MED ORDER — TRAMADOL HCL 50 MG PO TABS: 50.0000 mg | ORAL_TABLET | Freq: Four times a day (QID) | ORAL | 0 refills | Status: DC | PRN

## 2019-04-16 NOTE — Telephone Encounter (Signed)
This RN spoke with pt per her call -   [place] states she is having ongoing pain in her right " breast bone area and the clavicle " ( noted positive area on PET scan ).  Pain is interfering with sleep and is more notable during the day.  She is not having any issues with movement or position of right arm.  [place] is using tylenol and ibuprofen with minimal benefit.  Of note she is reluctant to take pain medications due to opioid concern, and side effects that could interfere with being able to work ( she is working from home ).  Above discussed - with plan post MD review.  She will try localized lidocaine to area.  Tramadol will be sent to verified pharmacy.  Referral will be made to radiation for possible benefit.

## 2019-04-16 NOTE — Telephone Encounter (Signed)
New message:     LVM for patient to call back to schedule appt from referral received

## 2019-04-20 ENCOUNTER — Telehealth: Payer: Self-pay | Admitting: Radiation Oncology

## 2019-04-20 ENCOUNTER — Encounter (HOSPITAL_COMMUNITY): Payer: BLUE CROSS/BLUE SHIELD | Admitting: Internal Medicine

## 2019-04-20 NOTE — Telephone Encounter (Signed)
New Message:     LVM for patient to return call and schedule appt

## 2019-04-21 ENCOUNTER — Other Ambulatory Visit: Payer: Self-pay

## 2019-04-21 ENCOUNTER — Inpatient Hospital Stay: Payer: BC Managed Care – PPO

## 2019-04-21 ENCOUNTER — Inpatient Hospital Stay: Payer: BC Managed Care – PPO | Attending: Oncology

## 2019-04-21 VITALS — BP 135/88 | HR 79 | Temp 98.3°F | Resp 16

## 2019-04-21 DIAGNOSIS — C7951 Secondary malignant neoplasm of bone: Secondary | ICD-10-CM | POA: Diagnosis not present

## 2019-04-21 DIAGNOSIS — C50811 Malignant neoplasm of overlapping sites of right female breast: Secondary | ICD-10-CM | POA: Diagnosis not present

## 2019-04-21 DIAGNOSIS — Z5112 Encounter for antineoplastic immunotherapy: Secondary | ICD-10-CM | POA: Diagnosis not present

## 2019-04-21 DIAGNOSIS — C50411 Malignant neoplasm of upper-outer quadrant of right female breast: Secondary | ICD-10-CM | POA: Diagnosis not present

## 2019-04-21 DIAGNOSIS — Z170 Estrogen receptor positive status [ER+]: Secondary | ICD-10-CM

## 2019-04-21 LAB — CBC WITH DIFFERENTIAL/PLATELET
Abs Immature Granulocytes: 0.02 10*3/uL (ref 0.00–0.07)
Basophils Absolute: 0 10*3/uL (ref 0.0–0.1)
Basophils Relative: 1 %
Eosinophils Absolute: 0.1 10*3/uL (ref 0.0–0.5)
Eosinophils Relative: 2 %
HCT: 34.9 % — ABNORMAL LOW (ref 36.0–46.0)
Hemoglobin: 11.4 g/dL — ABNORMAL LOW (ref 12.0–15.0)
Immature Granulocytes: 1 %
Lymphocytes Relative: 24 %
Lymphs Abs: 1.1 10*3/uL (ref 0.7–4.0)
MCH: 27.5 pg (ref 26.0–34.0)
MCHC: 32.7 g/dL (ref 30.0–36.0)
MCV: 84.1 fL (ref 80.0–100.0)
Monocytes Absolute: 0.5 10*3/uL (ref 0.1–1.0)
Monocytes Relative: 10 %
Neutro Abs: 2.8 10*3/uL (ref 1.7–7.7)
Neutrophils Relative %: 62 %
Platelets: 174 10*3/uL (ref 150–400)
RBC: 4.15 MIL/uL (ref 3.87–5.11)
RDW: 14.6 % (ref 11.5–15.5)
WBC: 4.4 10*3/uL (ref 4.0–10.5)
nRBC: 0 % (ref 0.0–0.2)

## 2019-04-21 LAB — COMPREHENSIVE METABOLIC PANEL
ALT: 29 U/L (ref 0–44)
AST: 23 U/L (ref 15–41)
Albumin: 3.7 g/dL (ref 3.5–5.0)
Alkaline Phosphatase: 117 U/L (ref 38–126)
Anion gap: 9 (ref 5–15)
BUN: 28 mg/dL — ABNORMAL HIGH (ref 6–20)
CO2: 24 mmol/L (ref 22–32)
Calcium: 11.2 mg/dL — ABNORMAL HIGH (ref 8.9–10.3)
Chloride: 106 mmol/L (ref 98–111)
Creatinine, Ser: 1.28 mg/dL — ABNORMAL HIGH (ref 0.44–1.00)
GFR calc Af Amer: 58 mL/min — ABNORMAL LOW (ref >60)
GFR calc non Af Amer: 50 mL/min — ABNORMAL LOW (ref >60)
Glucose, Bld: 100 mg/dL — ABNORMAL HIGH (ref 70–99)
Potassium: 4.1 mmol/L (ref 3.5–5.1)
Sodium: 139 mmol/L (ref 135–145)
Total Bilirubin: 0.3 mg/dL (ref 0.3–1.2)
Total Protein: 7.5 g/dL (ref 6.5–8.1)

## 2019-04-21 MED ORDER — TRASTUZUMAB CHEMO 150 MG IV SOLR
600.0000 mg | Freq: Once | INTRAVENOUS | Status: AC
  Administered 2019-04-21: 600 mg via INTRAVENOUS
  Filled 2019-04-21: qty 28.57

## 2019-04-21 MED ORDER — SODIUM CHLORIDE 0.9 % IV SOLN
Freq: Once | INTRAVENOUS | Status: AC
  Administered 2019-04-21: 13:00:00 via INTRAVENOUS
  Filled 2019-04-21: qty 250

## 2019-04-21 MED ORDER — ACETAMINOPHEN 325 MG PO TABS
ORAL_TABLET | ORAL | Status: AC
  Filled 2019-04-21: qty 2

## 2019-04-21 MED ORDER — SODIUM CHLORIDE 0.9% FLUSH
10.0000 mL | INTRAVENOUS | Status: DC | PRN
  Administered 2019-04-21: 10 mL
  Filled 2019-04-21: qty 10

## 2019-04-21 MED ORDER — HEPARIN SOD (PORK) LOCK FLUSH 100 UNIT/ML IV SOLN
500.0000 [IU] | Freq: Once | INTRAVENOUS | Status: AC | PRN
  Administered 2019-04-21: 500 [IU]
  Filled 2019-04-21: qty 5

## 2019-04-21 MED ORDER — ACETAMINOPHEN 325 MG PO TABS
650.0000 mg | ORAL_TABLET | Freq: Once | ORAL | Status: AC
  Administered 2019-04-21: 650 mg via ORAL

## 2019-04-21 MED ORDER — DIPHENHYDRAMINE HCL 25 MG PO CAPS
ORAL_CAPSULE | ORAL | Status: AC
  Filled 2019-04-21: qty 1

## 2019-04-21 MED ORDER — DIPHENHYDRAMINE HCL 25 MG PO CAPS
25.0000 mg | ORAL_CAPSULE | Freq: Once | ORAL | Status: AC
  Administered 2019-04-21: 25 mg via ORAL

## 2019-04-21 NOTE — Progress Notes (Signed)
04/21/19  Confirmed Herceptin dose as 6 mg/kg every 4 weeks.  V.O. Dr Magrinat/Valerie Nicola Girt RN/Jaquel Glassburn Ronnald Ramp, PharmD

## 2019-04-21 NOTE — Progress Notes (Signed)
Histology and Location of Primary Cancer: Metastatic breast cancer to bone.   Location(s) of Symptomatic Metastases: Right shoulder, right breast bone.  PET 01/07/6388: Hypermetabolic bony lytic lesions of the right upper sternal manubrium.  The right T3 posterior elements; and of the T5 spinous process, compatible with bony malignancy.  Postoperative and post radiation therapy findings in the right chest wall and right anterior lung.  CT neck/CTA Chest 02/04/2019: T6 lytic lesion (noted on May 2019 scan) is now sclerotic; a sclerotic T3 lesion is stable; there is a new lytic lesion in the manubrium.  Past/Anticipated chemotherapy by medical oncology, if any:  Dr. Jana Hakim 03/24/2019 -[place] currently has no symptoms related to her cancer.  She understands that she does have stage IV disease, although we have not yet been able to confirm this pathologically.  The sternal biopsy likely will not be done until June given the current pandemic.  The good news from the PET scan is that she has no visceral disease. -While we could add a CPK inhibitor at this point, I would prefer to wait and continue the anastrozole and trastuzumab alone and see if this allows for good tumor control.  We can always add more agents later. -Referral made to radiation for possible benefit of palliative radiation to right breast bone area and clavicle.   Pain on a scale of 0-10 is: right shoulder and under right breast at the breast bone.  Taking tramadol on occasion.   Ambulatory status? Walker? Wheelchair?: Ambulatory  SAFETY ISSUES:  Prior radiation? Adjuvant radiation to left breast in 2000. R CW 09/30/2018-11/16/2018.  Pacemaker/ICD? No  Possible current pregnancy? Perimenopausal  Is the patient on methotrexate? No  Current Complaints / other details:

## 2019-04-21 NOTE — Patient Instructions (Addendum)
[facility] Discharge Instructions for Patients Receiving Chemotherapy  Today you received the following chemotherapy agents Herceptin  To help prevent nausea and vomiting after your treatment, we encourage you to take your nausea medication as directed   If you develop nausea and vomiting that is not controlled by your nausea medication, call the clinic.   BELOW ARE SYMPTOMS THAT SHOULD BE REPORTED IMMEDIATELY:  *FEVER GREATER THAN 100.5 F  *CHILLS WITH OR WITHOUT FEVER  NAUSEA AND VOMITING THAT IS NOT CONTROLLED WITH YOUR NAUSEA MEDICATION  *UNUSUAL SHORTNESS OF BREATH  *UNUSUAL BRUISING OR BLEEDING  TENDERNESS IN MOUTH AND THROAT WITH OR WITHOUT PRESENCE OF ULCERS  *URINARY PROBLEMS  *BOWEL PROBLEMS  UNUSUAL RASH Items with * indicate a potential emergency and should be followed up as soon as possible.  Feel free to call the clinic should you have any questions or concerns. The clinic phone number is [phone].  Please show the CHEMO ALERT CARD at check-in to the Emergency Department and triage nurse.   

## 2019-04-22 ENCOUNTER — Other Ambulatory Visit: Payer: Self-pay

## 2019-04-22 ENCOUNTER — Encounter: Payer: Self-pay | Admitting: Radiation Oncology

## 2019-04-22 ENCOUNTER — Other Ambulatory Visit: Payer: Self-pay | Admitting: Radiation Therapy

## 2019-04-22 ENCOUNTER — Ambulatory Visit
Admission: RE | Admit: 2019-04-22 | Discharge: 2019-04-22 | Disposition: A | Discharge status: Home / Self Care | Payer: BLUE CROSS/BLUE SHIELD | Source: Ambulatory Visit | Attending: Radiation Oncology | Admitting: Radiation Oncology

## 2019-04-22 VITALS — Ht 59.0 in | Wt 213.0 lb

## 2019-04-22 DIAGNOSIS — C7951 Secondary malignant neoplasm of bone: Secondary | ICD-10-CM

## 2019-04-22 DIAGNOSIS — C50411 Malignant neoplasm of upper-outer quadrant of right female breast: Secondary | ICD-10-CM

## 2019-04-22 DIAGNOSIS — Z170 Estrogen receptor positive status [ER+]: Secondary | ICD-10-CM | POA: Diagnosis not present

## 2019-04-22 DIAGNOSIS — Z08 Encounter for follow-up examination after completed treatment for malignant neoplasm: Secondary | ICD-10-CM | POA: Diagnosis not present

## 2019-04-22 NOTE — Progress Notes (Signed)
Radiation Oncology         [phone]) [phone] ________________________________  Name: [name] [name]        MRN: [id number]  Date of Service: 04/22/2019 DOB: 1970/12/17  ZD:GLOVFIE, Loma Sousa, PA-C  Magrinat, Virgie Dad, MD     REFERRING PHYSICIAN: Magrinat, Virgie Dad, MD   DIAGNOSIS: The primary encounter diagnosis was Bone metastasis ([place]). Diagnoses of Malignant neoplasm of upper-outer quadrant of right breast in female, estrogen receptor positive ([place]) and Bone metastases ([place]) were also pertinent to this visit.   HISTORY OF PRESENT ILLNESS: [name] Enck is a 48 y.o. female originally seen in the multidisciplinary breast clinic for a new diagnosis of right breast cancer. The patient was noted to have a prior diagnosis of left breast cancer in 2000. She completed lumpectomy with adjuvant chemotherapy and radiation. She has been followed since, and noted a palpable area of fullness in the right breast in the upper outer quadrant. She underwent diagnostic imaging which revealed a  4.6 cm mass at 10:30 in the right breast, and a 1.2 cm mass at 12:00. She had greater than 6 lymph nodes that appeared abnormal in the right axilla. She underwent biopsy of all three sites on 03/16/18 revealing a grade 2, invasive ductal carcinoma, triple positive, with a Ki 67 of 80%. She completed chemotherapy between 04/16/18- 07/30/18. 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 Her 2 of 5 sampled axillary nodes were positive. Margins were negative but LVSI was noted. She continued with Herceptin and began adjuvant radiotherapy to the chest wall and regional nodes. Her course was completed on 11/16/18. She recently noticed pain in the right clavicular area and a PET on 03/11/2019 noted disease in the upper right sternum as well as hypermetabolism in the T3 and T5 spine. She is seen via Webex today to discuss treatment options.    PREVIOUS RADIATION THERAPY: Yes   09/30/2018 - 11/16/2018: The patient initially received a dose of 50.4 Gy in 28 fractions to the right chest wall and supraclavicular region. This was delivered using a 3-D conformal, 4 field technique. The patient then received a boost to the mastectomy scar. This delivered an additional 10 Gy in 5 fractions using an en face electron field. The total dose was 60.4 Gy.  2000: external beam radiotherapy to the left breast with Dr. Lucia Gaskins at [facility] in [place].    PAST MEDICAL HISTORY:  Past Medical History:  Diagnosis Date  . Anxiety   . Breast cancer, left breast ([place]) 2000   Underwent lumpectomy, chemotherapy and radiation  . Facial paralysis/Bells palsy    left side  . Family history of lung cancer   . Family history of lymphoma   . Hypertension   . Neuropathy    IN FINGERS AND TOES   RECENT INFUSION OF CHEMO  . Personal history of breast cancer 05/06/2018  . Renal disorder    just after TIA acute kidney injury  . TIA (transient ischemic attack) 03/16/2017   [place] [facility]       PAST SURGICAL HISTORY: Past Surgical History:  Procedure Laterality Date  . BREAST LUMPECTOMY WITH AXILLARY LYMPH NODE BIOPSY Left 2000   biopsy  . BREAST SURGERY     partial mastectomy with lymph node removal  . MASTECTOMY MODIFIED RADICAL Right 08/26/2018   Procedure: MASTECTOMY MODIFIED RADICAL;  Surgeon: Jovita Kussmaul, MD;  Location: [place];  Service: General;  Laterality: Right;  .  MODIFIED MASTECTOMY Right 08/26/2018  . PORTACATH PLACEMENT Left 04/08/2018   Procedure: INSERTION PORT-A-CATH;  Surgeon: Jovita Kussmaul, MD;  Location: [place];  Service: General;  Laterality: Left;  . TUBAL LIGATION Bilateral 2004  . WISDOM TOOTH EXTRACTION       FAMILY HISTORY:  Family History  Problem Relation Age of Onset  . Hypertension Mother   . Hypertension Father   . Diverticulosis Father   . Diabetes Father   . Lung cancer Father 70       hx smoking  .  Hypertension Maternal Grandmother   . CAD Maternal Grandmother   . Alzheimer's disease Maternal Grandmother        died at 3  . Hypertension Paternal Grandmother   . CAD Paternal Grandmother   . Stroke Paternal Grandfather   . Sickle cell trait Paternal Grandfather   . Pneumonia Maternal Aunt        died at a young adult  . Lymphoma Paternal Aunt 74     SOCIAL HISTORY:  reports that she has never smoked. She has never used smokeless tobacco. She reports that she does not drink alcohol or use drugs. The patient is married and lives in [place]. She works for a [employer].   ALLERGIES: Ace inhibitors; Lisinopril; Amlodipine; Adhesive [tape]; and Latex   MEDICATIONS:  Current Outpatient Medications  Medication Sig Dispense Refill  . anastrozole (ARIMIDEX) 1 MG tablet Take 1 tablet (1 mg total) by mouth daily. 90 tablet 4  . aspirin EC 81 MG tablet Take 81 mg by mouth daily.    . carvedilol (COREG) 3.125 MG tablet Take 1 tablet (3.125 mg total) by mouth 2 (two) times daily with a meal. 60 tablet 6  . Cyanocobalamin (VITAMIN B12) 500 MCG TABS Take 500 mcg by mouth daily.     . ferrous sulfate 325 (65 FE) MG tablet Take 1 tablet (325 mg total) by mouth daily. 30 tablet 0  . gabapentin (NEURONTIN) 300 MG capsule Take 1 capsule (300 mg total) by mouth at bedtime. 90 capsule 4  . lidocaine-prilocaine (EMLA) cream Apply 1 application topically as needed.    Marland Kitchen losartan (COZAAR) 50 MG tablet Take 1 tablet (50 mg total) by mouth daily. 30 tablet 11  . Multiple Vitamin (MULITIVITAMIN WITH MINERALS) TABS Take 1 tablet by mouth daily.      Marland Kitchen spironolactone (ALDACTONE) 50 MG tablet TAKE 1 TABLET (50 MG TOTAL) BY MOUTH DAILY. 30 tablet 6  . traMADol (ULTRAM) 50 MG tablet Take 1 tablet (50 mg total) by mouth every 6 (six) hours as needed. 80 tablet 0   No current facility-administered medications for this encounter.      REVIEW OF SYSTEMS:On review of systems, the  patient reports that she is doing well overall. She reports her pain is in the upper part of the clavicular area and that it does not radiate. She denies any back pain but notes some mid chest wall pain on the right in the prior field of her recent radiotherapy. She describes these areas of pain as being a burning sensation. She denies any chest tightness, shortness of breath, cough, fevers, chills, night sweats, unintended weight changes. She denies any bowel or bladder disturbances, and denies abdominal pain, nausea or vomiting. She denies any other musculoskeletal or joint aches or pains, new skin lesions or concerns. A complete review of systems is obtained and is otherwise negative.      PHYSICAL EXAM:  Vitals unable  to be collected due to nature of visit.  In general this is a well appearing African American female in no acute distress. She's alert and oriented x4 and appropriate throughout the examination. Cardiopulmonary assessment is negative for acute distress and she exhibits normal effort. She points to her right medial clavicle as the site of the pain that she describes as being in the shoulder area, but not extending toward the [facility] joint or humerus.     ECOG = 1  0 - Asymptomatic (Fully active, able to carry on all predisease activities without restriction)  1 - Symptomatic but completely ambulatory (Restricted in physically strenuous activity but ambulatory and able to carry out work of a light or sedentary nature. For example, light housework, office work)  2 - Symptomatic, <50% in bed during the day (Ambulatory and capable of all self care but unable to carry out any work activities. Up and about more than 50% of waking hours)  3 - Symptomatic, >50% in bed, but not bedbound (Capable of only limited self-care, confined to bed or chair 50% or more of waking hours)  4 - Bedbound (Completely disabled. Cannot carry on any self-care. Totally confined to bed or chair)  5 - Death    Eustace Pen MM, Creech RH, Tormey DC, et al. [phone]). "Toxicity and response criteria of the [facility] Group". [place] Oncol. 5 (6): 649-55    LABORATORY DATA:  Lab Results  Component Value Date   WBC 4.4 04/21/2019   HGB 11.4 (L) 04/21/2019   HCT 34.9 (L) 04/21/2019   MCV 84.1 04/21/2019   PLT 174 04/21/2019   Lab Results  Component Value Date   NA 139 04/21/2019   K 4.1 04/21/2019   CL 106 04/21/2019   CO2 24 04/21/2019   Lab Results  Component Value Date   ALT 29 04/21/2019   AST 23 04/21/2019   ALKPHOS 117 04/21/2019   BILITOT 0.3 04/21/2019      RADIOGRAPHY: No results found.     IMPRESSION/PLAN: 1. Recurrent Metastatic Stage IIA, cT2N2M0 grade 2, Triple positive invasive ductal carcinoma of the right breast. Dr. Lisbeth Renshaw discusses the imaging findings thusfar with her recent PET scan. He would like to offer a palliative course of treatment to the sternal area but would need to use her simulation films to merge with her prior treatment plan to compare dose distribution. He discusses the risks of reirradiation as it can increase toxicities. He also would recommend an MRI of the T spine. We will order this as she may be a candidate for [facility] style treatment to the spine, with palliative treatment to the sternum. We discussed the risks, benefits, short, and long term effects of radiotherapy, and the patient is interested in proceeding. Verbal consent was given to proceed and she will come for simulation on 05/06/2019. She will continue with plans for treatment with Dr. Jana Hakim as well.     This encounter was provided by telemedicine platform Webex.  The patient has given verbal consent for this type of encounter and has been advised to only accept a meeting of this type in a secure network environment. The time spent during this encounter was 45 minutes. The attendants for this meeting include Blenda Nicely, RN, Dr. Lisbeth Renshaw, Hayden Pedro  and [name] Donnellan.   During the encounter,  Blenda Nicely, RN, Dr. Lisbeth Renshaw, and Hayden Pedro were located at [facility]iation Oncology Department.  [name] Wendt was located at home.  The above documentation reflects my direct findings during this shared patient visit. Please see the separate note by Dr. Lisbeth Renshaw on this date for the remainder of the patient's plan of care.    Carola Rhine, PAC

## 2019-04-23 ENCOUNTER — Encounter: Payer: Self-pay | Admitting: Oncology

## 2019-04-23 ENCOUNTER — Other Ambulatory Visit: Payer: Self-pay | Admitting: Radiation Therapy

## 2019-04-23 DIAGNOSIS — C7951 Secondary malignant neoplasm of bone: Secondary | ICD-10-CM

## 2019-04-29 ENCOUNTER — Ambulatory Visit: Payer: BLUE CROSS/BLUE SHIELD | Attending: General Surgery | Admitting: Rehabilitation

## 2019-05-03 ENCOUNTER — Ambulatory Visit (HOSPITAL_COMMUNITY)
Admission: RE | Admit: 2019-05-03 | Discharge: 2019-05-03 | Disposition: A | Discharge status: Home / Self Care | Payer: BC Managed Care – PPO | Source: Ambulatory Visit | Attending: Radiation Oncology | Admitting: Radiation Oncology

## 2019-05-03 ENCOUNTER — Other Ambulatory Visit: Payer: Self-pay

## 2019-05-03 DIAGNOSIS — C50919 Malignant neoplasm of unspecified site of unspecified female breast: Secondary | ICD-10-CM | POA: Diagnosis not present

## 2019-05-03 DIAGNOSIS — C7951 Secondary malignant neoplasm of bone: Secondary | ICD-10-CM

## 2019-05-03 MED ORDER — GADOBUTROL 1 MMOL/ML IV SOLN
10.0000 mL | Freq: Once | INTRAVENOUS | Status: AC | PRN
  Administered 2019-05-03: 10 mL via INTRAVENOUS

## 2019-05-03 MED ORDER — HEPARIN SOD (PORK) LOCK FLUSH 100 UNIT/ML IV SOLN
500.0000 [IU] | INTRAVENOUS | Status: AC | PRN
  Administered 2019-05-03: 500 [IU]

## 2019-05-06 ENCOUNTER — Other Ambulatory Visit: Payer: Self-pay

## 2019-05-06 ENCOUNTER — Ambulatory Visit
Admission: RE | Admit: 2019-05-06 | Discharge: 2019-05-06 | Disposition: A | Discharge status: Home / Self Care | Payer: BC Managed Care – PPO | Source: Ambulatory Visit | Attending: Radiation Oncology | Admitting: Radiation Oncology

## 2019-05-06 DIAGNOSIS — Z510 Encounter for antineoplastic radiation therapy: Secondary | ICD-10-CM | POA: Diagnosis not present

## 2019-05-06 DIAGNOSIS — C7951 Secondary malignant neoplasm of bone: Secondary | ICD-10-CM | POA: Diagnosis not present

## 2019-05-06 DIAGNOSIS — Z170 Estrogen receptor positive status [ER+]: Secondary | ICD-10-CM | POA: Insufficient documentation

## 2019-05-06 DIAGNOSIS — C50411 Malignant neoplasm of upper-outer quadrant of right female breast: Secondary | ICD-10-CM | POA: Insufficient documentation

## 2019-05-10 ENCOUNTER — Inpatient Hospital Stay: Payer: BC Managed Care – PPO | Attending: Oncology

## 2019-05-10 DIAGNOSIS — G629 Polyneuropathy, unspecified: Secondary | ICD-10-CM | POA: Insufficient documentation

## 2019-05-10 DIAGNOSIS — Z5112 Encounter for antineoplastic immunotherapy: Secondary | ICD-10-CM | POA: Insufficient documentation

## 2019-05-10 DIAGNOSIS — Z79899 Other long term (current) drug therapy: Secondary | ICD-10-CM | POA: Insufficient documentation

## 2019-05-10 DIAGNOSIS — Z170 Estrogen receptor positive status [ER+]: Secondary | ICD-10-CM | POA: Insufficient documentation

## 2019-05-10 DIAGNOSIS — C50811 Malignant neoplasm of overlapping sites of right female breast: Secondary | ICD-10-CM | POA: Insufficient documentation

## 2019-05-10 DIAGNOSIS — Z79811 Long term (current) use of aromatase inhibitors: Secondary | ICD-10-CM | POA: Insufficient documentation

## 2019-05-10 DIAGNOSIS — C7951 Secondary malignant neoplasm of bone: Secondary | ICD-10-CM | POA: Insufficient documentation

## 2019-05-10 DIAGNOSIS — Z8673 Personal history of transient ischemic attack (TIA), and cerebral infarction without residual deficits: Secondary | ICD-10-CM | POA: Insufficient documentation

## 2019-05-10 DIAGNOSIS — C50411 Malignant neoplasm of upper-outer quadrant of right female breast: Secondary | ICD-10-CM | POA: Insufficient documentation

## 2019-05-10 DIAGNOSIS — I10 Essential (primary) hypertension: Secondary | ICD-10-CM | POA: Insufficient documentation

## 2019-05-12 DIAGNOSIS — C7951 Secondary malignant neoplasm of bone: Secondary | ICD-10-CM | POA: Diagnosis not present

## 2019-05-12 DIAGNOSIS — Z510 Encounter for antineoplastic radiation therapy: Secondary | ICD-10-CM | POA: Diagnosis not present

## 2019-05-12 DIAGNOSIS — Z170 Estrogen receptor positive status [ER+]: Secondary | ICD-10-CM | POA: Diagnosis not present

## 2019-05-12 DIAGNOSIS — C50411 Malignant neoplasm of upper-outer quadrant of right female breast: Secondary | ICD-10-CM | POA: Diagnosis not present

## 2019-05-13 ENCOUNTER — Ambulatory Visit
Admission: RE | Admit: 2019-05-13 | Discharge: 2019-05-13 | Disposition: A | Discharge status: Home / Self Care | Payer: BC Managed Care – PPO | Source: Ambulatory Visit | Attending: Radiation Oncology | Admitting: Radiation Oncology

## 2019-05-13 ENCOUNTER — Other Ambulatory Visit: Payer: Self-pay

## 2019-05-13 DIAGNOSIS — C50411 Malignant neoplasm of upper-outer quadrant of right female breast: Secondary | ICD-10-CM | POA: Diagnosis not present

## 2019-05-13 DIAGNOSIS — C7951 Secondary malignant neoplasm of bone: Secondary | ICD-10-CM | POA: Diagnosis not present

## 2019-05-13 DIAGNOSIS — Z170 Estrogen receptor positive status [ER+]: Secondary | ICD-10-CM | POA: Diagnosis not present

## 2019-05-13 DIAGNOSIS — Z510 Encounter for antineoplastic radiation therapy: Secondary | ICD-10-CM | POA: Diagnosis not present

## 2019-05-14 ENCOUNTER — Other Ambulatory Visit: Payer: Self-pay

## 2019-05-14 ENCOUNTER — Ambulatory Visit
Admission: RE | Admit: 2019-05-14 | Discharge: 2019-05-14 | Disposition: A | Discharge status: Home / Self Care | Payer: BC Managed Care – PPO | Source: Ambulatory Visit | Attending: Radiation Oncology | Admitting: Radiation Oncology

## 2019-05-14 DIAGNOSIS — Z510 Encounter for antineoplastic radiation therapy: Secondary | ICD-10-CM | POA: Diagnosis not present

## 2019-05-14 DIAGNOSIS — C50411 Malignant neoplasm of upper-outer quadrant of right female breast: Secondary | ICD-10-CM | POA: Diagnosis not present

## 2019-05-14 DIAGNOSIS — Z170 Estrogen receptor positive status [ER+]: Secondary | ICD-10-CM | POA: Diagnosis not present

## 2019-05-14 DIAGNOSIS — C7951 Secondary malignant neoplasm of bone: Secondary | ICD-10-CM | POA: Diagnosis not present

## 2019-05-17 ENCOUNTER — Other Ambulatory Visit: Payer: Self-pay

## 2019-05-17 ENCOUNTER — Ambulatory Visit
Admission: RE | Admit: 2019-05-17 | Discharge: 2019-05-17 | Disposition: A | Discharge status: Home / Self Care | Payer: BC Managed Care – PPO | Source: Ambulatory Visit | Attending: Radiation Oncology | Admitting: Radiation Oncology

## 2019-05-17 DIAGNOSIS — C7951 Secondary malignant neoplasm of bone: Secondary | ICD-10-CM | POA: Diagnosis not present

## 2019-05-17 DIAGNOSIS — Z170 Estrogen receptor positive status [ER+]: Secondary | ICD-10-CM | POA: Diagnosis not present

## 2019-05-17 DIAGNOSIS — C50411 Malignant neoplasm of upper-outer quadrant of right female breast: Secondary | ICD-10-CM | POA: Diagnosis not present

## 2019-05-17 DIAGNOSIS — Z510 Encounter for antineoplastic radiation therapy: Secondary | ICD-10-CM | POA: Diagnosis not present

## 2019-05-18 ENCOUNTER — Ambulatory Visit
Admission: RE | Admit: 2019-05-18 | Discharge: 2019-05-18 | Disposition: A | Discharge status: Home / Self Care | Payer: BC Managed Care – PPO | Source: Ambulatory Visit | Attending: Radiation Oncology | Admitting: Radiation Oncology

## 2019-05-18 ENCOUNTER — Other Ambulatory Visit: Payer: Self-pay

## 2019-05-18 DIAGNOSIS — Z170 Estrogen receptor positive status [ER+]: Secondary | ICD-10-CM | POA: Diagnosis not present

## 2019-05-18 DIAGNOSIS — C50411 Malignant neoplasm of upper-outer quadrant of right female breast: Secondary | ICD-10-CM | POA: Diagnosis not present

## 2019-05-18 DIAGNOSIS — Z510 Encounter for antineoplastic radiation therapy: Secondary | ICD-10-CM | POA: Diagnosis not present

## 2019-05-18 DIAGNOSIS — C7951 Secondary malignant neoplasm of bone: Secondary | ICD-10-CM | POA: Diagnosis not present

## 2019-05-18 NOTE — Progress Notes (Signed)
[place]  [phone] [phone]    ID: [name] [name] DOB: 1971-03-23  MR#: [id number]  HBZ#:[id number]  Patient Care Team: Marda Stalker, PA-C as PCP - General (Family Medicine) Jerline Pain, MD as PCP - Cardiology (Cardiology) Armeda Plumb, Virgie Dad, MD as Consulting Physician (Oncology) Loreta Ave, MD as Referring Physician (Internal Medicine) Donato Heinz, MD as Consulting Physician (Nephrology) Kyung Rudd, MD as Consulting Physician (Radiation Oncology) Jovita Kussmaul, MD as Consulting Physician (General Surgery) Larey Dresser, MD as Consulting Physician (Cardiology) Mauro Kaufmann, RN as Oncology Nurse Navigator Rockwell Germany, RN as Oncology Nurse Navigator OTHER MD:   CHIEF COMPLAINT: Triple positive breast cancer  CURRENT TREATMENT: Trastuzumab; anastrozole; palliative radiation   HISTORY OF CURRENT ILLNESS: From the original intake note:  [name] Hussar palpated a lump on 01/15/2018. She felt soreness and shooting pain under her arm and in the right breast. She underwent bilateral diagnostic mammography with tomography and right breast ultrasonography at [facility] on 03/12/2018 showing: breast density category C. The is architectural distortion at the 11 o'clock position. There is also an oval mass in the right breast anterior depth. Examination of the right axilla showed enlarged lymph nodes. Ultrasonography showed a 4.6 cm mass in the right breast upper outer quadrant posterior depth. An additional 1.2 cm oval mass in the right breast 12 o'clock middle depth. The lymph nodes in the right axillary are highly suggestive of malignancy.   Accordingly on 03/16/2018 she proceeded to biopsy of the right breast area in question. The pathology from this procedure showed ([id number]): At both the 11 and 12 o'clock positions: Invasive ductal carcinoma grade II. Ductal carcinoma in situ, high grade, with necrosis and calcifications.  Prognostic indicators significant for: estrogen receptor, 90% positive and progesterone receptor, 40% positive, both with strong staining intensity. Proliferation marker Ki67 at 80%. HER2 amplified with ratios HER2/CEP17 SIGNALS 6.90 and average HER2 copies per cell 14.50  The patient's subsequent history is as detailed below.   INTERVAL HISTORY: [name] was seen today for follow-up and treatment of her triple positive breast cancer.  She continues on trastuzumab, which she receives every 28 days. Today is day 1 cycle 13.  Tolerates this with no side effects that she is aware of  Adellyn's last echocardiogram on 04/07/2019, showed an ejection fraction in the 55% - 60% range.  This is not a significant change and we are proceeding with treatment  She also continues on anastrozole.  She tolerates this with no side effects that she is aware of, in particular hot flashes and vaginal dryness are not an issue  Since her last visit here, she was evaluated by radiation oncology and neurosurgery and is undergoing SRS treatments to T3 and T5 and palliative treatment to her sternum.    She underwent a thoracic MRI with and without contrast on 05/03/2019 showing:  Metastatic disease affecting the right pedicle, transverse process and lamina at T3, with spinous process involvement. Early right-sided epidural tumor but without compressive effect at this time. Focal metastasis to the tip of the spinous process at T5. Metastasis to the right side of the sternum, primarily or evaluated.   REVIEW OF SYSTEMS: [name] is working from home.  She feels fine and has no symptoms related to her cancer or its treatment at this point.  Specifically denies unusual headaches visual changes nausea vomiting cough phlegm production pleurisy shortness of breath change in bowel or bladder habits fever rash bleeding unexplained weight loss or unexplained  fatigue.  A detailed review of systems was otherwise stable   PAST MEDICAL  HISTORY: Past Medical History:  Diagnosis Date  . Anxiety   . Breast cancer, left breast ([place]) 2000   Underwent lumpectomy, chemotherapy and radiation  . Facial paralysis/Bells palsy    left side  . Family history of lung cancer   . Family history of lymphoma   . Hypertension   . Neuropathy    IN FINGERS AND TOES   RECENT INFUSION OF CHEMO  . Personal history of breast cancer 05/06/2018  . Renal disorder    just after TIA acute kidney injury  . TIA (transient ischemic attack) 03/16/2017   [facility]   The patient has a previous history of left breast cancer diagnosed in 2000. She was seen by [facility] in [place], [place] under Dr. Loreta Ave. According to the patient, her left breast cancer was stage II with no lymph node involvement (likely T2N0). She had chemotherapy every 3 weeks for about 6 months. She did not takes any "red drugs." She also did not take anti-estrogens (likely ER/PR negative).   TIA in 2018. She saw a neurologist as a precaution. She denies any clotting issues.    PAST SURGICAL HISTORY: Past Surgical History:  Procedure Laterality Date  . BREAST LUMPECTOMY WITH AXILLARY LYMPH NODE BIOPSY Left 2000   biopsy  . BREAST SURGERY     partial mastectomy with lymph node removal  . MASTECTOMY MODIFIED RADICAL Right 08/26/2018   Procedure: MASTECTOMY MODIFIED RADICAL;  Surgeon: Jovita Kussmaul, MD;  Location: [place];  Service: General;  Laterality: Right;  . MODIFIED MASTECTOMY Right 08/26/2018  . PORTACATH PLACEMENT Left 04/08/2018   Procedure: INSERTION PORT-A-CATH;  Surgeon: Jovita Kussmaul, MD;  Location: [place];  Service: General;  Laterality: Left;  . TUBAL LIGATION Bilateral 2004  . WISDOM TOOTH EXTRACTION      FAMILY HISTORY Family History  Problem Relation Age of Onset  . Hypertension Mother   . Hypertension Father   . Diverticulosis Father   . Diabetes Father   . Lung cancer Father 61       hx smoking  . Hypertension Maternal  Grandmother   . CAD Maternal Grandmother   . Alzheimer's disease Maternal Grandmother        died at 5  . Hypertension Paternal Grandmother   . CAD Paternal Grandmother   . Stroke Paternal Grandfather   . Sickle cell trait Paternal Grandfather   . Pneumonia Maternal Aunt        died at a young adult  . Lymphoma Paternal Aunt 66   The patient's father is alive at age 22 and had a history of lung cancer. The patient's mother is alive at age 3. The patient has 1 brother and no sisters. She denies a family history of breast or ovarian cancer.     GYNECOLOGIC HISTORY:  No LMP recorded. (Menstrual status: Perimenopausal). Menarche: 48 years old Age at first live birth: 48 years old She is GXP3. She is no longer having periods, which were irregular and light. Her LMP was  January 2019. She is not having hot flashes. The patient used oral contraceptive from [phone] and the Depo-provera shot from [phone] with no complications. She never used HRT.    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 Her husband, [name], is a Administrator. The patient's son, Shea Stakes age 48, works at [employer] in [place]. The patient's daughters, Lilia Pro  age 7, and Levonne Spiller age 78, are students. The patient's adopted son, Vonna Kotyk age 81, is also a Ship broker.    ADVANCED DIRECTIVES:    HEALTH MAINTENANCE: Social History   Tobacco Use  . Smoking status: Never Smoker  . Smokeless tobacco: Never Used  Substance Use Topics  . Alcohol use: No  . Drug use: No     Colonoscopy: n/a  PAP: 2015  Bone density: never done   Allergies  Allergen Reactions  . Ace Inhibitors Swelling    Swelling of lips and face  . Lisinopril Swelling    Swelling of lips, face  . Amlodipine Swelling    Leg swelling  . Adhesive [Tape] Itching and Rash    Please use paper tape  . Latex Itching and Rash    Current Outpatient Medications  Medication Sig Dispense Refill  . anastrozole (ARIMIDEX) 1 MG  tablet Take 1 tablet (1 mg total) by mouth daily. 90 tablet 4  . aspirin EC 81 MG tablet Take 81 mg by mouth daily.    . carvedilol (COREG) 3.125 MG tablet Take 1 tablet (3.125 mg total) by mouth 2 (two) times daily with a meal. 60 tablet 6  . Cyanocobalamin (VITAMIN B12) 500 MCG TABS Take 500 mcg by mouth daily.     . ferrous sulfate 325 (65 FE) MG tablet Take 1 tablet (325 mg total) by mouth daily. 30 tablet 0  . gabapentin (NEURONTIN) 300 MG capsule Take 1 capsule (300 mg total) by mouth at bedtime. 90 capsule 4  . lidocaine-prilocaine (EMLA) cream Apply 1 application topically as needed.    Marland Kitchen losartan (COZAAR) 50 MG tablet Take 1 tablet (50 mg total) by mouth daily. 30 tablet 11  . Multiple Vitamin (MULITIVITAMIN WITH MINERALS) TABS Take 1 tablet by mouth daily.      Marland Kitchen spironolactone (ALDACTONE) 50 MG tablet TAKE 1 TABLET (50 MG TOTAL) BY MOUTH DAILY. 30 tablet 6  . traMADol (ULTRAM) 50 MG tablet Take 1 tablet (50 mg total) by mouth every 6 (six) hours as needed. 80 tablet 0   No current facility-administered medications for this visit.     OBJECTIVE: Middle-aged African-American woman in no acute distress  Vitals:   05/19/19 0858  BP: 130/67  Pulse: 83  Resp: 18  Temp: 98 F (36.7 C)  SpO2: 100%     Body mass index is 45.42 kg/m.   Wt Readings from Last 3 Encounters:  05/19/19 224 lb 14.4 oz (102 kg)  04/22/19 213 lb (96.6 kg)  03/24/19 224 lb 6.4 oz (101.8 kg)  ECOG FS:1 - Symptomatic but completely ambulatory  Sclerae unicteric, pupils round and equal No cervical or supraclavicular adenopathy Lungs no rales or rhonchi Heart regular rate and rhythm Abd soft, nontender, positive bowel sounds MSK no focal spinal tenderness including palpation over the upper thoracic spine, no upper extremity lymphedema Neuro: nonfocal, well oriented, appropriate affect Breasts: The right breast has undergone mastectomy and radiation.  There is hyperpigmentation.  There is no evidence of  local recurrence.  The left breast is unremarkable.  Both axillae are benign   LAB RESULTS:  CMP     Component Value Date/Time   NA 141 05/19/2019 0832   K 4.2 05/19/2019 0832   CL 108 05/19/2019 0832   CO2 24 05/19/2019 0832   GLUCOSE 101 (H) 05/19/2019 0832   BUN 28 (H) 05/19/2019 0832   CREATININE 1.36 (H) 05/19/2019 0832   CALCIUM 11.0 (H) 05/19/2019 0832   CALCIUM  10.2 06/25/2018 1425   PROT 7.2 05/19/2019 0832   ALBUMIN 3.5 05/19/2019 0832   AST 19 05/19/2019 0832   ALT 24 05/19/2019 0832   ALKPHOS 115 05/19/2019 0832   BILITOT 0.3 05/19/2019 0832   GFRNONAA 46 (L) 05/19/2019 0832   GFRAA 54 (L) 05/19/2019 0832    Lab Results  Component Value Date   TOTALPROTELP 7.0 02/25/2019     Lab Results  Component Value Date   KPAFRELGTCHN 76.7 (H) 02/25/2019   LAMBDASER 27.2 (H) 02/25/2019   KAPLAMBRATIO 2.82 (H) 02/25/2019    Lab Results  Component Value Date   WBC 4.2 05/19/2019   NEUTROABS 2.7 05/19/2019   HGB 11.2 (L) 05/19/2019   HCT 35.0 (L) 05/19/2019   MCV 86.2 05/19/2019   PLT 166 05/19/2019    '@LASTCHEMISTRY'$ @  No results found for: LABCA2  No components found for: BJSEGB151  No results for input(s): INR in the last 168 hours.  No results found for: LABCA2  No results found for: CAN199  No results found for: VOH607  No results found for: PXT062  Lab Results  Component Value Date   CA2729 38.7 (H) 02/25/2019    No components found for: HGQUANT  No results found for: CEA1 / No results found for: CEA1   No results found for: AFPTUMOR  No results found for: CHROMOGRNA  No results found for: PSA1  Appointment on 05/19/2019  Component Date Value Ref Range Status  . WBC 05/19/2019 4.2  4.0 - 10.5 K/uL Final  . RBC 05/19/2019 4.06  3.87 - 5.11 MIL/uL Final  . Hemoglobin 05/19/2019 11.2* 12.0 - 15.0 g/dL Final  . HCT 05/19/2019 35.0* 36.0 - 46.0 % Final  . MCV 05/19/2019 86.2  80.0 - 100.0 fL Final  . MCH 05/19/2019 27.6  26.0 - 34.0  pg Final  . MCHC 05/19/2019 32.0  30.0 - 36.0 g/dL Final  . RDW 05/19/2019 14.6  11.5 - 15.5 % Final  . Platelets 05/19/2019 166  150 - 400 K/uL Final  . nRBC 05/19/2019 0.0  0.0 - 0.2 % Final  . Neutrophils Relative % 05/19/2019 66  % Final  . Neutro Abs 05/19/2019 2.7  1.7 - 7.7 K/uL Final  . Lymphocytes Relative 05/19/2019 20  % Final  . Lymphs Abs 05/19/2019 0.8  0.7 - 4.0 K/uL Final  . Monocytes Relative 05/19/2019 12  % Final  . Monocytes Absolute 05/19/2019 0.5  0.1 - 1.0 K/uL Final  . Eosinophils Relative 05/19/2019 2  % Final  . Eosinophils Absolute 05/19/2019 0.1  0.0 - 0.5 K/uL Final  . Basophils Relative 05/19/2019 0  % Final  . Basophils Absolute 05/19/2019 0.0  0.0 - 0.1 K/uL Final  . Immature Granulocytes 05/19/2019 0  % Final  . Abs Immature Granulocytes 05/19/2019 0.01  0.00 - 0.07 K/uL Final   Performed at [facility], [place] [address], [place], [place] [address]  . Sodium 05/19/2019 141  135 - 145 mmol/L Final  . Potassium 05/19/2019 4.2  3.5 - 5.1 mmol/L Final  . Chloride 05/19/2019 108  98 - 111 mmol/L Final  . CO2 05/19/2019 24  22 - 32 mmol/L Final  . Glucose, Bld 05/19/2019 101* 70 - 99 mg/dL Final  . BUN 05/19/2019 28* 6 - 20 mg/dL Final  . Creatinine 05/19/2019 1.36* 0.44 - 1.00 mg/dL Final  . Calcium 05/19/2019 11.0* 8.9 - 10.3 mg/dL Final  . Total Protein 05/19/2019 7.2  6.5 - 8.1 g/dL Final  .  Albumin 05/19/2019 3.5  3.5 - 5.0 g/dL Final  . AST 05/19/2019 19  15 - 41 U/L Final  . ALT 05/19/2019 24  0 - 44 U/L Final  . Alkaline Phosphatase 05/19/2019 115  38 - 126 U/L Final  . Total Bilirubin 05/19/2019 0.3  0.3 - 1.2 mg/dL Final  . GFR, Est Non Af Am 05/19/2019 46* >60 mL/min Final  . GFR, Est AFR Am 05/19/2019 54* >60 mL/min Final  . Anion gap 05/19/2019 9  5 - 15 Final   Performed at [facility], [place] [address] [place], [place] [address]    (this displays the last labs from the last 3 days)  Lab  Results  Component Value Date   TOTALPROTELP 7.0 02/25/2019   (this displays SPEP labs)  Lab Results  Component Value Date   KPAFRELGTCHN 76.7 (H) 02/25/2019   LAMBDASER 27.2 (H) 02/25/2019   KAPLAMBRATIO 2.82 (H) 02/25/2019   (kappa/lambda light chains)  No results found for: HGBA, HGBA2QUANT, HGBFQUANT, HGBSQUAN (Hemoglobinopathy evaluation)   No results found for: LDH  No results found for: IRON, TIBC, IRONPCTSAT (Iron and TIBC)  No results found for: FERRITIN  Urinalysis    Component Value Date/Time   COLORURINE STRAW (A) 03/17/2017 0600   APPEARANCEUR CLEAR (A) 03/17/2017 0600   LABSPEC 1.031 (H) 03/17/2017 0600   PHURINE 5.0 03/17/2017 0600   GLUCOSEU NEGATIVE 03/17/2017 0600   HGBUR NEGATIVE 03/17/2017 0600   BILIRUBINUR NEGATIVE 03/17/2017 0600   KETONESUR NEGATIVE 03/17/2017 0600   PROTEINUR NEGATIVE 03/17/2017 0600   UROBILINOGEN 0.2 11/29/2011 1656   NITRITE NEGATIVE 03/17/2017 0600   LEUKOCYTESUR NEGATIVE 03/17/2017 0600     STUDIES: Mr Thoracic Spine W Wo Contrast  Result Date: 05/03/2019 CLINICAL DATA:  S RS targeting of metastatic breast cancer spinal lesion. EXAM: MRI THORACIC WITHOUT AND WITH CONTRAST TECHNIQUE: Multiplanar and multiecho pulse sequences of the thoracic spine were obtained without and with intravenous contrast. CONTRAST:  10 cc Gadavist COMPARISON:  02/04/2019.  03/11/2019. FINDINGS: MRI THORACIC SPINE FINDINGS Alignment:  Normal Vertebrae: Chronic superior endplate Schmorl's node deformity at T6. Metastatic disease within the posterior elements on the right at T3 with extension into the spinous process. Right pedicle is involved. There may be the most minimal epidural tumor on the right but without compression of the neural structures. There is a spinous process metastasis at the tip of the T5 spinous process. No other regional focal finding the spine. On the sagittal images, a right sided sternal metastasis is visible. Cord:  No cord  compression or primary cord lesion. Paraspinal and other soft tissues: Otherwise negative Disc levels: No significant disc degeneration in the region.  No spinal stenosis. IMPRESSION: Metastatic disease affecting the right pedicle, transverse process and lamina at T3, with spinous process involvement. Early right-sided epidural tumor but without compressive effect at this time. Focal metastasis to the tip of the spinous process at T5. Metastasis to the right side of the sternum, primarily or evaluated. Electronically Signed   By: Nelson Chimes M.D.   On: 05/03/2019 14:01    ELIGIBLE FOR AVAILABLE RESEARCH PROTOCOL: no   ASSESSMENT: 48 y.o. [place], [place] woman  (1) status post left lumpectomy in 2000 for a (?) T2N0 breast cancer,  (a) status post adjuvant chemotherapy  (b) status post adjuvant radiation  (c) did not receive antiestrogens  (2) genetics testing May 18, 2018 through the common Hereditary Cancers Panel + Myelodysplastic Syndrome/Leukemia Panel found no deleterious mutations in APC,  ATM, AXIN2, BARD1, BLM, BMPR1A, BRCA1, BRCA2, BRIP1, CDH1, CDK4, CDKN2A (p14ARF), CDKN2A (p16INK4a), CEBPA, CHEK2, CTNNA1, DICER1, EPCAM*, GATA2, GREM1*, HRAS, KIT, MEN1, MLH1, MSH2, MSH3, MSH6, MUTYH, NBN, NF1, PALB2, PDGFRA, PMS2, POLD1, POLE, PTEN, RAD50, RAD51C, RAD51D, RUNX1, SDHB, SDHC, SDHD, SMAD4, SMARCA4, STK11, TERC, TERT, TP53, TSC1, TSC2, VHL The following genes were evaluated for sequence changes only: HOXB13*, NTHL1*, SDHA  (a)  3 variants of uncertain significance were identified in the genes BARD1 c.764A>G (p.Asn255Ser), BRIP1 c.2563C>T (p.Arg855Cys), and PALB2 c.3103A>T (p.Ile1035Phe).   (3) status post right breast biopsy 03/16/2018 for a clinical mT2-3 N2, anatomic stage III invasive ductal carcinoma, grade 2, triple positive, with an MIB-1 of 80%.  (a) bone scan and chest CT scan negative for metastases except for a T6 lytic lesion of concern  (b) thoracic spine MRI was ordered May 2019 but  never performed.  (4) neoadjuvant chemotherapy consisting of carboplatin, docetaxel, trastuzumab and Pertuzumab every 3 weeks x 6 starting 04/16/2018  (a) pertuzumab held beginning with cycle 2 because of diarrhea  (5) trastuzumab to be continued Q4w indefinitely given diagnosis of stage IV disease below  (a) echocardiogram on 06/29/2018 showed an ejection fraction in the 65-70% range  (b) echocardiogram 11/19/2018 showed an ejection fraction in the 60-65% range  (c) echo 04/07/2019 shows an ejection fraction in the 55-60% range  (d) echo 07/15/2019  (6) right mastectomy and sentinel lymph node sampling on 08/26/2018 found a residual 2.5cm invasive ductal carcinoma, with 2 of 5 sentinel lymph nodes positive, ypT2, ypN1a; margins were clear  (7) adjuvant radiation 09/30/2018 - 11/16/2018 Site/dose:   The patient initially received a dose of 50.4 Gy in 28 fractions to the right chest wall and supraclavicular region. This was delivered using a 3-D conformal, 4 field technique. The patient then received a boost to the mastectomy scar. This delivered an additional 10 Gy in 5 fractions using an en face electron field. The total dose was 60.4   (8) anastrozole started 12/03/2018  (a) [place] on 10/01/2018 was 64.3, and estradiol 7.0, consistent with menopause  (b) referral to pelvic floor rehabilitation 12/03/2018  METASTATIC DISEASE: March 2020 (9) CT of neck and CT angiography of the chest 02/04/2019 finds the T6 lytic lesion noted May 2019 (see #3 above) is now sclerotic; a sclerotic T3 lesion is stable; there is a new lytic lesion in the manubrium  (a) PET scan 03/12/2019 shows manubrium, T3 and T5 metastases, no visceral disease  (b) biopsy of manubrial lesion planned but not done secondary to pandemic  (10) SRS to spine, palliative treatment to sternum, ongoing   PLAN: [name] is tolerating her radiation treatments well.  She is also doing fine with her trastuzumab.  I do not believe the  minimal change in the echo is of significance and this will be followed with a repeat echo in August.  She tolerates anastrozole well.  We have not felt it necessary to add a CDK 4 6 inhibitor at this point.  Obviously that would be the next step if we document disease progression.  We reviewed her MRI results which will serve as a new baseline.  She is completing her palliative radiation treatments 05/26/2019.  Sometime in the latter part of this year we will obtain a left breast mammogram.  I am delighted at how well she is doing.  I will see her again in September after her August echocardiogram.  She knows to call for any other issue that may develop before that visit.  Sanjuana Mruk, Virgie Dad, MD  05/19/19 9:26 AM Medical Oncology and Hematology [facility] [address] [place], [place] [address] Tel. [phone]    Fax. [phone]  I, Jacqualyn Posey am acting as a Education administrator for Chauncey Cruel, MD.   I, Lurline Del MD, have reviewed the above documentation for accuracy and completeness, and I agree with the above.

## 2019-05-19 ENCOUNTER — Other Ambulatory Visit: Payer: Self-pay

## 2019-05-19 ENCOUNTER — Inpatient Hospital Stay: Payer: BC Managed Care – PPO

## 2019-05-19 ENCOUNTER — Inpatient Hospital Stay (HOSPITAL_BASED_OUTPATIENT_CLINIC_OR_DEPARTMENT_OTHER): Payer: BC Managed Care – PPO | Admitting: Oncology

## 2019-05-19 ENCOUNTER — Ambulatory Visit
Admission: RE | Admit: 2019-05-19 | Discharge: 2019-05-19 | Disposition: A | Discharge status: Home / Self Care | Payer: BC Managed Care – PPO | Source: Ambulatory Visit | Attending: Radiation Oncology | Admitting: Radiation Oncology

## 2019-05-19 ENCOUNTER — Ambulatory Visit: Payer: BC Managed Care – PPO | Admitting: Radiation Oncology

## 2019-05-19 VITALS — BP 130/67 | HR 83 | Temp 98.0°F | Resp 18 | Ht 59.0 in | Wt 224.9 lb

## 2019-05-19 DIAGNOSIS — C50411 Malignant neoplasm of upper-outer quadrant of right female breast: Secondary | ICD-10-CM

## 2019-05-19 DIAGNOSIS — Z170 Estrogen receptor positive status [ER+]: Secondary | ICD-10-CM

## 2019-05-19 DIAGNOSIS — G629 Polyneuropathy, unspecified: Secondary | ICD-10-CM | POA: Diagnosis not present

## 2019-05-19 DIAGNOSIS — C50811 Malignant neoplasm of overlapping sites of right female breast: Secondary | ICD-10-CM | POA: Diagnosis not present

## 2019-05-19 DIAGNOSIS — Z79899 Other long term (current) drug therapy: Secondary | ICD-10-CM | POA: Diagnosis not present

## 2019-05-19 DIAGNOSIS — I10 Essential (primary) hypertension: Secondary | ICD-10-CM | POA: Diagnosis not present

## 2019-05-19 DIAGNOSIS — Z79811 Long term (current) use of aromatase inhibitors: Secondary | ICD-10-CM | POA: Diagnosis not present

## 2019-05-19 DIAGNOSIS — Z510 Encounter for antineoplastic radiation therapy: Secondary | ICD-10-CM | POA: Diagnosis not present

## 2019-05-19 DIAGNOSIS — C7951 Secondary malignant neoplasm of bone: Secondary | ICD-10-CM

## 2019-05-19 DIAGNOSIS — Z95828 Presence of other vascular implants and grafts: Secondary | ICD-10-CM

## 2019-05-19 DIAGNOSIS — Z8673 Personal history of transient ischemic attack (TIA), and cerebral infarction without residual deficits: Secondary | ICD-10-CM | POA: Diagnosis not present

## 2019-05-19 DIAGNOSIS — Z5112 Encounter for antineoplastic immunotherapy: Secondary | ICD-10-CM | POA: Diagnosis not present

## 2019-05-19 LAB — CBC WITH DIFFERENTIAL/PLATELET
Abs Immature Granulocytes: 0.01 10*3/uL (ref 0.00–0.07)
Basophils Absolute: 0 10*3/uL (ref 0.0–0.1)
Basophils Relative: 0 %
Eosinophils Absolute: 0.1 10*3/uL (ref 0.0–0.5)
Eosinophils Relative: 2 %
HCT: 35 % — ABNORMAL LOW (ref 36.0–46.0)
Hemoglobin: 11.2 g/dL — ABNORMAL LOW (ref 12.0–15.0)
Immature Granulocytes: 0 %
Lymphocytes Relative: 20 %
Lymphs Abs: 0.8 10*3/uL (ref 0.7–4.0)
MCH: 27.6 pg (ref 26.0–34.0)
MCHC: 32 g/dL (ref 30.0–36.0)
MCV: 86.2 fL (ref 80.0–100.0)
Monocytes Absolute: 0.5 10*3/uL (ref 0.1–1.0)
Monocytes Relative: 12 %
Neutro Abs: 2.7 10*3/uL (ref 1.7–7.7)
Neutrophils Relative %: 66 %
Platelets: 166 10*3/uL (ref 150–400)
RBC: 4.06 MIL/uL (ref 3.87–5.11)
RDW: 14.6 % (ref 11.5–15.5)
WBC: 4.2 10*3/uL (ref 4.0–10.5)
nRBC: 0 % (ref 0.0–0.2)

## 2019-05-19 LAB — CMP (CANCER CENTER ONLY)
ALT: 24 U/L (ref 0–44)
AST: 19 U/L (ref 15–41)
Albumin: 3.5 g/dL (ref 3.5–5.0)
Alkaline Phosphatase: 115 U/L (ref 38–126)
Anion gap: 9 (ref 5–15)
BUN: 28 mg/dL — ABNORMAL HIGH (ref 6–20)
CO2: 24 mmol/L (ref 22–32)
Calcium: 11 mg/dL — ABNORMAL HIGH (ref 8.9–10.3)
Chloride: 108 mmol/L (ref 98–111)
Creatinine: 1.36 mg/dL — ABNORMAL HIGH (ref 0.44–1.00)
GFR, Est AFR Am: 54 mL/min — ABNORMAL LOW (ref >60)
GFR, Estimated: 46 mL/min — ABNORMAL LOW (ref >60)
Glucose, Bld: 101 mg/dL — ABNORMAL HIGH (ref 70–99)
Potassium: 4.2 mmol/L (ref 3.5–5.1)
Sodium: 141 mmol/L (ref 135–145)
Total Bilirubin: 0.3 mg/dL (ref 0.3–1.2)
Total Protein: 7.2 g/dL (ref 6.5–8.1)

## 2019-05-19 MED ORDER — ACETAMINOPHEN 325 MG PO TABS
650.0000 mg | ORAL_TABLET | Freq: Once | ORAL | Status: AC
  Administered 2019-05-19: 650 mg via ORAL

## 2019-05-19 MED ORDER — ACETAMINOPHEN 325 MG PO TABS
ORAL_TABLET | ORAL | Status: AC
  Filled 2019-05-19: qty 2

## 2019-05-19 MED ORDER — HEPARIN SOD (PORK) LOCK FLUSH 100 UNIT/ML IV SOLN
500.0000 [IU] | Freq: Once | INTRAVENOUS | Status: AC | PRN
  Administered 2019-05-19: 500 [IU]
  Filled 2019-05-19: qty 5

## 2019-05-19 MED ORDER — SPIRONOLACTONE 50 MG PO TABS: 50.0000 mg | ORAL_TABLET | Freq: Every day | ORAL | 6 refills | Status: DC

## 2019-05-19 MED ORDER — SODIUM CHLORIDE 0.9 % IV SOLN
Freq: Once | INTRAVENOUS | Status: AC
  Administered 2019-05-19: 10:00:00 via INTRAVENOUS
  Filled 2019-05-19: qty 250

## 2019-05-19 MED ORDER — TRASTUZUMAB CHEMO 150 MG IV SOLR
600.0000 mg | Freq: Once | INTRAVENOUS | Status: AC
  Administered 2019-05-19: 600 mg via INTRAVENOUS
  Filled 2019-05-19: qty 28.57

## 2019-05-19 MED ORDER — SODIUM CHLORIDE 0.9% FLUSH
10.0000 mL | INTRAVENOUS | Status: DC | PRN
  Administered 2019-05-19: 10 mL
  Filled 2019-05-19: qty 10

## 2019-05-19 MED ORDER — SODIUM CHLORIDE 0.9% FLUSH
10.0000 mL | Freq: Once | INTRAVENOUS | Status: AC
  Administered 2019-05-19: 10 mL
  Filled 2019-05-19: qty 10

## 2019-05-19 MED ORDER — DIPHENHYDRAMINE HCL 25 MG PO CAPS
ORAL_CAPSULE | ORAL | Status: AC
  Filled 2019-05-19: qty 1

## 2019-05-19 MED ORDER — ANASTROZOLE 1 MG PO TABS: 1.0000 mg | ORAL_TABLET | Freq: Every day | ORAL | 4 refills | Status: DC

## 2019-05-19 MED ORDER — DIPHENHYDRAMINE HCL 25 MG PO CAPS
25.0000 mg | ORAL_CAPSULE | Freq: Once | ORAL | Status: AC
  Administered 2019-05-19: 25 mg via ORAL

## 2019-05-19 NOTE — Patient Instructions (Signed)
Coronavirus (COVID-19) Are you at risk?  Are you at risk for the Coronavirus (COVID-19)?  To be considered HIGH RISK for Coronavirus (COVID-19), you have to meet the following criteria:  . Traveled to [place], [place], [place], [place] or [place]; or in the [place] to [place], [place], [place], or [place]; and have fever, cough, and shortness of breath within the last 2 weeks of travel OR . Been in close contact with a person diagnosed with COVID-19 within the last 2 weeks and have fever, cough, and shortness of breath . IF YOU DO NOT MEET THESE CRITERIA, YOU ARE CONSIDERED LOW RISK FOR COVID-19.  What to do if you are HIGH RISK for COVID-19?  . If you are having a medical emergency, call 911. . Seek medical care right away. Before you go to a doctor's office, urgent care or emergency department, call ahead and tell them about your recent travel, contact with someone diagnosed with COVID-19, and your symptoms. You should receive instructions from your physician's office regarding next steps of care.  . When you arrive at healthcare provider, tell the healthcare staff immediately you have returned from visiting [place], [place], [place], [place] or [place]; or traveled in the [place] to [place], [place], [place], or [place]; in the last two weeks or you have been in close contact with a person diagnosed with COVID-19 in the last 2 weeks.   . Tell the health care staff about your symptoms: fever, cough and shortness of breath. . After you have been seen by a medical provider, you will be either: o Tested for (COVID-19) and discharged home on quarantine except to seek medical care if symptoms worsen, and asked to  - Stay home and avoid contact with others until you get your results (4-5 days)  - Avoid travel on public transportation if possible (such as bus, train, or airplane) or o Sent to the Emergency Department by EMS for evaluation, COVID-19 testing, and possible  admission depending on your condition and test results.  What to do if you are LOW RISK for COVID-19?  Reduce your risk of any infection by using the same precautions used for avoiding the common cold or flu:  . Wash your hands often with soap and warm water for at least 20 seconds.  If soap and water are not readily available, use an alcohol-based hand sanitizer with at least 60% alcohol.  . If coughing or sneezing, cover your mouth and nose by coughing or sneezing into the elbow areas of your shirt or coat, into a tissue or into your sleeve (not your hands). . Avoid shaking hands with others and consider head nods or verbal greetings only. . Avoid touching your eyes, nose, or mouth with unwashed hands.  . Avoid close contact with people who are sick. . Avoid places or events with large numbers of people in one location, like concerts or sporting events. . Carefully consider travel plans you have or are making. . If you are planning any travel outside or inside the [place], visit the CDC's Travelers' Health webpage for the latest health notices. . If you have some symptoms but not all symptoms, continue to monitor at home and seek medical attention if your symptoms worsen. . If you are having a medical emergency, call 911.   ADDITIONAL HEALTHCARE OPTIONS FOR PATIENTS  [facility] / e-Visit: [email or web address]         [facility]: [phone]  [facility]: [phone]                   [facility]: [phone]    [facility] Discharge Instructions for Patients Receiving Chemotherapy  Today you received the following chemotherapy agents Herceptin   To help prevent nausea and vomiting after your treatment, we encourage you to take your nausea medication as directed.    If you develop nausea and vomiting that is not controlled by your nausea medication, call the clinic.   BELOW  ARE SYMPTOMS THAT SHOULD BE REPORTED IMMEDIATELY:  *FEVER GREATER THAN 100.5 F  *CHILLS WITH OR WITHOUT FEVER  NAUSEA AND VOMITING THAT IS NOT CONTROLLED WITH YOUR NAUSEA MEDICATION  *UNUSUAL SHORTNESS OF BREATH  *UNUSUAL BRUISING OR BLEEDING  TENDERNESS IN MOUTH AND THROAT WITH OR WITHOUT PRESENCE OF ULCERS  *URINARY PROBLEMS  *BOWEL PROBLEMS  UNUSUAL RASH Items with * indicate a potential emergency and should be followed up as soon as possible.  Feel free to call the clinic should you have any questions or concerns. The clinic phone number is [phone].  Please show the CHEMO ALERT CARD at check-in to the Emergency Department and triage nurse.   

## 2019-05-19 NOTE — Addendum Note (Signed)
Addended by: Chauncey Cruel on: 05/19/2019 09:36 AM   Modules accepted: Orders

## 2019-05-20 ENCOUNTER — Ambulatory Visit
Admission: RE | Admit: 2019-05-20 | Discharge: 2019-05-20 | Disposition: A | Discharge status: Home / Self Care | Payer: BC Managed Care – PPO | Source: Ambulatory Visit | Attending: Radiation Oncology | Admitting: Radiation Oncology

## 2019-05-20 ENCOUNTER — Telehealth: Payer: Self-pay

## 2019-05-20 ENCOUNTER — Other Ambulatory Visit: Payer: Self-pay

## 2019-05-20 DIAGNOSIS — Z170 Estrogen receptor positive status [ER+]: Secondary | ICD-10-CM | POA: Diagnosis not present

## 2019-05-20 DIAGNOSIS — C7951 Secondary malignant neoplasm of bone: Secondary | ICD-10-CM | POA: Diagnosis not present

## 2019-05-20 DIAGNOSIS — C50411 Malignant neoplasm of upper-outer quadrant of right female breast: Secondary | ICD-10-CM | POA: Diagnosis not present

## 2019-05-20 DIAGNOSIS — Z510 Encounter for antineoplastic radiation therapy: Secondary | ICD-10-CM | POA: Diagnosis not present

## 2019-05-20 NOTE — Telephone Encounter (Signed)
Received call from [facility] regarding pt referral.  Msg did not have good reception therefore some details were missed. Caller stated that the physician the pt had been referred to was no longer at that practice but there were other physicians who could see the pt. Caller stated they still needed demographics and path report. Call back number [phone] Caller did not give name. Fax number:  [phone]  Demographics, path report and last office note faxed to above number. Also lvm at above number.

## 2019-05-21 ENCOUNTER — Other Ambulatory Visit: Payer: Self-pay

## 2019-05-21 ENCOUNTER — Ambulatory Visit
Admission: RE | Admit: 2019-05-21 | Discharge: 2019-05-21 | Disposition: A | Discharge status: Home / Self Care | Payer: BC Managed Care – PPO | Source: Ambulatory Visit | Attending: Radiation Oncology | Admitting: Radiation Oncology

## 2019-05-21 DIAGNOSIS — C50411 Malignant neoplasm of upper-outer quadrant of right female breast: Secondary | ICD-10-CM | POA: Diagnosis not present

## 2019-05-21 DIAGNOSIS — Z510 Encounter for antineoplastic radiation therapy: Secondary | ICD-10-CM | POA: Diagnosis not present

## 2019-05-21 DIAGNOSIS — C7951 Secondary malignant neoplasm of bone: Secondary | ICD-10-CM | POA: Diagnosis not present

## 2019-05-21 DIAGNOSIS — Z170 Estrogen receptor positive status [ER+]: Secondary | ICD-10-CM | POA: Diagnosis not present

## 2019-05-24 ENCOUNTER — Other Ambulatory Visit: Payer: Self-pay

## 2019-05-24 ENCOUNTER — Telehealth: Payer: Self-pay | Admitting: *Deleted

## 2019-05-24 ENCOUNTER — Ambulatory Visit
Admission: RE | Admit: 2019-05-24 | Discharge: 2019-05-24 | Disposition: A | Discharge status: Home / Self Care | Payer: BC Managed Care – PPO | Source: Ambulatory Visit | Attending: Radiation Oncology | Admitting: Radiation Oncology

## 2019-05-24 DIAGNOSIS — C7951 Secondary malignant neoplasm of bone: Secondary | ICD-10-CM | POA: Diagnosis not present

## 2019-05-24 DIAGNOSIS — C50411 Malignant neoplasm of upper-outer quadrant of right female breast: Secondary | ICD-10-CM | POA: Diagnosis not present

## 2019-05-24 DIAGNOSIS — Z510 Encounter for antineoplastic radiation therapy: Secondary | ICD-10-CM | POA: Diagnosis not present

## 2019-05-24 DIAGNOSIS — Z170 Estrogen receptor positive status [ER+]: Secondary | ICD-10-CM | POA: Diagnosis not present

## 2019-05-24 MED ORDER — SPIRONOLACTONE-HCTZ 25-25 MG PO TABS: 1.0000 | ORAL_TABLET | Freq: Every day | ORAL | 2 refills | Status: DC

## 2019-05-24 NOTE — Telephone Encounter (Signed)
This RN spoke with pt per her request to clarify prescription refill by MD recently for blood pressure medication.  [place] states she needs spirolactone with hctz reordered ( md refilled per medication list as just spirolactone ).  This RN called pt's pharmacy and verified per formulary pt is taking aldactizide 25/25.  Refill given for aldactizide as well as d/c order for spirolactone.

## 2019-05-25 ENCOUNTER — Other Ambulatory Visit: Payer: Self-pay

## 2019-05-25 ENCOUNTER — Ambulatory Visit
Admission: RE | Admit: 2019-05-25 | Discharge: 2019-05-25 | Disposition: A | Discharge status: Home / Self Care | Payer: BC Managed Care – PPO | Source: Ambulatory Visit | Attending: Radiation Oncology | Admitting: Radiation Oncology

## 2019-05-25 DIAGNOSIS — C50411 Malignant neoplasm of upper-outer quadrant of right female breast: Secondary | ICD-10-CM | POA: Diagnosis not present

## 2019-05-25 DIAGNOSIS — Z170 Estrogen receptor positive status [ER+]: Secondary | ICD-10-CM | POA: Diagnosis not present

## 2019-05-25 DIAGNOSIS — C7951 Secondary malignant neoplasm of bone: Secondary | ICD-10-CM | POA: Diagnosis not present

## 2019-05-25 DIAGNOSIS — Z510 Encounter for antineoplastic radiation therapy: Secondary | ICD-10-CM | POA: Diagnosis not present

## 2019-05-26 ENCOUNTER — Ambulatory Visit
Admission: RE | Admit: 2019-05-26 | Discharge: 2019-05-26 | Disposition: A | Discharge status: Home / Self Care | Payer: BC Managed Care – PPO | Source: Ambulatory Visit | Attending: Radiation Oncology | Admitting: Radiation Oncology

## 2019-05-26 ENCOUNTER — Other Ambulatory Visit: Payer: Self-pay

## 2019-05-26 ENCOUNTER — Encounter: Payer: Self-pay | Admitting: Radiation Oncology

## 2019-05-26 ENCOUNTER — Other Ambulatory Visit: Payer: Self-pay | Admitting: Oncology

## 2019-05-26 DIAGNOSIS — Z170 Estrogen receptor positive status [ER+]: Secondary | ICD-10-CM | POA: Diagnosis not present

## 2019-05-26 DIAGNOSIS — Z510 Encounter for antineoplastic radiation therapy: Secondary | ICD-10-CM | POA: Diagnosis not present

## 2019-05-26 DIAGNOSIS — C7951 Secondary malignant neoplasm of bone: Secondary | ICD-10-CM | POA: Diagnosis not present

## 2019-05-26 DIAGNOSIS — C50411 Malignant neoplasm of upper-outer quadrant of right female breast: Secondary | ICD-10-CM | POA: Diagnosis not present

## 2019-05-28 NOTE — Progress Notes (Signed)
  Name: [name] [name]  MRN: [id number]  Date: 05/18/2019   DOB: 1971/10/12  Stereotactic Radiosurgery Operative Note  PRE-OPERATIVE DIAGNOSIS:  Multiple Spinal Metastases  POST-OPERATIVE DIAGNOSIS:  Multiple Spinal Metastases  PROCEDURE:  Stereotactic Radiosurgery  SURGEON:  Judith Part, MD  NARRATIVE: The patient underwent a radiation treatment planning session in the radiation oncology simulation suite under the care of the radiation oncology physician and physicist.  I participated closely in the radiation treatment planning afterwards. The patient underwent planning CT which was fused to 3T high resolution MRI with 1 mm axial slices.  These images were fused on the planning system.  We contoured the gross target volumes and subsequently expanded this to yield the Planning Target Volume. I actively participated in the planning process.  I helped to define and review the target contours and also the contours of the optic pathway, eyes, brainstem and selected nearby organs at risk.  All the dose constraints for critical structures were reviewed and compared to AAPM Task Group 101.  The prescription dose conformity was reviewed.  I approved the plan electronically.    Accordingly, [name] [name] was brought to the TrueBeam stereotactic radiation treatment linac and placed in the custom immobilization mask.  The patient was aligned according to the IR fiducial markers with BrainLab Exactrac, then orthogonal x-rays were used in ExacTrac with the 6DOF robotic table and the shifts were made to align the patient  [name] [name] received stereotactic radiosurgery uneventfully.    Lesions treated:  2   Complex lesions treated:  0 (>3.5 cm, <44mm of optic path, or within the brainstem)   The detailed description of the procedure is recorded in the radiation oncology procedure note.  I was present for the duration of the procedure.  DISPOSITION:  Following delivery, the patient was transported to  nursing in stable condition and monitored for possible acute effects to be discharged to home in stable condition with follow-up in one month.  Judith Part, MD 05/28/2019 11:49 AM

## 2019-06-16 ENCOUNTER — Inpatient Hospital Stay: Payer: BC Managed Care – PPO

## 2019-06-16 ENCOUNTER — Inpatient Hospital Stay: Payer: BC Managed Care – PPO | Attending: Oncology

## 2019-06-16 ENCOUNTER — Encounter: Payer: Self-pay | Admitting: *Deleted

## 2019-06-16 ENCOUNTER — Encounter: Payer: Self-pay | Admitting: General Practice

## 2019-06-16 ENCOUNTER — Other Ambulatory Visit: Payer: Self-pay | Admitting: Oncology

## 2019-06-16 ENCOUNTER — Other Ambulatory Visit: Payer: Self-pay

## 2019-06-16 VITALS — BP 126/72 | HR 65 | Temp 98.3°F | Resp 18 | Wt 223.0 lb

## 2019-06-16 DIAGNOSIS — Z170 Estrogen receptor positive status [ER+]: Secondary | ICD-10-CM

## 2019-06-16 DIAGNOSIS — Z95828 Presence of other vascular implants and grafts: Secondary | ICD-10-CM

## 2019-06-16 DIAGNOSIS — C7951 Secondary malignant neoplasm of bone: Secondary | ICD-10-CM | POA: Insufficient documentation

## 2019-06-16 DIAGNOSIS — Z79899 Other long term (current) drug therapy: Secondary | ICD-10-CM | POA: Diagnosis not present

## 2019-06-16 DIAGNOSIS — Z5112 Encounter for antineoplastic immunotherapy: Secondary | ICD-10-CM | POA: Insufficient documentation

## 2019-06-16 DIAGNOSIS — C50411 Malignant neoplasm of upper-outer quadrant of right female breast: Secondary | ICD-10-CM | POA: Diagnosis not present

## 2019-06-16 LAB — CBC WITH DIFFERENTIAL/PLATELET
Abs Immature Granulocytes: 0.01 10*3/uL (ref 0.00–0.07)
Basophils Absolute: 0 10*3/uL (ref 0.0–0.1)
Basophils Relative: 1 %
Eosinophils Absolute: 0.1 10*3/uL (ref 0.0–0.5)
Eosinophils Relative: 2 %
HCT: 35 % — ABNORMAL LOW (ref 36.0–46.0)
Hemoglobin: 11.3 g/dL — ABNORMAL LOW (ref 12.0–15.0)
Immature Granulocytes: 0 %
Lymphocytes Relative: 22 %
Lymphs Abs: 0.9 10*3/uL (ref 0.7–4.0)
MCH: 27.2 pg (ref 26.0–34.0)
MCHC: 32.3 g/dL (ref 30.0–36.0)
MCV: 84.3 fL (ref 80.0–100.0)
Monocytes Absolute: 0.6 10*3/uL (ref 0.1–1.0)
Monocytes Relative: 14 %
Neutro Abs: 2.7 10*3/uL (ref 1.7–7.7)
Neutrophils Relative %: 61 %
Platelets: 156 10*3/uL (ref 150–400)
RBC: 4.15 MIL/uL (ref 3.87–5.11)
RDW: 14.5 % (ref 11.5–15.5)
WBC: 4.3 10*3/uL (ref 4.0–10.5)
nRBC: 0 % (ref 0.0–0.2)

## 2019-06-16 LAB — COMPREHENSIVE METABOLIC PANEL
ALT: 25 U/L (ref 0–44)
AST: 19 U/L (ref 15–41)
Albumin: 3.6 g/dL (ref 3.5–5.0)
Alkaline Phosphatase: 120 U/L (ref 38–126)
Anion gap: 8 (ref 5–15)
BUN: 25 mg/dL — ABNORMAL HIGH (ref 6–20)
CO2: 25 mmol/L (ref 22–32)
Calcium: 11 mg/dL — ABNORMAL HIGH (ref 8.9–10.3)
Chloride: 105 mmol/L (ref 98–111)
Creatinine, Ser: 1.31 mg/dL — ABNORMAL HIGH (ref 0.44–1.00)
GFR calc Af Amer: 56 mL/min — ABNORMAL LOW (ref >60)
GFR calc non Af Amer: 48 mL/min — ABNORMAL LOW (ref >60)
Glucose, Bld: 103 mg/dL — ABNORMAL HIGH (ref 70–99)
Potassium: 4.1 mmol/L (ref 3.5–5.1)
Sodium: 138 mmol/L (ref 135–145)
Total Bilirubin: 0.3 mg/dL (ref 0.3–1.2)
Total Protein: 7.3 g/dL (ref 6.5–8.1)

## 2019-06-16 MED ORDER — SODIUM CHLORIDE 0.9 % IV SOLN
Freq: Once | INTRAVENOUS | Status: AC
  Administered 2019-06-16: 09:00:00 via INTRAVENOUS
  Filled 2019-06-16: qty 250

## 2019-06-16 MED ORDER — SODIUM CHLORIDE 0.9% FLUSH
10.0000 mL | INTRAVENOUS | Status: DC | PRN
  Administered 2019-06-16: 10 mL
  Filled 2019-06-16: qty 10

## 2019-06-16 MED ORDER — HEPARIN SOD (PORK) LOCK FLUSH 100 UNIT/ML IV SOLN
500.0000 [IU] | Freq: Once | INTRAVENOUS | Status: AC | PRN
  Administered 2019-06-16: 500 [IU]
  Filled 2019-06-16: qty 5

## 2019-06-16 MED ORDER — DIPHENHYDRAMINE HCL 25 MG PO CAPS
25.0000 mg | ORAL_CAPSULE | Freq: Once | ORAL | Status: AC
  Administered 2019-06-16: 25 mg via ORAL

## 2019-06-16 MED ORDER — SODIUM CHLORIDE 0.9% FLUSH
10.0000 mL | Freq: Once | INTRAVENOUS | Status: AC
  Administered 2019-06-16: 10 mL
  Filled 2019-06-16: qty 10

## 2019-06-16 MED ORDER — ACETAMINOPHEN 325 MG PO TABS
ORAL_TABLET | ORAL | Status: AC
  Filled 2019-06-16: qty 2

## 2019-06-16 MED ORDER — DIPHENHYDRAMINE HCL 25 MG PO CAPS
ORAL_CAPSULE | ORAL | Status: AC
  Filled 2019-06-16: qty 1

## 2019-06-16 MED ORDER — ACETAMINOPHEN 325 MG PO TABS
650.0000 mg | ORAL_TABLET | Freq: Once | ORAL | Status: AC
  Administered 2019-06-16: 650 mg via ORAL

## 2019-06-16 MED ORDER — TRASTUZUMAB CHEMO 150 MG IV SOLR
600.0000 mg | Freq: Once | INTRAVENOUS | Status: AC
  Administered 2019-06-16: 600 mg via INTRAVENOUS
  Filled 2019-06-16: qty 28.57

## 2019-06-16 NOTE — Progress Notes (Signed)
[place] Work  Clinical Social Work received referral from breast navigator for assistance applying for social security disability.  CSW contacted patient at home to offer support and assess for needs.  CSW left patient a message with information on the servant canter.  CSW encouraged patient to call back so referral can be completed.    Johnnye Lana, MSW, LCSW, OSW-C Clinical Social Worker [facility] [phone]

## 2019-06-16 NOTE — Progress Notes (Signed)
[place] CSW Progress Notes  REferral sent to [facility] for help w disability application.  Edwyna Shell, LCSW Clinical Social Worker Phone:  [phone]

## 2019-06-16 NOTE — Progress Notes (Signed)
Again he has bone disease and persistently elevated calciums.  I am going to add Xgeva to her treatments, beginning 07/14/2019.  We will repeat this every 6 weeks.

## 2019-06-16 NOTE — Patient Instructions (Signed)
[facility] Discharge Instructions for Patients Receiving Chemotherapy  Today you received the following chemotherapy agents Herceptin  To help prevent nausea and vomiting after your treatment, we encourage you to take your nausea medication as directed   If you develop nausea and vomiting that is not controlled by your nausea medication, call the clinic.   BELOW ARE SYMPTOMS THAT SHOULD BE REPORTED IMMEDIATELY:  *FEVER GREATER THAN 100.5 F  *CHILLS WITH OR WITHOUT FEVER  NAUSEA AND VOMITING THAT IS NOT CONTROLLED WITH YOUR NAUSEA MEDICATION  *UNUSUAL SHORTNESS OF BREATH  *UNUSUAL BRUISING OR BLEEDING  TENDERNESS IN MOUTH AND THROAT WITH OR WITHOUT PRESENCE OF ULCERS  *URINARY PROBLEMS  *BOWEL PROBLEMS  UNUSUAL RASH Items with * indicate a potential emergency and should be followed up as soon as possible.  Feel free to call the clinic should you have any questions or concerns. The clinic phone number is [phone].  Please show the CHEMO ALERT CARD at check-in to the Emergency Department and triage nurse.   

## 2019-06-17 LAB — FOLLICLE STIMULATING HORMONE: FSH: 69.5 m[IU]/mL

## 2019-06-20 ENCOUNTER — Other Ambulatory Visit: Payer: Self-pay | Admitting: Oncology

## 2019-06-21 LAB — ESTRADIOL, ULTRA SENS: Estradiol, Sensitive: <2.5 pg/mL

## 2019-07-06 ENCOUNTER — Telehealth: Payer: Self-pay | Admitting: Radiation Oncology

## 2019-07-06 NOTE — Telephone Encounter (Signed)
  Radiation Oncology         [phone]) [phone] ________________________________  Name: [name] [name] MRN: [id number]  Date of Service: 07/06/2019  DOB: 03/22/1971  Post Treatment Telephone Note  Diagnosis:  Recurrent Metastatic Stage IIA, cT2N2M0 grade 2, Triple positive invasive ductal carcinoma of the right breast.  Interval Since Last Radiation:  7 weeks   05/19/2019 SRS Treatment: T5 was treated to 18 Gy in 1 fraction  05/18/2019 SRS Treatment:  T3 was treated to 18 Gy in 1 fraction  05/13/2019-05/26/2019:  30 Gy in 10 fractions was given to the sternum  09/30/2018 - 11/16/2018: The patient initially received a dose of 50.4 Gy in 28 fractions to the rightchest wall and supraclavicular region. This was delivered using a 3-D conformal, 4 field technique. The patient then received a boost to the mastectomy scar. This delivered an additional 10 Gy in 5 fractions using an en face electron field. The total dose was 60.4 Gy.  2000: external beam radiotherapy to the left breast with Dr. Lucia Gaskins at [facility] in [place].   Narrative:  The patient was contacted today for routine follow-up. During treatment she did very well with radiotherapy and did not have significant desquamation. She reports she has had improvement in her pain in the sternum and spine. She continues with Herceptin and Xgeva with Dr. Jana Hakim.  Impression/Plan: 1. Recurrent Metastatic Stage IIA, cT2N2M0 grade 2, Triple positive invasive ductal carcinoma of the right breast. The patient has been doing well since completion of radiotherapy. We discussed that we would be happy to continue to follow her as needed, but she will also continue to follow up with Dr. Jana Hakim in medical oncology. We will plan repeat imaging in September 2020 of the spine with MRI and annual complete spine series.     Carola Rhine, PAC

## 2019-07-06 NOTE — Progress Notes (Signed)
  Radiation Oncology         [phone]) [phone] ________________________________  Name: [name] [name] MRN: [id number]  Date: 05/26/2019  DOB: 1971-02-12  End of Treatment Note  Diagnosis:   48 y.o. female with Recurrent Metastatic Stage IIA, cT2N2M0 grade 2, Triple positive invasive ductal carcinoma of the right breast     Indication for treatment:  Palliative       Radiation treatment dates:    1. 05/13/2019 - 05/26/2019 2. 05/18/2019 3. 05/19/2019  Site/dose:    1. Chest_sternum // 40 Gy in 10 fractions 2. Thoracic Spine (T3) // 18 Gy in 1 fraction 3. Thoracic Spine (T5) // 18 Gy in 1 fraction  Beams/energy:    1. 3D // 6X, 10X Photon 2. SBRT/SRT-IMRT // 10X-FFF Photon  3. SBRT/SRT-IMRT // 10X-FFF Photon  Narrative: The patient tolerated radiation treatment relatively well.  No major skin changes in the treated areas. She had some increased fatigue. She reports improvement in her pain and is not currently taking any pain medications.  Plan: The patient has completed radiation treatment. The patient will return to radiation oncology clinic for routine followup in one month. I advised them to call or return sooner if they have any questions or concerns related to their recovery or treatment.  ------------------------------------------------  Jodelle Gross, MD, PhD  This document serves as a record of services personally performed by Kyung Rudd, MD. It was created on his behalf by Rae Lips, a trained medical scribe. The creation of this record is based on the scribe's personal observations and the provider's statements to them. This document has been checked and approved by the attending provider.

## 2019-07-07 ENCOUNTER — Encounter (HOSPITAL_COMMUNITY): Payer: Self-pay | Admitting: Oncology

## 2019-07-12 DIAGNOSIS — F4325 Adjustment disorder with mixed disturbance of emotions and conduct: Secondary | ICD-10-CM | POA: Diagnosis not present

## 2019-07-14 ENCOUNTER — Other Ambulatory Visit: Payer: Self-pay | Admitting: Hematology and Oncology

## 2019-07-14 ENCOUNTER — Other Ambulatory Visit: Payer: Self-pay

## 2019-07-14 ENCOUNTER — Inpatient Hospital Stay: Payer: BC Managed Care – PPO

## 2019-07-14 ENCOUNTER — Other Ambulatory Visit: Payer: BC Managed Care – PPO

## 2019-07-14 ENCOUNTER — Encounter: Payer: Self-pay | Admitting: Adult Health

## 2019-07-14 ENCOUNTER — Telehealth: Payer: Self-pay | Admitting: Oncology

## 2019-07-14 ENCOUNTER — Inpatient Hospital Stay: Payer: BC Managed Care – PPO | Attending: Oncology

## 2019-07-14 ENCOUNTER — Inpatient Hospital Stay (HOSPITAL_BASED_OUTPATIENT_CLINIC_OR_DEPARTMENT_OTHER): Payer: BC Managed Care – PPO | Admitting: Adult Health

## 2019-07-14 ENCOUNTER — Ambulatory Visit: Payer: BC Managed Care – PPO

## 2019-07-14 VITALS — BP 130/60 | HR 65 | Temp 97.8°F | Resp 18 | Ht 59.0 in | Wt 226.2 lb

## 2019-07-14 DIAGNOSIS — Z8673 Personal history of transient ischemic attack (TIA), and cerebral infarction without residual deficits: Secondary | ICD-10-CM | POA: Diagnosis not present

## 2019-07-14 DIAGNOSIS — Z170 Estrogen receptor positive status [ER+]: Secondary | ICD-10-CM | POA: Insufficient documentation

## 2019-07-14 DIAGNOSIS — I10 Essential (primary) hypertension: Secondary | ICD-10-CM | POA: Diagnosis not present

## 2019-07-14 DIAGNOSIS — C7951 Secondary malignant neoplasm of bone: Secondary | ICD-10-CM | POA: Insufficient documentation

## 2019-07-14 DIAGNOSIS — C50411 Malignant neoplasm of upper-outer quadrant of right female breast: Secondary | ICD-10-CM | POA: Insufficient documentation

## 2019-07-14 DIAGNOSIS — Z79811 Long term (current) use of aromatase inhibitors: Secondary | ICD-10-CM | POA: Diagnosis not present

## 2019-07-14 DIAGNOSIS — Z79899 Other long term (current) drug therapy: Secondary | ICD-10-CM | POA: Diagnosis not present

## 2019-07-14 DIAGNOSIS — Z5112 Encounter for antineoplastic immunotherapy: Secondary | ICD-10-CM | POA: Insufficient documentation

## 2019-07-14 DIAGNOSIS — Z95828 Presence of other vascular implants and grafts: Secondary | ICD-10-CM

## 2019-07-14 LAB — CBC WITH DIFFERENTIAL/PLATELET
Abs Immature Granulocytes: 0.02 10*3/uL (ref 0.00–0.07)
Basophils Absolute: 0 10*3/uL (ref 0.0–0.1)
Basophils Relative: 1 %
Eosinophils Absolute: 0.1 10*3/uL (ref 0.0–0.5)
Eosinophils Relative: 2 %
HCT: 34.6 % — ABNORMAL LOW (ref 36.0–46.0)
Hemoglobin: 11.2 g/dL — ABNORMAL LOW (ref 12.0–15.0)
Immature Granulocytes: 1 %
Lymphocytes Relative: 19 %
Lymphs Abs: 0.8 10*3/uL (ref 0.7–4.0)
MCH: 27.3 pg (ref 26.0–34.0)
MCHC: 32.4 g/dL (ref 30.0–36.0)
MCV: 84.2 fL (ref 80.0–100.0)
Monocytes Absolute: 0.6 10*3/uL (ref 0.1–1.0)
Monocytes Relative: 14 %
Neutro Abs: 2.7 10*3/uL (ref 1.7–7.7)
Neutrophils Relative %: 63 %
Platelets: 163 10*3/uL (ref 150–400)
RBC: 4.11 MIL/uL (ref 3.87–5.11)
RDW: 14.7 % (ref 11.5–15.5)
WBC: 4.3 10*3/uL (ref 4.0–10.5)
nRBC: 0 % (ref 0.0–0.2)

## 2019-07-14 LAB — COMPREHENSIVE METABOLIC PANEL
ALT: 27 U/L (ref 0–44)
AST: 19 U/L (ref 15–41)
Albumin: 3.6 g/dL (ref 3.5–5.0)
Alkaline Phosphatase: 113 U/L (ref 38–126)
Anion gap: 12 (ref 5–15)
BUN: 33 mg/dL — ABNORMAL HIGH (ref 6–20)
CO2: 22 mmol/L (ref 22–32)
Calcium: 11.1 mg/dL — ABNORMAL HIGH (ref 8.9–10.3)
Chloride: 103 mmol/L (ref 98–111)
Creatinine, Ser: 1.41 mg/dL — ABNORMAL HIGH (ref 0.44–1.00)
GFR calc Af Amer: 51 mL/min — ABNORMAL LOW (ref >60)
GFR calc non Af Amer: 44 mL/min — ABNORMAL LOW (ref >60)
Glucose, Bld: 104 mg/dL — ABNORMAL HIGH (ref 70–99)
Potassium: 4.1 mmol/L (ref 3.5–5.1)
Sodium: 137 mmol/L (ref 135–145)
Total Bilirubin: 0.3 mg/dL (ref 0.3–1.2)
Total Protein: 7.4 g/dL (ref 6.5–8.1)

## 2019-07-14 MED ORDER — SODIUM CHLORIDE 0.9% FLUSH
10.0000 mL | Freq: Once | INTRAVENOUS | Status: AC
  Administered 2019-07-14: 10 mL
  Filled 2019-07-14: qty 10

## 2019-07-14 MED ORDER — SODIUM CHLORIDE 0.9 % IV SOLN
Freq: Once | INTRAVENOUS | Status: AC
  Administered 2019-07-14: 10:00:00 via INTRAVENOUS
  Filled 2019-07-14: qty 250

## 2019-07-14 MED ORDER — SODIUM CHLORIDE 0.9% FLUSH
10.0000 mL | INTRAVENOUS | Status: DC | PRN
  Administered 2019-07-14: 10 mL
  Filled 2019-07-14: qty 10

## 2019-07-14 MED ORDER — ACETAMINOPHEN 325 MG PO TABS
ORAL_TABLET | ORAL | Status: AC
  Filled 2019-07-14: qty 2

## 2019-07-14 MED ORDER — ACETAMINOPHEN 325 MG PO TABS
650.0000 mg | ORAL_TABLET | Freq: Once | ORAL | Status: AC
  Administered 2019-07-14: 650 mg via ORAL

## 2019-07-14 MED ORDER — TRASTUZUMAB CHEMO 150 MG IV SOLR
600.0000 mg | Freq: Once | INTRAVENOUS | Status: AC
  Administered 2019-07-14: 600 mg via INTRAVENOUS
  Filled 2019-07-14: qty 28.57

## 2019-07-14 MED ORDER — HEPARIN SOD (PORK) LOCK FLUSH 100 UNIT/ML IV SOLN
500.0000 [IU] | Freq: Once | INTRAVENOUS | Status: AC | PRN
  Administered 2019-07-14: 500 [IU]
  Filled 2019-07-14: qty 5

## 2019-07-14 MED ORDER — DIPHENHYDRAMINE HCL 25 MG PO CAPS
25.0000 mg | ORAL_CAPSULE | Freq: Once | ORAL | Status: AC
  Administered 2019-07-14: 25 mg via ORAL

## 2019-07-14 MED ORDER — DIPHENHYDRAMINE HCL 25 MG PO CAPS
ORAL_CAPSULE | ORAL | Status: AC
  Filled 2019-07-14: qty 1

## 2019-07-14 NOTE — Progress Notes (Signed)
[place]  [phone] Fax:([phone]) [phone]    ID: [place]i Ferber DOB: 31-May-1971  MR#: [id number]  FKC#:[id number]  Patient Care Team: Marda Stalker, PA-C as PCP - General (Family Medicine) Jerline Pain, MD as PCP - Cardiology (Cardiology) Magrinat, Virgie Dad, MD as Consulting Physician (Oncology) Loreta Ave, MD as Referring Physician (Internal Medicine) Donato Heinz, MD as Consulting Physician (Nephrology) Kyung Rudd, MD as Consulting Physician (Radiation Oncology) Jovita Kussmaul, MD as Consulting Physician (General Surgery) Larey Dresser, MD as Consulting Physician (Cardiology) Mauro Kaufmann, RN as Oncology Nurse Navigator Rockwell Germany, RN as Oncology Nurse Navigator OTHER MD:   CHIEF COMPLAINT: Triple positive breast cancer  CURRENT TREATMENT: Trastuzumab; anastrozole; to start Xgeva   HISTORY OF CURRENT ILLNESS: From the original intake note:  [place] Franzel palpated a lump on 01/15/2018. She felt soreness and shooting pain under her arm and in the right breast. She underwent bilateral diagnostic mammography with tomography and right breast ultrasonography at [facility] on 03/12/2018 showing: breast density category C. The is architectural distortion at the 11 o'clock position. There is also an oval mass in the right breast anterior depth. Examination of the right axilla showed enlarged lymph nodes. Ultrasonography showed a 4.6 cm mass in the right breast upper outer quadrant posterior depth. An additional 1.2 cm oval mass in the right breast 12 o'clock middle depth. The lymph nodes in the right axillary are highly suggestive of malignancy.   Accordingly on 03/16/2018 she proceeded to biopsy of the right breast area in question. The pathology from this procedure showed ([id number]): At both the 11 and 12 o'clock positions: Invasive ductal carcinoma grade II. Ductal carcinoma in situ, high grade, with necrosis and calcifications.  Prognostic indicators significant for: estrogen receptor, 90% positive and progesterone receptor, 40% positive, both with strong staining intensity. Proliferation marker Ki67 at 80%. HER2 amplified with ratios HER2/CEP17 SIGNALS 6.90 and average HER2 copies per cell 14.50  The patient's subsequent history is as detailed below.   INTERVAL HISTORY: [place] was seen today for follow-up and treatment of her triple positive breast cancer.  She continues on trastuzumab, which she receives every 28 days.  Tolerates this with no side effects that she is aware of.    Choua's last echocardiogram on 04/07/2019, showed an ejection fraction in the 55% - 60% range.  She has repeat echocardiogram scheduled for tomorrow with Dr. Haroldine Laws  She also continues on anastrozole. She takes gabapentin for hot flashes which helps, she uses coconut oil daily to help with this, she takes tramadol only if absolutely needed for significant arthralgias.  This typically happens once very couple of days.    [place] was ordered to start Delton See today for her hypercalcemia and bony involvement of her breast cancer.  She is scheduled to undergo dental extractions later this month.    REVIEW OF SYSTEMS: [place] notes her activity level is improving and she is getting more independent.  She says that the peripheral neuropathy is manageable.  The gabapentin also helps with this that she takes for her hot flashes.  She has no balance issues, other than when she exercises without her bra.  She feels like both of her arms are getting larger.  She is wearing sleeves.    [place] denies any new pain.  She is eating and drinking well.  She denies any unusual headaches, nausea, vomiting, bowel/bladder changes.  She has no cough, shortness of breath, chest pain, or palpitations.  A detailed ROS was  otherwise non contributory.    PAST MEDICAL HISTORY: Past Medical History:  Diagnosis Date  . Anxiety   . Breast cancer, left breast ([place]) 2000    Underwent lumpectomy, chemotherapy and radiation  . Facial paralysis/Bells palsy    left side  . Family history of lung cancer   . Family history of lymphoma   . Hypertension   . Neuropathy    IN FINGERS AND TOES   RECENT INFUSION OF CHEMO  . Personal history of breast cancer 05/06/2018  . Renal disorder    just after TIA acute kidney injury  . TIA (transient ischemic attack) 03/16/2017   [facility]   The patient has a previous history of left breast cancer diagnosed in 2000. She was seen by [facility] in [place], [place] under Dr. Loreta Ave. According to the patient, her left breast cancer was stage II with no lymph node involvement (likely T2N0). She had chemotherapy every 3 weeks for about 6 months. She did not takes any "red drugs." She also did not take anti-estrogens (likely ER/PR negative).   TIA in 2018. She saw a neurologist as a precaution. She denies any clotting issues.    PAST SURGICAL HISTORY: Past Surgical History:  Procedure Laterality Date  . BREAST LUMPECTOMY WITH AXILLARY LYMPH NODE BIOPSY Left 2000   biopsy  . BREAST SURGERY     partial mastectomy with lymph node removal  . MASTECTOMY MODIFIED RADICAL Right 08/26/2018   Procedure: MASTECTOMY MODIFIED RADICAL;  Surgeon: Jovita Kussmaul, MD;  Location: [facility];  Service: General;  Laterality: Right;  . MODIFIED MASTECTOMY Right 08/26/2018  . PORTACATH PLACEMENT Left 04/08/2018   Procedure: INSERTION PORT-A-CATH;  Surgeon: Jovita Kussmaul, MD;  Location: [facility];  Service: General;  Laterality: Left;  . TUBAL LIGATION Bilateral 2004  . WISDOM TOOTH EXTRACTION      FAMILY HISTORY Family History  Problem Relation Age of Onset  . Hypertension Mother   . Hypertension Father   . Diverticulosis Father   . Diabetes Father   . Lung cancer Father 82       hx smoking  . Hypertension Maternal Grandmother   . CAD Maternal Grandmother   . Alzheimer's disease Maternal Grandmother        died at 26  .  Hypertension Paternal Grandmother   . CAD Paternal Grandmother   . Stroke Paternal Grandfather   . Sickle cell trait Paternal Grandfather   . Pneumonia Maternal Aunt        died at a young adult  . Lymphoma Paternal Aunt 45   The patient's father is alive at age 4 and had a history of lung cancer. The patient's mother is alive at age 40. The patient has 1 brother and no sisters. She denies a family history of breast or ovarian cancer.     GYNECOLOGIC HISTORY:  No LMP recorded. (Menstrual status: Perimenopausal). Menarche: 48 years old Age at first live birth: 48 years old She is GXP3. She is no longer having periods, which were irregular and light. Her LMP was  January 2019. She is not having hot flashes. The patient used oral contraceptive from [phone] and the Depo-provera shot from [phone] with no complications. She never used HRT.    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 Her husband, [name], is a Administrator. The patient's son, Shea Stakes age 32, works at [employer] in [place]. The patient's daughters, Lilia Pro age 89, and Levonne Spiller age 45,  are students. The patient's adopted son, Vonna Kotyk age 63, is also a Ship broker.    ADVANCED DIRECTIVES:    HEALTH MAINTENANCE: Social History   Tobacco Use  . Smoking status: Never Smoker  . Smokeless tobacco: Never Used  Substance Use Topics  . Alcohol use: No  . Drug use: No     Colonoscopy: n/a  PAP: 2015  Bone density: never done   Allergies  Allergen Reactions  . Ace Inhibitors Swelling    Swelling of lips and face  . Lisinopril Swelling    Swelling of lips, face  . Amlodipine Swelling    Leg swelling  . Adhesive [Tape] Itching and Rash    Please use paper tape  . Latex Itching and Rash    Current Outpatient Medications  Medication Sig Dispense Refill  . anastrozole (ARIMIDEX) 1 MG tablet Take 1 tablet (1 mg total) by mouth daily. 90 tablet 4  . aspirin EC 81 MG tablet Take 81 mg by mouth  daily.    . carvedilol (COREG) 3.125 MG tablet Take 1 tablet (3.125 mg total) by mouth 2 (two) times daily with a meal. 60 tablet 6  . Cyanocobalamin (VITAMIN B12) 500 MCG TABS Take 500 mcg by mouth daily.     . ferrous sulfate 325 (65 FE) MG tablet Take 1 tablet (325 mg total) by mouth daily. 30 tablet 0  . gabapentin (NEURONTIN) 300 MG capsule Take 1 capsule (300 mg total) by mouth at bedtime. 90 capsule 4  . lidocaine-prilocaine (EMLA) cream Apply 1 application topically as needed.    Marland Kitchen losartan (COZAAR) 50 MG tablet Take 1 tablet (50 mg total) by mouth daily. 30 tablet 11  . Multiple Vitamin (MULITIVITAMIN WITH MINERALS) TABS Take 1 tablet by mouth daily.      Marland Kitchen spironolactone-hydrochlorothiazide (ALDACTAZIDE) 25-25 MG tablet TAKE 1 TABLET BY MOUTH EVERY DAY 30 tablet 2  . traMADol (ULTRAM) 50 MG tablet Take 1 tablet (50 mg total) by mouth every 6 (six) hours as needed. 80 tablet 0   No current facility-administered medications for this visit.     OBJECTIVE:  Vitals:   07/14/19 0857  BP: 130/60  Pulse: 65  Resp: 18  Temp: 97.8 F (36.6 C)  SpO2: 100%     Body mass index is 45.69 kg/m.   Wt Readings from Last 3 Encounters:  07/14/19 226 lb 3.2 oz (102.6 kg)  06/16/19 223 lb (101.2 kg)  05/19/19 224 lb 14.4 oz (102 kg)  ECOG FS:1 - Symptomatic but completely ambulatory GENERAL: Patient is a well appearing female in no acute distress HEENT:  Sclerae anicteric.  Oropharynx clear and moist. No ulcerations or evidence of oropharyngeal candidiasis. Neck is supple.  NODES:  No cervical, supraclavicular, or axillary lymphadenopathy palpated.  BREAST EXAM:  Deferred. LUNGS:  Clear to auscultation bilaterally.  No wheezes or rhonchi. HEART:  Regular rate and rhythm. No murmur appreciated. ABDOMEN:  Soft, nontender.  Positive, normoactive bowel sounds. No organomegaly palpated. MSK:  No focal spinal tenderness to palpation. Full range of motion bilaterally in the upper  extremities. EXTREMITIES:  Bilateral upper extremity swelling present SKIN:  Clear with no obvious rashes or skin changes. No nail dyscrasia. NEURO:  Nonfocal. Well oriented.  Appropriate affect.     LAB RESULTS:  CMP     Component Value Date/Time   NA 138 06/16/2019 0809   K 4.1 06/16/2019 0809   CL 105 06/16/2019 0809   CO2 25 06/16/2019 0809   GLUCOSE 103 (  H) 06/16/2019 0809   BUN 25 (H) 06/16/2019 0809   CREATININE 1.31 (H) 06/16/2019 0809   CREATININE 1.36 (H) 05/19/2019 0832   CALCIUM 11.0 (H) 06/16/2019 0809   CALCIUM 10.2 06/25/2018 1425   PROT 7.3 06/16/2019 0809   ALBUMIN 3.6 06/16/2019 0809   AST 19 06/16/2019 0809   AST 19 05/19/2019 0832   ALT 25 06/16/2019 0809   ALT 24 05/19/2019 0832   ALKPHOS 120 06/16/2019 0809   BILITOT 0.3 06/16/2019 0809   BILITOT 0.3 05/19/2019 0832   GFRNONAA 48 (L) 06/16/2019 0809   GFRNONAA 46 (L) 05/19/2019 0832   GFRAA 56 (L) 06/16/2019 0809   GFRAA 54 (L) 05/19/2019 0832    Lab Results  Component Value Date   TOTALPROTELP 7.0 02/25/2019     Lab Results  Component Value Date   KPAFRELGTCHN 76.7 (H) 02/25/2019   LAMBDASER 27.2 (H) 02/25/2019   KAPLAMBRATIO 2.82 (H) 02/25/2019    Lab Results  Component Value Date   WBC 4.3 07/14/2019   NEUTROABS 2.7 07/14/2019   HGB 11.2 (L) 07/14/2019   HCT 34.6 (L) 07/14/2019   MCV 84.2 07/14/2019   PLT 163 07/14/2019    '@LASTCHEMISTRY'$ @  No results found for: LABCA2  No components found for: VQMGQQ761  No results for input(s): INR in the last 168 hours.  No results found for: LABCA2  No results found for: CAN199  No results found for: PJK932  No results found for: IZT245  Lab Results  Component Value Date   CA2729 38.7 (H) 02/25/2019    No components found for: HGQUANT  No results found for: CEA1 / No results found for: CEA1   No results found for: AFPTUMOR  No results found for: [place]  No results found for: PSA1  Appointment on 07/14/2019   Component Date Value Ref Range Status  . WBC 07/14/2019 4.3  4.0 - 10.5 K/uL Final  . RBC 07/14/2019 4.11  3.87 - 5.11 MIL/uL Final  . Hemoglobin 07/14/2019 11.2* 12.0 - 15.0 g/dL Final  . HCT 07/14/2019 34.6* 36.0 - 46.0 % Final  . MCV 07/14/2019 84.2  80.0 - 100.0 fL Final  . MCH 07/14/2019 27.3  26.0 - 34.0 pg Final  . MCHC 07/14/2019 32.4  30.0 - 36.0 g/dL Final  . RDW 07/14/2019 14.7  11.5 - 15.5 % Final  . Platelets 07/14/2019 163  150 - 400 K/uL Final  . nRBC 07/14/2019 0.0  0.0 - 0.2 % Final  . Neutrophils Relative % 07/14/2019 63  % Final  . Neutro Abs 07/14/2019 2.7  1.7 - 7.7 K/uL Final  . Lymphocytes Relative 07/14/2019 19  % Final  . Lymphs Abs 07/14/2019 0.8  0.7 - 4.0 K/uL Final  . Monocytes Relative 07/14/2019 14  % Final  . Monocytes Absolute 07/14/2019 0.6  0.1 - 1.0 K/uL Final  . Eosinophils Relative 07/14/2019 2  % Final  . Eosinophils Absolute 07/14/2019 0.1  0.0 - 0.5 K/uL Final  . Basophils Relative 07/14/2019 1  % Final  . Basophils Absolute 07/14/2019 0.0  0.0 - 0.1 K/uL Final  . Immature Granulocytes 07/14/2019 1  % Final  . Abs Immature Granulocytes 07/14/2019 0.02  0.00 - 0.07 K/uL Final   Performed at [facility], [place] [address], [place], [place] [address]    (this displays the last labs from the last 3 days)  Lab Results  Component Value Date   TOTALPROTELP 7.0 02/25/2019   (this displays SPEP labs)  Lab Results  Component Value Date   KPAFRELGTCHN 76.7 (H) 02/25/2019   LAMBDASER 27.2 (H) 02/25/2019   KAPLAMBRATIO 2.82 (H) 02/25/2019   (kappa/lambda light chains)  No results found for: HGBA, HGBA2QUANT, HGBFQUANT, HGBSQUAN (Hemoglobinopathy evaluation)   No results found for: LDH  No results found for: IRON, TIBC, IRONPCTSAT (Iron and TIBC)  No results found for: FERRITIN  Urinalysis    Component Value Date/Time   COLORURINE STRAW (A) 03/17/2017 0600   APPEARANCEUR CLEAR (A) 03/17/2017 0600   LABSPEC  1.031 (H) 03/17/2017 0600   PHURINE 5.0 03/17/2017 0600   GLUCOSEU NEGATIVE 03/17/2017 0600   HGBUR NEGATIVE 03/17/2017 0600   BILIRUBINUR NEGATIVE 03/17/2017 0600   KETONESUR NEGATIVE 03/17/2017 0600   PROTEINUR NEGATIVE 03/17/2017 0600   UROBILINOGEN 0.2 11/29/2011 1656   NITRITE NEGATIVE 03/17/2017 0600   LEUKOCYTESUR NEGATIVE 03/17/2017 0600     STUDIES: No results found.  ELIGIBLE FOR AVAILABLE RESEARCH PROTOCOL: no   ASSESSMENT: 48 y.o. [place], [place] woman  (1) status post left lumpectomy in 2000 for a (?) T2N0 breast cancer,  (a) status post adjuvant chemotherapy  (b) status post adjuvant radiation  (c) did not receive antiestrogens  (2) genetics testing May 18, 2018 through the common Hereditary Cancers Panel + Myelodysplastic Syndrome/Leukemia Panel found no deleterious mutations in APC, ATM, AXIN2, BARD1, BLM, BMPR1A, BRCA1, BRCA2, BRIP1, CDH1, CDK4, CDKN2A (p14ARF), CDKN2A (p16INK4a), CEBPA, CHEK2, CTNNA1, DICER1, EPCAM*, GATA2, GREM1*, HRAS, KIT, MEN1, MLH1, MSH2, MSH3, MSH6, MUTYH, NBN, NF1, PALB2, PDGFRA, PMS2, POLD1, POLE, PTEN, RAD50, RAD51C, RAD51D, RUNX1, SDHB, SDHC, SDHD, SMAD4, SMARCA4, STK11, TERC, TERT, TP53, TSC1, TSC2, VHL The following genes were evaluated for sequence changes only: HOXB13*, NTHL1*, SDHA  (a)  3 variants of uncertain significance were identified in the genes BARD1 c.764A>G (p.Asn255Ser), BRIP1 c.2563C>T (p.Arg855Cys), and PALB2 c.3103A>T (p.Ile1035Phe).   (3) status post right breast biopsy 03/16/2018 for a clinical mT2-3 N2, anatomic stage III invasive ductal carcinoma, grade 2, triple positive, with an MIB-1 of 80%.  (a) bone scan and chest CT scan negative for metastases except for a T6 lytic lesion of concern  (b) thoracic spine MRI was ordered May 2019 but never performed.  (4) neoadjuvant chemotherapy consisting of carboplatin, docetaxel, trastuzumab and Pertuzumab every 3 weeks x 6 starting 04/16/2018  (a) pertuzumab held beginning  with cycle 2 because of diarrhea  (5) trastuzumab to be continued Q4w indefinitely given diagnosis of stage IV disease below  (a) echocardiogram on 06/29/2018 showed an ejection fraction in the 65-70% range  (b) echocardiogram 11/19/2018 showed an ejection fraction in the 60-65% range  (c) echo 04/07/2019 shows an ejection fraction in the 55-60% range  (d) echo 07/15/2019  (6) right mastectomy and sentinel lymph node sampling on 08/26/2018 found a residual 2.5cm invasive ductal carcinoma, with 2 of 5 sentinel lymph nodes positive, ypT2, ypN1a; margins were clear  (7) adjuvant radiation 09/30/2018 - 11/16/2018 Site/dose:   The patient initially received a dose of 50.4 Gy in 28 fractions to the right chest wall and supraclavicular region. This was delivered using a 3-D conformal, 4 field technique. The patient then received a boost to the mastectomy scar. This delivered an additional 10 Gy in 5 fractions using an en face electron field. The total dose was 60.4   (8) anastrozole started 12/03/2018  (a) Dows on 10/01/2018 was 64.3, and estradiol 7.0, consistent with menopause  (b) referral to pelvic floor rehabilitation 12/03/2018  METASTATIC DISEASE: March 2020 (9) CT of neck and CT angiography  of the chest 02/04/2019 finds the T6 lytic lesion noted May 2019 (see #3 above) is now sclerotic; a sclerotic T3 lesion is stable; there is a new lytic lesion in the manubrium  (a) PET scan 03/12/2019 shows manubrium, T3 and T5 metastases, no visceral disease  (b) biopsy of manubrial lesion planned but not done secondary to pandemic  (10) SRS to spine, palliative treatment to sternum, from 05/13/2019 through 05/26/2019: 1. Chest_sternum // 40 Gy in 10 fractions  2. Thoracic Spine (T3) // 18 Gy in 1 fraction  3. Thoracic Spine (T5) // 18 Gy in 1 fraction   PLAN: [place] is doing well today.  She continues on Anastrozole and Trastuzumab with good tolerance.  She will proceed with these today.  [place] and I  reviewed her hypercalcemia.  Her calcium is 11.  She has bone lesions and is due to start Xgeva today.  Due to her upcoming dental extractions, we will need to hold off on this.  Her kidney function is higher, and she has increased her fluid intake.  I reviewed that with Dr. Jana Hakim who wants to continue to monitor it.  I recommended that she avoid extra calcium in her supplements/meals, and that she continue with drinking plenty of water.    [place] and I reviewed the [place].  I gave her detailed information about this in her AVS and she is going to show her dentist who is doing the extractions.    [place] will return in 4 weeks for labs, f/u and her next treatment.  She was recommended to continue with the appropriate pandemic precautions. She knows to call for any questions that may arise between now and her next appointment.  We are happy to see her sooner if needed.  A total of (30) minutes of face-to-face time was spent with this patient with greater than 50% of that time in counseling and care-coordination.   Wilber Bihari, NP  07/14/19 9:28 AM Medical Oncology and Hematology [facility] [place], [place] [address] Tel. [phone]    Fax. [phone]

## 2019-07-14 NOTE — Patient Instructions (Signed)
[facility] Discharge Instructions for Patients Receiving Chemotherapy  Today you received the following chemotherapy agents Herceptin  To help prevent nausea and vomiting after your treatment, we encourage you to take your nausea medication as directed   If you develop nausea and vomiting that is not controlled by your nausea medication, call the clinic.   BELOW ARE SYMPTOMS THAT SHOULD BE REPORTED IMMEDIATELY:  *FEVER GREATER THAN 100.5 F  *CHILLS WITH OR WITHOUT FEVER  NAUSEA AND VOMITING THAT IS NOT CONTROLLED WITH YOUR NAUSEA MEDICATION  *UNUSUAL SHORTNESS OF BREATH  *UNUSUAL BRUISING OR BLEEDING  TENDERNESS IN MOUTH AND THROAT WITH OR WITHOUT PRESENCE OF ULCERS  *URINARY PROBLEMS  *BOWEL PROBLEMS  UNUSUAL RASH Items with * indicate a potential emergency and should be followed up as soon as possible.  Feel free to call the clinic should you have any questions or concerns. The clinic phone number is [phone].  Please show the CHEMO ALERT CARD at check-in to the Emergency Department and triage nurse.   

## 2019-07-14 NOTE — Patient Instructions (Signed)
Hypercalcemia Hypercalcemia is when the level of calcium in a person's blood is above normal. The body needs calcium to make bones and keep them strong. Calcium also helps the muscles, nerves, brain, and heart work the way they should. Most of the calcium in the body is in the bones. There is also some calcium in the blood. Hypercalcemia can happen when calcium comes out of the bones, or when the kidneys are not able to remove calcium from the blood. Hypercalcemia can be mild or severe. What are the causes? There are many possible causes of hypercalcemia. Common causes of this condition include:  Hyperparathyroidism. This is a condition in which the body produces too much parathyroid hormone. There are four parathyroid glands in your neck. These glands produce a chemical messenger (hormone) that helps the body absorb calcium from foods and helps your bones release calcium.  Certain kinds of cancer. Less common causes of hypercalcemia include:  Getting too much calcium or vitamin D from your diet.  Kidney failure.  Hyperthyroidism.  Severe dehydration.  Being on bed rest or being inactive for a long time.  Certain medicines.  Infections. What increases the risk? You are more likely to develop this condition if you:  Are female.  Are 48 years of age or older.  Have a family history of hypercalcemia. What are the signs or symptoms? Mild hypercalcemia that starts slowly may not cause symptoms. Severe, sudden hypercalcemia is more likely to cause symptoms, such as:  Being more thirsty than usual.  Needing to urinate more often than usual.  Abdominal pain.  Nausea and vomiting.  Constipation.  Muscle pain, twitching, or weakness.  Feeling very tired. How is this diagnosed?  Hypercalcemia is usually diagnosed with a blood test. You may also have tests to help determine what is causing this condition, such as imaging tests and more blood tests. How is this  treated? Treatment for hypercalcemia depends on the cause. Treatment may include:  Receiving fluids through an IV.  Medicines that: ? Keep calcium levels steady after receiving fluids (loop diuretics). ? Keep calcium in your bones (bisphosphonates). ? Lower the calcium level in your blood.  Surgery to remove overactive parathyroid glands.  A procedure that filters your blood to correct calcium levels (hemodialysis). Follow these instructions at home:   Take over-the-counter and prescription medicines only as told by your health care provider.  Follow instructions from your health care provider about eating or drinking restrictions.  Drink enough fluid to keep your urine pale yellow.  Stay active. Weight-bearing exercise helps to keep calcium in your bones. Follow instructions from your health care provider about what type and level of exercise is safe for you.  Keep all follow-up visits as told by your health care provider. This is important. Contact a health care provider if you have:  A fever.  A heartbeat that is irregular or very fast.  Changes in mood, memory, or personality. Get help right away if you:  Have severe abdominal pain.  Have chest pain.  Have trouble breathing.  Become very confused and sleepy.  Lose consciousness. Summary  Hypercalcemia is when the level of calcium in a person's blood is above normal. The body needs calcium to make bones and keep them strong. Calcium also helps the muscles, nerves, brain, and heart work the way they should.  There are many possible causes of hypercalcemia, and treatment depends on the cause.  Take over-the-counter and prescription medicines only as told by your health care  provider.  Follow instructions from your health care provider about eating or drinking restrictions. This information is not intended to replace advice given to you by your health care provider. Make sure you discuss any questions you have with  your health care provider. Document Released: 02/01/2005 Document Revised: 12/15/2018 Document Reviewed: 08/24/2018 Elsevier Patient Education  [place]. Denosumab injection What is this medicine? DENOSUMAB (den oh sue mab) slows bone breakdown. Prolia is used to treat osteoporosis in women after menopause and in men, and in people who are taking corticosteroids for 6 months or more. Delton See is used to treat a high calcium level due to cancer and to prevent bone fractures and other bone problems caused by multiple myeloma or cancer bone metastases. Delton See is also used to treat giant cell tumor of the bone. This medicine may be used for other purposes; ask your health care provider or pharmacist if you have questions. COMMON BRAND NAME(S): Prolia, XGEVA What should I tell my health care provider before I take this medicine? They need to know if you have any of these conditions:  dental disease  having surgery or tooth extraction  infection  kidney disease  low levels of calcium or Vitamin D in the blood  malnutrition  on hemodialysis  skin conditions or sensitivity  thyroid or parathyroid disease  an unusual reaction to denosumab, other medicines, foods, dyes, or preservatives  pregnant or trying to get pregnant  breast-feeding How should I use this medicine? This medicine is for injection under the skin. It is given by a health care professional in a hospital or clinic setting. A special MedGuide will be given to you before each treatment. Be sure to read this information carefully each time. For Prolia, talk to your pediatrician regarding the use of this medicine in children. Special care may be needed. For Delton See, talk to your pediatrician regarding the use of this medicine in children. While this drug may be prescribed for children as young as 13 years for selected conditions, precautions do apply. Overdosage: If you think you have taken too much of this medicine  contact a poison control center or emergency room at once. NOTE: This medicine is only for you. Do not share this medicine with others. What if I miss a dose? It is important not to miss your dose. Call your doctor or health care professional if you are unable to keep an appointment. What may interact with this medicine? Do not take this medicine with any of the following medications:  other medicines containing denosumab This medicine may also interact with the following medications:  medicines that lower your chance of fighting infection  steroid medicines like prednisone or cortisone This list may not describe all possible interactions. Give your health care provider a list of all the medicines, herbs, non-prescription drugs, or dietary supplements you use. Also tell them if you smoke, drink alcohol, or use illegal drugs. Some items may interact with your medicine. What should I watch for while using this medicine? Visit your doctor or health care professional for regular checks on your progress. Your doctor or health care professional may order blood tests and other tests to see how you are doing. Call your doctor or health care professional for advice if you get a fever, chills or sore throat, or other symptoms of a cold or flu. Do not treat yourself. This drug may decrease your body's ability to fight infection. Try to avoid being around people who are sick. You should  make sure you get enough calcium and vitamin D while you are taking this medicine, unless your doctor tells you not to. Discuss the foods you eat and the vitamins you take with your health care professional. See your dentist regularly. Brush and floss your teeth as directed. Before you have any dental work done, tell your dentist you are receiving this medicine. Do not become pregnant while taking this medicine or for 5 months after stopping it. Talk with your doctor or health care professional about your birth control options  while taking this medicine. Women should inform their doctor if they wish to become pregnant or think they might be pregnant. There is a potential for serious side effects to an unborn child. Talk to your health care professional or pharmacist for more information. What side effects may I notice from receiving this medicine? Side effects that you should report to your doctor or health care professional as soon as possible:  allergic reactions like skin rash, itching or hives, swelling of the face, lips, or tongue  bone pain  breathing problems  dizziness  jaw pain, especially after dental work  redness, blistering, peeling of the skin  signs and symptoms of infection like fever or chills; cough; sore throat; pain or trouble passing urine  signs of low calcium like fast heartbeat, muscle cramps or muscle pain; pain, tingling, numbness in the hands or feet; seizures  unusual bleeding or bruising  unusually weak or tired Side effects that usually do not require medical attention (report to your doctor or health care professional if they continue or are bothersome):  constipation  diarrhea  headache  joint pain  loss of appetite  muscle pain  runny nose  tiredness  upset stomach This list may not describe all possible side effects. Call your doctor for medical advice about side effects. You may report side effects to FDA at 1-800-FDA-1088. Where should I keep my medicine? This medicine is only given in a clinic, doctor's office, or other health care setting and will not be stored at home. NOTE: This sheet is a summary. It may not cover all possible information. If you have questions about this medicine, talk to your doctor, pharmacist, or health care provider.  2020 Elsevier/Gold Standard (2018-03-27 16:10:44)

## 2019-07-14 NOTE — Telephone Encounter (Signed)
[facility] will contact patient about refferal, no new orders or scheduled messages during time of check out.

## 2019-07-15 ENCOUNTER — Encounter (HOSPITAL_COMMUNITY): Payer: Self-pay | Admitting: Internal Medicine

## 2019-07-15 ENCOUNTER — Ambulatory Visit (HOSPITAL_BASED_OUTPATIENT_CLINIC_OR_DEPARTMENT_OTHER)
Admission: RE | Admit: 2019-07-15 | Discharge: 2019-07-15 | Disposition: A | Discharge status: Home / Self Care | Payer: BC Managed Care – PPO | Source: Ambulatory Visit | Attending: Internal Medicine | Admitting: Internal Medicine

## 2019-07-15 ENCOUNTER — Ambulatory Visit (HOSPITAL_COMMUNITY)
Admission: RE | Admit: 2019-07-15 | Discharge: 2019-07-15 | Disposition: A | Discharge status: Home / Self Care | Payer: BC Managed Care – PPO | Source: Ambulatory Visit | Attending: Internal Medicine | Admitting: Internal Medicine

## 2019-07-15 VITALS — BP 141/77 | HR 71 | Wt 225.0 lb

## 2019-07-15 DIAGNOSIS — Z801 Family history of malignant neoplasm of trachea, bronchus and lung: Secondary | ICD-10-CM | POA: Insufficient documentation

## 2019-07-15 DIAGNOSIS — E669 Obesity, unspecified: Secondary | ICD-10-CM | POA: Diagnosis not present

## 2019-07-15 DIAGNOSIS — Z170 Estrogen receptor positive status [ER+]: Secondary | ICD-10-CM | POA: Diagnosis not present

## 2019-07-15 DIAGNOSIS — C50411 Malignant neoplasm of upper-outer quadrant of right female breast: Secondary | ICD-10-CM

## 2019-07-15 DIAGNOSIS — Z888 Allergy status to other drugs, medicaments and biological substances status: Secondary | ICD-10-CM | POA: Diagnosis not present

## 2019-07-15 DIAGNOSIS — Z8673 Personal history of transient ischemic attack (TIA), and cerebral infarction without residual deficits: Secondary | ICD-10-CM | POA: Diagnosis not present

## 2019-07-15 DIAGNOSIS — I10 Essential (primary) hypertension: Secondary | ICD-10-CM | POA: Insufficient documentation

## 2019-07-15 DIAGNOSIS — Z6841 Body Mass Index (BMI) 40.0 and over, adult: Secondary | ICD-10-CM | POA: Diagnosis not present

## 2019-07-15 DIAGNOSIS — Z91040 Latex allergy status: Secondary | ICD-10-CM | POA: Diagnosis not present

## 2019-07-15 DIAGNOSIS — Z9221 Personal history of antineoplastic chemotherapy: Secondary | ICD-10-CM | POA: Insufficient documentation

## 2019-07-15 DIAGNOSIS — Z833 Family history of diabetes mellitus: Secondary | ICD-10-CM | POA: Diagnosis not present

## 2019-07-15 DIAGNOSIS — Z7982 Long term (current) use of aspirin: Secondary | ICD-10-CM | POA: Insufficient documentation

## 2019-07-15 DIAGNOSIS — Z79811 Long term (current) use of aromatase inhibitors: Secondary | ICD-10-CM | POA: Insufficient documentation

## 2019-07-15 DIAGNOSIS — Z79899 Other long term (current) drug therapy: Secondary | ICD-10-CM | POA: Diagnosis not present

## 2019-07-15 DIAGNOSIS — Z923 Personal history of irradiation: Secondary | ICD-10-CM | POA: Diagnosis not present

## 2019-07-15 DIAGNOSIS — Z8249 Family history of ischemic heart disease and other diseases of the circulatory system: Secondary | ICD-10-CM | POA: Diagnosis not present

## 2019-07-15 DIAGNOSIS — Z9011 Acquired absence of right breast and nipple: Secondary | ICD-10-CM | POA: Diagnosis not present

## 2019-07-15 DIAGNOSIS — F419 Anxiety disorder, unspecified: Secondary | ICD-10-CM | POA: Insufficient documentation

## 2019-07-15 NOTE — Patient Instructions (Signed)
Your physician has requested that you have an echocardiogram. Echocardiography is a painless test that uses sound waves to create images of your heart. It provides your doctor with information about the size and shape of your heart and how well your heart's chambers and valves are working. This procedure takes approximately one hour. There are no restrictions for this procedure.  Your physician recommends that you schedule a follow-up appointment in: 3 months.  At the [facility], you and your health needs are our priority. As part of our continuing mission to provide you with exceptional heart care, we have created designated Provider Care Teams. These Care Teams include your primary Cardiologist (physician) and Advanced Practice Providers (APPs- Physician Assistants and Nurse Practitioners) who all work together to provide you with the care you need, when you need it.   You may see any of the following providers on your designated Care Team at your next follow up: Marland Kitchen Dr Glori Bickers . Dr Loralie Champagne . Darrick Grinder, NP   Please be sure to bring in all your medications bottles to every appointment.

## 2019-07-15 NOTE — Addendum Note (Signed)
Encounter addended by: Jovita Kussmaul, RN on: 07/15/2019 2:38 PM  Actions taken: Order list changed, Diagnosis association updated, Clinical Note Signed

## 2019-07-15 NOTE — Progress Notes (Signed)
  Echocardiogram 2D Echocardiogram has been performed.  [name] [name] 07/15/2019, 1:42 PM

## 2019-07-15 NOTE — Progress Notes (Signed)
Cardio-Oncology Clinic Note   Date:  07/15/2019   ID:  [name] [name], DOB [birth year], MRN [id number]  Location: Home  Provider location: [facility] Type of Visit: Established patient  PCP:  Marda Stalker, PA-C  Cardiologist:  Candee Furbish, MD Primary HF: [place]  Chief Complaint: Heart Failure follow-up   History of Present Illness:  HPI:  [name] [name] is 48 y.o. female with h/o HTN, TIA, anxiety and left breast cancer (treated in 2000 at [facility]) who was diagnosed with triple-positive R breast cancer in 4/19. Referred by Dr. Jana Hakim for enrollment into the Cardio-Oncology program.  Cancer history/treatment plan:  (1) status post left lumpectomy in 2000 for a (?) T2N0 breast cancer, (a) status post adjuvant chemotherapy including adriamycin (b) status post adjuvant radiation (c) did not receive antiestrogens  (2) status post right breast biopsy 03/16/2018 for a clinical mT2-3 N2, anatomic stage III invasive ductal carcinoma, grade 2, triple positive, with an MIB-1 of 80%. (a) bone scan and chest CT scan negative for metastases except for a thoracic spine lytic lesion of concern  (3) neoadjuvant chemotherapy consisting of carboplatin, docetaxel, trastuzumab and Pertuzumab every 3 weeks x 6 starting 04/16/2018 (a) pertuzumab held beginning with cycle 2 because of diarrhea  (4) trastuzumab to be continued indefinitely  (5) 9/25 s/p surgical resection  (6) adjuvant radiation to the right breast completed   (7) antiestrogens to start at the completion of local treatment  She presents today for routine f/u.PET scan on 03/11/2019 showing: Hypermetabolic bony lytic lesions of the right upper sternal manubrium; the right T3 posterior elements; and of the T5 spinous process, compatible with bony malignancyDoing well. Will be on Herceptin life-long. Has gone to a plant-based diet. Her  husband bought her a TM and she is walking on it regularly. SBP 130s-140s. No SOB, orthopnea or PND. Occasional swelling in LLE  Echo today EF 60-65% GLS -18.7% Personally reviewed    Echo 04/07/19 EF 60-65% GLS -17.7%   Echo 12/19 EF 60% GLS -23%  Echo 06/29/18 EF 65-70%  GLS -20.6% LS 9.5 cm/s  Echo 4/19 EF 60-65% mild LVH Grade II DD     [name] Quinton denies symptoms worrisome for COVID 19.   Past Medical History:  Diagnosis Date  . Anxiety   . Breast cancer, left breast ([place]) 2000   Underwent lumpectomy, chemotherapy and radiation  . Facial paralysis/Bells palsy    left side  . Family history of lung cancer   . Family history of lymphoma   . Hypertension   . Neuropathy    IN FINGERS AND TOES   RECENT INFUSION OF CHEMO  . Personal history of breast cancer 05/06/2018  . Renal disorder    just after TIA acute kidney injury  . TIA (transient ischemic attack) 03/16/2017   [facility]   Past Surgical History:  Procedure Laterality Date  . BREAST LUMPECTOMY WITH AXILLARY LYMPH NODE BIOPSY Left 2000   biopsy  . BREAST SURGERY     partial mastectomy with lymph node removal  . MASTECTOMY MODIFIED RADICAL Right 08/26/2018   Procedure: MASTECTOMY MODIFIED RADICAL;  Surgeon: Jovita Kussmaul, MD;  Location: [place];  Service: General;  Laterality: Right;  . MODIFIED MASTECTOMY Right 08/26/2018  . PORTACATH PLACEMENT Left 04/08/2018   Procedure: INSERTION PORT-A-CATH;  Surgeon: Jovita Kussmaul, MD;  Location: [place];  Service: General;  Laterality: Left;  . TUBAL LIGATION Bilateral 2004  . WISDOM TOOTH EXTRACTION  Current Outpatient Medications  Medication Sig Dispense Refill  . anastrozole (ARIMIDEX) 1 MG tablet Take 1 tablet (1 mg total) by mouth daily. 90 tablet 4  . aspirin EC 81 MG tablet Take 81 mg by mouth daily.    . carvedilol (COREG) 3.125 MG tablet Take 1 tablet (3.125 mg total) by mouth 2 (two) times daily with a meal. 60 tablet 6  . Cyanocobalamin (VITAMIN B12)  500 MCG TABS Take 500 mcg by mouth daily.     . ferrous sulfate 325 (65 FE) MG tablet Take 1 tablet (325 mg total) by mouth daily. 30 tablet 0  . gabapentin (NEURONTIN) 300 MG capsule Take 1 capsule (300 mg total) by mouth at bedtime. 90 capsule 4  . lidocaine-prilocaine (EMLA) cream Apply 1 application topically as needed.    Marland Kitchen losartan (COZAAR) 50 MG tablet Take 1 tablet (50 mg total) by mouth daily. 30 tablet 11  . Multiple Vitamin (MULITIVITAMIN WITH MINERALS) TABS Take 1 tablet by mouth daily.      Marland Kitchen spironolactone-hydrochlorothiazide (ALDACTAZIDE) 25-25 MG tablet TAKE 1 TABLET BY MOUTH EVERY DAY 30 tablet 2  . traMADol (ULTRAM) 50 MG tablet Take 1 tablet (50 mg total) by mouth every 6 (six) hours as needed. 80 tablet 0   No current facility-administered medications for this encounter.     Allergies:   Ace inhibitors, Lisinopril, Amlodipine, Adhesive [tape], and Latex   Social History:  The patient  reports that she has never smoked. She has never used smokeless tobacco. She reports that she does not drink alcohol or use drugs.   Family History:  The patient's family history includes Alzheimer's disease in her maternal grandmother; CAD in her maternal grandmother and paternal grandmother; Diabetes in her father; Diverticulosis in her father; Hypertension in her father, maternal grandmother, mother, and paternal grandmother; Lung cancer (age of onset: 30) in her father; Lymphoma (age of onset: 18) in her paternal aunt; Pneumonia in her maternal aunt; Sickle cell trait in her paternal grandfather; Stroke in her paternal grandfather.   ROS:  Please see the history of present illness.   All other systems are personally reviewed and negative.   Exam:  General:  Well appearing. No resp difficulty HEENT: normal Neck: supple. no JVD. Carotids 2+ bilat; no bruits. No lymphadenopathy or thryomegaly appreciated. Cor: PMI nondisplaced. Regular rate & rhythm. No rubs, gallops or murmurs. Lungs:  clear Abdomen: obese soft, nontender, nondistended. No hepatosplenomegaly. No bruits or masses. Good bowel sounds. Extremities: no cyanosis, clubbing, rash, edema Neuro: alert & orientedx3, cranial nerves grossly intact. moves all 4 extremities w/o difficulty. Affect pleasant   Recent Labs: 07/14/2019: ALT 27; BUN 33; Creatinine, Ser 1.41; Hemoglobin 11.2; Platelets 163; Potassium 4.1; Sodium 137  Personally reviewed   Wt Readings from Last 3 Encounters:  07/15/19 102.1 kg (225 lb)  07/14/19 102.6 kg (226 lb 3.2 oz)  06/16/19 101.2 kg (223 lb)      ASSESSMENT AND PLAN:  1. Right Breast Cancer diagnosed 4/19 stage IV - triple positive. Found to have Stage IV. Disease in 4/20. - I reviewed echos personally. EF and Doppler parameters stable. No HF on exam.  Continue lifelong Herceptin.  - Echos every 3 months. When we get to 2 year mark will switch echos to every 6 months in 5/21  2. HTN - now on losartan and carvedilol.  - Blood pressure minimally elevated - Continue carvedilol, spiro and losartan. Adjust as needed   Signed, Glori Bickers, MD  07/15/2019 2:30  PM  Advanced [facility] Failure [address] [place] [id number] [phone] (office) [phone] (fax)

## 2019-07-16 ENCOUNTER — Telehealth: Payer: Self-pay | Admitting: *Deleted

## 2019-07-16 NOTE — Telephone Encounter (Signed)
Received vm call from pt stating that she is struggling today with her energy/concentration & asking if this is normal & if there is something she can do to help.  Called pt back & she reports that she is eating well, sleeping OK, resting now, & exercising some. She took a walk this am.  She is taking her iron & MVI. Reviewed labs from 8/12 visit & no big alerts.  Ca+ was elevated & pt to start Xgeva but holding until she finishes some dental work. Encouraged to cont doing what she is doing & to call if symptoms worse.  She denies any other symptoms. Informed that a message would be left with DR Magrinat & to call back on Monday if she hasn't heard back from him or his nurse.

## 2019-07-19 ENCOUNTER — Telehealth: Payer: Self-pay | Admitting: *Deleted

## 2019-07-19 ENCOUNTER — Ambulatory Visit: Payer: BC Managed Care – PPO | Admitting: Rehabilitation

## 2019-07-19 ENCOUNTER — Other Ambulatory Visit: Payer: Self-pay | Admitting: Oncology

## 2019-07-19 NOTE — Telephone Encounter (Signed)
This RN called pt per her concerns from last week regarding struggling with concentration to inform her per MD review of recommendation for " Remember " trail offered per our research department.  Message left on verified VM per above- if pt is interested - referral will be made.

## 2019-07-22 DIAGNOSIS — F4325 Adjustment disorder with mixed disturbance of emotions and conduct: Secondary | ICD-10-CM | POA: Diagnosis not present

## 2019-07-22 NOTE — Progress Notes (Signed)
  Radiation Oncology         [phone]) [phone] ________________________________  Name: [name] [name] MRN: [id number]  Date: 05/06/2019  DOB: [birth year]  SIMULATION AND TREATMENT PLANNING NOTE  DIAGNOSIS:     ICD-10-CM   1. Bone metastases ([place])  C79.51     Site:  Spine at the T3 and T5 level. The patient will also receive XRT to a lesion within the sternum.  NARRATIVE:  The patient was brought to the [facility].  Identity was confirmed.  All relevant records and images related to the planned course of therapy were reviewed.   Written consent to proceed with treatment was confirmed which was freely given after reviewing the details related to the planned course of therapy had been reviewed with the patient.  Then, the patient was set-up in a stable reproducible supine position for radiation therapy.  CT images were obtained.  Surface markings were placed.    Medically necessary complex treatment device(s) for immobilization:  1.  customized body-fix device, 2.  customized accuform device.   The CT images were loaded into the planning software.  Then the target and avoidance structures were contoured.  Treatment planning then occurred.  The radiation prescription was entered and confirmed.   The patient will receive a course of stereotactic radiosurgery to the spine. I am therefore requesting a IMRT techniquedue to the closeproximity of critaladjacent normal structures including the spinal cord and esophagus, which is medically necessary. The target volume has been contoured in addition to the spinal cord and surrounding critical at risk organs.  Dose volume histograms of each of these structures will be carefully reviewed as part of the IMRT technique.   PLAN:  The patient will receive 18 Gy in 1 fraction to each of the T-spine lesions (T3 and T5). The sternal lesion will receive 40 Gy in 10 fractions.  ________________________________   Jodelle Gross, MD, PhD

## 2019-08-03 ENCOUNTER — Telehealth: Payer: Self-pay | Admitting: *Deleted

## 2019-08-03 NOTE — Telephone Encounter (Signed)
This RN attempted to return VM left by pt stating onset of cold like symptoms - Nasal drainage- causing clearing of throat ( cough ), Mild sore throat Took tylenol to avoid fever  " just need to know if I need to be concerned with these symptoms "  Per return call - obtained VM stating mailbox is full.

## 2019-08-11 ENCOUNTER — Inpatient Hospital Stay: Payer: BC Managed Care – PPO | Admitting: Adult Health

## 2019-08-11 ENCOUNTER — Inpatient Hospital Stay: Payer: BC Managed Care – PPO

## 2019-08-11 ENCOUNTER — Other Ambulatory Visit: Payer: Self-pay

## 2019-08-11 ENCOUNTER — Inpatient Hospital Stay: Payer: BC Managed Care – PPO | Attending: Oncology

## 2019-08-11 ENCOUNTER — Encounter: Payer: Self-pay | Admitting: Adult Health

## 2019-08-11 VITALS — BP 121/64 | HR 74 | Temp 98.3°F | Resp 17 | Ht 59.0 in | Wt 223.6 lb

## 2019-08-11 DIAGNOSIS — Z170 Estrogen receptor positive status [ER+]: Secondary | ICD-10-CM

## 2019-08-11 DIAGNOSIS — Z79899 Other long term (current) drug therapy: Secondary | ICD-10-CM | POA: Diagnosis not present

## 2019-08-11 DIAGNOSIS — C50411 Malignant neoplasm of upper-outer quadrant of right female breast: Secondary | ICD-10-CM

## 2019-08-11 DIAGNOSIS — I10 Essential (primary) hypertension: Secondary | ICD-10-CM | POA: Insufficient documentation

## 2019-08-11 DIAGNOSIS — Z79811 Long term (current) use of aromatase inhibitors: Secondary | ICD-10-CM | POA: Diagnosis not present

## 2019-08-11 DIAGNOSIS — R509 Fever, unspecified: Secondary | ICD-10-CM | POA: Insufficient documentation

## 2019-08-11 DIAGNOSIS — Z8673 Personal history of transient ischemic attack (TIA), and cerebral infarction without residual deficits: Secondary | ICD-10-CM | POA: Insufficient documentation

## 2019-08-11 DIAGNOSIS — C50811 Malignant neoplasm of overlapping sites of right female breast: Secondary | ICD-10-CM | POA: Diagnosis not present

## 2019-08-11 DIAGNOSIS — C7951 Secondary malignant neoplasm of bone: Secondary | ICD-10-CM | POA: Diagnosis not present

## 2019-08-11 DIAGNOSIS — Z95828 Presence of other vascular implants and grafts: Secondary | ICD-10-CM

## 2019-08-11 DIAGNOSIS — Z5112 Encounter for antineoplastic immunotherapy: Secondary | ICD-10-CM | POA: Insufficient documentation

## 2019-08-11 LAB — CBC WITH DIFFERENTIAL/PLATELET
Basophils Absolute: 0 10*3/uL (ref 0.0–0.1)
Basophils Relative: 1 %
Eosinophils Absolute: 0.1 10*3/uL (ref 0.0–0.5)
Eosinophils Relative: 2 %
HCT: 34.3 % — ABNORMAL LOW (ref 36.0–46.0)
Hemoglobin: 10.9 g/dL — ABNORMAL LOW (ref 12.0–15.0)
Lymphocytes Relative: 22 %
Lymphs Abs: 1.1 10*3/uL (ref 0.7–4.0)
MCH: 27.1 pg (ref 26.0–34.0)
MCHC: 31.8 g/dL (ref 30.0–36.0)
MCV: 85.3 fL (ref 80.0–100.0)
Monocytes Absolute: 0.6 10*3/uL (ref 0.1–1.0)
Monocytes Relative: 12 %
Neutro Abs: 3.4 10*3/uL (ref 1.7–7.7)
Neutrophils Relative %: 64 %
Platelets: 198 10*3/uL (ref 150–400)
RBC: 4.02 MIL/uL (ref 3.87–5.11)
RDW: 14.9 % (ref 11.5–15.5)
WBC: 5.3 10*3/uL (ref 4.0–10.5)
nRBC: 0 % (ref 0.0–0.2)

## 2019-08-11 LAB — COMPREHENSIVE METABOLIC PANEL
ALT: 32 U/L (ref 0–44)
AST: 20 U/L (ref 15–41)
Albumin: 3.7 g/dL (ref 3.5–5.0)
Alkaline Phosphatase: 119 U/L (ref 38–126)
Anion gap: 9 (ref 5–15)
BUN: 26 mg/dL — ABNORMAL HIGH (ref 6–20)
CO2: 25 mmol/L (ref 22–32)
Calcium: 11.3 mg/dL — ABNORMAL HIGH (ref 8.9–10.3)
Chloride: 107 mmol/L (ref 98–111)
Creatinine, Ser: 1.48 mg/dL — ABNORMAL HIGH (ref 0.44–1.00)
GFR calc Af Amer: 48 mL/min — ABNORMAL LOW (ref >60)
GFR calc non Af Amer: 42 mL/min — ABNORMAL LOW (ref >60)
Glucose, Bld: 110 mg/dL — ABNORMAL HIGH (ref 70–99)
Potassium: 4.2 mmol/L (ref 3.5–5.1)
Sodium: 141 mmol/L (ref 135–145)
Total Bilirubin: 0.3 mg/dL (ref 0.3–1.2)
Total Protein: 7.6 g/dL (ref 6.5–8.1)

## 2019-08-11 MED ORDER — SODIUM CHLORIDE 0.9 % IV SOLN
Freq: Once | INTRAVENOUS | Status: AC
  Administered 2019-08-11: 10:00:00 via INTRAVENOUS
  Filled 2019-08-11: qty 250

## 2019-08-11 MED ORDER — SODIUM CHLORIDE 0.9% FLUSH
10.0000 mL | INTRAVENOUS | Status: DC | PRN
  Administered 2019-08-11: 10 mL
  Filled 2019-08-11: qty 10

## 2019-08-11 MED ORDER — DIPHENHYDRAMINE HCL 25 MG PO CAPS
ORAL_CAPSULE | ORAL | Status: AC
  Filled 2019-08-11: qty 1

## 2019-08-11 MED ORDER — DIPHENHYDRAMINE HCL 25 MG PO CAPS
25.0000 mg | ORAL_CAPSULE | Freq: Once | ORAL | Status: AC
  Administered 2019-08-11: 25 mg via ORAL

## 2019-08-11 MED ORDER — ACETAMINOPHEN 325 MG PO TABS
ORAL_TABLET | ORAL | Status: AC
  Filled 2019-08-11: qty 2

## 2019-08-11 MED ORDER — HEPARIN SOD (PORK) LOCK FLUSH 100 UNIT/ML IV SOLN
500.0000 [IU] | Freq: Once | INTRAVENOUS | Status: AC | PRN
  Administered 2019-08-11: 500 [IU]
  Filled 2019-08-11: qty 5

## 2019-08-11 MED ORDER — ACETAMINOPHEN 325 MG PO TABS
650.0000 mg | ORAL_TABLET | Freq: Once | ORAL | Status: AC
  Administered 2019-08-11: 650 mg via ORAL

## 2019-08-11 MED ORDER — SODIUM CHLORIDE 0.9% FLUSH
10.0000 mL | Freq: Once | INTRAVENOUS | Status: AC
  Administered 2019-08-11: 10 mL
  Filled 2019-08-11: qty 10

## 2019-08-11 MED ORDER — TRASTUZUMAB-DKST CHEMO 150 MG IV SOLR
600.0000 mg | Freq: Once | INTRAVENOUS | Status: AC
  Administered 2019-08-11: 600 mg via INTRAVENOUS
  Filled 2019-08-11: qty 28.57

## 2019-08-11 MED ORDER — ACETAMINOPHEN 325 MG PO TABS
ORAL_TABLET | ORAL | Status: AC
  Filled 2019-08-11: qty 1

## 2019-08-11 NOTE — Patient Instructions (Signed)

## 2019-08-11 NOTE — Patient Instructions (Signed)
[facility] Discharge Instructions for Patients Receiving Chemotherapy  Today you received the following chemotherapy agents:  trastuzamab  To help prevent nausea and vomiting after your treatment, we encourage you to take your nausea medication as prescribed.   If you develop nausea and vomiting that is not controlled by your nausea medication, call the clinic.   BELOW ARE SYMPTOMS THAT SHOULD BE REPORTED IMMEDIATELY:  *FEVER GREATER THAN 100.5 F  *CHILLS WITH OR WITHOUT FEVER  NAUSEA AND VOMITING THAT IS NOT CONTROLLED WITH YOUR NAUSEA MEDICATION  *UNUSUAL SHORTNESS OF BREATH  *UNUSUAL BRUISING OR BLEEDING  TENDERNESS IN MOUTH AND THROAT WITH OR WITHOUT PRESENCE OF ULCERS  *URINARY PROBLEMS  *BOWEL PROBLEMS  UNUSUAL RASH Items with * indicate a potential emergency and should be followed up as soon as possible.  Feel free to call the clinic should you have any questions or concerns. The clinic phone number is [phone].  Please show the [place] at check-in to the Emergency Department and triage nurse.

## 2019-08-11 NOTE — Progress Notes (Signed)
[name] [name]  [phone] [phone]    ID: [name] [name] DOB: 1971/04/07  MR#: [id number]  BDZ#:[id number]  Patient Care Team: [name] Stalker, PA-C as PCP - General (Family Medicine) [name] Pain, MD as PCP - Cardiology (Cardiology) Magrinat, Virgie Dad, MD as Consulting Physician (Oncology) [name] Ave, MD as Referring Physician (Internal Medicine) [name] Heinz, MD as Consulting Physician (Nephrology) [name] Rudd, MD as Consulting Physician (Radiation Oncology) [name] Kussmaul, MD as Consulting Physician (General Surgery) [name] Dresser, MD as Consulting Physician (Cardiology) [name] Kaufmann, RN as Oncology Nurse Navigator [name] Germany, RN as Oncology Nurse Navigator OTHER MD:   CHIEF COMPLAINT: Triple positive breast cancer  CURRENT TREATMENT: Trastuzumab; anastrozole; to start Xgeva   HISTORY OF CURRENT ILLNESS: From the original intake note:  [name] [name] palpated a lump on 01/15/2018. She felt soreness and shooting [name] under her arm and in the right breast. She underwent bilateral diagnostic mammography with tomography and right breast ultrasonography at [facility] on 03/12/2018 showing: breast density category C. The is architectural distortion at the 11 o'clock position. There is also an oval mass in the right breast anterior depth. Examination of the right axilla showed enlarged lymph nodes. Ultrasonography showed a 4.6 cm mass in the right breast upper outer quadrant posterior depth. An additional 1.2 cm oval mass in the right breast 12 o'clock middle depth. The lymph nodes in the right axillary are highly suggestive of malignancy.   Accordingly on 03/16/2018 she proceeded to biopsy of the right breast area in question. The pathology from this procedure showed ([id number]): At both the 11 and 12 o'clock positions: Invasive ductal carcinoma grade II. Ductal carcinoma in situ, high grade, with necrosis and calcifications.  Prognostic indicators significant for: estrogen receptor, 90% positive and progesterone receptor, 40% positive, both with strong staining intensity. Proliferation marker Ki67 at 80%. HER2 amplified with ratios HER2/CEP17 SIGNALS 6.90 and average HER2 copies per cell 14.50  The patient's subsequent history is as detailed below.   INTERVAL HISTORY: [name] was seen today for follow-up and treatment of her triple positive breast cancer.  She continues on trastuzumab, which she receives every 28 days.  Tolerates this with no side effects that she is aware of.    [name] [name]'s last echocardiogram on 07/15/2019 that showed an EF of 60-65%.  She sees Dr. Haroldine [name].  She has a repeat echocardiogram scheduled on 10/18/2019.  She also continues on anastrozole. She takes gabapentin for hot flashes which helps, she uses coconut oil daily to help with this.  She has stopped taking Tramadol since she is no longer in [name].    [name] is due to start [name] [name].  This was delayed due to dental extractions.  She had 7 teeth removed and a bone graft on 07/30/2019.  She says she will undergo deep cleaning and bridge in 10/2019.    REVIEW OF SYSTEMS: [name] has periods where she will feel tired and achy.  It doesn't happen but every few weeks, but on these days she will feel fatigued, and sleepy, and have achiness in her joints.  She rests, and will feel better after a day or so.  She notes she is drinking plenty of fluids.  She denies any new [name], nausea, vomiting, bowel/bladder changes.  She is without headaches, fever, chills, chest [name], palpitations, shortness of breath.  A detailed ROS was otherwise non contributory.    PAST MEDICAL HISTORY: Past Medical History:  Diagnosis Date  . Anxiety   . Breast  cancer, left breast ([place]) 2000   Underwent lumpectomy, chemotherapy and radiation  . Facial paralysis/Bells palsy    left side  . Family history of lung cancer   . Family history of lymphoma   . Hypertension   .  Neuropathy    IN FINGERS AND TOES   RECENT INFUSION OF CHEMO  . Personal history of breast cancer 05/06/2018  . Renal disorder    just after TIA acute kidney injury  . TIA (transient ischemic attack) 03/16/2017   [facility]   The patient has a previous history of left breast cancer diagnosed in 2000. She was seen by [facility] in [place], [place] under Dr. Loreta [name]. According to the patient, her left breast cancer was stage II with no lymph node involvement (likely T2N0). She had chemotherapy every 3 weeks for about 6 months. She did not takes any "red drugs." She also did not take anti-estrogens (likely ER/PR negative).   TIA in 2018. She saw a neurologist as a precaution. She denies any clotting issues.    PAST SURGICAL HISTORY: Past Surgical History:  Procedure Laterality Date  . BREAST LUMPECTOMY WITH AXILLARY LYMPH NODE BIOPSY Left 2000   biopsy  . BREAST SURGERY     partial mastectomy with lymph node removal  . MASTECTOMY MODIFIED RADICAL Right 08/26/2018   Procedure: MASTECTOMY MODIFIED RADICAL;  Surgeon: [name] Kussmaul, MD;  Location: [place];  Service: General;  Laterality: Right;  . MODIFIED MASTECTOMY Right 08/26/2018  . PORTACATH PLACEMENT Left 04/08/2018   Procedure: INSERTION PORT-A-CATH;  Surgeon: [name] Kussmaul, MD;  Location: [place];  Service: General;  Laterality: Left;  . TUBAL LIGATION Bilateral 2004  . WISDOM TOOTH EXTRACTION      FAMILY HISTORY Family History  Problem Relation Age of Onset  . Hypertension Mother   . Hypertension Father   . Diverticulosis Father   . Diabetes Father   . Lung cancer Father 44       hx smoking  . Hypertension Maternal Grandmother   . CAD Maternal Grandmother   . Alzheimer's disease Maternal Grandmother        died at 63  . Hypertension Paternal Grandmother   . CAD Paternal Grandmother   . Stroke Paternal Grandfather   . Sickle cell trait Paternal Grandfather   . Pneumonia Maternal Aunt        died at a  young adult  . Lymphoma Paternal Aunt 75   The patient's father is alive at age 64 and had a history of lung cancer. The patient's mother is alive at age 72. The patient has 1 brother and no sisters. She denies a family history of breast or ovarian cancer.     GYNECOLOGIC HISTORY:  No LMP recorded. (Menstrual status: Perimenopausal). Menarche: 48 years old Age at first live birth: 48 years old She is GXP3. She is no longer having periods, which were irregular and light. Her LMP was  January 2019. She is not having hot flashes. The patient used oral contraceptive from [phone] and the Depo-provera shot from [phone] with no complications. She never used HRT.    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 Her husband, [name], is a Administrator. The patient's son, Shea Stakes age 46, works at [employer] in [place]. The patient's daughters, Lilia Pro age 28, and Levonne Spiller age 39, are students. The patient's adopted son, Vonna Kotyk age 67, is also a Ship broker.     ADVANCED DIRECTIVES:  HEALTH MAINTENANCE: Social History   Tobacco Use  . Smoking status: Never Smoker  . Smokeless tobacco: Never Used  Substance Use Topics  . Alcohol use: No  . Drug use: No     Colonoscopy: n/a  PAP: 2015  Bone density: never done   Allergies  Allergen Reactions  . Ace Inhibitors Swelling    Swelling of lips and face  . Lisinopril Swelling    Swelling of lips, face  . Amlodipine Swelling    Leg swelling  . Adhesive [Tape] Itching and Rash    Please use paper tape  . Latex Itching and Rash    Current Outpatient Medications  Medication Sig Dispense Refill  . anastrozole (ARIMIDEX) 1 MG tablet Take 1 tablet (1 mg total) by mouth daily. 90 tablet 4  . aspirin EC 81 MG tablet Take 81 mg by mouth daily.    . carvedilol (COREG) 3.125 MG tablet Take 1 tablet (3.125 mg total) by mouth 2 (two) times daily with a meal. 60 tablet 6  . Cyanocobalamin (VITAMIN B12) 500 MCG TABS Take 500  mcg by mouth daily.     . ferrous sulfate 325 (65 FE) MG tablet Take 1 tablet (325 mg total) by mouth daily. 30 tablet 0  . gabapentin (NEURONTIN) 300 MG capsule Take 1 capsule (300 mg total) by mouth at bedtime. 90 capsule 4  . lidocaine-prilocaine (EMLA) cream Apply 1 application topically as needed.    Marland Kitchen losartan (COZAAR) 50 MG tablet Take 1 tablet (50 mg total) by mouth daily. 30 tablet 11  . Multiple Vitamin (MULITIVITAMIN WITH MINERALS) TABS Take 1 tablet by mouth daily.      Marland Kitchen spironolactone-hydrochlorothiazide (ALDACTAZIDE) 25-25 MG tablet TAKE 1 TABLET BY MOUTH EVERY DAY 30 tablet 2   No current facility-administered medications for this visit.     OBJECTIVE:  Vitals:   08/11/19 0915  BP: 121/64  Pulse: 74  Resp: 17  Temp: 98.3 F (36.8 C)  SpO2: 100%     Body mass index is 45.16 kg/m.   Wt Readings from Last 3 Encounters:  08/11/19 223 lb 9.6 oz (101.4 kg)  07/15/19 225 lb (102.1 kg)  07/14/19 226 lb 3.2 oz (102.6 kg)  ECOG FS:1 - Symptomatic but completely ambulatory GENERAL: Patient is a well appearing female in no acute distress HEENT:  Sclerae anicteric.  Oropharynx clear and moist. No ulcerations or evidence of oropharyngeal candidiasis. Neck is supple.  NODES:  No cervical, supraclavicular, or axillary lymphadenopathy palpated.  BREAST EXAM:  Right breast s/p mastectomy, no sign of local recurrence, left breast s/p lumpectomy, mild scar tissue at lumpectomy site, otherwise benign. LUNGS:  Clear to auscultation bilaterally.  No wheezes or rhonchi. HEART:  Regular rate and rhythm. No murmur appreciated. ABDOMEN:  Soft, nontender.  Positive, normoactive bowel sounds. No organomegaly palpated. MSK:  No focal spinal tenderness to palpation. Full range of motion bilaterally in the upper extremities. EXTREMITIES:  Bilateral upper extremity swelling present SKIN:  Clear with no obvious rashes or skin changes. No nail dyscrasia. NEURO:  Nonfocal. Well oriented.   Appropriate affect.     LAB RESULTS:  CMP     Component Value Date/Time   NA 137 07/14/2019 0840   K 4.1 07/14/2019 0840   CL 103 07/14/2019 0840   CO2 22 07/14/2019 0840   GLUCOSE 104 (H) 07/14/2019 0840   BUN 33 (H) 07/14/2019 0840   CREATININE 1.41 (H) 07/14/2019 0840   CREATININE 1.36 (H) 05/19/2019 3338  CALCIUM 11.1 (H) 07/14/2019 0840   CALCIUM 10.2 06/25/2018 1425   PROT 7.4 07/14/2019 0840   ALBUMIN 3.6 07/14/2019 0840   AST 19 07/14/2019 0840   AST 19 05/19/2019 0832   ALT 27 07/14/2019 0840   ALT 24 05/19/2019 0832   ALKPHOS 113 07/14/2019 0840   BILITOT 0.3 07/14/2019 0840   BILITOT 0.3 05/19/2019 0832   GFRNONAA 44 (L) 07/14/2019 0840   GFRNONAA 46 (L) 05/19/2019 0832   GFRAA 51 (L) 07/14/2019 0840   GFRAA 54 (L) 05/19/2019 0832    Lab Results  Component Value Date   TOTALPROTELP 7.0 02/25/2019     Lab Results  Component Value Date   KPAFRELGTCHN 76.7 (H) 02/25/2019   LAMBDASER 27.2 (H) 02/25/2019   KAPLAMBRATIO 2.82 (H) 02/25/2019    Lab Results  Component Value Date   WBC 5.3 08/11/2019   NEUTROABS 3.4 08/11/2019   HGB 10.9 (L) 08/11/2019   HCT 34.3 (L) 08/11/2019   MCV 85.3 08/11/2019   PLT 198 08/11/2019    _0 @  No results found for: LABCA2  No components found for: UXLKGM010  No results for input(s): INR in the last 168 hours.  No results found for: LABCA2  No results found for: CAN199  No results found for: UVO536  No results found for: UYQ034  Lab Results  Component Value Date   CA2729 38.7 (H) 02/25/2019    No components found for: HGQUANT  No results found for: CEA1 / No results found for: CEA1   No results found for: AFPTUMOR  No results found for: [place]  No results found for: PSA1  Appointment on 08/11/2019  Component Date Value Ref Range Status  . WBC 08/11/2019 5.3  4.0 - 10.5 K/uL Final  . RBC 08/11/2019 4.02  3.87 - 5.11 MIL/uL Final  . Hemoglobin 08/11/2019 10.9* 12.0 - 15.0  g/dL Final  . HCT 08/11/2019 34.3* 36.0 - 46.0 % Final  . MCV 08/11/2019 85.3  80.0 - 100.0 fL Final  . MCH 08/11/2019 27.1  26.0 - 34.0 pg Final  . MCHC 08/11/2019 31.8  30.0 - 36.0 g/dL Final  . RDW 08/11/2019 14.9  11.5 - 15.5 % Final  . Platelets 08/11/2019 198  150 - 400 K/uL Final  . nRBC 08/11/2019 0.0  0.0 - 0.2 % Final  . Neutrophils Relative % 08/11/2019 64  % Final  . Neutro Abs 08/11/2019 3.4  1.7 - 7.7 K/uL Final  . Lymphocytes Relative 08/11/2019 22  % Final  . Lymphs Abs 08/11/2019 1.1  0.7 - 4.0 K/uL Final  . Monocytes Relative 08/11/2019 12  % Final  . Monocytes Absolute 08/11/2019 0.6  0.1 - 1.0 K/uL Final  . Eosinophils Relative 08/11/2019 2  % Final  . Eosinophils Absolute 08/11/2019 0.1  0.0 - 0.5 K/uL Final  . Basophils Relative 08/11/2019 1  % Final  . Basophils Absolute 08/11/2019 0.0  0.0 - 0.1 K/uL Final   Performed at [facility], [place] [address], [place], [place] [address]    (this displays the last labs from the last 3 days)  Lab Results  Component Value Date   TOTALPROTELP 7.0 02/25/2019   (this displays SPEP labs)  Lab Results  Component Value Date   KPAFRELGTCHN 76.7 (H) 02/25/2019   LAMBDASER 27.2 (H) 02/25/2019   KAPLAMBRATIO 2.82 (H) 02/25/2019   (kappa/lambda light chains)  No results found for: HGBA, HGBA2QUANT, HGBFQUANT, HGBSQUAN (Hemoglobinopathy evaluation)   No results found for: LDH  No results found for: IRON, TIBC, IRONPCTSAT (Iron and TIBC)  No results found for: FERRITIN  Urinalysis    Component Value Date/Time   COLORURINE STRAW (A) 03/17/2017 0600   APPEARANCEUR CLEAR (A) 03/17/2017 0600   LABSPEC 1.031 (H) 03/17/2017 0600   PHURINE 5.0 03/17/2017 0600   GLUCOSEU NEGATIVE 03/17/2017 0600   HGBUR NEGATIVE 03/17/2017 0600   BILIRUBINUR NEGATIVE 03/17/2017 0600   KETONESUR NEGATIVE 03/17/2017 0600   PROTEINUR NEGATIVE 03/17/2017 0600   UROBILINOGEN 0.2 11/29/2011 1656   NITRITE NEGATIVE  03/17/2017 0600   LEUKOCYTESUR NEGATIVE 03/17/2017 0600     STUDIES: No results found.  ELIGIBLE FOR AVAILABLE RESEARCH PROTOCOL: no   ASSESSMENT: 48 y.o. [place], [place] woman  (1) status post left lumpectomy in 2000 for a (?) T2N0 breast cancer,  (a) status post adjuvant chemotherapy  (b) status post adjuvant radiation  (c) did not receive antiestrogens  (2) genetics testing May 18, 2018 through the common Hereditary Cancers Panel + Myelodysplastic Syndrome/Leukemia Panel found no deleterious mutations in APC, ATM, AXIN2, BARD1, BLM, BMPR1A, BRCA1, BRCA2, BRIP1, CDH1, CDK4, CDKN2A (p14ARF), CDKN2A (p16INK4a), CEBPA, CHEK2, CTNNA1, DICER1, EPCAM*, GATA2, GREM1*, HRAS, KIT, MEN1, MLH1, MSH2, MSH3, MSH6, MUTYH, NBN, NF1, PALB2, PDGFRA, PMS2, POLD1, POLE, PTEN, RAD50, RAD51C, RAD51D, RUNX1, SDHB, SDHC, SDHD, SMAD4, SMARCA4, STK11, TERC, TERT, TP53, TSC1, TSC2, VHL The following genes were evaluated for sequence changes only: HOXB13*, NTHL1*, SDHA  (a)  3 variants of uncertain significance were identified in the genes BARD1 c.764A>G (p.Asn255Ser), BRIP1 c.2563C>T (p.Arg855Cys), and PALB2 c.3103A>T (p.Ile1035Phe).   (3) status post right breast biopsy 03/16/2018 for a clinical mT2-3 N2, anatomic stage III invasive ductal carcinoma, grade 2, triple positive, with an MIB-1 of 80%.  (a) bone scan and chest CT scan negative for metastases except for a T6 lytic lesion of concern  (b) thoracic spine MRI was ordered May 2019 but never performed.  (4) neoadjuvant chemotherapy consisting of carboplatin, docetaxel, trastuzumab and Pertuzumab every 3 weeks x 6 starting 04/16/2018  (a) pertuzumab held beginning with cycle 2 because of diarrhea  (5) trastuzumab to be continued Q4w indefinitely given diagnosis of stage IV disease below  (a) echocardiogram on 06/29/2018 showed an ejection fraction in the 65-70% range  (b) echocardiogram 11/19/2018 showed an ejection fraction in the 60-65% range  (c) echo  04/07/2019 shows an ejection fraction in the 55-60% range  (d) echo 07/15/2019 shows EF of 60-65%  (6) right mastectomy and sentinel lymph node sampling on 08/26/2018 found a residual 2.5cm invasive ductal carcinoma, with 2 of 5 sentinel lymph nodes positive, ypT2, ypN1a; margins were clear  (7) adjuvant radiation 09/30/2018 - 11/16/2018 Site/dose:   The patient initially received a dose of 50.4 Gy in 28 fractions to the right chest wall and supraclavicular region. This was delivered using a 3-D conformal, 4 field technique. The patient then received a boost to the mastectomy scar. This delivered an additional 10 Gy in 5 fractions using an en face electron field. The total dose was 60.4   (8) anastrozole started 12/03/2018  (a) [place] on 10/01/2018 was 64.3, and estradiol 7.0, consistent with menopause  (b) referral to pelvic floor rehabilitation 12/03/2018  METASTATIC DISEASE: March 2020 (9) CT of neck and CT angiography of the chest 02/04/2019 finds the T6 lytic lesion noted May 2019 (see #3 above) is now sclerotic; a sclerotic T3 lesion is stable; there is a new lytic lesion in the manubrium  (a) PET scan 03/12/2019 shows manubrium, T3 and T5 metastases,  no visceral disease  (b) biopsy of manubrial lesion planned but not done secondary to pandemic  (10) SRS to spine, palliative treatment to sternum, from 05/13/2019 through 05/26/2019: 1. Chest_sternum // 40 Gy in 10 fractions  2. Thoracic Spine (T3) // 18 Gy in 1 fraction  3. Thoracic Spine (T5) // 18 Gy in 1 fraction   PLAN: [name] is doing well today.  She continues on Anastrozole and Trastuzumab with good tolerance. She will continue on these.  We will continue to hold off on giving her the Xgeva as she just had dental extractions.  I again reviewed the benefits and risks of xgeva with her in detail.  We reviewed Rashell's h/o hyperparathyroidism, and hypercalcemia.  She is asymptomatic.  We are redrawing a PTH and calcium today.   [name]  and I talked about her stage IV breast cancer and restaging.  I reviewed with Dr. Jana Hakim and we will restage with a bone scan and CT chest.    [name] will return in 4 weeks for labs, f/u and her next treatment.  She was recommended to continue with the appropriate pandemic precautions. She knows to call for any questions that may arise between now and her next appointment.  We are happy to see her sooner if needed.  A total of (30) minutes of face-to-face time was spent with this patient with greater than 50% of that time in counseling and care-coordination.   Wilber Bihari, NP  08/11/19 9:48 AM Medical Oncology and Hematology [facility] [place], [place] [address] Tel. [phone]    Fax. [phone]

## 2019-08-12 LAB — VITAMIN D 25 HYDROXY (VIT D DEFICIENCY, FRACTURES): Vit D, 25-Hydroxy: 36.5 ng/mL (ref 30.0–100.0)

## 2019-08-12 LAB — PTH, INTACT AND CALCIUM
Calcium, Total (PTH): 11.1 mg/dL — ABNORMAL HIGH (ref 8.7–10.2)
PTH: 96 pg/mL — ABNORMAL HIGH (ref 15–65)

## 2019-08-17 LAB — PTH-RELATED PEPTIDE: PTH-related peptide: <2 pmol/L

## 2019-08-18 ENCOUNTER — Other Ambulatory Visit: Payer: Self-pay

## 2019-08-18 ENCOUNTER — Inpatient Hospital Stay: Payer: BC Managed Care – PPO

## 2019-08-18 ENCOUNTER — Other Ambulatory Visit: Payer: Self-pay | Admitting: Medical

## 2019-08-18 ENCOUNTER — Inpatient Hospital Stay (HOSPITAL_BASED_OUTPATIENT_CLINIC_OR_DEPARTMENT_OTHER): Payer: BC Managed Care – PPO | Admitting: Medical

## 2019-08-18 ENCOUNTER — Telehealth: Payer: Self-pay

## 2019-08-18 VITALS — BP 133/71 | HR 75 | Temp 98.5°F | Resp 18 | Ht 59.0 in | Wt 225.6 lb

## 2019-08-18 DIAGNOSIS — R509 Fever, unspecified: Secondary | ICD-10-CM

## 2019-08-18 DIAGNOSIS — Z79811 Long term (current) use of aromatase inhibitors: Secondary | ICD-10-CM | POA: Diagnosis not present

## 2019-08-18 DIAGNOSIS — C50411 Malignant neoplasm of upper-outer quadrant of right female breast: Secondary | ICD-10-CM

## 2019-08-18 DIAGNOSIS — M791 Myalgia, unspecified site: Secondary | ICD-10-CM | POA: Diagnosis not present

## 2019-08-18 DIAGNOSIS — C50811 Malignant neoplasm of overlapping sites of right female breast: Secondary | ICD-10-CM | POA: Diagnosis not present

## 2019-08-18 DIAGNOSIS — I10 Essential (primary) hypertension: Secondary | ICD-10-CM | POA: Diagnosis not present

## 2019-08-18 DIAGNOSIS — Z5112 Encounter for antineoplastic immunotherapy: Secondary | ICD-10-CM | POA: Diagnosis not present

## 2019-08-18 DIAGNOSIS — Z170 Estrogen receptor positive status [ER+]: Secondary | ICD-10-CM | POA: Diagnosis not present

## 2019-08-18 DIAGNOSIS — Z8673 Personal history of transient ischemic attack (TIA), and cerebral infarction without residual deficits: Secondary | ICD-10-CM | POA: Diagnosis not present

## 2019-08-18 DIAGNOSIS — Z79899 Other long term (current) drug therapy: Secondary | ICD-10-CM | POA: Diagnosis not present

## 2019-08-18 LAB — URINALYSIS, COMPLETE (UACMP) WITH MICROSCOPIC
Bacteria, UA: NONE SEEN
Bilirubin Urine: NEGATIVE
Glucose, UA: NEGATIVE mg/dL
Hgb urine dipstick: NEGATIVE
Ketones, ur: NEGATIVE mg/dL
Nitrite: NEGATIVE
Protein, ur: NEGATIVE mg/dL
Specific Gravity, Urine: 1.016 (ref 1.005–1.030)
pH: 5 (ref 5.0–8.0)

## 2019-08-18 MED ORDER — VENLAFAXINE HCL ER 37.5 MG PO CP24: 37.5000 mg | ORAL_CAPSULE | Freq: Every day | ORAL | 5 refills | Status: DC

## 2019-08-18 NOTE — Telephone Encounter (Signed)
Received 2 vm from pt requesting a call return. Returned call at number provided and received vm. lvm for pt to return call

## 2019-08-18 NOTE — Progress Notes (Signed)
Pt requested in lab to not be stuck peripherally for labs, including blood culture, stating that she has no fever currently and her temperature didn't go above 100 at home yesterday.  PA Lucianne Lei made aware, gave ok for pt to come to [facility] for assessment first before having any blood drawn peripherally or from port.  VO from PA Mangham to cancel all labs/urine orders for today.  Lab contacted and aware.  Pt amb to breast center lobby, waiting to see [facility] LPN about ADA paperwork.  Unable to contact Cecille Rubin via phone, alerted Clarise Cruz RN who will let her know that pt is waiting to see her.

## 2019-08-18 NOTE — Progress Notes (Signed)
[facility] Progress Note   [name] Gotham [id number] Feb 28, 1971 48 y.o.  [name] [name] is managed by Dr. Lurline Del  Actively treated with chemotherapy/immunotherapy/hormonal therapy: yes  Current therapy: Trastuzumab; anastrozole; and Xgeva  Last treated: 08/11/2019  Next scheduled appointment with provider:  09/08/2019  Assessment: Plan:    Malignant neoplasm of upper-outer quadrant of right breast in female, estrogen receptor positive ([place])  Fever, unspecified fever cause  Chills with fever  Myalgia  Hypercalcemia   Metastatic ER positive breast cancer: The patient continues to be followed by Dr. Jana Hakim and was most recently treated with trastuzumab and Xgeva on 08/11/2019. She is next scheduled to be seen on 09/08/2019.  Fevers, chills and myalgias: The patient did not have a true fever that she only recorded a temperature of 99.8.  It was discussed with her that this could be related to her treatments with trastuzumab and anastrozole.  She was started on a prescription of Effexor 37.5 mg p.o. once daily with instructions that she could increase to twice daily dosing after 2 to 3 weeks if she does not notice improvement in her symptoms.  Additionally she was also instructed to begin walking and push fluids.  Please see After Visit Summary for patient specific instructions.  Future Appointments  Date Time Provider [place]  08/18/2019  1:45 PM [facility] LAB 1 [facility] None  08/18/2019  2:00 PM Tanner, Lyndon Code., PA-C [facility] None  09/01/2019 12:00 PM [facility] INJ 1 [facility] [place]  09/01/2019  1:00 PM WL-CT 2 WL-CT [place]  09/01/2019  3:00 PM WL-NM 1 [facility]-NM [place]  09/08/2019  8:15 AM [facility] LAB 4 [facility] None  09/08/2019  8:30 AM [facility] [place] FLUSH [facility] None  09/08/2019  9:00 AM Causey, Charlestine Massed, NP [facility] None  09/08/2019  9:30 AM [facility] INFUSION [facility] None  10/18/2019 10:00 AM [facility] ECHO OP 1  [facility]  10/18/2019 11:00 AM Bensimhon, Shaune Pascal, MD [facility] None    No orders of the defined types were placed in this encounter.      Subjective:   Patient ID:  [name] Hinson is a 48 y.o. (DOB 30-Apr-1971) female.  Chief Complaint: No chief complaint on file.   HPI [name] Pozo is a 48 year old female with a diagnosis of a metastatic ER positive breast cancer.  She is managed by Dr. Jana Hakim and was most recently treated with trastuzumab and Xgeva on 08/11/2019.  She continues on anastrozole.  She presents to the office today with report that she had an elevated temperature of 99.8 yesterday with chills.  She took Tylenol.  She reports having fatigue and joint and muscle pain.  She denies nausea, vomiting, diarrhea, chest pain, shortness of breath, respiratory symptoms and has no fever or chills today.  Medications: I have reviewed the patient's current medications.  Allergies:  Allergies  Allergen Reactions  . Ace Inhibitors Swelling    Swelling of lips and face  . Lisinopril Swelling    Swelling of lips, face  . Amlodipine Swelling    Leg swelling  . Adhesive [Tape] Itching and Rash    Please use paper tape  . Latex Itching and Rash    Past Medical History:  Diagnosis Date  . Anxiety   . Breast cancer, left breast ([place]) 2000   Underwent lumpectomy, chemotherapy and radiation  . Facial paralysis/Bells palsy    left side  . Family history of lung cancer   . Family history of lymphoma   .  Hypertension   . Neuropathy    IN FINGERS AND TOES   RECENT INFUSION OF CHEMO  . Personal history of breast cancer 05/06/2018  . Renal disorder    just after TIA acute kidney injury  . TIA (transient ischemic attack) 03/16/2017   [facility]    Past Surgical History:  Procedure Laterality Date  . BREAST LUMPECTOMY WITH AXILLARY LYMPH NODE BIOPSY Left 2000   biopsy  . BREAST SURGERY     partial mastectomy with lymph node removal  . MASTECTOMY MODIFIED RADICAL  Right 08/26/2018   Procedure: MASTECTOMY MODIFIED RADICAL;  Surgeon: Jovita Kussmaul, MD;  Location: [place];  Service: General;  Laterality: Right;  . MODIFIED MASTECTOMY Right 08/26/2018  . PORTACATH PLACEMENT Left 04/08/2018   Procedure: INSERTION PORT-A-CATH;  Surgeon: Jovita Kussmaul, MD;  Location: [place];  Service: General;  Laterality: Left;  . TUBAL LIGATION Bilateral 2004  . WISDOM TOOTH EXTRACTION      Family History  Problem Relation Age of Onset  . Hypertension Mother   . Hypertension Father   . Diverticulosis Father   . Diabetes Father   . Lung cancer Father 74       hx smoking  . Hypertension Maternal Grandmother   . CAD Maternal Grandmother   . Alzheimer's disease Maternal Grandmother        died at 12  . Hypertension Paternal Grandmother   . CAD Paternal Grandmother   . Stroke Paternal Grandfather   . Sickle cell trait Paternal Grandfather   . Pneumonia Maternal Aunt        died at a young adult  . Lymphoma Paternal Aunt 21    Social History   Socioeconomic History  . Marital status: Married    Spouse name: [name]  . Number of children: 3  . Years of education: 42  . 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 Financial resource strain: Not on file  . Food insecurity    Worry: Not on file    Inability: Not on file  . Transportation needs    Medical: No    Non-medical: No  Tobacco Use  . Smoking status: Never Smoker  . Smokeless tobacco: Never Used  Substance and Sexual Activity  . Alcohol use: No  . Drug use: No  . Sexual activity: Yes    Partners: Male    Birth control/protection: Surgical  Lifestyle  . Physical activity    Days per week: Not on file    Minutes per session: Not on file  . Stress: Not on file  Relationships  . Social Herbalist on phone: Not on file    Gets together: Not on file    Attends religious service: Not on file    Active member of club or  organization: Not on file    Attends meetings of clubs or organizations: Not on file    Relationship status: Not on file  . Intimate partner violence    Fear of current or ex partner: No    Emotionally abused: No    Physically abused: No    Forced sexual activity: No  Other Topics Concern  . Not on file  Social History Narrative   Lives with family   No caffeine    Past Medical History, Surgical history, Social history, and Family history were reviewed and updated as appropriate.   Please see  review of systems for further details on the patient's review from today.   Review of Systems:  Review of Systems  Constitutional: Positive for fatigue. Negative for chills, diaphoresis and fever.  HENT: Negative for trouble swallowing and voice change.   Respiratory: Negative for cough, chest tightness, shortness of breath and wheezing.   Cardiovascular: Negative for chest pain and palpitations.  Gastrointestinal: Negative for abdominal pain, constipation, diarrhea, nausea and vomiting.  Musculoskeletal: Positive for arthralgias and myalgias. Negative for back pain.  Neurological: Negative for dizziness, light-headedness and headaches.    Objective:   Physical Exam:  There were no vitals taken for this visit. ECOG: 0  Physical Exam Constitutional:      General: She is not in acute distress.    Appearance: She is not diaphoretic.  HENT:     Head: Normocephalic and atraumatic.  Cardiovascular:     Rate and Rhythm: Normal rate and regular rhythm.     Heart sounds: Normal heart sounds. No murmur. No friction rub. No gallop.   Pulmonary:     Effort: Pulmonary effort is normal. No respiratory distress.     Breath sounds: Normal breath sounds. No wheezing or rales.  Skin:    General: Skin is warm and dry.     Findings: No erythema or rash.  Neurological:     Mental Status: She is alert.     Gait: Gait normal.  Psychiatric:        Mood and Affect: Mood normal.        Behavior:  Behavior normal.        Thought Content: Thought content normal.        Judgment: Judgment normal.     Lab Review:     Component Value Date/Time   NA 141 08/11/2019 0836   K 4.2 08/11/2019 0836   CL 107 08/11/2019 0836   CO2 25 08/11/2019 0836   GLUCOSE 110 (H) 08/11/2019 0836   BUN 26 (H) 08/11/2019 0836   CREATININE 1.48 (H) 08/11/2019 0836   CREATININE 1.36 (H) 05/19/2019 0832   CALCIUM 11.1 (H) 08/11/2019 0836   CALCIUM 11.3 (H) 08/11/2019 0836   PROT 7.6 08/11/2019 0836   ALBUMIN 3.7 08/11/2019 0836   AST 20 08/11/2019 0836   AST 19 05/19/2019 0832   ALT 32 08/11/2019 0836   ALT 24 05/19/2019 0832   ALKPHOS 119 08/11/2019 0836   BILITOT 0.3 08/11/2019 0836   BILITOT 0.3 05/19/2019 0832   GFRNONAA 42 (L) 08/11/2019 0836   GFRNONAA 46 (L) 05/19/2019 0832   GFRAA 48 (L) 08/11/2019 0836   GFRAA 54 (L) 05/19/2019 0832       Component Value Date/Time   WBC 5.3 08/11/2019 0836   RBC 4.02 08/11/2019 0836   HGB 10.9 (L) 08/11/2019 0836   HGB 13.1 03/25/2018 1129   HCT 34.3 (L) 08/11/2019 0836   PLT 198 08/11/2019 0836   PLT 187 03/25/2018 1129   MCV 85.3 08/11/2019 0836   MCH 27.1 08/11/2019 0836   MCHC 31.8 08/11/2019 0836   RDW 14.9 08/11/2019 0836   LYMPHSABS 1.1 08/11/2019 0836   MONOABS 0.6 08/11/2019 0836   EOSABS 0.1 08/11/2019 0836   BASOSABS 0.0 08/11/2019 0836   -------------------------------  Imaging from last 24 hours (if applicable):  Radiology interpretation: No results found.      This case was discussed with Dr. Jana Hakim. He expressed his agreement with my management of this patient.

## 2019-08-18 NOTE — Patient Instructions (Signed)
COVID-19: How to Protect Yourself and Others Know how it spreads  There is currently no vaccine to prevent coronavirus disease 2019 (COVID-19).  The best way to prevent illness is to avoid being exposed to this virus.  The virus is thought to spread mainly from person-to-person. ? Between people who are in close contact with one another (within about 6 feet). ? Through respiratory droplets produced when an infected person coughs, sneezes or talks. ? These droplets can land in the mouths or noses of people who are nearby or possibly be inhaled into the lungs. ? Some recent studies have suggested that COVID-19 may be spread by people who are not showing symptoms. Everyone should Clean your hands often  Wash your hands often with soap and water for at least 20 seconds especially after you have been in a public place, or after blowing your nose, coughing, or sneezing.  If soap and water are not readily available, use a hand sanitizer that contains at least 60% alcohol. Cover all surfaces of your hands and rub them together until they feel dry.  Avoid touching your eyes, nose, and mouth with unwashed hands. Avoid close contact  Stay home if you are sick.  Avoid close contact with people who are sick.  Put distance between yourself and other people. ? Remember that some people without symptoms may be able to spread virus. ? This is especially important for people who are at higher risk of getting very sick.www.cdc.gov/coronavirus/2019-ncov/need-extra-precautions/people-at-higher-risk.html Cover your mouth and nose with a cloth face cover when around others  You could spread COVID-19 to others even if you do not feel sick.  Everyone should wear a cloth face cover when they have to go out in public, for example to the grocery store or to pick up other necessities. ? Cloth face coverings should not be placed on young children under age 2, anyone who has trouble breathing, or is unconscious,  incapacitated or otherwise unable to remove the mask without assistance.  The cloth face cover is meant to protect other people in case you are infected.  Do NOT use a facemask meant for a healthcare worker.  Continue to keep about 6 feet between yourself and others. The cloth face cover is not a substitute for social distancing. Cover coughs and sneezes  If you are in a private setting and do not have on your cloth face covering, remember to always cover your mouth and nose with a tissue when you cough or sneeze or use the inside of your elbow.  Throw used tissues in the trash.  Immediately wash your hands with soap and water for at least 20 seconds. If soap and water are not readily available, clean your hands with a hand sanitizer that contains at least 60% alcohol. Clean and disinfect  Clean AND disinfect frequently touched surfaces daily. This includes tables, doorknobs, light switches, countertops, handles, desks, phones, keyboards, toilets, faucets, and sinks. www.cdc.gov/coronavirus/2019-ncov/prevent-getting-sick/disinfecting-your-home.html  If surfaces are dirty, clean them: Use detergent or soap and water prior to disinfection.  Then, use a household disinfectant. You can see a list of EPA-registered household disinfectants here. cdc.gov/coronavirus 04/06/2019 This information is not intended to replace advice given to you by your health care provider. Make sure you discuss any questions you have with your health care provider. Document Released: 03/16/2019 Document Revised: 04/14/2019 Document Reviewed: 03/16/2019 Elsevier Patient Education  2020 Elsevier Inc.  

## 2019-08-19 LAB — URINE CULTURE

## 2019-08-24 ENCOUNTER — Other Ambulatory Visit: Payer: Self-pay | Admitting: Radiation Therapy

## 2019-08-24 DIAGNOSIS — C7951 Secondary malignant neoplasm of bone: Secondary | ICD-10-CM

## 2019-08-30 ENCOUNTER — Other Ambulatory Visit: Payer: Self-pay

## 2019-08-30 ENCOUNTER — Ambulatory Visit (HOSPITAL_COMMUNITY)
Admission: RE | Admit: 2019-08-30 | Discharge: 2019-08-30 | Disposition: A | Discharge status: Home / Self Care | Payer: BC Managed Care – PPO | Source: Ambulatory Visit | Attending: Radiation Oncology | Admitting: Radiation Oncology

## 2019-08-30 ENCOUNTER — Encounter (HOSPITAL_COMMUNITY): Payer: Self-pay

## 2019-08-30 DIAGNOSIS — C7951 Secondary malignant neoplasm of bone: Secondary | ICD-10-CM

## 2019-08-30 NOTE — Progress Notes (Signed)
Attempted pt MRI on 08-30-19 but pt stated the machine was too tight and she wanted to go where she went last time.

## 2019-08-31 ENCOUNTER — Other Ambulatory Visit: Payer: Self-pay | Admitting: Radiation Therapy

## 2019-08-31 DIAGNOSIS — C7951 Secondary malignant neoplasm of bone: Secondary | ICD-10-CM

## 2019-09-01 ENCOUNTER — Ambulatory Visit (HOSPITAL_COMMUNITY)
Admission: RE | Admit: 2019-09-01 | Discharge: 2019-09-01 | Disposition: A | Discharge status: Home / Self Care | Payer: BC Managed Care – PPO | Source: Ambulatory Visit | Attending: Adult Health | Admitting: Adult Health

## 2019-09-01 ENCOUNTER — Encounter (HOSPITAL_COMMUNITY)
Admission: RE | Admit: 2019-09-01 | Discharge: 2019-09-01 | Disposition: A | Discharge status: Home / Self Care | Payer: BC Managed Care – PPO | Source: Ambulatory Visit | Attending: Adult Health | Admitting: Adult Health

## 2019-09-01 ENCOUNTER — Other Ambulatory Visit: Payer: Self-pay

## 2019-09-01 ENCOUNTER — Encounter (HOSPITAL_COMMUNITY): Payer: Self-pay

## 2019-09-01 DIAGNOSIS — C50911 Malignant neoplasm of unspecified site of right female breast: Secondary | ICD-10-CM | POA: Diagnosis not present

## 2019-09-01 DIAGNOSIS — C7951 Secondary malignant neoplasm of bone: Secondary | ICD-10-CM | POA: Insufficient documentation

## 2019-09-01 DIAGNOSIS — Z170 Estrogen receptor positive status [ER+]: Secondary | ICD-10-CM

## 2019-09-01 DIAGNOSIS — C50411 Malignant neoplasm of upper-outer quadrant of right female breast: Secondary | ICD-10-CM

## 2019-09-01 DIAGNOSIS — C50919 Malignant neoplasm of unspecified site of unspecified female breast: Secondary | ICD-10-CM | POA: Diagnosis not present

## 2019-09-01 MED ORDER — HEPARIN SOD (PORK) LOCK FLUSH 100 UNIT/ML IV SOLN
500.0000 [IU] | Freq: Once | INTRAVENOUS | Status: AC
  Administered 2019-09-01: 500 [IU] via INTRAVENOUS

## 2019-09-01 MED ORDER — HEPARIN SOD (PORK) LOCK FLUSH 100 UNIT/ML IV SOLN
INTRAVENOUS | Status: AC
  Filled 2019-09-01: qty 5

## 2019-09-01 MED ORDER — TECHNETIUM TC 99M MEDRONATE IV KIT
20.2000 | PACK | Freq: Once | INTRAVENOUS | Status: AC | PRN
  Administered 2019-09-01: 20.2 via INTRAVENOUS

## 2019-09-01 MED ORDER — SODIUM CHLORIDE (PF) 0.9 % IJ SOLN
INTRAMUSCULAR | Status: AC
  Filled 2019-09-01: qty 50

## 2019-09-01 MED ORDER — IOHEXOL 300 MG/ML  SOLN
75.0000 mL | Freq: Once | INTRAMUSCULAR | Status: AC | PRN
  Administered 2019-09-01: 75 mL via INTRAVENOUS

## 2019-09-02 ENCOUNTER — Ambulatory Visit (HOSPITAL_COMMUNITY)
Admission: RE | Admit: 2019-09-02 | Discharge: 2019-09-02 | Disposition: A | Discharge status: Home / Self Care | Payer: BC Managed Care – PPO | Source: Ambulatory Visit | Attending: Radiation Oncology | Admitting: Radiation Oncology

## 2019-09-02 DIAGNOSIS — C7951 Secondary malignant neoplasm of bone: Secondary | ICD-10-CM | POA: Diagnosis not present

## 2019-09-02 DIAGNOSIS — C50919 Malignant neoplasm of unspecified site of unspecified female breast: Secondary | ICD-10-CM | POA: Diagnosis not present

## 2019-09-02 MED ORDER — GADOBUTROL 1 MMOL/ML IV SOLN
10.0000 mL | Freq: Once | INTRAVENOUS | Status: AC | PRN
  Administered 2019-09-02: 10 mL via INTRAVENOUS

## 2019-09-06 ENCOUNTER — Other Ambulatory Visit: Payer: Self-pay

## 2019-09-06 ENCOUNTER — Ambulatory Visit
Admission: RE | Admit: 2019-09-06 | Discharge: 2019-09-06 | Disposition: A | Discharge status: Home / Self Care | Payer: BC Managed Care – PPO | Source: Ambulatory Visit | Attending: Radiation Oncology | Admitting: Radiation Oncology

## 2019-09-06 ENCOUNTER — Other Ambulatory Visit: Payer: Self-pay | Admitting: Oncology

## 2019-09-06 DIAGNOSIS — Z170 Estrogen receptor positive status [ER+]: Secondary | ICD-10-CM | POA: Diagnosis not present

## 2019-09-06 DIAGNOSIS — R918 Other nonspecific abnormal finding of lung field: Secondary | ICD-10-CM | POA: Diagnosis not present

## 2019-09-06 DIAGNOSIS — C50411 Malignant neoplasm of upper-outer quadrant of right female breast: Secondary | ICD-10-CM

## 2019-09-06 DIAGNOSIS — C7951 Secondary malignant neoplasm of bone: Secondary | ICD-10-CM | POA: Diagnosis not present

## 2019-09-06 NOTE — Progress Notes (Signed)
Radiation Oncology         [phone] ________________________________  Outpatient Follow Up - Conducted via telephone due to current COVID-19 concerns for limiting patient exposure  I spoke with the patient to conduct this consult visit via telephone to spare the patient unnecessary potential exposure in the healthcare setting during the current COVID-19 pandemic. The patient was notified in advance and was offered a [place] meeting to allow for face to face communication but unfortunately reported that they did not have the appropriate resources/technology to support such a visit and instead preferred to proceed with a telephone visit.  ________________________________  Name: [name] [name] MRN: [id number]  Date of Service: 09/06/2019  DOB: 1971/05/15  Post Treatment Telephone Note  Diagnosis:  Recurrent Metastatic Stage IIA, cT2N2M0 grade 2, Triple positive invasive ductal carcinoma of the right breast.  Interval Since Last Radiation:  4 months  05/19/2019 SRS Treatment: T5 was treated to 18 Gy in 1 fraction  05/18/2019 SRS Treatment:  T3 was treated to 18 Gy in 1 fraction  05/13/2019-05/26/2019:  30 Gy in 10 fractions was given to the sternum  09/30/2018 - 11/16/2018: The patient initially received a dose of 50.4 Gy in 28 fractions to the rightchest wall and supraclavicular region. This was delivered using a 3-D conformal, 4 field technique. The patient then received a boost to the mastectomy scar. This delivered an additional 10 Gy in 5 fractions using an en face electron field. The total dose was 60.4 Gy.  2000: external beam radiotherapy to the left breast with Dr. Lucia Gaskins at [facility] in [place].   Narrative:  This is a pleasant 48 y.o. female with a history of Recurrent Metastatic Stage IIA, cT2N2M0 grade 2, Triple positive invasive ductal carcinoma of the right breast. She has had multple courses of radiotherapy with her most recent course having been SRS to T3 and T5 in June 2020.  She has remained on antiestrogen therapy, Xgeva, and Herceptin. She had a recent bone scan on 09/01/2019 for staging purposes and this revealed progressive bony disease in the T spine at T5, new lesion at T10, and increase in the sclerotic changes at the manubrium. Bone Scan on 09/01/2019 revealed disease at T6, T3, and right fourth rib. The MRI of the T spine on 09/02/2019 revealed disease at T6, and T10 without fracture or epidural involvement, and stable changes at T3 and T5. Her case in conference this am was discussed and no focal treatment to these new or previously treated sites was recommended. She is to meet with medical oncology later this week as well as there were two new 3 mm lesions in the lungs and some increased thickening of her mastectomy site.    Past Medical History:  Past Medical History:  Diagnosis Date  . Anxiety   . Breast cancer, left breast ([place]) 2000   Underwent lumpectomy, chemotherapy and radiation  . Facial paralysis/Bells palsy    left side  . Family history of lung cancer   . Family history of lymphoma   . Hypertension   . Neuropathy    IN FINGERS AND TOES   RECENT INFUSION OF CHEMO  . Personal history of breast cancer 05/06/2018  . Renal disorder    just after TIA acute kidney injury  . TIA (transient ischemic attack) 03/16/2017   [facility]    Past Surgical History: Past Surgical History:  Procedure Laterality Date  . BREAST LUMPECTOMY WITH AXILLARY LYMPH NODE BIOPSY Left 2000   biopsy  . BREAST  SURGERY     partial mastectomy with lymph node removal  . MASTECTOMY MODIFIED RADICAL Right 08/26/2018   Procedure: MASTECTOMY MODIFIED RADICAL;  Surgeon: Jovita Kussmaul, MD;  Location: [place];  Service: General;  Laterality: Right;  . MODIFIED MASTECTOMY Right 08/26/2018  . PORTACATH PLACEMENT Left 04/08/2018   Procedure: INSERTION PORT-A-CATH;  Surgeon: Jovita Kussmaul, MD;  Location: [place];  Service: General;  Laterality: Left;  . TUBAL LIGATION Bilateral  2004  . WISDOM TOOTH EXTRACTION      Social History:  Social History   Socioeconomic History  . Marital status: Married    Spouse name: [name]  . Number of children: 3  . Years of education: 49  . 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 Financial resource strain: Not on file  . Food insecurity    Worry: Not on file    Inability: Not on file  . Transportation needs    Medical: No    Non-medical: No  Tobacco Use  . Smoking status: Never Smoker  . Smokeless tobacco: Never Used  Substance and Sexual Activity  . Alcohol use: No  . Drug use: No  . Sexual activity: Yes    Partners: Male    Birth control/protection: Surgical  Lifestyle  . Physical activity    Days per week: Not on file    Minutes per session: Not on file  . Stress: Not on file  Relationships  . Social Herbalist on phone: Not on file    Gets together: Not on file    Attends religious service: Not on file    Active member of club or organization: Not on file    Attends meetings of clubs or organizations: Not on file    Relationship status: Not on file  . Intimate partner violence    Fear of current or ex partner: No    Emotionally abused: No    Physically abused: No    Forced sexual activity: No  Other Topics Concern  . Not on file  Social History Narrative   Lives with family   No caffeine    Family History: Family History  Problem Relation Age of Onset  . Hypertension Mother   . Hypertension Father   . Diverticulosis Father   . Diabetes Father   . Lung cancer Father 56       hx smoking  . Hypertension Maternal Grandmother   . CAD Maternal Grandmother   . Alzheimer's disease Maternal Grandmother        died at 42  . Hypertension Paternal Grandmother   . CAD Paternal Grandmother   . Stroke Paternal Grandfather   . Sickle cell trait Paternal Grandfather   . Pneumonia Maternal Aunt        died at a young  adult  . Lymphoma Paternal Aunt 1   Allergies:  Allergies  Allergen Reactions  . Ace Inhibitors Swelling    Swelling of lips and face  . Lisinopril Swelling    Swelling of lips, face  . Amlodipine Swelling    Leg swelling  . Adhesive [Tape] Itching and Rash    Please use paper tape  . Latex Itching and Rash   Medications: Current Outpatient Medications  Medication Sig Dispense Refill  . anastrozole (ARIMIDEX) 1 MG tablet Take 1 tablet (1 mg total) by mouth daily. 90 tablet 4  . aspirin EC  81 MG tablet Take 81 mg by mouth daily.    . carvedilol (COREG) 3.125 MG tablet Take 1 tablet (3.125 mg total) by mouth 2 (two) times daily with a meal. 60 tablet 6  . Cyanocobalamin (VITAMIN B12) 500 MCG TABS Take 500 mcg by mouth daily.     . ferrous sulfate 325 (65 FE) MG tablet Take 1 tablet (325 mg total) by mouth daily. 30 tablet 0  . gabapentin (NEURONTIN) 300 MG capsule Take 1 capsule (300 mg total) by mouth at bedtime. 90 capsule 4  . lidocaine-prilocaine (EMLA) cream Apply 1 application topically as needed.    Marland Kitchen losartan (COZAAR) 50 MG tablet Take 1 tablet (50 mg total) by mouth daily. 30 tablet 11  . Multiple Vitamin (MULITIVITAMIN WITH MINERALS) TABS Take 1 tablet by mouth daily.      Marland Kitchen spironolactone-hydrochlorothiazide (ALDACTAZIDE) 25-25 MG tablet TAKE 1 TABLET BY MOUTH EVERY DAY 30 tablet 2  . venlafaxine XR (EFFEXOR-XR) 37.5 MG 24 hr capsule Take 1 capsule (37.5 mg total) by mouth daily with breakfast. 30 capsule 5   No current facility-administered medications for this encounter.     Physical Exam:  Unable to assess due to encounter type.  Impression/Plan: 1. Recurrent Metastatic Stage IIA, cT2N2M0 grade 2, Triple positive invasive ductal carcinoma of the right breast. We reviewed the findings from her CT and bone scan as well as from the MRI of the T spine. She does not have any sites that would benefit from radiotherapy at this time. We will plan to follow up with her in 3  months time and anticipate an annual scan next spring/summer of the complete spine. She is in agreement and will follow up with medical oncology later this week.    Given current concerns for patient exposure during the COVID-19 pandemic, this encounter was conducted via telephone.  The patient has given verbal consent for this type of encounter. The time spent during this encounter was 15 minutes and 50% of that time was spent in the coordination of her care. The attendants for this meeting include Shona Simpson, [facility] and [name] Ridley  During the encounter, Shona Simpson [facility] was located at [facility] Radiation Oncology Department.  [name] Favero  was located at home.    Carola Rhine, PAC

## 2019-09-08 ENCOUNTER — Inpatient Hospital Stay: Payer: BC Managed Care – PPO

## 2019-09-08 ENCOUNTER — Encounter: Payer: Self-pay | Admitting: Adult Health

## 2019-09-08 ENCOUNTER — Other Ambulatory Visit: Payer: Self-pay

## 2019-09-08 ENCOUNTER — Encounter: Payer: Self-pay | Admitting: Oncology

## 2019-09-08 ENCOUNTER — Telehealth: Payer: Self-pay

## 2019-09-08 ENCOUNTER — Inpatient Hospital Stay: Payer: BC Managed Care – PPO | Attending: Oncology

## 2019-09-08 ENCOUNTER — Inpatient Hospital Stay (HOSPITAL_BASED_OUTPATIENT_CLINIC_OR_DEPARTMENT_OTHER): Payer: BC Managed Care – PPO | Admitting: Adult Health

## 2019-09-08 VITALS — BP 119/51 | HR 83 | Temp 98.0°F | Resp 18 | Ht 59.0 in | Wt 224.5 lb

## 2019-09-08 DIAGNOSIS — C50411 Malignant neoplasm of upper-outer quadrant of right female breast: Secondary | ICD-10-CM

## 2019-09-08 DIAGNOSIS — C50811 Malignant neoplasm of overlapping sites of right female breast: Secondary | ICD-10-CM | POA: Insufficient documentation

## 2019-09-08 DIAGNOSIS — C7951 Secondary malignant neoplasm of bone: Secondary | ICD-10-CM | POA: Diagnosis not present

## 2019-09-08 DIAGNOSIS — Z5112 Encounter for antineoplastic immunotherapy: Secondary | ICD-10-CM | POA: Insufficient documentation

## 2019-09-08 DIAGNOSIS — Z95828 Presence of other vascular implants and grafts: Secondary | ICD-10-CM

## 2019-09-08 DIAGNOSIS — Z79811 Long term (current) use of aromatase inhibitors: Secondary | ICD-10-CM | POA: Insufficient documentation

## 2019-09-08 DIAGNOSIS — Z170 Estrogen receptor positive status [ER+]: Secondary | ICD-10-CM

## 2019-09-08 LAB — COMPREHENSIVE METABOLIC PANEL
ALT: 32 U/L (ref 0–44)
AST: 21 U/L (ref 15–41)
Albumin: 3.6 g/dL (ref 3.5–5.0)
Alkaline Phosphatase: 136 U/L — ABNORMAL HIGH (ref 38–126)
Anion gap: 9 (ref 5–15)
BUN: 36 mg/dL — ABNORMAL HIGH (ref 6–20)
CO2: 22 mmol/L (ref 22–32)
Calcium: 11 mg/dL — ABNORMAL HIGH (ref 8.9–10.3)
Chloride: 104 mmol/L (ref 98–111)
Creatinine, Ser: 1.84 mg/dL — ABNORMAL HIGH (ref 0.44–1.00)
GFR calc Af Amer: 37 mL/min — ABNORMAL LOW (ref >60)
GFR calc non Af Amer: 32 mL/min — ABNORMAL LOW (ref >60)
Glucose, Bld: 119 mg/dL — ABNORMAL HIGH (ref 70–99)
Potassium: 4.3 mmol/L (ref 3.5–5.1)
Sodium: 135 mmol/L (ref 135–145)
Total Bilirubin: 0.3 mg/dL (ref 0.3–1.2)
Total Protein: 7.4 g/dL (ref 6.5–8.1)

## 2019-09-08 LAB — CBC WITH DIFFERENTIAL/PLATELET
Abs Immature Granulocytes: 0.02 10*3/uL (ref 0.00–0.07)
Basophils Absolute: 0 10*3/uL (ref 0.0–0.1)
Basophils Relative: 1 %
Eosinophils Absolute: 0.1 10*3/uL (ref 0.0–0.5)
Eosinophils Relative: 2 %
HCT: 33.6 % — ABNORMAL LOW (ref 36.0–46.0)
Hemoglobin: 11.2 g/dL — ABNORMAL LOW (ref 12.0–15.0)
Immature Granulocytes: 1 %
Lymphocytes Relative: 19 %
Lymphs Abs: 0.8 10*3/uL (ref 0.7–4.0)
MCH: 28.1 pg (ref 26.0–34.0)
MCHC: 33.3 g/dL (ref 30.0–36.0)
MCV: 84.2 fL (ref 80.0–100.0)
Monocytes Absolute: 0.5 10*3/uL (ref 0.1–1.0)
Monocytes Relative: 12 %
Neutro Abs: 2.7 10*3/uL (ref 1.7–7.7)
Neutrophils Relative %: 65 %
Platelets: 161 10*3/uL (ref 150–400)
RBC: 3.99 MIL/uL (ref 3.87–5.11)
RDW: 14.9 % (ref 11.5–15.5)
WBC: 4.2 10*3/uL (ref 4.0–10.5)
nRBC: 0 % (ref 0.0–0.2)

## 2019-09-08 MED ORDER — DENOSUMAB 120 MG/1.7ML ~~LOC~~ SOLN
SUBCUTANEOUS | Status: AC
  Filled 2019-09-08: qty 1.7

## 2019-09-08 MED ORDER — DIPHENHYDRAMINE HCL 25 MG PO CAPS
25.0000 mg | ORAL_CAPSULE | Freq: Once | ORAL | Status: AC
  Administered 2019-09-08: 25 mg via ORAL

## 2019-09-08 MED ORDER — SODIUM CHLORIDE 0.9 % IV SOLN
Freq: Once | INTRAVENOUS | Status: AC
  Administered 2019-09-08: 10:00:00 via INTRAVENOUS
  Filled 2019-09-08: qty 250

## 2019-09-08 MED ORDER — SODIUM CHLORIDE 0.9% FLUSH
10.0000 mL | Freq: Once | INTRAVENOUS | Status: AC
  Administered 2019-09-08: 10 mL
  Filled 2019-09-08: qty 10

## 2019-09-08 MED ORDER — DENOSUMAB 120 MG/1.7ML ~~LOC~~ SOLN
120.0000 mg | Freq: Once | SUBCUTANEOUS | Status: AC
  Administered 2019-09-08: 120 mg via SUBCUTANEOUS

## 2019-09-08 MED ORDER — SODIUM CHLORIDE 0.9% FLUSH
10.0000 mL | INTRAVENOUS | Status: DC | PRN
  Administered 2019-09-08: 10 mL
  Filled 2019-09-08: qty 10

## 2019-09-08 MED ORDER — TRASTUZUMAB-DKST CHEMO 150 MG IV SOLR
600.0000 mg | Freq: Once | INTRAVENOUS | Status: AC
  Administered 2019-09-08: 600 mg via INTRAVENOUS
  Filled 2019-09-08: qty 28.57

## 2019-09-08 MED ORDER — ACETAMINOPHEN 325 MG PO TABS
ORAL_TABLET | ORAL | Status: AC
  Filled 2019-09-08: qty 2

## 2019-09-08 MED ORDER — HEPARIN SOD (PORK) LOCK FLUSH 100 UNIT/ML IV SOLN
500.0000 [IU] | Freq: Once | INTRAVENOUS | Status: AC | PRN
  Administered 2019-09-08: 500 [IU]
  Filled 2019-09-08: qty 5

## 2019-09-08 MED ORDER — DIPHENHYDRAMINE HCL 25 MG PO CAPS
ORAL_CAPSULE | ORAL | Status: AC
  Filled 2019-09-08: qty 1

## 2019-09-08 MED ORDER — ACETAMINOPHEN 325 MG PO TABS
650.0000 mg | ORAL_TABLET | Freq: Once | ORAL | Status: AC
  Administered 2019-09-08: 650 mg via ORAL

## 2019-09-08 NOTE — Patient Instructions (Signed)
[facility] Discharge Instructions for Patients Receiving Chemotherapy  Today you received the following chemotherapy agents:  trastuzamab  To help prevent nausea and vomiting after your treatment, we encourage you to take your nausea medication as prescribed.   If you develop nausea and vomiting that is not controlled by your nausea medication, call the clinic.   BELOW ARE SYMPTOMS THAT SHOULD BE REPORTED IMMEDIATELY:  *FEVER GREATER THAN 100.5 F  *CHILLS WITH OR WITHOUT FEVER  NAUSEA AND VOMITING THAT IS NOT CONTROLLED WITH YOUR NAUSEA MEDICATION  *UNUSUAL SHORTNESS OF BREATH  *UNUSUAL BRUISING OR BLEEDING  TENDERNESS IN MOUTH AND THROAT WITH OR WITHOUT PRESENCE OF ULCERS  *URINARY PROBLEMS  *BOWEL PROBLEMS  UNUSUAL RASH Items with * indicate a potential emergency and should be followed up as soon as possible.  Feel free to call the clinic should you have any questions or concerns. The clinic phone number is [phone].  Please show the [facility] at check-in to the Emergency Department and triage nurse.  Denosumab injection What is this medicine? DENOSUMAB (den oh sue mab) slows bone breakdown. Prolia is used to treat osteoporosis in women after menopause and in men, and in people who are taking corticosteroids for 6 months or more. Delton See is used to treat a high calcium level due to cancer and to prevent bone fractures and other bone problems caused by multiple myeloma or cancer bone metastases. Delton See is also used to treat giant cell tumor of the bone. This medicine may be used for other purposes; ask your health care provider or pharmacist if you have questions. COMMON BRAND NAME(S): Prolia, XGEVA What should I tell my health care provider before I take this medicine? They need to know if you have any of these conditions:  dental disease  having surgery or tooth extraction  infection  kidney disease  low levels of calcium or Vitamin D in  the blood  malnutrition  on hemodialysis  skin conditions or sensitivity  thyroid or parathyroid disease  an unusual reaction to denosumab, other medicines, foods, dyes, or preservatives  pregnant or trying to get pregnant  breast-feeding How should I use this medicine? This medicine is for injection under the skin. It is given by a health care professional in a hospital or clinic setting. A special MedGuide will be given to you before each treatment. Be sure to read this information carefully each time. For Prolia, talk to your pediatrician regarding the use of this medicine in children. Special care may be needed. For Delton See, talk to your pediatrician regarding the use of this medicine in children. While this drug may be prescribed for children as young as 13 years for selected conditions, precautions do apply. Overdosage: If you think you have taken too much of this medicine contact a poison control center or emergency room at once. NOTE: This medicine is only for you. Do not share this medicine with others. What if I miss a dose? It is important not to miss your dose. Call your doctor or health care professional if you are unable to keep an appointment. What may interact with this medicine? Do not take this medicine with any of the following medications:  other medicines containing denosumab This medicine may also interact with the following medications:  medicines that lower your chance of fighting infection  steroid medicines like prednisone or cortisone This list may not describe all possible interactions. Give your health care provider a list of all the medicines, herbs,  non-prescription drugs, or dietary supplements you use. Also tell them if you smoke, drink alcohol, or use illegal drugs. Some items may interact with your medicine. What should I watch for while using this medicine? Visit your doctor or health care professional for regular checks on your progress. Your  doctor or health care professional may order blood tests and other tests to see how you are doing. Call your doctor or health care professional for advice if you get a fever, chills or sore throat, or other symptoms of a cold or flu. Do not treat yourself. This drug may decrease your body's ability to fight infection. Try to avoid being around people who are sick. You should make sure you get enough calcium and vitamin D while you are taking this medicine, unless your doctor tells you not to. Discuss the foods you eat and the vitamins you take with your health care professional. See your dentist regularly. Brush and floss your teeth as directed. Before you have any dental work done, tell your dentist you are receiving this medicine. Do not become pregnant while taking this medicine or for 5 months after stopping it. Talk with your doctor or health care professional about your birth control options while taking this medicine. Women should inform their doctor if they wish to become pregnant or think they might be pregnant. There is a potential for serious side effects to an unborn child. Talk to your health care professional or pharmacist for more information. What side effects may I notice from receiving this medicine? Side effects that you should report to your doctor or health care professional as soon as possible:  allergic reactions like skin rash, itching or hives, swelling of the face, lips, or tongue  bone pain  breathing problems  dizziness  jaw pain, especially after dental work  redness, blistering, peeling of the skin  signs and symptoms of infection like fever or chills; cough; sore throat; pain or trouble passing urine  signs of low calcium like fast heartbeat, muscle cramps or muscle pain; pain, tingling, numbness in the hands or feet; seizures  unusual bleeding or bruising  unusually weak or tired Side effects that usually do not require medical attention (report to your  doctor or health care professional if they continue or are bothersome):  constipation  diarrhea  headache  joint pain  loss of appetite  muscle pain  runny nose  tiredness  upset stomach This list may not describe all possible side effects. Call your doctor for medical advice about side effects. You may report side effects to FDA at 1-800-FDA-1088. Where should I keep my medicine? This medicine is only given in a clinic, doctor's office, or other health care setting and will not be stored at home. NOTE: This sheet is a summary. It may not cover all possible information. If you have questions about this medicine, talk to your doctor, pharmacist, or health care provider.  2020 [employer] (2018-03-27 16:10:44)

## 2019-09-08 NOTE — Progress Notes (Signed)
Mobile  [phone] [phone]    ID: [name] [name] DOB: [birth year]  MR#: [id number]  YQI#:[id number]  Patient Care Team: Marda Stalker, PA-C as PCP - General (Family Medicine) Jerline Pain, MD as PCP - Cardiology (Cardiology) Magrinat, Virgie Dad, MD as Consulting Physician (Oncology) Loreta Ave, MD as Referring Physician (Internal Medicine) Donato Heinz, MD as Consulting Physician (Nephrology) Kyung Rudd, MD as Consulting Physician (Radiation Oncology) Jovita Kussmaul, MD as Consulting Physician (General Surgery) Larey Dresser, MD as Consulting Physician (Cardiology) Mauro Kaufmann, RN as Oncology Nurse Navigator Rockwell Germany, RN as Oncology Nurse Navigator OTHER MD:   CHIEF COMPLAINT: Triple positive breast cancer  CURRENT TREATMENT: Trastuzumab; anastrozole; to start Xgeva   HISTORY OF CURRENT ILLNESS: From the original intake note:  [name] [name] palpated a lump on 01/15/2018. She felt soreness and shooting pain under her arm and in the right breast. She underwent bilateral diagnostic mammography with tomography and right breast ultrasonography at [facility] on 03/12/2018 showing: breast density category C. The is architectural distortion at the 11 o'clock position. There is also an oval mass in the right breast anterior depth. Examination of the right axilla showed enlarged lymph nodes. Ultrasonography showed a 4.6 cm mass in the right breast upper outer quadrant posterior depth. An additional 1.2 cm oval mass in the right breast 12 o'clock middle depth. The lymph nodes in the right axillary are highly suggestive of malignancy.   Accordingly on 03/16/2018 she proceeded to biopsy of the right breast area in question. The pathology from this procedure showed ([id number]): At both the 11 and 12 o'clock positions: Invasive ductal carcinoma grade II. Ductal carcinoma in situ, high grade, with necrosis and calcifications.  Prognostic indicators significant for: estrogen receptor, 90% positive and progesterone receptor, 40% positive, both with strong staining intensity. Proliferation marker Ki67 at 80%. HER2 amplified with ratios HER2/CEP17 SIGNALS 6.90 and average HER2 copies per cell 14.50  The patient's subsequent history is as detailed below.   INTERVAL HISTORY: [name] was seen today for follow-up and treatment of her triple positive breast cancer.  She continues on trastuzumab, which she receives every 28 days.  Tolerates this with no side effects that she is aware of.    [name]'s last echocardiogram on 07/15/2019 that showed an EF of 60-65%.  She sees Dr. Haroldine Laws.  She has a repeat echocardiogram scheduled on 10/18/2019.  She also continues on anastrozole. She takes gabapentin for hot flashes which helps, she uses coconut oil daily to help with this.  She has stopped taking Tramadol since she is no longer in pain.    [name] is due to start [place].  This was delayed due to dental extractions.  She had 7 teeth removed and a bone graft on 07/30/2019 with Dr. Westly Pam.  She says she will undergo deep cleaning and bridge in 10/2019.  We received verbal clearance from Dr. Maxwell Caul today to proceed with Delton See.    REVIEW OF SYSTEMS: [name] has some mild back aches x 2 weeks.  No numbness or tingling.  She no longer needs the gabapentin.  Her peripheral neuropathy has resolved.  She continues to take a multivitamin.  She wants to know if she can take a break in December from her Trastuzumab.  She is fatigued.  She says she feels worse at 2pm every day.  She is exercising regularly with her 48 year old doing various activities.    [name] denies any fever or chills.  She  is without nausea, vomiting, bowel/bladder changes, dysphagia, headaches, unintentional weight loss, lymphadenopathy, cough, shortness of breath, chest pain, or palpitations.  A detailed ROS was otherwise non contributory.     PAST MEDICAL  HISTORY: Past Medical History:  Diagnosis Date  . Anxiety   . Breast cancer, left breast ([place]) 2000   Underwent lumpectomy, chemotherapy and radiation  . Facial paralysis/Bells palsy    left side  . Family history of lung cancer   . Family history of lymphoma   . Hypertension   . Neuropathy    IN FINGERS AND TOES   RECENT INFUSION OF CHEMO  . Personal history of breast cancer 05/06/2018  . Renal disorder    just after TIA acute kidney injury  . TIA (transient ischemic attack) 03/16/2017   [facility]   The patient has a previous history of left breast cancer diagnosed in 2000. She was seen by [facility] in [place], [place] under Dr. Loreta Ave. According to the patient, her left breast cancer was stage II with no lymph node involvement (likely T2N0). She had chemotherapy every 3 weeks for about 6 months. She did not takes any "red drugs." She also did not take anti-estrogens (likely ER/PR negative).   TIA in 2018. She saw a neurologist as a precaution. She denies any clotting issues.    PAST SURGICAL HISTORY: Past Surgical History:  Procedure Laterality Date  . BREAST LUMPECTOMY WITH AXILLARY LYMPH NODE BIOPSY Left 2000   biopsy  . BREAST SURGERY     partial mastectomy with lymph node removal  . MASTECTOMY MODIFIED RADICAL Right 08/26/2018   Procedure: MASTECTOMY MODIFIED RADICAL;  Surgeon: Jovita Kussmaul, MD;  Location: [place];  Service: General;  Laterality: Right;  . MODIFIED MASTECTOMY Right 08/26/2018  . PORTACATH PLACEMENT Left 04/08/2018   Procedure: INSERTION PORT-A-CATH;  Surgeon: Jovita Kussmaul, MD;  Location: [place];  Service: General;  Laterality: Left;  . TUBAL LIGATION Bilateral 2004  . WISDOM TOOTH EXTRACTION      FAMILY HISTORY Family History  Problem Relation Age of Onset  . Hypertension Mother   . Hypertension Father   . Diverticulosis Father   . Diabetes Father   . Lung cancer Father 83       hx smoking  . Hypertension Maternal  Grandmother   . CAD Maternal Grandmother   . Alzheimer's disease Maternal Grandmother        died at 6  . Hypertension Paternal Grandmother   . CAD Paternal Grandmother   . Stroke Paternal Grandfather   . Sickle cell trait Paternal Grandfather   . Pneumonia Maternal Aunt        died at a young adult  . Lymphoma Paternal Aunt 24   The patient's father is alive at age 32 and had a history of lung cancer. The patient's mother is alive at age 55. The patient has 1 brother and no sisters. She denies a family history of breast or ovarian cancer.     GYNECOLOGIC HISTORY:  No LMP recorded. (Menstrual status: Chemotherapy). Menarche: 48 years old Age at first live birth: 48 years old She is GXP3. She is no longer having periods, which were irregular and light. Her LMP was  January 2019. She is not having hot flashes. The patient used oral contraceptive from [phone] and the Depo-provera shot from [phone] with no complications. She never used HRT.    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 Her husband,  [name], is a Administrator. The patient's son, Shea Stakes age 64, works at [employer] in [place]. The patient's daughters, Lilia Pro age 87, and Levonne Spiller age 12, are students. The patient's adopted son, Vonna Kotyk age 45, is also a Ship broker.     ADVANCED DIRECTIVES:    HEALTH MAINTENANCE: Social History   Tobacco Use  . Smoking status: Never Smoker  . Smokeless tobacco: Never Used  Substance Use Topics  . Alcohol use: No  . Drug use: No     Colonoscopy: n/a  PAP: 2015  Bone density: never done   Allergies  Allergen Reactions  . Ace Inhibitors Swelling    Swelling of lips and face  . Lisinopril Swelling    Swelling of lips, face  . Amlodipine Swelling    Leg swelling  . Adhesive [Tape] Itching and Rash    Please use paper tape  . Latex Itching and Rash    Current Outpatient Medications  Medication Sig Dispense Refill  . anastrozole (ARIMIDEX) 1 MG  tablet Take 1 tablet (1 mg total) by mouth daily. 90 tablet 4  . aspirin EC 81 MG tablet Take 81 mg by mouth daily.    . carvedilol (COREG) 3.125 MG tablet Take 1 tablet (3.125 mg total) by mouth 2 (two) times daily with a meal. 60 tablet 6  . Cyanocobalamin (VITAMIN B12) 500 MCG TABS Take 500 mcg by mouth daily.     . ferrous sulfate 325 (65 FE) MG tablet Take 1 tablet (325 mg total) by mouth daily. 30 tablet 0  . gabapentin (NEURONTIN) 300 MG capsule Take 1 capsule (300 mg total) by mouth at bedtime. 90 capsule 4  . lidocaine-prilocaine (EMLA) cream Apply 1 application topically as needed.    Marland Kitchen losartan (COZAAR) 50 MG tablet Take 1 tablet (50 mg total) by mouth daily. 30 tablet 11  . Multiple Vitamin (MULITIVITAMIN WITH MINERALS) TABS Take 1 tablet by mouth daily.      Marland Kitchen spironolactone-hydrochlorothiazide (ALDACTAZIDE) 25-25 MG tablet TAKE 1 TABLET BY MOUTH EVERY DAY 30 tablet 2  . venlafaxine XR (EFFEXOR-XR) 37.5 MG 24 hr capsule Take 1 capsule (37.5 mg total) by mouth daily with breakfast. 30 capsule 5   No current facility-administered medications for this visit.     OBJECTIVE:  Vitals:   09/08/19 0850  BP: (!) 119/51  Pulse: 83  Resp: 18  Temp: 98 F (36.7 C)  SpO2: 100%     Body mass index is 45.34 kg/m.   Wt Readings from Last 3 Encounters:  09/08/19 224 lb 8 oz (101.8 kg)  08/18/19 225 lb 9.6 oz (102.3 kg)  08/11/19 223 lb 9.6 oz (101.4 kg)  ECOG FS:1 - Symptomatic but completely ambulatory GENERAL: Patient is a well appearing female in no acute distress HEENT:  Sclerae anicteric.  Oropharynx clear and moist. No ulcerations or evidence of oropharyngeal candidiasis. Neck is supple.  NODES:  No cervical, supraclavicular, or axillary lymphadenopathy palpated.  BREAST EXAM:  deferred LUNGS:  Clear to auscultation bilaterally.  No wheezes or rhonchi. HEART:  Regular rate and rhythm. No murmur appreciated. ABDOMEN:  Soft, nontender.  Positive, normoactive bowel sounds. No  organomegaly palpated. MSK:  No focal spinal tenderness to palpation. Full range of motion bilaterally in the upper extremities. EXTREMITIES:  Bilateral upper extremity swelling present SKIN:  Clear with no obvious rashes or skin changes. No nail dyscrasia. NEURO:  Nonfocal. Well oriented.  Appropriate affect.     LAB RESULTS:  CMP     Component  Value Date/Time   NA 141 08/11/2019 0836   K 4.2 08/11/2019 0836   CL 107 08/11/2019 0836   CO2 25 08/11/2019 0836   GLUCOSE 110 (H) 08/11/2019 0836   BUN 26 (H) 08/11/2019 0836   CREATININE 1.48 (H) 08/11/2019 0836   CREATININE 1.36 (H) 05/19/2019 0832   CALCIUM 11.1 (H) 08/11/2019 0836   CALCIUM 11.3 (H) 08/11/2019 0836   PROT 7.6 08/11/2019 0836   ALBUMIN 3.7 08/11/2019 0836   AST 20 08/11/2019 0836   AST 19 05/19/2019 0832   ALT 32 08/11/2019 0836   ALT 24 05/19/2019 0832   ALKPHOS 119 08/11/2019 0836   BILITOT 0.3 08/11/2019 0836   BILITOT 0.3 05/19/2019 0832   GFRNONAA 42 (L) 08/11/2019 0836   GFRNONAA 46 (L) 05/19/2019 0832   GFRAA 48 (L) 08/11/2019 0836   GFRAA 54 (L) 05/19/2019 0832    Lab Results  Component Value Date   TOTALPROTELP 7.0 02/25/2019     Lab Results  Component Value Date   KPAFRELGTCHN 76.7 (H) 02/25/2019   LAMBDASER 27.2 (H) 02/25/2019   KAPLAMBRATIO 2.82 (H) 02/25/2019    Lab Results  Component Value Date   WBC 4.2 09/08/2019   NEUTROABS 2.7 09/08/2019   HGB 11.2 (L) 09/08/2019   HCT 33.6 (L) 09/08/2019   MCV 84.2 09/08/2019   PLT 161 09/08/2019    '@LASTCHEMISTRY'$ @  No results found for: LABCA2  No components found for: XQJJHE174  No results for input(s): INR in the last 168 hours.  No results found for: LABCA2  No results found for: CAN199  No results found for: YCX448  No results found for: JEH631  Lab Results  Component Value Date   CA2729 38.7 (H) 02/25/2019    No components found for: HGQUANT  No results found for: CEA1 / No results found for: CEA1   No  results found for: AFPTUMOR  No results found for: [place]  No results found for: PSA1  Appointment on 09/08/2019  Component Date Value Ref Range Status  . WBC 09/08/2019 4.2  4.0 - 10.5 K/uL Final  . RBC 09/08/2019 3.99  3.87 - 5.11 MIL/uL Final  . Hemoglobin 09/08/2019 11.2* 12.0 - 15.0 g/dL Final  . HCT 09/08/2019 33.6* 36.0 - 46.0 % Final  . MCV 09/08/2019 84.2  80.0 - 100.0 fL Final  . MCH 09/08/2019 28.1  26.0 - 34.0 pg Final  . MCHC 09/08/2019 33.3  30.0 - 36.0 g/dL Final  . RDW 09/08/2019 14.9  11.5 - 15.5 % Final  . Platelets 09/08/2019 161  150 - 400 K/uL Final  . nRBC 09/08/2019 0.0  0.0 - 0.2 % Final  . Neutrophils Relative % 09/08/2019 65  % Final  . Neutro Abs 09/08/2019 2.7  1.7 - 7.7 K/uL Final  . Lymphocytes Relative 09/08/2019 19  % Final  . Lymphs Abs 09/08/2019 0.8  0.7 - 4.0 K/uL Final  . Monocytes Relative 09/08/2019 12  % Final  . Monocytes Absolute 09/08/2019 0.5  0.1 - 1.0 K/uL Final  . Eosinophils Relative 09/08/2019 2  % Final  . Eosinophils Absolute 09/08/2019 0.1  0.0 - 0.5 K/uL Final  . Basophils Relative 09/08/2019 1  % Final  . Basophils Absolute 09/08/2019 0.0  0.0 - 0.1 K/uL Final  . Immature Granulocytes 09/08/2019 1  % Final  . Abs Immature Granulocytes 09/08/2019 0.02  0.00 - 0.07 K/uL Final   Performed at [facility], [place] [address], [place], [place] [address]    (  this displays the last labs from the last 3 days)  Lab Results  Component Value Date   TOTALPROTELP 7.0 02/25/2019   (this displays SPEP labs)  Lab Results  Component Value Date   KPAFRELGTCHN 76.7 (H) 02/25/2019   LAMBDASER 27.2 (H) 02/25/2019   KAPLAMBRATIO 2.82 (H) 02/25/2019   (kappa/lambda light chains)  No results found for: HGBA, HGBA2QUANT, HGBFQUANT, HGBSQUAN (Hemoglobinopathy evaluation)   No results found for: LDH  No results found for: IRON, TIBC, IRONPCTSAT (Iron and TIBC)  No results found for: FERRITIN  Urinalysis     Component Value Date/Time   COLORURINE YELLOW 08/18/2019 [place] 08/18/2019 1420   LABSPEC 1.016 08/18/2019 1420   PHURINE 5.0 08/18/2019 1420   GLUCOSEU NEGATIVE 08/18/2019 1420   HGBUR NEGATIVE 08/18/2019 1420   BILIRUBINUR NEGATIVE 08/18/2019 1420   KETONESUR NEGATIVE 08/18/2019 1420   PROTEINUR NEGATIVE 08/18/2019 1420   UROBILINOGEN 0.2 11/29/2011 1656   NITRITE NEGATIVE 08/18/2019 1420   LEUKOCYTESUR SMALL (A) 08/18/2019 1420     STUDIES: Ct Chest W Contrast  Result Date: 09/01/2019 CLINICAL DATA:  Metastatic breast cancer, recurrent, evaluate EXAM: CT CHEST WITH CONTRAST TECHNIQUE: Multidetector CT imaging of the chest was performed during intravenous contrast administration. CONTRAST:  69m OMNIPAQUE IOHEXOL 300 MG/ML  SOLN COMPARISON:  PET-CT, 03/11/2019, MR thoracic spine, 05/03/2019, CT chest, 02/04/2019, CT chest, 04/03/2018 FINDINGS: Cardiovascular: Left chest port catheter. Mild cardiomegaly. No pericardial effusion. Mediastinum/Nodes: No enlarged mediastinal, hilar, or axillary lymph nodes. Thyroid gland, trachea, and esophagus demonstrate no significant findings. Lungs/Pleura: Subpleural radiation fibrosis of the anterior right lung. Bandlike scarring of the lung bases. There is a new, 3 mm pulmonary nodule of the right lung base (series 7, image 22). New 3 mm pulmonary nodule of the left upper lobe (series 7, image 58). Additional stable, benign 2 mm nodule of the right lung base (series 7, image 9). No pleural effusion or pneumothorax. Upper Abdomen: No acute abnormality. Musculoskeletal: Redemonstrated postoperative findings of right mastectomy with thickening of the overlying skin, asymmetry and thickening of the right pectoralis major muscle, and spiculated soft tissue about the lateral margin of the pectoralis major and right axilla with adjacent surgical clips (series 2, image 63). Additional surgical clips in the central left breast and left axilla.  Interval increase in lytic cortical destruction of a metastatic lesion of the right posterior elements of the T3 vertebral body, without obvious epidural soft tissue mass on CT (series 2, image 27). Increase in lytic destruction of a metastatic lesion of the spinous process of T5 (series 7, image 52). New lytic lesion of the posterior T10 vertebral body (series 6, image 75). Unchanged superior endplate sclerosis of T6 (series 6, image 86). Increase in sclerosis about a lesion of the right aspect of the manubrium (series 7, image 29). IMPRESSION: 1. Redemonstrated postoperative findings of right mastectomy with thickening of the overlying skin, asymmetry and thickening of the right pectoralis major muscle, and spiculated soft tissue about the lateral margin of the pectoralis major and right axilla with adjacent surgical clips (series 2, image 63). Correlate with breast imaging evaluation for assessment of residual local disease. 2. Interval increase in lytic cortical destruction of a metastatic lesion of the right posterior elements of the T3 vertebral body, without obvious epidural soft tissue mass on CT (series 2, image 27). Increase in lytic destruction of a metastatic lesion of the spinous process of T5 (series 7, image 52). New lytic lesion of the posterior T10 vertebral  body (series 6, image 75). Unchanged superior endplate sclerosis of T6 (series 6, image 86). Increase in sclerosis about a lesion of the right aspect of the manubrium (series 7, image 29). Findings are consistent with worsened osseous metastatic disease. Please see forthcoming thoracic spine MRI. 3. There is a new, 3 mm pulmonary nodule of the right lung base (series 7, image 22). New 3 mm pulmonary nodule of the left upper lobe (series 7, image 58). These nodules are nonspecific; early metastatic disease is not excluded. Attention on follow-up. Electronically Signed   By: Eddie Candle M.D.   On: 09/01/2019 14:30   Mr Thoracic Spine W Wo  Contrast  Result Date: 09/03/2019 CLINICAL DATA:  Metastatic breast cancer. EXAM: MRI THORACIC WITHOUT AND WITH CONTRAST TECHNIQUE: Multiplanar and multiecho pulse sequences of the thoracic spine were obtained without and with intravenous contrast. CONTRAST:  52m GADAVIST GADOBUTROL 1 MMOL/ML IV SOLN COMPARISON:  CT chest 09/01/2019.  MRI thoracic spine 05/03/2019. FINDINGS: MRI THORACIC SPINE FINDINGS Alignment:  Stable mild scoliosis. Vertebrae: Multifocal osseous metastatic disease is again demonstrated, better visualized with the aid contrast on the current examination. The largest lesion involves the posterior elements on the right at T3, extending into the right articulating facet. There is a lesion involving the T5 spinous process. These lesions have not significantly changed. Small lesions posteriorly in the T10 and T10 vertebral body show enhancement and appears slightly larger. No evidence of pathologic fracture or epidural tumor. Stable small Schmorl's node involving the superior endplate of T6. Cord: The thoracic cord appears normal in signal and caliber. No abnormal intradural enhancement. Paraspinal and other soft tissues: No significant paraspinal abnormalities. Disc levels: No significant disc herniation, spinal stenosis or nerve root encroachment. IMPRESSION: 1. Multifocal osseous metastatic disease appears mildly progressive compared with the previous MRI 4 months ago. However, the dominant lesions involving the posterior elements at T3 and T5 have not significantly changed, and there is no evidence of pathologic fracture or epidural tumor. 2. No abnormal intradural enhancement, cord deformity or significant spondylosis. Electronically Signed   By: WRichardean SaleM.D.   On: 09/03/2019 08:21   Nm Bone Scan Whole Body  Result Date: 09/02/2019 CLINICAL DATA:  Metastatic RIGHT breast cancer to bone EXAM: NUCLEAR MEDICINE WHOLE BODY BONE SCAN TECHNIQUE: Whole body anterior and posterior images  were obtained approximately 3 hours after intravenous injection of radiopharmaceutical. RADIOPHARMACEUTICALS:  20.2 mCi Technetium-960mDP IV COMPARISON:  04/03/2018. Correlation: MR thoracic spine 08/30/2019, CT chest 09/01/2019 FINDINGS: Abnormal increased tracer localization identified at the RIGHT lateral manubrium and at several levels in the upper thoracic spine, approximately T6 and T3. Additional uptake at the posterior RIGHT fourth rib medially. CT scan demonstrates sclerotic metastasis in the T6 vertebral body and a lytic lesion within the posterior elements of T3. No additional worrisome sites of tracer uptake seen to suggest additional metastases. Minimal uptake at the knees and RIGHT lateral aspect of lower lumbar spine typically degenerative. Expected urinary tract and soft tissue distribution of tracer. IMPRESSION: Osseous metastases involving T6 and T3 vertebral bodies, RIGHT manubrium, and likely posteromedial RIGHT fourth rib. Electronically Signed   By: MaLavonia Dana.D.   On: 09/02/2019 09:06    ELIGIBLE FOR AVAILABLE RESEARCH PROTOCOL: no   ASSESSMENT: [id number]. [place]  (1) status post left lumpectomy in 2000 for a (?) T2N0 breast cancer,  (a) status post adjuvant chemotherapy  (b) status post adjuvant radiation  (c) did not receive antiestrogens  (2) genetics  testing May 18, 2018 through the common Hereditary Cancers Panel + Myelodysplastic Syndrome/Leukemia Panel found no deleterious mutations in APC, ATM, AXIN2, BARD1, BLM, BMPR1A, BRCA1, BRCA2, BRIP1, CDH1, CDK4, CDKN2A (p14ARF), CDKN2A (p16INK4a), CEBPA, CHEK2, CTNNA1, DICER1, EPCAM*, GATA2, GREM1*, HRAS, KIT, MEN1, MLH1, MSH2, MSH3, MSH6, MUTYH, NBN, NF1, PALB2, PDGFRA, PMS2, POLD1, POLE, PTEN, RAD50, RAD51C, RAD51D, RUNX1, SDHB, SDHC, SDHD, SMAD4, SMARCA4, STK11, TERC, TERT, TP53, TSC1, TSC2, VHL The following genes were evaluated for sequence changes only: HOXB13*, NTHL1*, SDHA  (a)  3 variants of uncertain  significance were identified in the genes BARD1 c.764A>G (p.Asn255Ser), BRIP1 c.2563C>T (p.Arg855Cys), and PALB2 c.3103A>T (p.Ile1035Phe).   (3) status post right breast biopsy 03/16/2018 for a clinical mT2-3 N2, anatomic stage III invasive ductal carcinoma, grade 2, triple positive, with an MIB-1 of 80%.  (a) bone scan and chest CT scan negative for metastases except for a T6 lytic lesion of concern  (b) thoracic spine MRI was ordered May 2019 but never performed.  (4) neoadjuvant chemotherapy consisting of carboplatin, docetaxel, trastuzumab and Pertuzumab every 3 weeks x 6 starting 04/16/2018  (a) pertuzumab held beginning with cycle 2 because of diarrhea  (5) trastuzumab to be continued Q4w indefinitely given diagnosis of stage IV disease below  (a) echocardiogram on 06/29/2018 showed an ejection fraction in the 65-70% range  (b) echocardiogram 11/19/2018 showed an ejection fraction in the 60-65% range  (c) echo 04/07/2019 shows an ejection fraction in the 55-60% range  (d) echo 07/15/2019 shows EF of 60-65%  (6) right mastectomy and sentinel lymph node sampling on 08/26/2018 found a residual 2.5cm invasive ductal carcinoma, with 2 of 5 sentinel lymph nodes positive, ypT2, ypN1a; margins were clear  (7) adjuvant radiation 09/30/2018 - 11/16/2018 Site/dose:   The patient initially received a dose of 50.4 Gy in 28 fractions to the right chest wall and supraclavicular region. This was delivered using a 3-D conformal, 4 field technique. The patient then received a boost to the mastectomy scar. This delivered an additional 10 Gy in 5 fractions using an en face electron field. The total dose was 60.4   (8) anastrozole started 12/03/2018  (a) [place] on 10/01/2018 was 64.3, and estradiol 7.0, consistent with menopause  (b) referral to pelvic floor rehabilitation 12/03/2018  METASTATIC DISEASE: March 2020 (9) CT of neck and CT angiography of the chest 02/04/2019 finds the T6 lytic lesion noted May  2019 (see #3 above) is now sclerotic; a sclerotic T3 lesion is stable; there is a new lytic lesion in the manubrium  (a) PET scan 03/12/2019 shows manubrium, T3 and T5 metastases, no visceral disease  (b) biopsy of manubrial lesion planned but not done secondary to pandemic  (c) Bone scan 09/01/19 shows ? Progression of disease in spine, MRI 09/02/19 shows early metastases in T10 (new), T3 and T5 stable, CT chest 09/01/19 shows 52m pulmonary nodules that are new, but non specific and warrant future follow up  (10) SRS to spine, palliative treatment to sternum, from 05/13/2019 through 05/26/2019: 1. Chest_sternum // 40 Gy in 10 fractions  2. Thoracic Spine (T3) // 18 Gy in 1 fraction  3. Thoracic Spine (T5) // 18 Gy in 1 fraction   PLAN: KBurundiis doing well today.  We reviewed her scans which show small pulmonary nodules, at 356mthat are too small to determine exactly what is going on.  I let her know that I reviewed them with Dr. MaJana Hakimwho wants to monitor these closely with CT chest in 3-6 months.  Maansi's bones continue to have metastases, and there is concern for progression in her bone metastases in T10.  She had undergone 7 dental extractions along with a bone graft.  She notes she has healed well, and we received clearance from Dr. Maxwell Caul, her dentist to proceed with Delton See today.   I reviewed the above with Dr. Jana Hakim.  Considering all of the above information, [name] will continue on Anastrozole daily, Trastuzumab every 4 weeks, and Xgeva starting today given every 4 weeks.  [name] knows that the Delton See will lower her calcium level, however she is not recommended to take additional calcium due to this, because her calcium level has remained borderline elevated.  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  She understands this and is willing to proceed. She and I have reviewed the mechanism of action of the Xgeva in detail and she understands why she needs to proceed  with treatment today.  [name] and I talked through her schedule.  We discussed her wanting some time off in December.  She is going to take an extra week in December, and be treated the first week of December and then the first week of January which will stretch the Trastuzumab x 5 weeks for one occasion.  After that she will return to every 4 weeks treatment.  She will be restaged in 3-6 months or sooner if she develops any symptoms of progression.    We reviewed healthy diet and exercise today.  I encouraged her to continue to do this.  We discussed stress reduction with her son while he is doing virtual learning from home while she is working from home.    [name] will return in 4 weeks for labs, f/u and her next treatment.  She was recommended to continue with the appropriate pandemic precautions. She knows to call for any questions that may arise between now and her next appointment.  We are happy to see her sooner if needed.  A total of (30) minutes of face-to-face time was spent with this patient with greater than 50% of that time in counseling and care-coordination.   Wilber Bihari, NP  09/08/19 8:53 AM Medical Oncology and Hematology [facility] [place], [place] [address] Tel. [phone]    Fax. [phone]

## 2019-09-08 NOTE — Telephone Encounter (Signed)
This nurse spoke with [name] [name], Dr. Nancee Liter assistant at [facility] concerning whether or not patient can begin xgeva.  Per Dr. Maxwell Caul it is ok (patient cleared) to start xgeva and that patients further treatments should not interfere with injection.

## 2019-09-09 ENCOUNTER — Telehealth: Payer: Self-pay

## 2019-09-09 ENCOUNTER — Telehealth: Payer: Self-pay | Admitting: Adult Health

## 2019-09-09 LAB — FOLLICLE STIMULATING HORMONE: FSH: 74.4 m[IU]/mL

## 2019-09-09 NOTE — Telephone Encounter (Signed)
Called pt to set up possible evisit with JM on 09/13/2019, left message asking pt to call the office.

## 2019-09-09 NOTE — Telephone Encounter (Signed)
I left a message regarding schedule  

## 2019-09-10 ENCOUNTER — Other Ambulatory Visit: Payer: Self-pay | Admitting: Medical

## 2019-09-10 LAB — ESTRADIOL, ULTRA SENS: Estradiol, Sensitive: <2.5 pg/mL

## 2019-09-14 NOTE — Telephone Encounter (Signed)
May she have 90-day supply for future refills?

## 2019-09-16 ENCOUNTER — Other Ambulatory Visit: Payer: Self-pay | Admitting: Oncology

## 2019-09-19 ENCOUNTER — Other Ambulatory Visit: Payer: Self-pay | Admitting: Oncology

## 2019-09-29 ENCOUNTER — Telehealth: Payer: Self-pay

## 2019-09-29 NOTE — Telephone Encounter (Signed)
Patient called in to say her daughter tested positive for Covid on Tuesday and is in isolation for 10 days.  The daughter has been with patient for the last few days.  Her appointment for infusion is 11/4 and concerned about what she needs to do.    Nurse spoke with LCC/NP and it is advised for patient to go a couple of days before her treatment to test for Covid.  If negative she is ok to come in for treatment.  Patient reports that she fell a couple of days ago and hurt her right side where she had radiation.  She is currently taking Tylenol and Aleve and putting frozen peas on the site.  Per LCC/NP patient should continue and if she notices swelling, worse pain or other symptoms she should go to urgent care or ER.  Patient voiced understanding of above.

## 2019-09-29 NOTE — Telephone Encounter (Signed)
I did speak with about that.  Don't know why I didn't chart it.  She knows to call if she is symptomatic.

## 2019-09-29 NOTE — Telephone Encounter (Signed)
Hey lori.  If her testing is negative and so long as she remains asymptomatic she can come in on 1/14.

## 2019-10-04 ENCOUNTER — Other Ambulatory Visit: Payer: Self-pay | Admitting: *Deleted

## 2019-10-04 DIAGNOSIS — Z20822 Contact with and (suspected) exposure to covid-19: Secondary | ICD-10-CM

## 2019-10-05 ENCOUNTER — Telehealth: Payer: Self-pay | Admitting: *Deleted

## 2019-10-05 LAB — NOVEL CORONAVIRUS, NAA: SARS-CoV-2, NAA: NOT DETECTED

## 2019-10-05 NOTE — Telephone Encounter (Signed)
Per Wilber Bihari NP, called pt regarding rescheduling due to covid-19 exposure from family member. Pt did take covid-19 test and will call once test has results. Pt verbalized understanding.

## 2019-10-06 ENCOUNTER — Inpatient Hospital Stay: Payer: BC Managed Care – PPO

## 2019-10-06 ENCOUNTER — Inpatient Hospital Stay: Payer: BC Managed Care – PPO | Admitting: Adult Health

## 2019-10-13 ENCOUNTER — Telehealth: Payer: Self-pay | Admitting: Oncology

## 2019-10-13 NOTE — Telephone Encounter (Signed)
Called pt per 11/3 sch message - confirmed next appt for 11/19 . Pt is aware of appt date and time

## 2019-10-17 ENCOUNTER — Other Ambulatory Visit: Payer: Self-pay | Admitting: Oncology

## 2019-10-18 ENCOUNTER — Ambulatory Visit (HOSPITAL_COMMUNITY): Admission: RE | Admit: 2019-10-18 | Payer: BC Managed Care – PPO | Source: Ambulatory Visit

## 2019-10-18 ENCOUNTER — Encounter (HOSPITAL_COMMUNITY): Payer: BC Managed Care – PPO | Admitting: Internal Medicine

## 2019-10-20 NOTE — Progress Notes (Signed)
[facility]  [phone] [phone]    ID: [name] [name] DOB: 29-Sep-1971  MR#: [id number]  VFI#:[id number]  Patient Care Team: Marda Stalker, PA-C as PCP - General (Family Medicine) Jerline Pain, MD as PCP - Cardiology (Cardiology) Magrinat, Virgie Dad, MD as Consulting Physician (Oncology) Loreta Ave, MD as Referring Physician (Internal Medicine) Donato Heinz, MD as Consulting Physician (Nephrology) Kyung Rudd, MD as Consulting Physician (Radiation Oncology) Jovita Kussmaul, MD as Consulting Physician (General Surgery) Larey Dresser, MD as Consulting Physician (Cardiology) Mauro Kaufmann, RN as Oncology Nurse Navigator Rockwell Germany, RN as Oncology Nurse Navigator OTHER MD:   CHIEF COMPLAINT: 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  She continues on trastuzumab.  She receives this every 28 days.  There has been a delay of because of the pandemic.  She will receive a dose today.  She has no symptoms or complications related to this that she is aware of. Patra's last echocardiogram on 07/15/2019 that showed an EF of 60-65%.  She sees Dr. Haroldine Laws.  She was scheduled for repeat echocardiogram on 10/18/2019, but she was unable to get there because her car was in the shop.  She is calling to reschedule.  She also continues on anastrozole.  Her hot flashes have significantly improved.  She does not have a bone density screening on file but she is receiving denosumab/Xgeva, which will more than compensate for any problems with anastrozole.  Incidentally she tolerated her first Xgeva dose last month with no complications  REVIEW OF SYSTEMS: [name] is having some pelvic discomfort, semispasms, and flow consistent with a yeast infection.  She is not having any dysuria  hematuria or urgency.  She has had no fever.  She denies any unusual headaches visual changes cough phlegm production or pleurisy.  She denies any back pain.  A detailed review of systems today was otherwise stable.   HISTORY OF CURRENT ILLNESS: From the original intake note:  [name] [name] palpated a lump on 01/15/2018. She felt soreness and shooting pain under her arm and in the right breast. She underwent bilateral diagnostic mammography with tomography and right breast ultrasonography at [facility] on 03/12/2018 showing: breast density category C. The is architectural distortion at the 11 o'clock position. There is also an oval mass in the right breast anterior depth. Examination of the right axilla showed enlarged lymph nodes. Ultrasonography showed a 4.6 cm mass in the right breast upper outer quadrant posterior depth. An additional 1.2 cm oval mass in the right breast 12 o'clock middle depth. The lymph nodes in the right axillary are highly suggestive of malignancy.   Accordingly on 03/16/2018 she proceeded to biopsy of the right breast area in question. The pathology from this procedure showed ([id number]): At both the 11 and 12 o'clock positions: Invasive ductal carcinoma grade II. Ductal carcinoma in situ, high grade, with necrosis and calcifications. Prognostic indicators significant for: estrogen receptor, 90% positive and progesterone receptor, 40% positive, both with strong staining intensity. Proliferation marker Ki67 at 80%. HER2 amplified with ratios HER2/CEP17 SIGNALS 6.90 and average HER2 copies per cell 14.50  The patient's subsequent history is as detailed below.   PAST MEDICAL HISTORY: Past Medical History:  Diagnosis Date  . Anxiety   . Breast cancer, left breast ([place]) 2000   Underwent lumpectomy, chemotherapy and radiation  . Facial paralysis/Bells  palsy    left side  . Family history of lung cancer   . Family history of lymphoma   . Hypertension   . Neuropathy    IN  FINGERS AND TOES   RECENT INFUSION OF CHEMO  . Personal history of breast cancer 05/06/2018  . Renal disorder    just after TIA acute kidney injury  . TIA (transient ischemic attack) 03/16/2017   [facility]   The patient has a previous history of left breast cancer diagnosed in 2000. She was seen by [facility] in [place], [place] under Dr. Loreta Ave. According to the patient, her left breast cancer was stage II with no lymph node involvement (likely T2N0). She had chemotherapy every 3 weeks for about 6 months. She did not takes any "red drugs." She also did not take anti-estrogens (likely ER/PR negative).   TIA in 2018. She saw a neurologist as a precaution. She denies any clotting issues.    PAST SURGICAL HISTORY: Past Surgical History:  Procedure Laterality Date  . BREAST LUMPECTOMY WITH AXILLARY LYMPH NODE BIOPSY Left 2000   biopsy  . BREAST SURGERY     partial mastectomy with lymph node removal  . MASTECTOMY MODIFIED RADICAL Right 08/26/2018   Procedure: MASTECTOMY MODIFIED RADICAL;  Surgeon: Jovita Kussmaul, MD;  Location: [facility];  Service: General;  Laterality: Right;  . MODIFIED MASTECTOMY Right 08/26/2018  . PORTACATH PLACEMENT Left 04/08/2018   Procedure: INSERTION PORT-A-CATH;  Surgeon: Jovita Kussmaul, MD;  Location: [facility];  Service: General;  Laterality: Left;  . TUBAL LIGATION Bilateral 2004  . WISDOM TOOTH EXTRACTION      FAMILY HISTORY Family History  Problem Relation Age of Onset  . Hypertension Mother   . Hypertension Father   . Diverticulosis Father   . Diabetes Father   . Lung cancer Father 71       hx smoking  . Hypertension Maternal Grandmother   . CAD Maternal Grandmother   . Alzheimer's disease Maternal Grandmother        died at 35  . Hypertension Paternal Grandmother   . CAD Paternal Grandmother   . Stroke Paternal Grandfather   . Sickle cell trait Paternal Grandfather   . Pneumonia Maternal Aunt        died at a young adult  .  Lymphoma Paternal Aunt 51   The patient's father is alive at age 30 and had a history of lung cancer. The patient's mother is alive at age 13. The patient has 1 brother and no sisters. She denies a family history of breast or ovarian cancer.     GYNECOLOGIC HISTORY:  No LMP recorded. (Menstrual status: Chemotherapy). Menarche: 48 years old Age at first live birth: 48 years old She is GXP3. She is no longer having periods, which were irregular and light. Her LMP was  January 2019. She is not having hot flashes. The patient used oral contraceptive from [phone] and the Depo-provera shot from [phone] with no complications. She never used HRT.    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 Her husband, [name], is a Administrator. The patient's son, Shea Stakes age 44, works at [employer] in [place]. The patient's daughters, Lilia Pro age 54, and Levonne Spiller age 78, are students. The patient's adopted son, Vonna Kotyk age 90, is also a Ship broker.     ADVANCED DIRECTIVES:    HEALTH MAINTENANCE: Social History   Tobacco Use  . Smoking status: Never Smoker  .  Smokeless tobacco: Never Used  Substance Use Topics  . Alcohol use: No  . Drug use: No     Colonoscopy: n/a  PAP: 2015  Bone density: never done   Allergies  Allergen Reactions  . Ace Inhibitors Swelling    Swelling of lips and face  . Lisinopril Swelling    Swelling of lips, face  . Amlodipine Swelling    Leg swelling  . Adhesive [Tape] Itching and Rash    Please use paper tape  . Latex Itching and Rash    Current Outpatient Medications  Medication Sig Dispense Refill  . anastrozole (ARIMIDEX) 1 MG tablet Take 1 tablet (1 mg total) by mouth daily. 90 tablet 4  . aspirin EC 81 MG tablet Take 81 mg by mouth daily.    . carvedilol (COREG) 3.125 MG tablet TAKE 1 TABLET (3.125 MG TOTAL) BY MOUTH 2 (TWO) TIMES DAILY WITH A MEAL. 180 tablet 2  . Cyanocobalamin (VITAMIN B12) 500 MCG TABS Take 500 mcg by mouth  daily.     . ferrous sulfate 325 (65 FE) MG tablet Take 1 tablet (325 mg total) by mouth daily. 30 tablet 0  . gabapentin (NEURONTIN) 300 MG capsule Take 1 capsule (300 mg total) by mouth at bedtime. 90 capsule 4  . lidocaine-prilocaine (EMLA) cream Apply 1 application topically as needed.    Marland Kitchen losartan (COZAAR) 50 MG tablet Take 1 tablet (50 mg total) by mouth daily. 30 tablet 11  . Multiple Vitamin (MULITIVITAMIN WITH MINERALS) TABS Take 1 tablet by mouth daily.      Marland Kitchen spironolactone-hydrochlorothiazide (ALDACTAZIDE) 25-25 MG tablet TAKE 1 TABLET BY MOUTH EVERY DAY 90 tablet 0  . venlafaxine XR (EFFEXOR-XR) 37.5 MG 24 hr capsule TAKE 1 CAPSULE BY MOUTH DAILY WITH BREAKFAST. 90 capsule 2   No current facility-administered medications for this visit.     OBJECTIVE; Morbidly obese African-American woman in no acute distress Vitals:   10/21/19 1450  BP: 135/64  Pulse: 70  Resp: 18  SpO2: 100%     Body mass index is 46.13 kg/m.   Wt Readings from Last 3 Encounters:  10/21/19 228 lb 6.4 oz (103.6 kg)  09/08/19 224 lb 8 oz (101.8 kg)  08/18/19 225 lb 9.6 oz (102.3 kg)  ECOG FS:1 - Symptomatic but completely ambulatory  Sclerae unicteric, EOMs intact Wearing a mask No cervical or supraclavicular adenopathy Lungs no rales or rhonchi Heart regular rate and rhythm Abd soft, nontender, positive bowel sounds MSK no focal spinal tenderness, no upper extremity lymphedema Neuro: nonfocal, well oriented, appropriate affect Breasts: The right breast is status post mastectomy.  There is hyperpigmentation.  There is no evidence of disease recurrence.  The left breast is benign.  Both axillae are benign.   LAB RESULTS:  CMP     Component Value Date/Time   NA 135 09/08/2019 0822   K 4.3 09/08/2019 0822   CL 104 09/08/2019 0822   CO2 22 09/08/2019 0822   GLUCOSE 119 (H) 09/08/2019 0822   BUN 36 (H) 09/08/2019 0822   CREATININE 1.84 (H) 09/08/2019 0822   CREATININE 1.36 (H) 05/19/2019  0832   CALCIUM 11.0 (H) 09/08/2019 0822   CALCIUM 11.1 (H) 08/11/2019 0836   PROT 7.4 09/08/2019 0822   ALBUMIN 3.6 09/08/2019 0822   AST 21 09/08/2019 0822   AST 19 05/19/2019 0832   ALT 32 09/08/2019 0822   ALT 24 05/19/2019 0832   ALKPHOS 136 (H) 09/08/2019 0822   BILITOT 0.3 09/08/2019  2863   BILITOT 0.3 05/19/2019 0832   GFRNONAA 32 (L) 09/08/2019 0822   GFRNONAA 46 (L) 05/19/2019 0832   GFRAA 37 (L) 09/08/2019 0822   GFRAA 54 (L) 05/19/2019 0832    Lab Results  Component Value Date   TOTALPROTELP 7.0 02/25/2019     Lab Results  Component Value Date   KPAFRELGTCHN 76.7 (H) 02/25/2019   LAMBDASER 27.2 (H) 02/25/2019   KAPLAMBRATIO 2.82 (H) 02/25/2019    Lab Results  Component Value Date   WBC 4.1 10/21/2019   NEUTROABS 2.1 10/21/2019   HGB 11.4 (L) 10/21/2019   HCT 35.1 (L) 10/21/2019   MCV 85.2 10/21/2019   PLT 158 10/21/2019    '@LASTCHEMISTRY'$ @  No results found for: LABCA2  No components found for: OTRRNH657  No results for input(s): INR in the last 168 hours.  No results found for: LABCA2  No results found for: CAN199  No results found for: XUX833  No results found for: XOV291  Lab Results  Component Value Date   CA2729 38.7 (H) 02/25/2019    No components found for: HGQUANT  No results found for: CEA1 / No results found for: CEA1   No results found for: AFPTUMOR  No results found for: [place]  No results found for: PSA1  Appointment on 10/21/2019  Component Date Value Ref Range Status  . WBC 10/21/2019 4.1  4.0 - 10.5 K/uL Final  . RBC 10/21/2019 4.12  3.87 - 5.11 MIL/uL Final  . Hemoglobin 10/21/2019 11.4* 12.0 - 15.0 g/dL Final  . HCT 10/21/2019 35.1* 36.0 - 46.0 % Final  . MCV 10/21/2019 85.2  80.0 - 100.0 fL Final  . MCH 10/21/2019 27.7  26.0 - 34.0 pg Final  . MCHC 10/21/2019 32.5  30.0 - 36.0 g/dL Final  . RDW 10/21/2019 15.0  11.5 - 15.5 % Final  . Platelets 10/21/2019 158  150 - 400 K/uL Final  . nRBC 10/21/2019 0.0   0.0 - 0.2 % Final  . Neutrophils Relative % 10/21/2019 51  % Final  . Neutro Abs 10/21/2019 2.1  1.7 - 7.7 K/uL Final  . Lymphocytes Relative 10/21/2019 30  % Final  . Lymphs Abs 10/21/2019 1.2  0.7 - 4.0 K/uL Final  . Monocytes Relative 10/21/2019 16  % Final  . Monocytes Absolute 10/21/2019 0.6  0.1 - 1.0 K/uL Final  . Eosinophils Relative 10/21/2019 2  % Final  . Eosinophils Absolute 10/21/2019 0.1  0.0 - 0.5 K/uL Final  . Basophils Relative 10/21/2019 1  % Final  . Basophils Absolute 10/21/2019 0.0  0.0 - 0.1 K/uL Final  . Immature Granulocytes 10/21/2019 0  % Final  . Abs Immature Granulocytes 10/21/2019 0.01  0.00 - 0.07 K/uL Final   Performed at [facility], [address] [place], [place] [address]    (this displays the last labs from the last 3 days)  Lab Results  Component Value Date   TOTALPROTELP 7.0 02/25/2019   (this displays SPEP labs)  Lab Results  Component Value Date   KPAFRELGTCHN 76.7 (H) 02/25/2019   LAMBDASER 27.2 (H) 02/25/2019   KAPLAMBRATIO 2.82 (H) 02/25/2019   (kappa/lambda light chains)  No results found for: HGBA, HGBA2QUANT, HGBFQUANT, HGBSQUAN (Hemoglobinopathy evaluation)   No results found for: LDH  No results found for: IRON, TIBC, IRONPCTSAT (Iron and TIBC)  No results found for: FERRITIN  Urinalysis    Component Value Date/Time   COLORURINE YELLOW 08/18/2019 [place] 08/18/2019  1420   LABSPEC 1.016 08/18/2019 1420   PHURINE 5.0 08/18/2019 1420   GLUCOSEU NEGATIVE 08/18/2019 1420   HGBUR NEGATIVE 08/18/2019 1420   [place] 08/18/2019 1420   [place] 08/18/2019 1420   PROTEINUR NEGATIVE 08/18/2019 1420   UROBILINOGEN 0.2 11/29/2011 1656   NITRITE NEGATIVE 08/18/2019 1420   LEUKOCYTESUR SMALL (A) 08/18/2019 1420     STUDIES: No results found.  ELIGIBLE FOR AVAILABLE RESEARCH PROTOCOL: no   ASSESSMENT: 48 y.o. [place], [place] woman  (1) status post left  lumpectomy in 2000 for a (?) T2N0 breast cancer,  (a) status post adjuvant chemotherapy  (b) status post adjuvant radiation  (c) did not receive antiestrogens  (2) genetics testing May 18, 2018 through the common Hereditary Cancers Panel + Myelodysplastic Syndrome/Leukemia Panel found no deleterious mutations in APC, ATM, AXIN2, BARD1, BLM, BMPR1A, BRCA1, BRCA2, BRIP1, CDH1, CDK4, CDKN2A (p14ARF), CDKN2A (p16INK4a), CEBPA, CHEK2, CTNNA1, DICER1, EPCAM*, GATA2, GREM1*, HRAS, KIT, MEN1, MLH1, MSH2, MSH3, MSH6, MUTYH, NBN, NF1, PALB2, PDGFRA, PMS2, POLD1, POLE, PTEN, RAD50, RAD51C, RAD51D, RUNX1, SDHB, SDHC, SDHD, SMAD4, SMARCA4, STK11, TERC, TERT, TP53, TSC1, TSC2, VHL The following genes were evaluated for sequence changes only: HOXB13*, NTHL1*, SDHA  (a)  3 variants of uncertain significance were identified in the genes BARD1 c.764A>G (p.Asn255Ser), BRIP1 c.2563C>T (p.Arg855Cys), and PALB2 c.3103A>T (p.Ile1035Phe).   (3) status post right breast biopsy 03/16/2018 for a clinical mT2-3 N2, anatomic stage III invasive ductal carcinoma, grade 2, triple positive, with an MIB-1 of 80%.  (a) bone scan and chest CT scan negative for metastases except for a T6 lytic lesion of concern  (b) thoracic spine MRI was ordered May 2019 but never performed.  (4) neoadjuvant chemotherapy consisting of carboplatin, docetaxel, trastuzumab and Pertuzumab every 3 weeks x 6 starting 04/16/2018  (a) pertuzumab held beginning with cycle 2 because of diarrhea  (5) trastuzumab to be continued Q4w indefinitely given diagnosis of stage IV disease below  (a) echocardiogram on 06/29/2018 showed an ejection fraction in the 65-70% range  (b) echocardiogram 11/19/2018 showed an ejection fraction in the 60-65% range  (c) echo 04/07/2019 shows an ejection fraction in the 55-60% range  (d) echo 07/15/2019 shows EF of 60-65%  (6) right mastectomy and sentinel lymph node sampling on 08/26/2018 found a residual 2.5cm invasive ductal  carcinoma, with 2 of 5 sentinel lymph nodes positive, ypT2, ypN1a; margins were clear  (7) adjuvant radiation 09/30/2018 - 11/16/2018 Site/dose:   The patient initially received a dose of 50.4 Gy in 28 fractions to the right chest wall and supraclavicular region. This was delivered using a 3-D conformal, 4 field technique. The patient then received a boost to the mastectomy scar. This delivered an additional 10 Gy in 5 fractions using an en face electron field. The total dose was 60.4   (8) anastrozole started 12/03/2018  (a) [place] on 10/01/2018 was 64.3, and estradiol 7.0, consistent with menopause  (b) referral to pelvic floor rehabilitation 12/03/2018  METASTATIC DISEASE: March 2020 (9) CT of neck and CT angiography of the chest 02/04/2019 finds the T6 lytic lesion noted May 2019 (see #3 above) is now sclerotic; a sclerotic T3 lesion is stable; there is a new lytic lesion in the manubrium  (a) PET scan 03/12/2019 shows manubrium, T3 and T5 metastases, no visceral disease  (b) biopsy of manubrial lesion planned but not done secondary to pandemic  (c) Bone scan 09/01/19 shows ? progression of disease in spine; MRI 09/02/19 shows early metastases in T10 (new), T3  and T5 stable; CT chest 09/01/19 shows 64m pulmonary nodules that are new, but non specific and warrant future follow up  (10) SRS to spine, palliative treatment to sternum, from 05/13/2019 through 05/26/2019:  1. Chest_sternum // 40 Gy in 10 fractions  2. Thoracic Spine (T3) // 18 Gy in 1 fraction   3. Thoracic Spine (T5) // 18 Gy in 1 fraction  (11) denosumab/Xgeva started 09/08/2019   PLAN: KBurundiis now 9 months into definitive diagnosis of metastatic breast cancer.  She has oligometastatic disease, and we have irradiated the relevant areas of the spine.  She received her first denosumab treatment last month.  It would be useful to obtain a new baseline study at this point.  A PET scan will tell uKoreaabout the lungs, where there have  been some new nodules on CT scan of questionable significance, about the spine areas, which were irradiated and may or may not be active, and also whether we have any new visceral disease elsewhere.  This is scheduled for 10/26/2019  She is taking appropriate pandemic precautions  We discussed fitness and weight control issues.  She is very motivated to improve and has got herself a treadmill.  They also have competitions in her home as to who goes up and down the stairs more during the day.  She will call to get her echo rescheduled.  We will proceed with treatment today  I wrote her a prescription for Diflucan to take care of the yeast infection.  She will tell me if it does not work for her.  She will return to see me in 3 months.  She knows to call for any other issues that may develop before then.  GVirgie Dad Magrinat, MD  10/21/19 3:00 PM Medical Oncology and Hematology [facility] [id number]. [phone]   Fax. [phone]  I, KWilburn Mylar am acting as scribe for Dr. GVirgie Dad Magrinat.  I, GLurline DelMD, have reviewed the above documentation for accuracy and completeness, and I agree with the above.

## 2019-10-21 ENCOUNTER — Inpatient Hospital Stay: Payer: BC Managed Care – PPO

## 2019-10-21 ENCOUNTER — Inpatient Hospital Stay (HOSPITAL_BASED_OUTPATIENT_CLINIC_OR_DEPARTMENT_OTHER): Payer: BC Managed Care – PPO | Admitting: Oncology

## 2019-10-21 ENCOUNTER — Inpatient Hospital Stay: Payer: BC Managed Care – PPO | Attending: Oncology

## 2019-10-21 ENCOUNTER — Other Ambulatory Visit: Payer: Self-pay

## 2019-10-21 VITALS — BP 135/64 | HR 70 | Resp 18 | Ht 59.0 in | Wt 228.4 lb

## 2019-10-21 DIAGNOSIS — C50811 Malignant neoplasm of overlapping sites of right female breast: Secondary | ICD-10-CM | POA: Diagnosis not present

## 2019-10-21 DIAGNOSIS — Z79899 Other long term (current) drug therapy: Secondary | ICD-10-CM | POA: Diagnosis not present

## 2019-10-21 DIAGNOSIS — C50411 Malignant neoplasm of upper-outer quadrant of right female breast: Secondary | ICD-10-CM | POA: Diagnosis not present

## 2019-10-21 DIAGNOSIS — Z170 Estrogen receptor positive status [ER+]: Secondary | ICD-10-CM

## 2019-10-21 DIAGNOSIS — C7951 Secondary malignant neoplasm of bone: Secondary | ICD-10-CM | POA: Diagnosis not present

## 2019-10-21 DIAGNOSIS — Z5112 Encounter for antineoplastic immunotherapy: Secondary | ICD-10-CM | POA: Diagnosis not present

## 2019-10-21 LAB — CBC WITH DIFFERENTIAL/PLATELET
Abs Immature Granulocytes: 0.01 10*3/uL (ref 0.00–0.07)
Basophils Absolute: 0 10*3/uL (ref 0.0–0.1)
Basophils Relative: 1 %
Eosinophils Absolute: 0.1 10*3/uL (ref 0.0–0.5)
Eosinophils Relative: 2 %
HCT: 35.1 % — ABNORMAL LOW (ref 36.0–46.0)
Hemoglobin: 11.4 g/dL — ABNORMAL LOW (ref 12.0–15.0)
Immature Granulocytes: 0 %
Lymphocytes Relative: 30 %
Lymphs Abs: 1.2 10*3/uL (ref 0.7–4.0)
MCH: 27.7 pg (ref 26.0–34.0)
MCHC: 32.5 g/dL (ref 30.0–36.0)
MCV: 85.2 fL (ref 80.0–100.0)
Monocytes Absolute: 0.6 10*3/uL (ref 0.1–1.0)
Monocytes Relative: 16 %
Neutro Abs: 2.1 10*3/uL (ref 1.7–7.7)
Neutrophils Relative %: 51 %
Platelets: 158 10*3/uL (ref 150–400)
RBC: 4.12 MIL/uL (ref 3.87–5.11)
RDW: 15 % (ref 11.5–15.5)
WBC: 4.1 10*3/uL (ref 4.0–10.5)
nRBC: 0 % (ref 0.0–0.2)

## 2019-10-21 LAB — COMPREHENSIVE METABOLIC PANEL
ALT: 33 U/L (ref 0–44)
AST: 21 U/L (ref 15–41)
Albumin: 3.6 g/dL (ref 3.5–5.0)
Alkaline Phosphatase: 119 U/L (ref 38–126)
Anion gap: 11 (ref 5–15)
BUN: 31 mg/dL — ABNORMAL HIGH (ref 6–20)
CO2: 24 mmol/L (ref 22–32)
Calcium: 10.4 mg/dL — ABNORMAL HIGH (ref 8.9–10.3)
Chloride: 103 mmol/L (ref 98–111)
Creatinine, Ser: 1.31 mg/dL — ABNORMAL HIGH (ref 0.44–1.00)
GFR calc Af Amer: 56 mL/min — ABNORMAL LOW (ref >60)
GFR calc non Af Amer: 48 mL/min — ABNORMAL LOW (ref >60)
Glucose, Bld: 86 mg/dL (ref 70–99)
Potassium: 4.3 mmol/L (ref 3.5–5.1)
Sodium: 138 mmol/L (ref 135–145)
Total Bilirubin: 0.2 mg/dL — ABNORMAL LOW (ref 0.3–1.2)
Total Protein: 7.3 g/dL (ref 6.5–8.1)

## 2019-10-21 MED ORDER — ACETAMINOPHEN 325 MG PO TABS
ORAL_TABLET | ORAL | Status: AC
  Filled 2019-10-21: qty 2

## 2019-10-21 MED ORDER — ACETAMINOPHEN 325 MG PO TABS
650.0000 mg | ORAL_TABLET | Freq: Once | ORAL | Status: AC
  Administered 2019-10-21: 650 mg via ORAL

## 2019-10-21 MED ORDER — SODIUM CHLORIDE 0.9 % IV SOLN
Freq: Once | INTRAVENOUS | Status: AC
  Administered 2019-10-21: 16:00:00 via INTRAVENOUS
  Filled 2019-10-21: qty 250

## 2019-10-21 MED ORDER — TRASTUZUMAB-DKST CHEMO 150 MG IV SOLR
819.0000 mg | Freq: Once | INTRAVENOUS | Status: AC
  Administered 2019-10-21: 819 mg via INTRAVENOUS
  Filled 2019-10-21: qty 39

## 2019-10-21 MED ORDER — HEPARIN SOD (PORK) LOCK FLUSH 100 UNIT/ML IV SOLN
500.0000 [IU] | Freq: Once | INTRAVENOUS | Status: AC | PRN
  Administered 2019-10-21: 500 [IU]
  Filled 2019-10-21: qty 5

## 2019-10-21 MED ORDER — DIPHENHYDRAMINE HCL 25 MG PO CAPS
ORAL_CAPSULE | ORAL | Status: AC
  Filled 2019-10-21: qty 1

## 2019-10-21 MED ORDER — FLUCONAZOLE 100 MG PO TABS: 100.0000 mg | ORAL_TABLET | Freq: Every day | ORAL | 1 refills | Status: DC

## 2019-10-21 MED ORDER — SODIUM CHLORIDE 0.9% FLUSH
10.0000 mL | INTRAVENOUS | Status: DC | PRN
  Administered 2019-10-21: 10 mL
  Filled 2019-10-21: qty 10

## 2019-10-21 MED ORDER — DIPHENHYDRAMINE HCL 25 MG PO CAPS
25.0000 mg | ORAL_CAPSULE | Freq: Once | ORAL | Status: AC
  Administered 2019-10-21: 25 mg via ORAL

## 2019-10-21 NOTE — Progress Notes (Signed)
MD increasing Herceptin dose to 8mg /kg Q 28days.  Hardie Pulley, PharmD, BCPS, BCOP

## 2019-10-21 NOTE — Patient Instructions (Signed)
[facility] Discharge Instructions for Patients Receiving Chemotherapy  Today you received the following chemotherapy agents:  trastuzamab  To help prevent nausea and vomiting after your treatment, we encourage you to take your nausea medication as prescribed.   If you develop nausea and vomiting that is not controlled by your nausea medication, call the clinic.   BELOW ARE SYMPTOMS THAT SHOULD BE REPORTED IMMEDIATELY:  *FEVER GREATER THAN 100.5 F  *CHILLS WITH OR WITHOUT FEVER  NAUSEA AND VOMITING THAT IS NOT CONTROLLED WITH YOUR NAUSEA MEDICATION  *UNUSUAL SHORTNESS OF BREATH  *UNUSUAL BRUISING OR BLEEDING  TENDERNESS IN MOUTH AND THROAT WITH OR WITHOUT PRESENCE OF ULCERS  *URINARY PROBLEMS  *BOWEL PROBLEMS  UNUSUAL RASH Items with * indicate a potential emergency and should be followed up as soon as possible.  Feel free to call the clinic should you have any questions or concerns. The clinic phone number is [phone].  Please show the [facility] at check-in to the Emergency Department and triage nurse.  Denosumab injection What is this medicine? DENOSUMAB (den oh sue mab) slows bone breakdown. Prolia is used to treat osteoporosis in women after menopause and in men, and in people who are taking corticosteroids for 6 months or more. Delton See is used to treat a high calcium level due to cancer and to prevent bone fractures and other bone problems caused by multiple myeloma or cancer bone metastases. Delton See is also used to treat giant cell tumor of the bone. This medicine may be used for other purposes; ask your health care provider or pharmacist if you have questions. COMMON BRAND NAME(S): Prolia, XGEVA What should I tell my health care provider before I take this medicine? They need to know if you have any of these conditions:  dental disease  having surgery or tooth extraction  infection  kidney disease  low levels of calcium or Vitamin D in  the blood  malnutrition  on hemodialysis  skin conditions or sensitivity  thyroid or parathyroid disease  an unusual reaction to denosumab, other medicines, foods, dyes, or preservatives  pregnant or trying to get pregnant  breast-feeding How should I use this medicine? This medicine is for injection under the skin. It is given by a health care professional in a hospital or clinic setting. A special MedGuide will be given to you before each treatment. Be sure to read this information carefully each time. For Prolia, talk to your pediatrician regarding the use of this medicine in children. Special care may be needed. For Delton See, talk to your pediatrician regarding the use of this medicine in children. While this drug may be prescribed for children as young as 13 years for selected conditions, precautions do apply. Overdosage: If you think you have taken too much of this medicine contact a poison control center or emergency room at once. NOTE: This medicine is only for you. Do not share this medicine with others. What if I miss a dose? It is important not to miss your dose. Call your doctor or health care professional if you are unable to keep an appointment. What may interact with this medicine? Do not take this medicine with any of the following medications:  other medicines containing denosumab This medicine may also interact with the following medications:  medicines that lower your chance of fighting infection  steroid medicines like prednisone or cortisone This list may not describe all possible interactions. Give your health care provider a list of all the medicines, herbs,  non-prescription drugs, or dietary supplements you use. Also tell them if you smoke, drink alcohol, or use illegal drugs. Some items may interact with your medicine. What should I watch for while using this medicine? Visit your doctor or health care professional for regular checks on your progress. Your  doctor or health care professional may order blood tests and other tests to see how you are doing. Call your doctor or health care professional for advice if you get a fever, chills or sore throat, or other symptoms of a cold or flu. Do not treat yourself. This drug may decrease your body's ability to fight infection. Try to avoid being around people who are sick. You should make sure you get enough calcium and vitamin D while you are taking this medicine, unless your doctor tells you not to. Discuss the foods you eat and the vitamins you take with your health care professional. See your dentist regularly. Brush and floss your teeth as directed. Before you have any dental work done, tell your dentist you are receiving this medicine. Do not become pregnant while taking this medicine or for 5 months after stopping it. Talk with your doctor or health care professional about your birth control options while taking this medicine. Women should inform their doctor if they wish to become pregnant or think they might be pregnant. There is a potential for serious side effects to an unborn child. Talk to your health care professional or pharmacist for more information. What side effects may I notice from receiving this medicine? Side effects that you should report to your doctor or health care professional as soon as possible:  allergic reactions like skin rash, itching or hives, swelling of the face, lips, or tongue  bone pain  breathing problems  dizziness  jaw pain, especially after dental work  redness, blistering, peeling of the skin  signs and symptoms of infection like fever or chills; cough; sore throat; pain or trouble passing urine  signs of low calcium like fast heartbeat, muscle cramps or muscle pain; pain, tingling, numbness in the hands or feet; seizures  unusual bleeding or bruising  unusually weak or tired Side effects that usually do not require medical attention (report to your  doctor or health care professional if they continue or are bothersome):  constipation  diarrhea  headache  joint pain  loss of appetite  muscle pain  runny nose  tiredness  upset stomach This list may not describe all possible side effects. Call your doctor for medical advice about side effects. You may report side effects to FDA at 1-800-FDA-1088. Where should I keep my medicine? This medicine is only given in a clinic, doctor's office, or other health care setting and will not be stored at home. NOTE: This sheet is a summary. It may not cover all possible information. If you have questions about this medicine, talk to your doctor, pharmacist, or health care provider.  2020 Elsevier/Gold Standard (2018-03-27 16:10:44)

## 2019-10-22 ENCOUNTER — Telehealth: Payer: Self-pay | Admitting: Oncology

## 2019-10-22 NOTE — Telephone Encounter (Signed)
Scheduled per los. Called and spoke with patient. Confirmed appts  

## 2019-10-26 ENCOUNTER — Other Ambulatory Visit: Payer: Self-pay | Admitting: Oncology

## 2019-10-26 ENCOUNTER — Other Ambulatory Visit: Payer: Self-pay | Admitting: *Deleted

## 2019-10-26 ENCOUNTER — Encounter (HOSPITAL_COMMUNITY)
Admission: RE | Admit: 2019-10-26 | Discharge: 2019-10-26 | Disposition: A | Discharge status: Home / Self Care | Payer: BC Managed Care – PPO | Source: Ambulatory Visit | Attending: Oncology | Admitting: Oncology

## 2019-10-26 ENCOUNTER — Other Ambulatory Visit: Payer: Self-pay

## 2019-10-26 ENCOUNTER — Ambulatory Visit (HOSPITAL_COMMUNITY)
Admission: RE | Admit: 2019-10-26 | Discharge: 2019-10-26 | Disposition: A | Discharge status: Home / Self Care | Payer: BC Managed Care – PPO | Source: Ambulatory Visit | Attending: Internal Medicine | Admitting: Internal Medicine

## 2019-10-26 DIAGNOSIS — Z01818 Encounter for other preprocedural examination: Secondary | ICD-10-CM | POA: Diagnosis not present

## 2019-10-26 DIAGNOSIS — C50411 Malignant neoplasm of upper-outer quadrant of right female breast: Secondary | ICD-10-CM | POA: Diagnosis not present

## 2019-10-26 DIAGNOSIS — Z8673 Personal history of transient ischemic attack (TIA), and cerebral infarction without residual deficits: Secondary | ICD-10-CM | POA: Diagnosis not present

## 2019-10-26 DIAGNOSIS — Z170 Estrogen receptor positive status [ER+]: Secondary | ICD-10-CM

## 2019-10-26 DIAGNOSIS — I340 Nonrheumatic mitral (valve) insufficiency: Secondary | ICD-10-CM | POA: Diagnosis not present

## 2019-10-26 DIAGNOSIS — C7951 Secondary malignant neoplasm of bone: Secondary | ICD-10-CM

## 2019-10-26 DIAGNOSIS — I10 Essential (primary) hypertension: Secondary | ICD-10-CM | POA: Diagnosis not present

## 2019-10-26 DIAGNOSIS — C50919 Malignant neoplasm of unspecified site of unspecified female breast: Secondary | ICD-10-CM | POA: Diagnosis not present

## 2019-10-26 LAB — GLUCOSE, CAPILLARY: Glucose-Capillary: 95 mg/dL (ref 70–99)

## 2019-10-26 MED ORDER — FLUDEOXYGLUCOSE F - 18 (FDG) INJECTION
11.4300 | Freq: Once | INTRAVENOUS | Status: AC | PRN
  Administered 2019-10-26: 11.43 via INTRAVENOUS

## 2019-10-26 NOTE — Progress Notes (Signed)
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  She is agreeable.  We will start on 17 December which is when her next dose would be due.

## 2019-10-26 NOTE — Progress Notes (Signed)
  Echocardiogram 2D Echocardiogram has been performed.  [name] 10/26/2019, 10:48 AM

## 2019-11-03 ENCOUNTER — Ambulatory Visit: Payer: BC Managed Care – PPO | Admitting: Adult Health

## 2019-11-03 ENCOUNTER — Other Ambulatory Visit: Payer: BC Managed Care – PPO

## 2019-11-03 ENCOUNTER — Ambulatory Visit: Payer: BC Managed Care – PPO

## 2019-11-08 ENCOUNTER — Other Ambulatory Visit: Payer: Self-pay | Admitting: *Deleted

## 2019-11-08 DIAGNOSIS — C7951 Secondary malignant neoplasm of bone: Secondary | ICD-10-CM

## 2019-11-17 ENCOUNTER — Other Ambulatory Visit: Payer: Self-pay | Admitting: Radiation Therapy

## 2019-11-18 ENCOUNTER — Inpatient Hospital Stay: Payer: BC Managed Care – PPO

## 2019-11-18 ENCOUNTER — Inpatient Hospital Stay: Payer: BC Managed Care – PPO | Admitting: Oncology

## 2019-11-18 ENCOUNTER — Inpatient Hospital Stay: Payer: BC Managed Care – PPO | Attending: Oncology

## 2019-11-18 ENCOUNTER — Telehealth: Payer: Self-pay

## 2019-11-18 NOTE — Telephone Encounter (Signed)
LVM for pt to return call regarding her missed appt's today.

## 2019-11-24 ENCOUNTER — Telehealth: Payer: Self-pay | Admitting: Oncology

## 2019-11-24 NOTE — Telephone Encounter (Signed)
Returned patient's phone call regarding rescheduling an appointment, left a voicemail. 

## 2019-11-25 IMAGING — PT NUCLEAR MEDICINE PET IMAGE INITIAL (PI) SKULL BASE TO THIGH
1 of 5 series · 7 of 40 positions shown, 9 images · non-contrast
Comparison: Multiple exams, including CT examinations of 02/04/2019

CLINICAL DATA: Subsequent treatment strategy for right breast
cancer. Bone lesions of uncertain etiology.

EXAM:
NUCLEAR MEDICINE PET SKULL BASE TO THIGH
TECHNIQUE: 10.9 mCi F-18 FDG was injected intravenously. Full-ring PET imaging
was performed from the skull base to thigh after the radiotracer. CT
data was obtained and used for attenuation correction and anatomic
localization.
Fasting blood glucose: 42 mg/dl

[Series 4: ct sk_thigh 5.0 b31f · axial · 0.98mm/px · z∈[-708,-48]mm · 7 of 221 slices shown, 9 images]
[im 28/221  brain]
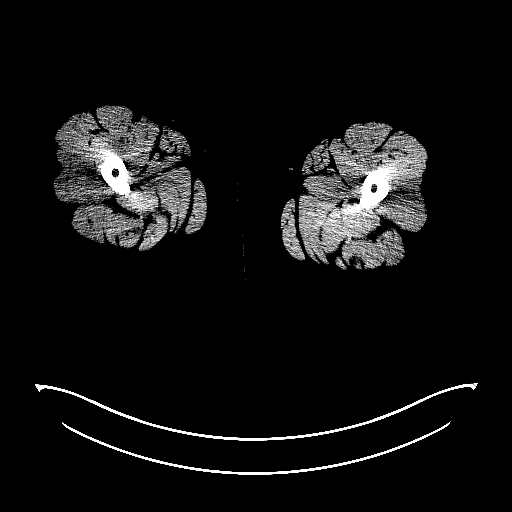
[im 28/221  bone]
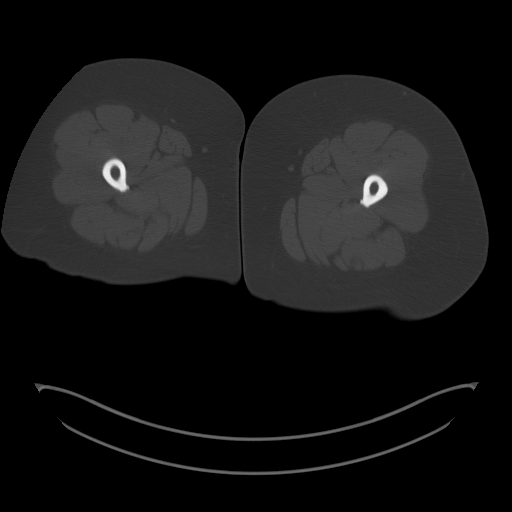
[im 56/221  brain]
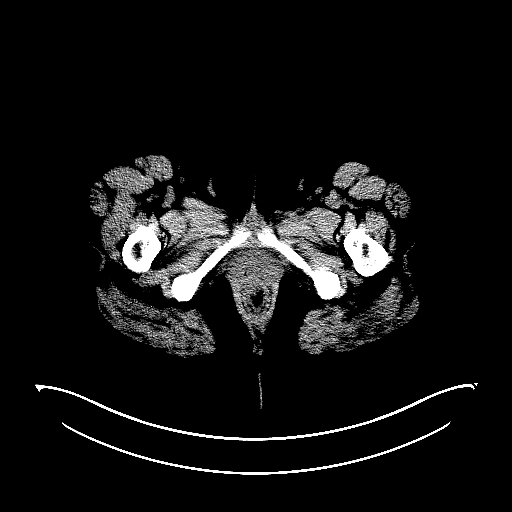
[im 83/221  brain]
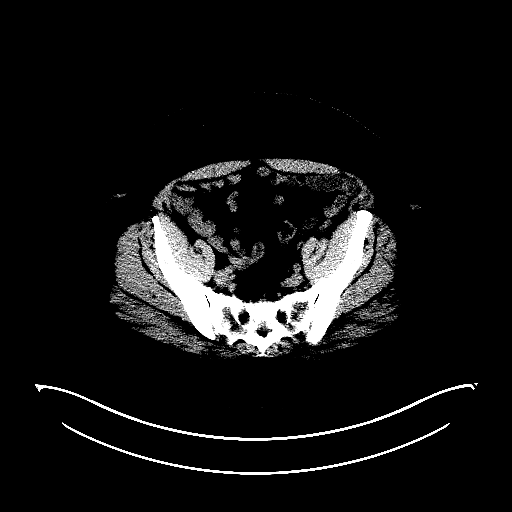
[im 111/221  brain]
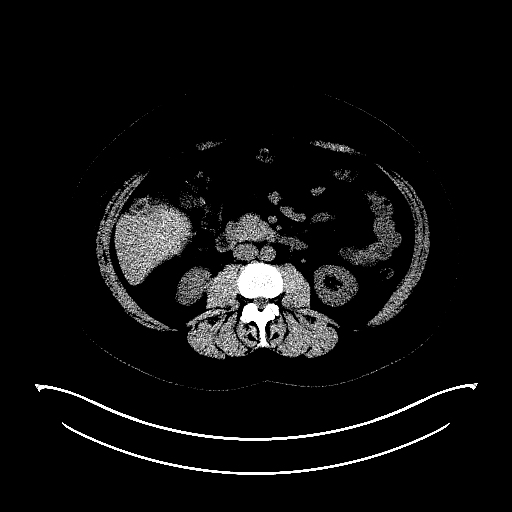
[im 138/221  brain]
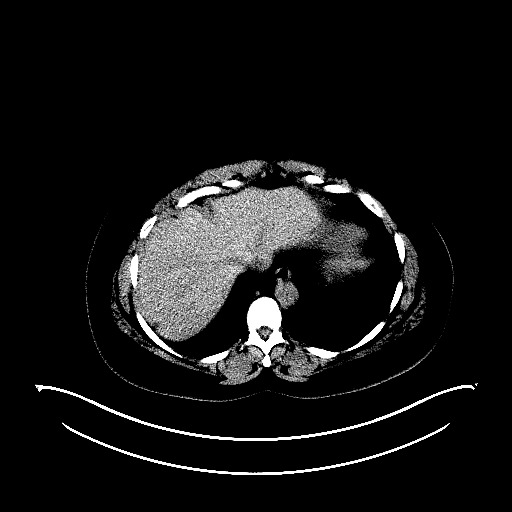
[im 138/221  bone]
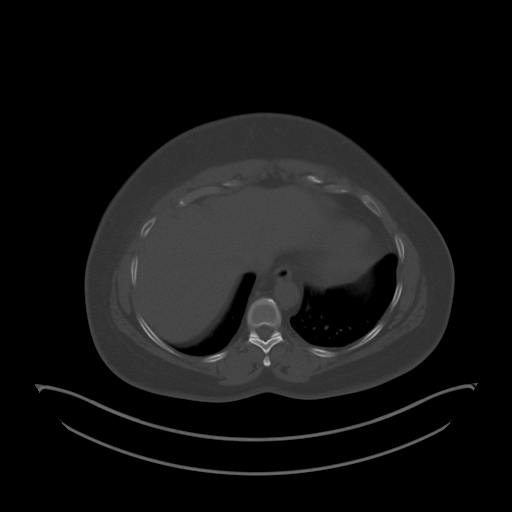
[im 166/221  brain]
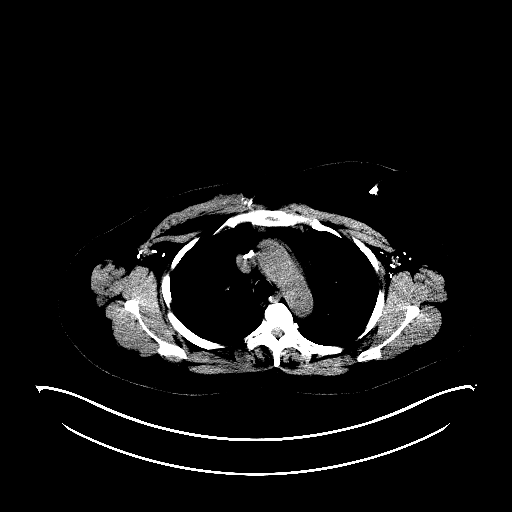
[im 193/221  brain]
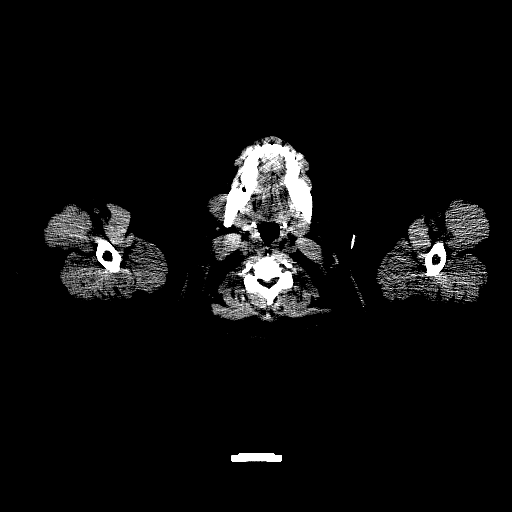

[7 of 40 positions shown; findings below may reference images not displayed]

FINDINGS: Mediastinal blood pool activity: SUV max

NECK: No significant abnormal hypermetabolic activity in this
region.

Incidental CT findings: none

CHEST: Right mastectomy with a lateral 3.9 by 2.2 cm irregularly
marginated soft tissue density with some internal hypodensity, with
a maximum SUV of 3.6, probably from local scarring.

Incidental CT findings: Left Port-A-Cath tip: SVC. Mild
cardiomegaly. Radiation therapy related findings anteriorly in the
right lung. A right upper paratracheal node measures 0.8 cm in short
axis with maximum SUV of 2.2, below blood pool levels.

ABDOMEN/PELVIS: No significant abnormal hypermetabolic activity in
this region.

Incidental CT findings: Small metal density in the right mesentery
on image 132/4, probably a migrated tubal ligation clip.

SKELETON: Pathologic fracture along the right upper sternal
manubrium with underlying lytic lesion, maximum SUV 11.9.

Hypermetabolic lytic lesion of the right T3 lamina, spinous process,
and transverse process with maximum SUV 10.7.

Expansile lesion of the T5 spinous process, maximum SUV 5.5.

Hypermetabolic focus in the right mandible with some adjacent
sclerosis but also a periapical lucency in cavity of the right
middle lower (tooth # 31) immediately in this vicinity, maximum SUV
7.6.

Incidental CT findings: none
IMPRESSION: 1. Hypermetabolic bony lytic lesions of the right upper sternal
manubrium; the right T3 posterior elements; and of the T5 spinous
process, compatible with bony malignancy.
2. A hypermetabolic focus in the right mandible appears to most
closely correspond with a periapical lucency and cavity of tooth #
31, and accordingly is most likely due to dental disease rather than
malignancy.
3. Postoperative and post radiation therapy findings in the right
chest wall and right anterior lung.
4. Mild cardiomegaly.

## 2019-11-29 ENCOUNTER — Other Ambulatory Visit (HOSPITAL_COMMUNITY): Payer: Self-pay

## 2019-11-29 MED ORDER — LOSARTAN POTASSIUM 50 MG PO TABS: 50.0000 mg | ORAL_TABLET | Freq: Every day | ORAL | 11 refills | Status: DC

## 2019-12-06 ENCOUNTER — Telehealth: Payer: Self-pay | Admitting: Oncology

## 2019-12-06 NOTE — Telephone Encounter (Signed)
Rescheduled per 12/31 sch msg. Called and left msg.

## 2019-12-08 ENCOUNTER — Ambulatory Visit: Payer: BC Managed Care – PPO

## 2019-12-08 ENCOUNTER — Other Ambulatory Visit: Payer: BC Managed Care – PPO

## 2019-12-08 ENCOUNTER — Ambulatory Visit: Payer: BC Managed Care – PPO | Admitting: Oncology

## 2019-12-08 ENCOUNTER — Ambulatory Visit (HOSPITAL_COMMUNITY): Admission: RE | Admit: 2019-12-08 | Payer: Self-pay | Source: Ambulatory Visit

## 2019-12-12 ENCOUNTER — Other Ambulatory Visit: Payer: Self-pay | Admitting: Oncology

## 2019-12-13 ENCOUNTER — Ambulatory Visit: Payer: Self-pay | Admitting: Urology

## 2019-12-15 ENCOUNTER — Ambulatory Visit: Payer: Self-pay | Admitting: Urology

## 2019-12-16 ENCOUNTER — Other Ambulatory Visit: Payer: BC Managed Care – PPO

## 2019-12-16 ENCOUNTER — Ambulatory Visit: Payer: BC Managed Care – PPO

## 2019-12-17 ENCOUNTER — Inpatient Hospital Stay: Payer: BC Managed Care – PPO

## 2019-12-17 ENCOUNTER — Inpatient Hospital Stay: Payer: BC Managed Care – PPO | Attending: Oncology

## 2019-12-17 ENCOUNTER — Other Ambulatory Visit: Payer: Self-pay

## 2019-12-17 VITALS — BP 124/70 | HR 73 | Temp 98.6°F | Resp 18 | Ht 59.0 in | Wt 230.4 lb

## 2019-12-17 DIAGNOSIS — Z5112 Encounter for antineoplastic immunotherapy: Secondary | ICD-10-CM | POA: Insufficient documentation

## 2019-12-17 DIAGNOSIS — C50411 Malignant neoplasm of upper-outer quadrant of right female breast: Secondary | ICD-10-CM

## 2019-12-17 DIAGNOSIS — Z95828 Presence of other vascular implants and grafts: Secondary | ICD-10-CM

## 2019-12-17 DIAGNOSIS — Z170 Estrogen receptor positive status [ER+]: Secondary | ICD-10-CM | POA: Diagnosis not present

## 2019-12-17 DIAGNOSIS — Z79899 Other long term (current) drug therapy: Secondary | ICD-10-CM | POA: Diagnosis not present

## 2019-12-17 DIAGNOSIS — C50811 Malignant neoplasm of overlapping sites of right female breast: Secondary | ICD-10-CM | POA: Insufficient documentation

## 2019-12-17 DIAGNOSIS — C7951 Secondary malignant neoplasm of bone: Secondary | ICD-10-CM | POA: Insufficient documentation

## 2019-12-17 LAB — CBC WITH DIFFERENTIAL/PLATELET
Abs Immature Granulocytes: 0.02 10*3/uL (ref 0.00–0.07)
Basophils Absolute: 0 10*3/uL (ref 0.0–0.1)
Basophils Relative: 1 %
Eosinophils Absolute: 0.1 10*3/uL (ref 0.0–0.5)
Eosinophils Relative: 2 %
HCT: 37.6 % (ref 36.0–46.0)
Hemoglobin: 12.4 g/dL (ref 12.0–15.0)
Immature Granulocytes: 0 %
Lymphocytes Relative: 23 %
Lymphs Abs: 1.1 10*3/uL (ref 0.7–4.0)
MCH: 27.9 pg (ref 26.0–34.0)
MCHC: 33 g/dL (ref 30.0–36.0)
MCV: 84.7 fL (ref 80.0–100.0)
Monocytes Absolute: 0.5 10*3/uL (ref 0.1–1.0)
Monocytes Relative: 12 %
Neutro Abs: 2.9 10*3/uL (ref 1.7–7.7)
Neutrophils Relative %: 62 %
Platelets: 163 10*3/uL (ref 150–400)
RBC: 4.44 MIL/uL (ref 3.87–5.11)
RDW: 14.6 % (ref 11.5–15.5)
WBC: 4.7 10*3/uL (ref 4.0–10.5)
nRBC: 0 % (ref 0.0–0.2)

## 2019-12-17 LAB — COMPREHENSIVE METABOLIC PANEL
ALT: 30 U/L (ref 0–44)
AST: 20 U/L (ref 15–41)
Albumin: 3.6 g/dL (ref 3.5–5.0)
Alkaline Phosphatase: 89 U/L (ref 38–126)
Anion gap: 10 (ref 5–15)
BUN: 30 mg/dL — ABNORMAL HIGH (ref 6–20)
CO2: 25 mmol/L (ref 22–32)
Calcium: 10.7 mg/dL — ABNORMAL HIGH (ref 8.9–10.3)
Chloride: 105 mmol/L (ref 98–111)
Creatinine, Ser: 1.32 mg/dL — ABNORMAL HIGH (ref 0.44–1.00)
GFR calc Af Amer: 55 mL/min — ABNORMAL LOW (ref >60)
GFR calc non Af Amer: 48 mL/min — ABNORMAL LOW (ref >60)
Glucose, Bld: 118 mg/dL — ABNORMAL HIGH (ref 70–99)
Potassium: 4.3 mmol/L (ref 3.5–5.1)
Sodium: 140 mmol/L (ref 135–145)
Total Bilirubin: 0.3 mg/dL (ref 0.3–1.2)
Total Protein: 7.5 g/dL (ref 6.5–8.1)

## 2019-12-17 MED ORDER — ACETAMINOPHEN 325 MG PO TABS
650.0000 mg | ORAL_TABLET | Freq: Once | ORAL | Status: AC
  Administered 2019-12-17: 650 mg via ORAL

## 2019-12-17 MED ORDER — DENOSUMAB 120 MG/1.7ML ~~LOC~~ SOLN
120.0000 mg | Freq: Once | SUBCUTANEOUS | Status: AC
  Administered 2019-12-17: 120 mg via SUBCUTANEOUS

## 2019-12-17 MED ORDER — SODIUM CHLORIDE 0.9% FLUSH
10.0000 mL | INTRAVENOUS | Status: DC | PRN
  Administered 2019-12-17: 10 mL via INTRAVENOUS
  Filled 2019-12-17: qty 10

## 2019-12-17 MED ORDER — HEPARIN SOD (PORK) LOCK FLUSH 100 UNIT/ML IV SOLN
500.0000 [IU] | Freq: Once | INTRAVENOUS | Status: AC | PRN
  Administered 2019-12-17: 500 [IU]
  Filled 2019-12-17: qty 5

## 2019-12-17 MED ORDER — SODIUM CHLORIDE 0.9% FLUSH
10.0000 mL | INTRAVENOUS | Status: DC | PRN
  Administered 2019-12-17: 10 mL
  Filled 2019-12-17: qty 10

## 2019-12-17 MED ORDER — SODIUM CHLORIDE 0.9 % IV SOLN
Freq: Once | INTRAVENOUS | Status: AC
  Filled 2019-12-17: qty 250

## 2019-12-17 MED ORDER — DIPHENHYDRAMINE HCL 25 MG PO CAPS
ORAL_CAPSULE | ORAL | Status: AC
  Filled 2019-12-17: qty 1

## 2019-12-17 MED ORDER — DENOSUMAB 120 MG/1.7ML ~~LOC~~ SOLN
SUBCUTANEOUS | Status: AC
  Filled 2019-12-17: qty 1.7

## 2019-12-17 MED ORDER — DIPHENHYDRAMINE HCL 25 MG PO CAPS
25.0000 mg | ORAL_CAPSULE | Freq: Once | ORAL | Status: AC
  Administered 2019-12-17: 25 mg via ORAL

## 2019-12-17 MED ORDER — HEPARIN SOD (PORK) LOCK FLUSH 100 UNIT/ML IV SOLN
500.0000 [IU] | Freq: Once | INTRAVENOUS | Status: DC
  Filled 2019-12-17: qty 5

## 2019-12-17 MED ORDER — SODIUM CHLORIDE 0.9 % IV SOLN
400.0000 mg | Freq: Once | INTRAVENOUS | Status: AC
  Administered 2019-12-17: 400 mg via INTRAVENOUS
  Filled 2019-12-17: qty 20

## 2019-12-17 MED ORDER — ACETAMINOPHEN 325 MG PO TABS
ORAL_TABLET | ORAL | Status: AC
  Filled 2019-12-17: qty 2

## 2019-12-17 NOTE — Patient Instructions (Addendum)
[facility] °Discharge Instructions for Patients Receiving Chemotherapy ° °Today you received the following chemotherapy agents Kadcyla ° °To help prevent nausea and vomiting after your treatment, we encourage you to take your nausea medication as directed ° °  °If you develop nausea and vomiting that is not controlled by your nausea medication, call the clinic.  ° °BELOW ARE SYMPTOMS THAT SHOULD BE REPORTED IMMEDIATELY: °· *FEVER GREATER THAN 100.5 F °· *CHILLS WITH OR WITHOUT FEVER °· NAUSEA AND VOMITING THAT IS NOT CONTROLLED WITH YOUR NAUSEA MEDICATION °· *UNUSUAL SHORTNESS OF BREATH °· *UNUSUAL BRUISING OR BLEEDING °· TENDERNESS IN MOUTH AND THROAT WITH OR WITHOUT PRESENCE OF ULCERS °· *URINARY PROBLEMS °· *BOWEL PROBLEMS °· UNUSUAL RASH °Items with * indicate a potential emergency and should be followed up as soon as possible. ° °Feel free to call the clinic should you have any questions or concerns. The clinic phone number is [phone]. ° °Please show the CHEMO ALERT CARD at check-in to the Emergency Department and triage nurse. ° ° °Ado-Trastuzumab Emtansine for injection °What is this medicine? °ADO-TRASTUZUMAB EMTANSINE (ADD oh traz TOO zuh mab em TAN zine) is a monoclonal antibody combined with chemotherapy. It is used to treat breast cancer. °This medicine may be used for other purposes; ask your health care provider or pharmacist if you have questions. °COMMON BRAND NAME(S): Kadcyla °What should I tell my health care provider before I take this medicine? °They need to know if you have any of these conditions: °· heart disease °· heart failure °· infection (especially a virus infection such as chickenpox, cold sores, or herpes) °· liver disease °· lung or breathing disease, like asthma °· tingling of the fingers or toes, or other nerve disorder °· an unusual or allergic reaction to ado-trastuzumab emtansine, other medications, foods, dyes, or preservatives °· pregnant or trying to get  pregnant °· breast-feeding °How should I use this medicine? °This medicine is for infusion into a vein. It is given by a health care professional in a hospital or clinic setting. °Talk to your pediatrician regarding the use of this medicine in children. Special care may be needed. °Overdosage: If you think you have taken too much of this medicine contact a poison control center or emergency room at once. °NOTE: This medicine is only for you. Do not share this medicine with others. °What if I miss a dose? °It is important not to miss your dose. Call your doctor or health care professional if you are unable to keep an appointment. °What may interact with this medicine? °This medicine may also interact with the following medications: °· atazanavir °· boceprevir °· clarithromycin °· delavirdine °· indinavir °· dalfopristin; quinupristin °· isoniazid, INH °· itraconazole °· ketoconazole °· nefazodone °· nelfinavir °· ritonavir °· telaprevir °· telithromycin °· tipranavir °· voriconazole °This list may not describe all possible interactions. Give your health care provider a list of all the medicines, herbs, non-prescription drugs, or dietary supplements you use. Also tell them if you smoke, drink alcohol, or use illegal drugs. Some items may interact with your medicine. °What should I watch for while using this medicine? °Visit your doctor for checks on your progress. This drug may make you feel generally unwell. This is not uncommon, as chemotherapy can affect healthy cells as well as cancer cells. Report any side effects. Continue your course of treatment even though you feel ill unless your doctor tells you to stop. °You may need blood work done while you are taking   this medicine. °Call your doctor or health care professional for advice if you get a fever, chills or sore throat, or other symptoms of a cold or flu. Do not treat yourself. This drug decreases your body's ability to fight infections. Try to avoid being  around people who are sick. °Be careful brushing and flossing your teeth or using a toothpick because you may get an infection or bleed more easily. If you have any dental work done, tell your dentist you are receiving this medicine. °Avoid taking products that contain aspirin, acetaminophen, ibuprofen, naproxen, or ketoprofen unless instructed by your doctor. These medicines may hide a fever. °Do not become pregnant while taking this medicine or for 7 months after stopping it, men with female partners should use contraception during treatment and for 4 months after the last dose. Women should inform their doctor if they wish to become pregnant or think they might be pregnant. There is a potential for serious side effects to an unborn child. Do not breast-feed an infant while taking this medicine or for 7 months after the last dose. °Men who have a partner who is pregnant or who is capable of becoming pregnant should use a condom during sexual activity while taking this medicine and for 4 months after stopping it. Men should inform their doctors if they wish to father a child. This medicine may lower sperm counts. Talk to your health care professional or pharmacist for more information. °What side effects may I notice from receiving this medicine? °Side effects that you should report to your doctor or health care professional as soon as possible: °· allergic reactions like skin rash, itching or hives, swelling of the face, lips, or tongue °· breathing problems °· chest pain or palpitations °· fever or chills, sore throat °· general ill feeling or flu-like symptoms °· light-colored stools °· nausea, vomiting °· pain, tingling, numbness in the hands or feet °· signs and symptoms of bleeding such as bloody or black, tarry stools; red or dark-brown urine; spitting up blood or brown material that looks like coffee grounds; red spots on the skin; unusual bruising or bleeding from the eye, gums, or nose °· swelling of the  legs or ankles °· yellowing of the eyes or skin °Side effects that usually do not require medical attention (report to your doctor or health care professional if they continue or are bothersome): °· changes in taste °· constipation °· dizziness °· headache °· joint pain °· muscle pain °· trouble sleeping °· unusually weak or tired °This list may not describe all possible side effects. Call your doctor for medical advice about side effects. You may report side effects to FDA at 1-800-FDA-1088. °Where should I keep my medicine? °This drug is given in a hospital or clinic and will not be stored at home. °NOTE: This sheet is a summary. It may not cover all possible information. If you have questions about this medicine, talk to your doctor, pharmacist, or health care provider. °© 2020 Elsevier/Gold Standard (2018-04-17 10:03:15) ° °

## 2019-12-18 LAB — FOLLICLE STIMULATING HORMONE: FSH: 70.4 m[IU]/mL

## 2019-12-20 ENCOUNTER — Telehealth: Payer: Self-pay

## 2019-12-20 ENCOUNTER — Telehealth: Payer: Self-pay | Admitting: *Deleted

## 2019-12-20 NOTE — Telephone Encounter (Signed)
Called pt & left message to return call to let us know how she did with her treatment last week.

## 2019-12-20 NOTE — Telephone Encounter (Signed)
-----   Message from Rennis Harding, RN sent at 12/17/2019  2:02 PM EST ----- Regarding: Dr. Jana Hakim 1st time Kadcyla First time Kadcyla, patient tolerated well.

## 2019-12-20 NOTE — Telephone Encounter (Signed)
Chemo f/u call - obtained vm.  Pt not identified on vm - no msg left.

## 2019-12-24 ENCOUNTER — Telehealth: Payer: Self-pay | Admitting: Radiation Therapy

## 2019-12-24 NOTE — Telephone Encounter (Signed)
Left a voicemail requesting a call back. [name] [name] had an MRI of her thoracic spine scheduled earlier this month that was not completed. I would like to get that scan and her follow-up with Shona Simpson PA-C, rescheduled.   Mont Dutton R.T.(R)(T) Radiation Special Procedures Navigator

## 2019-12-27 ENCOUNTER — Telehealth: Payer: Self-pay

## 2019-12-27 MED ORDER — MECLIZINE HCL 25 MG PO TABS: 25.0000 mg | ORAL_TABLET | Freq: Three times a day (TID) | ORAL | 0 refills | Status: DC | PRN

## 2019-12-27 NOTE — Telephone Encounter (Signed)
Pt called to report extreme dizziness that started this AM.  Pt reports falling X 1 with no injury.  "I ended up just sitting on the floor but lost balance."    Pt reports episode of vertigo in the past.  "I feel like this is like the vertigo I had one time before."    Pt reports headache over weekend, that has eased off.  Denies any blurred vision, fever, or weakness.  Pt reports that she is well hydrated.    RN reviewed with MD.  MD recommendations to continue with hydration, Meclizine 25mg  TID as needed for dizziness, and referral to [facility].    Pt voiced understanding.  Would like to try prescription first to see effectiveness prior to referral.  RN educated on safety precautions with ambulation with dizziness.  Pt voiced understanding and appreciation.

## 2019-12-28 LAB — ESTRADIOL, ULTRA SENS: Estradiol, Sensitive: <2.5 pg/mL

## 2020-01-03 ENCOUNTER — Telehealth: Payer: Self-pay | Admitting: Radiation Therapy

## 2020-01-03 NOTE — Telephone Encounter (Signed)
Left another message requesting a call back to get the thoracic MRI  And follow-up with Bryson Ha rescheduled.   Mont Dutton R.T.(R)(T) Radiation Special Procedures Navigator

## 2020-01-12 NOTE — Progress Notes (Signed)
[place]  Telephone:([phone] Fax:([phone]) [phone]    ID: [name] [name] DOB: [birth year]  MR#: [id number]  IHW#:[id number]  Patient Care Team: Marda Stalker, PA-C as PCP - General (Family Medicine) Jerline Pain, MD as PCP - Cardiology (Cardiology) Lindamarie Maclachlan, Virgie Dad, MD as Consulting Physician (Oncology) Loreta Ave, MD as Referring Physician (Internal Medicine) Donato Heinz, MD as Consulting Physician (Nephrology) Kyung Rudd, MD as Consulting Physician (Radiation Oncology) Jovita Kussmaul, MD as Consulting Physician (General Surgery) Larey Dresser, MD as Consulting Physician (Cardiology) Mauro Kaufmann, RN as Oncology Nurse Navigator Rockwell Germany, RN as Oncology Nurse Navigator OTHER MD:   CHIEF COMPLAINT: 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  She continues on anastrozole.  Her hot flashes have significantly improved.  She does not have a bone density screening on file but she is receiving denosumab/Xgeva, which will more than compensate for any problems with anastrozole.  She tolerates this with no complications.  Since her last visit, she underwent repeat echocardiogram on 10/26/2019 showing an ejection fraction of 55-60%.  She underwent restaging PET scan on 10/26/2019, which revealed evidence of mild progression of metastatic breast cancer-- new hypermetabolic mediastinal lymph node inferior to the carina along the right bronchus intermedius, two new small hypermetabolic skeletal lesions at T10 and in right acetabulum. Previously treated manubrial lesion and T3 vertebral body lesion showed interval decrease in metabolic activity.  In light of this, she was switched from trastuzumab to T-DM1 on 11/18/2019.She is scheduled for her 2d dose today   REVIEW OF  SYSTEMS: [name] feels terrific.  They did smoke a [place] for Christmas and they all got sick but aside from that she has had no headaches visual changes cough phlegm production pleurisy shortness of breath or any other evidence suggestive of disease recurrence.  A detailed review of systems was benign.   HISTORY OF CURRENT ILLNESS: From the original intake note:  [name] Gaal palpated a lump on 01/15/2018. She felt soreness and shooting pain under her arm and in the right breast. She underwent bilateral diagnostic mammography with tomography and right breast ultrasonography at [facility] on 03/12/2018 showing: breast density category C. The is architectural distortion at the 11 o'clock position. There is also an oval mass in the right breast anterior depth. Examination of the right axilla showed enlarged lymph nodes. Ultrasonography showed a 4.6 cm mass in the right breast upper outer quadrant posterior depth. An additional 1.2 cm oval mass in the right breast 12 o'clock middle depth. The lymph nodes in the right axillary are highly suggestive of malignancy.   Accordingly on 03/16/2018 she proceeded to biopsy of the right breast area in question. The pathology from this procedure showed ([id number]): At both the 11 and 12 o'clock positions: Invasive ductal carcinoma grade II. Ductal carcinoma in situ, high grade, with necrosis and calcifications. Prognostic indicators significant for: estrogen receptor, 90% positive and progesterone receptor, 40% positive, both with strong staining intensity. Proliferation marker Ki67 at 80%. HER2 amplified with ratios HER2/CEP17 SIGNALS 6.90 and average HER2 copies per cell 14.50  The patient's subsequent history is as detailed below.   PAST MEDICAL HISTORY: Past Medical History:  Diagnosis Date  . Anxiety   . Breast cancer, left breast ([place]) 2000   Underwent lumpectomy, chemotherapy and radiation  . Facial paralysis/Bells palsy    left side  .  Family history of  lung cancer   . Family history of lymphoma   . Hypertension   . Neuropathy    IN FINGERS AND TOES   RECENT INFUSION OF CHEMO  . Personal history of breast cancer 05/06/2018  . Renal disorder    just after TIA acute kidney injury  . TIA (transient ischemic attack) 03/16/2017   [facility]   The patient has a previous history of left breast cancer diagnosed in 2000. She was seen by [facility] in [place], [place] under Dr. Loreta Ave. According to the patient, her left breast cancer was stage II with no lymph node involvement (likely T2N0). She had chemotherapy every 3 weeks for about 6 months. She did not takes any "red drugs." She also did not take anti-estrogens (likely ER/PR negative).   TIA in 2018. She saw a neurologist as a precaution. She denies any clotting issues.    PAST SURGICAL HISTORY: Past Surgical History:  Procedure Laterality Date  . BREAST LUMPECTOMY WITH AXILLARY LYMPH NODE BIOPSY Left 2000   biopsy  . BREAST SURGERY     partial mastectomy with lymph node removal  . MASTECTOMY MODIFIED RADICAL Right 08/26/2018   Procedure: MASTECTOMY MODIFIED RADICAL;  Surgeon: Jovita Kussmaul, MD;  Location: [facility];  Service: General;  Laterality: Right;  . MODIFIED MASTECTOMY Right 08/26/2018  . PORTACATH PLACEMENT Left 04/08/2018   Procedure: INSERTION PORT-A-CATH;  Surgeon: Jovita Kussmaul, MD;  Location: [facility];  Service: General;  Laterality: Left;  . TUBAL LIGATION Bilateral 2004  . WISDOM TOOTH EXTRACTION      FAMILY HISTORY Family History  Problem Relation Age of Onset  . Hypertension Mother   . Hypertension Father   . Diverticulosis Father   . Diabetes Father   . Lung cancer Father 45       hx smoking  . Hypertension Maternal Grandmother   . CAD Maternal Grandmother   . Alzheimer's disease Maternal Grandmother        died at 73  . Hypertension Paternal Grandmother   . CAD Paternal Grandmother   . Stroke Paternal Grandfather   . Sickle cell trait  Paternal Grandfather   . Pneumonia Maternal Aunt        died at a young adult  . Lymphoma Paternal Aunt 49   The patient's father is alive at age 13 and had a history of lung cancer. The patient's mother is alive at age 52. The patient has 1 brother and no sisters. She denies a family history of breast or ovarian cancer.     GYNECOLOGIC HISTORY:  No LMP recorded. (Menstrual status: Chemotherapy). Menarche: 49 years old Age at first live birth: 49 years old She is GXP3. She is no longer having periods, which were irregular and light. Her LMP was  January 2019. She is not having hot flashes. The patient used oral contraceptive from [phone] and the Depo-provera shot from [id number] with no complications. She never used HRT.    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 Her husband, [name], is a Administrator. The patient's son, Shea Stakes age 13, works at [employer] in [place]. The patient's daughters, Lilia Pro age 60, and Levonne Spiller age 31, are students. The patient's adopted son, Vonna Kotyk age 43, is also a Ship broker.     ADVANCED DIRECTIVES:    HEALTH MAINTENANCE: Social History   Tobacco Use  . Smoking status: Never Smoker  . Smokeless tobacco: Never Used  Substance Use Topics  .  Alcohol use: No  . Drug use: No     Colonoscopy: n/a  PAP: 2015  Bone density: never done   Allergies  Allergen Reactions  . Ace Inhibitors Swelling    Swelling of lips and face  . Lisinopril Swelling    Swelling of lips, face  . Amlodipine Swelling    Leg swelling  . Adhesive [Tape] Itching and Rash    Please use paper tape  . Latex Itching and Rash    Current Outpatient Medications  Medication Sig Dispense Refill  . anastrozole (ARIMIDEX) 1 MG tablet Take 1 tablet (1 mg total) by mouth daily. 90 tablet 4  . aspirin EC 81 MG tablet Take 81 mg by mouth daily.    . carvedilol (COREG) 3.125 MG tablet TAKE 1 TABLET (3.125 MG TOTAL) BY MOUTH 2 (TWO) TIMES DAILY WITH A MEAL.  180 tablet 2  . Cyanocobalamin (VITAMIN B12) 500 MCG TABS Take 500 mcg by mouth daily.     . ferrous sulfate 325 (65 FE) MG tablet Take 1 tablet (325 mg total) by mouth daily. 30 tablet 0  . gabapentin (NEURONTIN) 300 MG capsule Take 1 capsule (300 mg total) by mouth at bedtime. 90 capsule 4  . lidocaine-prilocaine (EMLA) cream Apply 1 application topically as needed.    Marland Kitchen losartan (COZAAR) 50 MG tablet Take 1 tablet (50 mg total) by mouth daily. 30 tablet 11  . meclizine (ANTIVERT) 25 MG tablet Take 1 tablet (25 mg total) by mouth 3 (three) times daily as needed for dizziness. 30 tablet 0  . Multiple Vitamin (MULITIVITAMIN WITH MINERALS) TABS Take 1 tablet by mouth daily.      Marland Kitchen spironolactone-hydrochlorothiazide (ALDACTAZIDE) 25-25 MG tablet TAKE 1 TABLET BY MOUTH EVERY DAY 90 tablet 0  . venlafaxine XR (EFFEXOR-XR) 37.5 MG 24 hr capsule TAKE 1 CAPSULE BY MOUTH DAILY WITH BREAKFAST. 90 capsule 2   No current facility-administered medications for this visit.    OBJECTIVE; Morbidly obese African-American woman who appears well Vitals:   01/13/20 0929  BP: 119/82  Pulse: 72  Resp: 18  Temp: 98 F (36.7 C)  SpO2: 100%     Body mass index is 46.37 kg/m.   Wt Readings from Last 3 Encounters:  01/13/20 229 lb 9.6 oz (104.1 kg)  12/17/19 230 lb 6.4 oz (104.5 kg)  10/21/19 228 lb 6.4 oz (103.6 kg)  ECOG FS:1 - Symptomatic but completely ambulatory  Sclerae unicteric, EOMs intact Wearing a mask No cervical or supraclavicular adenopathy Lungs no rales or rhonchi Heart regular rate and rhythm Abd soft, nontender, positive bowel sounds MSK no focal spinal tenderness, no upper extremity lymphedema, excellent range of motion right upper extremity neuro: nonfocal, well oriented, appropriate affect Breasts: The right breast is status post mastectomy.  There is no evidence of local recurrence.  The left breast is benign.  Both axillae are benign.   LAB RESULTS:  CMP     Component Value  Date/Time   NA 139 01/13/2020 0854   K 4.4 01/13/2020 0854   CL 105 01/13/2020 0854   CO2 23 01/13/2020 0854   GLUCOSE 125 (H) 01/13/2020 0854   BUN 32 (H) 01/13/2020 0854   CREATININE 1.40 (H) 01/13/2020 0854   CREATININE 1.36 (H) 05/19/2019 0832   CALCIUM 10.2 01/13/2020 0854   CALCIUM 11.1 (H) 08/11/2019 0836   PROT 7.6 01/13/2020 0854   ALBUMIN 3.5 01/13/2020 0854   AST 36 01/13/2020 0854   AST 19 05/19/2019 4656  ALT 59 (H) 01/13/2020 0854   ALT 24 05/19/2019 0832   ALKPHOS 88 01/13/2020 0854   BILITOT 0.2 (L) 01/13/2020 0854   BILITOT 0.3 05/19/2019 0832   GFRNONAA 44 (L) 01/13/2020 0854   GFRNONAA 46 (L) 05/19/2019 0832   GFRAA 51 (L) 01/13/2020 0854   GFRAA 54 (L) 05/19/2019 0832    Lab Results  Component Value Date   TOTALPROTELP 7.0 02/25/2019     Lab Results  Component Value Date   KPAFRELGTCHN 76.7 (H) 02/25/2019   LAMBDASER 27.2 (H) 02/25/2019   KAPLAMBRATIO 2.82 (H) 02/25/2019    Lab Results  Component Value Date   WBC 5.4 01/13/2020   NEUTROABS 3.7 01/13/2020   HGB 12.2 01/13/2020   HCT 37.6 01/13/2020   MCV 84.3 01/13/2020   PLT 189 01/13/2020   No results found for: LABCA2  No components found for: [id number]  No results for input(s): INR in the last 168 hours.  No results found for: LABCA2  No results found for: NWG956  No results found for: OZH086  No results found for: VHQ469  Lab Results  Component Value Date   CA2729 38.7 (H) 02/25/2019    No components found for: HGQUANT  No results found for: CEA1 / No results found for: CEA1   No results found for: AFPTUMOR  No results found for: CHROMOGRNA  No results found for: HGBA, HGBA2QUANT, HGBFQUANT, HGBSQUAN (Hemoglobinopathy evaluation)   No results found for: LDH  No results found for: IRON, TIBC, IRONPCTSAT (Iron and TIBC)  No results found for: FERRITIN  Urinalysis    Component Value Date/Time   COLORURINE YELLOW 08/18/2019 [place]  08/18/2019 1420   LABSPEC 1.016 08/18/2019 1420   PHURINE 5.0 08/18/2019 [place] 08/18/2019 [place] 08/18/2019 [place] 08/18/2019 1420   KETONESUR NEGATIVE 08/18/2019 1420   PROTEINUR NEGATIVE 08/18/2019 1420   UROBILINOGEN 0.2 11/29/2011 1656   NITRITE NEGATIVE 08/18/2019 1420   LEUKOCYTESUR SMALL (A) 08/18/2019 1420     STUDIES: No results found.  ELIGIBLE FOR AVAILABLE RESEARCH PROTOCOL: no   ASSESSMENT: 49 y.o. [place], [place] woman  (1) status post left lumpectomy in 2000 for a (?) T2N0 breast cancer,  (a) status post adjuvant chemotherapy  (b) status post adjuvant radiation  (c) did not receive antiestrogens  (2) genetics testing May 18, 2018 through the common Hereditary Cancers Panel + Myelodysplastic Syndrome/Leukemia Panel found no deleterious mutations in APC, ATM, AXIN2, BARD1, BLM, BMPR1A, BRCA1, BRCA2, BRIP1, CDH1, CDK4, CDKN2A (p14ARF), CDKN2A (p16INK4a), CEBPA, CHEK2, CTNNA1, DICER1, EPCAM*, GATA2, GREM1*, HRAS, KIT, MEN1, MLH1, MSH2, MSH3, MSH6, MUTYH, NBN, NF1, PALB2, PDGFRA, PMS2, POLD1, POLE, PTEN, RAD50, RAD51C, RAD51D, RUNX1, SDHB, SDHC, SDHD, SMAD4, SMARCA4, STK11, TERC, TERT, TP53, TSC1, TSC2, VHL The following genes were evaluated for sequence changes only: HOXB13*, NTHL1*, SDHA  (a)  3 variants of uncertain significance were identified in the genes BARD1 c.764A>G (p.Asn255Ser), BRIP1 c.2563C>T (p.Arg855Cys), and PALB2 c.3103A>T (p.Ile1035Phe).   (3) status post right breast biopsy 03/16/2018 for a clinical mT2-3 N2, anatomic stage III invasive ductal carcinoma, grade 2, triple positive, with an MIB-1 of 80%.  (a) bone scan and chest CT scan negative for metastases except for a T6 lytic lesion of concern  (b) thoracic spine MRI was ordered May 2019 but never performed.  (4) neoadjuvant chemotherapy consisting of carboplatin, docetaxel, trastuzumab and Pertuzumab every 3 weeks x 6 starting 04/16/2018  (a)  pertuzumab held beginning  with cycle 2 because of diarrhea  (5) trastuzumab to be continued Q4w indefinitely, discontinued after 10/21/2019 dose with progression  (a) echocardiogram on 06/29/2018 showed an ejection fraction in the 65-70% range  (b) echocardiogram 11/19/2018 showed an ejection fraction in the 60-65% range  (c) echo 04/07/2019 shows an ejection fraction in the 55-60% range  (d) echo 07/15/2019 shows EF of 60-65%  (6) right mastectomy and sentinel lymph node sampling on 08/26/2018 found a residual 2.5cm invasive ductal carcinoma, with 2 of 5 sentinel lymph nodes positive, ypT2, ypN1a; margins were clear  (7) adjuvant radiation 09/30/2018 - 11/16/2018 Site/dose:   The patient initially received a dose of 50.4 Gy in 28 fractions to the right chest wall and supraclavicular region. This was delivered using a 3-D conformal, 4 field technique. The patient then received a boost to the mastectomy scar. This delivered an additional 10 Gy in 5 fractions using an en face electron field. The total dose was 60.4   (8) anastrozole started 12/03/2018  (a) [place] on 10/01/2018 was 64.3, and estradiol 7.0, consistent with menopause  (b) referral to pelvic floor rehabilitation 12/03/2018  METASTATIC DISEASE: March 2020 (9) CT of neck and CT angiography of the chest 02/04/2019 finds the T6 lytic lesion noted May 2019 (see #3 above) is now sclerotic; a sclerotic T3 lesion is stable; there is a new lytic lesion in the manubrium  (a) PET scan 03/12/2019 shows manubrium, T3 and T5 metastases, no visceral disease  (b) biopsy of manubrial lesion planned but not done secondary to pandemic  (c) Bone scan 09/01/19 shows ? progression of disease in spine; MRI 09/02/19 shows early metastases in T10 (new), T3 and T5 stable; CT chest 09/01/19 shows 80m pulmonary nodules that are new, but non specific and warrant future follow up  (d) PET 10/26/2019 shows no lung or liver lesions; new mediastinal adenopathy, bone  lesions  (10) SRS to spine, palliative treatment to sternum, from 05/13/2019 through 05/26/2019:  1. Chest_sternum // 40 Gy in 10 fractions  2. Thoracic Spine (T3) // 18 Gy in 1 fraction   3. Thoracic Spine (T5) // 18 Gy in 1 fraction  (11) denosumab/Xgeva started 09/08/2019  (12) T-DM1 started 01/15/20201  (a) echo 10/26/2019 shows stable EF at 55-60%   PLAN: KBurundiis now just about a year out from definitive diagnosis of metastatic breast cancer.  She is essentially asymptomatic from this and she is tolerating her treatment without any side effects that she is aware of.  Her echocardiogram is very favorable.  We are repeating that every 3 months.  She will also have a PET scan at the end of April and I will see her with the May dose of T-DM1.  I am delighted at how well she is doing.  She knows to call for any other issue that may develop before the next visit.  Total encounter time 25 minutes.*Sarajane JewsC. Kalub Morillo, MD  01/13/20 9:50 AM Medical Oncology and Hematology [facility] [place] [address]Tel. [phone]   Fax. [phone]  I, KWilburn Mylar am acting as scribe for Dr. GVirgie Dad Evrett Hakim.  I, GLurline DelMD, have reviewed the above documentation for accuracy and completeness, and I agree with the above.   *Total Encounter Time as defined by the Centers for Medicare and Medicaid Services includes, in addition to the face-to-face time of a patient visit (documented in the note above) non-face-to-face time: obtaining and reviewing outside history,  ordering and reviewing medications, tests or procedures, care coordination (communications with other health care professionals or caregivers) and documentation in the medical record.

## 2020-01-13 ENCOUNTER — Inpatient Hospital Stay: Payer: BC Managed Care – PPO

## 2020-01-13 ENCOUNTER — Other Ambulatory Visit: Payer: Self-pay

## 2020-01-13 ENCOUNTER — Inpatient Hospital Stay: Payer: BC Managed Care – PPO | Attending: Oncology | Admitting: Oncology

## 2020-01-13 VITALS — BP 135/74 | HR 63 | Temp 98.2°F | Resp 18

## 2020-01-13 VITALS — BP 119/82 | HR 72 | Temp 98.0°F | Resp 18 | Ht 59.0 in | Wt 229.6 lb

## 2020-01-13 DIAGNOSIS — C7951 Secondary malignant neoplasm of bone: Secondary | ICD-10-CM | POA: Insufficient documentation

## 2020-01-13 DIAGNOSIS — Z5112 Encounter for antineoplastic immunotherapy: Secondary | ICD-10-CM | POA: Insufficient documentation

## 2020-01-13 DIAGNOSIS — Z170 Estrogen receptor positive status [ER+]: Secondary | ICD-10-CM

## 2020-01-13 DIAGNOSIS — Z79899 Other long term (current) drug therapy: Secondary | ICD-10-CM | POA: Insufficient documentation

## 2020-01-13 DIAGNOSIS — Z6841 Body Mass Index (BMI) 40.0 and over, adult: Secondary | ICD-10-CM

## 2020-01-13 DIAGNOSIS — C50411 Malignant neoplasm of upper-outer quadrant of right female breast: Secondary | ICD-10-CM

## 2020-01-13 DIAGNOSIS — Z95828 Presence of other vascular implants and grafts: Secondary | ICD-10-CM

## 2020-01-13 DIAGNOSIS — Z7189 Other specified counseling: Secondary | ICD-10-CM

## 2020-01-13 DIAGNOSIS — I10 Essential (primary) hypertension: Secondary | ICD-10-CM

## 2020-01-13 DIAGNOSIS — I890 Lymphedema, not elsewhere classified: Secondary | ICD-10-CM

## 2020-01-13 LAB — COMPREHENSIVE METABOLIC PANEL
ALT: 59 U/L — ABNORMAL HIGH (ref 0–44)
AST: 36 U/L (ref 15–41)
Albumin: 3.5 g/dL (ref 3.5–5.0)
Alkaline Phosphatase: 88 U/L (ref 38–126)
Anion gap: 11 (ref 5–15)
BUN: 32 mg/dL — ABNORMAL HIGH (ref 6–20)
CO2: 23 mmol/L (ref 22–32)
Calcium: 10.2 mg/dL (ref 8.9–10.3)
Chloride: 105 mmol/L (ref 98–111)
Creatinine, Ser: 1.4 mg/dL — ABNORMAL HIGH (ref 0.44–1.00)
GFR calc Af Amer: 51 mL/min — ABNORMAL LOW (ref >60)
GFR calc non Af Amer: 44 mL/min — ABNORMAL LOW (ref >60)
Glucose, Bld: 125 mg/dL — ABNORMAL HIGH (ref 70–99)
Potassium: 4.4 mmol/L (ref 3.5–5.1)
Sodium: 139 mmol/L (ref 135–145)
Total Bilirubin: 0.2 mg/dL — ABNORMAL LOW (ref 0.3–1.2)
Total Protein: 7.6 g/dL (ref 6.5–8.1)

## 2020-01-13 LAB — CBC WITH DIFFERENTIAL/PLATELET
Abs Immature Granulocytes: 0.02 10*3/uL (ref 0.00–0.07)
Basophils Absolute: 0 10*3/uL (ref 0.0–0.1)
Basophils Relative: 1 %
Eosinophils Absolute: 0.1 10*3/uL (ref 0.0–0.5)
Eosinophils Relative: 2 %
HCT: 37.6 % (ref 36.0–46.0)
Hemoglobin: 12.2 g/dL (ref 12.0–15.0)
Immature Granulocytes: 0 %
Lymphocytes Relative: 17 %
Lymphs Abs: 0.9 10*3/uL (ref 0.7–4.0)
MCH: 27.4 pg (ref 26.0–34.0)
MCHC: 32.4 g/dL (ref 30.0–36.0)
MCV: 84.3 fL (ref 80.0–100.0)
Monocytes Absolute: 0.7 10*3/uL (ref 0.1–1.0)
Monocytes Relative: 12 %
Neutro Abs: 3.7 10*3/uL (ref 1.7–7.7)
Neutrophils Relative %: 68 %
Platelets: 189 10*3/uL (ref 150–400)
RBC: 4.46 MIL/uL (ref 3.87–5.11)
RDW: 15 % (ref 11.5–15.5)
WBC: 5.4 10*3/uL (ref 4.0–10.5)
nRBC: 0 % (ref 0.0–0.2)

## 2020-01-13 MED ORDER — HEPARIN SOD (PORK) LOCK FLUSH 100 UNIT/ML IV SOLN
500.0000 [IU] | Freq: Once | INTRAVENOUS | Status: DC
  Filled 2020-01-13: qty 5

## 2020-01-13 MED ORDER — SODIUM CHLORIDE 0.9% FLUSH
10.0000 mL | INTRAVENOUS | Status: DC | PRN
  Administered 2020-01-13: 10 mL via INTRAVENOUS
  Filled 2020-01-13: qty 10

## 2020-01-13 MED ORDER — SODIUM CHLORIDE 0.9 % IV SOLN
400.0000 mg | Freq: Once | INTRAVENOUS | Status: AC
  Administered 2020-01-13: 400 mg via INTRAVENOUS
  Filled 2020-01-13: qty 20

## 2020-01-13 MED ORDER — ACETAMINOPHEN 325 MG PO TABS
ORAL_TABLET | ORAL | Status: AC
  Filled 2020-01-13: qty 2

## 2020-01-13 MED ORDER — HEPARIN SOD (PORK) LOCK FLUSH 100 UNIT/ML IV SOLN
500.0000 [IU] | Freq: Once | INTRAVENOUS | Status: AC | PRN
  Administered 2020-01-13: 500 [IU]
  Filled 2020-01-13: qty 5

## 2020-01-13 MED ORDER — ACETAMINOPHEN 325 MG PO TABS
650.0000 mg | ORAL_TABLET | Freq: Once | ORAL | Status: AC
  Administered 2020-01-13: 650 mg via ORAL

## 2020-01-13 MED ORDER — SODIUM CHLORIDE 0.9 % IV SOLN
Freq: Once | INTRAVENOUS | Status: AC
  Filled 2020-01-13: qty 250

## 2020-01-13 MED ORDER — DIPHENHYDRAMINE HCL 25 MG PO CAPS
25.0000 mg | ORAL_CAPSULE | Freq: Once | ORAL | Status: AC
  Administered 2020-01-13: 25 mg via ORAL

## 2020-01-13 MED ORDER — SODIUM CHLORIDE 0.9% FLUSH
10.0000 mL | INTRAVENOUS | Status: DC | PRN
  Administered 2020-01-13: 10 mL
  Filled 2020-01-13: qty 10

## 2020-01-13 MED ORDER — DIPHENHYDRAMINE HCL 25 MG PO CAPS
ORAL_CAPSULE | ORAL | Status: AC
  Filled 2020-01-13: qty 1

## 2020-01-13 NOTE — Addendum Note (Signed)
Addended by: Chauncey Cruel on: 01/13/2020 10:24 AM   Modules accepted: Orders

## 2020-01-13 NOTE — Addendum Note (Signed)
Addended by: Chauncey Cruel on: 01/13/2020 10:07 AM   Modules accepted: Orders

## 2020-01-14 ENCOUNTER — Telehealth: Payer: Self-pay | Admitting: Oncology

## 2020-01-14 NOTE — Telephone Encounter (Signed)
I could not reach patient regarding schedule mailbox was full 

## 2020-01-23 ENCOUNTER — Other Ambulatory Visit: Payer: Self-pay | Admitting: Oncology

## 2020-01-28 ENCOUNTER — Telehealth: Payer: Self-pay | Admitting: *Deleted

## 2020-01-28 NOTE — Telephone Encounter (Signed)
This RN received faxed communication from [facility] that the pt called at 759 am today with recurring nosebleeds.  This RN attempted to call pt to follow up per above and obtained VM.  Message left to return call if needed.

## 2020-02-03 ENCOUNTER — Inpatient Hospital Stay: Payer: BC Managed Care – PPO | Attending: Oncology

## 2020-02-03 ENCOUNTER — Telehealth: Payer: Self-pay | Admitting: *Deleted

## 2020-02-03 ENCOUNTER — Other Ambulatory Visit: Payer: Self-pay

## 2020-02-03 ENCOUNTER — Inpatient Hospital Stay: Payer: BC Managed Care – PPO

## 2020-02-03 ENCOUNTER — Other Ambulatory Visit: Payer: Self-pay | Admitting: *Deleted

## 2020-02-03 VITALS — BP 155/90 | HR 61 | Temp 99.4°F | Resp 16 | Wt 223.0 lb

## 2020-02-03 DIAGNOSIS — Z95828 Presence of other vascular implants and grafts: Secondary | ICD-10-CM

## 2020-02-03 DIAGNOSIS — Z170 Estrogen receptor positive status [ER+]: Secondary | ICD-10-CM

## 2020-02-03 DIAGNOSIS — Z79899 Other long term (current) drug therapy: Secondary | ICD-10-CM | POA: Insufficient documentation

## 2020-02-03 DIAGNOSIS — C50411 Malignant neoplasm of upper-outer quadrant of right female breast: Secondary | ICD-10-CM

## 2020-02-03 DIAGNOSIS — C7951 Secondary malignant neoplasm of bone: Secondary | ICD-10-CM | POA: Diagnosis not present

## 2020-02-03 DIAGNOSIS — Z5112 Encounter for antineoplastic immunotherapy: Secondary | ICD-10-CM | POA: Insufficient documentation

## 2020-02-03 DIAGNOSIS — C50811 Malignant neoplasm of overlapping sites of right female breast: Secondary | ICD-10-CM | POA: Insufficient documentation

## 2020-02-03 LAB — CBC WITH DIFFERENTIAL/PLATELET
Abs Immature Granulocytes: 0.01 10*3/uL (ref 0.00–0.07)
Basophils Absolute: 0 10*3/uL (ref 0.0–0.1)
Basophils Relative: 1 %
Eosinophils Absolute: 0.1 10*3/uL (ref 0.0–0.5)
Eosinophils Relative: 2 %
HCT: 37.4 % (ref 36.0–46.0)
Hemoglobin: 12 g/dL (ref 12.0–15.0)
Immature Granulocytes: 0 %
Lymphocytes Relative: 29 %
Lymphs Abs: 1.4 10*3/uL (ref 0.7–4.0)
MCH: 27 pg (ref 26.0–34.0)
MCHC: 32.1 g/dL (ref 30.0–36.0)
MCV: 84.2 fL (ref 80.0–100.0)
Monocytes Absolute: 0.7 10*3/uL (ref 0.1–1.0)
Monocytes Relative: 15 %
Neutro Abs: 2.6 10*3/uL (ref 1.7–7.7)
Neutrophils Relative %: 53 %
Platelets: 190 10*3/uL (ref 150–400)
RBC: 4.44 MIL/uL (ref 3.87–5.11)
RDW: 15.3 % (ref 11.5–15.5)
WBC: 4.9 10*3/uL (ref 4.0–10.5)
nRBC: 0 % (ref 0.0–0.2)

## 2020-02-03 LAB — COMPREHENSIVE METABOLIC PANEL
ALT: 82 U/L — ABNORMAL HIGH (ref 0–44)
AST: 60 U/L — ABNORMAL HIGH (ref 15–41)
Albumin: 3.5 g/dL (ref 3.5–5.0)
Alkaline Phosphatase: 84 U/L (ref 38–126)
Anion gap: 8 (ref 5–15)
BUN: 32 mg/dL — ABNORMAL HIGH (ref 6–20)
CO2: 26 mmol/L (ref 22–32)
Calcium: 11.2 mg/dL — ABNORMAL HIGH (ref 8.9–10.3)
Chloride: 104 mmol/L (ref 98–111)
Creatinine, Ser: 1.38 mg/dL — ABNORMAL HIGH (ref 0.44–1.00)
GFR calc Af Amer: 52 mL/min — ABNORMAL LOW (ref >60)
GFR calc non Af Amer: 45 mL/min — ABNORMAL LOW (ref >60)
Glucose, Bld: 89 mg/dL (ref 70–99)
Potassium: 4 mmol/L (ref 3.5–5.1)
Sodium: 138 mmol/L (ref 135–145)
Total Bilirubin: 0.3 mg/dL (ref 0.3–1.2)
Total Protein: 7.6 g/dL (ref 6.5–8.1)

## 2020-02-03 MED ORDER — DIPHENHYDRAMINE HCL 25 MG PO CAPS
25.0000 mg | ORAL_CAPSULE | Freq: Once | ORAL | Status: AC
  Administered 2020-02-03: 25 mg via ORAL

## 2020-02-03 MED ORDER — SODIUM CHLORIDE 0.9% FLUSH
10.0000 mL | INTRAVENOUS | Status: DC | PRN
  Administered 2020-02-03: 10 mL
  Filled 2020-02-03: qty 10

## 2020-02-03 MED ORDER — DENOSUMAB 120 MG/1.7ML ~~LOC~~ SOLN
120.0000 mg | Freq: Once | SUBCUTANEOUS | Status: AC
  Administered 2020-02-03: 120 mg via SUBCUTANEOUS

## 2020-02-03 MED ORDER — ACETAMINOPHEN 325 MG PO TABS
650.0000 mg | ORAL_TABLET | Freq: Once | ORAL | Status: AC
  Administered 2020-02-03: 650 mg via ORAL

## 2020-02-03 MED ORDER — SODIUM CHLORIDE 0.9 % IV SOLN
Freq: Once | INTRAVENOUS | Status: AC
  Filled 2020-02-03: qty 250

## 2020-02-03 MED ORDER — HEPARIN SOD (PORK) LOCK FLUSH 100 UNIT/ML IV SOLN
500.0000 [IU] | Freq: Once | INTRAVENOUS | Status: DC
  Filled 2020-02-03: qty 5

## 2020-02-03 MED ORDER — SODIUM CHLORIDE 0.9 % IV SOLN
400.0000 mg | Freq: Once | INTRAVENOUS | Status: AC
  Administered 2020-02-03: 400 mg via INTRAVENOUS
  Filled 2020-02-03: qty 20

## 2020-02-03 MED ORDER — SODIUM CHLORIDE 0.9% FLUSH
10.0000 mL | INTRAVENOUS | Status: DC | PRN
  Administered 2020-02-03: 10 mL via INTRAVENOUS
  Filled 2020-02-03: qty 10

## 2020-02-03 MED ORDER — DENOSUMAB 120 MG/1.7ML ~~LOC~~ SOLN
SUBCUTANEOUS | Status: AC
  Filled 2020-02-03: qty 1.7

## 2020-02-03 MED ORDER — DIPHENHYDRAMINE HCL 25 MG PO CAPS
ORAL_CAPSULE | ORAL | Status: AC
  Filled 2020-02-03: qty 1

## 2020-02-03 MED ORDER — ACETAMINOPHEN 325 MG PO TABS
ORAL_TABLET | ORAL | Status: AC
  Filled 2020-02-03: qty 2

## 2020-02-03 MED ORDER — HEPARIN SOD (PORK) LOCK FLUSH 100 UNIT/ML IV SOLN
500.0000 [IU] | Freq: Once | INTRAVENOUS | Status: AC | PRN
  Administered 2020-02-03: 500 [IU]
  Filled 2020-02-03: qty 5

## 2020-02-03 NOTE — Telephone Encounter (Signed)
Ok to treat today - will follow up on scheduling echo

## 2020-02-03 NOTE — Patient Instructions (Signed)
[facility] °Discharge Instructions for Patients Receiving Chemotherapy ° °Today you received the following chemotherapy agents Kadcyla ° °To help prevent nausea and vomiting after your treatment, we encourage you to take your nausea medication as directed ° °  °If you develop nausea and vomiting that is not controlled by your nausea medication, call the clinic.  ° °BELOW ARE SYMPTOMS THAT SHOULD BE REPORTED IMMEDIATELY: °· *FEVER GREATER THAN 100.5 F °· *CHILLS WITH OR WITHOUT FEVER °· NAUSEA AND VOMITING THAT IS NOT CONTROLLED WITH YOUR NAUSEA MEDICATION °· *UNUSUAL SHORTNESS OF BREATH °· *UNUSUAL BRUISING OR BLEEDING °· TENDERNESS IN MOUTH AND THROAT WITH OR WITHOUT PRESENCE OF ULCERS °· *URINARY PROBLEMS °· *BOWEL PROBLEMS °· UNUSUAL RASH °Items with * indicate a potential emergency and should be followed up as soon as possible. ° °Feel free to call the clinic should you have any questions or concerns. The clinic phone number is [phone]. ° °Please show the CHEMO ALERT CARD at check-in to the Emergency Department and triage nurse. ° ° °Ado-Trastuzumab Emtansine for injection °What is this medicine? °ADO-TRASTUZUMAB EMTANSINE (ADD oh traz TOO zuh mab em TAN zine) is a monoclonal antibody combined with chemotherapy. It is used to treat breast cancer. °This medicine may be used for other purposes; ask your health care provider or pharmacist if you have questions. °COMMON BRAND NAME(S): Kadcyla °What should I tell my health care provider before I take this medicine? °They need to know if you have any of these conditions: °· heart disease °· heart failure °· infection (especially a virus infection such as chickenpox, cold sores, or herpes) °· liver disease °· lung or breathing disease, like asthma °· tingling of the fingers or toes, or other nerve disorder °· an unusual or allergic reaction to ado-trastuzumab emtansine, other medications, foods, dyes, or preservatives °· pregnant or trying to get  pregnant °· breast-feeding °How should I use this medicine? °This medicine is for infusion into a vein. It is given by a health care professional in a hospital or clinic setting. °Talk to your pediatrician regarding the use of this medicine in children. Special care may be needed. °Overdosage: If you think you have taken too much of this medicine contact a poison control center or emergency room at once. °NOTE: This medicine is only for you. Do not share this medicine with others. °What if I miss a dose? °It is important not to miss your dose. Call your doctor or health care professional if you are unable to keep an appointment. °What may interact with this medicine? °This medicine may also interact with the following medications: °· atazanavir °· boceprevir °· clarithromycin °· delavirdine °· indinavir °· dalfopristin; quinupristin °· isoniazid, INH °· itraconazole °· ketoconazole °· nefazodone °· nelfinavir °· ritonavir °· telaprevir °· telithromycin °· tipranavir °· voriconazole °This list may not describe all possible interactions. Give your health care provider a list of all the medicines, herbs, non-prescription drugs, or dietary supplements you use. Also tell them if you smoke, drink alcohol, or use illegal drugs. Some items may interact with your medicine. °What should I watch for while using this medicine? °Visit your doctor for checks on your progress. This drug may make you feel generally unwell. This is not uncommon, as chemotherapy can affect healthy cells as well as cancer cells. Report any side effects. Continue your course of treatment even though you feel ill unless your doctor tells you to stop. °You may need blood work done while you are taking   this medicine. °Call your doctor or health care professional for advice if you get a fever, chills or sore throat, or other symptoms of a cold or flu. Do not treat yourself. This drug decreases your body's ability to fight infections. Try to avoid being  around people who are sick. °Be careful brushing and flossing your teeth or using a toothpick because you may get an infection or bleed more easily. If you have any dental work done, tell your dentist you are receiving this medicine. °Avoid taking products that contain aspirin, acetaminophen, ibuprofen, naproxen, or ketoprofen unless instructed by your doctor. These medicines may hide a fever. °Do not become pregnant while taking this medicine or for 7 months after stopping it, men with female partners should use contraception during treatment and for 4 months after the last dose. Women should inform their doctor if they wish to become pregnant or think they might be pregnant. There is a potential for serious side effects to an unborn child. Do not breast-feed an infant while taking this medicine or for 7 months after the last dose. °Men who have a partner who is pregnant or who is capable of becoming pregnant should use a condom during sexual activity while taking this medicine and for 4 months after stopping it. Men should inform their doctors if they wish to father a child. This medicine may lower sperm counts. Talk to your health care professional or pharmacist for more information. °What side effects may I notice from receiving this medicine? °Side effects that you should report to your doctor or health care professional as soon as possible: °· allergic reactions like skin rash, itching or hives, swelling of the face, lips, or tongue °· breathing problems °· chest pain or palpitations °· fever or chills, sore throat °· general ill feeling or flu-like symptoms °· light-colored stools °· nausea, vomiting °· pain, tingling, numbness in the hands or feet °· signs and symptoms of bleeding such as bloody or black, tarry stools; red or dark-brown urine; spitting up blood or brown material that looks like coffee grounds; red spots on the skin; unusual bruising or bleeding from the eye, gums, or nose °· swelling of the  legs or ankles °· yellowing of the eyes or skin °Side effects that usually do not require medical attention (report to your doctor or health care professional if they continue or are bothersome): °· changes in taste °· constipation °· dizziness °· headache °· joint pain °· muscle pain °· trouble sleeping °· unusually weak or tired °This list may not describe all possible side effects. Call your doctor for medical advice about side effects. You may report side effects to FDA at 1-800-FDA-1088. °Where should I keep my medicine? °This drug is given in a hospital or clinic and will not be stored at home. °NOTE: This sheet is a summary. It may not cover all possible information. If you have questions about this medicine, talk to your doctor, pharmacist, or health care provider. °© 2020 Elsevier/Gold Standard (2018-04-17 10:03:15) ° °

## 2020-02-03 NOTE — Telephone Encounter (Signed)
Proceed with treatment despite noted elevation in AST per MD.

## 2020-02-04 ENCOUNTER — Other Ambulatory Visit: Payer: Self-pay | Admitting: Oncology

## 2020-02-04 DIAGNOSIS — C7951 Secondary malignant neoplasm of bone: Secondary | ICD-10-CM

## 2020-02-08 ENCOUNTER — Encounter: Payer: Self-pay | Admitting: Oncology

## 2020-02-08 NOTE — Progress Notes (Signed)
[place] [employer]) to add Kadcyla to patient's portal for copay assistance.  Patient was successfully enrolled. Information will be given to [facility] for billing/copay submissions.  Enrolled patient online for copay assistance with [employer]. Patient successfully enrolled. Information to submit claims will be given to [facility] for billing/copay submissions.  Both programs only pay copay after insurance pays their portion.  Called patient to notify of both approvals. She may receive a copy of the approval letters as well for her records only.   She has my card for any additional financial questions or concerns.

## 2020-02-09 ENCOUNTER — Other Ambulatory Visit: Payer: Self-pay

## 2020-02-09 ENCOUNTER — Ambulatory Visit
Admission: RE | Admit: 2020-02-09 | Discharge: 2020-02-09 | Disposition: A | Discharge status: Home / Self Care | Payer: BC Managed Care – PPO | Source: Ambulatory Visit | Attending: Oncology | Admitting: Oncology

## 2020-02-09 DIAGNOSIS — C7951 Secondary malignant neoplasm of bone: Secondary | ICD-10-CM

## 2020-02-09 DIAGNOSIS — Z6841 Body Mass Index (BMI) 40.0 and over, adult: Secondary | ICD-10-CM

## 2020-02-09 DIAGNOSIS — Z170 Estrogen receptor positive status [ER+]: Secondary | ICD-10-CM

## 2020-02-09 DIAGNOSIS — I890 Lymphedema, not elsewhere classified: Secondary | ICD-10-CM

## 2020-02-09 DIAGNOSIS — Z1231 Encounter for screening mammogram for malignant neoplasm of breast: Secondary | ICD-10-CM | POA: Diagnosis not present

## 2020-02-09 DIAGNOSIS — Z7189 Other specified counseling: Secondary | ICD-10-CM

## 2020-02-09 DIAGNOSIS — C50411 Malignant neoplasm of upper-outer quadrant of right female breast: Secondary | ICD-10-CM

## 2020-02-09 DIAGNOSIS — I10 Essential (primary) hypertension: Secondary | ICD-10-CM

## 2020-02-17 ENCOUNTER — Telehealth: Payer: Self-pay

## 2020-02-17 NOTE — Telephone Encounter (Signed)
RN returned call, voicemail left for return call.  

## 2020-02-18 ENCOUNTER — Other Ambulatory Visit: Payer: Self-pay | Admitting: Oncology

## 2020-02-18 NOTE — Progress Notes (Signed)
ECHO due.   Sent MD inbasket msg. iVent created.  Kennith Center, Pharm.D., CPP 02/18/2020@4 :27 PM

## 2020-02-18 NOTE — Progress Notes (Signed)
Pharmacist Chemotherapy Monitoring - Follow Up Assessment    I verify that I have reviewed each item in the below checklist:  . Regimen for the patient is scheduled for the appropriate day and plan matches scheduled date. Marland Kitchen Appropriate non-routine labs are ordered dependent on drug ordered. . If applicable, additional medications reviewed and ordered per protocol based on lifetime cumulative doses and/or treatment regimen.   Plan for follow-up and/or issues identified: No . I-vent associated with next due treatment: No . MD and/or nursing notified: No    Kennith Center, Pharm.D., CPP 02/18/2020@4 :22 PM

## 2020-02-24 ENCOUNTER — Other Ambulatory Visit: Payer: Self-pay

## 2020-02-24 ENCOUNTER — Inpatient Hospital Stay: Payer: BC Managed Care – PPO

## 2020-02-24 VITALS — BP 127/84 | HR 70 | Temp 98.3°F | Resp 20 | Wt 212.8 lb

## 2020-02-24 DIAGNOSIS — C50811 Malignant neoplasm of overlapping sites of right female breast: Secondary | ICD-10-CM | POA: Diagnosis not present

## 2020-02-24 DIAGNOSIS — Z170 Estrogen receptor positive status [ER+]: Secondary | ICD-10-CM

## 2020-02-24 DIAGNOSIS — C7951 Secondary malignant neoplasm of bone: Secondary | ICD-10-CM | POA: Diagnosis not present

## 2020-02-24 DIAGNOSIS — C50411 Malignant neoplasm of upper-outer quadrant of right female breast: Secondary | ICD-10-CM

## 2020-02-24 DIAGNOSIS — Z95828 Presence of other vascular implants and grafts: Secondary | ICD-10-CM

## 2020-02-24 DIAGNOSIS — Z5112 Encounter for antineoplastic immunotherapy: Secondary | ICD-10-CM | POA: Diagnosis not present

## 2020-02-24 DIAGNOSIS — Z79899 Other long term (current) drug therapy: Secondary | ICD-10-CM | POA: Diagnosis not present

## 2020-02-24 LAB — COMPREHENSIVE METABOLIC PANEL
ALT: 87 U/L — ABNORMAL HIGH (ref 0–44)
AST: 73 U/L — ABNORMAL HIGH (ref 15–41)
Albumin: 3.2 g/dL — ABNORMAL LOW (ref 3.5–5.0)
Alkaline Phosphatase: 82 U/L (ref 38–126)
Anion gap: 11 (ref 5–15)
BUN: 37 mg/dL — ABNORMAL HIGH (ref 6–20)
CO2: 24 mmol/L (ref 22–32)
Calcium: 11.4 mg/dL — ABNORMAL HIGH (ref 8.9–10.3)
Chloride: 103 mmol/L (ref 98–111)
Creatinine, Ser: 1.47 mg/dL — ABNORMAL HIGH (ref 0.44–1.00)
GFR calc Af Amer: 48 mL/min — ABNORMAL LOW (ref >60)
GFR calc non Af Amer: 42 mL/min — ABNORMAL LOW (ref >60)
Glucose, Bld: 106 mg/dL — ABNORMAL HIGH (ref 70–99)
Potassium: 3.5 mmol/L (ref 3.5–5.1)
Sodium: 138 mmol/L (ref 135–145)
Total Bilirubin: 0.4 mg/dL (ref 0.3–1.2)
Total Protein: 7.4 g/dL (ref 6.5–8.1)

## 2020-02-24 LAB — CBC WITH DIFFERENTIAL/PLATELET
Abs Immature Granulocytes: 0.02 10*3/uL (ref 0.00–0.07)
Basophils Absolute: 0 10*3/uL (ref 0.0–0.1)
Basophils Relative: 1 %
Eosinophils Absolute: 0.1 10*3/uL (ref 0.0–0.5)
Eosinophils Relative: 2 %
HCT: 35.6 % — ABNORMAL LOW (ref 36.0–46.0)
Hemoglobin: 11.7 g/dL — ABNORMAL LOW (ref 12.0–15.0)
Immature Granulocytes: 0 %
Lymphocytes Relative: 22 %
Lymphs Abs: 1.2 10*3/uL (ref 0.7–4.0)
MCH: 26.7 pg (ref 26.0–34.0)
MCHC: 32.9 g/dL (ref 30.0–36.0)
MCV: 81.1 fL (ref 80.0–100.0)
Monocytes Absolute: 0.8 10*3/uL (ref 0.1–1.0)
Monocytes Relative: 16 %
Neutro Abs: 3.2 10*3/uL (ref 1.7–7.7)
Neutrophils Relative %: 59 %
Platelets: 167 10*3/uL (ref 150–400)
RBC: 4.39 MIL/uL (ref 3.87–5.11)
RDW: 15.1 % (ref 11.5–15.5)
WBC: 5.3 10*3/uL (ref 4.0–10.5)
nRBC: 0 % (ref 0.0–0.2)

## 2020-02-24 MED ORDER — HEPARIN SOD (PORK) LOCK FLUSH 100 UNIT/ML IV SOLN
500.0000 [IU] | Freq: Once | INTRAVENOUS | Status: AC | PRN
  Administered 2020-02-24: 500 [IU]
  Filled 2020-02-24: qty 5

## 2020-02-24 MED ORDER — DIPHENHYDRAMINE HCL 25 MG PO CAPS
25.0000 mg | ORAL_CAPSULE | Freq: Once | ORAL | Status: AC
  Administered 2020-02-24: 25 mg via ORAL

## 2020-02-24 MED ORDER — DIPHENHYDRAMINE HCL 25 MG PO CAPS
ORAL_CAPSULE | ORAL | Status: AC
  Filled 2020-02-24: qty 1

## 2020-02-24 MED ORDER — ACETAMINOPHEN 325 MG PO TABS
650.0000 mg | ORAL_TABLET | Freq: Once | ORAL | Status: AC
  Administered 2020-02-24: 650 mg via ORAL

## 2020-02-24 MED ORDER — SODIUM CHLORIDE 0.9% FLUSH
10.0000 mL | INTRAVENOUS | Status: DC | PRN
  Administered 2020-02-24: 10 mL via INTRAVENOUS
  Filled 2020-02-24: qty 10

## 2020-02-24 MED ORDER — SODIUM CHLORIDE 0.9% FLUSH
10.0000 mL | INTRAVENOUS | Status: DC | PRN
  Administered 2020-02-24: 10 mL
  Filled 2020-02-24: qty 10

## 2020-02-24 MED ORDER — SODIUM CHLORIDE 0.9 % IV SOLN
360.0000 mg | Freq: Once | INTRAVENOUS | Status: AC
  Administered 2020-02-24: 360 mg via INTRAVENOUS
  Filled 2020-02-24: qty 8

## 2020-02-24 MED ORDER — ACETAMINOPHEN 325 MG PO TABS
ORAL_TABLET | ORAL | Status: AC
  Filled 2020-02-24: qty 2

## 2020-02-24 MED ORDER — SODIUM CHLORIDE 0.9 % IV SOLN
Freq: Once | INTRAVENOUS | Status: AC
  Filled 2020-02-24: qty 250

## 2020-02-24 MED ORDER — HEPARIN SOD (PORK) LOCK FLUSH 100 UNIT/ML IV SOLN
500.0000 [IU] | Freq: Once | INTRAVENOUS | Status: DC
  Filled 2020-02-24: qty 5

## 2020-02-24 NOTE — Patient Instructions (Addendum)
[facility] Discharge Instructions for Patients Receiving Chemotherapy  Today you received the following chemotherapy agent: ado- Trastuzumab [facility])  To help prevent nausea and vomiting after your treatment, we encourage you to take your nausea medication as directed by your MD.   If you develop nausea and vomiting that is not controlled by your nausea medication, call the clinic.   BELOW ARE SYMPTOMS THAT SHOULD BE REPORTED IMMEDIATELY:  *FEVER GREATER THAN 100.5 F  *CHILLS WITH OR WITHOUT FEVER  NAUSEA AND VOMITING THAT IS NOT CONTROLLED WITH YOUR NAUSEA MEDICATION  *UNUSUAL SHORTNESS OF BREATH  *UNUSUAL BRUISING OR BLEEDING  TENDERNESS IN MOUTH AND THROAT WITH OR WITHOUT PRESENCE OF ULCERS  *URINARY PROBLEMS  *BOWEL PROBLEMS  UNUSUAL RASH Items with * indicate a potential emergency and should be followed up as soon as possible.  Feel free to call the clinic should you have any questions or concerns. The clinic phone number is [phone].  Please show the [place] at check-in to the Emergency Department and triage nurse.   Hypercalcemia Hypercalcemia is when the level of calcium in a person's blood is above normal. The body needs calcium to make bones and keep them strong. Calcium also helps the muscles, nerves, brain, and heart work the way they should. Most of the calcium in the body is in the bones. There is also some calcium in the blood. Hypercalcemia can happen when calcium comes out of the bones, or when the kidneys are not able to remove calcium from the blood. Hypercalcemia can be mild or severe. What are the causes? There are many possible causes of hypercalcemia. Common causes of this condition include:  Hyperparathyroidism. This is a condition in which the body produces too much parathyroid hormone. There are four parathyroid glands in your neck. These glands produce a chemical messenger (hormone) that helps the body absorb calcium  from foods and helps your bones release calcium.  Certain kinds of cancer. Less common causes of hypercalcemia include:  Getting too much calcium or vitamin D from your diet.  Kidney failure.  Hyperthyroidism.  Severe dehydration.  Being on bed rest or being inactive for a long time.  Certain medicines.  Infections. What increases the risk? You are more likely to develop this condition if you:  Are female.  Are 35 years of age or older.  Have a family history of hypercalcemia. What are the signs or symptoms? Mild hypercalcemia that starts slowly may not cause symptoms. Severe, sudden hypercalcemia is more likely to cause symptoms, such as:  Being more thirsty than usual.  Needing to urinate more often than usual.  Abdominal pain.  Nausea and vomiting.  Constipation.  Muscle pain, twitching, or weakness.  Feeling very tired. How is this diagnosed?  Hypercalcemia is usually diagnosed with a blood test. You may also have tests to help determine what is causing this condition, such as imaging tests and more blood tests. How is this treated? Treatment for hypercalcemia depends on the cause. Treatment may include:  Receiving fluids through an IV.  Medicines that: ? Keep calcium levels steady after receiving fluids (loop diuretics). ? Keep calcium in your bones (bisphosphonates). ? Lower the calcium level in your blood.  Surgery to remove overactive parathyroid glands.  A procedure that filters your blood to correct calcium levels (hemodialysis). Follow these instructions at home:   Take over-the-counter and prescription medicines only as told by your health care provider.  Follow instructions from your health care provider  about eating or drinking restrictions.  Drink enough fluid to keep your urine pale yellow.  Stay active. Weight-bearing exercise helps to keep calcium in your bones. Follow instructions from your health care provider about what type and  level of exercise is safe for you.  Keep all follow-up visits as told by your health care provider. This is important. Contact a health care provider if you have:  A fever.  A heartbeat that is irregular or very fast.  Changes in mood, memory, or personality. Get help right away if you:  Have severe abdominal pain.  Have chest pain.  Have trouble breathing.  Become very confused and sleepy.  Lose consciousness. Summary  Hypercalcemia is when the level of calcium in a person's blood is above normal. The body needs calcium to make bones and keep them strong. Calcium also helps the muscles, nerves, brain, and heart work the way they should.  There are many possible causes of hypercalcemia, and treatment depends on the cause.  Take over-the-counter and prescription medicines only as told by your health care provider.  Follow instructions from your health care provider about eating or drinking restrictions. This information is not intended to replace advice given to you by your health care provider. Make sure you discuss any questions you have with your health care provider. Document Revised: 12/15/2018 Document Reviewed: 08/24/2018 Elsevier Patient Education  2020 [place] (COVID-19) Are you at risk?  Are you at risk for the Coronavirus (COVID-19)?  To be considered HIGH RISK for Coronavirus (COVID-19), you have to meet the following criteria:  . Traveled to [place], [place], [place], [place] or [place]; or in the [place] to [place], [place], [place], or [place]; and have fever, cough, and shortness of breath within the last 2 weeks of travel OR . Been in close contact with a person diagnosed with COVID-19 within the last 2 weeks and have fever, cough, and shortness of breath . IF YOU DO NOT MEET THESE CRITERIA, YOU ARE CONSIDERED LOW RISK FOR COVID-19.  What to do if you are HIGH RISK for COVID-19?  Marland Kitchen If you are having a medical emergency,  call 911. . Seek medical care right away. Before you go to a doctor's office, urgent care or emergency department, call ahead and tell them about your recent travel, contact with someone diagnosed with COVID-19, and your symptoms. You should receive instructions from your physician's office regarding next steps of care.  . When you arrive at healthcare provider, tell the healthcare staff immediately you have returned from visiting [place], [place], [place], [place] or [place]; or traveled in the [place] to [place], [place], [place], or [place]; in the last two weeks or you have been in close contact with a person diagnosed with COVID-19 in the last 2 weeks.   . Tell the health care staff about your symptoms: fever, cough and shortness of breath. . After you have been seen by a medical provider, you will be either: o Tested for (COVID-19) and discharged home on quarantine except to seek medical care if symptoms worsen, and asked to  - Stay home and avoid contact with others until you get your results (4-5 days)  - Avoid travel on public transportation if possible (such as bus, train, or airplane) or o Sent to the Emergency Department by EMS for evaluation, COVID-19 testing, and possible admission depending on your condition and test results.  What to do if you are  LOW RISK for COVID-19?  Reduce your risk of any infection by using the same precautions used for avoiding the common cold or flu:  Marland Kitchen Wash your hands often with soap and warm water for at least 20 seconds.  If soap and water are not readily available, use an alcohol-based hand sanitizer with at least 60% alcohol.  . If coughing or sneezing, cover your mouth and nose by coughing or sneezing into the elbow areas of your shirt or coat, into a tissue or into your sleeve (not your hands). . Avoid shaking hands with others and consider head nods or verbal greetings only. . Avoid touching your eyes, nose, or mouth with unwashed hands.   . Avoid close contact with people who are sick. . Avoid places or events with large numbers of people in one location, like concerts or sporting events. . Carefully consider travel plans you have or are making. . If you are planning any travel outside or inside the [place], visit the CDC's Travelers' Health webpage for the latest health notices. . If you have some symptoms but not all symptoms, continue to monitor at home and seek medical attention if your symptoms worsen. . If you are having a medical emergency, call 911.   [place] / e-Visit: [email or web address]         [facility]: [facility]: [phone]                   [facility]: [phone]

## 2020-02-24 NOTE — Progress Notes (Signed)
Dr. Jana Hakim ok with decreasing dose of Kadcyla due to weight change.  Larene Beach, PharmD

## 2020-02-24 NOTE — Progress Notes (Signed)
Per Dr. Ron Agee, ok for treatment today with ALT 87.

## 2020-02-25 LAB — PTH, INTACT AND CALCIUM
Calcium, Total (PTH): 11.2 mg/dL — ABNORMAL HIGH (ref 8.7–10.2)
PTH: 70 pg/mL — ABNORMAL HIGH (ref 15–65)

## 2020-02-28 ENCOUNTER — Other Ambulatory Visit (HOSPITAL_COMMUNITY): Payer: Self-pay | Admitting: *Deleted

## 2020-02-28 DIAGNOSIS — C50411 Malignant neoplasm of upper-outer quadrant of right female breast: Secondary | ICD-10-CM

## 2020-02-28 DIAGNOSIS — Z170 Estrogen receptor positive status [ER+]: Secondary | ICD-10-CM

## 2020-02-28 NOTE — Progress Notes (Signed)
Pt due for echo and f/u w/Dr Bensimhon, order placed

## 2020-03-08 ENCOUNTER — Other Ambulatory Visit: Payer: Self-pay

## 2020-03-08 ENCOUNTER — Ambulatory Visit (HOSPITAL_COMMUNITY)
Admission: RE | Admit: 2020-03-08 | Discharge: 2020-03-08 | Disposition: A | Discharge status: Home / Self Care | Payer: BC Managed Care – PPO | Source: Ambulatory Visit | Attending: Internal Medicine | Admitting: Internal Medicine

## 2020-03-08 ENCOUNTER — Encounter: Payer: Self-pay | Admitting: Oncology

## 2020-03-08 ENCOUNTER — Ambulatory Visit (HOSPITAL_COMMUNITY)
Admission: RE | Admit: 2020-03-08 | Discharge: 2020-03-08 | Disposition: A | Discharge status: Home / Self Care | Payer: BC Managed Care – PPO | Source: Ambulatory Visit | Attending: Family Medicine | Admitting: Family Medicine

## 2020-03-08 ENCOUNTER — Encounter (HOSPITAL_COMMUNITY): Payer: Self-pay | Admitting: Internal Medicine

## 2020-03-08 ENCOUNTER — Ambulatory Visit (HOSPITAL_COMMUNITY)
Admission: RE | Admit: 2020-03-08 | Discharge: 2020-03-08 | Disposition: A | Discharge status: Home / Self Care | Payer: BC Managed Care – PPO | Source: Ambulatory Visit | Attending: Oncology | Admitting: Oncology

## 2020-03-08 ENCOUNTER — Other Ambulatory Visit: Payer: Self-pay | Admitting: Oncology

## 2020-03-08 VITALS — BP 151/94 | HR 97 | Wt 217.4 lb

## 2020-03-08 DIAGNOSIS — Z888 Allergy status to other drugs, medicaments and biological substances status: Secondary | ICD-10-CM | POA: Insufficient documentation

## 2020-03-08 DIAGNOSIS — Z801 Family history of malignant neoplasm of trachea, bronchus and lung: Secondary | ICD-10-CM | POA: Diagnosis not present

## 2020-03-08 DIAGNOSIS — Z807 Family history of other malignant neoplasms of lymphoid, hematopoietic and related tissues: Secondary | ICD-10-CM | POA: Diagnosis not present

## 2020-03-08 DIAGNOSIS — F419 Anxiety disorder, unspecified: Secondary | ICD-10-CM | POA: Diagnosis not present

## 2020-03-08 DIAGNOSIS — Z9221 Personal history of antineoplastic chemotherapy: Secondary | ICD-10-CM | POA: Diagnosis not present

## 2020-03-08 DIAGNOSIS — C50411 Malignant neoplasm of upper-outer quadrant of right female breast: Secondary | ICD-10-CM

## 2020-03-08 DIAGNOSIS — Z7982 Long term (current) use of aspirin: Secondary | ICD-10-CM | POA: Insufficient documentation

## 2020-03-08 DIAGNOSIS — C7951 Secondary malignant neoplasm of bone: Secondary | ICD-10-CM

## 2020-03-08 DIAGNOSIS — Z170 Estrogen receptor positive status [ER+]: Secondary | ICD-10-CM

## 2020-03-08 DIAGNOSIS — Z7189 Other specified counseling: Secondary | ICD-10-CM | POA: Insufficient documentation

## 2020-03-08 DIAGNOSIS — Z9011 Acquired absence of right breast and nipple: Secondary | ICD-10-CM | POA: Insufficient documentation

## 2020-03-08 DIAGNOSIS — Z833 Family history of diabetes mellitus: Secondary | ICD-10-CM | POA: Insufficient documentation

## 2020-03-08 DIAGNOSIS — Z923 Personal history of irradiation: Secondary | ICD-10-CM | POA: Diagnosis not present

## 2020-03-08 DIAGNOSIS — Z8673 Personal history of transient ischemic attack (TIA), and cerebral infarction without residual deficits: Secondary | ICD-10-CM | POA: Diagnosis not present

## 2020-03-08 DIAGNOSIS — Z6841 Body Mass Index (BMI) 40.0 and over, adult: Secondary | ICD-10-CM | POA: Insufficient documentation

## 2020-03-08 DIAGNOSIS — Z8249 Family history of ischemic heart disease and other diseases of the circulatory system: Secondary | ICD-10-CM | POA: Diagnosis not present

## 2020-03-08 DIAGNOSIS — R2232 Localized swelling, mass and lump, left upper limb: Secondary | ICD-10-CM | POA: Diagnosis not present

## 2020-03-08 DIAGNOSIS — I890 Lymphedema, not elsewhere classified: Secondary | ICD-10-CM

## 2020-03-08 DIAGNOSIS — Z79899 Other long term (current) drug therapy: Secondary | ICD-10-CM | POA: Diagnosis not present

## 2020-03-08 DIAGNOSIS — Z91040 Latex allergy status: Secondary | ICD-10-CM | POA: Insufficient documentation

## 2020-03-08 DIAGNOSIS — I119 Hypertensive heart disease without heart failure: Secondary | ICD-10-CM | POA: Insufficient documentation

## 2020-03-08 DIAGNOSIS — C50911 Malignant neoplasm of unspecified site of right female breast: Secondary | ICD-10-CM | POA: Diagnosis not present

## 2020-03-08 DIAGNOSIS — I10 Essential (primary) hypertension: Secondary | ICD-10-CM

## 2020-03-08 LAB — GLUCOSE, CAPILLARY: Glucose-Capillary: 91 mg/dL (ref 70–99)

## 2020-03-08 MED ORDER — FLUDEOXYGLUCOSE F - 18 (FDG) INJECTION
10.6000 | Freq: Once | INTRAVENOUS | Status: AC
  Administered 2020-03-08: 10.6 via INTRAVENOUS

## 2020-03-08 NOTE — Progress Notes (Signed)
  Echocardiogram 2D Echocardiogram has been performed.  Jennette Dubin 03/08/2020, 3:09 PM

## 2020-03-08 NOTE — Progress Notes (Signed)
Cardio-Oncology Clinic Note   Date:  03/08/2020   ID:  [name] [name], DOB [birth year], MRN [id number]  Location: Home  Provider location: [facility] Type of Visit: Established patient  PCP:  Marda Stalker, PA-C  Cardiologist:  Candee Furbish, MD Primary HF:   Chief Complaint: Heart Failure follow-up   History of Present Illness:  HPI:  [name] [name] is 49 y.o. female with h/o HTN, TIA, anxiety and left breast cancer (treated in 2000 at [facility]) who was diagnosed with triple-positive R breast cancer in 4/19. Referred by Dr. Jana Hakim for enrollment into the Cardio-Oncology program.  Cancer history/treatment plan:  (1) status post left lumpectomy in 2000 for a (?) T2N0 breast cancer, (a) status post adjuvant chemotherapy including adriamycin (b) status post adjuvant radiation (c) did not receive antiestrogens  (2) status post right breast biopsy 03/16/2018 for a clinical mT2-3 N2, anatomic stage III invasive ductal carcinoma, grade 2, triple positive, with an MIB-1 of 80%. (a) bone scan and chest CT scan negative for metastases except for a thoracic spine lytic lesion of concern  (3) neoadjuvant chemotherapy consisting of carboplatin, docetaxel, trastuzumab and Pertuzumab every 3 weeks x 6 starting 04/16/2018 (a) pertuzumab held beginning with cycle 2 because of diarrhea  (4) trastuzumab to be continued indefinitely  (5) 9/25 s/p surgical resection  (6) adjuvant radiation to the right breast completed   (7) antiestrogens to start at the completion of local treatment  She presents today for routine f/u. Now on Kadcyla as of January 2021. Getting infusion q 3weeks. Doing well. Walking 2 miles. No undue SOB. Toelrating infusions ok. PET scan today with resolution of bony mets  Echo 03/08/20 EF 65-70% Personally reviewed  Echo 11/20 EF 60-65% GLS -18.7% Personally reviewed     Echo 04/07/19 EF 60-65% GLS -17.7%   Echo 12/19 EF 60% GLS -23%  Echo 06/29/18 EF 65-70%  GLS -20.6% LS 9.5 cm/s  Echo 4/19 EF 60-65% mild LVH Grade II DD     [name] [name] denies symptoms worrisome for COVID 19.   Past Medical History:  Diagnosis Date  . Anxiety   . Breast cancer, left breast ([place]) 2000   Underwent lumpectomy, chemotherapy and radiation  . Facial paralysis/Bells palsy    left side  . Family history of lung cancer   . Family history of lymphoma   . Hypertension   . Neuropathy    IN FINGERS AND TOES   RECENT INFUSION OF CHEMO  . Personal history of breast cancer 05/06/2018  . Renal disorder    just after TIA acute kidney injury  . TIA (transient ischemic attack) 03/16/2017   [facility]   Past Surgical History:  Procedure Laterality Date  . BREAST LUMPECTOMY WITH AXILLARY LYMPH NODE BIOPSY Left 2000   biopsy  . BREAST SURGERY     partial mastectomy with lymph node removal  . MASTECTOMY Right 2019   & partial mastectomy left breast 2000  . MASTECTOMY MODIFIED RADICAL Right 08/26/2018   Procedure: MASTECTOMY MODIFIED RADICAL;  Surgeon: Jovita Kussmaul, MD;  Location: [facility];  Service: General;  Laterality: Right;  . MODIFIED MASTECTOMY Right 08/26/2018  . PORTACATH PLACEMENT Left 04/08/2018   Procedure: INSERTION PORT-A-CATH;  Surgeon: Jovita Kussmaul, MD;  Location: [facility];  Service: General;  Laterality: Left;  . TUBAL LIGATION Bilateral 2004  . WISDOM TOOTH EXTRACTION       Current Outpatient Medications  Medication Sig Dispense Refill  . anastrozole (  ARIMIDEX) 1 MG tablet Take 1 tablet (1 mg total) by mouth daily. 90 tablet 4  . aspirin EC 81 MG tablet Take 81 mg by mouth daily.    . carvedilol (COREG) 3.125 MG tablet TAKE 1 TABLET (3.125 MG TOTAL) BY MOUTH 2 (TWO) TIMES DAILY WITH A MEAL. 180 tablet 2  . Cholecalciferol (VITAMIN D3 PO) Take by mouth daily.    . Cyanocobalamin (VITAMIN B12) 500 MCG TABS Take 500 mcg by mouth daily.     . ferrous  sulfate 325 (65 FE) MG tablet Take 1 tablet (325 mg total) by mouth daily. 30 tablet 0  . lidocaine-prilocaine (EMLA) cream Apply 1 application topically as needed.    Marland Kitchen losartan (COZAAR) 50 MG tablet Take 1 tablet (50 mg total) by mouth daily. 30 tablet 11  . Multiple Vitamin (MULITIVITAMIN WITH MINERALS) TABS Take 1 tablet by mouth daily.      Marland Kitchen spironolactone-hydrochlorothiazide (ALDACTAZIDE) 25-25 MG tablet TAKE 1 TABLET BY MOUTH EVERY DAY 30 tablet 2  . venlafaxine XR (EFFEXOR-XR) 37.5 MG 24 hr capsule TAKE 1 CAPSULE BY MOUTH DAILY WITH BREAKFAST. 90 capsule 2   No current facility-administered medications for this encounter.    Allergies:   Ace inhibitors, Lisinopril, Amlodipine, Adhesive [tape], and Latex   Social History:  The patient  reports that she has never smoked. She has never used smokeless tobacco. She reports that she does not drink alcohol or use drugs.   Family History:  The patient's family history includes Alzheimer's disease in her maternal grandmother; CAD in her maternal grandmother and paternal grandmother; Diabetes in her father; Diverticulosis in her father; Hypertension in her father, maternal grandmother, mother, and paternal grandmother; Lung cancer (age of onset: 79) in her father; Lymphoma (age of onset: 30) in her paternal aunt; Pneumonia in her maternal aunt; Sickle cell trait in her paternal grandfather; Stroke in her paternal grandfather.   ROS:  Please see the history of present illness.   All other systems are personally reviewed and negative.   Vitals:   03/08/20 1527  BP: (!) 151/94  Pulse: 97  SpO2: 97%    Exam:  General:  Well appearing. No resp difficulty HEENT: normal Neck: supple. no JVD. Carotids 2+ bilat; no bruits. No lymphadenopathy or thryomegaly appreciated. Cor: PMI nondisplaced. Regular rate & rhythm. No rubs, gallops or murmurs. Lungs: clear Abdomen: obese soft, nontender, nondistended. No hepatosplenomegaly. No bruits or masses.  Good bowel sounds. Extremities: no cyanosis, clubbing, rash, edema Neuro: alert & orientedx3, cranial nerves grossly intact. moves all 4 extremities w/o difficulty. Affect pleasant   Recent Labs: 02/24/2020: ALT 87; BUN 37; Creatinine, Ser 1.47; Hemoglobin 11.7; Platelets 167; Potassium 3.5; Sodium 138  Personally reviewed   Wt Readings from Last 3 Encounters:  03/08/20 98.6 kg (217 lb 6.4 oz)  02/24/20 96.5 kg (212 lb 12 oz)  02/03/20 101.2 kg (223 lb)      ASSESSMENT AND PLAN:  1. Right Breast Cancer diagnosed 4/19 stage IV - triple positive. Found to have Stage IV. Disease in 4/20. - switched to [place] on 1/21 - I reviewed echos personally. EF and Doppler parameters stable. No HF on exam. Continue Kadcyla - Continue echo q 3 months for now. Can stpace to every 6 months as needed - PET scan today reviewed 2. HTN - now on losartan and carvedilol.  - Blood pressure high now but well controlled at the [place] carvedilol, spiro and losartan. Adjust as needed   Signed,  Glori Bickers, MD  03/08/2020 3:40 PM  [facility] [place] [address] [facility] [place] [address] [phone] (office) [phone] (fax)

## 2020-03-08 NOTE — Patient Instructions (Signed)
Your physician recommends that you schedule a follow-up appointment in: 4 months with echocardiogram

## 2020-03-10 NOTE — Progress Notes (Signed)

## 2020-03-15 NOTE — Progress Notes (Signed)
[place]  [phone] [phone]    ID: [name] [name] DOB: 1971/09/18  MR#: [id number]  FIE#:[id number]  Patient Care Team: Marda Stalker, PA-C as PCP - General (Family Medicine) Jerline Pain, MD as PCP - Cardiology (Cardiology) [place] W[place], Virgie Dad, MD as Consulting Physician (Oncology) Loreta Ave, MD as Referring Physician (Internal Medicine) Donato Heinz, MD as Consulting Physician (Nephrology) Kyung Rudd, MD as Consulting Physician (Radiation Oncology) Jovita Kussmaul, MD as Consulting Physician (General Surgery) Larey Dresser, MD as Consulting Physician (Cardiology) Mauro Kaufmann, RN as Oncology Nurse Navigator Rockwell Germany, RN as Oncology Nurse Navigator OTHER MD:   CHIEF COMPLAINT: Triple positive breast cancer (s/p right mastectomy)  CURRENT TREATMENT: T-DM1; anastrozole; Delton See   INTERVAL HISTORY: [name] returns today for follow-up and treatment of her metastatic triple positive breast cancer.  She continues on anastrozole.  At this point she does not experience hot flashes.  She took care of the vaginal dryness problem with coconut oil.  She does not have a bone density screening on file but she is receiving denosumab/Xgeva.  She tolerates this with no complications.  She also continues on T-DM1.  She has no symptoms related to this and her port is working well.  Repeat echocardiogram performed on 03/08/2020 showed an ejection fraction of 70-75%.  Since her last visit, she underwent left screening mammography with tomography at [facility] on 02/09/2020 showing: breast density category A; no evidence of malignancy.   She also underwent restaging PET scan on 03/08/2020, which showed: 9 mm subcutaneous nodule along lateral left shoulder/upper arm, suspicious for metastasis; prior mediastinal lymphadenopathy resolved, and prior hypermetabolic osseous metastases no longer evident.   REVIEW OF SYSTEMS: [name] is  working part-time and is planning to go back to full-time.  She has had both her Covid vaccination status as her husband and their 39 year old is also going to be vaccinated.  Of course a 49 year old will not be.  She is walking up to 1660 steps on certain days, partly working and partly when she walks her dog.  Detailed review of systems today was otherwise entirely negative.   HISTORY OF CURRENT ILLNESS: From the original intake note:  [name] Bold palpated a lump on 01/15/2018. She felt soreness and shooting pain under her arm and in the right breast. She underwent bilateral diagnostic mammography with tomography and right breast ultrasonography at [facility] on 03/12/2018 showing: breast density category C. The is architectural distortion at the 11 o'clock position. There is also an oval mass in the right breast anterior depth. Examination of the right axilla showed enlarged lymph nodes. Ultrasonography showed a 4.6 cm mass in the right breast upper outer quadrant posterior depth. An additional 1.2 cm oval mass in the right breast 12 o'clock middle depth. The lymph nodes in the right axillary are highly suggestive of malignancy.   Accordingly on 03/16/2018 she proceeded to biopsy of the right breast area in question. The pathology from this procedure showed ([id number]): At both the 11 and 12 o'clock positions: Invasive ductal carcinoma grade II. Ductal carcinoma in situ, high grade, with necrosis and calcifications. Prognostic indicators significant for: estrogen receptor, 90% positive and progesterone receptor, 40% positive, both with strong staining intensity. Proliferation marker Ki67 at 80%. HER2 amplified with ratios HER2/CEP17 SIGNALS 6.90 and average HER2 copies per cell 14.50  The patient's subsequent history is as detailed below.   PAST MEDICAL HISTORY: Past Medical History:  Diagnosis Date  . Anxiety   .  Breast cancer, left breast ([place]) 2000   Underwent lumpectomy, chemotherapy and  radiation  . Facial paralysis/Bells palsy    left side  . Family history of lung cancer   . Family history of lymphoma   . Hypertension   . Neuropathy    IN FINGERS AND TOES   RECENT INFUSION OF CHEMO  . Personal history of breast cancer 05/06/2018  . Renal disorder    just after TIA acute kidney injury  . TIA (transient ischemic attack) 03/16/2017   [facility]   The patient has a previous history of left breast cancer diagnosed in 2000. She was seen by [facility] in [place], [place] under Dr. Loreta Ave. According to the patient, her left breast cancer was stage II with no lymph node involvement (likely T2N0). She had chemotherapy every 3 weeks for about 6 months. She did not takes any "red drugs." She also did not take anti-estrogens (likely ER/PR negative).   TIA in 2018. She saw a neurologist as a precaution. She denies any clotting issues.    PAST SURGICAL HISTORY: Past Surgical History:  Procedure Laterality Date  . BREAST LUMPECTOMY WITH AXILLARY LYMPH NODE BIOPSY Left 2000   biopsy  . BREAST SURGERY     partial mastectomy with lymph node removal  . MASTECTOMY Right 2019   & partial mastectomy left breast 2000  . MASTECTOMY MODIFIED RADICAL Right 08/26/2018   Procedure: MASTECTOMY MODIFIED RADICAL;  Surgeon: Jovita Kussmaul, MD;  Location: [place];  Service: General;  Laterality: Right;  . MODIFIED MASTECTOMY Right 08/26/2018  . PORTACATH PLACEMENT Left 04/08/2018   Procedure: INSERTION PORT-A-CATH;  Surgeon: Jovita Kussmaul, MD;  Location: [place];  Service: General;  Laterality: Left;  . TUBAL LIGATION Bilateral 2004  . WISDOM TOOTH EXTRACTION      FAMILY HISTORY Family History  Problem Relation Age of Onset  . Hypertension Mother   . Hypertension Father   . Diverticulosis Father   . Diabetes Father   . Lung cancer Father 58       hx smoking  . Hypertension Maternal Grandmother   . CAD Maternal Grandmother   . Alzheimer's disease Maternal  Grandmother        died at 46  . Hypertension Paternal Grandmother   . CAD Paternal Grandmother   . Stroke Paternal Grandfather   . Sickle cell trait Paternal Grandfather   . Pneumonia Maternal Aunt        died at a young adult  . Lymphoma Paternal Aunt 26   The patient's father is alive at age 55 and had a history of lung cancer. The patient's mother is alive at age 71. The patient has 1 brother and no sisters. She denies a family history of breast or ovarian cancer.     GYNECOLOGIC HISTORY:  No LMP recorded. (Menstrual status: Chemotherapy). Menarche: 49 years old Age at first live birth: 50 years old She is GXP3. She is no longer having periods, which were irregular and light. Her LMP was  January 2019. She is not having hot flashes. The patient used oral contraceptive from [id number] and the Depo-provera shot from [id number] with no complications. She never used HRT.    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 Her husband, [name], is a Administrator. The patient's son, Shea Stakes age 82, works at [employer] in [place]. The patient's daughters, Lilia Pro age 2, and Levonne Spiller age 71, are students. The patient's adopted son, Vonna Kotyk age  9, is also a Ship broker.     ADVANCED DIRECTIVES:    HEALTH MAINTENANCE: Social History   Tobacco Use  . Smoking status: Never Smoker  . Smokeless tobacco: Never Used  Substance Use Topics  . Alcohol use: No  . Drug use: No     Colonoscopy: n/a  PAP: 2015  Bone density: never done   Allergies  Allergen Reactions  . Ace Inhibitors Swelling    Swelling of lips and face  . Lisinopril Swelling    Swelling of lips, face  . Amlodipine Swelling    Leg swelling  . Adhesive [Tape] Itching and Rash    Please use paper tape  . Latex Itching and Rash    Current Outpatient Medications  Medication Sig Dispense Refill  . anastrozole (ARIMIDEX) 1 MG tablet Take 1 tablet (1 mg total) by mouth daily. 90 tablet 4  . aspirin EC  81 MG tablet Take 81 mg by mouth daily.    . carvedilol (COREG) 3.125 MG tablet TAKE 1 TABLET (3.125 MG TOTAL) BY MOUTH 2 (TWO) TIMES DAILY WITH A MEAL. 180 tablet 2  . Cholecalciferol (VITAMIN D3 PO) Take by mouth daily.    . Cyanocobalamin (VITAMIN B12) 500 MCG TABS Take 500 mcg by mouth daily.     . ferrous sulfate 325 (65 FE) MG tablet Take 1 tablet (325 mg total) by mouth daily. 30 tablet 0  . lidocaine-prilocaine (EMLA) cream Apply 1 application topically as needed.    Marland Kitchen losartan (COZAAR) 50 MG tablet Take 1 tablet (50 mg total) by mouth daily. 30 tablet 11  . Multiple Vitamin (MULITIVITAMIN WITH MINERALS) TABS Take 1 tablet by mouth daily.      Marland Kitchen spironolactone-hydrochlorothiazide (ALDACTAZIDE) 25-25 MG tablet TAKE 1 TABLET BY MOUTH EVERY DAY 30 tablet 2  . venlafaxine XR (EFFEXOR-XR) 37.5 MG 24 hr capsule TAKE 1 CAPSULE BY MOUTH DAILY WITH BREAKFAST. 90 capsule 2   No current facility-administered medications for this visit.    OBJECTIVE;  African-American woman in no acute distress Vitals:   03/16/20 0824  BP: 126/77  Pulse: 79  Resp: 20  Temp: 98.3 F (36.8 C)  SpO2: 99%     Body mass index is 43.47 kg/m.   Wt Readings from Last 3 Encounters:  03/16/20 215 lb 3.2 oz (97.6 kg)  03/08/20 217 lb 6.4 oz (98.6 kg)  02/24/20 212 lb 12 oz (96.5 kg)  ECOG FS:1 - Symptomatic but completely ambulatory  Sclerae unicteric, EOMs intact Wearing a mask No cervical or supraclavicular adenopathy Lungs no rales or rhonchi Heart regular rate and rhythm Abd soft, nontender, positive bowel sounds MSK no focal spinal tenderness, no upper extremity lymphedema Neuro: nonfocal, well oriented, appropriate affect Breasts: The right breast is status post mastectomy and radiation.  There is some hyperpigmentation.  There is no evidence of local recurrence.  The left breast is benign.  Both axillae are benign Skin: Careful palpation of the entire left upper arm left shoulder/scapular area shows  no subcutaneous nodule to correlate with the finding on the recent PET scan   LAB RESULTS:  CMP     Component Value Date/Time   NA 138 02/24/2020 0830   K 3.5 02/24/2020 0830   CL 103 02/24/2020 0830   CO2 24 02/24/2020 0830   GLUCOSE 106 (H) 02/24/2020 0830   BUN 37 (H) 02/24/2020 0830   CREATININE 1.47 (H) 02/24/2020 0830   CREATININE 1.36 (H) 05/19/2019 0832   CALCIUM 11.2 (H) 02/24/2020 0830  CALCIUM 11.4 (H) 02/24/2020 0830   PROT 7.4 02/24/2020 0830   ALBUMIN 3.2 (L) 02/24/2020 0830   AST 73 (H) 02/24/2020 0830   AST 19 05/19/2019 0832   ALT 87 (H) 02/24/2020 0830   ALT 24 05/19/2019 0832   ALKPHOS 82 02/24/2020 0830   BILITOT 0.4 02/24/2020 0830   BILITOT 0.3 05/19/2019 0832   GFRNONAA 42 (L) 02/24/2020 0830   GFRNONAA 46 (L) 05/19/2019 0832   GFRAA 48 (L) 02/24/2020 0830   GFRAA 54 (L) 05/19/2019 0832    Lab Results  Component Value Date   TOTALPROTELP 7.0 02/25/2019     Lab Results  Component Value Date   KPAFRELGTCHN 76.7 (H) 02/25/2019   LAMBDASER 27.2 (H) 02/25/2019   KAPLAMBRATIO 2.82 (H) 02/25/2019    Lab Results  Component Value Date   WBC 5.5 03/16/2020   NEUTROABS 3.6 03/16/2020   HGB 11.5 (L) 03/16/2020   HCT 35.3 (L) 03/16/2020   MCV 81.3 03/16/2020   PLT 147 (L) 03/16/2020   No results found for: LABCA2  No components found for: [id number]  No results for input(s): INR in the last 168 hours.  No results found for: LABCA2  No results found for: BSW967  No results found for: RFF638  No results found for: GYK599  Lab Results  Component Value Date   CA2729 38.7 (H) 02/25/2019    No components found for: HGQUANT  No results found for: CEA1 / No results found for: CEA1   No results found for: AFPTUMOR  No results found for: CHROMOGRNA  No results found for: HGBA, HGBA2QUANT, HGBFQUANT, HGBSQUAN (Hemoglobinopathy evaluation)   No results found for: LDH  No results found for: IRON, TIBC, IRONPCTSAT (Iron and  TIBC)  No results found for: FERRITIN  Urinalysis    Component Value Date/Time   COLORURINE YELLOW 08/18/2019 [place] 08/18/2019 1420   LABSPEC 1.016 08/18/2019 1420   PHURINE 5.0 08/18/2019 1420   GLUCOSEU NEGATIVE 08/18/2019 1420   HGBUR NEGATIVE 08/18/2019 1420   BILIRUBINUR NEGATIVE 08/18/2019 1420   KETONESUR NEGATIVE 08/18/2019 1420   PROTEINUR NEGATIVE 08/18/2019 1420   UROBILINOGEN 0.2 11/29/2011 1656   NITRITE NEGATIVE 08/18/2019 1420   LEUKOCYTESUR SMALL (A) 08/18/2019 1420     STUDIES: NM PET Image Restag (PS) Skull Base To Thigh  Result Date: 03/08/2020 CLINICAL DATA:  Subsequent treatment strategy for right breast cancer with osseous metastases. EXAM: NUCLEAR MEDICINE PET SKULL BASE TO THIGH TECHNIQUE: 10.6 mCi F-18 FDG was injected intravenously. Full-ring PET imaging was performed from the skull base to thigh after the radiotracer. CT data was obtained and used for attenuation correction and anatomic localization. Fasting blood glucose: 91 mg/dl COMPARISON:  PET-CT dated 10/26/2019 FINDINGS: Mediastinal blood pool activity: SUV max 3.1 Liver activity: SUV max NA NECK: No hypermetabolic cervical lymphadenopathy. Incidental CT findings: none CHEST: No hypermetabolic thoracic lymphadenopathy, improved. Prior bilateral axillary lymph node dissection. Status post right mastectomy. Suspected postsurgical changes along the right lateral chest wall measuring approximately 1.5 x 3.1 cm (series 4/image 61), max SUV 4.3, similar to the prior with max SUV 3.7. Radiation changes in the anterior right upper lobe. No hypermetabolic pulmonary nodules. 9 mm subcutaneous nodule along the lateral left shoulder/upper arm (series 4/image 37), max SUV 5.5, suspicious. Left chest port terminates in the mid SVC. Incidental CT findings: none ABDOMEN/PELVIS: No abnormal hypermetabolism in the liver, spleen, pancreas, or adrenal glands. No hypermetabolic abdominopelvic lymphadenopathy.  Incidental CT findings: none SKELETON: No focal  hypermetabolic activity to suggest skeletal metastasis. Prior hypermetabolic osseous lesions have resolved. Incidental CT findings: none IMPRESSION: Status post right mastectomy. Additional postsurgical changes in the lateral right breast. 9 mm subcutaneous nodule along the lateral left shoulder/upper arm, suspicious for subcutaneous metastasis. At a minimum, attention on follow-up is suggested. Prior mediastinal lymphadenopathy has resolved. Prior hypermetabolic osseous metastases are no longer evident. Electronically Signed   By: Julian Hy M.D.   On: 03/08/2020 15:22   ECHOCARDIOGRAM COMPLETE  Result Date: 03/08/2020    ECHOCARDIOGRAM REPORT   Patient Name:   [name] Coffield Date of Exam: 03/08/2020 Medical Rec #:  [id number]     Height:       59.0 in Accession #:    [id number]    Weight:       212.7 lb Date of Birth:  27-Jul-1971     BSA:          1.893 m Patient Age:    55 years      BP:           127/84 mmHg Patient Gender: F             HR:           92 bpm. Exam Location:  Outpatient Procedure: 2D Echo Indications:    Chemo  History:        Patient has prior history of Echocardiogram examinations, most                 recent 10/26/2019. Breast Cancer; Risk Factors:Hypertension.  Sonographer:    Mikki Santee RDCS (AE) Referring Phys: [id number] DANIEL R BENSIMHON IMPRESSIONS  1. Left ventricular ejection fraction, by estimation, is 70 to 75%. The left ventricle has hyperdynamic function. The left ventricle has no regional wall motion abnormalities. There is mild left ventricular hypertrophy. Left ventricular diastolic parameters are consistent with Grade I diastolic dysfunction (impaired relaxation). The average left ventricular global longitudinal strain is -20.6 %.  2. Right ventricular systolic function is normal. The right ventricular size is normal. There is normal pulmonary artery systolic pressure.  3. The mitral valve is normal in structure. No  evidence of mitral valve regurgitation.  4. The aortic valve is normal in structure. Aortic valve regurgitation is not visualized.  5. The inferior vena cava is normal in size with greater than 50% respiratory variability, suggesting right atrial pressure of 3 mmHg. FINDINGS  Left Ventricle: Left ventricular ejection fraction, by estimation, is 70 to 75%. The left ventricle has hyperdynamic function. The left ventricle has no regional wall motion abnormalities. The average left ventricular global longitudinal strain is -20.6  %. The left ventricular internal cavity size was normal in size. There is mild left ventricular hypertrophy. Left ventricular diastolic parameters are consistent with Grade I diastolic dysfunction (impaired relaxation). Right Ventricle: The right ventricular size is normal. No increase in right ventricular wall thickness. Right ventricular systolic function is normal. There is normal pulmonary artery systolic pressure. The tricuspid regurgitant velocity is 2.31 m/s, and  with an assumed right atrial pressure of 3 mmHg, the estimated right ventricular systolic pressure is 02.7 mmHg. Left Atrium: Left atrial size was normal in size. Right Atrium: Right atrial size was normal in size. Pericardium: There is no evidence of pericardial effusion. Mitral Valve: The mitral valve is normal in structure. No evidence of mitral valve regurgitation. Tricuspid Valve: The tricuspid valve is normal in structure. Tricuspid valve regurgitation is trivial. Aortic Valve: The aortic valve is normal in structure. Aortic  valve regurgitation is not visualized. Pulmonic Valve: The pulmonic valve was normal in structure. Pulmonic valve regurgitation is not visualized. Aorta: The aortic root and ascending aorta are structurally normal, with no evidence of dilitation. Venous: The inferior vena cava is normal in size with greater than 50% respiratory variability, suggesting right atrial pressure of 3 mmHg. IAS/Shunts: No  atrial level shunt detected by color flow Doppler.  LEFT VENTRICLE PLAX 2D LVIDd:         4.10 cm  Diastology LVIDs:         2.40 cm  LV e' lateral:   7.18 cm/s LV PW:         1.20 cm  LV E/e' lateral: 8.3 LV IVS:        1.30 cm  LV e' medial:    6.64 cm/s LVOT diam:     2.10 cm  LV E/e' medial:  9.0 LV SV:         87 LV SV Index:   46       2D Longitudinal Strain LVOT Area:     3.46 cm 2D Strain GLS Avg:     -20.6 %  RIGHT VENTRICLE RV S prime:     16.20 cm/s TAPSE (M-mode): 1.5 cm LEFT ATRIUM             Index       RIGHT ATRIUM           Index LA diam:        3.20 cm 1.69 cm/m  RA Area:     13.70 cm LA Vol (A2C):   36.9 ml 19.49 ml/m RA Volume:   26.40 ml  13.94 ml/m LA Vol (A4C):   47.4 ml 25.03 ml/m LA Biplane Vol: 41.9 ml 22.13 ml/m  AORTIC VALVE LVOT Vmax:   137.00 cm/s LVOT Vmean:  100.000 cm/s LVOT VTI:    0.250 m  AORTA Ao Root diam: 3.10 cm MITRAL VALVE               TRICUSPID VALVE MV Area (PHT): 2.56 cm    TR Peak grad:   21.3 mmHg MV Decel Time: 296 msec    TR Vmax:        231.00 cm/s MV E velocity: 59.60 cm/s MV A velocity: 73.70 cm/s  SHUNTS MV E/A ratio:  0.81        Systemic VTI:  0.25 m                            Systemic Diam: 2.10 cm Glori Bickers MD Electronically signed by Glori Bickers MD Signature Date/Time: 03/08/2020/3:52:45 PM    Final     ELIGIBLE FOR AVAILABLE RESEARCH PROTOCOL: no   ASSESSMENT: 49 y.o. [place], [place] woman  (1) status post left lumpectomy in 2000 for a (?) T2N0 breast cancer,  (a) status post adjuvant chemotherapy  (b) status post adjuvant radiation  (c) did not receive antiestrogens  (2) genetics testing May 18, 2018 through the common Hereditary Cancers Panel + Myelodysplastic Syndrome/Leukemia Panel found no deleterious mutations in APC, ATM, AXIN2, BARD1, BLM, BMPR1A, BRCA1, BRCA2, BRIP1, CDH1, CDK4, CDKN2A (p14ARF), CDKN2A (p16INK4a), CEBPA, CHEK2, CTNNA1, DICER1, EPCAM*, GATA2, GREM1*, HRAS, KIT, MEN1, MLH1, MSH2, MSH3, MSH6, MUTYH, NBN,  NF1, PALB2, PDGFRA, PMS2, POLD1, POLE, PTEN, RAD50, RAD51C, RAD51D, RUNX1, SDHB, SDHC, SDHD, SMAD4, SMARCA4, STK11, TERC, TERT, TP53, TSC1, TSC2, VHL The following genes were evaluated for sequence changes only: HOXB13*,  NTHL1*, SDHA  (a)  3 variants of uncertain significance were identified in the genes BARD1 c.764A>G (p.Asn255Ser), BRIP1 c.2563C>T (p.Arg855Cys), and PALB2 c.3103A>T (p.Ile1035Phe).   (3) status post right breast biopsy 03/16/2018 for a clinical mT2-3 N2, anatomic stage III invasive ductal carcinoma, grade 2, triple positive, with an MIB-1 of 80%.  (a) bone scan and chest CT scan negative for metastases except for a T6 lytic lesion of concern  (b) thoracic spine MRI was ordered May 2019 but never performed.  (4) neoadjuvant chemotherapy consisting of carboplatin, docetaxel, trastuzumab and Pertuzumab every 3 weeks x 6 starting 04/16/2018  (a) pertuzumab held beginning with cycle 2 because of diarrhea  (5) trastuzumab to be continued Q4w indefinitely, discontinued after 10/21/2019 dose with progression  (a) echocardiogram on 06/29/2018 showed an ejection fraction in the 65-70% range  (b) echocardiogram 11/19/2018 showed an ejection fraction in the 60-65% range  (c) echo 04/07/2019 shows an ejection fraction in the 55-60% range  (d) echo 07/15/2019 shows EF of 60-65%  (6) right mastectomy and sentinel lymph node sampling on 08/26/2018 found a residual 2.5cm invasive ductal carcinoma, with 2 of 5 sentinel lymph nodes positive, ypT2, ypN1a; margins were clear  (7) adjuvant radiation 09/30/2018 - 11/16/2018 Site/dose:   The patient initially received a dose of 50.4 Gy in 28 fractions to the right chest wall and supraclavicular region. This was delivered using a 3-D conformal, 4 field technique. The patient then received a boost to the mastectomy scar. This delivered an additional 10 Gy in 5 fractions using an en face electron field. The total dose was 60.4   (8) anastrozole started  12/03/2018  (a) Golden on 10/01/2018 was 64.3, and estradiol 7.0, consistent with menopause  (b) referral to pelvic floor rehabilitation 12/03/2018   METASTATIC DISEASE: March 2020 (9) CT of neck and CT angiography of the chest 02/04/2019 finds the T6 lytic lesion noted May 2019 (see #3 above) is now sclerotic; a sclerotic T3 lesion is stable; there is a new lytic lesion in the manubrium  (a) PET scan 03/12/2019 shows manubrium, T3 and T5 metastases, no visceral disease  (b) biopsy of manubrial lesion planned but not done secondary to pandemic  (c) Bone scan 09/01/19 shows ? progression of disease in spine; MRI 09/02/19 shows early metastases in T10 (new), T3 and T5 stable; CT chest 09/01/19 shows 73m pulmonary nodules that are new, but non specific and warrant future follow up  (d) PET 10/26/2019 shows no lung or liver lesions; new mediastinal adenopathy, bone lesions  (10) SRS to spine, palliative treatment to sternum, from 05/13/2019 through 05/26/2019:  1. Chest_sternum // 40 Gy in 10 fractions  2. Thoracic Spine (T3) // 18 Gy in 1 fraction   3. Thoracic Spine (T5) // 18 Gy in 1 fraction  (11) denosumab/Xgeva started 09/08/2019  (12) T-DM1 started 01/15/20201  (a) echo 10/26/2019 shows stable EF at 55-60%  (b) echo 03/08/2020 shows an ejection fraction in the 70-75% range  (c) PET scan 03/08/2020 shows no active disease; a 0.9 cm subcutaneous nodule in the left shoulder area seen on the PET was not identifiable by exam 03/16/2020   PLAN: KBurundiis a little over a year out from definitive diagnosis of metastatic breast cancer.  Her disease is very well controlled in fact the PET scan is negative except for a one third of an inch subcutaneous nodule in the left shoulder which I could not identify today by palpation.  She has an excellent ejection fraction and we  are going to be following echoes every 10month from this point.  Her mammogram also was benign and density category A which is very  favorable.  Her case will be presented at tumor board 04/03/2020.  She is going to see me 3 days later for her next cycle of Kadcyla.  I would like to continue the Kadcyla every 3weeks to total 1year and after that we can go to every 4 weeks, but at this point she is having no symptoms related to her treatment and an excellent functional status  The question is whether we would want to add tucatinib or a similar tyrosine kinase inhibitor to prevent brain metastases.  I have had a great deal of difficulty getting patients to take this drug because of side effects.  We can discuss that as well at the next visit.  I am delighted that KBurundiis doing so well.  She knows to call for any other issue that may develop before then.  Total encounter time 30 minutes.*Sarajane JewsC. Naim Murtha, MD  03/16/20 8:53 AM [facility] [facility] [place] [address]Tel. [phone]   Fax. [phone]  I, KWilburn Mylar am acting as scribe for Dr. GVirgie Dad Everlene Cunning.  I, GLurline DelMD, have reviewed the above documentation for accuracy and completeness, and I agree with the above.   *Total Encounter Time as defined by the Centers for Medicare and Medicaid Services includes, in addition to the face-to-face time of a patient visit (documented in the note above) non-face-to-face time: obtaining and reviewing outside history, ordering and reviewing medications, tests or procedures, care coordination (communications with other health care professionals or caregivers) and documentation in the medical record.

## 2020-03-16 ENCOUNTER — Inpatient Hospital Stay: Payer: BC Managed Care – PPO

## 2020-03-16 ENCOUNTER — Other Ambulatory Visit: Payer: BC Managed Care – PPO

## 2020-03-16 ENCOUNTER — Inpatient Hospital Stay (HOSPITAL_BASED_OUTPATIENT_CLINIC_OR_DEPARTMENT_OTHER): Payer: BC Managed Care – PPO | Admitting: Oncology

## 2020-03-16 ENCOUNTER — Inpatient Hospital Stay: Payer: BC Managed Care – PPO | Attending: Oncology

## 2020-03-16 ENCOUNTER — Other Ambulatory Visit: Payer: Self-pay

## 2020-03-16 VITALS — BP 126/77 | HR 79 | Temp 98.3°F | Resp 20 | Ht 59.0 in | Wt 215.2 lb

## 2020-03-16 DIAGNOSIS — Z5112 Encounter for antineoplastic immunotherapy: Secondary | ICD-10-CM | POA: Insufficient documentation

## 2020-03-16 DIAGNOSIS — I890 Lymphedema, not elsewhere classified: Secondary | ICD-10-CM | POA: Diagnosis not present

## 2020-03-16 DIAGNOSIS — C50411 Malignant neoplasm of upper-outer quadrant of right female breast: Secondary | ICD-10-CM

## 2020-03-16 DIAGNOSIS — C7951 Secondary malignant neoplasm of bone: Secondary | ICD-10-CM | POA: Diagnosis not present

## 2020-03-16 DIAGNOSIS — Z79899 Other long term (current) drug therapy: Secondary | ICD-10-CM | POA: Insufficient documentation

## 2020-03-16 DIAGNOSIS — Z7189 Other specified counseling: Secondary | ICD-10-CM

## 2020-03-16 DIAGNOSIS — Z170 Estrogen receptor positive status [ER+]: Secondary | ICD-10-CM | POA: Diagnosis not present

## 2020-03-16 DIAGNOSIS — Z95828 Presence of other vascular implants and grafts: Secondary | ICD-10-CM

## 2020-03-16 DIAGNOSIS — Z6841 Body Mass Index (BMI) 40.0 and over, adult: Secondary | ICD-10-CM

## 2020-03-16 LAB — COMPREHENSIVE METABOLIC PANEL
ALT: 66 U/L — ABNORMAL HIGH (ref 0–44)
AST: 63 U/L — ABNORMAL HIGH (ref 15–41)
Albumin: 3.1 g/dL — ABNORMAL LOW (ref 3.5–5.0)
Alkaline Phosphatase: 86 U/L (ref 38–126)
Anion gap: 10 (ref 5–15)
BUN: 28 mg/dL — ABNORMAL HIGH (ref 6–20)
CO2: 23 mmol/L (ref 22–32)
Calcium: 10.8 mg/dL — ABNORMAL HIGH (ref 8.9–10.3)
Chloride: 103 mmol/L (ref 98–111)
Creatinine, Ser: 1.43 mg/dL — ABNORMAL HIGH (ref 0.44–1.00)
GFR calc Af Amer: 50 mL/min — ABNORMAL LOW (ref >60)
GFR calc non Af Amer: 43 mL/min — ABNORMAL LOW (ref >60)
Glucose, Bld: 113 mg/dL — ABNORMAL HIGH (ref 70–99)
Potassium: 3.7 mmol/L (ref 3.5–5.1)
Sodium: 136 mmol/L (ref 135–145)
Total Bilirubin: 0.4 mg/dL (ref 0.3–1.2)
Total Protein: 7.6 g/dL (ref 6.5–8.1)

## 2020-03-16 LAB — CBC WITH DIFFERENTIAL/PLATELET
Abs Immature Granulocytes: 0.01 10*3/uL (ref 0.00–0.07)
Basophils Absolute: 0 10*3/uL (ref 0.0–0.1)
Basophils Relative: 1 %
Eosinophils Absolute: 0.2 10*3/uL (ref 0.0–0.5)
Eosinophils Relative: 4 %
HCT: 35.3 % — ABNORMAL LOW (ref 36.0–46.0)
Hemoglobin: 11.5 g/dL — ABNORMAL LOW (ref 12.0–15.0)
Immature Granulocytes: 0 %
Lymphocytes Relative: 19 %
Lymphs Abs: 1.1 10*3/uL (ref 0.7–4.0)
MCH: 26.5 pg (ref 26.0–34.0)
MCHC: 32.6 g/dL (ref 30.0–36.0)
MCV: 81.3 fL (ref 80.0–100.0)
Monocytes Absolute: 0.7 10*3/uL (ref 0.1–1.0)
Monocytes Relative: 12 %
Neutro Abs: 3.6 10*3/uL (ref 1.7–7.7)
Neutrophils Relative %: 64 %
Platelets: 147 10*3/uL — ABNORMAL LOW (ref 150–400)
RBC: 4.34 MIL/uL (ref 3.87–5.11)
RDW: 15.6 % — ABNORMAL HIGH (ref 11.5–15.5)
WBC: 5.5 10*3/uL (ref 4.0–10.5)
nRBC: 0 % (ref 0.0–0.2)

## 2020-03-16 MED ORDER — SODIUM CHLORIDE 0.9 % IV SOLN
360.0000 mg | Freq: Once | INTRAVENOUS | Status: AC
  Administered 2020-03-16: 360 mg via INTRAVENOUS
  Filled 2020-03-16: qty 10

## 2020-03-16 MED ORDER — ACETAMINOPHEN 325 MG PO TABS
650.0000 mg | ORAL_TABLET | Freq: Once | ORAL | Status: AC
  Administered 2020-03-16: 650 mg via ORAL

## 2020-03-16 MED ORDER — HEPARIN SOD (PORK) LOCK FLUSH 100 UNIT/ML IV SOLN
500.0000 [IU] | Freq: Once | INTRAVENOUS | Status: AC | PRN
  Administered 2020-03-16: 500 [IU]
  Filled 2020-03-16: qty 5

## 2020-03-16 MED ORDER — ACETAMINOPHEN 325 MG PO TABS
ORAL_TABLET | ORAL | Status: AC
  Filled 2020-03-16: qty 2

## 2020-03-16 MED ORDER — SODIUM CHLORIDE 0.9% FLUSH
10.0000 mL | INTRAVENOUS | Status: DC | PRN
  Administered 2020-03-16: 10 mL
  Filled 2020-03-16: qty 10

## 2020-03-16 MED ORDER — DIPHENHYDRAMINE HCL 25 MG PO CAPS
ORAL_CAPSULE | ORAL | Status: AC
  Filled 2020-03-16: qty 1

## 2020-03-16 MED ORDER — SODIUM CHLORIDE 0.9 % IV SOLN
Freq: Once | INTRAVENOUS | Status: AC
  Filled 2020-03-16: qty 250

## 2020-03-16 MED ORDER — DENOSUMAB 120 MG/1.7ML ~~LOC~~ SOLN
120.0000 mg | Freq: Once | SUBCUTANEOUS | Status: AC
  Administered 2020-03-16: 120 mg via SUBCUTANEOUS

## 2020-03-16 MED ORDER — DIPHENHYDRAMINE HCL 25 MG PO CAPS
25.0000 mg | ORAL_CAPSULE | Freq: Once | ORAL | Status: AC
  Administered 2020-03-16: 25 mg via ORAL

## 2020-03-16 MED ORDER — DENOSUMAB 120 MG/1.7ML ~~LOC~~ SOLN
SUBCUTANEOUS | Status: AC
  Filled 2020-03-16: qty 1.7

## 2020-03-16 MED ORDER — SODIUM CHLORIDE 0.9% FLUSH
10.0000 mL | INTRAVENOUS | Status: DC | PRN
  Administered 2020-03-16: 10 mL via INTRAVENOUS
  Filled 2020-03-16: qty 10

## 2020-03-16 NOTE — Patient Instructions (Signed)
[facility] Discharge Instructions for Patients Receiving Chemotherapy  Today you received the following chemotherapy agents [place]cy[place]  To help prevent nausea and vomiting after your treatment, we encourage you to take your nausea medication as directed   If you develop nausea and vomiting that is not controlled by your nausea medication, call the clinic.   BELOW ARE SYMPTOMS THAT SHOULD BE REPORTED IMMEDIATELY:  *FEVER GREATER THAN 100.5 F  *CHILLS WITH OR WITHOUT FEVER  NAUSEA AND VOMITING THAT IS NOT CONTROLLED WITH YOUR NAUSEA MEDICATION  *UNUSUAL SHORTNESS OF BREATH  *UNUSUAL BRUISING OR BLEEDING  TENDERNESS IN MOUTH AND THROAT WITH OR WITHOUT PRESENCE OF ULCERS  *URINARY PROBLEMS  *BOWEL PROBLEMS  UNUSUAL RASH Items with * indicate a potential emergency and should be followed up as soon as possible.  Feel free to call the clinic should you have any questions or concerns. The clinic phone number is [phone].  Please show the CHEMO ALERT CARD at check-in to the Emergency Department and triage nurse.   

## 2020-03-17 LAB — FOLLICLE STIMULATING HORMONE: FSH: 80.8 m[IU]/mL

## 2020-03-20 ENCOUNTER — Telehealth: Payer: Self-pay | Admitting: Oncology

## 2020-03-20 NOTE — Telephone Encounter (Signed)
No 4/15 los. No changes made to pt's schedule.

## 2020-03-23 LAB — ESTRADIOL, ULTRA SENS: Estradiol, Sensitive: <2.5 pg/mL

## 2020-03-31 NOTE — Progress Notes (Signed)
Pharmacist Chemotherapy Monitoring - Follow Up Assessment    I verify that I have reviewed each item in the below checklist:  . Regimen for the patient is scheduled for the appropriate day and plan matches scheduled date. Marland Kitchen Appropriate non-routine labs are ordered dependent on drug ordered. . If applicable, additional medications reviewed and ordered per protocol based on lifetime cumulative doses and/or treatment regimen.   Plan for follow-up and/or issues identified: No . I-vent associated with next due treatment: No . MD and/or nursing notified: No  Romualdo Bolk [facility] 03/31/2020 11:12 AM

## 2020-04-05 NOTE — Progress Notes (Signed)
[facility]  [phone] Fax:([phone]) [phone]    ID: [name] Police DOB: 11-Sep-1971  MR#: [id number]  WCH#:[id number]  Patient Care Team: Marda Stalker, PA-C as PCP - General (Family Medicine) Jerline Pain, MD as PCP - Cardiology (Cardiology) Magrinat, Virgie Dad, MD as Consulting Physician (Oncology) Loreta Ave, MD as Referring Physician (Internal Medicine) Donato Heinz, MD as Consulting Physician (Nephrology) Kyung Rudd, MD as Consulting Physician (Radiation Oncology) Jovita Kussmaul, MD as Consulting Physician (General Surgery) Larey Dresser, MD as Consulting Physician (Cardiology) Mauro Kaufmann, RN as Oncology Nurse Navigator Rockwell Germany, RN as Oncology Nurse Navigator OTHER MD:   CHIEF COMPLAINT: Triple positive breast cancer (s/p right mastectomy)  CURRENT TREATMENT: T-DM1; anastrozole; Xgeva (Q 6 w)   INTERVAL HISTORY: [name] returns today for follow-up and treatment of her metastatic triple positive breast cancer.  She continues on anastrozole.  She is tolerating this remarkably well, with no significant side effects and she obtains it at a good price.  She does not have a bone density screening on file but she is receiving denosumab/Xgeva every 6 weeks.  She tolerates this with no complications other than mild hypocalcemia.  She takes extra calcium on treatment days to prevent this.  She also continues on T-DM1.  She has no symptoms related to this and her port is working well.  Repeat echocardiogram performed on 03/08/2020 showed an ejection fraction of 70-75%.   REVIEW OF SYSTEMS: [name] has found a job at [employer], namely in [employer].  This is wonderful.  On the other hand she has received very disturbing news regarding her adopted daughter who is now 17.  [name] is not exercising regularly although some days she does get to 5000 steps.  A detailed review of systems today was otherwise  stable   HISTORY OF CURRENT ILLNESS: From the original intake note:  [name] Runco palpated a lump on 01/15/2018. She felt soreness and shooting pain under her arm and in the right breast. She underwent bilateral diagnostic mammography with tomography and right breast ultrasonography at [facility] on 03/12/2018 showing: breast density category C. The is architectural distortion at the 11 o'clock position. There is also an oval mass in the right breast anterior depth. Examination of the right axilla showed enlarged lymph nodes. Ultrasonography showed a 4.6 cm mass in the right breast upper outer quadrant posterior depth. An additional 1.2 cm oval mass in the right breast 12 o'clock middle depth. The lymph nodes in the right axillary are highly suggestive of malignancy.   Accordingly on 03/16/2018 she proceeded to biopsy of the right breast area in question. The pathology from this procedure showed ([id number]): At both the 11 and 12 o'clock positions: Invasive ductal carcinoma grade II. Ductal carcinoma in situ, high grade, with necrosis and calcifications. Prognostic indicators significant for: estrogen receptor, 90% positive and progesterone receptor, 40% positive, both with strong staining intensity. Proliferation marker Ki67 at 80%. HER2 amplified with ratios HER2/CEP17 SIGNALS 6.90 and average HER2 copies per cell 14.50  The patient's subsequent history is as detailed below.   PAST MEDICAL HISTORY: Past Medical History:  Diagnosis Date  . Anxiety   . Breast cancer, left breast ([place]) 2000   Underwent lumpectomy, chemotherapy and radiation  . Facial paralysis/Bells palsy    left side  . Family history of lung cancer   . Family history of lymphoma   . Hypertension   . Neuropathy    IN FINGERS AND  TOES   RECENT INFUSION OF CHEMO  . Personal history of breast cancer 05/06/2018  . Renal disorder    just after TIA acute kidney injury  . TIA (transient ischemic attack) 03/16/2017   [facility]   The patient has a previous history of left breast cancer diagnosed in 2000. She was seen by [facility] in [place], [place] under Dr. Loreta Ave. According to the patient, her left breast cancer was stage II with no lymph node involvement (likely T2N0). She had chemotherapy every 3 weeks for about 6 months. She did not takes any "red drugs." She also did not take anti-estrogens (likely ER/PR negative).   TIA in 2018. She saw a neurologist as a precaution. She denies any clotting issues.    PAST SURGICAL HISTORY: Past Surgical History:  Procedure Laterality Date  . BREAST LUMPECTOMY WITH AXILLARY LYMPH NODE BIOPSY Left 2000   biopsy  . BREAST SURGERY     partial mastectomy with lymph node removal  . MASTECTOMY Right 2019   & partial mastectomy left breast 2000  . MASTECTOMY MODIFIED RADICAL Right 08/26/2018   Procedure: MASTECTOMY MODIFIED RADICAL;  Surgeon: Jovita Kussmaul, MD;  Location: [facility];  Service: General;  Laterality: Right;  . MODIFIED MASTECTOMY Right 08/26/2018  . PORTACATH PLACEMENT Left 04/08/2018   Procedure: INSERTION PORT-A-CATH;  Surgeon: Jovita Kussmaul, MD;  Location: [facility];  Service: General;  Laterality: Left;  . TUBAL LIGATION Bilateral 2004  . WISDOM TOOTH EXTRACTION      FAMILY HISTORY Family History  Problem Relation Age of Onset  . Hypertension Mother   . Hypertension Father   . Diverticulosis Father   . Diabetes Father   . Lung cancer Father 69       hx smoking  . Hypertension Maternal Grandmother   . CAD Maternal Grandmother   . Alzheimer's disease Maternal Grandmother        died at 33  . Hypertension Paternal Grandmother   . CAD Paternal Grandmother   . Stroke Paternal Grandfather   . Sickle cell trait Paternal Grandfather   . Pneumonia Maternal Aunt        died at a young adult  . Lymphoma Paternal Aunt 44   The patient's father is alive at age 5 and had a history of lung cancer. The patient's mother is alive at age  34. The patient has 1 brother and no sisters. She denies a family history of breast or ovarian cancer.     GYNECOLOGIC HISTORY:  No LMP recorded. (Menstrual status: Chemotherapy). Menarche: 49 years old Age at first live birth: 49 years old She is GXP3. She is no longer having periods, which were irregular and light. Her LMP was  January 2019. She is not having hot flashes. The patient used oral contraceptive from [id number] and the Depo-provera shot from [id number] with no complications. She never used HRT.    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  She is now working at [employer] as a Occupational psychologist.  Her husband, [name], is a Administrator. The patient's son, Shea Stakes age 17, works at [employer] in [place]. The patient's daughters, Lilia Pro age 42, and Levonne Spiller age 67, are students. The patient's adopted son, Vonna Kotyk age 84, is also a Ship broker.     ADVANCED DIRECTIVES: In the absence of any documents to the contrary the patient's husband is her healthcare power of attorney   HEALTH MAINTENANCE: Social History  Tobacco Use  . Smoking status: Never Smoker  . Smokeless tobacco: Never Used  Substance Use Topics  . Alcohol use: No  . Drug use: No     Colonoscopy: n/a  PAP: 2015  Bone density: never done   Allergies  Allergen Reactions  . Ace Inhibitors Swelling    Swelling of lips and face  . Lisinopril Swelling    Swelling of lips, face  . Amlodipine Swelling    Leg swelling  . Adhesive [Tape] Itching and Rash    Please use paper tape  . Latex Itching and Rash    Current Outpatient Medications  Medication Sig Dispense Refill  . anastrozole (ARIMIDEX) 1 MG tablet Take 1 tablet (1 mg total) by mouth daily. 90 tablet 4  . aspirin EC 81 MG tablet Take 81 mg by mouth daily.    . carvedilol (COREG) 3.125 MG tablet TAKE 1 TABLET (3.125 MG TOTAL) BY MOUTH 2 (TWO) TIMES DAILY WITH A MEAL. 180 tablet 2  . Cholecalciferol (VITAMIN D3 PO) Take by  mouth daily.    . Cyanocobalamin (VITAMIN B12) 500 MCG TABS Take 500 mcg by mouth daily.     . ferrous sulfate 325 (65 FE) MG tablet Take 1 tablet (325 mg total) by mouth daily. 30 tablet 0  . lidocaine-prilocaine (EMLA) cream Apply 1 application topically as needed.    Marland Kitchen losartan (COZAAR) 50 MG tablet Take 1 tablet (50 mg total) by mouth daily. 30 tablet 11  . Multiple Vitamin (MULITIVITAMIN WITH MINERALS) TABS Take 1 tablet by mouth daily.      Marland Kitchen spironolactone-hydrochlorothiazide (ALDACTAZIDE) 25-25 MG tablet TAKE 1 TABLET BY MOUTH EVERY DAY 30 tablet 2  . venlafaxine XR (EFFEXOR-XR) 37.5 MG 24 hr capsule TAKE 1 CAPSULE BY MOUTH DAILY WITH BREAKFAST. 90 capsule 2   No current facility-administered medications for this visit.    OBJECTIVE;  African-American woman who appears well Vitals:   04/06/20 1100  BP: (!) 154/75  Pulse: 72  Resp: 18  Temp: 98.5 F (36.9 C)  SpO2: 98%     Body mass index is 42.5 kg/m.   Wt Readings from Last 3 Encounters:  04/06/20 210 lb 6.4 oz (95.4 kg)  03/16/20 215 lb 3.2 oz (97.6 kg)  03/08/20 217 lb 6.4 oz (98.6 kg)  ECOG FS:1 - Symptomatic but completely ambulatory  Sclerae unicteric, EOMs intact Wearing a mask No cervical or supraclavicular adenopathy Lungs no rales or rhonchi Heart regular rate and rhythm Abd soft, nontender, positive bowel sounds MSK no focal spinal tenderness, chronic left upper extremity lymphedema grade 1 Neuro: nonfocal, well oriented, appropriate affect Breasts: Status post right mastectomy and right chest wall radiation.  There remains some hyperpigmentation but there is no evidence of local recurrence.  The left breast is benign left axilla is benign. Skin: The left upper extremity area that suggested a possible subcutaneous nodule on PET was unremarkable today  LAB RESULTS:  CMP     Component Value Date/Time   NA 136 03/16/2020 0816   K 3.7 03/16/2020 0816   CL 103 03/16/2020 0816   CO2 23 03/16/2020 0816    GLUCOSE 113 (H) 03/16/2020 0816   BUN 28 (H) 03/16/2020 0816   CREATININE 1.43 (H) 03/16/2020 0816   CREATININE 1.36 (H) 05/19/2019 0832   CALCIUM 10.8 (H) 03/16/2020 0816   CALCIUM 11.2 (H) 02/24/2020 0830   PROT 7.6 03/16/2020 0816   ALBUMIN 3.1 (L) 03/16/2020 0816   AST 63 (H) 03/16/2020 9678  AST 19 05/19/2019 0832   ALT 66 (H) 03/16/2020 0816   ALT 24 05/19/2019 0832   ALKPHOS 86 03/16/2020 0816   BILITOT 0.4 03/16/2020 0816   BILITOT 0.3 05/19/2019 0832   GFRNONAA 43 (L) 03/16/2020 0816   GFRNONAA 46 (L) 05/19/2019 0832   GFRAA 50 (L) 03/16/2020 0816   GFRAA 54 (L) 05/19/2019 0832    Lab Results  Component Value Date   TOTALPROTELP 7.0 02/25/2019     Lab Results  Component Value Date   KPAFRELGTCHN 76.7 (H) 02/25/2019   LAMBDASER 27.2 (H) 02/25/2019   KAPLAMBRATIO 2.82 (H) 02/25/2019    Lab Results  Component Value Date   WBC 6.1 04/06/2020   NEUTROABS 4.2 04/06/2020   HGB 11.5 (L) 04/06/2020   HCT 35.6 (L) 04/06/2020   MCV 84.0 04/06/2020   PLT 122 (L) 04/06/2020   No results found for: LABCA2  No components found for: TFTDDU202  No results for input(s): INR in the last 168 hours.  No results found for: LABCA2  No results found for: RKY706  No results found for: CBJ628  No results found for: BTD176  Lab Results  Component Value Date   CA2729 38.7 (H) 02/25/2019    No components found for: HGQUANT  No results found for: CEA1 / No results found for: CEA1   No results found for: AFPTUMOR  No results found for: CHROMOGRNA  No results found for: HGBA, HGBA2QUANT, HGBFQUANT, HGBSQUAN (Hemoglobinopathy evaluation)   No results found for: LDH  No results found for: IRON, TIBC, IRONPCTSAT (Iron and TIBC)  No results found for: FERRITIN  Urinalysis    Component Value Date/Time   COLORURINE YELLOW 08/18/2019 [place] 08/18/2019 1420   LABSPEC 1.016 08/18/2019 1420   PHURINE 5.0 08/18/2019 1420   GLUCOSEU NEGATIVE  08/18/2019 1420   HGBUR NEGATIVE 08/18/2019 1420   BILIRUBINUR NEGATIVE 08/18/2019 1420   KETONESUR NEGATIVE 08/18/2019 1420   PROTEINUR NEGATIVE 08/18/2019 1420   UROBILINOGEN 0.2 11/29/2011 1656   NITRITE NEGATIVE 08/18/2019 1420   LEUKOCYTESUR SMALL (A) 08/18/2019 1420     STUDIES: NM PET Image Restag (PS) Skull Base To Thigh  Result Date: 03/08/2020 CLINICAL DATA:  Subsequent treatment strategy for right breast cancer with osseous metastases. EXAM: NUCLEAR MEDICINE PET SKULL BASE TO THIGH TECHNIQUE: 10.6 mCi F-18 FDG was injected intravenously. Full-ring PET imaging was performed from the skull base to thigh after the radiotracer. CT data was obtained and used for attenuation correction and anatomic localization. Fasting blood glucose: 91 mg/dl COMPARISON:  PET-CT dated 10/26/2019 FINDINGS: Mediastinal blood pool activity: SUV max 3.1 Liver activity: SUV max NA NECK: No hypermetabolic cervical lymphadenopathy. Incidental CT findings: none CHEST: No hypermetabolic thoracic lymphadenopathy, improved. Prior bilateral axillary lymph node dissection. Status post right mastectomy. Suspected postsurgical changes along the right lateral chest wall measuring approximately 1.5 x 3.1 cm (series 4/image 61), max SUV 4.3, similar to the prior with max SUV 3.7. Radiation changes in the anterior right upper lobe. No hypermetabolic pulmonary nodules. 9 mm subcutaneous nodule along the lateral left shoulder/upper arm (series 4/image 37), max SUV 5.5, suspicious. Left chest port terminates in the mid SVC. Incidental CT findings: none ABDOMEN/PELVIS: No abnormal hypermetabolism in the liver, spleen, pancreas, or adrenal glands. No hypermetabolic abdominopelvic lymphadenopathy. Incidental CT findings: none SKELETON: No focal hypermetabolic activity to suggest skeletal metastasis. Prior hypermetabolic osseous lesions have resolved. Incidental CT findings: none IMPRESSION: Status post right mastectomy. Additional  postsurgical changes in the lateral  right breast. 9 mm subcutaneous nodule along the lateral left shoulder/upper arm, suspicious for subcutaneous metastasis. At a minimum, attention on follow-up is suggested. Prior mediastinal lymphadenopathy has resolved. Prior hypermetabolic osseous metastases are no longer evident. Electronically Signed   By: Julian Hy M.D.   On: 03/08/2020 15:22   ECHOCARDIOGRAM COMPLETE  Result Date: 03/08/2020    ECHOCARDIOGRAM REPORT   Patient Name:   [name] [name] Date of Exam: 03/08/2020 Medical Rec #:  [id number]     Height:       59.0 in Accession #:    [id number]    Weight:       212.7 lb Date of Birth:  [birth year]     BSA:          1.893 m Patient Age:    49 years      BP:           127/84 mmHg Patient Gender: F             HR:           92 bpm. Exam Location:  Outpatient Procedure: 2D Echo Indications:    Chemo  History:        Patient has prior history of Echocardiogram examinations, most                 recent 10/26/2019. Breast Cancer; Risk Factors:Hypertension.  Sonographer:    Mikki Santee RDCS (AE) Referring Phys: [id number] DANIEL R BENSIMHON IMPRESSIONS  1. Left ventricular ejection fraction, by estimation, is 70 to 75%. The left ventricle has hyperdynamic function. The left ventricle has no regional wall motion abnormalities. There is mild left ventricular hypertrophy. Left ventricular diastolic parameters are consistent with Grade I diastolic dysfunction (impaired relaxation). The average left ventricular global longitudinal strain is -20.6 %.  2. Right ventricular systolic function is normal. The right ventricular size is normal. There is normal pulmonary artery systolic pressure.  3. The mitral valve is normal in structure. No evidence of mitral valve regurgitation.  4. The aortic valve is normal in structure. Aortic valve regurgitation is not visualized.  5. The inferior vena cava is normal in size with greater than 50% respiratory variability, suggesting right  atrial pressure of 3 mmHg. FINDINGS  Left Ventricle: Left ventricular ejection fraction, by estimation, is 70 to 75%. The left ventricle has hyperdynamic function. The left ventricle has no regional wall motion abnormalities. The average left ventricular global longitudinal strain is -20.6  %. The left ventricular internal cavity size was normal in size. There is mild left ventricular hypertrophy. Left ventricular diastolic parameters are consistent with Grade I diastolic dysfunction (impaired relaxation). Right Ventricle: The right ventricular size is normal. No increase in right ventricular wall thickness. Right ventricular systolic function is normal. There is normal pulmonary artery systolic pressure. The tricuspid regurgitant velocity is 2.31 m/s, and  with an assumed right atrial pressure of 3 mmHg, the estimated right ventricular systolic pressure is 35.5 mmHg. Left Atrium: Left atrial size was normal in size. Right Atrium: Right atrial size was normal in size. Pericardium: There is no evidence of pericardial effusion. Mitral Valve: The mitral valve is normal in structure. No evidence of mitral valve regurgitation. Tricuspid Valve: The tricuspid valve is normal in structure. Tricuspid valve regurgitation is trivial. Aortic Valve: The aortic valve is normal in structure. Aortic valve regurgitation is not visualized. Pulmonic Valve: The pulmonic valve was normal in structure. Pulmonic valve regurgitation is not visualized. Aorta: The aortic root and ascending aorta  are structurally normal, with no evidence of dilitation. Venous: The inferior vena cava is normal in size with greater than 50% respiratory variability, suggesting right atrial pressure of 3 mmHg. IAS/Shunts: No atrial level shunt detected by color flow Doppler.  LEFT VENTRICLE PLAX 2D LVIDd:         4.10 cm  Diastology LVIDs:         2.40 cm  LV e' lateral:   7.18 cm/s LV PW:         1.20 cm  LV E/e' lateral: 8.3 LV IVS:        1.30 cm  LV e'  medial:    6.64 cm/s LVOT diam:     2.10 cm  LV E/e' medial:  9.0 LV SV:         87 LV SV Index:   46       2D Longitudinal Strain LVOT Area:     3.46 cm 2D Strain GLS Avg:     -20.6 %  RIGHT VENTRICLE RV S prime:     16.20 cm/s TAPSE (M-mode): 1.5 cm LEFT ATRIUM             Index       RIGHT ATRIUM           Index LA diam:        3.20 cm 1.69 cm/m  RA Area:     13.70 cm LA Vol (A2C):   36.9 ml 19.49 ml/m RA Volume:   26.40 ml  13.94 ml/m LA Vol (A4C):   47.4 ml 25.03 ml/m LA Biplane Vol: 41.9 ml 22.13 ml/m  AORTIC VALVE LVOT Vmax:   137.00 cm/s LVOT Vmean:  100.000 cm/s LVOT VTI:    0.250 m  AORTA Ao Root diam: 3.10 cm MITRAL VALVE               TRICUSPID VALVE MV Area (PHT): 2.56 cm    TR Peak grad:   21.3 mmHg MV Decel Time: 296 msec    TR Vmax:        231.00 cm/s MV E velocity: 59.60 cm/s MV A velocity: 73.70 cm/s  SHUNTS MV E/A ratio:  0.81        Systemic VTI:  0.25 m                            Systemic Diam: 2.10 cm Glori Bickers MD Electronically signed by Glori Bickers MD Signature Date/Time: 03/08/2020/3:52:45 PM    Final     ELIGIBLE FOR AVAILABLE RESEARCH PROTOCOL: no   ASSESSMENT: 49 y.o. [place], [place] woman  (1) status post left lumpectomy in 2000 for a (?) T2N0 breast cancer,  (a) status post adjuvant chemotherapy  (b) status post adjuvant radiation  (c) did not receive antiestrogens  (2) genetics testing May 18, 2018 through the common Hereditary Cancers Panel + Myelodysplastic Syndrome/Leukemia Panel found no deleterious mutations in APC, ATM, AXIN2, BARD1, BLM, BMPR1A, BRCA1, BRCA2, BRIP1, CDH1, CDK4, CDKN2A (p14ARF), CDKN2A (p16INK4a), CEBPA, CHEK2, CTNNA1, DICER1, EPCAM*, GATA2, GREM1*, HRAS, KIT, MEN1, MLH1, MSH2, MSH3, MSH6, MUTYH, NBN, NF1, PALB2, PDGFRA, PMS2, POLD1, POLE, PTEN, RAD50, RAD51C, RAD51D, RUNX1, SDHB, SDHC, SDHD, SMAD4, SMARCA4, STK11, TERC, TERT, TP53, TSC1, TSC2, VHL The following genes were evaluated for sequence changes only: HOXB13*, NTHL1*,  SDHA  (a)  3 variants of uncertain significance were identified in the genes BARD1 c.764A>G (p.Asn255Ser), BRIP1 c.2563C>T (p.Arg855Cys), and PALB2 c.3103A>T (p.Ile1035Phe).   (  3) status post right breast biopsy 03/16/2018 for a clinical mT2-3 N2, anatomic stage III invasive ductal carcinoma, grade 2, triple positive, with an MIB-1 of 80%.  (a) bone scan and chest CT scan negative for metastases except for a T6 lytic lesion of concern  (b) thoracic spine MRI was ordered May 2019 but never performed.  (4) neoadjuvant chemotherapy consisting of carboplatin, docetaxel, trastuzumab and Pertuzumab every 3 weeks x 6 starting 04/16/2018  (a) pertuzumab held beginning with cycle 2 because of diarrhea  (5) trastuzumab to be continued Q4w indefinitely, discontinued after 10/21/2019 dose with progression  (a) echocardiogram on 06/29/2018 showed an ejection fraction in the 65-70% range  (b) echocardiogram 11/19/2018 showed an ejection fraction in the 60-65% range  (c) echo 04/07/2019 shows an ejection fraction in the 55-60% range  (d) echo 07/15/2019 shows EF of 60-65%  (6) right mastectomy and sentinel lymph node sampling on 08/26/2018 found a residual 2.5cm invasive ductal carcinoma, with 2 of 5 sentinel lymph nodes positive, ypT2, ypN1a; margins were clear  (7) adjuvant radiation 09/30/2018 - 11/16/2018 Site/dose:   The patient initially received a dose of 50.4 Gy in 28 fractions to the right chest wall and supraclavicular region. This was delivered using a 3-D conformal, 4 field technique. The patient then received a boost to the mastectomy scar. This delivered an additional 10 Gy in 5 fractions using an en face electron field. The total dose was 60.4   (8) anastrozole started 12/03/2018  (a) [place] on 10/01/2018 was 64.3, and estradiol 7.0, consistent with menopause  (b) referral to pelvic floor rehabilitation 12/03/2018  (c) [place] on 03/16/2020 was 80.8 with estradiol less than 2.5   METASTATIC  DISEASE: March 2020 (9) CT of neck and CT angiography of the chest 02/04/2019 finds the T6 lytic lesion noted May 2019 (see #3 above) is now sclerotic; a sclerotic T3 lesion is stable; there is a new lytic lesion in the manubrium  (a) PET scan 03/12/2019 shows manubrium, T3 and T5 metastases, no visceral disease  (b) biopsy of manubrial lesion planned but not done secondary to pandemic  (c) Bone scan 09/01/19 shows ? progression of disease in spine; MRI 09/02/19 shows early metastases in T10 (new), T3 and T5 stable; CT chest 09/01/19 shows 25m pulmonary nodules that are new, but non specific and warrant future follow up  (d) PET 10/26/2019 shows no lung or liver lesions; new mediastinal adenopathy, bone lesions  (10) SRS to spine, palliative treatment to sternum, from 05/13/2019 through 05/26/2019:  1. Chest_sternum // 40 Gy in 10 fractions  2. Thoracic Spine (T3) // 18 Gy in 1 fraction   3. Thoracic Spine (T5) // 18 Gy in 1 fraction  (11) denosumab/Xgeva started 09/08/2019  (12) T-DM1 started 01/15/20201  (a) echo 10/26/2019 shows stable EF at 55-60%  (b) echo 03/08/2020 shows an ejection fraction in the 70-75% range  (c) PET scan 03/08/2020 shows no active disease; a 0.9 cm subcutaneous nodule in the left shoulder area seen on the PET was not identifiable by exam 03/16/2020   PLAN: K[place]und[place]is a little over a year from definitive diagnosis of metastatic breast cancer.  She has very well-controlled disease, with no clinical symptoms from the cancer, and she is tolerating her treatment remarkably well.  Accordingly we are continuing as before.  She will receive T-DM1 every 21days through January of next year and at that point I will switch her to every 4 weeks.  She is receiving Xgeva every 6 weeks and  we are continuing that as before.  She will be restaged shortly before her return visit with me in August and she will also have an echocardiogram prior to that  We discussed her adopted daughters  complex situation today in detail.  Total encounter time 30 minutes.Sarajane Jews C. Magrinat, MD  04/06/20 11:37 AM Medical Oncology and Hematology [facility] [place], [place] [address] Tel. [phone]    Fax. [phone]   I, Wilburn Mylar, am acting as scribe for Dr. Virgie Dad. Magrinat.  I, Lurline Del MD, have reviewed the above documentation for accuracy and completeness, and I agree with the above.   *Total Encounter Time as defined by the Centers for Medicare and Medicaid Services includes, in addition to the face-to-face time of a patient visit (documented in the note above) non-face-to-face time: obtaining and reviewing outside history, ordering and reviewing medications, tests or procedures, care coordination (communications with other health care professionals or caregivers) and documentation in the medical record.

## 2020-04-06 ENCOUNTER — Inpatient Hospital Stay: Payer: BC Managed Care – PPO | Attending: Oncology

## 2020-04-06 ENCOUNTER — Inpatient Hospital Stay: Payer: BC Managed Care – PPO

## 2020-04-06 ENCOUNTER — Other Ambulatory Visit: Payer: Self-pay

## 2020-04-06 ENCOUNTER — Inpatient Hospital Stay (HOSPITAL_BASED_OUTPATIENT_CLINIC_OR_DEPARTMENT_OTHER): Payer: BC Managed Care – PPO | Admitting: Oncology

## 2020-04-06 VITALS — BP 154/75 | HR 72 | Temp 98.5°F | Resp 18 | Ht 59.0 in | Wt 210.4 lb

## 2020-04-06 VITALS — BP 155/79 | HR 74 | Temp 98.4°F | Resp 16

## 2020-04-06 DIAGNOSIS — Z7189 Other specified counseling: Secondary | ICD-10-CM | POA: Diagnosis not present

## 2020-04-06 DIAGNOSIS — Z95828 Presence of other vascular implants and grafts: Secondary | ICD-10-CM

## 2020-04-06 DIAGNOSIS — Z170 Estrogen receptor positive status [ER+]: Secondary | ICD-10-CM

## 2020-04-06 DIAGNOSIS — C7951 Secondary malignant neoplasm of bone: Secondary | ICD-10-CM

## 2020-04-06 DIAGNOSIS — C50411 Malignant neoplasm of upper-outer quadrant of right female breast: Secondary | ICD-10-CM

## 2020-04-06 DIAGNOSIS — Z6841 Body Mass Index (BMI) 40.0 and over, adult: Secondary | ICD-10-CM

## 2020-04-06 DIAGNOSIS — Z79899 Other long term (current) drug therapy: Secondary | ICD-10-CM | POA: Diagnosis not present

## 2020-04-06 DIAGNOSIS — Z5112 Encounter for antineoplastic immunotherapy: Secondary | ICD-10-CM | POA: Diagnosis not present

## 2020-04-06 DIAGNOSIS — C50811 Malignant neoplasm of overlapping sites of right female breast: Secondary | ICD-10-CM | POA: Diagnosis not present

## 2020-04-06 LAB — CBC WITH DIFFERENTIAL/PLATELET
Abs Immature Granulocytes: 0.02 10*3/uL (ref 0.00–0.07)
Basophils Absolute: 0 10*3/uL (ref 0.0–0.1)
Basophils Relative: 1 %
Eosinophils Absolute: 0.1 10*3/uL (ref 0.0–0.5)
Eosinophils Relative: 1 %
HCT: 35.6 % — ABNORMAL LOW (ref 36.0–46.0)
Hemoglobin: 11.5 g/dL — ABNORMAL LOW (ref 12.0–15.0)
Immature Granulocytes: 0 %
Lymphocytes Relative: 21 %
Lymphs Abs: 1.3 10*3/uL (ref 0.7–4.0)
MCH: 27.1 pg (ref 26.0–34.0)
MCHC: 32.3 g/dL (ref 30.0–36.0)
MCV: 84 fL (ref 80.0–100.0)
Monocytes Absolute: 0.6 10*3/uL (ref 0.1–1.0)
Monocytes Relative: 9 %
Neutro Abs: 4.2 10*3/uL (ref 1.7–7.7)
Neutrophils Relative %: 68 %
Platelets: 122 10*3/uL — ABNORMAL LOW (ref 150–400)
RBC: 4.24 MIL/uL (ref 3.87–5.11)
RDW: 16.6 % — ABNORMAL HIGH (ref 11.5–15.5)
WBC: 6.1 10*3/uL (ref 4.0–10.5)
nRBC: 0 % (ref 0.0–0.2)

## 2020-04-06 LAB — COMPREHENSIVE METABOLIC PANEL
ALT: 61 U/L — ABNORMAL HIGH (ref 0–44)
AST: 63 U/L — ABNORMAL HIGH (ref 15–41)
Albumin: 2.9 g/dL — ABNORMAL LOW (ref 3.5–5.0)
Alkaline Phosphatase: 92 U/L (ref 38–126)
Anion gap: 12 (ref 5–15)
BUN: 23 mg/dL — ABNORMAL HIGH (ref 6–20)
CO2: 26 mmol/L (ref 22–32)
Calcium: 11 mg/dL — ABNORMAL HIGH (ref 8.9–10.3)
Chloride: 104 mmol/L (ref 98–111)
Creatinine, Ser: 1.44 mg/dL — ABNORMAL HIGH (ref 0.44–1.00)
GFR calc Af Amer: 50 mL/min — ABNORMAL LOW (ref >60)
GFR calc non Af Amer: 43 mL/min — ABNORMAL LOW (ref >60)
Glucose, Bld: 100 mg/dL — ABNORMAL HIGH (ref 70–99)
Potassium: 4.2 mmol/L (ref 3.5–5.1)
Sodium: 142 mmol/L (ref 135–145)
Total Bilirubin: 0.4 mg/dL (ref 0.3–1.2)
Total Protein: 7.8 g/dL (ref 6.5–8.1)

## 2020-04-06 MED ORDER — SODIUM CHLORIDE 0.9% FLUSH
10.0000 mL | INTRAVENOUS | Status: DC | PRN
  Administered 2020-04-06: 10 mL
  Filled 2020-04-06: qty 10

## 2020-04-06 MED ORDER — ACETAMINOPHEN 325 MG PO TABS
650.0000 mg | ORAL_TABLET | Freq: Once | ORAL | Status: AC
  Administered 2020-04-06: 650 mg via ORAL

## 2020-04-06 MED ORDER — SODIUM CHLORIDE 0.9 % IV SOLN
3.4000 mg/kg | Freq: Once | INTRAVENOUS | Status: AC
  Administered 2020-04-06: 360 mg via INTRAVENOUS
  Filled 2020-04-06: qty 16

## 2020-04-06 MED ORDER — SODIUM CHLORIDE 0.9 % IV SOLN
Freq: Once | INTRAVENOUS | Status: AC
  Filled 2020-04-06: qty 250

## 2020-04-06 MED ORDER — HEPARIN SOD (PORK) LOCK FLUSH 100 UNIT/ML IV SOLN
500.0000 [IU] | Freq: Once | INTRAVENOUS | Status: AC | PRN
  Administered 2020-04-06: 500 [IU]
  Filled 2020-04-06: qty 5

## 2020-04-06 MED ORDER — DIPHENHYDRAMINE HCL 25 MG PO CAPS
25.0000 mg | ORAL_CAPSULE | Freq: Once | ORAL | Status: AC
  Administered 2020-04-06: 25 mg via ORAL

## 2020-04-06 MED ORDER — DIPHENHYDRAMINE HCL 25 MG PO CAPS
ORAL_CAPSULE | ORAL | Status: AC
  Filled 2020-04-06: qty 1

## 2020-04-06 MED ORDER — SODIUM CHLORIDE 0.9% FLUSH
10.0000 mL | INTRAVENOUS | Status: DC | PRN
  Administered 2020-04-06: 10 mL via INTRAVENOUS
  Filled 2020-04-06: qty 10

## 2020-04-06 MED ORDER — ACETAMINOPHEN 325 MG PO TABS
ORAL_TABLET | ORAL | Status: AC
  Filled 2020-04-06: qty 2

## 2020-04-06 NOTE — Patient Instructions (Signed)
[facility] Discharge Instructions for Patients Receiving Chemotherapy  Today you received the following chemotherapy agents Kadcyla  To help prevent nausea and vomiting after your treatment, we encourage you to take your nausea medication as directed   If you develop nausea and vomiting that is not controlled by your nausea medication, call the clinic.   BELOW ARE SYMPTOMS THAT SHOULD BE REPORTED IMMEDIATELY:  *FEVER GREATER THAN 100.5 F  *CHILLS WITH OR WITHOUT FEVER  NAUSEA AND VOMITING THAT IS NOT CONTROLLED WITH YOUR NAUSEA MEDICATION  *UNUSUAL SHORTNESS OF BREATH  *UNUSUAL BRUISING OR BLEEDING  TENDERNESS IN MOUTH AND THROAT WITH OR WITHOUT PRESENCE OF ULCERS  *URINARY PROBLEMS  *BOWEL PROBLEMS  UNUSUAL RASH Items with * indicate a potential emergency and should be followed up as soon as possible.  Feel free to call the clinic should you have any questions or concerns. The clinic phone number is [phone].  Please show the CHEMO ALERT CARD at check-in to the Emergency Department and triage nurse.   

## 2020-04-06 NOTE — Patient Instructions (Signed)

## 2020-04-10 ENCOUNTER — Telehealth: Payer: Self-pay | Admitting: Oncology

## 2020-04-10 NOTE — Telephone Encounter (Signed)
Scheduled appts per 5/6 los. Left voicemail with next appt date and time.

## 2020-04-20 ENCOUNTER — Telehealth: Payer: Self-pay | Admitting: Radiation Therapy

## 2020-04-20 NOTE — Telephone Encounter (Signed)
Left a message requesting a call back to schedule her thoracic spine follow-up MRI and visit. Have attempted multiple times without a call back from the patient.    Mont Dutton R.T.(R)(T) Radiation Special Procedures Navigator

## 2020-04-27 ENCOUNTER — Inpatient Hospital Stay: Payer: BC Managed Care – PPO

## 2020-05-04 ENCOUNTER — Telehealth: Payer: Self-pay | Admitting: Oncology

## 2020-05-04 ENCOUNTER — Telehealth: Payer: Self-pay | Admitting: *Deleted

## 2020-05-04 NOTE — Telephone Encounter (Signed)
Pt left VM stating she thought her treatment was today ( " I thought at my visit he said we would be going to every 4 weeks ) and then checked my chart and saw she missed an appointment on 5/26.  Per MD dictation from last visit- pt will continue every 3 weeks and then in Jan 2022 go to monthly.  This RN returned call to pt - obtained VM- message left informing her of above and treatment room does not have any openings presently. Above will be reviewed with MD and call returned to pt.

## 2020-05-04 NOTE — Telephone Encounter (Signed)
Scheduled per 6/3 sch message. Unable to reach pt. Left voicemail- appts r/s on 6/17, 7/8 and 7/29.  Pt prefers later time for appts in June due to work schedule. Pt also requested early appts in July. Could only schedule appt on  6/17 at that time.

## 2020-05-04 NOTE — Telephone Encounter (Signed)
Per MD review this RN spoke with pt per appointments with plan to proceed with scheduled treatment on 6/17 and then every 3 weeks until Jan 2022.  Of note [name] [name] states she as started a new job at [employer] in [employer]- She states she is still in training and needs appt on 6/17 to as late in day as possible and then in Aug needs early AM due to work schedule is 230-11pm.  This RN sent an appointment request for above changes.  No further needs at this time.

## 2020-05-12 ENCOUNTER — Encounter: Payer: Self-pay | Admitting: Cardiology

## 2020-05-12 NOTE — Progress Notes (Signed)
Pharmacist Chemotherapy Monitoring - Follow Up Assessment    I verify that I have reviewed each item in the below checklist:  . Regimen for the patient is scheduled for the appropriate day and plan matches scheduled date. Marland Kitchen Appropriate non-routine labs are ordered dependent on drug ordered. . If applicable, additional medications reviewed and ordered per protocol based on lifetime cumulative doses and/or treatment regimen.   Plan for follow-up and/or issues identified: Yes . I-vent associated with next due treatment: Yes . MD and/or nursing notified: No   Kennith Center, Pharm.D., CPP 05/12/2020@5 :12 PM

## 2020-05-18 ENCOUNTER — Inpatient Hospital Stay: Payer: BC Managed Care – PPO

## 2020-05-18 ENCOUNTER — Other Ambulatory Visit: Payer: BC Managed Care – PPO

## 2020-05-18 ENCOUNTER — Encounter: Payer: Self-pay | Admitting: Adult Health

## 2020-05-18 ENCOUNTER — Other Ambulatory Visit: Payer: Self-pay

## 2020-05-18 ENCOUNTER — Ambulatory Visit (HOSPITAL_BASED_OUTPATIENT_CLINIC_OR_DEPARTMENT_OTHER): Payer: BC Managed Care – PPO | Admitting: Medical

## 2020-05-18 ENCOUNTER — Inpatient Hospital Stay: Payer: BC Managed Care – PPO | Attending: Oncology

## 2020-05-18 ENCOUNTER — Ambulatory Visit: Payer: BC Managed Care – PPO

## 2020-05-18 VITALS — BP 151/88 | HR 68 | Temp 98.5°F | Resp 18

## 2020-05-18 DIAGNOSIS — Z5112 Encounter for antineoplastic immunotherapy: Secondary | ICD-10-CM | POA: Diagnosis not present

## 2020-05-18 DIAGNOSIS — Z79899 Other long term (current) drug therapy: Secondary | ICD-10-CM | POA: Insufficient documentation

## 2020-05-18 DIAGNOSIS — C50411 Malignant neoplasm of upper-outer quadrant of right female breast: Secondary | ICD-10-CM

## 2020-05-18 DIAGNOSIS — Z170 Estrogen receptor positive status [ER+]: Secondary | ICD-10-CM

## 2020-05-18 DIAGNOSIS — C50811 Malignant neoplasm of overlapping sites of right female breast: Secondary | ICD-10-CM | POA: Diagnosis not present

## 2020-05-18 DIAGNOSIS — C7951 Secondary malignant neoplasm of bone: Secondary | ICD-10-CM

## 2020-05-18 DIAGNOSIS — Z95828 Presence of other vascular implants and grafts: Secondary | ICD-10-CM

## 2020-05-18 LAB — COMPREHENSIVE METABOLIC PANEL
ALT: 100 U/L — ABNORMAL HIGH (ref 0–44)
AST: 102 U/L — ABNORMAL HIGH (ref 15–41)
Albumin: 3.4 g/dL — ABNORMAL LOW (ref 3.5–5.0)
Alkaline Phosphatase: 101 U/L (ref 38–126)
Anion gap: 9 (ref 5–15)
BUN: 26 mg/dL — ABNORMAL HIGH (ref 6–20)
CO2: 25 mmol/L (ref 22–32)
Calcium: 11.6 mg/dL — ABNORMAL HIGH (ref 8.9–10.3)
Chloride: 102 mmol/L (ref 98–111)
Creatinine, Ser: 1.32 mg/dL — ABNORMAL HIGH (ref 0.44–1.00)
GFR calc Af Amer: 55 mL/min — ABNORMAL LOW (ref >60)
GFR calc non Af Amer: 48 mL/min — ABNORMAL LOW (ref >60)
Glucose, Bld: 90 mg/dL (ref 70–99)
Potassium: 3.9 mmol/L (ref 3.5–5.1)
Sodium: 136 mmol/L (ref 135–145)
Total Bilirubin: 0.4 mg/dL (ref 0.3–1.2)
Total Protein: 8.2 g/dL — ABNORMAL HIGH (ref 6.5–8.1)

## 2020-05-18 LAB — CBC WITH DIFFERENTIAL/PLATELET
Abs Immature Granulocytes: 0.02 10*3/uL (ref 0.00–0.07)
Basophils Absolute: 0 10*3/uL (ref 0.0–0.1)
Basophils Relative: 1 %
Eosinophils Absolute: 0.1 10*3/uL (ref 0.0–0.5)
Eosinophils Relative: 2 %
HCT: 37.3 % (ref 36.0–46.0)
Hemoglobin: 12 g/dL (ref 12.0–15.0)
Immature Granulocytes: 0 %
Lymphocytes Relative: 28 %
Lymphs Abs: 1.6 10*3/uL (ref 0.7–4.0)
MCH: 26.8 pg (ref 26.0–34.0)
MCHC: 32.2 g/dL (ref 30.0–36.0)
MCV: 83.4 fL (ref 80.0–100.0)
Monocytes Absolute: 0.6 10*3/uL (ref 0.1–1.0)
Monocytes Relative: 11 %
Neutro Abs: 3.2 10*3/uL (ref 1.7–7.7)
Neutrophils Relative %: 58 %
Platelets: 116 10*3/uL — ABNORMAL LOW (ref 150–400)
RBC: 4.47 MIL/uL (ref 3.87–5.11)
RDW: 16.9 % — ABNORMAL HIGH (ref 11.5–15.5)
WBC: 5.5 10*3/uL (ref 4.0–10.5)
nRBC: 0 % (ref 0.0–0.2)

## 2020-05-18 MED ORDER — DIPHENHYDRAMINE HCL 25 MG PO CAPS
25.0000 mg | ORAL_CAPSULE | Freq: Once | ORAL | Status: AC
  Administered 2020-05-18: 25 mg via ORAL

## 2020-05-18 MED ORDER — SODIUM CHLORIDE 0.9% FLUSH
10.0000 mL | INTRAVENOUS | Status: DC | PRN
  Administered 2020-05-18: 10 mL
  Filled 2020-05-18: qty 10

## 2020-05-18 MED ORDER — DIPHENHYDRAMINE HCL 25 MG PO CAPS
ORAL_CAPSULE | ORAL | Status: AC
  Filled 2020-05-18: qty 1

## 2020-05-18 MED ORDER — SODIUM CHLORIDE 0.9 % IV SOLN
3.4000 mg/kg | Freq: Once | INTRAVENOUS | Status: AC
  Administered 2020-05-18: 360 mg via INTRAVENOUS
  Filled 2020-05-18: qty 18
  Filled 2020-05-18: qty 8

## 2020-05-18 MED ORDER — ACETAMINOPHEN 325 MG PO TABS
ORAL_TABLET | ORAL | Status: AC
  Filled 2020-05-18: qty 2

## 2020-05-18 MED ORDER — SODIUM CHLORIDE 0.9 % IV SOLN
Freq: Once | INTRAVENOUS | Status: AC
  Filled 2020-05-18: qty 250

## 2020-05-18 MED ORDER — ACETAMINOPHEN 325 MG PO TABS
650.0000 mg | ORAL_TABLET | Freq: Once | ORAL | Status: AC
  Administered 2020-05-18: 650 mg via ORAL

## 2020-05-18 MED ORDER — SODIUM CHLORIDE 0.9% FLUSH
10.0000 mL | Freq: Once | INTRAVENOUS | Status: AC
  Administered 2020-05-18: 10 mL
  Filled 2020-05-18: qty 10

## 2020-05-18 MED ORDER — HEPARIN SOD (PORK) LOCK FLUSH 100 UNIT/ML IV SOLN
500.0000 [IU] | Freq: Once | INTRAVENOUS | Status: AC | PRN
  Administered 2020-05-18: 500 [IU]
  Filled 2020-05-18: qty 5

## 2020-05-18 NOTE — Progress Notes (Signed)
Patient stopped by to inquire about Kadcycla copay assistance.  Reviewed notes and went to infusion to advise patient that drug ws added to her current assistance on 02/08/20. She was very Patent attorney.

## 2020-05-18 NOTE — Progress Notes (Signed)
Per Dr. Jana Hakim, okay to treat with AST 102 and ALT 100.

## 2020-05-18 NOTE — Progress Notes (Signed)
Kadcyla arcode was not able to be scanned in infusion due to error in rejection in dispense prep process. Teldrin confirmed product was dispensed/checked appropriately by [facility] via product review process.   Demetrius Charity, PharmD, BCPS, [facility] Pharmacist Pharmacy Phone: [phone] 05/18/2020

## 2020-05-18 NOTE — Patient Instructions (Signed)
[facility] Discharge Instructions for Patients Receiving Chemotherapy  Today you received the following chemotherapy agents Kadcyla  To help prevent nausea and vomiting after your treatment, we encourage you to take your nausea medication as directed   If you develop nausea and vomiting that is not controlled by your nausea medication, call the clinic.   BELOW ARE SYMPTOMS THAT SHOULD BE REPORTED IMMEDIATELY:  *FEVER GREATER THAN 100.5 F  *CHILLS WITH OR WITHOUT FEVER  NAUSEA AND VOMITING THAT IS NOT CONTROLLED WITH YOUR NAUSEA MEDICATION  *UNUSUAL SHORTNESS OF BREATH  *UNUSUAL BRUISING OR BLEEDING  TENDERNESS IN MOUTH AND THROAT WITH OR WITHOUT PRESENCE OF ULCERS  *URINARY PROBLEMS  *BOWEL PROBLEMS  UNUSUAL RASH Items with * indicate a potential emergency and should be followed up as soon as possible.  Feel free to call the clinic should you have any questions or concerns. The clinic phone number is [phone].  Please show the CHEMO ALERT CARD at check-in to the Emergency Department and triage nurse.   

## 2020-05-25 NOTE — Progress Notes (Signed)
The patient was seen in the infusion room today for left great toe pain.  The pain and swelling more localized over the left PIP joint.  The end of the great left toe did not appear erythematous and was not tender to the touch.  There was no increased warmth noted.  It did not appear that the patient had gout.  She was told that she might wish to get a gel insert for her shoes.  She expressed understanding and agreement with this plan.  Sandi Mealy, MHS, PA-C Physician Assistant

## 2020-05-30 ENCOUNTER — Other Ambulatory Visit: Payer: Self-pay | Admitting: Oncology

## 2020-06-01 DIAGNOSIS — M7989 Other specified soft tissue disorders: Secondary | ICD-10-CM | POA: Diagnosis not present

## 2020-06-01 DIAGNOSIS — I10 Essential (primary) hypertension: Secondary | ICD-10-CM | POA: Diagnosis not present

## 2020-06-01 DIAGNOSIS — M25572 Pain in left ankle and joints of left foot: Secondary | ICD-10-CM | POA: Diagnosis not present

## 2020-06-01 DIAGNOSIS — M79672 Pain in left foot: Secondary | ICD-10-CM | POA: Diagnosis not present

## 2020-06-01 DIAGNOSIS — M19072 Primary osteoarthritis, left ankle and foot: Secondary | ICD-10-CM | POA: Diagnosis not present

## 2020-06-08 ENCOUNTER — Other Ambulatory Visit: Payer: Self-pay | Admitting: *Deleted

## 2020-06-08 ENCOUNTER — Other Ambulatory Visit: Payer: Self-pay | Admitting: Oncology

## 2020-06-08 ENCOUNTER — Inpatient Hospital Stay: Payer: BC Managed Care – PPO

## 2020-06-08 ENCOUNTER — Ambulatory Visit: Payer: BC Managed Care – PPO

## 2020-06-08 ENCOUNTER — Inpatient Hospital Stay: Payer: BC Managed Care – PPO | Attending: Oncology

## 2020-06-08 ENCOUNTER — Telehealth: Payer: Self-pay | Admitting: Oncology

## 2020-06-08 ENCOUNTER — Telehealth: Payer: Self-pay | Admitting: *Deleted

## 2020-06-08 ENCOUNTER — Other Ambulatory Visit: Payer: BC Managed Care – PPO

## 2020-06-08 ENCOUNTER — Other Ambulatory Visit: Payer: Self-pay

## 2020-06-08 VITALS — BP 124/74 | HR 85 | Temp 98.6°F | Resp 18 | Wt 200.0 lb

## 2020-06-08 DIAGNOSIS — Z170 Estrogen receptor positive status [ER+]: Secondary | ICD-10-CM

## 2020-06-08 DIAGNOSIS — Z79811 Long term (current) use of aromatase inhibitors: Secondary | ICD-10-CM | POA: Insufficient documentation

## 2020-06-08 DIAGNOSIS — C7951 Secondary malignant neoplasm of bone: Secondary | ICD-10-CM | POA: Diagnosis not present

## 2020-06-08 DIAGNOSIS — Z923 Personal history of irradiation: Secondary | ICD-10-CM | POA: Insufficient documentation

## 2020-06-08 DIAGNOSIS — Z9011 Acquired absence of right breast and nipple: Secondary | ICD-10-CM | POA: Diagnosis not present

## 2020-06-08 DIAGNOSIS — C50411 Malignant neoplasm of upper-outer quadrant of right female breast: Secondary | ICD-10-CM | POA: Diagnosis not present

## 2020-06-08 DIAGNOSIS — T451X5A Adverse effect of antineoplastic and immunosuppressive drugs, initial encounter: Secondary | ICD-10-CM | POA: Insufficient documentation

## 2020-06-08 DIAGNOSIS — I10 Essential (primary) hypertension: Secondary | ICD-10-CM | POA: Insufficient documentation

## 2020-06-08 DIAGNOSIS — E109 Type 1 diabetes mellitus without complications: Secondary | ICD-10-CM | POA: Insufficient documentation

## 2020-06-08 DIAGNOSIS — Z5112 Encounter for antineoplastic immunotherapy: Secondary | ICD-10-CM | POA: Diagnosis not present

## 2020-06-08 DIAGNOSIS — Z95828 Presence of other vascular implants and grafts: Secondary | ICD-10-CM

## 2020-06-08 DIAGNOSIS — Z79899 Other long term (current) drug therapy: Secondary | ICD-10-CM | POA: Insufficient documentation

## 2020-06-08 DIAGNOSIS — R197 Diarrhea, unspecified: Secondary | ICD-10-CM | POA: Insufficient documentation

## 2020-06-08 DIAGNOSIS — F419 Anxiety disorder, unspecified: Secondary | ICD-10-CM | POA: Insufficient documentation

## 2020-06-08 LAB — CBC WITH DIFFERENTIAL/PLATELET
Abs Immature Granulocytes: 0.02 10*3/uL (ref 0.00–0.07)
Basophils Absolute: 0 10*3/uL (ref 0.0–0.1)
Basophils Relative: 1 %
Eosinophils Absolute: 0.1 10*3/uL (ref 0.0–0.5)
Eosinophils Relative: 1 %
HCT: 37.9 % (ref 36.0–46.0)
Hemoglobin: 12.4 g/dL (ref 12.0–15.0)
Immature Granulocytes: 0 %
Lymphocytes Relative: 23 %
Lymphs Abs: 1.3 10*3/uL (ref 0.7–4.0)
MCH: 27 pg (ref 26.0–34.0)
MCHC: 32.7 g/dL (ref 30.0–36.0)
MCV: 82.6 fL (ref 80.0–100.0)
Monocytes Absolute: 0.6 10*3/uL (ref 0.1–1.0)
Monocytes Relative: 11 %
Neutro Abs: 3.6 10*3/uL (ref 1.7–7.7)
Neutrophils Relative %: 64 %
Platelets: 168 10*3/uL (ref 150–400)
RBC: 4.59 MIL/uL (ref 3.87–5.11)
RDW: 16.8 % — ABNORMAL HIGH (ref 11.5–15.5)
WBC: 5.6 10*3/uL (ref 4.0–10.5)
nRBC: 0 % (ref 0.0–0.2)

## 2020-06-08 LAB — COMPREHENSIVE METABOLIC PANEL
ALT: 102 U/L — ABNORMAL HIGH (ref 0–44)
AST: 100 U/L — ABNORMAL HIGH (ref 15–41)
Albumin: 3.1 g/dL — ABNORMAL LOW (ref 3.5–5.0)
Alkaline Phosphatase: 83 U/L (ref 38–126)
Anion gap: 11 (ref 5–15)
BUN: 55 mg/dL — ABNORMAL HIGH (ref 6–20)
CO2: 22 mmol/L (ref 22–32)
Calcium: 11.3 mg/dL — ABNORMAL HIGH (ref 8.9–10.3)
Chloride: 106 mmol/L (ref 98–111)
Creatinine, Ser: 1.74 mg/dL — ABNORMAL HIGH (ref 0.44–1.00)
GFR calc Af Amer: 39 mL/min — ABNORMAL LOW (ref >60)
GFR calc non Af Amer: 34 mL/min — ABNORMAL LOW (ref >60)
Glucose, Bld: 124 mg/dL — ABNORMAL HIGH (ref 70–99)
Potassium: 3.7 mmol/L (ref 3.5–5.1)
Sodium: 139 mmol/L (ref 135–145)
Total Bilirubin: 0.4 mg/dL (ref 0.3–1.2)
Total Protein: 8.2 g/dL — ABNORMAL HIGH (ref 6.5–8.1)

## 2020-06-08 MED ORDER — DIPHENHYDRAMINE HCL 25 MG PO CAPS
ORAL_CAPSULE | ORAL | Status: AC
  Filled 2020-06-08: qty 1

## 2020-06-08 MED ORDER — HEPARIN SOD (PORK) LOCK FLUSH 100 UNIT/ML IV SOLN
500.0000 [IU] | Freq: Once | INTRAVENOUS | Status: AC | PRN
  Administered 2020-06-08: 500 [IU]
  Filled 2020-06-08: qty 5

## 2020-06-08 MED ORDER — SODIUM CHLORIDE 0.9 % IV SOLN
3.4000 mg/kg | Freq: Once | INTRAVENOUS | Status: AC
  Administered 2020-06-08: 360 mg via INTRAVENOUS
  Filled 2020-06-08: qty 10

## 2020-06-08 MED ORDER — SODIUM CHLORIDE 0.9 % IV SOLN
INTRAVENOUS | Status: DC
  Filled 2020-06-08: qty 250

## 2020-06-08 MED ORDER — SODIUM CHLORIDE 0.9% FLUSH
10.0000 mL | INTRAVENOUS | Status: DC | PRN
  Administered 2020-06-08: 10 mL
  Filled 2020-06-08: qty 10

## 2020-06-08 MED ORDER — ACETAMINOPHEN 325 MG PO TABS
ORAL_TABLET | ORAL | Status: AC
  Filled 2020-06-08: qty 2

## 2020-06-08 MED ORDER — SODIUM CHLORIDE 0.9 % IV SOLN
Freq: Once | INTRAVENOUS | Status: AC
  Filled 2020-06-08: qty 250

## 2020-06-08 MED ORDER — ACETAMINOPHEN 325 MG PO TABS
650.0000 mg | ORAL_TABLET | Freq: Once | ORAL | Status: AC
  Administered 2020-06-08: 650 mg via ORAL

## 2020-06-08 MED ORDER — SODIUM CHLORIDE 0.9% FLUSH
10.0000 mL | Freq: Once | INTRAVENOUS | Status: AC
  Administered 2020-06-08: 10 mL
  Filled 2020-06-08: qty 10

## 2020-06-08 MED ORDER — DIPHENHYDRAMINE HCL 25 MG PO CAPS
25.0000 mg | ORAL_CAPSULE | Freq: Once | ORAL | Status: AC
  Administered 2020-06-08: 25 mg via ORAL

## 2020-06-08 MED ORDER — DENOSUMAB 120 MG/1.7ML ~~LOC~~ SOLN
SUBCUTANEOUS | Status: AC
  Filled 2020-06-08: qty 1.7

## 2020-06-08 MED ORDER — DENOSUMAB 120 MG/1.7ML ~~LOC~~ SOLN
120.0000 mg | Freq: Once | SUBCUTANEOUS | Status: AC
  Administered 2020-06-08: 120 mg via SUBCUTANEOUS

## 2020-06-08 NOTE — Progress Notes (Signed)
Kidney is creatinine is creeping up.  We are going to give her extra fluids with treatment today.  I am going to see her next Friday just to make sure everything is okay.  We may need to restage her but her last PET scan was very favorable.

## 2020-06-08 NOTE — Telephone Encounter (Signed)
Scheduled per 7/8 sch message. Unable to reach pt. Left voicemail - appts added on 7/16

## 2020-06-08 NOTE — Progress Notes (Signed)
ASt 100, ALT 102 today. Okay to treat per Dr. Jana Hakim.

## 2020-06-08 NOTE — Telephone Encounter (Signed)
Per MD review of noted creatinine of 1.7 today- gave ok to treat - with order for additional 500 cc NS with treatment today.  MD will see pt next week to follow up labs and possible need for treatment plan change.

## 2020-06-08 NOTE — Patient Instructions (Signed)
[facility] Discharge Instructions for Patients Receiving Chemotherapy  Today you received the following chemotherapy agents Kadcyla  To help prevent nausea and vomiting after your treatment, we encourage you to take your nausea medication as directed   If you develop nausea and vomiting that is not controlled by your nausea medication, call the clinic.   BELOW ARE SYMPTOMS THAT SHOULD BE REPORTED IMMEDIATELY:  *FEVER GREATER THAN 100.5 F  *CHILLS WITH OR WITHOUT FEVER  NAUSEA AND VOMITING THAT IS NOT CONTROLLED WITH YOUR NAUSEA MEDICATION  *UNUSUAL SHORTNESS OF BREATH  *UNUSUAL BRUISING OR BLEEDING  TENDERNESS IN MOUTH AND THROAT WITH OR WITHOUT PRESENCE OF ULCERS  *URINARY PROBLEMS  *BOWEL PROBLEMS  UNUSUAL RASH Items with * indicate a potential emergency and should be followed up as soon as possible.  Feel free to call the clinic should you have any questions or concerns. The clinic phone number is [phone].  Please show the CHEMO ALERT CARD at check-in to the Emergency Department and triage nurse.   

## 2020-06-09 LAB — FOLLICLE STIMULATING HORMONE: FSH: 77.2 m[IU]/mL

## 2020-06-13 LAB — ESTRADIOL, ULTRA SENS: Estradiol, Sensitive: <2.5 pg/mL

## 2020-06-15 NOTE — Progress Notes (Signed)
[facility]  Telephone:([phone]) [phone] Fax:([phone]) [phone]    ID: [name] [name] DOB: October 12, 1971  MR#: [id number]  WEX#:[id number]  Patient Care Team: Marda Stalker, PA-C as PCP - General (Family Medicine) Jerline Pain, MD as PCP - Cardiology (Cardiology) Analee Montee, Virgie Dad, MD as Consulting Physician (Oncology) Loreta Ave, MD as Referring Physician (Internal Medicine) Donato Heinz, MD as Consulting Physician (Nephrology) Kyung Rudd, MD as Consulting Physician (Radiation Oncology) Jovita Kussmaul, MD as Consulting Physician (General Surgery) Larey Dresser, MD as Consulting Physician (Cardiology) Mauro Kaufmann, RN as Oncology Nurse Navigator Rockwell Germany, RN as Oncology Nurse Navigator OTHER MD:   CHIEF COMPLAINT: Triple positive breast cancer (s/p right mastectomy)  CURRENT TREATMENT: T-DM1; anastrozole; Xgeva (Q 6 w)   INTERVAL HISTORY: [name] returns today for follow-up and treatment of her triple positive breast cancer. She was last seen here on 04/06/2020.   She continues on TDM-1. Today is day 9 cycle 8.   She also continues on anastrozole.  She tolerates this well, with no significant hot flashes.  She does have some vaginal dryness but she is using coconut oil to deal with that.  She also continues on Xgeva, which was last received on 06/08/2020.  Next dose will be August 19.  She has no side effects from this that she is aware of  [place]'s last echocardiogram on 03/08/2020, showed an ejection fraction in the 70% - 75% range.  She is scheduled for repeat echocardiography 07/12/2020.  Since her last visit here, she has not undergone any additional studies.     REVIEW OF SYSTEMS: [name] continues to work full-time.  She is exercising chiefly by dancing.  After getting a treatment she feels queasy for a couple of days, has some loose bowel movements.  She has had mild nosebleeds.  She denies unusual headaches visual changes cough phlegm  production pleurisy shortness of breath or change in bladder habits.  A detailed review of systems today was stable   HISTORY OF CURRENT ILLNESS: From the original intake note:  [name] [name] palpated a lump on 01/15/2018. She felt soreness and shooting pain under her arm and in the right breast. She underwent bilateral diagnostic mammography with tomography and right breast ultrasonography at [facility] on 03/12/2018 showing: breast density category C. The is architectural distortion at the 11 o'clock position. There is also an oval mass in the right breast anterior depth. Examination of the right axilla showed enlarged lymph nodes. Ultrasonography showed a 4.6 cm mass in the right breast upper outer quadrant posterior depth. An additional 1.2 cm oval mass in the right breast 12 o'clock middle depth. The lymph nodes in the right axillary are highly suggestive of malignancy.   Accordingly on 03/16/2018 she proceeded to biopsy of the right breast area in question. The pathology from this procedure showed ([id number]): At both the 11 and 12 o'clock positions: Invasive ductal carcinoma grade II. Ductal carcinoma in situ, high grade, with necrosis and calcifications. Prognostic indicators significant for: estrogen receptor, 90% positive and progesterone receptor, 40% positive, both with strong staining intensity. Proliferation marker Ki67 at 80%. HER2 amplified with ratios HER2/CEP17 SIGNALS 6.90 and average HER2 copies per cell 14.50  The patient's subsequent history is as detailed below.   PAST MEDICAL HISTORY: Past Medical History:  Diagnosis Date  . Anxiety   . Breast cancer, left breast ([place]) 2000   Underwent lumpectomy, chemotherapy and radiation  . Facial paralysis/Bells palsy    left side  . Family  history of lung cancer   . Family history of lymphoma   . Hypertension   . Neuropathy    IN FINGERS AND TOES   RECENT INFUSION OF CHEMO  . Personal history of breast cancer 05/06/2018  . Renal  disorder    just after TIA acute kidney injury  . TIA (transient ischemic attack) 03/16/2017   [facility]   The patient has a previous history of left breast cancer diagnosed in 2000. She was seen by [facility] in [place], [place] under Dr. Loreta Ave. According to the patient, her left breast cancer was stage II with no lymph node involvement (likely T2N0). She had chemotherapy every 3 weeks for about 6 months. She did not takes any "red drugs." She also did not take anti-estrogens (likely ER/PR negative).   TIA in 2018. She saw a neurologist as a precaution. She denies any clotting issues.    PAST SURGICAL HISTORY: Past Surgical History:  Procedure Laterality Date  . BREAST LUMPECTOMY WITH AXILLARY LYMPH NODE BIOPSY Left 2000   biopsy  . BREAST SURGERY     partial mastectomy with lymph node removal  . MASTECTOMY Right 2019   & partial mastectomy left breast 2000  . MASTECTOMY MODIFIED RADICAL Right 08/26/2018   Procedure: MASTECTOMY MODIFIED RADICAL;  Surgeon: Jovita Kussmaul, MD;  Location: [facility];  Service: General;  Laterality: Right;  . MODIFIED MASTECTOMY Right 08/26/2018  . PORTACATH PLACEMENT Left 04/08/2018   Procedure: INSERTION PORT-A-CATH;  Surgeon: Jovita Kussmaul, MD;  Location: [facility];  Service: General;  Laterality: Left;  . TUBAL LIGATION Bilateral 2004  . WISDOM TOOTH EXTRACTION      FAMILY HISTORY Family History  Problem Relation Age of Onset  . Hypertension Mother   . Hypertension Father   . Diverticulosis Father   . Diabetes Father   . Lung cancer Father 10       hx smoking  . Hypertension Maternal Grandmother   . CAD Maternal Grandmother   . Alzheimer's disease Maternal Grandmother        died at 35  . Hypertension Paternal Grandmother   . CAD Paternal Grandmother   . Stroke Paternal Grandfather   . Sickle cell trait Paternal Grandfather   . Pneumonia Maternal Aunt        died at a young adult  . Lymphoma Paternal Aunt 20   The  patient's father is alive at age 26 and had a history of lung cancer. The patient's mother is alive at age 66. The patient has 1 brother and no sisters. She denies a family history of breast or ovarian cancer.     GYNECOLOGIC HISTORY:  No LMP recorded. (Menstrual status: Chemotherapy). Menarche: 49 years old Age at first live birth: 49 years old She is GXP3. She is no longer having periods, which were irregular and light. Her LMP was  January 2019. She is not having hot flashes. The patient used oral contraceptive from [phone] and the Depo-provera shot from [phone] with no complications. She never used HRT.    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  She is now working at [employer] as a Occupational psychologist.  Her husband, [name], is a Administrator. The patient's son, Shea Stakes age 39, works at [employer] in [place]. The patient's daughters, Lilia Pro age 37, and Levonne Spiller age 80, are students. The patient's adopted son, Vonna Kotyk age 20, is also a Ship broker.     ADVANCED DIRECTIVES: In  the absence of any documents to the contrary the patient's husband is her healthcare power of attorney   HEALTH MAINTENANCE: Social History   Tobacco Use  . Smoking status: Never Smoker  . Smokeless tobacco: Never Used  Vaping Use  . Vaping Use: Never used  Substance Use Topics  . Alcohol use: No  . Drug use: No     Colonoscopy: n/a  PAP: 2015  Bone density: never done   Allergies  Allergen Reactions  . Ace Inhibitors Swelling    Swelling of lips and face  . Lisinopril Swelling    Swelling of lips, face  . Amlodipine Swelling    Leg swelling  . Adhesive [Tape] Itching and Rash    Please use paper tape  . Latex Itching and Rash    Current Outpatient Medications  Medication Sig Dispense Refill  . anastrozole (ARIMIDEX) 1 MG tablet Take 1 tablet (1 mg total) by mouth daily. 90 tablet 4  . aspirin EC 81 MG tablet Take 81 mg by mouth daily.    . carvedilol (COREG)  3.125 MG tablet TAKE 1 TABLET (3.125 MG TOTAL) BY MOUTH 2 (TWO) TIMES DAILY WITH A MEAL. 180 tablet 2  . Cholecalciferol (VITAMIN D3 PO) Take by mouth daily.    . Cyanocobalamin (VITAMIN B12) 500 MCG TABS Take 500 mcg by mouth daily.     . ferrous sulfate 325 (65 FE) MG tablet Take 1 tablet (325 mg total) by mouth daily. 30 tablet 0  . lidocaine-prilocaine (EMLA) cream Apply 1 application topically as needed.    Marland Kitchen losartan (COZAAR) 50 MG tablet Take 1 tablet (50 mg total) by mouth daily. 30 tablet 11  . Multiple Vitamin (MULITIVITAMIN WITH MINERALS) TABS Take 1 tablet by mouth daily.      Marland Kitchen spironolactone-hydrochlorothiazide (ALDACTAZIDE) 25-25 MG tablet TAKE 1 TABLET BY MOUTH EVERY DAY 30 tablet 2  . venlafaxine XR (EFFEXOR-XR) 37.5 MG 24 hr capsule TAKE 1 CAPSULE BY MOUTH DAILY WITH BREAKFAST. 90 capsule 2   No current facility-administered medications for this visit.    OBJECTIVE;  African-American woman in no acute distress  Vitals:   06/16/20 0924  BP: 134/70  Pulse: 69  Resp: 18  SpO2: 100%   Wt Readings from Last 3 Encounters:  06/16/20 203 lb 1.6 oz (92.1 kg)  06/08/20 200 lb (90.7 kg)  04/06/20 210 lb 6.4 oz (95.4 kg)   Body mass index is 41.02 kg/m.    ECOG FS:1 - Symptomatic but completely ambulatory  Ocular: Sclerae unicteric, pupils round and equal Ear-nose-throat: Wearing a mask Lymphatic: No cervical or supraclavicular adenopathy Lungs no rales or rhonchi Heart regular rate and rhythm Abd soft, nontender, positive bowel sounds MSK no focal spinal tenderness, no joint edema Neuro: non-focal, well-oriented, appropriate affect Breasts: She is status post right mastectomy and chest wall radiation.  There is no evidence of chest wall recurrence.  The left breast is benign.  LAB RESULTS:  CMP     Component Value Date/Time   NA 140 06/16/2020 0855   K 3.5 06/16/2020 0855   CL 108 06/16/2020 0855   CO2 23 06/16/2020 0855   GLUCOSE 131 (H) 06/16/2020 0855    BUN 33 (H) 06/16/2020 0855   CREATININE 1.45 (H) 06/16/2020 0855   CREATININE 1.36 (H) 05/19/2019 0832   CALCIUM 11.3 (H) 06/16/2020 0855   CALCIUM 11.2 (H) 02/24/2020 0830   PROT 7.4 06/16/2020 0855   ALBUMIN 2.8 (L) 06/16/2020 0855   AST 123 (H)  06/16/2020 0855   AST 19 05/19/2019 0832   ALT 79 (H) 06/16/2020 0855   ALT 24 05/19/2019 0832   ALKPHOS 109 06/16/2020 0855   BILITOT 0.4 06/16/2020 0855   BILITOT 0.3 05/19/2019 0832   GFRNONAA 42 (L) 06/16/2020 0855   GFRNONAA 46 (L) 05/19/2019 0832   GFRAA 49 (L) 06/16/2020 0855   GFRAA 54 (L) 05/19/2019 0832    Lab Results  Component Value Date   TOTALPROTELP 7.0 02/25/2019     Lab Results  Component Value Date   KPAFRELGTCHN 76.7 (H) 02/25/2019   LAMBDASER 27.2 (H) 02/25/2019   KAPLAMBRATIO 2.82 (H) 02/25/2019    Lab Results  Component Value Date   WBC 5.0 06/16/2020   NEUTROABS 2.9 06/16/2020   HGB 10.4 (L) 06/16/2020   HCT 32.1 (L) 06/16/2020   MCV 82.9 06/16/2020   PLT 54 (L) 06/16/2020   No results found for: LABCA2  No components found for: NIDPOE423  No results for input(s): INR in the last 168 hours.  No results found for: LABCA2  No results found for: NTI144  No results found for: RXV400  No results found for: QQP619  Lab Results  Component Value Date   CA2729 38.7 (H) 02/25/2019    No components found for: HGQUANT  No results found for: CEA1 / No results found for: CEA1   No results found for: AFPTUMOR  No results found for: CHROMOGRNA  No results found for: HGBA, HGBA2QUANT, HGBFQUANT, HGBSQUAN (Hemoglobinopathy evaluation)   No results found for: LDH  No results found for: IRON, TIBC, IRONPCTSAT (Iron and TIBC)  No results found for: FERRITIN  Urinalysis    Component Value Date/Time   COLORURINE YELLOW 08/18/2019 [place] 08/18/2019 1420   LABSPEC 1.016 08/18/2019 1420   PHURINE 5.0 08/18/2019 [place] 08/18/2019 [place]  08/18/2019 [place] 08/18/2019 1420   KETONESUR NEGATIVE 08/18/2019 1420   PROTEINUR NEGATIVE 08/18/2019 1420   UROBILINOGEN 0.2 11/29/2011 1656   NITRITE NEGATIVE 08/18/2019 1420   LEUKOCYTESUR SMALL (A) 08/18/2019 1420     STUDIES: No results found.  ELIGIBLE FOR AVAILABLE RESEARCH PROTOCOL: no   ASSESSMENT: 49 y.o. [place], [place] woman  (1) status post left lumpectomy in 2000 for a (?) T2N0 breast cancer,  (a) status post adjuvant chemotherapy  (b) status post adjuvant radiation  (c) did not receive antiestrogens  (2) genetics testing May 18, 2018 through the common Hereditary Cancers Panel + Myelodysplastic Syndrome/Leukemia Panel found no deleterious mutations in APC, ATM, AXIN2, BARD1, BLM, BMPR1A, BRCA1, BRCA2, BRIP1, CDH1, CDK4, CDKN2A (p14ARF), CDKN2A (p16INK4a), CEBPA, CHEK2, CTNNA1, DICER1, EPCAM*, GATA2, GREM1*, HRAS, KIT, MEN1, MLH1, MSH2, MSH3, MSH6, MUTYH, NBN, NF1, PALB2, PDGFRA, PMS2, POLD1, POLE, PTEN, RAD50, RAD51C, RAD51D, RUNX1, SDHB, SDHC, SDHD, SMAD4, SMARCA4, STK11, TERC, TERT, TP53, TSC1, TSC2, VHL The following genes were evaluated for sequence changes only: HOXB13*, NTHL1*, SDHA  (a)  3 variants of uncertain significance were identified in the genes BARD1 c.764A>G (p.Asn255Ser), BRIP1 c.2563C>T (p.Arg855Cys), and PALB2 c.3103A>T (p.Ile1035Phe).   (3) status post right breast biopsy 03/16/2018 for a clinical mT2-3 N2, anatomic stage III invasive ductal carcinoma, grade 2, triple positive, with an MIB-1 of 80%.  (a) bone scan and chest CT scan negative for metastases except for a T6 lytic lesion of concern  (b) thoracic spine MRI was ordered May 2019 but never performed.  (4) neoadjuvant chemotherapy consisting of carboplatin, docetaxel, trastuzumab and Pertuzumab every  3 weeks x 6 starting 04/16/2018  (a) pertuzumab held beginning with cycle 2 because of diarrhea  (5) trastuzumab to be continued Q4w indefinitely, discontinued after  10/21/2019 dose with progression  (a) echocardiogram on 06/29/2018 showed an ejection fraction in the 65-70% range  (b) echocardiogram 11/19/2018 showed an ejection fraction in the 60-65% range  (c) echo 04/07/2019 shows an ejection fraction in the 55-60% range  (d) echo 07/15/2019 shows EF of 60-65%  (6) right mastectomy and sentinel lymph node sampling on 08/26/2018 found a residual 2.5cm invasive ductal carcinoma, with 2 of 5 sentinel lymph nodes positive, ypT2, ypN1a; margins were clear  (7) adjuvant radiation 09/30/2018 - 11/16/2018 Site/dose:   The patient initially received a dose of 50.4 Gy in 28 fractions to the right chest wall and supraclavicular region. This was delivered using a 3-D conformal, 4 field technique. The patient then received a boost to the mastectomy scar. This delivered an additional 10 Gy in 5 fractions using an en face electron field. The total dose was 60.4   (8) anastrozole started 12/03/2018  (a) [place] on 10/01/2018 was 64.3, and estradiol 7.0, consistent with menopause  (b) referral to pelvic floor rehabilitation 12/03/2018  (c) [place] on 03/16/2020 was 80.8 with estradiol less than 2.5   METASTATIC DISEASE: March 2020 (9) CT of neck and CT angiography of the chest 02/04/2019 finds the T6 lytic lesion noted May 2019 (see #3 above) is now sclerotic; a sclerotic T3 lesion is stable; there is a new lytic lesion in the manubrium  (a) PET scan 03/12/2019 shows manubrium, T3 and T5 metastases, no visceral disease  (b) biopsy of manubrial lesion planned but not done secondary to pandemic  (c) Bone scan 09/01/19 shows ? progression of disease in spine; MRI 09/02/19 shows early metastases in T10 (new), T3 and T5 stable; CT chest 09/01/19 shows 56m pulmonary nodules that are new, but non specific and warrant future follow up  (d) PET 10/26/2019 shows no lung or liver lesions; new mediastinal adenopathy, bone lesions  (10) SRS to spine, palliative treatment to sternum, from  05/13/2019 through 05/26/2019:  1. Chest_sternum // 40 Gy in 10 fractions  2. Thoracic Spine (T3) // 18 Gy in 1 fraction   3. Thoracic Spine (T5) // 18 Gy in 1 fraction  (11) denosumab/Xgeva started 09/08/2019  (12) T-DM1 started 01/15/20201  (a) echo 10/26/2019 shows stable EF at 55-60%  (b) echo 03/08/2020 shows an ejection fraction in the 70-75% range  (c) PET scan 03/08/2020 shows no active disease; a 0.9 cm subcutaneous nodule in the left shoulder area seen on the PET was not identifiable by exam 03/16/2020   PLAN: [place]undiis now about a year and a half out from definitive diagnosis of metastatic breast cancer.  Clinically she is doing terrific with no evidence of active disease.  She is tolerating treatment well.  She is already scheduled for echocardiography next month.  She will have a restaging PET scan late September and see me the last week in September.  If everything continues well we will consider moving the T-DM1 to every 4 weeks instead of every 3 weeks.  She is continuing on anastrozole with good tolerance  She knows to call for any other issue that may develop before the next visit  Total encounter time 30 minutes.*Sarajane JewsC. Alveena Taira, MD  06/16/20 10:00 AM Medical Oncology and Hematology [facility] [id number]. [phone]   Fax.  [phone]   I, Jacqualyn Posey am acting as a Education administrator for Chauncey Cruel, MD.   I, Lurline Del MD, have reviewed the above documentation for accuracy and completeness, and I agree with the above.    *Total Encounter Time as defined by the Centers for Medicare and Medicaid Services includes, in addition to the face-to-face time of a patient visit (documented in the note above) non-face-to-face time: obtaining and reviewing outside history, ordering and reviewing medications, tests or procedures, care coordination (communications with other health care professionals or  caregivers) and documentation in the medical record.

## 2020-06-16 ENCOUNTER — Inpatient Hospital Stay (HOSPITAL_BASED_OUTPATIENT_CLINIC_OR_DEPARTMENT_OTHER): Payer: BC Managed Care – PPO | Admitting: Oncology

## 2020-06-16 ENCOUNTER — Other Ambulatory Visit: Payer: Self-pay

## 2020-06-16 ENCOUNTER — Inpatient Hospital Stay: Payer: BC Managed Care – PPO

## 2020-06-16 VITALS — BP 134/70 | HR 69 | Resp 18 | Ht 59.0 in | Wt 203.1 lb

## 2020-06-16 DIAGNOSIS — C50411 Malignant neoplasm of upper-outer quadrant of right female breast: Secondary | ICD-10-CM | POA: Diagnosis not present

## 2020-06-16 DIAGNOSIS — T451X5A Adverse effect of antineoplastic and immunosuppressive drugs, initial encounter: Secondary | ICD-10-CM | POA: Diagnosis not present

## 2020-06-16 DIAGNOSIS — C7951 Secondary malignant neoplasm of bone: Secondary | ICD-10-CM | POA: Diagnosis not present

## 2020-06-16 DIAGNOSIS — Z170 Estrogen receptor positive status [ER+]: Secondary | ICD-10-CM

## 2020-06-16 DIAGNOSIS — Z95828 Presence of other vascular implants and grafts: Secondary | ICD-10-CM

## 2020-06-16 DIAGNOSIS — R197 Diarrhea, unspecified: Secondary | ICD-10-CM | POA: Diagnosis not present

## 2020-06-16 DIAGNOSIS — Z923 Personal history of irradiation: Secondary | ICD-10-CM | POA: Diagnosis not present

## 2020-06-16 DIAGNOSIS — Z5112 Encounter for antineoplastic immunotherapy: Secondary | ICD-10-CM | POA: Diagnosis not present

## 2020-06-16 DIAGNOSIS — Z6841 Body Mass Index (BMI) 40.0 and over, adult: Secondary | ICD-10-CM

## 2020-06-16 DIAGNOSIS — E109 Type 1 diabetes mellitus without complications: Secondary | ICD-10-CM | POA: Diagnosis not present

## 2020-06-16 DIAGNOSIS — Z7189 Other specified counseling: Secondary | ICD-10-CM | POA: Diagnosis not present

## 2020-06-16 DIAGNOSIS — Z79811 Long term (current) use of aromatase inhibitors: Secondary | ICD-10-CM | POA: Diagnosis not present

## 2020-06-16 DIAGNOSIS — Z79899 Other long term (current) drug therapy: Secondary | ICD-10-CM | POA: Diagnosis not present

## 2020-06-16 DIAGNOSIS — Z9011 Acquired absence of right breast and nipple: Secondary | ICD-10-CM | POA: Diagnosis not present

## 2020-06-16 DIAGNOSIS — F419 Anxiety disorder, unspecified: Secondary | ICD-10-CM | POA: Diagnosis not present

## 2020-06-16 DIAGNOSIS — I10 Essential (primary) hypertension: Secondary | ICD-10-CM | POA: Diagnosis not present

## 2020-06-16 LAB — CBC WITH DIFFERENTIAL/PLATELET
Abs Immature Granulocytes: 0.03 10*3/uL (ref 0.00–0.07)
Basophils Absolute: 0 10*3/uL (ref 0.0–0.1)
Basophils Relative: 1 %
Eosinophils Absolute: 0.1 10*3/uL (ref 0.0–0.5)
Eosinophils Relative: 2 %
HCT: 32.1 % — ABNORMAL LOW (ref 36.0–46.0)
Hemoglobin: 10.4 g/dL — ABNORMAL LOW (ref 12.0–15.0)
Immature Granulocytes: 1 %
Lymphocytes Relative: 23 %
Lymphs Abs: 1.2 10*3/uL (ref 0.7–4.0)
MCH: 26.9 pg (ref 26.0–34.0)
MCHC: 32.4 g/dL (ref 30.0–36.0)
MCV: 82.9 fL (ref 80.0–100.0)
Monocytes Absolute: 0.8 10*3/uL (ref 0.1–1.0)
Monocytes Relative: 16 %
Neutro Abs: 2.9 10*3/uL (ref 1.7–7.7)
Neutrophils Relative %: 57 %
Platelets: 54 10*3/uL — ABNORMAL LOW (ref 150–400)
RBC: 3.87 MIL/uL (ref 3.87–5.11)
RDW: 17 % — ABNORMAL HIGH (ref 11.5–15.5)
WBC: 5 10*3/uL (ref 4.0–10.5)
nRBC: 0 % (ref 0.0–0.2)

## 2020-06-16 LAB — COMPREHENSIVE METABOLIC PANEL
ALT: 79 U/L — ABNORMAL HIGH (ref 0–44)
AST: 123 U/L — ABNORMAL HIGH (ref 15–41)
Albumin: 2.8 g/dL — ABNORMAL LOW (ref 3.5–5.0)
Alkaline Phosphatase: 109 U/L (ref 38–126)
Anion gap: 9 (ref 5–15)
BUN: 33 mg/dL — ABNORMAL HIGH (ref 6–20)
CO2: 23 mmol/L (ref 22–32)
Calcium: 11.3 mg/dL — ABNORMAL HIGH (ref 8.9–10.3)
Chloride: 108 mmol/L (ref 98–111)
Creatinine, Ser: 1.45 mg/dL — ABNORMAL HIGH (ref 0.44–1.00)
GFR calc Af Amer: 49 mL/min — ABNORMAL LOW (ref >60)
GFR calc non Af Amer: 42 mL/min — ABNORMAL LOW (ref >60)
Glucose, Bld: 131 mg/dL — ABNORMAL HIGH (ref 70–99)
Potassium: 3.5 mmol/L (ref 3.5–5.1)
Sodium: 140 mmol/L (ref 135–145)
Total Bilirubin: 0.4 mg/dL (ref 0.3–1.2)
Total Protein: 7.4 g/dL (ref 6.5–8.1)

## 2020-06-16 MED ORDER — SODIUM CHLORIDE 0.9% FLUSH
10.0000 mL | Freq: Once | INTRAVENOUS | Status: AC
  Administered 2020-06-16: 10 mL
  Filled 2020-06-16: qty 10

## 2020-06-16 MED ORDER — ANASTROZOLE 1 MG PO TABS: 1.0000 mg | ORAL_TABLET | Freq: Every day | ORAL | 4 refills | Status: DC

## 2020-06-16 MED ORDER — HEPARIN SOD (PORK) LOCK FLUSH 100 UNIT/ML IV SOLN
250.0000 [IU] | Freq: Once | INTRAVENOUS | Status: AC
  Administered 2020-06-16: 250 [IU]
  Filled 2020-06-16: qty 5

## 2020-06-16 NOTE — Patient Instructions (Signed)

## 2020-06-16 NOTE — Addendum Note (Signed)
Addended by: Arty Baumgartner on: 06/16/2020 10:34 AM   Modules accepted: Orders

## 2020-06-19 ENCOUNTER — Telehealth: Payer: Self-pay | Admitting: Oncology

## 2020-06-19 NOTE — Telephone Encounter (Signed)
Scheduled per 7/16 los, added new appts, will give pt a calendar printout

## 2020-06-29 ENCOUNTER — Other Ambulatory Visit: Payer: BC Managed Care – PPO

## 2020-06-29 ENCOUNTER — Inpatient Hospital Stay: Payer: BC Managed Care – PPO

## 2020-06-29 ENCOUNTER — Other Ambulatory Visit: Payer: Self-pay

## 2020-06-29 ENCOUNTER — Ambulatory Visit: Payer: BC Managed Care – PPO

## 2020-06-29 ENCOUNTER — Other Ambulatory Visit: Payer: Self-pay | Admitting: Medical

## 2020-06-29 VITALS — BP 119/85 | HR 70 | Temp 98.5°F | Resp 18 | Wt 199.5 lb

## 2020-06-29 DIAGNOSIS — F419 Anxiety disorder, unspecified: Secondary | ICD-10-CM | POA: Diagnosis not present

## 2020-06-29 DIAGNOSIS — E109 Type 1 diabetes mellitus without complications: Secondary | ICD-10-CM | POA: Diagnosis not present

## 2020-06-29 DIAGNOSIS — Z5112 Encounter for antineoplastic immunotherapy: Secondary | ICD-10-CM | POA: Diagnosis not present

## 2020-06-29 DIAGNOSIS — Z170 Estrogen receptor positive status [ER+]: Secondary | ICD-10-CM

## 2020-06-29 DIAGNOSIS — Z9011 Acquired absence of right breast and nipple: Secondary | ICD-10-CM | POA: Diagnosis not present

## 2020-06-29 DIAGNOSIS — Z79899 Other long term (current) drug therapy: Secondary | ICD-10-CM | POA: Diagnosis not present

## 2020-06-29 DIAGNOSIS — C50411 Malignant neoplasm of upper-outer quadrant of right female breast: Secondary | ICD-10-CM

## 2020-06-29 DIAGNOSIS — Z923 Personal history of irradiation: Secondary | ICD-10-CM | POA: Diagnosis not present

## 2020-06-29 DIAGNOSIS — Z79811 Long term (current) use of aromatase inhibitors: Secondary | ICD-10-CM | POA: Diagnosis not present

## 2020-06-29 DIAGNOSIS — I10 Essential (primary) hypertension: Secondary | ICD-10-CM | POA: Diagnosis not present

## 2020-06-29 DIAGNOSIS — C7951 Secondary malignant neoplasm of bone: Secondary | ICD-10-CM

## 2020-06-29 DIAGNOSIS — T451X5A Adverse effect of antineoplastic and immunosuppressive drugs, initial encounter: Secondary | ICD-10-CM | POA: Diagnosis not present

## 2020-06-29 DIAGNOSIS — Z95828 Presence of other vascular implants and grafts: Secondary | ICD-10-CM

## 2020-06-29 DIAGNOSIS — R197 Diarrhea, unspecified: Secondary | ICD-10-CM | POA: Diagnosis not present

## 2020-06-29 LAB — CBC WITH DIFFERENTIAL/PLATELET
Abs Immature Granulocytes: 0.01 10*3/uL (ref 0.00–0.07)
Basophils Absolute: 0 10*3/uL (ref 0.0–0.1)
Basophils Relative: 1 %
Eosinophils Absolute: 0.1 10*3/uL (ref 0.0–0.5)
Eosinophils Relative: 2 %
HCT: 33.6 % — ABNORMAL LOW (ref 36.0–46.0)
Hemoglobin: 11 g/dL — ABNORMAL LOW (ref 12.0–15.0)
Immature Granulocytes: 0 %
Lymphocytes Relative: 23 %
Lymphs Abs: 1.1 10*3/uL (ref 0.7–4.0)
MCH: 27.4 pg (ref 26.0–34.0)
MCHC: 32.7 g/dL (ref 30.0–36.0)
MCV: 83.6 fL (ref 80.0–100.0)
Monocytes Absolute: 0.6 10*3/uL (ref 0.1–1.0)
Monocytes Relative: 13 %
Neutro Abs: 2.8 10*3/uL (ref 1.7–7.7)
Neutrophils Relative %: 61 %
Platelets: 104 10*3/uL — ABNORMAL LOW (ref 150–400)
RBC: 4.02 MIL/uL (ref 3.87–5.11)
RDW: 17.4 % — ABNORMAL HIGH (ref 11.5–15.5)
WBC: 4.7 10*3/uL (ref 4.0–10.5)
nRBC: 0 % (ref 0.0–0.2)

## 2020-06-29 LAB — COMPREHENSIVE METABOLIC PANEL
ALT: 80 U/L — ABNORMAL HIGH (ref 0–44)
AST: 130 U/L — ABNORMAL HIGH (ref 15–41)
Albumin: 3 g/dL — ABNORMAL LOW (ref 3.5–5.0)
Alkaline Phosphatase: 128 U/L — ABNORMAL HIGH (ref 38–126)
Anion gap: 9 (ref 5–15)
BUN: 29 mg/dL — ABNORMAL HIGH (ref 6–20)
CO2: 22 mmol/L (ref 22–32)
Calcium: 11.9 mg/dL — ABNORMAL HIGH (ref 8.9–10.3)
Chloride: 106 mmol/L (ref 98–111)
Creatinine, Ser: 1.37 mg/dL — ABNORMAL HIGH (ref 0.44–1.00)
GFR calc Af Amer: 53 mL/min — ABNORMAL LOW (ref >60)
GFR calc non Af Amer: 45 mL/min — ABNORMAL LOW (ref >60)
Glucose, Bld: 105 mg/dL — ABNORMAL HIGH (ref 70–99)
Potassium: 4.1 mmol/L (ref 3.5–5.1)
Sodium: 137 mmol/L (ref 135–145)
Total Bilirubin: 0.5 mg/dL (ref 0.3–1.2)
Total Protein: 8.1 g/dL (ref 6.5–8.1)

## 2020-06-29 MED ORDER — SODIUM CHLORIDE 0.9% FLUSH
10.0000 mL | INTRAVENOUS | Status: DC | PRN
  Administered 2020-06-29: 10 mL
  Filled 2020-06-29: qty 10

## 2020-06-29 MED ORDER — ACETAMINOPHEN 325 MG PO TABS
650.0000 mg | ORAL_TABLET | Freq: Once | ORAL | Status: AC
  Administered 2020-06-29: 650 mg via ORAL

## 2020-06-29 MED ORDER — SODIUM CHLORIDE 0.9 % IV SOLN
Freq: Once | INTRAVENOUS | Status: AC
  Filled 2020-06-29: qty 250

## 2020-06-29 MED ORDER — SODIUM CHLORIDE 0.9% FLUSH
10.0000 mL | Freq: Once | INTRAVENOUS | Status: AC
  Administered 2020-06-29: 10 mL
  Filled 2020-06-29: qty 10

## 2020-06-29 MED ORDER — SODIUM CHLORIDE 0.9 % IV SOLN
3.4000 mg/kg | Freq: Once | INTRAVENOUS | Status: AC
  Administered 2020-06-29: 360 mg via INTRAVENOUS
  Filled 2020-06-29: qty 10

## 2020-06-29 MED ORDER — ACETAMINOPHEN 325 MG PO TABS
ORAL_TABLET | ORAL | Status: AC
  Filled 2020-06-29: qty 2

## 2020-06-29 MED ORDER — DIPHENHYDRAMINE HCL 25 MG PO CAPS
25.0000 mg | ORAL_CAPSULE | Freq: Once | ORAL | Status: AC
  Administered 2020-06-29: 25 mg via ORAL

## 2020-06-29 MED ORDER — DIPHENHYDRAMINE HCL 25 MG PO CAPS
ORAL_CAPSULE | ORAL | Status: AC
  Filled 2020-06-29: qty 1

## 2020-06-29 MED ORDER — HEPARIN SOD (PORK) LOCK FLUSH 100 UNIT/ML IV SOLN
500.0000 [IU] | Freq: Once | INTRAVENOUS | Status: AC | PRN
  Administered 2020-06-29: 500 [IU]
  Filled 2020-06-29: qty 5

## 2020-06-29 NOTE — Progress Notes (Signed)
OK to treat with today's AST and ALT results per [name].

## 2020-06-29 NOTE — Patient Instructions (Signed)

## 2020-06-29 NOTE — Patient Instructions (Signed)
[facility] Discharge Instructions for Patients Receiving Chemotherapy  Today you received the following chemotherapy agents :  Kadcyla.  To help prevent nausea and vomiting after your treatment, we encourage you to take your nausea medication as prescribed.   If you develop nausea and vomiting that is not controlled by your nausea medication, call the clinic.   BELOW ARE SYMPTOMS THAT SHOULD BE REPORTED IMMEDIATELY:  *FEVER GREATER THAN 100.5 F  *CHILLS WITH OR WITHOUT FEVER  NAUSEA AND VOMITING THAT IS NOT CONTROLLED WITH YOUR NAUSEA MEDICATION  *UNUSUAL SHORTNESS OF BREATH  *UNUSUAL BRUISING OR BLEEDING  TENDERNESS IN MOUTH AND THROAT WITH OR WITHOUT PRESENCE OF ULCERS  *URINARY PROBLEMS  *BOWEL PROBLEMS  UNUSUAL RASH Items with * indicate a potential emergency and should be followed up as soon as possible.  Feel free to call the clinic should you have any questions or concerns. The clinic phone number is [phone].  Please show the CHEMO ALERT CARD at check-in to the Emergency Department and triage nurse.   

## 2020-07-03 ENCOUNTER — Other Ambulatory Visit: Payer: Self-pay | Admitting: *Deleted

## 2020-07-03 DIAGNOSIS — C50411 Malignant neoplasm of upper-outer quadrant of right female breast: Secondary | ICD-10-CM

## 2020-07-03 DIAGNOSIS — Z170 Estrogen receptor positive status [ER+]: Secondary | ICD-10-CM

## 2020-07-03 MED ORDER — PROCHLORPERAZINE MALEATE 10 MG PO TABS: 10.0000 mg | ORAL_TABLET | Freq: Four times a day (QID) | ORAL | 1 refills | Status: DC | PRN

## 2020-07-03 NOTE — Telephone Encounter (Signed)
Pt called with c/o nausea and vomiting. Per Dr. Jana Hakim, sent in order for compazine to pt preferred pharmacy. Called and notified pt. Pt verbalized understanding

## 2020-07-12 ENCOUNTER — Ambulatory Visit (HOSPITAL_COMMUNITY)
Admission: RE | Admit: 2020-07-12 | Discharge: 2020-07-12 | Disposition: A | Discharge status: Home / Self Care | Payer: BC Managed Care – PPO | Source: Ambulatory Visit | Attending: Internal Medicine | Admitting: Internal Medicine

## 2020-07-12 ENCOUNTER — Ambulatory Visit (HOSPITAL_BASED_OUTPATIENT_CLINIC_OR_DEPARTMENT_OTHER)
Admission: RE | Admit: 2020-07-12 | Discharge: 2020-07-12 | Disposition: A | Discharge status: Home / Self Care | Payer: BC Managed Care – PPO | Source: Ambulatory Visit | Attending: Internal Medicine | Admitting: Internal Medicine

## 2020-07-12 ENCOUNTER — Ambulatory Visit (HOSPITAL_COMMUNITY): Payer: BC Managed Care – PPO

## 2020-07-12 ENCOUNTER — Other Ambulatory Visit: Payer: Self-pay

## 2020-07-12 DIAGNOSIS — Z170 Estrogen receptor positive status [ER+]: Secondary | ICD-10-CM | POA: Diagnosis not present

## 2020-07-12 DIAGNOSIS — Z5181 Encounter for therapeutic drug level monitoring: Secondary | ICD-10-CM | POA: Diagnosis not present

## 2020-07-12 DIAGNOSIS — I119 Hypertensive heart disease without heart failure: Secondary | ICD-10-CM | POA: Insufficient documentation

## 2020-07-12 DIAGNOSIS — I10 Essential (primary) hypertension: Secondary | ICD-10-CM

## 2020-07-12 DIAGNOSIS — C50411 Malignant neoplasm of upper-outer quadrant of right female breast: Secondary | ICD-10-CM

## 2020-07-12 LAB — ECHOCARDIOGRAM COMPLETE
Area-P 1/2: 3.06 cm2
S' Lateral: 2.8 cm

## 2020-07-12 NOTE — Progress Notes (Signed)
Cardio-Oncology Clinic TeleHealth Note  Due to national recommendations of social distancing due to [place] 19, Audio/video telehealth visit is felt to be most appropriate for this patient at this time.  See MyChart message from today for patient consent regarding telehealth for [facility]. The patient was identified personally using two identifiers.   Date:  07/12/2020   ID:  [name] [name], DOB 22-Aug-1971, MRN [id number]  Location: Home  Provider location: [facility] Type of Visit: Established patient  PCP:  [name] Stalker, PA-C  Cardiologist:  [name] Furbish, MD Primary HF: [name] [name]  Chief Complaint: Cardio-oncology  follow-up   History of Present Illness:  HPI: [name] is51 y.o.female with h/o HTN, TIA, anxiety and left breast cancer (treated in 2000 at [facility]) who was diagnosed with triple-positive R breast cancer in 4/19. Referred by Dr. Jana [name] for enrollment into the Cardio-Oncology program.  Cancer history/treatment plan:  (1) status post left lumpectomy in 2000 for a (?) T2N0 breast cancer, (a) status post adjuvant chemotherapy including adriamycin (b) status post adjuvant radiation (c) did not receive antiestrogens  (2) status post right breast biopsy 03/16/2018 for a clinical mT2-3 N2, anatomic stage III invasive ductal carcinoma, grade 2, triple positive, with an MIB-1 of 80%. (a) bone scan and chest CT scan negative for metastases except for a thoracic spine lytic lesion of concern  (3) neoadjuvant chemotherapy consisting of carboplatin, docetaxel, trastuzumab and Pertuzumab every 3 weeks x 6 starting 04/16/2018 (a) pertuzumab held beginning with cycle 2 because of diarrhea  (4) trastuzumab to be continuedindefinitely  (5)9/25 s/p surgical resection  (6) adjuvant radiation to the right breastcompleted  (7) antiestrogens to start at the completion of  local treatment  She presents via audio/video conferencing for a telehealth visit today. . Now on Kadcyla as of January 2021. Getting infusion q 3weeks.   Started having n/v with K  BP 130/70s  Echo today 07/12/20: EF 65-70% GLS -23.7% Personally reviewed   Echo 03/08/20 EF 65-70% Personally reviewed Echo 11/20 EF 60-65% GLS -18.7% Personally reviewed Echo 04/07/19 EF 60-65% GLS -17.7%  Echo 12/19 EF 60% GLS -23% Echo 06/29/18 EF 65-70% GLS -20.6% LS 9.5 cm/s  Echo 4/19 EF 60-65% mild LVH Grade II DD   [name] [name] denies symptoms worrisome for COVID 19.   Past Medical History:  Diagnosis Date  . Anxiety   . Breast cancer, left breast ([place]) 2000   Underwent lumpectomy, chemotherapy and radiation  . Facial paralysis/Bells palsy    left side  . Family history of lung cancer   . Family history of lymphoma   . Hypertension   . Neuropathy    IN FINGERS AND TOES   RECENT INFUSION OF CHEMO  . Personal history of breast cancer 05/06/2018  . Renal disorder    just after TIA acute kidney injury  . TIA (transient ischemic attack) 03/16/2017   [facility]   Past Surgical History:  Procedure Laterality Date  . BREAST LUMPECTOMY WITH AXILLARY LYMPH NODE BIOPSY Left 2000   biopsy  . BREAST SURGERY     partial mastectomy with lymph node removal  . MASTECTOMY Right 2019   & partial mastectomy left breast 2000  . MASTECTOMY MODIFIED RADICAL Right 08/26/2018   Procedure: MASTECTOMY MODIFIED RADICAL;  Surgeon: [name] Kussmaul, MD;  Location: [facility];  Service: General;  Laterality: Right;  . MODIFIED MASTECTOMY Right 08/26/2018  . PORTACATH PLACEMENT Left 04/08/2018   Procedure: INSERTION PORT-A-CATH;  Surgeon: [name] [name],  [name] Hitch, MD;  Location: [facility];  Service: General;  Laterality: Left;  . TUBAL LIGATION Bilateral 2004  . WISDOM TOOTH EXTRACTION       Current Outpatient Medications  Medication Sig Dispense Refill  . anastrozole (ARIMIDEX) 1 MG tablet Take 1 tablet (1 mg total) by  mouth daily. 90 tablet 4  . aspirin EC 81 MG tablet Take 81 mg by mouth daily.    . carvedilol (COREG) 3.125 MG tablet TAKE 1 TABLET (3.125 MG TOTAL) BY MOUTH 2 (TWO) TIMES DAILY WITH A MEAL. 180 tablet 2  . Cholecalciferol (VITAMIN D3 PO) Take by mouth daily.    . Cyanocobalamin (VITAMIN B12) 500 MCG TABS Take 500 mcg by mouth daily.     . ferrous sulfate 325 (65 FE) MG tablet Take 1 tablet (325 mg total) by mouth daily. 30 tablet 0  . lidocaine-prilocaine (EMLA) cream Apply 1 application topically as needed.    Marland Kitchen losartan (COZAAR) 50 MG tablet Take 1 tablet (50 mg total) by mouth daily. 30 tablet 11  . Multiple Vitamin (MULITIVITAMIN WITH MINERALS) TABS Take 1 tablet by mouth daily.      . prochlorperazine (COMPAZINE) 10 MG tablet Take 1 tablet (10 mg total) by mouth every 6 (six) hours as needed (Nausea or vomiting). 60 tablet 1  . spironolactone-hydrochlorothiazide (ALDACTAZIDE) 25-25 MG tablet TAKE 1 TABLET BY MOUTH EVERY DAY 30 tablet 2  . venlafaxine XR (EFFEXOR-XR) 37.5 MG 24 hr capsule TAKE 1 CAPSULE BY MOUTH DAILY WITH BREAKFAST. 90 capsule 2   No current facility-administered medications for this encounter.    Allergies:   Ace inhibitors, Lisinopril, Amlodipine, Adhesive [tape], and Latex   Social History:  The patient  reports that she has never smoked. She has never used smokeless tobacco. She reports that she does not drink alcohol and does not use drugs.   Family History:  The patient's family history includes Alzheimer's disease in her maternal grandmother; CAD in her maternal grandmother and paternal grandmother; Diabetes in her father; Diverticulosis in her father; Hypertension in her father, maternal grandmother, mother, and paternal grandmother; Lung cancer (age of onset: 52) in her father; Lymphoma (age of onset: 27) in her paternal aunt; Pneumonia in her maternal aunt; Sickle cell trait in her paternal grandfather; Stroke in her paternal grandfather.   ROS:  Please see the  history of present illness.   All other systems are personally reviewed and negative.   Exam:  (Video/Tele Health Call; Exam is subjective and or/visual.) General:  Speaks in full sentences. No resp difficulty. Lungs: Normal respiratory effort with conversation.  Abdomen: Non-distended per patient report Extremities: Pt denies edema. Neuro: Alert & oriented x 3.   Recent Labs: 06/29/2020: ALT 80; BUN 29; Creatinine, Ser 1.37; Hemoglobin 11.0; Platelets 104; Potassium 4.1; Sodium 137  Personally reviewed   Wt Readings from Last 3 Encounters:  06/29/20 90.5 kg (199 lb 8 oz)  06/16/20 92.1 kg (203 lb 1.6 oz)  06/08/20 90.7 kg (200 lb)      ASSESSMENT AND PLAN:  1. Right Breast Cancer diagnosed 4/19 stage IV - triple positive. Found to have Stage IV. Disease in 4/20. - switched to [place] on 1/21 - I reviewed echos personally. EF and Doppler parameters stable. No HF on exam. Continue Kadcyla   2. HTN -Blood pressure well controlled. Continue current regimen.   COVID screen The patient does not have any symptoms that suggest any further testing/ screening at this time.  Social distancing reinforced today.  Recommended follow-up:  As above  Relevant cardiac medications were reviewed at length with the patient today.   The patient does not have concerns regarding their medications at this time.   The following changes were made today:  As above  Today, I have spent 14 minutes with the patient with telehealth technology discussing the above issues .    Signed, Glori Bickers, MD  07/12/2020 2:27 PM  [facility] [place] [address] [facility] [place] [address] [phone] (office) [phone] (fax)

## 2020-07-12 NOTE — Progress Notes (Signed)
  Echocardiogram 2D Echocardiogram has been performed.  Jennette Dubin 07/12/2020, 8:58 AM

## 2020-07-13 DIAGNOSIS — R509 Fever, unspecified: Secondary | ICD-10-CM | POA: Diagnosis not present

## 2020-07-13 DIAGNOSIS — R519 Headache, unspecified: Secondary | ICD-10-CM | POA: Diagnosis not present

## 2020-07-13 DIAGNOSIS — R6883 Chills (without fever): Secondary | ICD-10-CM | POA: Diagnosis not present

## 2020-07-13 DIAGNOSIS — R0981 Nasal congestion: Secondary | ICD-10-CM | POA: Diagnosis not present

## 2020-07-13 DIAGNOSIS — R432 Parageusia: Secondary | ICD-10-CM | POA: Diagnosis not present

## 2020-07-13 DIAGNOSIS — R430 Anosmia: Secondary | ICD-10-CM | POA: Diagnosis not present

## 2020-07-13 DIAGNOSIS — Z20822 Contact with and (suspected) exposure to covid-19: Secondary | ICD-10-CM | POA: Diagnosis not present

## 2020-07-13 DIAGNOSIS — R438 Other disturbances of smell and taste: Secondary | ICD-10-CM | POA: Diagnosis not present

## 2020-07-13 DIAGNOSIS — R5383 Other fatigue: Secondary | ICD-10-CM | POA: Diagnosis not present

## 2020-07-14 ENCOUNTER — Other Ambulatory Visit: Payer: Self-pay | Admitting: *Deleted

## 2020-07-14 ENCOUNTER — Telehealth: Payer: Self-pay | Admitting: *Deleted

## 2020-07-14 MED ORDER — ACYCLOVIR 400 MG PO TABS
400.0000 mg | ORAL_TABLET | Freq: Two times a day (BID) | ORAL | 1 refills | Status: DC
Start: 2020-07-14 — End: 2020-08-08

## 2020-07-14 MED ORDER — MAGIC MOUTHWASH W/LIDOCAINE
5.0000 mL | Freq: Four times a day (QID) | ORAL | 3 refills | Status: DC | PRN
Start: 2020-07-14 — End: 2020-07-31

## 2020-07-14 NOTE — Telephone Encounter (Signed)
This RN spoke with pt per her call stating she has developed 3 mouth ulcers unrelieved with OTC products.  She states she has 2 ulcers on her palette and on on the lower gum. " they look like they have pus in them and one of them has busted "  She denies any throat soreness or coating on her tongue.  She states history of fever blisters when undergoing initial chemotherapy.  Of note pt states she has been exposed to Covid per work - with 5 people in her work area now positive. She has been vaccinated and did a rapid test which is negative and is waiting on the PCR reading.  She is asymptomatic except " can't smell or taste anything ".  Reviewed with [facility]/NP and obtained orders for acyclovir and MMW.  Sent to pharmacy.  [place] understands to let this office know if she test positive per the PCR.

## 2020-07-17 ENCOUNTER — Telehealth: Payer: Self-pay

## 2020-07-17 NOTE — Telephone Encounter (Signed)
Pt called with concern about whether or not she can receive treatment as she is experiencing fever, chills, loss of taste/smell. Pt states she has been COVID tested and received negative results. Offered pt an appt with sx mgmt, but she states she can wait until 8/19 appt, Advised pt to go to ED if sx worsen. Pt verbalized understanding and agreement.

## 2020-07-20 ENCOUNTER — Inpatient Hospital Stay (HOSPITAL_BASED_OUTPATIENT_CLINIC_OR_DEPARTMENT_OTHER): Payer: BC Managed Care – PPO | Admitting: Adult Health

## 2020-07-20 ENCOUNTER — Inpatient Hospital Stay: Payer: BC Managed Care – PPO

## 2020-07-20 ENCOUNTER — Encounter: Payer: Self-pay | Admitting: Adult Health

## 2020-07-20 ENCOUNTER — Inpatient Hospital Stay: Payer: BC Managed Care – PPO | Attending: Oncology

## 2020-07-20 ENCOUNTER — Other Ambulatory Visit: Payer: Self-pay

## 2020-07-20 VITALS — BP 118/66 | HR 75 | Temp 98.2°F | Resp 18 | Ht 59.0 in | Wt 198.8 lb

## 2020-07-20 VITALS — BP 125/72 | HR 70 | Temp 98.6°F | Resp 18

## 2020-07-20 DIAGNOSIS — C50411 Malignant neoplasm of upper-outer quadrant of right female breast: Secondary | ICD-10-CM

## 2020-07-20 DIAGNOSIS — Z95828 Presence of other vascular implants and grafts: Secondary | ICD-10-CM

## 2020-07-20 DIAGNOSIS — Z170 Estrogen receptor positive status [ER+]: Secondary | ICD-10-CM

## 2020-07-20 DIAGNOSIS — Z5112 Encounter for antineoplastic immunotherapy: Secondary | ICD-10-CM | POA: Insufficient documentation

## 2020-07-20 DIAGNOSIS — C7951 Secondary malignant neoplasm of bone: Secondary | ICD-10-CM | POA: Insufficient documentation

## 2020-07-20 DIAGNOSIS — R1114 Bilious vomiting: Secondary | ICD-10-CM | POA: Diagnosis not present

## 2020-07-20 DIAGNOSIS — Z79899 Other long term (current) drug therapy: Secondary | ICD-10-CM | POA: Diagnosis not present

## 2020-07-20 LAB — COMPREHENSIVE METABOLIC PANEL
ALT: 60 U/L — ABNORMAL HIGH (ref 0–44)
AST: 97 U/L — ABNORMAL HIGH (ref 15–41)
Albumin: 3 g/dL — ABNORMAL LOW (ref 3.5–5.0)
Alkaline Phosphatase: 101 U/L (ref 38–126)
Anion gap: 7 (ref 5–15)
BUN: 27 mg/dL — ABNORMAL HIGH (ref 6–20)
CO2: 22 mmol/L (ref 22–32)
Calcium: 11.1 mg/dL — ABNORMAL HIGH (ref 8.9–10.3)
Chloride: 108 mmol/L (ref 98–111)
Creatinine, Ser: 1.49 mg/dL — ABNORMAL HIGH (ref 0.44–1.00)
GFR calc Af Amer: 48 mL/min — ABNORMAL LOW (ref >60)
GFR calc non Af Amer: 41 mL/min — ABNORMAL LOW (ref >60)
Glucose, Bld: 134 mg/dL — ABNORMAL HIGH (ref 70–99)
Potassium: 3.7 mmol/L (ref 3.5–5.1)
Sodium: 137 mmol/L (ref 135–145)
Total Bilirubin: 0.8 mg/dL (ref 0.3–1.2)
Total Protein: 7.5 g/dL (ref 6.5–8.1)

## 2020-07-20 LAB — CBC WITH DIFFERENTIAL/PLATELET
Abs Immature Granulocytes: 0.01 10*3/uL (ref 0.00–0.07)
Basophils Absolute: 0 10*3/uL (ref 0.0–0.1)
Basophils Relative: 1 %
Eosinophils Absolute: 0.1 10*3/uL (ref 0.0–0.5)
Eosinophils Relative: 1 %
HCT: 30.5 % — ABNORMAL LOW (ref 36.0–46.0)
Hemoglobin: 9.8 g/dL — ABNORMAL LOW (ref 12.0–15.0)
Immature Granulocytes: 0 %
Lymphocytes Relative: 23 %
Lymphs Abs: 1 10*3/uL (ref 0.7–4.0)
MCH: 27 pg (ref 26.0–34.0)
MCHC: 32.1 g/dL (ref 30.0–36.0)
MCV: 84 fL (ref 80.0–100.0)
Monocytes Absolute: 0.7 10*3/uL (ref 0.1–1.0)
Monocytes Relative: 15 %
Neutro Abs: 2.7 10*3/uL (ref 1.7–7.7)
Neutrophils Relative %: 60 %
Platelets: 79 10*3/uL — ABNORMAL LOW (ref 150–400)
RBC: 3.63 MIL/uL — ABNORMAL LOW (ref 3.87–5.11)
RDW: 18 % — ABNORMAL HIGH (ref 11.5–15.5)
WBC: 4.5 10*3/uL (ref 4.0–10.5)
nRBC: 0 % (ref 0.0–0.2)

## 2020-07-20 MED ORDER — SODIUM CHLORIDE 0.9 % IV SOLN
Freq: Once | INTRAVENOUS | Status: AC
  Filled 2020-07-20: qty 250

## 2020-07-20 MED ORDER — VENLAFAXINE HCL ER 37.5 MG PO CP24: 37.5000 mg | ORAL_CAPSULE | Freq: Every day | ORAL | 3 refills | Status: DC

## 2020-07-20 MED ORDER — DENOSUMAB 120 MG/1.7ML ~~LOC~~ SOLN
120.0000 mg | Freq: Once | SUBCUTANEOUS | Status: AC
  Administered 2020-07-20: 120 mg via SUBCUTANEOUS

## 2020-07-20 MED ORDER — SODIUM CHLORIDE 0.9 % IV SOLN
3.4000 mg/kg | Freq: Once | INTRAVENOUS | Status: AC
  Administered 2020-07-20: 300 mg via INTRAVENOUS
  Filled 2020-07-20: qty 15

## 2020-07-20 MED ORDER — DIPHENHYDRAMINE HCL 25 MG PO CAPS
ORAL_CAPSULE | ORAL | Status: AC
  Filled 2020-07-20: qty 1

## 2020-07-20 MED ORDER — ACETAMINOPHEN 325 MG PO TABS
ORAL_TABLET | ORAL | Status: AC
  Filled 2020-07-20: qty 2

## 2020-07-20 MED ORDER — DENOSUMAB 120 MG/1.7ML ~~LOC~~ SOLN: 120.0000 mg | Freq: Once | SUBCUTANEOUS | Status: DC

## 2020-07-20 MED ORDER — DIPHENHYDRAMINE HCL 25 MG PO CAPS
25.0000 mg | ORAL_CAPSULE | Freq: Once | ORAL | Status: AC
  Administered 2020-07-20: 25 mg via ORAL

## 2020-07-20 MED ORDER — ADO-TRASTUZUMAB EMTANSINE CHEMO INJECTION 160 MG
3.6000 mg/kg | Freq: Once | INTRAVENOUS | Status: DC
Start: 2020-07-20 — End: 2020-07-20

## 2020-07-20 MED ORDER — DENOSUMAB 120 MG/1.7ML ~~LOC~~ SOLN
SUBCUTANEOUS | Status: AC
  Filled 2020-07-20: qty 1.7

## 2020-07-20 MED ORDER — SODIUM CHLORIDE 0.9% FLUSH
10.0000 mL | INTRAVENOUS | Status: DC | PRN
  Administered 2020-07-20: 10 mL
  Filled 2020-07-20: qty 10

## 2020-07-20 MED ORDER — ACETAMINOPHEN 325 MG PO TABS
650.0000 mg | ORAL_TABLET | Freq: Once | ORAL | Status: AC
  Administered 2020-07-20: 650 mg via ORAL

## 2020-07-20 MED ORDER — HEPARIN SOD (PORK) LOCK FLUSH 100 UNIT/ML IV SOLN
500.0000 [IU] | Freq: Once | INTRAVENOUS | Status: AC | PRN
  Administered 2020-07-20: 500 [IU]
  Filled 2020-07-20: qty 5

## 2020-07-20 MED ORDER — SODIUM CHLORIDE 0.9% FLUSH
10.0000 mL | Freq: Once | INTRAVENOUS | Status: AC
  Administered 2020-07-20: 10 mL
  Filled 2020-07-20: qty 10

## 2020-07-20 NOTE — Progress Notes (Signed)
Per Wilber Bihari, NP okay to treat with PLT 79 and AST 97.

## 2020-07-20 NOTE — Progress Notes (Signed)
MD approved Kadcyla dose change due to decr wt.  Pt will receive 300 mg today.  Kennith Center, Pharm.D., CPP 07/20/2020@10 :42 AM

## 2020-07-20 NOTE — Progress Notes (Signed)
[facility]  Telephone:([phone] [phone]    ID: [name] [name] DOB: February 18, 1971  MR#: [id number]  OIZ#:[id number]  Patient Care Team: Marda Stalker, PA-C as PCP - General (Family Medicine) Jerline Pain, MD as PCP - Cardiology (Cardiology) Magrinat, Virgie Dad, MD as Consulting Physician (Oncology) Loreta Ave, MD as Referring Physician (Internal Medicine) Donato Heinz, MD as Consulting Physician (Nephrology) Kyung Rudd, MD as Consulting Physician (Radiation Oncology) Jovita Kussmaul, MD as Consulting Physician (General Surgery) Larey Dresser, MD as Consulting Physician (Cardiology) Mauro Kaufmann, RN as Oncology Nurse Navigator Rockwell Germany, RN as Oncology Nurse Navigator OTHER MD:   CHIEF COMPLAINT: Triple positive breast cancer (s/p right mastectomy)  CURRENT TREATMENT: T-DM1; anastrozole; Xgeva (Q 6 w)   INTERVAL HISTORY: [name] returns today for follow-up and treatment of her triple positive breast cancer.   She continues on TDM-1 given every three weeks.  She is tolerating this well with the exception of her intermittent thrombocytopenia.  She also continues on anastrozole.  She tolerates this well, with no significant hot flashes.  She does have some vaginal dryness but she is using coconut oil to deal with that.  She also continues on Xgeva, which was last received on 06/08/2020.  Next dose is due today.  She is tolerating this well.  Emalynn's last echocardiogram was completed on 07/12/2020 and showed an EF of 65-70%.   REVIEW OF SYSTEMS: [name] is doing well today.  She continues to work on weight loss, and is down 32 pounds form her January 2021 weight.  I congratulated her on this success.  She has some nausea, vomiting and right upper quadrant pain, worse after eating a full meal.  It has become problematic for her.  She is taking Compazine quite frequently to help with this.   [name] notes nose bleeds that happen about  every other day.  She is unsure what to do about these.  She has some blurred vision, and uses eye glasses and wants to know if she should go and see her eye doctor.  She has some fatigue and achiness that is generalized.  She also notes that she occasionally gets muscle spasms and for this she drinks orange gatorade.  Otherwise, [name] is doing well and a detailed ROS was otherwise non contributory.     HISTORY OF CURRENT ILLNESS: From the original intake note:  [name] [name] palpated a lump on 01/15/2018. She felt soreness and shooting pain under her arm and in the right breast. She underwent bilateral diagnostic mammography with tomography and right breast ultrasonography at [facility] on 03/12/2018 showing: breast density category C. The is architectural distortion at the 11 o'clock position. There is also an oval mass in the right breast anterior depth. Examination of the right axilla showed enlarged lymph nodes. Ultrasonography showed a 4.6 cm mass in the right breast upper outer quadrant posterior depth. An additional 1.2 cm oval mass in the right breast 12 o'clock middle depth. The lymph nodes in the right axillary are highly suggestive of malignancy.   Accordingly on 03/16/2018 she proceeded to biopsy of the right breast area in question. The pathology from this procedure showed ([id number]): At both the 11 and 12 o'clock positions: Invasive ductal carcinoma grade II. Ductal carcinoma in situ, high grade, with necrosis and calcifications. Prognostic indicators significant for: estrogen receptor, 90% positive and progesterone receptor, 40% positive, both with strong staining intensity. Proliferation marker Ki67 at 80%. HER2 amplified with ratios HER2/CEP17 SIGNALS 6.90 and  average HER2 copies per cell 14.50  The patient's subsequent history is as detailed below.   PAST MEDICAL HISTORY: Past Medical History:  Diagnosis Date  . Anxiety   . Breast cancer, left breast ([place]) 2000   Underwent  lumpectomy, chemotherapy and radiation  . Facial paralysis/Bells palsy    left side  . Family history of lung cancer   . Family history of lymphoma   . Hypertension   . Neuropathy    IN FINGERS AND TOES   RECENT INFUSION OF CHEMO  . Personal history of breast cancer 05/06/2018  . Renal disorder    just after TIA acute kidney injury  . TIA (transient ischemic attack) 03/16/2017   [facility]   The patient has a previous history of left breast cancer diagnosed in 2000. She was seen by [facility] in [place], [place] under Dr. Loreta Ave. According to the patient, her left breast cancer was stage II with no lymph node involvement (likely T2N0). She had chemotherapy every 3 weeks for about 6 months. She did not takes any "red drugs." She also did not take anti-estrogens (likely ER/PR negative).   TIA in 2018. She saw a neurologist as a precaution. She denies any clotting issues.    PAST SURGICAL HISTORY: Past Surgical History:  Procedure Laterality Date  . BREAST LUMPECTOMY WITH AXILLARY LYMPH NODE BIOPSY Left 2000   biopsy  . BREAST SURGERY     partial mastectomy with lymph node removal  . MASTECTOMY Right 2019   & partial mastectomy left breast 2000  . MASTECTOMY MODIFIED RADICAL Right 08/26/2018   Procedure: MASTECTOMY MODIFIED RADICAL;  Surgeon: Jovita Kussmaul, MD;  Location: [facility];  Service: General;  Laterality: Right;  . MODIFIED MASTECTOMY Right 08/26/2018  . PORTACATH PLACEMENT Left 04/08/2018   Procedure: INSERTION PORT-A-CATH;  Surgeon: Jovita Kussmaul, MD;  Location: [facility];  Service: General;  Laterality: Left;  . TUBAL LIGATION Bilateral 2004  . WISDOM TOOTH EXTRACTION      FAMILY HISTORY Family History  Problem Relation Age of Onset  . Hypertension Mother   . Hypertension Father   . Diverticulosis Father   . Diabetes Father   . Lung cancer Father 51       hx smoking  . Hypertension Maternal Grandmother   . CAD Maternal Grandmother   .  Alzheimer's disease Maternal Grandmother        died at 85  . Hypertension Paternal Grandmother   . CAD Paternal Grandmother   . Stroke Paternal Grandfather   . Sickle cell trait Paternal Grandfather   . Pneumonia Maternal Aunt        died at a young adult  . Lymphoma Paternal Aunt 41   The patient's father is alive at age 76 and had a history of lung cancer. The patient's mother is alive at age 41. The patient has 1 brother and no sisters. She denies a family history of breast or ovarian cancer.     GYNECOLOGIC HISTORY:  No LMP recorded. (Menstrual status: Chemotherapy). Menarche: 49 years old Age at first live birth: 49 years old She is GXP3. She is no longer having periods, which were irregular and light. Her LMP was  January 2019. She is not having hot flashes. The patient used oral contraceptive from [phone] and the Depo-provera shot from [phone] with no complications. She never used HRT.    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  She is now  working at [employer] as a Occupational psychologist.  Her husband, [name], is a Administrator. The patient's son, Shea Stakes age 30, works at [employer] in [place]. The patient's daughters, Lilia Pro age 63, and Levonne Spiller age 10, are students. The patient's adopted son, Vonna Kotyk age 61, is also a Ship broker.     ADVANCED DIRECTIVES: In the absence of any documents to the contrary the patient's husband is her healthcare power of attorney   HEALTH MAINTENANCE: Social History   Tobacco Use  . Smoking status: Never Smoker  . Smokeless tobacco: Never Used  Vaping Use  . Vaping Use: Never used  Substance Use Topics  . Alcohol use: No  . Drug use: No     Colonoscopy: n/a  PAP: 2015  Bone density: never done   Allergies  Allergen Reactions  . Ace Inhibitors Swelling    Swelling of lips and face  . Lisinopril Swelling    Swelling of lips, face  . Amlodipine Swelling    Leg swelling  . Adhesive [Tape] Itching and  Rash    Please use paper tape  . Latex Itching and Rash    Current Outpatient Medications  Medication Sig Dispense Refill  . acyclovir (ZOVIRAX) 400 MG tablet Take 1 tablet (400 mg total) by mouth 2 (two) times daily. 60 tablet 1  . anastrozole (ARIMIDEX) 1 MG tablet Take 1 tablet (1 mg total) by mouth daily. 90 tablet 4  . aspirin EC 81 MG tablet Take 81 mg by mouth daily.    . carvedilol (COREG) 3.125 MG tablet TAKE 1 TABLET (3.125 MG TOTAL) BY MOUTH 2 (TWO) TIMES DAILY WITH A MEAL. 180 tablet 2  . Cholecalciferol (VITAMIN D3 PO) Take by mouth daily.    . Cyanocobalamin (VITAMIN B12) 500 MCG TABS Take 500 mcg by mouth daily.     Marland Kitchen lidocaine-prilocaine (EMLA) cream Apply 1 application topically as needed.    Marland Kitchen losartan (COZAAR) 50 MG tablet Take 1 tablet (50 mg total) by mouth daily. 30 tablet 11  . magic mouthwash w/lidocaine SOLN Take 5 mLs by mouth 4 (four) times daily as needed for mouth pain. 240 mL 3  . Multiple Vitamin (MULITIVITAMIN WITH MINERALS) TABS Take 1 tablet by mouth daily.      . prochlorperazine (COMPAZINE) 10 MG tablet Take 1 tablet (10 mg total) by mouth every 6 (six) hours as needed (Nausea or vomiting). 60 tablet 1  . spironolactone-hydrochlorothiazide (ALDACTAZIDE) 25-25 MG tablet TAKE 1 TABLET BY MOUTH EVERY DAY 30 tablet 2  . venlafaxine XR (EFFEXOR-XR) 37.5 MG 24 hr capsule TAKE 1 CAPSULE BY MOUTH DAILY WITH BREAKFAST. 90 capsule 2  . ferrous sulfate 325 (65 FE) MG tablet Take 1 tablet (325 mg total) by mouth daily. 30 tablet 0   No current facility-administered medications for this visit.    OBJECTIVE;  African-American woman in no acute distress  Vitals:   07/20/20 0918  BP: 118/66  Pulse: 75  Resp: 18  Temp: 98.2 F (36.8 C)  SpO2: 100%   Wt Readings from Last 3 Encounters:  07/20/20 198 lb 12.8 oz (90.2 kg)  06/29/20 199 lb 8 oz (90.5 kg)  06/16/20 203 lb 1.6 oz (92.1 kg)   Body mass index is 40.15 kg/m.    ECOG FS:1 - Symptomatic but  completely ambulatory GENERAL: Patient is a well appearing female in no acute distress HEENT:  Sclerae anicteric. Mask in place. Neck is supple.  NODES:  No cervical, supraclavicular, or axillary lymphadenopathy palpated.  BREAST EXAM:  Deferred. LUNGS:  Clear to auscultation bilaterally.  No wheezes or rhonchi. HEART:  Regular rate and rhythm. No murmur appreciated. ABDOMEN:  Soft, nontender.  Positive, normoactive bowel sounds. No organomegaly palpated. MSK:  No focal spinal tenderness to palpation. Full range of motion bilaterally in the upper extremities. EXTREMITIES:  No peripheral edema.   SKIN:  Clear with no obvious rashes or skin changes. No nail dyscrasia. NEURO:  Nonfocal. Well oriented.  Appropriate affect.   LAB RESULTS:  CMP     Component Value Date/Time   NA 137 06/29/2020 0857   K 4.1 06/29/2020 0857   CL 106 06/29/2020 0857   CO2 22 06/29/2020 0857   GLUCOSE 105 (H) 06/29/2020 0857   BUN 29 (H) 06/29/2020 0857   CREATININE 1.37 (H) 06/29/2020 0857   CREATININE 1.36 (H) 05/19/2019 0832   CALCIUM 11.9 (H) 06/29/2020 0857   CALCIUM 11.2 (H) 02/24/2020 0830   PROT 8.1 06/29/2020 0857   ALBUMIN 3.0 (L) 06/29/2020 0857   AST 130 (H) 06/29/2020 0857   AST 19 05/19/2019 0832   ALT 80 (H) 06/29/2020 0857   ALT 24 05/19/2019 0832   ALKPHOS 128 (H) 06/29/2020 0857   BILITOT 0.5 06/29/2020 0857   BILITOT 0.3 05/19/2019 0832   GFRNONAA 45 (L) 06/29/2020 0857   GFRNONAA 46 (L) 05/19/2019 0832   GFRAA 53 (L) 06/29/2020 0857   GFRAA 54 (L) 05/19/2019 0832    Lab Results  Component Value Date   TOTALPROTELP 7.0 02/25/2019     Lab Results  Component Value Date   KPAFRELGTCHN 76.7 (H) 02/25/2019   LAMBDASER 27.2 (H) 02/25/2019   KAPLAMBRATIO 2.82 (H) 02/25/2019    Lab Results  Component Value Date   WBC 4.5 07/20/2020   NEUTROABS 2.7 07/20/2020   HGB 9.8 (L) 07/20/2020   HCT 30.5 (L) 07/20/2020   MCV 84.0 07/20/2020   PLT 79 (L) 07/20/2020   No results  found for: LABCA2  No components found for: [id number]  No results for input(s): INR in the last 168 hours.  No results found for: LABCA2  No results found for: FOY774  No results found for: JOI786  No results found for: VEH209  Lab Results  Component Value Date   CA2729 38.7 (H) 02/25/2019    No components found for: HGQUANT  No results found for: CEA1 / No results found for: CEA1   No results found for: AFPTUMOR  No results found for: CHROMOGRNA  No results found for: HGBA, HGBA2QUANT, HGBFQUANT, HGBSQUAN (Hemoglobinopathy evaluation)   No results found for: LDH  No results found for: IRON, TIBC, IRONPCTSAT (Iron and TIBC)  No results found for: FERRITIN  Urinalysis    Component Value Date/Time   COLORURINE YELLOW 08/18/2019 [place] 08/18/2019 1420   LABSPEC 1.016 08/18/2019 1420   PHURINE 5.0 08/18/2019 [place] 08/18/2019 [place] 08/18/2019 [place] 08/18/2019 1420   KETONESUR NEGATIVE 08/18/2019 1420   PROTEINUR NEGATIVE 08/18/2019 1420   UROBILINOGEN 0.2 11/29/2011 1656   NITRITE NEGATIVE 08/18/2019 1420   LEUKOCYTESUR SMALL (A) 08/18/2019 1420     STUDIES: ECHOCARDIOGRAM COMPLETE  Result Date: 07/12/2020    ECHOCARDIOGRAM REPORT   Patient Name:   [name] [name] Date of Exam: 07/12/2020 Medical Rec #:  [id number]     Height:       59.0 in Accession #:    [id number]    Weight:  199.5 lb Date of Birth:  October 24, 1971     BSA:          1.842 m Patient Age:    68 years      BP:           119/85 mmHg Patient Gender: F             HR:           79 bpm. Exam Location:  Inpatient Procedure: 2D Echo Indications:    chemo Z09  History:        Patient has prior history of Echocardiogram examinations, most                 recent 03/08/2020. Risk Factors:Hypertension.  Sonographer:    Mikki Santee RDCS (AE) Referring Phys: [id number] DANIEL R BENSIMHON IMPRESSIONS  1. Mild intracavitary gradient. Peak  velocity 0.98 m/s. Peak gradient 3.8 mmHg. Left ventricular ejection fraction, by estimation, is 65 to 70%. The left ventricle has normal function. The left ventricle has no regional wall motion abnormalities. There is mild concentric left ventricular hypertrophy. Left ventricular diastolic parameters are indeterminate. The average left ventricular global longitudinal strain is -23.7 %. The global longitudinal strain is normal.  2. Right ventricular systolic function is normal. The right ventricular size is normal. There is normal pulmonary artery systolic pressure.  3. Left atrial size was moderately dilated.  4. The mitral valve is normal in structure. Trivial mitral valve regurgitation. No evidence of mitral stenosis.  5. The aortic valve is tricuspid. Aortic valve regurgitation is not visualized. No aortic stenosis is present.  6. The inferior vena cava is normal in size with greater than 50% respiratory variability, suggesting right atrial pressure of 3 mmHg. FINDINGS  Left Ventricle: Mild intracavitary gradient. Peak velocity 0.98 m/s. Peak gradient 3.8 mmHg. Left ventricular ejection fraction, by estimation, is 65 to 70%. The left ventricle has normal function. The left ventricle has no regional wall motion abnormalities. The average left ventricular global longitudinal strain is -23.7 %. The global longitudinal strain is normal. The left ventricular internal cavity size was normal in size. There is mild concentric left ventricular hypertrophy. Left ventricular diastolic parameters are indeterminate. Right Ventricle: The right ventricular size is normal. No increase in right ventricular wall thickness. Right ventricular systolic function is normal. There is normal pulmonary artery systolic pressure. The tricuspid regurgitant velocity is 2.29 m/s, and  with an assumed right atrial pressure of 3 mmHg, the estimated right ventricular systolic pressure is 54.6 mmHg. Left Atrium: Left atrial size was moderately  dilated. Right Atrium: Right atrial size was normal in size. Pericardium: There is no evidence of pericardial effusion. Mitral Valve: The mitral valve is normal in structure. Normal mobility of the mitral valve leaflets. Trivial mitral valve regurgitation. No evidence of mitral valve stenosis. Tricuspid Valve: The tricuspid valve is normal in structure. Tricuspid valve regurgitation is trivial. No evidence of tricuspid stenosis. Aortic Valve: The aortic valve is tricuspid. Aortic valve regurgitation is not visualized. No aortic stenosis is present. Pulmonic Valve: The pulmonic valve was normal in structure. Pulmonic valve regurgitation is trivial. No evidence of pulmonic stenosis. Aorta: The aortic root is normal in size and structure. Venous: The inferior vena cava is normal in size with greater than 50% respiratory variability, suggesting right atrial pressure of 3 mmHg. IAS/Shunts: No atrial level shunt detected by color flow Doppler.  LEFT VENTRICLE PLAX 2D LVIDd:         4.30 cm  Diastology  LVIDs:         2.80 cm  LV e' lateral:   5.87 cm/s LV PW:         1.30 cm  LV E/e' lateral: 13.9 LV IVS:        1.30 cm  LV e' medial:    7.83 cm/s LVOT diam:     2.00 cm  LV E/e' medial:  10.4 LV SV:         85 LV SV Index:   46       2D Longitudinal Strain LVOT Area:     3.14 cm 2D Strain GLS (A2C):   -24.9 %                         2D Strain GLS (A3C):   -19.7 %                         2D Strain GLS (A4C):   -26.5 %                         2D Strain GLS Avg:     -23.7 % RIGHT VENTRICLE RV S prime:     16.90 cm/s TAPSE (M-mode): 2.2 cm LEFT ATRIUM             Index       RIGHT ATRIUM           Index LA diam:        3.90 cm 2.12 cm/m  RA Area:     15.90 cm LA Vol (A2C):   45.7 ml 24.80 ml/m RA Volume:   39.70 ml  21.55 ml/m LA Vol (A4C):   73.2 ml 39.73 ml/m LA Biplane Vol: 58.5 ml 31.75 ml/m  AORTIC VALVE LVOT Vmax:   130.00 cm/s LVOT Vmean:  95.300 cm/s LVOT VTI:    0.269 m  AORTA Ao Root diam: 3.00 cm MITRAL VALVE                TRICUSPID VALVE MV Area (PHT): 3.06 cm    TR Peak grad:   21.0 mmHg MV Decel Time: 248 msec    TR Vmax:        229.00 cm/s MV E velocity: 81.40 cm/s MV A velocity: 67.90 cm/s  SHUNTS MV E/A ratio:  1.20        Systemic VTI:  0.27 m                            Systemic Diam: 2.00 cm Skeet Latch MD Electronically signed by Skeet Latch MD Signature Date/Time: 07/12/2020/9:20:03 AM    Final     ELIGIBLE FOR AVAILABLE RESEARCH PROTOCOL: no   ASSESSMENT: 49 y.o. [place], [place] woman  (1) status post left lumpectomy in 2000 for a (?) T2N0 breast cancer,  (a) status post adjuvant chemotherapy  (b) status post adjuvant radiation  (c) did not receive antiestrogens  (2) genetics testing May 18, 2018 through the common Hereditary Cancers Panel + Myelodysplastic Syndrome/Leukemia Panel found no deleterious mutations in APC, ATM, AXIN2, BARD1, BLM, BMPR1A, BRCA1, BRCA2, BRIP1, CDH1, CDK4, CDKN2A (p14ARF), CDKN2A (p16INK4a), CEBPA, CHEK2, CTNNA1, DICER1, EPCAM*, GATA2, GREM1*, HRAS, KIT, MEN1, MLH1, MSH2, MSH3, MSH6, MUTYH, NBN, NF1, PALB2, PDGFRA, PMS2, POLD1, POLE, PTEN, RAD50, RAD51C, RAD51D, RUNX1, SDHB, SDHC, SDHD, SMAD4, SMARCA4, STK11, TERC, TERT, TP53, TSC1, TSC2, VHL The  following genes were evaluated for sequence changes only: HOXB13*, NTHL1*, SDHA  (a)  3 variants of uncertain significance were identified in the genes BARD1 c.764A>G (p.Asn255Ser), BRIP1 c.2563C>T (p.Arg855Cys), and PALB2 c.3103A>T (p.Ile1035Phe).   (3) status post right breast biopsy 03/16/2018 for a clinical mT2-3 N2, anatomic stage III invasive ductal carcinoma, grade 2, triple positive, with an MIB-1 of 80%.  (a) bone scan and chest CT scan negative for metastases except for a T6 lytic lesion of concern  (b) thoracic spine MRI was ordered May 2019 but never performed.  (4) neoadjuvant chemotherapy consisting of carboplatin, docetaxel, trastuzumab and Pertuzumab every 3 weeks x 6 starting 04/16/2018  (a)  pertuzumab held beginning with cycle 2 because of diarrhea  (5) trastuzumab to be continued Q4w indefinitely, discontinued after 10/21/2019 dose with progression  (a) echocardiogram on 06/29/2018 showed an ejection fraction in the 65-70% range  (b) echocardiogram 11/19/2018 showed an ejection fraction in the 60-65% range  (c) echo 04/07/2019 shows an ejection fraction in the 55-60% range  (d) echo 07/15/2019 shows EF of 60-65%  (6) right mastectomy and sentinel lymph node sampling on 08/26/2018 found a residual 2.5cm invasive ductal carcinoma, with 2 of 5 sentinel lymph nodes positive, ypT2, ypN1a; margins were clear  (7) adjuvant radiation 09/30/2018 - 11/16/2018 Site/dose:   The patient initially received a dose of 50.4 Gy in 28 fractions to the right chest wall and supraclavicular region. This was delivered using a 3-D conformal, 4 field technique. The patient then received a boost to the mastectomy scar. This delivered an additional 10 Gy in 5 fractions using an en face electron field. The total dose was 60.4   (8) anastrozole started 12/03/2018  (a) [place] on 10/01/2018 was 64.3, and estradiol 7.0, consistent with menopause  (b) referral to pelvic floor rehabilitation 12/03/2018  (c) [place] on 03/16/2020 was 80.8 with estradiol less than 2.5   METASTATIC DISEASE: March 2020 (9) CT of neck and CT angiography of the chest 02/04/2019 finds the T6 lytic lesion noted May 2019 (see #3 above) is now sclerotic; a sclerotic T3 lesion is stable; there is a new lytic lesion in the manubrium  (a) PET scan 03/12/2019 shows manubrium, T3 and T5 metastases, no visceral disease  (b) biopsy of manubrial lesion planned but not done secondary to pandemic  (c) Bone scan 09/01/19 shows ? progression of disease in spine; MRI 09/02/19 shows early metastases in T10 (new), T3 and T5 stable; CT chest 09/01/19 shows 1mm pulmonary nodules that are new, but non specific and warrant future follow up  (d) PET 10/26/2019 shows  no lung or liver lesions; new mediastinal adenopathy, bone lesions  (10) SRS to spine, palliative treatment to sternum, from 05/13/2019 through 05/26/2019:  1. Chest_sternum // 40 Gy in 10 fractions  2. Thoracic Spine (T3) // 18 Gy in 1 fraction   3. Thoracic Spine (T5) // 18 Gy in 1 fraction  (11) denosumab/Xgeva started 09/08/2019  (12) T-DM1 started 01/15/20201  (a) echo 10/26/2019 shows stable EF at 55-60%  (b) echo 03/08/2020 shows an ejection fraction in the 70-75% range  (c) PET scan 03/08/2020 shows no active disease; a 0.9 cm subcutaneous nodule in the left shoulder area seen on the PET was not identifiable by exam 03/16/2020   PLAN: [name] is here for f/u of her metastatic breast cancer on treatment with TDM1.  She is tolerating this well, with the exception of moderate thrombocytopenia, which we have been treating her through.  She is having some  mild nose bleeds and we discussed saline nasal spray and afrin if needed to help constrict the vessels in her nose.  She undertsands that afrin can only be used intermittently, as it can cause rebound congestion.  Her nausea, vomiting, and RUQ pain, are concerning for cholecystitis.  I have ordered a RUQ ultrasound to fully evaluate, and this will take place on 07/25/2020.    I think Victoriana's fatigue and achiness is likely multifactorial considering her receiving treatment, work requirements, and normal family and outside stressors (such as covid).  She will continue ot exercise, and I congratulated her on her weight loss.  [name] will undergo her PET scan on 08/24/2020.  She will see Dr. Jana Hakim on 08/30/2020 to review those results and make treatment decisions.  She knows to call for any questions that may arise between now and her next appointment.  We are happy to see her sooner if needed.   Total encounter time 45 minutes.Wilber Bihari, NP 07/20/20 9:36 AM Medical Oncology and Hematology [facility] [place], [place] [address] Tel. [phone]    Fax. [phone]   *Total Encounter Time as defined by the Centers for Medicare and Medicaid Services includes, in addition to the face-to-face time of a patient visit (documented in the note above) non-face-to-face time: obtaining and reviewing outside history, ordering and reviewing medications, tests or procedures, care coordination (communications with other health care professionals or caregivers) and documentation in the medical record.

## 2020-07-20 NOTE — Patient Instructions (Signed)
[facility] Discharge Instructions for Patients Receiving Chemotherapy  Today you received the following chemotherapy agents Kadcyla  To help prevent nausea and vomiting after your treatment, we encourage you to take your nausea medication as directed   If you develop nausea and vomiting that is not controlled by your nausea medication, call the clinic.   BELOW ARE SYMPTOMS THAT SHOULD BE REPORTED IMMEDIATELY:  *FEVER GREATER THAN 100.5 F  *CHILLS WITH OR WITHOUT FEVER  NAUSEA AND VOMITING THAT IS NOT CONTROLLED WITH YOUR NAUSEA MEDICATION  *UNUSUAL SHORTNESS OF BREATH  *UNUSUAL BRUISING OR BLEEDING  TENDERNESS IN MOUTH AND THROAT WITH OR WITHOUT PRESENCE OF ULCERS  *URINARY PROBLEMS  *BOWEL PROBLEMS  UNUSUAL RASH Items with * indicate a potential emergency and should be followed up as soon as possible.  Feel free to call the clinic should you have any questions or concerns. The clinic phone number is [phone].  Please show the CHEMO ALERT CARD at check-in to the Emergency Department and triage nurse.   

## 2020-07-21 ENCOUNTER — Telehealth: Payer: Self-pay | Admitting: Adult Health

## 2020-07-21 NOTE — Telephone Encounter (Signed)
No 8/19 los. No changes made to pt's schedule.

## 2020-07-25 ENCOUNTER — Ambulatory Visit (HOSPITAL_COMMUNITY)
Admission: RE | Admit: 2020-07-25 | Discharge: 2020-07-25 | Disposition: A | Discharge status: Home / Self Care | Payer: BC Managed Care – PPO | Source: Ambulatory Visit | Attending: Adult Health | Admitting: Adult Health

## 2020-07-25 ENCOUNTER — Other Ambulatory Visit: Payer: Self-pay

## 2020-07-25 DIAGNOSIS — R1011 Right upper quadrant pain: Secondary | ICD-10-CM | POA: Diagnosis not present

## 2020-07-25 DIAGNOSIS — Z170 Estrogen receptor positive status [ER+]: Secondary | ICD-10-CM | POA: Diagnosis not present

## 2020-07-25 DIAGNOSIS — R1114 Bilious vomiting: Secondary | ICD-10-CM | POA: Diagnosis not present

## 2020-07-25 DIAGNOSIS — C50411 Malignant neoplasm of upper-outer quadrant of right female breast: Secondary | ICD-10-CM | POA: Diagnosis not present

## 2020-07-26 ENCOUNTER — Telehealth: Payer: Self-pay

## 2020-07-26 NOTE — Telephone Encounter (Signed)
Attempted to return pt call to give below information. Pt did not answer, LVM with instructions to CB for results.

## 2020-07-26 NOTE — Telephone Encounter (Signed)
-----   Message from Gardenia Phlegm, NP sent at 07/26/2020 11:50 AM EDT ----- Please let patient know that there is nothing in the liver or gall bladder that explains her pain.   ----- Message ----- From: Interface, Rad Results In Sent: 07/25/2020   3:18 PM EDT To: Gardenia Phlegm, NP

## 2020-07-31 ENCOUNTER — Ambulatory Visit (HOSPITAL_COMMUNITY)
Admission: RE | Admit: 2020-07-31 | Discharge: 2020-07-31 | Disposition: A | Discharge status: Home / Self Care | Payer: BC Managed Care – PPO | Source: Ambulatory Visit | Attending: Oncology | Admitting: Oncology

## 2020-07-31 ENCOUNTER — Inpatient Hospital Stay: Payer: BC Managed Care – PPO

## 2020-07-31 ENCOUNTER — Inpatient Hospital Stay (HOSPITAL_BASED_OUTPATIENT_CLINIC_OR_DEPARTMENT_OTHER): Payer: BC Managed Care – PPO | Admitting: Medical

## 2020-07-31 ENCOUNTER — Other Ambulatory Visit: Payer: Self-pay

## 2020-07-31 ENCOUNTER — Other Ambulatory Visit: Payer: Self-pay | Admitting: *Deleted

## 2020-07-31 VITALS — BP 126/72 | HR 82 | Temp 98.3°F | Resp 18 | Ht 59.0 in | Wt 191.9 lb

## 2020-07-31 DIAGNOSIS — R112 Nausea with vomiting, unspecified: Secondary | ICD-10-CM

## 2020-07-31 DIAGNOSIS — R197 Diarrhea, unspecified: Secondary | ICD-10-CM

## 2020-07-31 DIAGNOSIS — Z170 Estrogen receptor positive status [ER+]: Secondary | ICD-10-CM

## 2020-07-31 DIAGNOSIS — C50411 Malignant neoplasm of upper-outer quadrant of right female breast: Secondary | ICD-10-CM

## 2020-07-31 DIAGNOSIS — C7951 Secondary malignant neoplasm of bone: Secondary | ICD-10-CM

## 2020-07-31 DIAGNOSIS — K1379 Other lesions of oral mucosa: Secondary | ICD-10-CM

## 2020-07-31 DIAGNOSIS — R1011 Right upper quadrant pain: Secondary | ICD-10-CM

## 2020-07-31 DIAGNOSIS — Z79899 Other long term (current) drug therapy: Secondary | ICD-10-CM | POA: Diagnosis not present

## 2020-07-31 DIAGNOSIS — Z95828 Presence of other vascular implants and grafts: Secondary | ICD-10-CM

## 2020-07-31 DIAGNOSIS — Z5112 Encounter for antineoplastic immunotherapy: Secondary | ICD-10-CM | POA: Diagnosis not present

## 2020-07-31 LAB — CBC WITH DIFFERENTIAL (CANCER CENTER ONLY)
Abs Immature Granulocytes: 0.02 10*3/uL (ref 0.00–0.07)
Basophils Absolute: 0 10*3/uL (ref 0.0–0.1)
Basophils Relative: 1 %
Eosinophils Absolute: 0.1 10*3/uL (ref 0.0–0.5)
Eosinophils Relative: 2 %
HCT: 33.8 % — ABNORMAL LOW (ref 36.0–46.0)
Hemoglobin: 10.9 g/dL — ABNORMAL LOW (ref 12.0–15.0)
Immature Granulocytes: 0 %
Lymphocytes Relative: 22 %
Lymphs Abs: 1.2 10*3/uL (ref 0.7–4.0)
MCH: 26.7 pg (ref 26.0–34.0)
MCHC: 32.2 g/dL (ref 30.0–36.0)
MCV: 82.8 fL (ref 80.0–100.0)
Monocytes Absolute: 1.1 10*3/uL — ABNORMAL HIGH (ref 0.1–1.0)
Monocytes Relative: 19 %
Neutro Abs: 3.1 10*3/uL (ref 1.7–7.7)
Neutrophils Relative %: 56 %
Platelet Count: 97 10*3/uL — ABNORMAL LOW (ref 150–400)
RBC: 4.08 MIL/uL (ref 3.87–5.11)
RDW: 18.7 % — ABNORMAL HIGH (ref 11.5–15.5)
WBC Count: 5.5 10*3/uL (ref 4.0–10.5)
nRBC: 0 % (ref 0.0–0.2)

## 2020-07-31 LAB — CMP (CANCER CENTER ONLY)
ALT: 81 U/L — ABNORMAL HIGH (ref 0–44)
AST: 134 U/L — ABNORMAL HIGH (ref 15–41)
Albumin: 3.3 g/dL — ABNORMAL LOW (ref 3.5–5.0)
Alkaline Phosphatase: 121 U/L (ref 38–126)
Anion gap: 11 (ref 5–15)
BUN: 24 mg/dL — ABNORMAL HIGH (ref 6–20)
CO2: 24 mmol/L (ref 22–32)
Calcium: 11.9 mg/dL — ABNORMAL HIGH (ref 8.9–10.3)
Chloride: 103 mmol/L (ref 98–111)
Creatinine: 1.56 mg/dL — ABNORMAL HIGH (ref 0.44–1.00)
GFR, Est AFR Am: 45 mL/min — ABNORMAL LOW (ref >60)
GFR, Estimated: 39 mL/min — ABNORMAL LOW (ref >60)
Glucose, Bld: 107 mg/dL — ABNORMAL HIGH (ref 70–99)
Potassium: 3.5 mmol/L (ref 3.5–5.1)
Sodium: 138 mmol/L (ref 135–145)
Total Bilirubin: 0.9 mg/dL (ref 0.3–1.2)
Total Protein: 8.2 g/dL — ABNORMAL HIGH (ref 6.5–8.1)

## 2020-07-31 LAB — MAGNESIUM: Magnesium: 1.6 mg/dL — ABNORMAL LOW (ref 1.7–2.4)

## 2020-07-31 MED ORDER — MAGNESIUM SULFATE 2 GM/50ML IV SOLN
2.0000 g | Freq: Once | INTRAVENOUS | Status: AC
  Administered 2020-07-31: 2 g via INTRAVENOUS

## 2020-07-31 MED ORDER — DIPHENOXYLATE-ATROPINE 2.5-0.025 MG PO TABS
ORAL_TABLET | ORAL | Status: AC
  Filled 2020-07-31: qty 1

## 2020-07-31 MED ORDER — ONDANSETRON HCL 4 MG/2ML IJ SOLN
INTRAMUSCULAR | Status: AC
  Filled 2020-07-31: qty 4

## 2020-07-31 MED ORDER — MAGIC MOUTHWASH W/LIDOCAINE: 5.0000 mL | Freq: Four times a day (QID) | ORAL | 3 refills | Status: DC | PRN

## 2020-07-31 MED ORDER — SODIUM CHLORIDE 0.9% IV SOLUTION
Freq: Once | INTRAVENOUS | Status: AC
  Filled 2020-07-31: qty 1000

## 2020-07-31 MED ORDER — ONDANSETRON HCL 4 MG/2ML IJ SOLN
8.0000 mg | Freq: Once | INTRAMUSCULAR | Status: AC
  Administered 2020-07-31: 8 mg via INTRAVENOUS

## 2020-07-31 MED ORDER — MAGNESIUM SULFATE 2 GM/50ML IV SOLN
INTRAVENOUS | Status: AC
  Filled 2020-07-31: qty 50

## 2020-07-31 MED ORDER — DIPHENOXYLATE-ATROPINE 2.5-0.025 MG PO TABS: ORAL_TABLET | ORAL | 2 refills | Status: DC

## 2020-07-31 MED ORDER — LIDOCAINE VISCOUS HCL 2 % MT SOLN: 5.0000 mL | OROMUCOSAL | 1 refills | Status: DC | PRN

## 2020-07-31 MED ORDER — SODIUM CHLORIDE 0.9 % IV SOLN
Freq: Once | INTRAVENOUS | Status: AC
  Filled 2020-07-31: qty 250

## 2020-07-31 MED ORDER — HEPARIN SOD (PORK) LOCK FLUSH 100 UNIT/ML IV SOLN
500.0000 [IU] | Freq: Once | INTRAVENOUS | Status: AC
  Administered 2020-07-31: 500 [IU]
  Filled 2020-07-31: qty 5

## 2020-07-31 MED ORDER — DIPHENOXYLATE-ATROPINE 2.5-0.025 MG PO TABS
1.0000 | ORAL_TABLET | Freq: Once | ORAL | Status: AC
  Administered 2020-07-31: 1 via ORAL

## 2020-07-31 MED ORDER — ONDANSETRON 8 MG PO TBDP: 8.0000 mg | ORAL_TABLET | Freq: Three times a day (TID) | ORAL | 2 refills | Status: DC | PRN

## 2020-07-31 MED ORDER — SODIUM CHLORIDE 0.9% FLUSH
10.0000 mL | Freq: Once | INTRAVENOUS | Status: AC
  Administered 2020-07-31: 10 mL
  Filled 2020-07-31: qty 10

## 2020-07-31 NOTE — Patient Instructions (Addendum)
    Return to Work  [place] Erdman was treated at our facility.  Injury or illness was: X Not work-related.   Return to work  Employee may return to work on 08/03/2020   Health care provider name (printed): [place] care provider (signature): _________________________________________   Date: 07/31/20

## 2020-08-01 NOTE — Telephone Encounter (Signed)
No entry 

## 2020-08-03 NOTE — Progress Notes (Signed)
[facility] Progress Note   [name] [name] [id number] [birth year] 49 y.o.  [name] Lohn is managed by Dr. Lurline Del  Actively treated with chemotherapy/immunotherapy/hormonal therapy: yes  Current therapy: Kadcyla  Last treated: 07/20/2020 (cycle 10, day 1)  Next scheduled appointment with provider: 08/30/2020  Assessment: Plan:    Non-intractable vomiting with nausea, unspecified vomiting type - Plan: ondansetron (ZOFRAN) injection 8 mg, 0.9 %  sodium chloride infusion, 0.9 %  sodium chloride infusion (Manually program via Guardrails IV Fluids), ondansetron (ZOFRAN ODT) 8 MG disintegrating tablet  Diarrhea, unspecified type - Plan: diphenoxylate-atropine (LOMOTIL) 2.5-0.025 MG per tablet 1 tablet, 0.9 %  sodium chloride infusion (Manually program via Guardrails IV Fluids), diphenoxylate-atropine (LOMOTIL) 2.5-0.025 MG tablet  Hypomagnesemia - Plan: magnesium sulfate IVPB 2 g 50 mL, 0.9 %  sodium chloride infusion (Manually program via Guardrails IV Fluids)  Oral pain - Plan: lidocaine (XYLOCAINE) 2 % solution, magic mouthwash w/lidocaine SOLN  Port-A-Cath in place - Plan: heparin lock flush 100 unit/mL, sodium chloride flush (NS) 0.9 % injection 10 mL  Malignant neoplasm of upper-outer quadrant of right breast in female, estrogen receptor positive ([place]) - Plan: heparin lock flush 100 unit/mL, sodium chloride flush (NS) 0.9 % injection 10 mL   Nausea and vomiting: The patient was given Zofran 8 mg IV x1 and was given a prescription for Zofran ODT 8 mg.  Diarrhea: The patient was given Lomotil 1 tablet p.o. x1 and was given a prescription for Lomotil also.  Hypomagnesemia: The patient's magnesium level returned at 1.6 today.  She was given mag cesium 2 g and 1 L of normal saline over 1 hour.  Mucositis: The patient was given a prescription for viscous lidocaine and for Magic mouthwash.  ER positive malignant neoplasm of the right breast: The patient  continues to be followed by Dr. Jana Hakim and is status post cycle 10, day 1 of Kadcyla which was dosed on 07/20/2020.  She is scheduled to return to be seen next on 08/30/2020.   Please see After Visit Summary for patient specific instructions.  Future Appointments  Date Time Provider [place]  08/10/2020 11:30 AM [facility] LAB [facility] None  08/10/2020 12:00 PM [facility] [place] FLUSH [facility] None  08/10/2020  1:00 PM [facility] INFUSION [facility] None  08/24/2020  9:00 AM [facility] PET CT 1 [facility] [place]  08/30/2020  7:45 AM [facility] LAB [facility] None  08/30/2020  8:00 AM [facility] [place] None  08/30/2020  8:30 AM Magrinat, Virgie Dad, MD [facility] None  08/30/2020  9:30 AM [facility] INFUSION [facility] None  08/30/2020 10:30 AM Jennet Maduro, RD [facility] None    No orders of the defined types were placed in this encounter.      Subjective:   Patient ID:  [name] [name] is a 49 y.o. (DOB 05-12-1971) female.  Chief Complaint:  Chief Complaint  Patient presents with  . Emesis  . Diarrhea  . Abdominal Pain    HPI [name] Alpern  is a 49 y.o. female with a diagnosis of an ER positive malignant neoplasm of the right breast.  She continues to be followed by Dr. Jana Hakim and is status post cycle 10, day 1 of Kadcyla which was dosed on 07/20/2020.  She presents to the clinic today with a report of nausea, vomiting, and diarrhea over several days.  She is additionally had anorexia, fatigue, and abdominal pain in her right upper quadrant.  She also reports intermittent chills and sores in her mouth.  She denies  chest pain, shortness of breath, GERD, fevers or diaphoresis.  Medications: I have reviewed the patient's current medications.  Allergies:  Allergies  Allergen Reactions  . Ace Inhibitors Swelling    Swelling of lips and face  . Lisinopril Swelling    Swelling of lips, face  . Amlodipine Swelling    Leg swelling  . Adhesive [Tape] Itching and  Rash    Please use paper tape  . Latex Itching and Rash    Past Medical History:  Diagnosis Date  . Anxiety   . Breast cancer, left breast ([place]) 2000   Underwent lumpectomy, chemotherapy and radiation  . Facial paralysis/Bells palsy    left side  . Family history of lung cancer   . Family history of lymphoma   . Hypertension   . Neuropathy    IN FINGERS AND TOES   RECENT INFUSION OF CHEMO  . Personal history of breast cancer 05/06/2018  . Renal disorder    just after TIA acute kidney injury  . TIA (transient ischemic attack) 03/16/2017   [facility]    Past Surgical History:  Procedure Laterality Date  . BREAST LUMPECTOMY WITH AXILLARY LYMPH NODE BIOPSY Left 2000   biopsy  . BREAST SURGERY     partial mastectomy with lymph node removal  . MASTECTOMY Right 2019   & partial mastectomy left breast 2000  . MASTECTOMY MODIFIED RADICAL Right 08/26/2018   Procedure: MASTECTOMY MODIFIED RADICAL;  Surgeon: Jovita Kussmaul, MD;  Location: [facility];  Service: General;  Laterality: Right;  . MODIFIED MASTECTOMY Right 08/26/2018  . PORTACATH PLACEMENT Left 04/08/2018   Procedure: INSERTION PORT-A-CATH;  Surgeon: Jovita Kussmaul, MD;  Location: [facility];  Service: General;  Laterality: Left;  . TUBAL LIGATION Bilateral 2004  . WISDOM TOOTH EXTRACTION      Family History  Problem Relation Age of Onset  . Hypertension Mother   . Hypertension Father   . Diverticulosis Father   . Diabetes Father   . Lung cancer Father 14       hx smoking  . Hypertension Maternal Grandmother   . CAD Maternal Grandmother   . Alzheimer's disease Maternal Grandmother        died at 44  . Hypertension Paternal Grandmother   . CAD Paternal Grandmother   . Stroke Paternal Grandfather   . Sickle cell trait Paternal Grandfather   . Pneumonia Maternal Aunt        died at a young adult  . Lymphoma Paternal Aunt 52    Social History   Socioeconomic History  . Marital status: Married    Spouse name:  [name]  . Number of children: 3  . Years of education: 47  . Highest education level: Not on file  Occupational History    Comment: office admin. [employer]  Tobacco Use  . Smoking status: Never Smoker  . Smokeless tobacco: Never Used  Vaping Use  . Vaping Use: Never used  Substance and Sexual Activity  . Alcohol use: No  . Drug use: No  . Sexual activity: Yes    Partners: Male    Birth control/protection: Surgical  Other Topics Concern  . Not on file  Social History Narrative   Lives with family   No caffeine   Social Determinants of Health   Financial Resource Strain:   . Difficulty of Paying Living Expenses: Not on file  Food Insecurity:   . Worried About Charity fundraiser in the Last Year: Not  on file  . Ran Out of Food in the Last Year: Not on file  Transportation Needs:   . Lack of Transportation (Medical): Not on file  . Lack of Transportation (Non-Medical): Not on file  Physical Activity:   . Days of Exercise per Week: Not on file  . Minutes of Exercise per Session: Not on file  Stress:   . Feeling of Stress : Not on file  Social Connections:   . Frequency of Communication with Friends and Family: Not on file  . Frequency of Social Gatherings with Friends and Family: Not on file  . Attends Religious Services: Not on file  . Active Member of Clubs or Organizations: Not on file  . Attends Archivist Meetings: Not on file  . Marital Status: Not on file  Intimate Partner Violence:   . Fear of Current or Ex-Partner: Not on file  . Emotionally Abused: Not on file  . Physically Abused: Not on file  . Sexually Abused: Not on file    Past Medical History, Surgical history, Social history, and Family history were reviewed and updated as appropriate.   Please see review of systems for further details on the patient's review from today.   Review of Systems:  Review of Systems  Constitutional: Positive for appetite change and chills. Negative for  diaphoresis and fever.  HENT: Positive for mouth sores. Negative for trouble swallowing.   Respiratory: Negative for cough, choking, shortness of breath and wheezing.   Cardiovascular: Negative for chest pain and palpitations.  Gastrointestinal: Positive for abdominal pain, diarrhea, nausea and vomiting. Negative for abdominal distention and constipation.  Genitourinary: Negative for decreased urine volume.  Neurological: Negative for headaches.    Objective:   Physical Exam:  BP 126/72   Pulse 82   Temp 98.3 F (36.8 C) (Tympanic)   Resp 18   Ht 4\' 11"  (1.499 m)   Wt 191 lb 14.4 oz (87 kg)   SpO2 100%   BMI 38.76 kg/m  ECOG: 1  Physical Exam Constitutional:      General: She is not in acute distress.    Appearance: She is not diaphoretic.  HENT:     Head: Normocephalic and atraumatic.     Mouth/Throat:     Mouth: Mucous membranes are moist.     Tongue: No lesions.     Pharynx: Oropharynx is clear. No posterior oropharyngeal erythema.   Cardiovascular:     Rate and Rhythm: Normal rate and regular rhythm.     Heart sounds: Normal heart sounds. No murmur heard.  No friction rub. No gallop.   Pulmonary:     Effort: Pulmonary effort is normal. No respiratory distress.     Breath sounds: Normal breath sounds. No wheezing or rales.  Abdominal:     General: Bowel sounds are normal. There is no distension.     Palpations: Abdomen is soft. There is no mass.     Tenderness: There is no abdominal tenderness. There is no guarding or rebound.  Skin:    General: Skin is warm and dry.  Neurological:     General: No focal deficit present.     Mental Status: She is alert.     Motor: No weakness.  Psychiatric:        Mood and Affect: Mood normal.        Behavior: Behavior normal.     Lab Review:     Component Value Date/Time   NA 138 07/31/2020 1400  K 3.5 07/31/2020 1400   CL 103 07/31/2020 1400   CO2 24 07/31/2020 1400   GLUCOSE 107 (H) 07/31/2020 1400   BUN 24 (H)  07/31/2020 1400   CREATININE 1.56 (H) 07/31/2020 1400   CALCIUM 11.9 (H) 07/31/2020 1400   CALCIUM 11.2 (H) 02/24/2020 0830   PROT 8.2 (H) 07/31/2020 1400   ALBUMIN 3.3 (L) 07/31/2020 1400   AST 134 (H) 07/31/2020 1400   ALT 81 (H) 07/31/2020 1400   ALKPHOS 121 07/31/2020 1400   BILITOT 0.9 07/31/2020 1400   GFRNONAA 39 (L) 07/31/2020 1400   GFRAA 45 (L) 07/31/2020 1400       Component Value Date/Time   WBC 5.5 07/31/2020 1400   WBC 4.5 07/20/2020 0914   RBC 4.08 07/31/2020 1400   HGB 10.9 (L) 07/31/2020 1400   HCT 33.8 (L) 07/31/2020 1400   PLT 97 (L) 07/31/2020 1400   MCV 82.8 07/31/2020 1400   MCH 26.7 07/31/2020 1400   MCHC 32.2 07/31/2020 1400   RDW 18.7 (H) 07/31/2020 1400   LYMPHSABS 1.2 07/31/2020 1400   MONOABS 1.1 (H) 07/31/2020 1400   EOSABS 0.1 07/31/2020 1400   BASOSABS 0.0 07/31/2020 1400   -------------------------------  Imaging from last 24 hours (if applicable):  Radiology interpretation: US Abdomen Limited  Result Date: 07/25/2020 CLINICAL DATA:  Right upper quadrant pain EXAM: ULTRASOUND ABDOMEN LIMITED RIGHT UPPER QUADRANT COMPARISON:  None. FINDINGS: Gallbladder: No gallstones or wall thickening visualized. No sonographic Murphy sign noted by sonographer. Common bile duct: Diameter: 4.6 mm Liver: No focal lesion identified. Within normal limits in parenchymal echogenicity. Portal vein is patent on color Doppler imaging with normal direction of blood flow towards the liver. Other: None. IMPRESSION: Normal right upper quadrant ultrasound. Electronically Signed   By: Kathreen Devoid   On: 07/25/2020 15:15   DG Abd 2 Views  Result Date: 07/31/2020 CLINICAL DATA:  Evaluate for small bowel obstruction due to pain and right upper quadrant with nausea/vomiting. EXAM: ABDOMEN - 2 VIEW COMPARISON:  None. FINDINGS: The bowel gas pattern is normal. There is gas within nondilated colon. There is no evidence of free air. No radio-opaque calculi or other significant  radiographic abnormality is seen. IMPRESSION: Nonobstructive bowel gas pattern. Electronically Signed   By: Margaretha Sheffield MD   On: 07/31/2020 13:37   ECHOCARDIOGRAM COMPLETE  Result Date: 07/12/2020    ECHOCARDIOGRAM REPORT   Patient Name:   [name] Celestine Date of Exam: 07/12/2020 Medical Rec #:  [id number]     Height:       59.0 in Accession #:    [id number]    Weight:       199.5 lb Date of Birth:  Sep 25, 1971     BSA:          1.842 m Patient Age:    10 years      BP:           119/85 mmHg Patient Gender: F             HR:           79 bpm. Exam Location:  Inpatient Procedure: 2D Echo Indications:    chemo Z09  History:        Patient has prior history of Echocardiogram examinations, most                 recent 03/08/2020. Risk Factors:Hypertension.  Sonographer:    Mikki Santee RDCS (AE) Referring Phys: [id number] DANIEL R BENSIMHON IMPRESSIONS  1.  Mild intracavitary gradient. Peak velocity 0.98 m/s. Peak gradient 3.8 mmHg. Left ventricular ejection fraction, by estimation, is 65 to 70%. The left ventricle has normal function. The left ventricle has no regional wall motion abnormalities. There is mild concentric left ventricular hypertrophy. Left ventricular diastolic parameters are indeterminate. The average left ventricular global longitudinal strain is -23.7 %. The global longitudinal strain is normal.  2. Right ventricular systolic function is normal. The right ventricular size is normal. There is normal pulmonary artery systolic pressure.  3. Left atrial size was moderately dilated.  4. The mitral valve is normal in structure. Trivial mitral valve regurgitation. No evidence of mitral stenosis.  5. The aortic valve is tricuspid. Aortic valve regurgitation is not visualized. No aortic stenosis is present.  6. The inferior vena cava is normal in size with greater than 50% respiratory variability, suggesting right atrial pressure of 3 mmHg. FINDINGS  Left Ventricle: Mild intracavitary gradient. Peak velocity  0.98 m/s. Peak gradient 3.8 mmHg. Left ventricular ejection fraction, by estimation, is 65 to 70%. The left ventricle has normal function. The left ventricle has no regional wall motion abnormalities. The average left ventricular global longitudinal strain is -23.7 %. The global longitudinal strain is normal. The left ventricular internal cavity size was normal in size. There is mild concentric left ventricular hypertrophy. Left ventricular diastolic parameters are indeterminate. Right Ventricle: The right ventricular size is normal. No increase in right ventricular wall thickness. Right ventricular systolic function is normal. There is normal pulmonary artery systolic pressure. The tricuspid regurgitant velocity is 2.29 m/s, and  with an assumed right atrial pressure of 3 mmHg, the estimated right ventricular systolic pressure is 19.6 mmHg. Left Atrium: Left atrial size was moderately dilated. Right Atrium: Right atrial size was normal in size. Pericardium: There is no evidence of pericardial effusion. Mitral Valve: The mitral valve is normal in structure. Normal mobility of the mitral valve leaflets. Trivial mitral valve regurgitation. No evidence of mitral valve stenosis. Tricuspid Valve: The tricuspid valve is normal in structure. Tricuspid valve regurgitation is trivial. No evidence of tricuspid stenosis. Aortic Valve: The aortic valve is tricuspid. Aortic valve regurgitation is not visualized. No aortic stenosis is present. Pulmonic Valve: The pulmonic valve was normal in structure. Pulmonic valve regurgitation is trivial. No evidence of pulmonic stenosis. Aorta: The aortic root is normal in size and structure. Venous: The inferior vena cava is normal in size with greater than 50% respiratory variability, suggesting right atrial pressure of 3 mmHg. IAS/Shunts: No atrial level shunt detected by color flow Doppler.  LEFT VENTRICLE PLAX 2D LVIDd:         4.30 cm  Diastology LVIDs:         2.80 cm  LV e' lateral:    5.87 cm/s LV PW:         1.30 cm  LV E/e' lateral: 13.9 LV IVS:        1.30 cm  LV e' medial:    7.83 cm/s LVOT diam:     2.00 cm  LV E/e' medial:  10.4 LV SV:         85 LV SV Index:   46       2D Longitudinal Strain LVOT Area:     3.14 cm 2D Strain GLS (A2C):   -24.9 %                         2D Strain GLS (A3C):   -19.7 %  2D Strain GLS (A4C):   -26.5 %                         2D Strain GLS Avg:     -23.7 % RIGHT VENTRICLE RV S prime:     16.90 cm/s TAPSE (M-mode): 2.2 cm LEFT ATRIUM             Index       RIGHT ATRIUM           Index LA diam:        3.90 cm 2.12 cm/m  RA Area:     15.90 cm LA Vol (A2C):   45.7 ml 24.80 ml/m RA Volume:   39.70 ml  21.55 ml/m LA Vol (A4C):   73.2 ml 39.73 ml/m LA Biplane Vol: 58.5 ml 31.75 ml/m  AORTIC VALVE LVOT Vmax:   130.00 cm/s LVOT Vmean:  95.300 cm/s LVOT VTI:    0.269 m  AORTA Ao Root diam: 3.00 cm MITRAL VALVE               TRICUSPID VALVE MV Area (PHT): 3.06 cm    TR Peak grad:   21.0 mmHg MV Decel Time: 248 msec    TR Vmax:        229.00 cm/s MV E velocity: 81.40 cm/s MV A velocity: 67.90 cm/s  SHUNTS MV E/A ratio:  1.20        Systemic VTI:  0.27 m                            Systemic Diam: 2.00 cm Skeet Latch MD Electronically signed by Skeet Latch MD Signature Date/Time: 07/12/2020/9:20:03 AM    Final

## 2020-08-07 ENCOUNTER — Other Ambulatory Visit: Payer: Self-pay | Admitting: Oncology

## 2020-08-10 ENCOUNTER — Other Ambulatory Visit: Payer: Self-pay

## 2020-08-10 ENCOUNTER — Inpatient Hospital Stay: Payer: BC Managed Care – PPO | Attending: Oncology

## 2020-08-10 ENCOUNTER — Other Ambulatory Visit: Payer: Self-pay | Admitting: Hematology and Oncology

## 2020-08-10 ENCOUNTER — Inpatient Hospital Stay: Payer: BC Managed Care – PPO

## 2020-08-10 VITALS — BP 104/65 | HR 74 | Temp 98.4°F | Resp 18 | Wt 188.1 lb

## 2020-08-10 DIAGNOSIS — Z79899 Other long term (current) drug therapy: Secondary | ICD-10-CM | POA: Insufficient documentation

## 2020-08-10 DIAGNOSIS — Z170 Estrogen receptor positive status [ER+]: Secondary | ICD-10-CM

## 2020-08-10 DIAGNOSIS — C50411 Malignant neoplasm of upper-outer quadrant of right female breast: Secondary | ICD-10-CM

## 2020-08-10 DIAGNOSIS — Z5112 Encounter for antineoplastic immunotherapy: Secondary | ICD-10-CM | POA: Insufficient documentation

## 2020-08-10 DIAGNOSIS — Z95828 Presence of other vascular implants and grafts: Secondary | ICD-10-CM

## 2020-08-10 DIAGNOSIS — C7951 Secondary malignant neoplasm of bone: Secondary | ICD-10-CM | POA: Diagnosis not present

## 2020-08-10 LAB — CBC WITH DIFFERENTIAL/PLATELET
Abs Immature Granulocytes: 0.01 10*3/uL (ref 0.00–0.07)
Basophils Absolute: 0 10*3/uL (ref 0.0–0.1)
Basophils Relative: 1 %
Eosinophils Absolute: 0.1 10*3/uL (ref 0.0–0.5)
Eosinophils Relative: 2 %
HCT: 33.2 % — ABNORMAL LOW (ref 36.0–46.0)
Hemoglobin: 10.7 g/dL — ABNORMAL LOW (ref 12.0–15.0)
Immature Granulocytes: 0 %
Lymphocytes Relative: 24 %
Lymphs Abs: 1.1 10*3/uL (ref 0.7–4.0)
MCH: 26.5 pg (ref 26.0–34.0)
MCHC: 32.2 g/dL (ref 30.0–36.0)
MCV: 82.2 fL (ref 80.0–100.0)
Monocytes Absolute: 0.7 10*3/uL (ref 0.1–1.0)
Monocytes Relative: 14 %
Neutro Abs: 2.8 10*3/uL (ref 1.7–7.7)
Neutrophils Relative %: 59 %
Platelets: 84 10*3/uL — ABNORMAL LOW (ref 150–400)
RBC: 4.04 MIL/uL (ref 3.87–5.11)
RDW: 18.5 % — ABNORMAL HIGH (ref 11.5–15.5)
WBC: 4.7 10*3/uL (ref 4.0–10.5)
nRBC: 0 % (ref 0.0–0.2)

## 2020-08-10 LAB — COMPREHENSIVE METABOLIC PANEL
ALT: 67 U/L — ABNORMAL HIGH (ref 0–44)
AST: 117 U/L — ABNORMAL HIGH (ref 15–41)
Albumin: 3.2 g/dL — ABNORMAL LOW (ref 3.5–5.0)
Alkaline Phosphatase: 104 U/L (ref 38–126)
Anion gap: 7 (ref 5–15)
BUN: 27 mg/dL — ABNORMAL HIGH (ref 6–20)
CO2: 24 mmol/L (ref 22–32)
Calcium: 11.4 mg/dL — ABNORMAL HIGH (ref 8.9–10.3)
Chloride: 107 mmol/L (ref 98–111)
Creatinine, Ser: 1.49 mg/dL — ABNORMAL HIGH (ref 0.44–1.00)
GFR calc Af Amer: 48 mL/min — ABNORMAL LOW (ref >60)
GFR calc non Af Amer: 41 mL/min — ABNORMAL LOW (ref >60)
Glucose, Bld: 110 mg/dL — ABNORMAL HIGH (ref 70–99)
Potassium: 3.7 mmol/L (ref 3.5–5.1)
Sodium: 138 mmol/L (ref 135–145)
Total Bilirubin: 0.8 mg/dL (ref 0.3–1.2)
Total Protein: 8.4 g/dL — ABNORMAL HIGH (ref 6.5–8.1)

## 2020-08-10 MED ORDER — DIPHENHYDRAMINE HCL 25 MG PO CAPS
ORAL_CAPSULE | ORAL | Status: AC
  Filled 2020-08-10: qty 1

## 2020-08-10 MED ORDER — SODIUM CHLORIDE 0.9% FLUSH
10.0000 mL | Freq: Once | INTRAVENOUS | Status: AC
  Administered 2020-08-10: 10 mL
  Filled 2020-08-10: qty 10

## 2020-08-10 MED ORDER — SODIUM CHLORIDE 0.9 % IV SOLN
3.4000 mg/kg | Freq: Once | INTRAVENOUS | Status: AC
  Administered 2020-08-10: 300 mg via INTRAVENOUS
  Filled 2020-08-10: qty 15

## 2020-08-10 MED ORDER — DIPHENHYDRAMINE HCL 25 MG PO CAPS
25.0000 mg | ORAL_CAPSULE | Freq: Once | ORAL | Status: AC
  Administered 2020-08-10: 25 mg via ORAL

## 2020-08-10 MED ORDER — SODIUM CHLORIDE 0.9% FLUSH
10.0000 mL | INTRAVENOUS | Status: DC | PRN
  Filled 2020-08-10: qty 10

## 2020-08-10 MED ORDER — SODIUM CHLORIDE 0.9 % IV SOLN
Freq: Once | INTRAVENOUS | Status: AC
  Filled 2020-08-10: qty 250

## 2020-08-10 MED ORDER — ACETAMINOPHEN 325 MG PO TABS
ORAL_TABLET | ORAL | Status: AC
  Filled 2020-08-10: qty 2

## 2020-08-10 MED ORDER — HEPARIN SOD (PORK) LOCK FLUSH 100 UNIT/ML IV SOLN
500.0000 [IU] | Freq: Once | INTRAVENOUS | Status: DC | PRN
  Filled 2020-08-10: qty 5

## 2020-08-10 MED ORDER — ACETAMINOPHEN 325 MG PO TABS
650.0000 mg | ORAL_TABLET | Freq: Once | ORAL | Status: AC
  Administered 2020-08-10: 650 mg via ORAL

## 2020-08-10 NOTE — Progress Notes (Signed)
OK to treat with platelets 84 K per Dr Lindi Adie

## 2020-08-10 NOTE — Progress Notes (Signed)
Ok to treat today per Dr. Lindi Adie with AST of 117

## 2020-08-12 ENCOUNTER — Other Ambulatory Visit: Payer: Self-pay | Admitting: Adult Health

## 2020-08-12 DIAGNOSIS — C50411 Malignant neoplasm of upper-outer quadrant of right female breast: Secondary | ICD-10-CM

## 2020-08-12 DIAGNOSIS — R1114 Bilious vomiting: Secondary | ICD-10-CM

## 2020-08-12 DIAGNOSIS — Z170 Estrogen receptor positive status [ER+]: Secondary | ICD-10-CM

## 2020-08-24 ENCOUNTER — Other Ambulatory Visit: Payer: Self-pay

## 2020-08-24 ENCOUNTER — Ambulatory Visit (HOSPITAL_COMMUNITY)
Admission: RE | Admit: 2020-08-24 | Discharge: 2020-08-24 | Disposition: A | Discharge status: Home / Self Care | Payer: BC Managed Care – PPO | Source: Ambulatory Visit | Attending: Oncology | Admitting: Oncology

## 2020-08-24 DIAGNOSIS — Z9011 Acquired absence of right breast and nipple: Secondary | ICD-10-CM | POA: Diagnosis not present

## 2020-08-24 DIAGNOSIS — C7951 Secondary malignant neoplasm of bone: Secondary | ICD-10-CM | POA: Insufficient documentation

## 2020-08-24 DIAGNOSIS — C50919 Malignant neoplasm of unspecified site of unspecified female breast: Secondary | ICD-10-CM | POA: Diagnosis not present

## 2020-08-24 DIAGNOSIS — C50411 Malignant neoplasm of upper-outer quadrant of right female breast: Secondary | ICD-10-CM | POA: Insufficient documentation

## 2020-08-24 DIAGNOSIS — Z7189 Other specified counseling: Secondary | ICD-10-CM | POA: Diagnosis not present

## 2020-08-24 DIAGNOSIS — Z9851 Tubal ligation status: Secondary | ICD-10-CM | POA: Diagnosis not present

## 2020-08-24 DIAGNOSIS — Z6841 Body Mass Index (BMI) 40.0 and over, adult: Secondary | ICD-10-CM | POA: Insufficient documentation

## 2020-08-24 DIAGNOSIS — Z170 Estrogen receptor positive status [ER+]: Secondary | ICD-10-CM | POA: Diagnosis not present

## 2020-08-24 LAB — GLUCOSE, CAPILLARY: Glucose-Capillary: 91 mg/dL (ref 70–99)

## 2020-08-24 MED ORDER — FLUDEOXYGLUCOSE F - 18 (FDG) INJECTION
9.3300 | Freq: Once | INTRAVENOUS | Status: AC
  Administered 2020-08-24: 9.33 via INTRAVENOUS

## 2020-08-28 ENCOUNTER — Other Ambulatory Visit: Payer: Self-pay | Admitting: Oncology

## 2020-08-28 DIAGNOSIS — Z170 Estrogen receptor positive status [ER+]: Secondary | ICD-10-CM

## 2020-08-28 DIAGNOSIS — C50411 Malignant neoplasm of upper-outer quadrant of right female breast: Secondary | ICD-10-CM

## 2020-08-28 NOTE — Progress Notes (Unsigned)
I have reviewed the PET scan and asked pulmonology if they think it is possible to obtain a bronc biopsy of the very small very hard nodule in the right hilar area.

## 2020-08-29 NOTE — Progress Notes (Signed)
[place]  [phone] [phone]    ID: [name] [name] DOB: Dec 27, 1970  MR#: [id number]  DVV#:[id number]  Patient Care Team: Marda Stalker, PA-C as PCP - General (Family Medicine) Jerline Pain, MD as PCP - Cardiology (Cardiology) Kyo Cocuzza, Virgie Dad, MD as Consulting Physician (Oncology) Loreta Ave, MD as Referring Physician (Internal Medicine) Donato Heinz, MD as Consulting Physician (Nephrology) Kyung Rudd, MD as Consulting Physician (Radiation Oncology) Jovita Kussmaul, MD as Consulting Physician (General Surgery) Larey Dresser, MD as Consulting Physician (Cardiology) Mauro Kaufmann, RN as Oncology Nurse Navigator Rockwell Germany, RN as Oncology Nurse Navigator OTHER MD:   CHIEF COMPLAINT: Triple positive breast cancer (s/p right mastectomy)  CURRENT TREATMENT: T-DM1; anastrozole; Xgeva (Q 6 w)   INTERVAL HISTORY: [name] returns today for follow-up and treatment of her triple positive breast cancer.   Since her last visit, she underwent restaging PET scan on 08/24/2020 showing: new area of increased activity in right hilum with maximum SUV of 10.1; similar appearance of sclerotic foci within sternal manubrium, thoracic spine, and pelvis; presumably post-treatment changes to right sternum and chest wall; no activity with focal characteristics associated with sclerotic lesions elsewhere; stable appearance of subcutaneous nodule along soft tissue of left shoulder without associated increased metabolic activity.  She also underwent abdomen ultrasound on 07/25/2020 and abdomen x-ray on 07/31/2020, both of which were normal.  She continues on TDM-1 given every three weeks.  She is tolerating this well with the exception of thrombocytopenia.  There are no readings under 50,000 however  She also continues on anastrozole.  She has no unusual side effects from this other than mild vaginal dryness which also of course is due to  menopause  She last received denosumab/Xgeva on 07/20/2020.  We are doing this every 6 weeks.  She has no side effects from this that she is aware of.  Shonya's most recent echocardiogram was completed on 07/12/2020 and showed an EF of 65-70%.   REVIEW OF SYSTEMS: [name] is understandably concerned about the recent scan.  We went over it in detail today this is discussed further below.  Clinically however she is very stable.  She enjoys family, is taking appropriate pandemic precautions, and continues to work although she is considering going on disability at this point.  There have been no unusual headaches visual changes nausea vomiting cough phlegm production pleurisy or change in bowel or bladder habits.  Detailed review of systems today was otherwise stable.   HISTORY OF CURRENT ILLNESS: From the original intake note:  [name] Alarid palpated a lump on 01/15/2018. She felt soreness and shooting pain under her arm and in the right breast. She underwent bilateral diagnostic mammography with tomography and right breast ultrasonography at [facility] on 03/12/2018 showing: breast density category C. The is architectural distortion at the 11 o'clock position. There is also an oval mass in the right breast anterior depth. Examination of the right axilla showed enlarged lymph nodes. Ultrasonography showed a 4.6 cm mass in the right breast upper outer quadrant posterior depth. An additional 1.2 cm oval mass in the right breast 12 o'clock middle depth. The lymph nodes in the right axillary are highly suggestive of malignancy.   Accordingly on 03/16/2018 she proceeded to biopsy of the right breast area in question. The pathology from this procedure showed ([id number]): At both the 11 and 12 o'clock positions: Invasive ductal carcinoma grade II. Ductal carcinoma in situ, high grade, with necrosis and calcifications. Prognostic indicators significant for:  estrogen receptor, 90% positive and progesterone receptor, 40%  positive, both with strong staining intensity. Proliferation marker Ki67 at 80%. HER2 amplified with ratios HER2/CEP17 SIGNALS 6.90 and average HER2 copies per cell 14.50  The patient's subsequent history is as detailed below.   PAST MEDICAL HISTORY: Past Medical History:  Diagnosis Date  . Anxiety   . Breast cancer, left breast ([place]) 2000   Underwent lumpectomy, chemotherapy and radiation  . Facial paralysis/Bells palsy    left side  . Family history of lung cancer   . Family history of lymphoma   . Hypertension   . Neuropathy    IN FINGERS AND TOES   RECENT INFUSION OF CHEMO  . Personal history of breast cancer 05/06/2018  . Renal disorder    just after TIA acute kidney injury  . TIA (transient ischemic attack) 03/16/2017   [facility]   The patient has a previous history of left breast cancer diagnosed in 2000. She was seen by [facility] in [place], [place] under Dr. Loreta Ave. According to the patient, her left breast cancer was stage II with no lymph node involvement (likely T2N0). She had chemotherapy every 3 weeks for about 6 months. She did not takes any "red drugs." She also did not take anti-estrogens (likely ER/PR negative).   TIA in 2018. She saw a neurologist as a precaution. She denies any clotting issues.    PAST SURGICAL HISTORY: Past Surgical History:  Procedure Laterality Date  . BREAST LUMPECTOMY WITH AXILLARY LYMPH NODE BIOPSY Left 2000   biopsy  . BREAST SURGERY     partial mastectomy with lymph node removal  . MASTECTOMY Right 2019   & partial mastectomy left breast 2000  . MASTECTOMY MODIFIED RADICAL Right 08/26/2018   Procedure: MASTECTOMY MODIFIED RADICAL;  Surgeon: Jovita Kussmaul, MD;  Location: [place];  Service: General;  Laterality: Right;  . MODIFIED MASTECTOMY Right 08/26/2018  . PORTACATH PLACEMENT Left 04/08/2018   Procedure: INSERTION PORT-A-CATH;  Surgeon: Jovita Kussmaul, MD;  Location: [place];  Service: General;  Laterality:  Left;  . TUBAL LIGATION Bilateral 2004  . WISDOM TOOTH EXTRACTION      FAMILY HISTORY Family History  Problem Relation Age of Onset  . Hypertension Mother   . Hypertension Father   . Diverticulosis Father   . Diabetes Father   . Lung cancer Father 43       hx smoking  . Hypertension Maternal Grandmother   . CAD Maternal Grandmother   . Alzheimer's disease Maternal Grandmother        died at 54  . Hypertension Paternal Grandmother   . CAD Paternal Grandmother   . Stroke Paternal Grandfather   . Sickle cell trait Paternal Grandfather   . Pneumonia Maternal Aunt        died at a young adult  . Lymphoma Paternal Aunt 19   The patient's father is alive at age 80 and had a history of lung cancer. The patient's mother is alive at age 71. The patient has 1 brother and no sisters. She denies a family history of breast or ovarian cancer.     GYNECOLOGIC HISTORY:  Patient's last menstrual period was 12/21/2017. Menarche: 49 years old Age at first live birth: 49 years old She is GXP3. She is no longer having periods, which were irregular and light. Her LMP was  January 2019. She is not having hot flashes. The patient used oral contraceptive from 1991-1993 and the Depo-provera shot from  3299-2426 with no complications. She never used HRT.    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  She is now working at [employer] as a Occupational psychologist.  Her husband, [name], is a Administrator. The patient's son, Shea Stakes age 50, works at [employer] in [place]. The patient's daughters, Lilia Pro age 32, and Levonne Spiller age 48, are students. The patient's adopted son, Vonna Kotyk age 4, is also a Ship broker.    ADVANCED DIRECTIVES: In the absence of any documents to the contrary the patient's husband is her healthcare power of attorney   HEALTH MAINTENANCE: Social History   Tobacco Use  . Smoking status: Never Smoker  . Smokeless tobacco: Never Used  Vaping Use  . Vaping Use:  Never used  Substance Use Topics  . Alcohol use: No  . Drug use: No     Colonoscopy: n/a  PAP: 2015  Bone density: never done   Allergies  Allergen Reactions  . Ace Inhibitors Swelling    Swelling of lips and face  . Lisinopril Swelling    Swelling of lips, face  . Amlodipine Swelling    Leg swelling  . Adhesive [Tape] Itching and Rash    Please use paper tape  . Latex Itching and Rash    Current Outpatient Medications  Medication Sig Dispense Refill  . acyclovir (ZOVIRAX) 400 MG tablet TAKE 1 TABLET BY MOUTH TWICE A DAY 60 tablet 1  . anastrozole (ARIMIDEX) 1 MG tablet Take 1 tablet (1 mg total) by mouth daily. 90 tablet 4  . Ascorbic Acid (VITAMIN C) 1000 MG tablet Take 1,000 mg by mouth daily.    Marland Kitchen aspirin EC 81 MG tablet Take 81 mg by mouth daily.    . carvedilol (COREG) 3.125 MG tablet TAKE 1 TABLET (3.125 MG TOTAL) BY MOUTH 2 (TWO) TIMES DAILY WITH A MEAL. 180 tablet 2  . Cholecalciferol (VITAMIN D3 PO) Take by mouth daily.    . Cyanocobalamin (VITAMIN B12) 500 MCG TABS Take 500 mcg by mouth daily.     . diphenoxylate-atropine (LOMOTIL) 2.5-0.025 MG tablet 1 to 2 tablets 4 times daily as needed for diarrhea 30 tablet 2  . ferrous sulfate 325 (65 FE) MG tablet Take 1 tablet (325 mg total) by mouth daily. 30 tablet 0  . lidocaine (XYLOCAINE) 2 % solution Use as directed 5 mLs in the mouth or throat as needed for mouth pain. Swish and spit 200 mL 1  . lidocaine-prilocaine (EMLA) cream Apply 1 application topically as needed.    Marland Kitchen losartan (COZAAR) 50 MG tablet Take 1 tablet (50 mg total) by mouth daily. 30 tablet 11  . magic mouthwash w/lidocaine SOLN Take 5 mLs by mouth 4 (four) times daily as needed for mouth pain. 240 mL 3  . Multiple Vitamin (MULITIVITAMIN WITH MINERALS) TABS Take 1 tablet by mouth daily.      . ondansetron (ZOFRAN ODT) 8 MG disintegrating tablet Take 1 tablet (8 mg total) by mouth every 8 (eight) hours as needed for nausea or vomiting. 20 tablet 2  .  prochlorperazine (COMPAZINE) 10 MG tablet Take 1 tablet (10 mg total) by mouth every 6 (six) hours as needed (Nausea or vomiting). 60 tablet 1  . spironolactone-hydrochlorothiazide (ALDACTAZIDE) 25-25 MG tablet TAKE 1 TABLET BY MOUTH EVERY DAY 30 tablet 2  . venlafaxine XR (EFFEXOR-XR) 37.5 MG 24 hr capsule TAKE 1 CAPSULE BY MOUTH DAILY WITH BREAKFAST. 90 capsule 4  . zinc gluconate 50 MG tablet  Take 50 mg by mouth daily.     No current facility-administered medications for this visit.    OBJECTIVE:  African-American woman who appears stated age  88:   08/30/20 0825  BP: 102/65  Pulse: 66  Resp: 18  Temp: 98.4 F (36.9 C)  SpO2: 95%   Wt Readings from Last 3 Encounters:  08/30/20 189 lb 14.4 oz (86.1 kg)  08/10/20 188 lb 1 oz (85.3 kg)  07/31/20 191 lb 14.4 oz (87 kg)   Body mass index is 38.36 kg/m.    ECOG FS:1 - Symptomatic but completely ambulatory  Sclerae unicteric, EOMs intact Wearing a mask No cervical or supraclavicular adenopathy Lungs no rales or rhonchi Heart regular rate and rhythm Abd soft, nontender, positive bowel sounds MSK no focal spinal tenderness, no upper extremity lymphedema Neuro: nonfocal, well oriented, appropriate affect Breasts: Status post right mastectomy with no evidence of chest wall recurrence.  LAB RESULTS:  CMP     Component Value Date/Time   NA 133 (L) 08/30/2020 0808   K 4.0 08/30/2020 0808   CL 104 08/30/2020 0808   CO2 24 08/30/2020 0808   GLUCOSE 137 (H) 08/30/2020 0808   BUN 29 (H) 08/30/2020 0808   CREATININE 1.70 (H) 08/30/2020 0808   CREATININE 1.56 (H) 07/31/2020 1400   CALCIUM 10.6 (H) 08/30/2020 0808   CALCIUM 11.2 (H) 02/24/2020 0830   PROT 7.6 08/30/2020 0808   ALBUMIN 3.0 (L) 08/30/2020 0808   AST 92 (H) 08/30/2020 0808   AST 134 (H) 07/31/2020 1400   ALT 61 (H) 08/30/2020 0808   ALT 81 (H) 07/31/2020 1400   ALKPHOS 107 08/30/2020 0808   BILITOT 0.8 08/30/2020 0808   BILITOT 0.9 07/31/2020 1400    GFRNONAA 35 (L) 08/30/2020 0808   GFRNONAA 39 (L) 07/31/2020 1400   GFRAA 41 (L) 08/30/2020 0808   GFRAA 45 (L) 07/31/2020 1400    Lab Results  Component Value Date   TOTALPROTELP 7.0 02/25/2019     Lab Results  Component Value Date   KPAFRELGTCHN 76.7 (H) 02/25/2019   LAMBDASER 27.2 (H) 02/25/2019   KAPLAMBRATIO 2.82 (H) 02/25/2019    Lab Results  Component Value Date   WBC 4.9 08/30/2020   NEUTROABS 2.9 08/30/2020   HGB 9.2 (L) 08/30/2020   HCT 28.1 (L) 08/30/2020   MCV 82.9 08/30/2020   PLT 68 (L) 08/30/2020   No results found for: LABCA2  No components found for: [id number]  No results for input(s): INR in the last 168 hours.  No results found for: LABCA2  No results found for: HAL937  No results found for: TKW409  No results found for: BDZ329  Lab Results  Component Value Date   CA2729 38.7 (H) 02/25/2019    No components found for: HGQUANT  No results found for: CEA1 / No results found for: CEA1   No results found for: AFPTUMOR  No results found for: CHROMOGRNA  No results found for: HGBA, HGBA2QUANT, HGBFQUANT, HGBSQUAN (Hemoglobinopathy evaluation)   No results found for: LDH  No results found for: IRON, TIBC, IRONPCTSAT (Iron and TIBC)  No results found for: FERRITIN  Urinalysis    Component Value Date/Time   COLORURINE YELLOW 08/18/2019 [place] 08/18/2019 1420   LABSPEC 1.016 08/18/2019 1420   PHURINE 5.0 08/18/2019 [place] 08/18/2019 [place] 08/18/2019 [place] 08/18/2019 [place] 08/18/2019 [place] 08/18/2019 1420  UROBILINOGEN 0.2 11/29/2011 1656   NITRITE NEGATIVE 08/18/2019 1420   LEUKOCYTESUR SMALL (A) 08/18/2019 1420     STUDIES: NM PET Image Restag (PS) Skull Base To Thigh  Result Date: 08/24/2020 CLINICAL DATA:  Subsequent treatment strategy for breast cancer, assess treatment response. EXAM: NUCLEAR MEDICINE  PET SKULL BASE TO THIGH TECHNIQUE: 9.33 mCi F-18 FDG was injected intravenously. Full-ring PET imaging was performed from the skull base to thigh after the radiotracer. CT data was obtained and used for attenuation correction and anatomic localization. Fasting blood glucose: 91 mg/dl COMPARISON:  March 08, 2020 FINDINGS: Mediastinal blood pool activity: SUV max Liver activity: SUV max NA NECK: No hypermetabolic lymph nodes in the neck. Incidental CT findings: none CHEST: Mild increased metabolic activity associated with the RIGHT sternal manubrium and parasternal region with a maximum SUV of 3.71, on 10/26/2019 this area showed a maximum SUV of 4.1 and when measured on the study of April of 2021 at 3.5. Sclerosis in the area of the sternal manubrium is similar to the previous exam Uptake associated with the RIGHT chest wall with an area of soft tissue measuring approximately 3.2 cm on image 71 of series 4 is associated with maximum SUV of 3.6 as compared to 4.3 on the prior study. RIGHT retrocrural lymph node measuring 8 mm displays less than background mediastinal blood pool activity. Intensely hypermetabolic focus is seen about the RIGHT hilum (SUVmax = 10.2 (image 67 of series 4, likely a small lymph node along the infrahilar region of the RIGHT chest, perhaps approximately 6 mm, not present on the previous exam only mild uptake in this area on 10/26/2019) Incidental CT findings: No atherosclerotic changes in the thoracic aorta. Heart size is stable, top-normal. No pericardial effusion. Central pulmonary vasculature is unremarkable on noncontrast imaging. Limited assessment of cardiovascular structures given lack of intravenous contrast. Mild subpleural scarring seen along the anterior chest following previous radiotherapy to the RIGHT chest wall. Post RIGHT mastectomy as before as above. Post LEFT lumpectomy and axillary dissection. Axillary dissection changes seen bilaterally similar to the prior study.  ABDOMEN/PELVIS: No abnormal hypermetabolic activity within the liver, pancreas, adrenal glands, or spleen. No hypermetabolic lymph nodes in the abdomen or pelvis. Incidental CT findings: No pericholecystic stranding. No gross biliary duct dilation. No peripancreatic stranding. Normal appearance of the spleen which is normal size. Adrenal glands and kidneys without acute process. No nephrolithiasis. No acute gastrointestinal process. Normal appendix. Normal appearance of abdominal aorta. No atherosclerotic calcification. Tubal ligation clip in the RIGHT adnexa. CT appearance of uterus and adnexa otherwise unremarkable. SKELETON: Sclerotic areas are similar to the prior study in the RIGHT sternal manubrium and in the thoracic spine. No destructive bone process. Similar appearance of sclerosis in the RIGHT ischium. Low level uptake about the RIGHT sternoclavicular joint and associated soft tissue stranding without change since the previous study with diminished uptake since the study of November of 2020. Incidental CT findings: none IMPRESSION: 1. New area of increased activity in the RIGHT hilum with maximum SUV of 10.1 compatible with disease recurrence in this location. 2. Sclerotic foci within the sternal manubrium, thoracic spine and pelvis with similar appearance. Low level uptake is seen about the RIGHT sternum and RIGHT chest wall presumably related to post treatment changes with diminished uptake since November of 2020 within the area of the sternum and in the area of the RIGHT chest wall, attention on follow-up. 3. No activity with focal characteristics associated with the sclerotic lesions elsewhere. 4. Stable appearance  of subcutaneous nodule along the soft tissues about the LEFT lateral shoulder measuring approximately 8 mm and without associated increased metabolic activity above mediastinal blood pool. Attention on follow-up. Electronically Signed   By: Zetta Bills M.D.   On: 08/24/2020 12:58     ELIGIBLE FOR AVAILABLE RESEARCH PROTOCOL: no   ASSESSMENT: 49 y.o. [place], [place] woman  (1) status post left lumpectomy in 2000 for a (?) T2N0 breast cancer,  (a) status post adjuvant chemotherapy  (b) status post adjuvant radiation  (c) did not receive antiestrogens  (2) genetics testing May 18, 2018 through the common Hereditary Cancers Panel + Myelodysplastic Syndrome/Leukemia Panel found no deleterious mutations in APC, ATM, AXIN2, BARD1, BLM, BMPR1A, BRCA1, BRCA2, BRIP1, CDH1, CDK4, CDKN2A (p14ARF), CDKN2A (p16INK4a), CEBPA, CHEK2, CTNNA1, DICER1, EPCAM*, GATA2, GREM1*, HRAS, KIT, MEN1, MLH1, MSH2, MSH3, MSH6, MUTYH, NBN, NF1, PALB2, PDGFRA, PMS2, POLD1, POLE, PTEN, RAD50, RAD51C, RAD51D, RUNX1, SDHB, SDHC, SDHD, SMAD4, SMARCA4, STK11, TERC, TERT, TP53, TSC1, TSC2, VHL The following genes were evaluated for sequence changes only: HOXB13*, NTHL1*, SDHA  (a)  3 variants of uncertain significance were identified in the genes BARD1 c.764A>G (p.Asn255Ser), BRIP1 c.2563C>T (p.Arg855Cys), and PALB2 c.3103A>T (p.Ile1035Phe).   (3) status post right breast biopsy 03/16/2018 for a clinical mT2-3 N2, anatomic stage III invasive ductal carcinoma, grade 2, triple positive, with an MIB-1 of 80%.  (a) bone scan and chest CT scan negative for metastases except for a T6 lytic lesion of concern  (b) thoracic spine MRI was ordered May 2019 but never performed.  (4) neoadjuvant chemotherapy consisting of carboplatin, docetaxel, trastuzumab and Pertuzumab every 3 weeks x 6 starting 04/16/2018  (a) pertuzumab held beginning with cycle 2 because of diarrhea  (5) trastuzumab to be continued Q4w indefinitely, discontinued after 10/21/2019 dose with progression  (a) echocardiogram on 06/29/2018 showed an ejection fraction in the 65-70% range  (b) echocardiogram 11/19/2018 showed an ejection fraction in the 60-65% range  (c) echo 04/07/2019 shows an ejection fraction in the 55-60% range  (d) echo 07/15/2019  shows EF of 60-65%  (6) right mastectomy and sentinel lymph node sampling on 08/26/2018 found a residual 2.5cm invasive ductal carcinoma, with 2 of 5 sentinel lymph nodes positive, ypT2, ypN1a; margins were clear  (7) adjuvant radiation 09/30/2018 - 11/16/2018 Site/dose:   The patient initially received a dose of 50.4 Gy in 28 fractions to the right chest wall and supraclavicular region. This was delivered using a 3-D conformal, 4 field technique. The patient then received a boost to the mastectomy scar. This delivered an additional 10 Gy in 5 fractions using an en face electron field. The total dose was 60.4   (8) anastrozole started 12/03/2018  (a) [place] on 10/01/2018 was 64.3, and estradiol 7.0, consistent with menopause  (b) referral to pelvic floor rehabilitation 12/03/2018  (c) [place] on 03/16/2020 was 80.8 with estradiol less than 2.5   METASTATIC DISEASE: March 2020 (9) CT of neck and CT angiography of the chest 02/04/2019 finds the T6 lytic lesion noted May 2019 (see #3 above) is now sclerotic; a sclerotic T3 lesion is stable; there is a new lytic lesion in the manubrium  (a) PET scan 03/12/2019 shows manubrium, T3 and T5 metastases, no visceral disease  (b) biopsy of manubrial lesion planned but not done secondary to pandemic  (c) Bone scan 09/01/19 shows ? progression of disease in spine; MRI 09/02/19 shows early metastases in T10 (new), T3 and T5 stable; CT chest 09/01/19 shows 69m pulmonary nodules that are new,  but non specific and warrant future follow up  (d) PET 10/26/2019 shows no lung or liver lesions; new mediastinal adenopathy, bone lesions  (10) SRS to spine, palliative treatment to sternum, from 05/13/2019 through 05/26/2019:  1. Chest_sternum // 40 Gy in 10 fractions  2. Thoracic Spine (T3) // 18 Gy in 1 fraction   3. Thoracic Spine (T5) // 18 Gy in 1 fraction  (11) denosumab/Xgeva started 09/08/2019  (12) T-DM1 started 01/15/20201  (a) echo 10/26/2019 shows stable EF at  55-60%  (b) echo 03/08/2020 shows an ejection fraction in the 70-75% range  (c) echo 07/12/2020 shows an ejection fraction in the 65 to 70% range.  (d) PET scan 03/08/2020 shows no active disease; a 0.9 cm subcutaneous nodule in the left shoulder area seen on the PET was not identifiable by exam 03/16/2020  (e) PET 08/24/2020 shows stable disease except for 0.6 cm lymph node with an SUV of 10.1   PLAN: [name] is now a year and a half out from definitive diagnosis of metastatic breast cancer.  We are treating her with TDM 1, and echocardiography shows an excellent ejection fraction.  Her most recent PET scan shows essentially stable disease, although there is a very small very hot right hilar area nodule and we discussed this at length today.  I have asked pulmonary to review her PET and they do feel it may be possible to approach this hotspot bronchoscopically.  Can you has not yet heard from them but if she has not received a call from pulmonary within the next few days she will let us know.  Once we know what we are dealing with we can decide whether to move to [place], or continue current treatment, or perhaps switch to a completely different not HER-2 based therapy.  Assured her that if she does decide to apply for disability she will be approved as she has incurable metastatic cancer.  For now however we are continuing T-DM1 as scheduled and she will see me with the next dose 3 weeks from now.  Total encounter time 40 minutes.Sarajane Jews C. Seichi Kaufhold, MD 08/31/20 8:15 AM Medical Oncology and Hematology [facility] [place], [place] [address] Tel. [phone]    Fax. [phone]   I, Wilburn Mylar, am acting as scribe for Dr. Virgie Dad. Aarik Blank.  I, Lurline Del MD, have reviewed the above documentation for accuracy and completeness, and I agree with the above.    *Total Encounter Time as defined by the Centers for Medicare and Medicaid Services  includes, in addition to the face-to-face time of a patient visit (documented in the note above) non-face-to-face time: obtaining and reviewing outside history, ordering and reviewing medications, tests or procedures, care coordination (communications with other health care professionals or caregivers) and documentation in the medical record.

## 2020-08-30 ENCOUNTER — Other Ambulatory Visit: Payer: Self-pay | Admitting: Emergency Medicine

## 2020-08-30 ENCOUNTER — Inpatient Hospital Stay (HOSPITAL_BASED_OUTPATIENT_CLINIC_OR_DEPARTMENT_OTHER): Payer: BC Managed Care – PPO | Admitting: Oncology

## 2020-08-30 ENCOUNTER — Inpatient Hospital Stay: Payer: BC Managed Care – PPO

## 2020-08-30 ENCOUNTER — Telehealth: Payer: Self-pay | Admitting: *Deleted

## 2020-08-30 ENCOUNTER — Other Ambulatory Visit: Payer: Self-pay

## 2020-08-30 ENCOUNTER — Telehealth: Payer: Self-pay | Admitting: Oncology

## 2020-08-30 VITALS — BP 102/56 | HR 71 | Temp 98.3°F | Resp 17

## 2020-08-30 VITALS — BP 102/65 | HR 66 | Temp 98.4°F | Resp 18 | Ht 59.0 in | Wt 189.9 lb

## 2020-08-30 DIAGNOSIS — Z79899 Other long term (current) drug therapy: Secondary | ICD-10-CM | POA: Diagnosis not present

## 2020-08-30 DIAGNOSIS — C50411 Malignant neoplasm of upper-outer quadrant of right female breast: Secondary | ICD-10-CM | POA: Diagnosis not present

## 2020-08-30 DIAGNOSIS — Z170 Estrogen receptor positive status [ER+]: Secondary | ICD-10-CM

## 2020-08-30 DIAGNOSIS — Z5112 Encounter for antineoplastic immunotherapy: Secondary | ICD-10-CM | POA: Diagnosis not present

## 2020-08-30 DIAGNOSIS — Z95828 Presence of other vascular implants and grafts: Secondary | ICD-10-CM

## 2020-08-30 DIAGNOSIS — C7951 Secondary malignant neoplasm of bone: Secondary | ICD-10-CM

## 2020-08-30 DIAGNOSIS — R590 Localized enlarged lymph nodes: Secondary | ICD-10-CM

## 2020-08-30 LAB — CBC WITH DIFFERENTIAL/PLATELET
Abs Immature Granulocytes: 0.01 10*3/uL (ref 0.00–0.07)
Basophils Absolute: 0 10*3/uL (ref 0.0–0.1)
Basophils Relative: 1 %
Eosinophils Absolute: 0.1 10*3/uL (ref 0.0–0.5)
Eosinophils Relative: 1 %
HCT: 28.1 % — ABNORMAL LOW (ref 36.0–46.0)
Hemoglobin: 9.2 g/dL — ABNORMAL LOW (ref 12.0–15.0)
Immature Granulocytes: 0 %
Lymphocytes Relative: 23 %
Lymphs Abs: 1.1 10*3/uL (ref 0.7–4.0)
MCH: 27.1 pg (ref 26.0–34.0)
MCHC: 32.7 g/dL (ref 30.0–36.0)
MCV: 82.9 fL (ref 80.0–100.0)
Monocytes Absolute: 0.8 10*3/uL (ref 0.1–1.0)
Monocytes Relative: 16 %
Neutro Abs: 2.9 10*3/uL (ref 1.7–7.7)
Neutrophils Relative %: 59 %
Platelets: 68 10*3/uL — ABNORMAL LOW (ref 150–400)
RBC: 3.39 MIL/uL — ABNORMAL LOW (ref 3.87–5.11)
RDW: 18.9 % — ABNORMAL HIGH (ref 11.5–15.5)
WBC: 4.9 10*3/uL (ref 4.0–10.5)
nRBC: 0 % (ref 0.0–0.2)

## 2020-08-30 LAB — COMPREHENSIVE METABOLIC PANEL
ALT: 61 U/L — ABNORMAL HIGH (ref 0–44)
AST: 92 U/L — ABNORMAL HIGH (ref 15–41)
Albumin: 3 g/dL — ABNORMAL LOW (ref 3.5–5.0)
Alkaline Phosphatase: 107 U/L (ref 38–126)
Anion gap: 5 (ref 5–15)
BUN: 29 mg/dL — ABNORMAL HIGH (ref 6–20)
CO2: 24 mmol/L (ref 22–32)
Calcium: 10.6 mg/dL — ABNORMAL HIGH (ref 8.9–10.3)
Chloride: 104 mmol/L (ref 98–111)
Creatinine, Ser: 1.7 mg/dL — ABNORMAL HIGH (ref 0.44–1.00)
GFR calc Af Amer: 41 mL/min — ABNORMAL LOW (ref >60)
GFR calc non Af Amer: 35 mL/min — ABNORMAL LOW (ref >60)
Glucose, Bld: 137 mg/dL — ABNORMAL HIGH (ref 70–99)
Potassium: 4 mmol/L (ref 3.5–5.1)
Sodium: 133 mmol/L — ABNORMAL LOW (ref 135–145)
Total Bilirubin: 0.8 mg/dL (ref 0.3–1.2)
Total Protein: 7.6 g/dL (ref 6.5–8.1)

## 2020-08-30 MED ORDER — DIPHENHYDRAMINE HCL 25 MG PO CAPS
25.0000 mg | ORAL_CAPSULE | Freq: Once | ORAL | Status: AC
  Administered 2020-08-30: 25 mg via ORAL

## 2020-08-30 MED ORDER — DIPHENHYDRAMINE HCL 25 MG PO CAPS
ORAL_CAPSULE | ORAL | Status: AC
  Filled 2020-08-30: qty 1

## 2020-08-30 MED ORDER — ACETAMINOPHEN 325 MG PO TABS
650.0000 mg | ORAL_TABLET | Freq: Once | ORAL | Status: AC
  Administered 2020-08-30: 650 mg via ORAL

## 2020-08-30 MED ORDER — HEPARIN SOD (PORK) LOCK FLUSH 100 UNIT/ML IV SOLN
500.0000 [IU] | Freq: Once | INTRAVENOUS | Status: AC | PRN
  Administered 2020-08-30: 500 [IU]
  Filled 2020-08-30: qty 5

## 2020-08-30 MED ORDER — SODIUM CHLORIDE 0.9 % IV SOLN
Freq: Once | INTRAVENOUS | Status: AC
  Filled 2020-08-30: qty 250

## 2020-08-30 MED ORDER — SODIUM CHLORIDE 0.9% FLUSH
10.0000 mL | Freq: Once | INTRAVENOUS | Status: AC
  Administered 2020-08-30: 10 mL
  Filled 2020-08-30: qty 10

## 2020-08-30 MED ORDER — SODIUM CHLORIDE 0.9% FLUSH
10.0000 mL | INTRAVENOUS | Status: DC | PRN
  Administered 2020-08-30: 10 mL
  Filled 2020-08-30: qty 10

## 2020-08-30 MED ORDER — ACETAMINOPHEN 325 MG PO TABS
ORAL_TABLET | ORAL | Status: AC
  Filled 2020-08-30: qty 2

## 2020-08-30 MED ORDER — SODIUM CHLORIDE 0.9 % IV SOLN
3.4000 mg/kg | Freq: Once | INTRAVENOUS | Status: AC
  Administered 2020-08-30: 300 mg via INTRAVENOUS
  Filled 2020-08-30: qty 15

## 2020-08-30 NOTE — Telephone Encounter (Signed)
Per MD - proceed with treatment today despite abnormal labs.

## 2020-08-30 NOTE — Patient Instructions (Signed)
[facility] Discharge Instructions for Patients Receiving Chemotherapy  Today you received the following chemotherapy agents Kadcyla  To help prevent nausea and vomiting after your treatment, we encourage you to take your nausea medication as directed   If you develop nausea and vomiting that is not controlled by your nausea medication, call the clinic.   BELOW ARE SYMPTOMS THAT SHOULD BE REPORTED IMMEDIATELY:  *FEVER GREATER THAN 100.5 F  *CHILLS WITH OR WITHOUT FEVER  NAUSEA AND VOMITING THAT IS NOT CONTROLLED WITH YOUR NAUSEA MEDICATION  *UNUSUAL SHORTNESS OF BREATH  *UNUSUAL BRUISING OR BLEEDING  TENDERNESS IN MOUTH AND THROAT WITH OR WITHOUT PRESENCE OF ULCERS  *URINARY PROBLEMS  *BOWEL PROBLEMS  UNUSUAL RASH Items with * indicate a potential emergency and should be followed up as soon as possible.  Feel free to call the clinic should you have any questions or concerns. The clinic phone number is [phone].  Please show the CHEMO ALERT CARD at check-in to the Emergency Department and triage nurse.   

## 2020-08-30 NOTE — Progress Notes (Signed)
Need to set up EBUS at [facility]

## 2020-08-30 NOTE — Progress Notes (Signed)
Nutrition Assessment:  Patient identified on Malnutrition Screening report for weight loss and poor appetite.   49 year old female with metastatic triple positive breast cancer.  Past medical history of right mastectomy, TIA, HTN.  Patient currently on kadcyla.   Met with patient during infusion.  Patient reports that weight loss is mostly intentional.  She has stopped drinking soda, eating desserts, less microwave meals due to sodium.  She reports that she is fearful about eating the wrong foods that will effect her cancer.  Reports that especially following infusion has decreased appetite, nausea (takes nausea medications), fatigue.  Has had issues with mouth sores.  Patient reports eating for dinner last night chicken, broccoli and potatoes. Patient reports some taste alterations as well.     NFPE performed and no muscle mass or fat mass loss noted.   Medications: reviewed  Labs: reviewed   Anthropometrics:   Height: 4'11" Weight: 189 lb today 230 since Jan 2021 BMI: 38 18% weight loss in the last 8 months, intentional per patient   NUTRITION DIAGNOSIS: Food and nutrition related knowledge deficit related to cancer diagnosis as evidenced by questions regarding foods to eat   INTERVENTION:  Discussed current recommendations regarding nutrition and cancer from [facility]. Encouraged good source of protein at every meal.  Encouraged taking antinausea medication during days after infusion that can prevent patient from getting good nutrition. Discussed importance of maintaining muscle mass during treatment.   Discussed strategies to help with taste alterations.  Contact information provided to patient    MONITORING, EVALUATION, GOAL: weight trends, intake   NEXT VISIT: October 20th during infusion (Wednesday)  Joli B. Allen, RD, LDN Registered Dietitian [phone] (mobile)     

## 2020-08-30 NOTE — Telephone Encounter (Signed)
Scheduled appts per 9/29 los. Pt stated she would refer to mychart for AVS and appts.

## 2020-08-31 ENCOUNTER — Telehealth: Payer: Self-pay | Admitting: Emergency Medicine

## 2020-08-31 LAB — FOLLICLE STIMULATING HORMONE: FSH: 72.4 m[IU]/mL

## 2020-08-31 NOTE — Telephone Encounter (Signed)
atc patient unable to reach left message to call back 

## 2020-08-31 NOTE — Telephone Encounter (Signed)
Pt has been scheduled for EBUS at [facility] on 10/5 at 1:00  She will have her covid test on 10/2 at 12:10.  I called pt to give her the appt info.  She is nervous and would like someone to call her to go over procedure with her again to ease her mind.

## 2020-09-01 LAB — ESTRADIOL, ULTRA SENS: Estradiol, Sensitive: <2.5 pg/mL

## 2020-09-01 NOTE — Telephone Encounter (Signed)
Spoke with the pt and answered her questions about her procedure  Nothing further needed

## 2020-09-02 ENCOUNTER — Other Ambulatory Visit (HOSPITAL_COMMUNITY)
Admission: RE | Admit: 2020-09-02 | Discharge: 2020-09-02 | Disposition: A | Discharge status: Home / Self Care | Payer: BC Managed Care – PPO | Source: Ambulatory Visit | Attending: Emergency Medicine | Admitting: Emergency Medicine

## 2020-09-02 DIAGNOSIS — Z20822 Contact with and (suspected) exposure to covid-19: Secondary | ICD-10-CM | POA: Insufficient documentation

## 2020-09-02 DIAGNOSIS — Z01812 Encounter for preprocedural laboratory examination: Secondary | ICD-10-CM | POA: Insufficient documentation

## 2020-09-02 LAB — SARS CORONAVIRUS 2 (TAT 6-24 HRS): SARS Coronavirus 2: NEGATIVE

## 2020-09-04 NOTE — Anesthesia Preprocedure Evaluation (Addendum)
Anesthesia Evaluation  Patient identified by MRN, date of birth, ID band Patient awake    Reviewed: Allergy & Precautions, NPO status , Patient's Chart, lab work & pertinent test results, reviewed documented beta blocker date and time   Airway Mallampati: II  TM Distance: >3 FB Neck ROM: Full    Dental no notable dental hx. (+) Teeth Intact, Missing, Dental Advisory Given,    Pulmonary neg pulmonary ROS,    Pulmonary exam normal breath sounds clear to auscultation       Cardiovascular hypertension, Pt. on home beta blockers and Pt. on medications Normal cardiovascular exam Rhythm:Regular Rate:Normal  Echo 07/2020: 1. Mild intracavitary gradient. Peak velocity 0.98 m/s. Peak gradient 3.8  mmHg. Left ventricular ejection fraction, by estimation, is 65 to 70%. The  left ventricle has normal function. The left ventricle has no regional  wall motion abnormalities. There  is mild concentric left ventricular hypertrophy. Left ventricular  diastolic parameters are indeterminate. The average left ventricular  global longitudinal strain is -23.7 %. The global longitudinal strain is  normal.  2. Right ventricular systolic function is normal. The right ventricular  size is normal. There is normal pulmonary artery systolic pressure.  3. Left atrial size was moderately dilated.  4. The mitral valve is normal in structure. Trivial mitral valve  regurgitation. No evidence of mitral stenosis.  5. The aortic valve is tricuspid. Aortic valve regurgitation is not  visualized. No aortic stenosis is present.  6. The inferior vena cava is normal in size with greater than 50%  respiratory variability, suggesting right atrial pressure of 3 mmHg.    Neuro/Psych Anxiety TIA (2018) Neuromuscular disease negative psych ROS   GI/Hepatic negative GI ROS, Neg liver ROS,   Endo/Other  Obesity BMI 38  Renal/GU Renal InsufficiencyRenal diseaseCr 1.7   negative genitourinary   Musculoskeletal negative musculoskeletal ROS (+)   Abdominal (+) + obese,   Peds negative pediatric ROS (+)  Hematology  (+) Blood dyscrasia, anemia , 9.2/28.1   Anesthesia Other Findings Breast CA s/p R masectomy  Reproductive/Obstetrics negative OB ROS                           Anesthesia Physical Anesthesia Plan  ASA: III  Anesthesia Plan: General   Post-op Pain Management:    Induction: Intravenous  PONV Risk Score and Plan: 3 and Treatment may vary due to age or medical condition, Ondansetron and Midazolam  Airway Management Planned: Oral ETT  Additional Equipment: None  Intra-op Plan:   Post-operative Plan: Extubation in OR  Informed Consent: I have reviewed the patients History and Physical, chart, labs and discussed the procedure including the risks, benefits and alternatives for the proposed anesthesia with the patient or authorized representative who has indicated his/her understanding and acceptance.     Dental advisory given  Plan Discussed with: CRNA and Anesthesiologist  Anesthesia Plan Comments: (Difficult IV access- attempted PIV once, will get port accessed)      Anesthesia Quick Evaluation

## 2020-09-05 ENCOUNTER — Encounter (HOSPITAL_COMMUNITY): Payer: Self-pay | Admitting: Emergency Medicine

## 2020-09-05 ENCOUNTER — Ambulatory Visit (HOSPITAL_COMMUNITY): Payer: BC Managed Care – PPO | Admitting: Anesthesiology

## 2020-09-05 ENCOUNTER — Encounter (HOSPITAL_COMMUNITY)
Admission: RE | Disposition: A | Discharge status: Home / Self Care | Payer: Self-pay | Source: Ambulatory Visit | Attending: Emergency Medicine

## 2020-09-05 ENCOUNTER — Ambulatory Visit (HOSPITAL_COMMUNITY)
Admission: RE | Admit: 2020-09-05 | Discharge: 2020-09-05 | Disposition: A | Discharge status: Home / Self Care | Payer: BC Managed Care – PPO | Source: Ambulatory Visit | Attending: Emergency Medicine | Admitting: Emergency Medicine

## 2020-09-05 ENCOUNTER — Other Ambulatory Visit: Payer: Self-pay

## 2020-09-05 DIAGNOSIS — Z7982 Long term (current) use of aspirin: Secondary | ICD-10-CM | POA: Diagnosis not present

## 2020-09-05 DIAGNOSIS — Z794 Long term (current) use of insulin: Secondary | ICD-10-CM | POA: Diagnosis not present

## 2020-09-05 DIAGNOSIS — Z79899 Other long term (current) drug therapy: Secondary | ICD-10-CM | POA: Diagnosis not present

## 2020-09-05 DIAGNOSIS — Z170 Estrogen receptor positive status [ER+]: Secondary | ICD-10-CM

## 2020-09-05 DIAGNOSIS — Z807 Family history of other malignant neoplasms of lymphoid, hematopoietic and related tissues: Secondary | ICD-10-CM | POA: Insufficient documentation

## 2020-09-05 DIAGNOSIS — C50411 Malignant neoplasm of upper-outer quadrant of right female breast: Secondary | ICD-10-CM

## 2020-09-05 DIAGNOSIS — E109 Type 1 diabetes mellitus without complications: Secondary | ICD-10-CM | POA: Insufficient documentation

## 2020-09-05 DIAGNOSIS — Z91040 Latex allergy status: Secondary | ICD-10-CM | POA: Diagnosis not present

## 2020-09-05 DIAGNOSIS — Z9221 Personal history of antineoplastic chemotherapy: Secondary | ICD-10-CM | POA: Diagnosis not present

## 2020-09-05 DIAGNOSIS — Z9011 Acquired absence of right breast and nipple: Secondary | ICD-10-CM | POA: Insufficient documentation

## 2020-09-05 DIAGNOSIS — Z8249 Family history of ischemic heart disease and other diseases of the circulatory system: Secondary | ICD-10-CM | POA: Diagnosis not present

## 2020-09-05 DIAGNOSIS — R590 Localized enlarged lymph nodes: Secondary | ICD-10-CM

## 2020-09-05 DIAGNOSIS — G629 Polyneuropathy, unspecified: Secondary | ICD-10-CM | POA: Insufficient documentation

## 2020-09-05 DIAGNOSIS — F419 Anxiety disorder, unspecified: Secondary | ICD-10-CM | POA: Diagnosis not present

## 2020-09-05 DIAGNOSIS — Z8673 Personal history of transient ischemic attack (TIA), and cerebral infarction without residual deficits: Secondary | ICD-10-CM | POA: Insufficient documentation

## 2020-09-05 DIAGNOSIS — Z833 Family history of diabetes mellitus: Secondary | ICD-10-CM | POA: Diagnosis not present

## 2020-09-05 DIAGNOSIS — G510 Bell's palsy: Secondary | ICD-10-CM | POA: Diagnosis not present

## 2020-09-05 DIAGNOSIS — Z853 Personal history of malignant neoplasm of breast: Secondary | ICD-10-CM | POA: Diagnosis not present

## 2020-09-05 DIAGNOSIS — Z801 Family history of malignant neoplasm of trachea, bronchus and lung: Secondary | ICD-10-CM | POA: Diagnosis not present

## 2020-09-05 DIAGNOSIS — Z888 Allergy status to other drugs, medicaments and biological substances status: Secondary | ICD-10-CM | POA: Diagnosis not present

## 2020-09-05 DIAGNOSIS — Z923 Personal history of irradiation: Secondary | ICD-10-CM | POA: Diagnosis not present

## 2020-09-05 DIAGNOSIS — Z823 Family history of stroke: Secondary | ICD-10-CM | POA: Diagnosis not present

## 2020-09-05 DIAGNOSIS — I10 Essential (primary) hypertension: Secondary | ICD-10-CM | POA: Insufficient documentation

## 2020-09-05 DIAGNOSIS — N179 Acute kidney failure, unspecified: Secondary | ICD-10-CM | POA: Insufficient documentation

## 2020-09-05 HISTORY — PX: VIDEO BRONCHOSCOPY: SHX5072

## 2020-09-05 HISTORY — PX: ENDOBRONCHIAL ULTRASOUND: SHX5096

## 2020-09-05 SURGERY — VIDEO BRONCHOSCOPY WITHOUT FLUORO
Anesthesia: General

## 2020-09-05 MED ORDER — SODIUM CHLORIDE 0.9% FLUSH: 10.0000 mL | INTRAVENOUS | Status: DC | PRN

## 2020-09-05 MED ORDER — PROPOFOL 10 MG/ML IV BOLUS
INTRAVENOUS | Status: AC
  Filled 2020-09-05: qty 20

## 2020-09-05 MED ORDER — FENTANYL CITRATE (PF) 100 MCG/2ML IJ SOLN
INTRAMUSCULAR | Status: AC
  Filled 2020-09-05: qty 2

## 2020-09-05 MED ORDER — MIDAZOLAM HCL 2 MG/2ML IJ SOLN
INTRAMUSCULAR | Status: AC
  Filled 2020-09-05: qty 2

## 2020-09-05 MED ORDER — ONDANSETRON HCL 4 MG/2ML IJ SOLN
INTRAMUSCULAR | Status: DC | PRN
  Administered 2020-09-05: 4 mg via INTRAVENOUS

## 2020-09-05 MED ORDER — LIDOCAINE 2% (20 MG/ML) 5 ML SYRINGE
INTRAMUSCULAR | Status: DC | PRN
  Administered 2020-09-05: 100 mg via INTRAVENOUS

## 2020-09-05 MED ORDER — REMIFENTANIL HCL 1 MG IV SOLR
INTRAVENOUS | Status: AC
  Filled 2020-09-05: qty 1000

## 2020-09-05 MED ORDER — CHLORHEXIDINE GLUCONATE CLOTH 2 % EX PADS: 6.0000 | MEDICATED_PAD | Freq: Every day | CUTANEOUS | Status: DC

## 2020-09-05 MED ORDER — PROPOFOL 10 MG/ML IV BOLUS
INTRAVENOUS | Status: DC | PRN
  Administered 2020-09-05: 150 mg via INTRAVENOUS

## 2020-09-05 MED ORDER — SUGAMMADEX SODIUM 200 MG/2ML IV SOLN
INTRAVENOUS | Status: DC | PRN
  Administered 2020-09-05: 200 mg via INTRAVENOUS

## 2020-09-05 MED ORDER — FENTANYL CITRATE (PF) 100 MCG/2ML IJ SOLN
INTRAMUSCULAR | Status: DC | PRN
  Administered 2020-09-05: 50 ug via INTRAVENOUS

## 2020-09-05 MED ORDER — LACTATED RINGERS IV SOLN: INTRAVENOUS | Status: DC

## 2020-09-05 MED ORDER — SODIUM CHLORIDE 0.9 % IV SOLN
INTRAVENOUS | Status: DC | PRN
Start: 2020-09-05 — End: 2020-09-05
  Administered 2020-09-05: .1 ug/kg/min via INTRAVENOUS

## 2020-09-05 MED ORDER — PHENYLEPHRINE HCL-NACL 10-0.9 MG/250ML-% IV SOLN
INTRAVENOUS | Status: DC | PRN
  Administered 2020-09-05: 5 ug/min via INTRAVENOUS

## 2020-09-05 MED ORDER — MIDAZOLAM HCL 5 MG/5ML IJ SOLN: INTRAMUSCULAR | Status: DC | PRN

## 2020-09-05 MED ORDER — ROCURONIUM BROMIDE 10 MG/ML (PF) SYRINGE
PREFILLED_SYRINGE | INTRAVENOUS | Status: DC | PRN
  Administered 2020-09-05: 50 mg via INTRAVENOUS

## 2020-09-05 MED ORDER — REMIFENTANIL HCL 1 MG IV SOLR
INTRAVENOUS | Status: AC
Start: 2020-09-05 — End: ?
  Filled 2020-09-05: qty 1000

## 2020-09-05 MED ORDER — PHENYLEPHRINE HCL (PRESSORS) 10 MG/ML IV SOLN
INTRAVENOUS | Status: DC | PRN
  Administered 2020-09-05 (×2): 80 ug via INTRAVENOUS

## 2020-09-05 MED ORDER — HEPARIN SOD (PORK) LOCK FLUSH 100 UNIT/ML IV SOLN
500.0000 [IU] | INTRAVENOUS | Status: AC | PRN
  Administered 2020-09-05: 500 [IU]

## 2020-09-05 NOTE — Op Note (Signed)
Video Bronchoscopy with Endobronchial Ultrasound Procedure Note  Date of Operation: 09/05/2020  Pre-op Diagnosis: Right hilar lymphadenopathy  Post-op Diagnosis: Normal bronchoscopy  Surgeon: Baltazar Apo  Assistants: None  Anesthesia: General endotracheal anesthesia  Operation: Flexible video fiberoptic bronchoscopy with endobronchial ultrasound and biopsies.  Estimated Blood Loss: None  Complications: None apparent  Indications and History: [name] [name] is a 49 y.o. female with history of breast cancer.  She was found to have a small hypermetabolic right hilar lymph node on restaging PET scan.  Recommendation was made to seek a tissue diagnosis with the bronchoscopy with endobronchial ultrasound.  The risks, benefits, complications, treatment options and expected outcomes were discussed with the patient.  The possibilities of pneumothorax, pneumonia, reaction to medication, pulmonary aspiration, perforation of a viscus, bleeding, failure to diagnose a condition and creating a complication requiring transfusion or operation were discussed with the patient who freely signed the consent.    Description of Procedure: The patient was examined in the preoperative area and history and data from the preprocedure consultation were reviewed. It was deemed appropriate to proceed.  The patient was taken to [facility] endoscopy room 1, identified as [name] [name] and the procedure verified as Flexible Video Fiberoptic Bronchoscopy.  A Time Out was held and the above information confirmed. After being taken to the operating room general anesthesia was initiated and the patient  was orally intubated. The video fiberoptic bronchoscope was introduced via the endotracheal tube and a general inspection was performed which showed normal airways throughout.  There were no endobronchial lesions or abnormal secretions seen. The standard scope was then withdrawn and the endobronchial ultrasound was used to  identify and characterize the peritracheal, hilar and bronchial lymph nodes. Inspection showed small node at station 4R that was normal in appearance.  The right hilar region was inspected via both the right middle lobe airway and the right lower lobe airway.  The scope was directed towards the region where the identified node was seen on PET scan.  There was no detectable 11 R node in this region.  Therefore no sampling was performed. The patient tolerated the procedure well without apparent complications. There was no significant blood loss. The bronchoscope was withdrawn. Anesthesia was reversed and the patient was taken to endoscopy for recovery.   Samples: None  Plans:  The patient will be discharged from the endoscopy unit to home when recovered from anesthesia. Outpatient followup will be with Dr. Jana Hakim.   Baltazar Apo, MD, PhD 09/05/2020, 1:58 PM [facility] [phone] or if no answer [phone]

## 2020-09-05 NOTE — Anesthesia Procedure Notes (Addendum)
Procedure Name: Intubation Date/Time: 09/05/2020 1:14 PM Performed by: Lissa Morales, CRNA Pre-anesthesia Checklist: Patient identified, Emergency Drugs available, Suction available and Patient being monitored Patient Re-evaluated:Patient Re-evaluated prior to induction Oxygen Delivery Method: Circle system utilized Preoxygenation: Pre-oxygenation with 100% oxygen Induction Type: IV induction Ventilation: Mask ventilation without difficulty Laryngoscope Size: Mac and 4 Grade View: Grade I Tube type: Oral Tube size: 9.0 mm Number of attempts: 1 Airway Equipment and Method: Stylet and Oral airway Placement Confirmation: ETT inserted through vocal cords under direct vision,  positive ETCO2 and breath sounds checked- equal and bilateral Secured at: 21 cm Tube secured with: Tape Dental Injury: Teeth and Oropharynx as per pre-operative assessment

## 2020-09-05 NOTE — Discharge Instructions (Signed)
Flexible Bronchoscopy, Care After This sheet gives you information about how to care for yourself after your test. Your doctor may also give you more specific instructions. If you have problems or questions, contact your doctor. Follow these instructions at home: Eating and drinking  Do not eat or drink anything (not even water) for 2 hours after your test, or until your numbing medicine (local anesthetic) wears off.  When your numbness is gone and your cough and gag reflexes have come back, you may: ? Eat only soft foods. ? Slowly drink liquids.  The day after the test, go back to your normal diet. Driving  Do not drive for 24 hours if you were given a medicine to help you relax (sedative).  Do not drive or use heavy machinery while taking prescription pain medicine. General instructions   Take over-the-counter and prescription medicines only as told by your doctor.  Return to your normal activities as told. Ask what activities are safe for you.  Do not use any products that have nicotine or tobacco in them. This includes cigarettes and e-cigarettes. If you need help quitting, ask your doctor.  Keep all follow-up visits as told by your doctor. This is important. It is very important if you had a tissue sample (biopsy) taken. Get help right away if:  You have shortness of breath that gets worse.  You get light-headed.  You feel like you are going to pass out (faint).  You have chest pain.  You cough up: ? More than a little blood. ? More blood than before. Summary  Do not eat or drink anything (not even water) for 2 hours after your test, or until your numbing medicine wears off.  Do not use cigarettes. Do not use e-cigarettes.  Get help right away if you have chest pain.  Please call our office for any questions or concerns. [phone].   This information is not intended to replace advice given to you by your health care provider. Make sure you discuss any  questions you have with your health care provider. Document Revised: 10/31/2017 Document Reviewed: 12/06/2016 Elsevier Patient Education  2020 [employer].

## 2020-09-05 NOTE — H&P (Addendum)
[name] [name] is an 49 y.o. female.   Chief Complaint: Dyspnea HPI: [name] [name] is 7, never smoker, who is followed by Dr. Jana Hakim for breast cancer.  She is undergoing right mastectomy, received chemo, and is treated with T-DM 1, anastrozole, Xgeva.  She had a restaging PET scan on 08/24/2020 that I reviewed, showed some sclerotic bony foci, a stable soft tissue left shoulder nodule, and a small hypermetabolic right hilar lymph node at the right below carina, measures approximately 6 mm.  She is referred for bronchoscopy with endobronchial ultrasound to consider needle biopsy of this area of hypermetabolism. She reports no new issues or problems.   Past Medical History:  Diagnosis Date  . Anxiety   . Breast cancer, left breast ([place]) 2000   Underwent lumpectomy, chemotherapy and radiation  . Facial paralysis/Bells palsy    left side  . Family history of lung cancer   . Family history of lymphoma   . Hypertension   . Neuropathy    IN FINGERS AND TOES   RECENT INFUSION OF CHEMO  . Personal history of breast cancer 05/06/2018  . Renal disorder    just after TIA acute kidney injury  . TIA (transient ischemic attack) 03/16/2017   [facility]    Past Surgical History:  Procedure Laterality Date  . BREAST LUMPECTOMY WITH AXILLARY LYMPH NODE BIOPSY Left 2000   biopsy  . BREAST SURGERY     partial mastectomy with lymph node removal  . MASTECTOMY Right 2019   & partial mastectomy left breast 2000  . MASTECTOMY MODIFIED RADICAL Right 08/26/2018   Procedure: MASTECTOMY MODIFIED RADICAL;  Surgeon: Jovita Kussmaul, MD;  Location: [facility];  Service: General;  Laterality: Right;  . MODIFIED MASTECTOMY Right 08/26/2018  . PORTACATH PLACEMENT Left 04/08/2018   Procedure: INSERTION PORT-A-CATH;  Surgeon: Jovita Kussmaul, MD;  Location: [place];  Service: General;  Laterality: Left;  . TUBAL LIGATION Bilateral 2004  . WISDOM TOOTH EXTRACTION      Family History  Problem Relation Age of Onset  .  Hypertension Mother   . Hypertension Father   . Diverticulosis Father   . Diabetes Father   . Lung cancer Father 62       hx smoking  . Hypertension Maternal Grandmother   . CAD Maternal Grandmother   . Alzheimer's disease Maternal Grandmother        died at 52  . Hypertension Paternal Grandmother   . CAD Paternal Grandmother   . Stroke Paternal Grandfather   . Sickle cell trait Paternal Grandfather   . Pneumonia Maternal Aunt        died at a young adult  . Lymphoma Paternal Aunt 74   Social History:  reports that she has never smoked. She has never used smokeless tobacco. She reports that she does not drink alcohol and does not use drugs.  Allergies:  Allergies  Allergen Reactions  . Ace Inhibitors Swelling    Swelling of lips and face  . Lisinopril Swelling    Swelling of lips, face  . Amlodipine Swelling    Leg swelling  . Shellfish Allergy Swelling  . Adhesive [Tape] Itching and Rash    Please use paper tape  . Latex Itching and Rash    Medications Prior to Admission  Medication Sig Dispense Refill  . acyclovir (ZOVIRAX) 400 MG tablet TAKE 1 TABLET BY MOUTH TWICE A DAY 60 tablet 1  . anastrozole (ARIMIDEX) 1 MG tablet Take 1 tablet (1 mg  total) by mouth daily. 90 tablet 4  . Ascorbic Acid (VITAMIN C) 1000 MG tablet Take 1,000 mg by mouth daily.    Marland Kitchen aspirin EC 81 MG tablet Take 81 mg by mouth daily.    . carvedilol (COREG) 3.125 MG tablet TAKE 1 TABLET (3.125 MG TOTAL) BY MOUTH 2 (TWO) TIMES DAILY WITH A MEAL. 180 tablet 2  . Cholecalciferol (VITAMIN D3 PO) Take by mouth daily.    . Cyanocobalamin (VITAMIN B12) 500 MCG TABS Take 500 mcg by mouth daily.     . diphenoxylate-atropine (LOMOTIL) 2.5-0.025 MG tablet 1 to 2 tablets 4 times daily as needed for diarrhea 30 tablet 2  . ferrous sulfate 325 (65 FE) MG tablet Take 1 tablet (325 mg total) by mouth daily. 30 tablet 0  . lidocaine (XYLOCAINE) 2 % solution Use as directed 5 mLs in the mouth or throat as needed for  mouth pain. Swish and spit 200 mL 1  . lidocaine-prilocaine (EMLA) cream Apply 1 application topically as needed.    Marland Kitchen losartan (COZAAR) 50 MG tablet Take 1 tablet (50 mg total) by mouth daily. 30 tablet 11  . magic mouthwash w/lidocaine SOLN Take 5 mLs by mouth 4 (four) times daily as needed for mouth pain. 240 mL 3  . Multiple Vitamin (MULITIVITAMIN WITH MINERALS) TABS Take 1 tablet by mouth daily.      . ondansetron (ZOFRAN ODT) 8 MG disintegrating tablet Take 1 tablet (8 mg total) by mouth every 8 (eight) hours as needed for nausea or vomiting. 20 tablet 2  . prochlorperazine (COMPAZINE) 10 MG tablet Take 1 tablet (10 mg total) by mouth every 6 (six) hours as needed (Nausea or vomiting). 60 tablet 1  . spironolactone-hydrochlorothiazide (ALDACTAZIDE) 25-25 MG tablet TAKE 1 TABLET BY MOUTH EVERY DAY 30 tablet 2  . venlafaxine XR (EFFEXOR-XR) 37.5 MG 24 hr capsule TAKE 1 CAPSULE BY MOUTH DAILY WITH BREAKFAST. 90 capsule 4  . zinc gluconate 50 MG tablet Take 50 mg by mouth daily.      No results found for this or any previous visit (from the past 48 hour(s)). No results found.  Review of Systems  Blood pressure 102/60, pulse 72, temperature 98.5 F (36.9 C), temperature source Oral, resp. rate 20, height 4\' 11"  (1.499 m), weight 83.5 kg, last menstrual period 12/21/2017, SpO2 99 %. Physical Exam  Gen: Pleasant, well-nourished, in no distress,  normal affect  ENT: No lesions,  mouth clear,  oropharynx clear, no postnasal drip  Neck: No JVD, no stridor  Lungs: No use of accessory muscles, no crackles or wheezing on normal respiration, no wheeze on forced expiration  Cardiovascular: RRR, heart sounds normal, no murmur or gallops, no peripheral edema  Abdomen: soft and NT, no HSM,  BS normal  Musculoskeletal: No deformities, no cyanosis or clubbing  Neuro: alert, awake, non focal  Skin: Warm, no lesions or rashes   Assessment/Plan Hypermetabolic node at station 10 R in a patient  with a history of metastatic breast cancer, concerning for possible malignancy. -Recommend bronchoscopy with endobronchial ultrasound and inspection, identification of lymphadenopathy.  If we are able to isolate the 10 are noted then will perform needle biopsies.  Risks and benefits of the procedure discussed with the patient.  She understands and agrees.  All questions answered.  Collene Gobble, MD 09/05/2020, 12:55 PM

## 2020-09-05 NOTE — Transfer of Care (Signed)
Immediate Anesthesia Transfer of Care Note  Patient: [name] [name]  Procedure(s) Performed: VIDEO BRONCHOSCOPY WITHOUT FLUORO (N/A ) ENDOBRONCHIAL ULTRASOUND (Bilateral )  Patient Location: PACU  Anesthesia Type:General  Level of Consciousness: awake, alert , oriented and patient cooperative  Airway & Oxygen Therapy: Patient Spontanous Breathing and Patient connected to face mask oxygen  Post-op Assessment: Report given to RN, Post -op Vital signs reviewed and stable and Patient moving all extremities X 4  Post vital signs: stable  Last Vitals:  Vitals Value Taken Time  BP 135/67 09/05/20 1410  Temp    Pulse 77 09/05/20 1420  Resp 22 09/05/20 1420  SpO2 100 % 09/05/20 1420  Vitals shown include unvalidated device data.  Last Pain:  Vitals:   09/05/20 1406  TempSrc:   PainSc: 0-No pain         Complications: No complications documented.

## 2020-09-06 ENCOUNTER — Encounter (HOSPITAL_COMMUNITY): Payer: Self-pay | Admitting: Emergency Medicine

## 2020-09-07 NOTE — Anesthesia Postprocedure Evaluation (Signed)
Anesthesia Post Note  Patient: [name] [name]  Procedure(s) Performed: VIDEO BRONCHOSCOPY WITHOUT FLUORO (N/A ) ENDOBRONCHIAL ULTRASOUND (Bilateral )     Patient location during evaluation: PACU Anesthesia Type: General Level of consciousness: awake and alert, oriented and patient cooperative Pain management: pain level controlled Vital Signs Assessment: post-procedure vital signs reviewed and stable Respiratory status: spontaneous breathing, nonlabored ventilation and respiratory function stable Cardiovascular status: blood pressure returned to baseline and stable Postop Assessment: no apparent nausea or vomiting Anesthetic complications: no   No complications documented.  Last Vitals:  Vitals:   09/05/20 1510 09/05/20 1518  BP:  123/71  Pulse: 72 73  Resp: 15 20  Temp:  36.7 C  SpO2: 100% 98%    Last Pain:  Vitals:   09/06/20 1124  TempSrc:   PainSc: 0-No pain                 Pervis Hocking

## 2020-09-08 ENCOUNTER — Other Ambulatory Visit: Payer: Self-pay | Admitting: Oncology

## 2020-09-08 NOTE — Progress Notes (Signed)
I called [name] [name] with the results of the bronchoscopy, which found no mass at the site of the PET abnormality.  It may have been a transient inflammatory problem.  We are making no changes and can use treatment.

## 2020-09-19 NOTE — Progress Notes (Signed)
[facility]  [phone] [phone]    ID: [name] [name] DOB: 1971-09-06  MR#: [id number]  QQV#:[id number]  Patient Care Team: Marda Stalker, PA-C as PCP - General (Family Medicine) Jerline Pain, MD as PCP - Cardiology (Cardiology) Dorea Duff, Virgie Dad, MD as Consulting Physician (Oncology) Loreta Ave, MD as Referring Physician (Internal Medicine) Donato Heinz, MD as Consulting Physician (Nephrology) Kyung Rudd, MD as Consulting Physician (Radiation Oncology) Jovita Kussmaul, MD as Consulting Physician (General Surgery) Larey Dresser, MD as Consulting Physician (Cardiology) Mauro Kaufmann, RN as Oncology Nurse Navigator Rockwell Germany, RN as Oncology Nurse Navigator OTHER MD:   CHIEF COMPLAINT: Triple positive breast cancer (s/p right mastectomy)  CURRENT TREATMENT: T-DM1; anastrozole; Xgeva (Q 6 w)   INTERVAL HISTORY: [name] returns today for follow-up and treatment of her triple positive breast cancer.   Since her last visit, she underwent bronchoscopy on 09/05/2020 under Dr. Lamonte Sakai. During the procedure, no mass was found at the site of the PET abnormality.  The 0.6 cm hotspot seen on PET likely was a transient finding.  She continues on TDM-1 given every three weeks.  She is tolerating this well with the exception of thrombocytopenia.  There are no readings under 50,000 however  She also continues on anastrozole.  She has no unusual side effects from this other than mild vaginal dryness which also of course is due to menopause  She last received denosumab/Xgeva on 07/20/2020.  We are doing this every 6 weeks.  She has no side effects from this that she is aware of.  Samreen's most recent echocardiogram was completed on 07/12/2020 and showed an EF of 65-70%.  She is not currently scheduled for her next echo.   REVIEW OF SYSTEMS: [name] is gaining more weight than she would like.  She is doing a exercise take twice a week.  She  is not on a particular diet at present.  She has not received both doses of the Pfizer vaccine and tolerated them well.  A detailed review of systems today was otherwise noncontributory   HISTORY OF CURRENT ILLNESS: From the original intake note:  [name] Heims palpated a lump on 01/15/2018. She felt soreness and shooting pain under her arm and in the right breast. She underwent bilateral diagnostic mammography with tomography and right breast ultrasonography at [facility] on 03/12/2018 showing: breast density category C. The is architectural distortion at the 11 o'clock position. There is also an oval mass in the right breast anterior depth. Examination of the right axilla showed enlarged lymph nodes. Ultrasonography showed a 4.6 cm mass in the right breast upper outer quadrant posterior depth. An additional 1.2 cm oval mass in the right breast 12 o'clock middle depth. The lymph nodes in the right axillary are highly suggestive of malignancy.   Accordingly on 03/16/2018 she proceeded to biopsy of the right breast area in question. The pathology from this procedure showed ([id number]): At both the 11 and 12 o'clock positions: Invasive ductal carcinoma grade II. Ductal carcinoma in situ, high grade, with necrosis and calcifications. Prognostic indicators significant for: estrogen receptor, 90% positive and progesterone receptor, 40% positive, both with strong staining intensity. Proliferation marker Ki67 at 80%. HER2 amplified with ratios HER2/CEP17 SIGNALS 6.90 and average HER2 copies per cell 14.50  The patient's subsequent history is as detailed below.   PAST MEDICAL HISTORY: Past Medical History:  Diagnosis Date  . Anxiety   . Breast cancer, left breast ([place]) 2000   Underwent lumpectomy,  chemotherapy and radiation  . Facial paralysis/Bells palsy    left side  . Family history of lung cancer   . Family history of lymphoma   . Hypertension   . Neuropathy    IN FINGERS AND TOES   RECENT INFUSION  OF CHEMO  . Personal history of breast cancer 05/06/2018  . Renal disorder    just after TIA acute kidney injury  . TIA (transient ischemic attack) 03/16/2017   [facility]   The patient has a previous history of left breast cancer diagnosed in 2000. She was seen by [facility] in [place], [place] under Dr. Loreta Ave. According to the patient, her left breast cancer was stage II with no lymph node involvement (likely T2N0). She had chemotherapy every 3 weeks for about 6 months. She did not takes any "red drugs." She also did not take anti-estrogens (likely ER/PR negative).   TIA in 2018. She saw a neurologist as a precaution. She denies any clotting issues.    PAST SURGICAL HISTORY: Past Surgical History:  Procedure Laterality Date  . BREAST LUMPECTOMY WITH AXILLARY LYMPH NODE BIOPSY Left 2000   biopsy  . BREAST SURGERY     partial mastectomy with lymph node removal  . ENDOBRONCHIAL ULTRASOUND Bilateral 09/05/2020   Procedure: ENDOBRONCHIAL ULTRASOUND;  Surgeon: Collene Gobble, MD;  Location: [facility];  Service: Cardiopulmonary;  Laterality: Bilateral;  . MASTECTOMY Right 2019   & partial mastectomy left breast 2000  . MASTECTOMY MODIFIED RADICAL Right 08/26/2018   Procedure: MASTECTOMY MODIFIED RADICAL;  Surgeon: Jovita Kussmaul, MD;  Location: [facility];  Service: General;  Laterality: Right;  . MODIFIED MASTECTOMY Right 08/26/2018  . PORTACATH PLACEMENT Left 04/08/2018   Procedure: INSERTION PORT-A-CATH;  Surgeon: Jovita Kussmaul, MD;  Location: [facility];  Service: General;  Laterality: Left;  . TUBAL LIGATION Bilateral 2004  . VIDEO BRONCHOSCOPY N/A 09/05/2020   Procedure: VIDEO BRONCHOSCOPY WITHOUT FLUORO;  Surgeon: Collene Gobble, MD;  Location: [facility];  Service: Cardiopulmonary;  Laterality: N/A;  . WISDOM TOOTH EXTRACTION      FAMILY HISTORY Family History  Problem Relation Age of Onset  . Hypertension Mother   . Hypertension Father   . Diverticulosis  Father   . Diabetes Father   . Lung cancer Father 23       hx smoking  . Hypertension Maternal Grandmother   . CAD Maternal Grandmother   . Alzheimer's disease Maternal Grandmother        died at 27  . Hypertension Paternal Grandmother   . CAD Paternal Grandmother   . Stroke Paternal Grandfather   . Sickle cell trait Paternal Grandfather   . Pneumonia Maternal Aunt        died at a young adult  . Lymphoma Paternal Aunt 49   The patient's father is alive at age 58 and had a history of lung cancer. The patient's mother is alive at age 41. The patient has 1 brother and no sisters. She denies a family history of breast or ovarian cancer.     GYNECOLOGIC HISTORY:  Patient's last menstrual period was 12/21/2017. Menarche: 49 years old Age at first live birth: 49 years old She is GXP3. She is no longer having periods, which were irregular and light. Her LMP was  January 2019. She is not having hot flashes. The patient used oral contraceptive from [phone] and the Depo-provera shot from [id number] with no complications. She never used HRT.    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  She is now working at [employer] as a Occupational psychologist.  Her husband, [name], is a Administrator. The patient's son, Shea Stakes age 71, works at [employer] in [place]. The patient's daughters, Lilia Pro age 20, and Levonne Spiller age 67, are students. The patient's adopted son, Vonna Kotyk age 61, is also a Ship broker.    ADVANCED DIRECTIVES: In the absence of any documents to the contrary the patient's husband is her healthcare power of attorney   HEALTH MAINTENANCE: Social History   Tobacco Use  . Smoking status: Never Smoker  . Smokeless tobacco: Never Used  Vaping Use  . Vaping Use: Never used  Substance Use Topics  . Alcohol use: No  . Drug use: No     Colonoscopy: n/a  PAP: 2015  Bone density: never done   Allergies  Allergen Reactions  . Ace Inhibitors Swelling    Swelling  of lips and face  . Lisinopril Swelling    Swelling of lips, face  . Amlodipine Swelling    Leg swelling  . Shellfish Allergy Swelling  . Adhesive [Tape] Itching and Rash    Please use paper tape  . Latex Itching and Rash    Current Outpatient Medications  Medication Sig Dispense Refill  . acyclovir (ZOVIRAX) 400 MG tablet TAKE 1 TABLET BY MOUTH TWICE A DAY 60 tablet 1  . anastrozole (ARIMIDEX) 1 MG tablet Take 1 tablet (1 mg total) by mouth daily. 90 tablet 4  . Ascorbic Acid (VITAMIN C) 1000 MG tablet Take 1,000 mg by mouth daily.    . carvedilol (COREG) 3.125 MG tablet TAKE 1 TABLET (3.125 MG TOTAL) BY MOUTH 2 (TWO) TIMES DAILY WITH A MEAL. 180 tablet 2  . Cholecalciferol (VITAMIN D3 PO) Take by mouth daily.    . Cyanocobalamin (VITAMIN B12) 500 MCG TABS Take 500 mcg by mouth daily.     . ferrous sulfate 325 (65 FE) MG tablet Take 1 tablet (325 mg total) by mouth daily. 30 tablet 0  . lidocaine (XYLOCAINE) 2 % solution Use as directed 5 mLs in the mouth or throat as needed for mouth pain. Swish and spit 200 mL 1  . lidocaine-prilocaine (EMLA) cream Apply 1 application topically as needed.    Marland Kitchen losartan (COZAAR) 50 MG tablet Take 1 tablet (50 mg total) by mouth daily. 30 tablet 11  . Multiple Vitamin (MULITIVITAMIN WITH MINERALS) TABS Take 1 tablet by mouth daily.      . prochlorperazine (COMPAZINE) 10 MG tablet Take 1 tablet (10 mg total) by mouth every 6 (six) hours as needed (Nausea or vomiting). 60 tablet 1  . spironolactone-hydrochlorothiazide (ALDACTAZIDE) 25-25 MG tablet TAKE 1 TABLET BY MOUTH EVERY DAY 30 tablet 2  . venlafaxine XR (EFFEXOR-XR) 37.5 MG 24 hr capsule TAKE 1 CAPSULE BY MOUTH DAILY WITH BREAKFAST. 90 capsule 4  . zinc gluconate 50 MG tablet Take 50 mg by mouth daily.     No current facility-administered medications for this visit.    OBJECTIVE:  African-American woman in no acute distress  Vitals:   09/20/20 0837  BP: 128/70  Pulse: 73  Resp: 18  Temp:  97.8 F (36.6 C)  SpO2: 99%   Wt Readings from Last 3 Encounters:  09/20/20 189 lb 3.2 oz (85.8 kg)  09/05/20 184 lb (83.5 kg)  08/30/20 189 lb 14.4 oz (86.1 kg)   Body mass index is 38.21 kg/m.    ECOG FS:1 - Symptomatic but  completely ambulatory  Sclerae unicteric, EOMs intact Wearing a mask No cervical or supraclavicular adenopathy Lungs no rales or rhonchi Heart regular rate and rhythm Abd soft, nontender, positive bowel sounds MSK no focal spinal tenderness, no upper extremity lymphedema Neuro: nonfocal, well oriented, appropriate affect Breasts: Deferred Skin: The skin lesion noted on PET scan was not identified today  LAB RESULTS:  CMP     Component Value Date/Time   NA 133 (L) 08/30/2020 0808   K 4.0 08/30/2020 0808   CL 104 08/30/2020 0808   CO2 24 08/30/2020 0808   GLUCOSE 137 (H) 08/30/2020 0808   BUN 29 (H) 08/30/2020 0808   CREATININE 1.70 (H) 08/30/2020 0808   CREATININE 1.56 (H) 07/31/2020 1400   CALCIUM 10.6 (H) 08/30/2020 0808   CALCIUM 11.2 (H) 02/24/2020 0830   PROT 7.6 08/30/2020 0808   ALBUMIN 3.0 (L) 08/30/2020 0808   AST 92 (H) 08/30/2020 0808   AST 134 (H) 07/31/2020 1400   ALT 61 (H) 08/30/2020 0808   ALT 81 (H) 07/31/2020 1400   ALKPHOS 107 08/30/2020 0808   BILITOT 0.8 08/30/2020 0808   BILITOT 0.9 07/31/2020 1400   GFRNONAA 35 (L) 08/30/2020 0808   GFRNONAA 39 (L) 07/31/2020 1400   GFRAA 41 (L) 08/30/2020 0808   GFRAA 45 (L) 07/31/2020 1400    Lab Results  Component Value Date   TOTALPROTELP 7.0 02/25/2019     Lab Results  Component Value Date   KPAFRELGTCHN 76.7 (H) 02/25/2019   LAMBDASER 27.2 (H) 02/25/2019   KAPLAMBRATIO 2.82 (H) 02/25/2019    Lab Results  Component Value Date   WBC 4.9 09/20/2020   NEUTROABS 3.0 09/20/2020   HGB 8.9 (L) 09/20/2020   HCT 27.6 (L) 09/20/2020   MCV 83.9 09/20/2020   PLT 75 (L) 09/20/2020   No results found for: LABCA2  No components found for: RWERXV400  No results for  input(s): INR in the last 168 hours.  No results found for: LABCA2  No results found for: QQP619  No results found for: JKD326  No results found for: ZTI458  Lab Results  Component Value Date   CA2729 38.7 (H) 02/25/2019    No components found for: HGQUANT  No results found for: CEA1 / No results found for: CEA1   No results found for: AFPTUMOR  No results found for: CHROMOGRNA  No results found for: HGBA, HGBA2QUANT, HGBFQUANT, HGBSQUAN (Hemoglobinopathy evaluation)   No results found for: LDH  No results found for: IRON, TIBC, IRONPCTSAT (Iron and TIBC)  No results found for: FERRITIN  Urinalysis    Component Value Date/Time   COLORURINE YELLOW 08/18/2019 [place] 08/18/2019 1420   LABSPEC 1.016 08/18/2019 1420   PHURINE 5.0 08/18/2019 1420   GLUCOSEU NEGATIVE 08/18/2019 1420   HGBUR NEGATIVE 08/18/2019 1420   BILIRUBINUR NEGATIVE 08/18/2019 1420   KETONESUR NEGATIVE 08/18/2019 1420   PROTEINUR NEGATIVE 08/18/2019 1420   UROBILINOGEN 0.2 11/29/2011 1656   NITRITE NEGATIVE 08/18/2019 1420   LEUKOCYTESUR SMALL (A) 08/18/2019 1420    STUDIES: NM PET Image Restag (PS) Skull Base To Thigh  Result Date: 08/24/2020 CLINICAL DATA:  Subsequent treatment strategy for breast cancer, assess treatment response. EXAM: NUCLEAR MEDICINE PET SKULL BASE TO THIGH TECHNIQUE: 9.33 mCi F-18 FDG was injected intravenously. Full-ring PET imaging was performed from the skull base to thigh after the radiotracer. CT data was obtained and used for attenuation correction and anatomic localization. Fasting blood glucose: 91 mg/dl COMPARISON:  March 08, 2020 FINDINGS: Mediastinal blood pool activity: SUV max Liver activity: SUV max NA NECK: No hypermetabolic lymph nodes in the neck. Incidental CT findings: none CHEST: Mild increased metabolic activity associated with the RIGHT sternal manubrium and parasternal region with a maximum SUV of 3.71, on 10/26/2019 this area showed a  maximum SUV of 4.1 and when measured on the study of April of 2021 at 3.5. Sclerosis in the area of the sternal manubrium is similar to the previous exam Uptake associated with the RIGHT chest wall with an area of soft tissue measuring approximately 3.2 cm on image 71 of series 4 is associated with maximum SUV of 3.6 as compared to 4.3 on the prior study. RIGHT retrocrural lymph node measuring 8 mm displays less than background mediastinal blood pool activity. Intensely hypermetabolic focus is seen about the RIGHT hilum (SUVmax = 10.2 (image 67 of series 4, likely a small lymph node along the infrahilar region of the RIGHT chest, perhaps approximately 6 mm, not present on the previous exam only mild uptake in this area on 10/26/2019) Incidental CT findings: No atherosclerotic changes in the thoracic aorta. Heart size is stable, top-normal. No pericardial effusion. Central pulmonary vasculature is unremarkable on noncontrast imaging. Limited assessment of cardiovascular structures given lack of intravenous contrast. Mild subpleural scarring seen along the anterior chest following previous radiotherapy to the RIGHT chest wall. Post RIGHT mastectomy as before as above. Post LEFT lumpectomy and axillary dissection. Axillary dissection changes seen bilaterally similar to the prior study. ABDOMEN/PELVIS: No abnormal hypermetabolic activity within the liver, pancreas, adrenal glands, or spleen. No hypermetabolic lymph nodes in the abdomen or pelvis. Incidental CT findings: No pericholecystic stranding. No gross biliary duct dilation. No peripancreatic stranding. Normal appearance of the spleen which is normal size. Adrenal glands and kidneys without acute process. No nephrolithiasis. No acute gastrointestinal process. Normal appendix. Normal appearance of abdominal aorta. No atherosclerotic calcification. Tubal ligation clip in the RIGHT adnexa. CT appearance of uterus and adnexa otherwise unremarkable. SKELETON: Sclerotic  areas are similar to the prior study in the RIGHT sternal manubrium and in the thoracic spine. No destructive bone process. Similar appearance of sclerosis in the RIGHT ischium. Low level uptake about the RIGHT sternoclavicular joint and associated soft tissue stranding without change since the previous study with diminished uptake since the study of November of 2020. Incidental CT findings: none IMPRESSION: 1. New area of increased activity in the RIGHT hilum with maximum SUV of 10.1 compatible with disease recurrence in this location. 2. Sclerotic foci within the sternal manubrium, thoracic spine and pelvis with similar appearance. Low level uptake is seen about the RIGHT sternum and RIGHT chest wall presumably related to post treatment changes with diminished uptake since November of 2020 within the area of the sternum and in the area of the RIGHT chest wall, attention on follow-up. 3. No activity with focal characteristics associated with the sclerotic lesions elsewhere. 4. Stable appearance of subcutaneous nodule along the soft tissues about the LEFT lateral shoulder measuring approximately 8 mm and without associated increased metabolic activity above mediastinal blood pool. Attention on follow-up. Electronically Signed   By: Zetta Bills M.D.   On: 08/24/2020 12:58    ELIGIBLE FOR AVAILABLE RESEARCH PROTOCOL: no   ASSESSMENT: 49 y.o. [place], [place] woman  (1) status post left lumpectomy in 2000 for a (?) T2N0 breast cancer,  (a) status post adjuvant chemotherapy  (b) status post adjuvant radiation  (c) did not receive antiestrogens  (2) genetics testing May 18, 2018 through the common Hereditary Cancers Panel + Myelodysplastic Syndrome/Leukemia Panel found no deleterious mutations in APC, ATM, AXIN2, BARD1, BLM, BMPR1A, BRCA1, BRCA2, BRIP1, CDH1, CDK4, CDKN2A (p14ARF), CDKN2A (p16INK4a), CEBPA, CHEK2, CTNNA1, DICER1, EPCAM*, GATA2, GREM1*, HRAS, KIT, MEN1, MLH1, MSH2, MSH3, MSH6, MUTYH, NBN,  NF1, PALB2, PDGFRA, PMS2, POLD1, POLE, PTEN, RAD50, RAD51C, RAD51D, RUNX1, SDHB, SDHC, SDHD, SMAD4, SMARCA4, STK11, TERC, TERT, TP53, TSC1, TSC2, VHL The following genes were evaluated for sequence changes only: HOXB13*, NTHL1*, SDHA  (a)  3 variants of uncertain significance were identified in the genes BARD1 c.764A>G (p.Asn255Ser), BRIP1 c.2563C>T (p.Arg855Cys), and PALB2 c.3103A>T (p.Ile1035Phe).   (3) status post right breast biopsy 03/16/2018 for a clinical mT2-3 N2, anatomic stage III invasive ductal carcinoma, grade 2, triple positive, with an MIB-1 of 80%.  (a) bone scan and chest CT scan negative for metastases except for a T6 lytic lesion of concern  (b) thoracic spine MRI was ordered May 2019 but never performed.  (4) neoadjuvant chemotherapy consisting of carboplatin, docetaxel, trastuzumab and Pertuzumab every 3 weeks x 6 starting 04/16/2018  (a) pertuzumab held beginning with cycle 2 because of diarrhea  (5) trastuzumab was to be continued Q4w indefinitely, discontinued after 10/21/2019 dose with progression  (a) echocardiogram on 06/29/2018 showed an ejection fraction in the 65-70% range  (b) echocardiogram 11/19/2018 showed an ejection fraction in the 60-65% range  (c) echo 04/07/2019 shows an ejection fraction in the 55-60% range  (d) echo 07/15/2019 shows EF of 60-65%--for additional echoes see under #12 below  (6) right mastectomy and sentinel lymph node sampling on 08/26/2018 found a residual 2.5cm invasive ductal carcinoma, with 2 of 5 sentinel lymph nodes positive, ypT2, ypN1a; margins were clear  (7) adjuvant radiation 09/30/2018 - 11/16/2018 Site/dose:   The patient initially received a dose of 50.4 Gy in 28 fractions to the right chest wall and supraclavicular region. This was delivered using a 3-D conformal, 4 field technique. The patient then received a boost to the mastectomy scar. This delivered an additional 10 Gy in 5 fractions using an en face electron field. The  total dose was 60.4   (8) anastrozole started 12/03/2018  (a) FSH on 10/01/2018 was 64.3, and estradiol 7.0, consistent with menopause  (b) referral to pelvic floor rehabilitation 12/03/2018  (c) FSH on 03/16/2020 was 80.8 with estradiol less than 2.5   METASTATIC DISEASE: March 2020 (9) CT of neck and CT angiography of the chest 02/04/2019 finds the T6 lytic lesion noted May 2019 (see #3 above) is now sclerotic; a sclerotic T3 lesion is stable; there is a new lytic lesion in the manubrium  (a) PET scan 03/12/2019 shows manubrium, T3 and T5 metastases, no visceral disease  (b) biopsy of manubrial lesion planned but not done secondary to pandemic  (c) Bone scan 09/01/19 shows ? progression of disease in spine; MRI 09/02/19 shows early metastases in T10 (new), T3 and T5 stable; CT chest 09/01/19 shows 109mm pulmonary nodules that are new, but non specific and warrant future follow up  (d) PET 10/26/2019 shows no lung or liver lesions; new mediastinal adenopathy, bone lesions-- T-DM1 started  (e) PET scan 03/08/2020 shows resolution of the mediastinal adenopathy and hypermetabolic bone lesions, questionable subcutaneous nodule along the left lateral shoulder  (f) for additional staging studies see under "12" below  (10) SRS to spine, palliative treatment to sternum, from 05/13/2019 through 05/26/2019:  1. Chest_sternum // 40 Gy in 10 fractions  2. Thoracic Spine (T3) // 18 Gy in 1  fraction   3. Thoracic Spine (T5) // 18 Gy in 1 fraction  (11) denosumab/Xgeva started 09/08/2019  (12) T-DM1 started 01/15/20201  (a) echo 10/26/2019 shows stable EF at 55-60%  (b) echo 03/08/2020 shows an ejection fraction in the 70-75% range  (c) echo 07/12/2020 shows an ejection fraction in the 65 to 70% range.  (d) PET scan 03/08/2020 shows no active disease; a 0.9 cm subcutaneous nodule in the left shoulder area seen on the PET was not identifiable by exam 03/16/2020  (e) PET 08/24/2020 shows stable disease except  for a new 0.6 cm lymph node with an SUV of 10.1  (f) bronchoscopy 09/05/2020 with endobronchial ultrasound found no detectable mass in the region noted on the September PET scan.   PLAN: [name] is now a year and a half out from definitive diagnosis of metastatic disease.  We do not have lung or liver involvement.  We do have bone and lymph node involvement.  That was stable on the most recent PET scan.  There was a question of a new hypermetabolic very small focus which was evaluated by bronchoscopic ultrasonography.  There was no mass at that exact spot and therefore likely it was a transient node.  The plan accordingly is to continue T-DM1 every 3weeks.  She will need a repeat echocardiogram in November and I have entered that order.  She will need a PET scan next March barring any other changes.  We are continuing denosumab/Xgeva every 6 weeks with a dose due today.  Depending on the PET results in March we can consider moving the T-DM1 to every 4 weeks, or backing of the treatment to Herceptin plus Perjeta every 3 to 4 weeks.  We discussed diet issues and in particular she is going to cut back on carbohydrates.  We will follow her weight in subsequent visits  Total encounter time 35 minutes.Sarajane Jews C. Fabricio Endsley, MD 09/20/20 8:58 AM Medical Oncology and Hematology [facility] [place], [place] [address] Tel. [phone]    Fax. [phone]   I, Wilburn Mylar, am acting as scribe for Dr. Virgie Dad. Orabelle Rylee.  I, Lurline Del MD, have reviewed the above documentation for accuracy and completeness, and I agree with the above.   *Total Encounter Time as defined by the Centers for Medicare and Medicaid Services includes, in addition to the face-to-face time of a patient visit (documented in the note above) non-face-to-face time: obtaining and reviewing outside history, ordering and reviewing medications, tests or procedures, care coordination  (communications with other health care professionals or caregivers) and documentation in the medical record.

## 2020-09-20 ENCOUNTER — Telehealth: Payer: Self-pay | Admitting: Oncology

## 2020-09-20 ENCOUNTER — Inpatient Hospital Stay: Payer: BC Managed Care – PPO

## 2020-09-20 ENCOUNTER — Inpatient Hospital Stay: Payer: BC Managed Care – PPO | Attending: Oncology

## 2020-09-20 ENCOUNTER — Inpatient Hospital Stay (HOSPITAL_BASED_OUTPATIENT_CLINIC_OR_DEPARTMENT_OTHER): Payer: BC Managed Care – PPO | Admitting: Oncology

## 2020-09-20 ENCOUNTER — Other Ambulatory Visit: Payer: Self-pay

## 2020-09-20 VITALS — BP 128/70 | HR 73 | Temp 97.8°F | Resp 18 | Ht 59.0 in | Wt 189.2 lb

## 2020-09-20 DIAGNOSIS — C7951 Secondary malignant neoplasm of bone: Secondary | ICD-10-CM

## 2020-09-20 DIAGNOSIS — Z170 Estrogen receptor positive status [ER+]: Secondary | ICD-10-CM | POA: Diagnosis not present

## 2020-09-20 DIAGNOSIS — Z79899 Other long term (current) drug therapy: Secondary | ICD-10-CM | POA: Insufficient documentation

## 2020-09-20 DIAGNOSIS — Z7189 Other specified counseling: Secondary | ICD-10-CM | POA: Diagnosis not present

## 2020-09-20 DIAGNOSIS — Z5112 Encounter for antineoplastic immunotherapy: Secondary | ICD-10-CM | POA: Insufficient documentation

## 2020-09-20 DIAGNOSIS — C779 Secondary and unspecified malignant neoplasm of lymph node, unspecified: Secondary | ICD-10-CM | POA: Insufficient documentation

## 2020-09-20 DIAGNOSIS — C50411 Malignant neoplasm of upper-outer quadrant of right female breast: Secondary | ICD-10-CM | POA: Diagnosis not present

## 2020-09-20 DIAGNOSIS — D696 Thrombocytopenia, unspecified: Secondary | ICD-10-CM | POA: Diagnosis not present

## 2020-09-20 DIAGNOSIS — Z95828 Presence of other vascular implants and grafts: Secondary | ICD-10-CM

## 2020-09-20 LAB — CBC WITH DIFFERENTIAL/PLATELET
Abs Immature Granulocytes: 0 10*3/uL (ref 0.00–0.07)
Basophils Absolute: 0 10*3/uL (ref 0.0–0.1)
Basophils Relative: 1 %
Eosinophils Absolute: 0.1 10*3/uL (ref 0.0–0.5)
Eosinophils Relative: 1 %
HCT: 27.6 % — ABNORMAL LOW (ref 36.0–46.0)
Hemoglobin: 8.9 g/dL — ABNORMAL LOW (ref 12.0–15.0)
Immature Granulocytes: 0 %
Lymphocytes Relative: 23 %
Lymphs Abs: 1.1 10*3/uL (ref 0.7–4.0)
MCH: 27.1 pg (ref 26.0–34.0)
MCHC: 32.2 g/dL (ref 30.0–36.0)
MCV: 83.9 fL (ref 80.0–100.0)
Monocytes Absolute: 0.7 10*3/uL (ref 0.1–1.0)
Monocytes Relative: 14 %
Neutro Abs: 3 10*3/uL (ref 1.7–7.7)
Neutrophils Relative %: 61 %
Platelets: 75 10*3/uL — ABNORMAL LOW (ref 150–400)
RBC: 3.29 MIL/uL — ABNORMAL LOW (ref 3.87–5.11)
RDW: 19.4 % — ABNORMAL HIGH (ref 11.5–15.5)
WBC: 4.9 10*3/uL (ref 4.0–10.5)
nRBC: 0 % (ref 0.0–0.2)

## 2020-09-20 LAB — COMPREHENSIVE METABOLIC PANEL
ALT: 68 U/L — ABNORMAL HIGH (ref 0–44)
AST: 97 U/L — ABNORMAL HIGH (ref 15–41)
Albumin: 3 g/dL — ABNORMAL LOW (ref 3.5–5.0)
Alkaline Phosphatase: 123 U/L (ref 38–126)
Anion gap: 8 (ref 5–15)
BUN: 39 mg/dL — ABNORMAL HIGH (ref 6–20)
CO2: 21 mmol/L — ABNORMAL LOW (ref 22–32)
Calcium: 10.4 mg/dL — ABNORMAL HIGH (ref 8.9–10.3)
Chloride: 108 mmol/L (ref 98–111)
Creatinine, Ser: 1.78 mg/dL — ABNORMAL HIGH (ref 0.44–1.00)
GFR, Estimated: 33 mL/min — ABNORMAL LOW (ref >60)
Glucose, Bld: 139 mg/dL — ABNORMAL HIGH (ref 70–99)
Potassium: 3.9 mmol/L (ref 3.5–5.1)
Sodium: 137 mmol/L (ref 135–145)
Total Bilirubin: 0.7 mg/dL (ref 0.3–1.2)
Total Protein: 7.5 g/dL (ref 6.5–8.1)

## 2020-09-20 MED ORDER — DIPHENHYDRAMINE HCL 25 MG PO CAPS
ORAL_CAPSULE | ORAL | Status: AC
  Filled 2020-09-20: qty 1

## 2020-09-20 MED ORDER — SODIUM CHLORIDE 0.9% FLUSH
10.0000 mL | INTRAVENOUS | Status: DC | PRN
  Administered 2020-09-20: 10 mL
  Filled 2020-09-20: qty 10

## 2020-09-20 MED ORDER — SODIUM CHLORIDE 0.9 % IV SOLN
3.4000 mg/kg | Freq: Once | INTRAVENOUS | Status: AC
  Administered 2020-09-20: 300 mg via INTRAVENOUS
  Filled 2020-09-20: qty 15

## 2020-09-20 MED ORDER — SODIUM CHLORIDE 0.9 % IV SOLN
Freq: Once | INTRAVENOUS | Status: AC
  Filled 2020-09-20: qty 250

## 2020-09-20 MED ORDER — ANASTROZOLE 1 MG PO TABS
1.0000 mg | ORAL_TABLET | Freq: Every day | ORAL | 4 refills | Status: DC
Start: 2020-09-20 — End: 2021-07-23

## 2020-09-20 MED ORDER — DIPHENHYDRAMINE HCL 25 MG PO CAPS
25.0000 mg | ORAL_CAPSULE | Freq: Once | ORAL | Status: AC
  Administered 2020-09-20: 25 mg via ORAL

## 2020-09-20 MED ORDER — SODIUM CHLORIDE 0.9% FLUSH
10.0000 mL | Freq: Once | INTRAVENOUS | Status: AC
  Administered 2020-09-20: 10 mL
  Filled 2020-09-20: qty 10

## 2020-09-20 MED ORDER — ACETAMINOPHEN 325 MG PO TABS
650.0000 mg | ORAL_TABLET | Freq: Once | ORAL | Status: AC
  Administered 2020-09-20: 650 mg via ORAL

## 2020-09-20 MED ORDER — ACETAMINOPHEN 325 MG PO TABS
ORAL_TABLET | ORAL | Status: AC
  Filled 2020-09-20: qty 2

## 2020-09-20 MED ORDER — HEPARIN SOD (PORK) LOCK FLUSH 100 UNIT/ML IV SOLN
500.0000 [IU] | Freq: Once | INTRAVENOUS | Status: AC | PRN
  Administered 2020-09-20: 500 [IU]
  Filled 2020-09-20: qty 5

## 2020-09-20 NOTE — Progress Notes (Signed)
Nutrition Follow-up:  Patient with metastatic triple positive breast cancer.  Patient receiving kadcyla.  Met with patient during infusion.  Patient reports that her appetite is good. She has been eating good sources of protein (chicken, [place], fish, some beef). Has been incorporating vegetables and fruits. Did not like that she had gained weight this visit.  Denies any nutrition impact symptoms    Medications: reviewed  Labs: reviewed  Anthropometrics:   Weight 189 lb stable from 9/29 184 lb on 10/5   NUTRITION DIAGNOSIS: Food and nutrition related knowledge deficit resolved   INTERVENTION:  Reviewed [employer] guidelines and provided handout.  Encouraged good sources of protein and plant foods in diet as appetite is good.  Patient has contact information and will contact RD if needed    NEXT VISIT: no follow-up RD available as needed  [name] [name] [name] [name], [place], [place] Registered Dietitian [phone] (mobile)

## 2020-09-20 NOTE — Progress Notes (Signed)
Per [name], [place] to treat despite labs. Nicole Kindred, RN and Leanne Chang Charge RN aware.

## 2020-09-20 NOTE — Telephone Encounter (Signed)
Scheduled appointment per 10/20 los. Spoke to patient who is aware of appointment dates and times.

## 2020-09-22 ENCOUNTER — Other Ambulatory Visit: Payer: Self-pay | Admitting: Oncology

## 2020-10-11 ENCOUNTER — Inpatient Hospital Stay: Payer: BC Managed Care – PPO

## 2020-10-11 ENCOUNTER — Inpatient Hospital Stay: Payer: BC Managed Care – PPO | Attending: Oncology

## 2020-10-11 ENCOUNTER — Telehealth: Payer: Self-pay

## 2020-10-11 DIAGNOSIS — Z79899 Other long term (current) drug therapy: Secondary | ICD-10-CM | POA: Insufficient documentation

## 2020-10-11 DIAGNOSIS — C50411 Malignant neoplasm of upper-outer quadrant of right female breast: Secondary | ICD-10-CM | POA: Insufficient documentation

## 2020-10-11 DIAGNOSIS — C7951 Secondary malignant neoplasm of bone: Secondary | ICD-10-CM | POA: Insufficient documentation

## 2020-10-11 DIAGNOSIS — Z170 Estrogen receptor positive status [ER+]: Secondary | ICD-10-CM | POA: Insufficient documentation

## 2020-10-11 DIAGNOSIS — Z5112 Encounter for antineoplastic immunotherapy: Secondary | ICD-10-CM | POA: Insufficient documentation

## 2020-10-11 NOTE — Telephone Encounter (Signed)
Pt called stating she would like to r/s appts she had for today as she overslept. Message sent to scheduling for them to call pt and arrange this.

## 2020-10-16 ENCOUNTER — Other Ambulatory Visit: Payer: Self-pay

## 2020-10-16 ENCOUNTER — Inpatient Hospital Stay: Payer: BC Managed Care – PPO

## 2020-10-16 VITALS — BP 103/54 | HR 63 | Temp 98.8°F | Resp 18 | Wt 189.8 lb

## 2020-10-16 DIAGNOSIS — Z170 Estrogen receptor positive status [ER+]: Secondary | ICD-10-CM

## 2020-10-16 DIAGNOSIS — Z79899 Other long term (current) drug therapy: Secondary | ICD-10-CM | POA: Diagnosis not present

## 2020-10-16 DIAGNOSIS — Z5112 Encounter for antineoplastic immunotherapy: Secondary | ICD-10-CM | POA: Diagnosis not present

## 2020-10-16 DIAGNOSIS — C7951 Secondary malignant neoplasm of bone: Secondary | ICD-10-CM

## 2020-10-16 DIAGNOSIS — C50411 Malignant neoplasm of upper-outer quadrant of right female breast: Secondary | ICD-10-CM | POA: Diagnosis not present

## 2020-10-16 DIAGNOSIS — Z95828 Presence of other vascular implants and grafts: Secondary | ICD-10-CM

## 2020-10-16 LAB — COMPREHENSIVE METABOLIC PANEL
ALT: 55 U/L — ABNORMAL HIGH (ref 0–44)
AST: 81 U/L — ABNORMAL HIGH (ref 15–41)
Albumin: 3 g/dL — ABNORMAL LOW (ref 3.5–5.0)
Alkaline Phosphatase: 118 U/L (ref 38–126)
Anion gap: 7 (ref 5–15)
BUN: 36 mg/dL — ABNORMAL HIGH (ref 6–20)
CO2: 20 mmol/L — ABNORMAL LOW (ref 22–32)
Calcium: 10.8 mg/dL — ABNORMAL HIGH (ref 8.9–10.3)
Chloride: 108 mmol/L (ref 98–111)
Creatinine, Ser: 1.75 mg/dL — ABNORMAL HIGH (ref 0.44–1.00)
GFR, Estimated: 35 mL/min — ABNORMAL LOW (ref >60)
Glucose, Bld: 127 mg/dL — ABNORMAL HIGH (ref 70–99)
Potassium: 4 mmol/L (ref 3.5–5.1)
Sodium: 135 mmol/L (ref 135–145)
Total Bilirubin: 0.8 mg/dL (ref 0.3–1.2)
Total Protein: 7.7 g/dL (ref 6.5–8.1)

## 2020-10-16 LAB — CBC WITH DIFFERENTIAL/PLATELET
Abs Immature Granulocytes: 0.01 10*3/uL (ref 0.00–0.07)
Basophils Absolute: 0 10*3/uL (ref 0.0–0.1)
Basophils Relative: 1 %
Eosinophils Absolute: 0.1 10*3/uL (ref 0.0–0.5)
Eosinophils Relative: 1 %
HCT: 26.1 % — ABNORMAL LOW (ref 36.0–46.0)
Hemoglobin: 8.5 g/dL — ABNORMAL LOW (ref 12.0–15.0)
Immature Granulocytes: 0 %
Lymphocytes Relative: 22 %
Lymphs Abs: 1.2 10*3/uL (ref 0.7–4.0)
MCH: 28 pg (ref 26.0–34.0)
MCHC: 32.6 g/dL (ref 30.0–36.0)
MCV: 85.9 fL (ref 80.0–100.0)
Monocytes Absolute: 0.7 10*3/uL (ref 0.1–1.0)
Monocytes Relative: 12 %
Neutro Abs: 3.6 10*3/uL (ref 1.7–7.7)
Neutrophils Relative %: 64 %
Platelets: 71 10*3/uL — ABNORMAL LOW (ref 150–400)
RBC: 3.04 MIL/uL — ABNORMAL LOW (ref 3.87–5.11)
RDW: 19.4 % — ABNORMAL HIGH (ref 11.5–15.5)
WBC: 5.7 10*3/uL (ref 4.0–10.5)
nRBC: 0 % (ref 0.0–0.2)

## 2020-10-16 MED ORDER — ACETAMINOPHEN 325 MG PO TABS
650.0000 mg | ORAL_TABLET | Freq: Once | ORAL | Status: AC
  Administered 2020-10-16: 650 mg via ORAL

## 2020-10-16 MED ORDER — ACETAMINOPHEN 325 MG PO TABS
ORAL_TABLET | ORAL | Status: AC
  Filled 2020-10-16: qty 2

## 2020-10-16 MED ORDER — SODIUM CHLORIDE 0.9 % IV SOLN
Freq: Once | INTRAVENOUS | Status: AC
  Filled 2020-10-16: qty 250

## 2020-10-16 MED ORDER — SODIUM CHLORIDE 0.9 % IV SOLN
3.4000 mg/kg | Freq: Once | INTRAVENOUS | Status: AC
  Administered 2020-10-16: 300 mg via INTRAVENOUS
  Filled 2020-10-16: qty 15

## 2020-10-16 MED ORDER — DENOSUMAB 120 MG/1.7ML ~~LOC~~ SOLN
SUBCUTANEOUS | Status: AC
  Filled 2020-10-16: qty 1.7

## 2020-10-16 MED ORDER — HEPARIN SOD (PORK) LOCK FLUSH 100 UNIT/ML IV SOLN
500.0000 [IU] | Freq: Once | INTRAVENOUS | Status: DC | PRN
  Filled 2020-10-16: qty 5

## 2020-10-16 MED ORDER — SODIUM CHLORIDE 0.9% FLUSH
10.0000 mL | INTRAVENOUS | Status: DC | PRN
  Administered 2020-10-16: 10 mL via INTRAVENOUS
  Filled 2020-10-16: qty 10

## 2020-10-16 MED ORDER — DIPHENHYDRAMINE HCL 25 MG PO CAPS
25.0000 mg | ORAL_CAPSULE | Freq: Once | ORAL | Status: AC
  Administered 2020-10-16: 25 mg via ORAL

## 2020-10-16 MED ORDER — DENOSUMAB 120 MG/1.7ML ~~LOC~~ SOLN
120.0000 mg | Freq: Once | SUBCUTANEOUS | Status: AC
  Administered 2020-10-16: 120 mg via SUBCUTANEOUS

## 2020-10-16 MED ORDER — DIPHENHYDRAMINE HCL 25 MG PO CAPS
ORAL_CAPSULE | ORAL | Status: AC
  Filled 2020-10-16: qty 1

## 2020-10-16 MED ORDER — SODIUM CHLORIDE 0.9% FLUSH
10.0000 mL | INTRAVENOUS | Status: DC | PRN
  Filled 2020-10-16: qty 10

## 2020-10-16 NOTE — Patient Instructions (Signed)

## 2020-10-16 NOTE — Progress Notes (Signed)
Per Dr. Lindi Adie, ok to treat with AST 81, platelets 71, and creatinine 1.75.

## 2020-10-16 NOTE — Patient Instructions (Signed)
Cancer Center Discharge Instructions for Patients Receiving Chemotherapy  Today you received the following chemotherapy agents kadcyla  To help prevent nausea and vomiting after your treatment, we encourage you to take your nausea medication as directed   If you develop nausea and vomiting that is not controlled by your nausea medication, call the clinic.   BELOW ARE SYMPTOMS THAT SHOULD BE REPORTED IMMEDIATELY:  *FEVER GREATER THAN 100.5 F  *CHILLS WITH OR WITHOUT FEVER  NAUSEA AND VOMITING THAT IS NOT CONTROLLED WITH YOUR NAUSEA MEDICATION  *UNUSUAL SHORTNESS OF BREATH  *UNUSUAL BRUISING OR BLEEDING  TENDERNESS IN MOUTH AND THROAT WITH OR WITHOUT PRESENCE OF ULCERS  *URINARY PROBLEMS  *BOWEL PROBLEMS  UNUSUAL RASH Items with * indicate a potential emergency and should be followed up as soon as possible.  Feel free to call the clinic should you have any questions or concerns. The clinic phone number is [phone].  Please show the CHEMO ALERT CARD at check-in to the Emergency Department and triage nurse.   

## 2020-10-31 ENCOUNTER — Telehealth: Payer: Self-pay | Admitting: Oncology

## 2020-10-31 NOTE — Telephone Encounter (Signed)
Scheduled appt per 11/26 sch msg - left message for patient with new appt date and time - r/s from 12/1 to 12/6

## 2020-10-31 NOTE — Progress Notes (Signed)
Confirmed w/ Dr. Jana Hakim that he is ok w/ tx early w/ Kadcyla. He will d/w pt the importance of keeping appts.  Kennith Center, Pharm.D., CPP 10/31/2020@9 :05 AM

## 2020-11-01 ENCOUNTER — Inpatient Hospital Stay: Payer: BC Managed Care – PPO

## 2020-11-01 ENCOUNTER — Inpatient Hospital Stay: Payer: BC Managed Care – PPO | Admitting: Oncology

## 2020-11-05 NOTE — Progress Notes (Signed)
[facility]  Telephone:(336) [phone] [phone]    ID: [name] [name] DOB: [birth year]  MR#: [id number]  ETS#:[id number]  Patient Care Team: Jarrett Soho, PA-C as PCP - General (Family Medicine) Jake Bathe, MD as PCP - Cardiology (Cardiology) Debanhi Blaker, Valentino Hue, MD as Consulting Physician (Oncology) Marylou Flesher, MD as Referring Physician (Internal Medicine) Terrial Rhodes, MD as Consulting Physician (Nephrology) Dorothy Puffer, MD as Consulting Physician (Radiation Oncology) Griselda Miner, MD as Consulting Physician (General Surgery) Laurey Morale, MD as Consulting Physician (Cardiology) Pershing Proud, RN as Oncology Nurse Navigator Donnelly Angelica, RN as Oncology Nurse Navigator OTHER MD:   CHIEF COMPLAINT: Triple positive breast cancer (s/p right mastectomy)  CURRENT TREATMENT: T-DM1; anastrozole; Xgeva (Q 6 w)   INTERVAL HISTORY: [name] returns today for follow-up and treatment of her triple positive breast cancer.   She continues on TDM-1 given every three weeks.  She has moderate thrombocytopenia, but no counts below 50,000 so we are continuing  She also continues on anastrozole.  She has no unusual side effects from this other than mild vaginal dryness which also of course is due to menopause  She last received denosumab/Xgeva on 10/16/2020.  We are doing this every 6 weeks.  She has no side effects from this that she is aware of.  Lakeisha's most recent echocardiogram was completed on 07/12/2020 and showed an EF of 65-70%.  She is not currently scheduled for her next echo.  Since her last visit, she has not undergone any additional studies. Her most recent PET scan was on 08/24/2020.  That showed an area in the right hilum with a high SUV, patible with new spot of activity.  She is also up to date on left mammography, most recently on 02/09/2020.   REVIEW OF SYSTEMS: [name] tells me she is pretty tired.  She would like to work but she is  going to be applying for disability.  She spends some of the day sleeping.  Part of this may be due to her taking gabapentin in the morning and she is going to change that to the evening.  More importantly though it is going to be due to her anemia.  She is not short of breath particularly and does not have chest pain or feeling like she might faint.  She actually is able to do some total body work-ups occasionally.  She tells me she would like to go back to work but she does not think that is going to be possible.  A detailed review of systems today was otherwise noncontributory   COVID 19 VACCINATION STATUS: fully vaccinated [employer]), most recently April 2021.   HISTORY OF CURRENT ILLNESS: From the original intake note:  [name] Gruenhagen palpated a lump on 01/15/2018. She felt soreness and shooting pain under her arm and in the right breast. She underwent bilateral diagnostic mammography with tomography and right breast ultrasonography at [facility] on 03/12/2018 showing: breast density category C. The is architectural distortion at the 11 o'clock position. There is also an oval mass in the right breast anterior depth. Examination of the right axilla showed enlarged lymph nodes. Ultrasonography showed a 4.6 cm mass in the right breast upper outer quadrant posterior depth. An additional 1.2 cm oval mass in the right breast 12 o'clock middle depth. The lymph nodes in the right axillary are highly suggestive of malignancy.   Accordingly on 03/16/2018 she proceeded to biopsy of the right breast area in question. The pathology from this  procedure showed ([id number]): At both the 11 and 12 o'clock positions: Invasive ductal carcinoma grade II. Ductal carcinoma in situ, high grade, with necrosis and calcifications. Prognostic indicators significant for: estrogen receptor, 90% positive and progesterone receptor, 40% positive, both with strong staining intensity. Proliferation marker Ki67 at 80%. HER2 amplified with  ratios HER2/CEP17 SIGNALS 6.90 and average HER2 copies per cell 14.50  The patient's subsequent history is as detailed below.   PAST MEDICAL HISTORY: Past Medical History:  Diagnosis Date  . Anxiety   . Breast cancer, left breast ([place]) 2000   Underwent lumpectomy, chemotherapy and radiation  . Facial paralysis/Bells palsy    left side  . Family history of lung cancer   . Family history of lymphoma   . Hypertension   . Neuropathy    IN FINGERS AND TOES   RECENT INFUSION OF CHEMO  . Personal history of breast cancer 05/06/2018  . Renal disorder    just after TIA acute kidney injury  . TIA (transient ischemic attack) 03/16/2017   [facility]   The patient has a previous history of left breast cancer diagnosed in 2000. She was seen by [facility] in [place], [place] under Dr. Loreta Ave. According to the patient, her left breast cancer was stage II with no lymph node involvement (likely T2N0). She had chemotherapy every 3 weeks for about 6 months. She did not takes any "red drugs." She also did not take anti-estrogens (likely ER/PR negative).   TIA in 2018. She saw a neurologist as a precaution. She denies any clotting issues.    PAST SURGICAL HISTORY: Past Surgical History:  Procedure Laterality Date  . BREAST LUMPECTOMY WITH AXILLARY LYMPH NODE BIOPSY Left 2000   biopsy  . BREAST SURGERY     partial mastectomy with lymph node removal  . ENDOBRONCHIAL ULTRASOUND Bilateral 09/05/2020   Procedure: ENDOBRONCHIAL ULTRASOUND;  Surgeon: Collene Gobble, MD;  Location: [facility];  Service: Cardiopulmonary;  Laterality: Bilateral;  . MASTECTOMY Right 2019   & partial mastectomy left breast 2000  . MASTECTOMY MODIFIED RADICAL Right 08/26/2018   Procedure: MASTECTOMY MODIFIED RADICAL;  Surgeon: Jovita Kussmaul, MD;  Location: [place];  Service: General;  Laterality: Right;  . MODIFIED MASTECTOMY Right 08/26/2018  . PORTACATH PLACEMENT Left 04/08/2018   Procedure:  INSERTION PORT-A-CATH;  Surgeon: Jovita Kussmaul, MD;  Location: [place];  Service: General;  Laterality: Left;  . TUBAL LIGATION Bilateral 2004  . VIDEO BRONCHOSCOPY N/A 09/05/2020   Procedure: VIDEO BRONCHOSCOPY WITHOUT FLUORO;  Surgeon: Collene Gobble, MD;  Location: [facility];  Service: Cardiopulmonary;  Laterality: N/A;  . WISDOM TOOTH EXTRACTION      FAMILY HISTORY Family History  Problem Relation Age of Onset  . Hypertension Mother   . Hypertension Father   . Diverticulosis Father   . Diabetes Father   . Lung cancer Father 25       hx smoking  . Hypertension Maternal Grandmother   . CAD Maternal Grandmother   . Alzheimer's disease Maternal Grandmother        died at 37  . Hypertension Paternal Grandmother   . CAD Paternal Grandmother   . Stroke Paternal Grandfather   . Sickle cell trait Paternal Grandfather   . Pneumonia Maternal Aunt        died at a young adult  . Lymphoma Paternal Aunt 1   The patient's father is alive at age 87 and had a history of lung cancer. The patient's mother  is alive at age 65. The patient has 1 brother and no sisters. She denies a family history of breast or ovarian cancer.     GYNECOLOGIC HISTORY:  Patient's last menstrual period was 12/21/2017. Menarche: 49 years old Age at first live birth: 49 years old She is GXP3. She is no longer having periods, which were irregular and light. Her LMP was  January 2019. She is not having hot flashes. The patient used oral contraceptive from [phone] and the Depo-provera shot from [phone] with no complications. She never used HRT.    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  She also worked at [employer] as a Occupational psychologist.  Her husband, [name], is a Administrator. The patient's son, Shea Stakes age 32, works at [employer] in [place]. The patient's daughters, Lilia Pro age 65, and Levonne Spiller age 62, are students. The patient's adopted son, Vonna Kotyk age 39, is also a Ship broker.     ADVANCED DIRECTIVES: In the absence of any documents to the contrary the patient's husband is her healthcare power of attorney   HEALTH MAINTENANCE: Social History   Tobacco Use  . Smoking status: Never Smoker  . Smokeless tobacco: Never Used  Vaping Use  . Vaping Use: Never used  Substance Use Topics  . Alcohol use: No  . Drug use: No     Colonoscopy: n/a  PAP: 2015  Bone density: never done   Allergies  Allergen Reactions  . Ace Inhibitors Swelling    Swelling of lips and face  . Lisinopril Swelling    Swelling of lips, face  . Amlodipine Swelling    Leg swelling  . Shellfish Allergy Swelling  . Adhesive [Tape] Itching and Rash    Please use paper tape  . Latex Itching and Rash    Current Outpatient Medications  Medication Sig Dispense Refill  . acyclovir (ZOVIRAX) 400 MG tablet TAKE 1 TABLET BY MOUTH TWICE A DAY 180 tablet 1  . anastrozole (ARIMIDEX) 1 MG tablet Take 1 tablet (1 mg total) by mouth daily. 90 tablet 4  . Ascorbic Acid (VITAMIN C) 1000 MG tablet Take 1,000 mg by mouth daily.    . carvedilol (COREG) 3.125 MG tablet TAKE 1 TABLET (3.125 MG TOTAL) BY MOUTH 2 (TWO) TIMES DAILY WITH A MEAL. 180 tablet 2  . Cholecalciferol (VITAMIN D3 PO) Take by mouth daily.    . Cyanocobalamin (VITAMIN B12) 500 MCG TABS Take 500 mcg by mouth daily.     . ferrous sulfate 325 (65 FE) MG tablet Take 1 tablet (325 mg total) by mouth daily. 30 tablet 0  . lidocaine (XYLOCAINE) 2 % solution Use as directed 5 mLs in the mouth or throat as needed for mouth pain. Swish and spit 200 mL 1  . lidocaine-prilocaine (EMLA) cream Apply 1 application topically as needed. 30 g 6  . losartan (COZAAR) 50 MG tablet Take 1 tablet (50 mg total) by mouth daily. 30 tablet 11  . Multiple Vitamin (MULITIVITAMIN WITH MINERALS) TABS Take 1 tablet by mouth daily.      . prochlorperazine (COMPAZINE) 10 MG tablet Take 1 tablet (10 mg total) by mouth every 6 (six) hours as needed (Nausea or vomiting).  60 tablet 1  . spironolactone-hydrochlorothiazide (ALDACTAZIDE) 25-25 MG tablet TAKE 1 TABLET BY MOUTH EVERY DAY 30 tablet 2  . venlafaxine XR (EFFEXOR-XR) 37.5 MG 24 hr capsule TAKE 1 CAPSULE BY MOUTH DAILY WITH BREAKFAST. 90 capsule 4  . zinc gluconate 50  MG tablet Take 50 mg by mouth daily.     No current facility-administered medications for this visit.   Facility-Administered Medications Ordered in Other Visits  Medication Dose Route Frequency Provider Last Rate Last Admin  . ado-trastuzumab emtansine (KADCYLA) 300 mg in sodium chloride 0.9 % 250 mL chemo infusion  3.4 mg/kg (Treatment Plan Recorded) Intravenous Once Richad Ramsay, Virgie Dad, MD 530 mL/hr at 11/06/20 1538 300 mg at 11/06/20 1538  . heparin lock flush 100 unit/mL  500 Units Intracatheter Once PRN Brendan Gruwell, Virgie Dad, MD      . sodium chloride flush (NS) 0.9 % injection 10 mL  10 mL Intracatheter PRN Shayna Eblen, Virgie Dad, MD        OBJECTIVE:  African-American woman who appears stated age  Vitals:   11/06/20 1330  BP: (!) 103/58  Pulse: 68  Resp: 18  Temp: 97.7 F (36.5 C)  SpO2: 100%   Wt Readings from Last 3 Encounters:  11/06/20 181 lb 1.6 oz (82.1 kg)  10/16/20 189 lb 12 oz (86.1 kg)  09/20/20 189 lb 3.2 oz (85.8 kg)   Body mass index is 36.58 kg/m.    ECOG FS:1 - Symptomatic but completely ambulatory  Sclerae unicteric, EOMs intact Wearing a mask No cervical or supraclavicular adenopathy Lungs no rales or rhonchi Heart regular rate and rhythm Abd soft, nontender, positive bowel sounds MSK no focal spinal tenderness, no upper extremity lymphedema Neuro: nonfocal, well oriented, appropriate affect Breasts: The right breast is status post mastectomy.  There is no evidence of chest wall recurrence.  The left breast is status post lumpectomy.  There is no evidence of local recurrence.  Both axillae are benign.   LAB RESULTS:  CMP     Component Value Date/Time   NA 137 11/06/2020 1313   K 4.5 11/06/2020  1313   CL 110 11/06/2020 1313   CO2 22 11/06/2020 1313   GLUCOSE 96 11/06/2020 1313   BUN 31 (H) 11/06/2020 1313   CREATININE 1.96 (H) 11/06/2020 1313   CREATININE 1.56 (H) 07/31/2020 1400   CALCIUM 10.4 (H) 11/06/2020 1313   CALCIUM 11.2 (H) 02/24/2020 0830   PROT 7.8 11/06/2020 1313   ALBUMIN 3.0 (L) 11/06/2020 1313   AST 90 (H) 11/06/2020 1313   AST 134 (H) 07/31/2020 1400   ALT 57 (H) 11/06/2020 1313   ALT 81 (H) 07/31/2020 1400   ALKPHOS 115 11/06/2020 1313   BILITOT 0.7 11/06/2020 1313   BILITOT 0.9 07/31/2020 1400   GFRNONAA 31 (L) 11/06/2020 1313   GFRNONAA 39 (L) 07/31/2020 1400   GFRAA 41 (L) 08/30/2020 0808   GFRAA 45 (L) 07/31/2020 1400    Lab Results  Component Value Date   TOTALPROTELP 7.0 02/25/2019     Lab Results  Component Value Date   KPAFRELGTCHN 76.7 (H) 02/25/2019   LAMBDASER 27.2 (H) 02/25/2019   KAPLAMBRATIO 2.82 (H) 02/25/2019    Lab Results  Component Value Date   WBC 4.8 11/06/2020   NEUTROABS 2.6 11/06/2020   HGB 8.0 (L) 11/06/2020   HCT 24.3 (L) 11/06/2020   MCV 86.2 11/06/2020   PLT 80 (L) 11/06/2020   No results found for: LABCA2  No components found for: [id number]  No results for input(s): INR in the last 168 hours.  No results found for: LABCA2  No results found for: IDP824  No results found for: CAN125  No results found for: MPN361  Lab Results  Component Value Date   CA2729 38.7 (H) 02/25/2019  No components found for: HGQUANT  No results found for: CEA1 / No results found for: CEA1   No results found for: AFPTUMOR  No results found for: CHROMOGRNA  No results found for: HGBA, HGBA2QUANT, HGBFQUANT, HGBSQUAN (Hemoglobinopathy evaluation)   No results found for: LDH  No results found for: IRON, TIBC, IRONPCTSAT (Iron and TIBC)  No results found for: FERRITIN  Urinalysis    Component Value Date/Time   COLORURINE YELLOW 08/18/2019 1420   APPEARANCEUR CLEAR 08/18/2019 1420   LABSPEC 1.016  08/18/2019 1420   PHURINE 5.0 08/18/2019 1420   GLUCOSEU NEGATIVE 08/18/2019 1420   HGBUR NEGATIVE 08/18/2019 1420   BILIRUBINUR NEGATIVE 08/18/2019 1420   KETONESUR NEGATIVE 08/18/2019 1420   PROTEINUR NEGATIVE 08/18/2019 1420   UROBILINOGEN 0.2 11/29/2011 1656   NITRITE NEGATIVE 08/18/2019 1420   LEUKOCYTESUR SMALL (A) 08/18/2019 1420    STUDIES: No results found.  ELIGIBLE FOR AVAILABLE RESEARCH PROTOCOL: no   ASSESSMENT: 49 y.o. [place], [place] woman  (1) status post left lumpectomy in 2000 for a (?) T2N0 breast cancer,  (a) status post adjuvant chemotherapy  (b) status post adjuvant radiation  (c) did not receive antiestrogens  (2) genetics testing May 18, 2018 through the common Hereditary Cancers Panel + Myelodysplastic Syndrome/Leukemia Panel found no deleterious mutations in APC, ATM, AXIN2, BARD1, BLM, BMPR1A, BRCA1, BRCA2, BRIP1, CDH1, CDK4, CDKN2A (p14ARF), CDKN2A (p16INK4a), CEBPA, CHEK2, CTNNA1, DICER1, EPCAM*, GATA2, GREM1*, HRAS, KIT, MEN1, MLH1, MSH2, MSH3, MSH6, MUTYH, NBN, NF1, PALB2, PDGFRA, PMS2, POLD1, POLE, PTEN, RAD50, RAD51C, RAD51D, RUNX1, SDHB, SDHC, SDHD, SMAD4, SMARCA4, STK11, TERC, TERT, TP53, TSC1, TSC2, VHL The following genes were evaluated for sequence changes only: HOXB13*, NTHL1*, SDHA  (a)  3 variants of uncertain significance were identified in the genes BARD1 c.764A>G (p.Asn255Ser), BRIP1 c.2563C>T (p.Arg855Cys), and PALB2 c.3103A>T (p.Ile1035Phe).   (3) status post right breast biopsy 03/16/2018 for a clinical mT2-3 N2, anatomic stage III invasive ductal carcinoma, grade 2, triple positive, with an MIB-1 of 80%.  (a) bone scan and chest CT scan negative for metastases except for a T6 lytic lesion of concern  (b) thoracic spine MRI was ordered May 2019 but never performed.  (4) neoadjuvant chemotherapy consisting of carboplatin, docetaxel, trastuzumab and Pertuzumab every 3 weeks x 6 starting 04/16/2018  (a) pertuzumab held beginning with cycle 2  because of diarrhea  (5) trastuzumab was to be continued Q4w indefinitely, discontinued after 10/21/2019 dose with progression  (a) echocardiogram on 06/29/2018 showed an ejection fraction in the 65-70% range  (b) echocardiogram 11/19/2018 showed an ejection fraction in the 60-65% range  (c) echo 04/07/2019 shows an ejection fraction in the 55-60% range  (d) echo 07/15/2019 shows EF of 60-65%--for additional echoes see under #12 below  (6) right mastectomy and sentinel lymph node sampling on 08/26/2018 found a residual 2.5cm invasive ductal carcinoma, with 2 of 5 sentinel lymph nodes positive, ypT2, ypN1a; margins were clear  (7) adjuvant radiation 09/30/2018 - 11/16/2018 Site/dose:   The patient initially received a dose of 50.4 Gy in 28 fractions to the right chest wall and supraclavicular region. This was delivered using a 3-D conformal, 4 field technique. The patient then received a boost to the mastectomy scar. This delivered an additional 10 Gy in 5 fractions using an en face electron field. The total dose was 60.4   (8) anastrozole started 12/03/2018  (a) FSH on 10/01/2018 was 64.3, and estradiol 7.0, consistent with menopause  (b) referral to pelvic floor rehabilitation 12/03/2018  (c) FSH on  03/16/2020 was 80.8 with estradiol less than 2.5   METASTATIC DISEASE: March 2020 (9) CT of neck and CT angiography of the chest 02/04/2019 finds the T6 lytic lesion noted May 2019 (see #3 above) is now sclerotic; a sclerotic T3 lesion is stable; there is a new lytic lesion in the manubrium  (a) PET scan 03/12/2019 shows manubrium, T3 and T5 metastases, no visceral disease  (b) biopsy of manubrial lesion planned but not done secondary to pandemic  (c) Bone scan 09/01/19 shows ? progression of disease in spine; MRI 09/02/19 shows early metastases in T10 (new), T3 and T5 stable; CT chest 09/01/19 shows 17mm pulmonary nodules that are new, but non specific and warrant future follow up  (d) PET  10/26/2019 shows no lung or liver lesions; new mediastinal adenopathy, bone lesions-- T-DM1 started  (e) PET scan 03/08/2020 shows resolution of the mediastinal adenopathy and hypermetabolic bone lesions, questionable subcutaneous nodule along the left lateral shoulder  (f) for additional staging studies see under "12" below  (10) SRS to spine, palliative treatment to sternum, from 05/13/2019 through 05/26/2019:  1. Chest_sternum // 40 Gy in 10 fractions  2. Thoracic Spine (T3) // 18 Gy in 1 fraction   3. Thoracic Spine (T5) // 18 Gy in 1 fraction  (11) denosumab/Xgeva started 09/08/2019  (12) T-DM1 started 01/15/20201  (a) echo 10/26/2019 shows stable EF at 55-60%  (b) echo 03/08/2020 shows an ejection fraction in the 70-75% range  (c) echo 07/12/2020 shows an ejection fraction in the 65 to 70% range.  (d) PET scan 03/08/2020 shows no active disease; a 0.9 cm subcutaneous nodule in the left shoulder area seen on the PET was not identifiable by exam 03/16/2020  (e) PET 08/24/2020 shows stable disease except for a new 0.6 cm lymph node with an SUV of 10.1  (f) bronchoscopy 09/05/2020 with endobronchial ultrasound found no detectable mass in the region noted on the September PET scan.   PLAN: [place] is now a little over a year and a half out from definitive diagnosis of metastatic breast cancer.  Generally she is tolerating treatment well.  And the plan is to continue T-DM1 for now.  Some of the symptomatology she is exhibiting is due to anemia.  This is going to be due to her advanced kidney dysfunction.  She will benefit from Retacrit.  I have entered the order so she can start that with her next T-DM1 treatment which will be 11/29/2020.  She will then see me 3 weeks later.  We are planning to restage her in March.  Depending on what we find there we may want to go back from T-DM1 to perhaps trastuzumab and Pertuzumab or on the other hand we may go forward to [place]  I commended her  excellent exercise program.  She knows to call for any other issue that may develop before the next visit  Total encounter time 40 minutes.  is now a year and a half out from definitive diagnosis of metastatic disease.  We do not have lung or liver involvement.  We do have bone and lymph node involvement.  That was stable on the most recent PET scan.  There was a question of a new hypermetabolic very small focus which was evaluated by bronchoscopic ultrasonography.  There was no mass at that exact spot and therefore likely it was a transient node.  The plan accordingly is to continue T-DM1 every 3weeks.  She will need a repeat echocardiogram in November and I  have entered that order.  She will need a PET scan next March barring any other changes.  We are continuing denosumab/Xgeva every 6 weeks with a dose due today.  Depending on the PET results in March we can consider moving the T-DM1 to every 4 weeks, or backing of the treatment to Herceptin plus Perjeta every 3 to 4 weeks.  We discussed diet issues and in particular she is going to cut back on carbohydrates.  We will follow her weight in subsequent visits  Total encounter time 35 minutes.Sarajane Jews C. Cinthya Bors, MD 11/06/20 4:02 PM Medical Oncology and Hematology [facility] [place], [place] [address] Tel. [phone]    Fax. [phone]   I, Wilburn Mylar, am acting as scribe for Dr. Virgie Dad. Ashton Belote.  I, Lurline Del MD, have reviewed the above documentation for accuracy and completeness, and I agree with the above.   *Total Encounter Time as defined by the Centers for Medicare and Medicaid Services includes, in addition to the face-to-face time of a patient visit (documented in the note above) non-face-to-face time: obtaining and reviewing outside history, ordering and reviewing medications, tests or procedures, care coordination (communications with other health care professionals or  caregivers) and documentation in the medical record.

## 2020-11-06 ENCOUNTER — Inpatient Hospital Stay: Payer: BC Managed Care – PPO

## 2020-11-06 ENCOUNTER — Inpatient Hospital Stay: Payer: BC Managed Care – PPO | Attending: Oncology

## 2020-11-06 ENCOUNTER — Other Ambulatory Visit: Payer: Self-pay | Admitting: Oncology

## 2020-11-06 ENCOUNTER — Inpatient Hospital Stay (HOSPITAL_BASED_OUTPATIENT_CLINIC_OR_DEPARTMENT_OTHER): Payer: BC Managed Care – PPO | Admitting: Oncology

## 2020-11-06 ENCOUNTER — Other Ambulatory Visit: Payer: Self-pay

## 2020-11-06 VITALS — BP 103/58 | HR 68 | Temp 97.7°F | Resp 18 | Ht 59.0 in | Wt 181.1 lb

## 2020-11-06 VITALS — BP 102/36 | HR 67 | Temp 97.9°F | Resp 16

## 2020-11-06 DIAGNOSIS — Z79899 Other long term (current) drug therapy: Secondary | ICD-10-CM | POA: Insufficient documentation

## 2020-11-06 DIAGNOSIS — Z5112 Encounter for antineoplastic immunotherapy: Secondary | ICD-10-CM | POA: Diagnosis not present

## 2020-11-06 DIAGNOSIS — C50411 Malignant neoplasm of upper-outer quadrant of right female breast: Secondary | ICD-10-CM | POA: Diagnosis not present

## 2020-11-06 DIAGNOSIS — D631 Anemia in chronic kidney disease: Secondary | ICD-10-CM | POA: Diagnosis not present

## 2020-11-06 DIAGNOSIS — Z170 Estrogen receptor positive status [ER+]: Secondary | ICD-10-CM | POA: Insufficient documentation

## 2020-11-06 DIAGNOSIS — C7951 Secondary malignant neoplasm of bone: Secondary | ICD-10-CM

## 2020-11-06 DIAGNOSIS — N184 Chronic kidney disease, stage 4 (severe): Secondary | ICD-10-CM | POA: Diagnosis not present

## 2020-11-06 DIAGNOSIS — C50811 Malignant neoplasm of overlapping sites of right female breast: Secondary | ICD-10-CM | POA: Insufficient documentation

## 2020-11-06 DIAGNOSIS — Z79811 Long term (current) use of aromatase inhibitors: Secondary | ICD-10-CM | POA: Diagnosis not present

## 2020-11-06 LAB — COMPREHENSIVE METABOLIC PANEL
ALT: 57 U/L — ABNORMAL HIGH (ref 0–44)
AST: 90 U/L — ABNORMAL HIGH (ref 15–41)
Albumin: 3 g/dL — ABNORMAL LOW (ref 3.5–5.0)
Alkaline Phosphatase: 115 U/L (ref 38–126)
Anion gap: 5 (ref 5–15)
BUN: 31 mg/dL — ABNORMAL HIGH (ref 6–20)
CO2: 22 mmol/L (ref 22–32)
Calcium: 10.4 mg/dL — ABNORMAL HIGH (ref 8.9–10.3)
Chloride: 110 mmol/L (ref 98–111)
Creatinine, Ser: 1.96 mg/dL — ABNORMAL HIGH (ref 0.44–1.00)
GFR, Estimated: 31 mL/min — ABNORMAL LOW (ref >60)
Glucose, Bld: 96 mg/dL (ref 70–99)
Potassium: 4.5 mmol/L (ref 3.5–5.1)
Sodium: 137 mmol/L (ref 135–145)
Total Bilirubin: 0.7 mg/dL (ref 0.3–1.2)
Total Protein: 7.8 g/dL (ref 6.5–8.1)

## 2020-11-06 LAB — CBC WITH DIFFERENTIAL/PLATELET
Abs Immature Granulocytes: 0.01 10*3/uL (ref 0.00–0.07)
Basophils Absolute: 0 10*3/uL (ref 0.0–0.1)
Basophils Relative: 1 %
Eosinophils Absolute: 0.1 10*3/uL (ref 0.0–0.5)
Eosinophils Relative: 2 %
HCT: 24.3 % — ABNORMAL LOW (ref 36.0–46.0)
Hemoglobin: 8 g/dL — ABNORMAL LOW (ref 12.0–15.0)
Immature Granulocytes: 0 %
Lymphocytes Relative: 25 %
Lymphs Abs: 1.2 10*3/uL (ref 0.7–4.0)
MCH: 28.4 pg (ref 26.0–34.0)
MCHC: 32.9 g/dL (ref 30.0–36.0)
MCV: 86.2 fL (ref 80.0–100.0)
Monocytes Absolute: 0.9 10*3/uL (ref 0.1–1.0)
Monocytes Relative: 18 %
Neutro Abs: 2.6 10*3/uL (ref 1.7–7.7)
Neutrophils Relative %: 54 %
Platelets: 80 10*3/uL — ABNORMAL LOW (ref 150–400)
RBC: 2.82 MIL/uL — ABNORMAL LOW (ref 3.87–5.11)
RDW: 19.3 % — ABNORMAL HIGH (ref 11.5–15.5)
WBC: 4.8 10*3/uL (ref 4.0–10.5)
nRBC: 0 % (ref 0.0–0.2)

## 2020-11-06 MED ORDER — SODIUM CHLORIDE 0.9 % IV SOLN
3.4000 mg/kg | Freq: Once | INTRAVENOUS | Status: AC
  Administered 2020-11-06: 300 mg via INTRAVENOUS
  Filled 2020-11-06: qty 15

## 2020-11-06 MED ORDER — HEPARIN SOD (PORK) LOCK FLUSH 100 UNIT/ML IV SOLN
500.0000 [IU] | Freq: Once | INTRAVENOUS | Status: DC | PRN
  Filled 2020-11-06: qty 5

## 2020-11-06 MED ORDER — ACETAMINOPHEN 325 MG PO TABS
ORAL_TABLET | ORAL | Status: AC
  Filled 2020-11-06: qty 2

## 2020-11-06 MED ORDER — LIDOCAINE-PRILOCAINE 2.5-2.5 % EX CREA: 1.0000 "application " | TOPICAL_CREAM | CUTANEOUS | 6 refills | Status: DC | PRN

## 2020-11-06 MED ORDER — SODIUM CHLORIDE 0.9% FLUSH
10.0000 mL | INTRAVENOUS | Status: DC | PRN
  Filled 2020-11-06: qty 10

## 2020-11-06 MED ORDER — SODIUM CHLORIDE 0.9 % IV SOLN
Freq: Once | INTRAVENOUS | Status: AC
  Filled 2020-11-06: qty 250

## 2020-11-06 MED ORDER — DIPHENHYDRAMINE HCL 25 MG PO CAPS
ORAL_CAPSULE | ORAL | Status: AC
  Filled 2020-11-06: qty 1

## 2020-11-06 MED ORDER — ACETAMINOPHEN 325 MG PO TABS
650.0000 mg | ORAL_TABLET | Freq: Once | ORAL | Status: AC
  Administered 2020-11-06: 650 mg via ORAL

## 2020-11-06 MED ORDER — SODIUM CHLORIDE 0.9% FLUSH
10.0000 mL | Freq: Once | INTRAVENOUS | Status: AC
  Administered 2020-11-06: 10 mL via INTRAVENOUS
  Filled 2020-11-06: qty 10

## 2020-11-06 MED ORDER — DIPHENHYDRAMINE HCL 25 MG PO CAPS
25.0000 mg | ORAL_CAPSULE | Freq: Once | ORAL | Status: AC
  Administered 2020-11-06: 25 mg via ORAL

## 2020-11-06 NOTE — Progress Notes (Signed)
Addendum to today's note: Can you has had a year of T-DM1.  She is having problems with platelets and also some renal insufficiency.  I am going to switch her to Herceptin beginning with the next cycle later this year.  She will be restaged in March.  I called [place] and left her voicemail with this information.

## 2020-11-06 NOTE — Patient Instructions (Signed)
[facility] Discharge Instructions for Patients Receiving Chemotherapy  Today you received the following chemotherapy agents kadcyla  To help prevent nausea and vomiting after your treatment, we encourage you to take your nausea medication as directed   If you develop nausea and vomiting that is not controlled by your nausea medication, call the clinic.   BELOW ARE SYMPTOMS THAT SHOULD BE REPORTED IMMEDIATELY:  *FEVER GREATER THAN 100.5 F  *CHILLS WITH OR WITHOUT FEVER  NAUSEA AND VOMITING THAT IS NOT CONTROLLED WITH YOUR NAUSEA MEDICATION  *UNUSUAL SHORTNESS OF BREATH  *UNUSUAL BRUISING OR BLEEDING  TENDERNESS IN MOUTH AND THROAT WITH OR WITHOUT PRESENCE OF ULCERS  *URINARY PROBLEMS  *BOWEL PROBLEMS  UNUSUAL RASH Items with * indicate a potential emergency and should be followed up as soon as possible.  Feel free to call the clinic should you have any questions or concerns. The clinic phone number is [phone].  Please show the CHEMO ALERT CARD at check-in to the Emergency Department and triage nurse.   

## 2020-11-06 NOTE — Progress Notes (Signed)
Okay to proceed with treatment today with lab results of platelets 80, creatinine 1.96, and AST of 90 per Dr. Jana Hakim.

## 2020-11-06 NOTE — Patient Instructions (Signed)

## 2020-11-08 ENCOUNTER — Telehealth: Payer: Self-pay | Admitting: Oncology

## 2020-11-08 NOTE — Telephone Encounter (Signed)
Scheduled follow up with GM per 12/6 los in between appts already on pt's schedule. Made to changes to pt's original arrival time.

## 2020-11-10 ENCOUNTER — Ambulatory Visit (HOSPITAL_BASED_OUTPATIENT_CLINIC_OR_DEPARTMENT_OTHER)
Admission: RE | Admit: 2020-11-10 | Discharge: 2020-11-10 | Disposition: A | Discharge status: Home / Self Care | Payer: BC Managed Care – PPO | Source: Ambulatory Visit | Attending: Internal Medicine | Admitting: Internal Medicine

## 2020-11-10 ENCOUNTER — Encounter (HOSPITAL_COMMUNITY): Payer: Self-pay | Admitting: Cardiology

## 2020-11-10 ENCOUNTER — Ambulatory Visit (HOSPITAL_COMMUNITY)
Admission: RE | Admit: 2020-11-10 | Discharge: 2020-11-10 | Disposition: A | Discharge status: Home / Self Care | Payer: BC Managed Care – PPO | Source: Ambulatory Visit | Attending: Internal Medicine | Admitting: Internal Medicine

## 2020-11-10 ENCOUNTER — Other Ambulatory Visit: Payer: Self-pay

## 2020-11-10 ENCOUNTER — Encounter (HOSPITAL_COMMUNITY): Payer: Self-pay | Admitting: Internal Medicine

## 2020-11-10 VITALS — BP 144/78 | HR 66 | Wt 182.2 lb

## 2020-11-10 DIAGNOSIS — Z170 Estrogen receptor positive status [ER+]: Secondary | ICD-10-CM

## 2020-11-10 DIAGNOSIS — I10 Essential (primary) hypertension: Secondary | ICD-10-CM

## 2020-11-10 DIAGNOSIS — Z0189 Encounter for other specified special examinations: Secondary | ICD-10-CM | POA: Diagnosis not present

## 2020-11-10 DIAGNOSIS — C7951 Secondary malignant neoplasm of bone: Secondary | ICD-10-CM

## 2020-11-10 DIAGNOSIS — Z79899 Other long term (current) drug therapy: Secondary | ICD-10-CM | POA: Diagnosis not present

## 2020-11-10 DIAGNOSIS — Z7189 Other specified counseling: Secondary | ICD-10-CM

## 2020-11-10 DIAGNOSIS — Z5181 Encounter for therapeutic drug level monitoring: Secondary | ICD-10-CM | POA: Insufficient documentation

## 2020-11-10 DIAGNOSIS — C50411 Malignant neoplasm of upper-outer quadrant of right female breast: Secondary | ICD-10-CM

## 2020-11-10 DIAGNOSIS — Z8673 Personal history of transient ischemic attack (TIA), and cerebral infarction without residual deficits: Secondary | ICD-10-CM | POA: Diagnosis not present

## 2020-11-10 LAB — ECHOCARDIOGRAM COMPLETE
Area-P 1/2: 2.91 cm2
Calc EF: 62.9 %
S' Lateral: 3.2 cm
Single Plane A2C EF: 64.1 %
Single Plane A4C EF: 60.7 %

## 2020-11-10 MED ORDER — CARVEDILOL 3.125 MG PO TABS: 3.1250 mg | ORAL_TABLET | Freq: Two times a day (BID) | ORAL | 2 refills | Status: DC

## 2020-11-10 NOTE — Progress Notes (Signed)
Cardio-Oncology Clinic Note   Date:  11/10/2020   ID:  Bur[place] Gullatt, DOB [birth year], MRN [id number]  Location: Home  Provider location: [facility] Type of Visit: Established patient  PCP:  Marda Stalker, PA-C  Cardiologist:  Candee Furbish, MD Primary HF: Ceaser Ebeling  Chief Complaint: Cardio-oncology  follow-up   History of Present Illness:  HPI: Ms. Maiorana is45 y.o.female with h/o HTN, TIA, anxiety and left breast cancer (treated in 2000 at [facility]) who was diagnosed with triple-positive R breast cancer in 4/19. Referred by Dr. Jana Hakim for enrollment into the Cardio-Oncology program.  Cancer history/treatment plan:  (1) status post left lumpectomy in 2000 for a (?) T2N0 breast cancer, (a) status post adjuvant chemotherapy including adriamycin (b) status post adjuvant radiation (c) did not receive antiestrogens  (2) status post right breast biopsy 03/16/2018 for a clinical mT2-3 N2, anatomic stage III invasive ductal carcinoma, grade 2, triple positive, with an MIB-1 of 80%. (a) bone scan and chest CT scan negative for metastases except for a thoracic spine lytic lesion of concern  (3) neoadjuvant chemotherapy consisting of carboplatin, docetaxel, trastuzumab and Pertuzumab every 3 weeks x 6 starting 04/16/2018 (a) pertuzumab held beginning with cycle 2 because of diarrhea  (4) trastuzumab to be continuedindefinitely  (5)9/25 s/p surgical resection  (6) adjuvant radiation to the right breastcompleted  (7) antiestrogens to start at the completion of local treatment   She presents for routine f/u. Struggling with Kadcyla due to AKI and anemia. About to be switched back to Herceptin. Denies edema, orthopnea or PND.  PET scan 9/21 New area of increased activity in the RIGHT hilum with maximum. SUV of 10.1 compatible with disease recurrence in this location.   Echo  today 11/10/20 EF 60-65% GLS -23.2 Personally reviewed   Echo 07/12/20: EF 65-70% GLS -23.7%  Echo 03/08/20 EF 65-70%  Echo 11/20 EF 60-65% GLS -18.7%  Echo 04/07/19 EF 60-65% GLS -17.7%  Echo 12/19 EF 60% GLS -23% Echo 06/29/18 EF 65-70% GLS -20.6% LS 9.5 cm/s  Echo 4/19 EF 60-65% mild LVH Grade II DD    Past Medical History:  Diagnosis Date  . Anxiety   . Breast cancer, left breast ([place]) 2000   Underwent lumpectomy, chemotherapy and radiation  . Facial paralysis/Bells palsy    left side  . Family history of lung cancer   . Family history of lymphoma   . Hypertension   . Neuropathy    IN FINGERS AND TOES   RECENT INFUSION OF CHEMO  . Personal history of breast cancer 05/06/2018  . Renal disorder    just after TIA acute kidney injury  . TIA (transient ischemic attack) 03/16/2017   [facility]   Past Surgical History:  Procedure Laterality Date  . BREAST LUMPECTOMY WITH AXILLARY LYMPH NODE BIOPSY Left 2000   biopsy  . BREAST SURGERY     partial mastectomy with lymph node removal  . ENDOBRONCHIAL ULTRASOUND Bilateral 09/05/2020   Procedure: ENDOBRONCHIAL ULTRASOUND;  Surgeon: Collene Gobble, MD;  Location: [facility];  Service: Cardiopulmonary;  Laterality: Bilateral;  . MASTECTOMY Right 2019   & partial mastectomy left breast 2000  . MASTECTOMY MODIFIED RADICAL Right 08/26/2018   Procedure: MASTECTOMY MODIFIED RADICAL;  Surgeon: Jovita Kussmaul, MD;  Location: [place];  Service: General;  Laterality: Right;  . MODIFIED MASTECTOMY Right 08/26/2018  . PORTACATH PLACEMENT Left 04/08/2018   Procedure: INSERTION PORT-A-CATH;  Surgeon: Jovita Kussmaul, MD;  Location: [place];  Service: General;  Laterality: Left;  . TUBAL LIGATION Bilateral 2004  . VIDEO BRONCHOSCOPY N/A 09/05/2020   Procedure: VIDEO BRONCHOSCOPY WITHOUT FLUORO;  Surgeon: Collene Gobble, MD;  Location: [facility];  Service: Cardiopulmonary;  Laterality: N/A;  . WISDOM TOOTH EXTRACTION       Current  Outpatient Medications  Medication Sig Dispense Refill  . acyclovir (ZOVIRAX) 400 MG tablet TAKE 1 TABLET BY MOUTH TWICE A DAY 180 tablet 1  . anastrozole (ARIMIDEX) 1 MG tablet Take 1 tablet (1 mg total) by mouth daily. 90 tablet 4  . Ascorbic Acid (VITAMIN C) 1000 MG tablet Take 1,000 mg by mouth daily.    . carvedilol (COREG) 3.125 MG tablet TAKE 1 TABLET (3.125 MG TOTAL) BY MOUTH 2 (TWO) TIMES DAILY WITH A MEAL. 180 tablet 2  . Cholecalciferol (VITAMIN D3 PO) Take by mouth daily.    . Cyanocobalamin (VITAMIN B12) 500 MCG TABS Take 500 mcg by mouth daily.     . ferrous sulfate 325 (65 FE) MG tablet Take 1 tablet (325 mg total) by mouth daily. 30 tablet 0  . lidocaine (XYLOCAINE) 2 % solution Use as directed 5 mLs in the mouth or throat as needed for mouth pain. Swish and spit 200 mL 1  . lidocaine-prilocaine (EMLA) cream Apply 1 application topically as needed. 30 g 6  . losartan (COZAAR) 50 MG tablet Take 1 tablet (50 mg total) by mouth daily. 30 tablet 11  . Multiple Vitamin (MULITIVITAMIN WITH MINERALS) TABS Take 1 tablet by mouth daily.    . prochlorperazine (COMPAZINE) 10 MG tablet Take 1 tablet (10 mg total) by mouth every 6 (six) hours as needed (Nausea or vomiting). 60 tablet 1  . spironolactone-hydrochlorothiazide (ALDACTAZIDE) 25-25 MG tablet TAKE 1 TABLET BY MOUTH EVERY DAY 30 tablet 2  . venlafaxine XR (EFFEXOR-XR) 37.5 MG 24 hr capsule TAKE 1 CAPSULE BY MOUTH DAILY WITH BREAKFAST. 90 capsule 4  . zinc gluconate 50 MG tablet Take 50 mg by mouth daily.     No current facility-administered medications for this encounter.    Allergies:   Ace inhibitors, Lisinopril, Amlodipine, Shellfish allergy, Adhesive [tape], and Latex   Social History:  The patient  reports that she has never smoked. She has never used smokeless tobacco. She reports that she does not drink alcohol and does not use drugs.   Family History:  The patient's family history includes Alzheimer's disease in her  maternal grandmother; CAD in her maternal grandmother and paternal grandmother; Diabetes in her father; Diverticulosis in her father; Hypertension in her father, maternal grandmother, mother, and paternal grandmother; Lung cancer (age of onset: 76) in her father; Lymphoma (age of onset: 72) in her paternal aunt; Pneumonia in her maternal aunt; Sickle cell trait in her paternal grandfather; Stroke in her paternal grandfather.   ROS:  Please see the history of present illness.   All other systems are personally reviewed and negative.   Vitals:   11/10/20 1137  BP: (!) 144/78  Pulse: 66  SpO2: 99%  Weight: 82.6 kg (182 lb 3.2 oz)    Exam:   General:  Well appearing. No resp difficulty HEENT: normal Neck: supple. no JVD. Carotids 2+ bilat; no bruits. No lymphadenopathy or thryomegaly appreciated. Cor: PMI nondisplaced. Regular rate & rhythm. No rubs, gallops or murmurs. Lungs: clear Abdomen: soft, nontender, nondistended. No hepatosplenomegaly. No bruits or masses. Good bowel sounds. Extremities: no cyanosis, clubbing, rash, edema Neuro: alert & orientedx3, cranial nerves grossly intact. moves all  4 extremities w/o difficulty. Affect pleasant  ECG: NSR 69 No ST-T wave abnormalities.  Personally reviewed   Recent Labs: 07/31/2020: Magnesium 1.6 11/06/2020: ALT 57; BUN 31; Creatinine, Ser 1.96; Hemoglobin 8.0; Platelets 80; Potassium 4.5; Sodium 137  Personally reviewed   Wt Readings from Last 3 Encounters:  11/10/20 82.6 kg (182 lb 3.2 oz)  11/06/20 82.1 kg (181 lb 1.6 oz)  10/16/20 86.1 kg (189 lb 12 oz)     ASSESSMENT AND PLAN:  1. Right Breast Cancer diagnosed 4/19 stage IV - triple positive. Found to have Stage IV. Disease in 4/20. - switched to [place] on 1/21 - PET scan 9/21 with progressive disease - Switching back to Herceptin due to SEs on 11/29/20 - Echo today 11/10/20 EF 60-65% GLS -23.2 Personally reviewed - I reviewed echos personally. EF and Doppler parameters  stable. No HF on exam. Continue Kadcyla.  - Will change echo surveillance to q6 months given cardiac stability   2. HTN -Blood pressure relatively well controlled. Continue current regimen.  Signed, Glori Bickers, MD  11/10/2020 11:57 AM  [facility] [place] [address] [phone] (office) [phone] (fax)

## 2020-11-10 NOTE — Addendum Note (Signed)
Encounter addended by: Kerry Dory, CMA on: 11/10/2020 12:15 PM  Actions taken: Order list changed, Diagnosis association updated, Clinical Note Signed

## 2020-11-10 NOTE — Patient Instructions (Signed)
Your physician recommends that you schedule a follow-up appointment in: 6 months with Dr Stephanie Coup and echo  Your physician has requested that you have an echocardiogram. Echocardiography is a painless test that uses sound waves to create images of your heart. It provides your doctor with information about the size and shape of your heart and how well your heart's chambers and valves are working. This procedure takes approximately one hour. There are no restrictions for this procedure.   If you have any questions or concerns before your next appointment please send [place] a message through [place] or call our office at [phone].    TO LEAVE A MESSAGE FOR THE NURSE SELECT OPTION 2, PLEASE LEAVE A MESSAGE INCLUDING: . YOUR NAME . DATE OF BIRTH . CALL BACK NUMBER . REASON FOR CALL**this is important as we prioritize the call backs  YOU WILL RECEIVE A CALL BACK THE SAME DAY AS LONG AS YOU CALL BEFORE 4:00 PM

## 2020-11-10 NOTE — Progress Notes (Signed)
  Echocardiogram 2D Echocardiogram has been performed.  Geoffery Lyons Swaim 11/10/2020, 10:56 AM

## 2020-11-22 ENCOUNTER — Other Ambulatory Visit: Payer: BC Managed Care – PPO

## 2020-11-22 ENCOUNTER — Ambulatory Visit: Payer: BC Managed Care – PPO

## 2020-11-29 ENCOUNTER — Inpatient Hospital Stay: Payer: BC Managed Care – PPO

## 2020-11-29 ENCOUNTER — Other Ambulatory Visit: Payer: Self-pay

## 2020-11-29 VITALS — BP 111/48 | HR 68 | Temp 98.2°F | Resp 17 | Ht 59.0 in | Wt 181.8 lb

## 2020-11-29 DIAGNOSIS — Z5112 Encounter for antineoplastic immunotherapy: Secondary | ICD-10-CM | POA: Diagnosis not present

## 2020-11-29 DIAGNOSIS — C50811 Malignant neoplasm of overlapping sites of right female breast: Secondary | ICD-10-CM | POA: Diagnosis not present

## 2020-11-29 DIAGNOSIS — C50411 Malignant neoplasm of upper-outer quadrant of right female breast: Secondary | ICD-10-CM

## 2020-11-29 DIAGNOSIS — Z170 Estrogen receptor positive status [ER+]: Secondary | ICD-10-CM

## 2020-11-29 DIAGNOSIS — C7951 Secondary malignant neoplasm of bone: Secondary | ICD-10-CM

## 2020-11-29 DIAGNOSIS — Z95828 Presence of other vascular implants and grafts: Secondary | ICD-10-CM

## 2020-11-29 DIAGNOSIS — Z79899 Other long term (current) drug therapy: Secondary | ICD-10-CM | POA: Diagnosis not present

## 2020-11-29 DIAGNOSIS — Z79811 Long term (current) use of aromatase inhibitors: Secondary | ICD-10-CM | POA: Diagnosis not present

## 2020-11-29 LAB — CBC WITH DIFFERENTIAL/PLATELET
Abs Immature Granulocytes: 0.03 10*3/uL (ref 0.00–0.07)
Basophils Absolute: 0 10*3/uL (ref 0.0–0.1)
Basophils Relative: 1 %
Eosinophils Absolute: 0.2 10*3/uL (ref 0.0–0.5)
Eosinophils Relative: 3 %
HCT: 24.1 % — ABNORMAL LOW (ref 36.0–46.0)
Hemoglobin: 7.9 g/dL — ABNORMAL LOW (ref 12.0–15.0)
Immature Granulocytes: 1 %
Lymphocytes Relative: 21 %
Lymphs Abs: 1.3 10*3/uL (ref 0.7–4.0)
MCH: 28 pg (ref 26.0–34.0)
MCHC: 32.8 g/dL (ref 30.0–36.0)
MCV: 85.5 fL (ref 80.0–100.0)
Monocytes Absolute: 0.7 10*3/uL (ref 0.1–1.0)
Monocytes Relative: 11 %
Neutro Abs: 3.9 10*3/uL (ref 1.7–7.7)
Neutrophils Relative %: 63 %
Platelets: 89 10*3/uL — ABNORMAL LOW (ref 150–400)
RBC: 2.82 MIL/uL — ABNORMAL LOW (ref 3.87–5.11)
RDW: 18.1 % — ABNORMAL HIGH (ref 11.5–15.5)
WBC: 6.2 10*3/uL (ref 4.0–10.5)
nRBC: 0 % (ref 0.0–0.2)

## 2020-11-29 LAB — COMPREHENSIVE METABOLIC PANEL
ALT: 54 U/L — ABNORMAL HIGH (ref 0–44)
AST: 88 U/L — ABNORMAL HIGH (ref 15–41)
Albumin: 3 g/dL — ABNORMAL LOW (ref 3.5–5.0)
Alkaline Phosphatase: 127 U/L — ABNORMAL HIGH (ref 38–126)
Anion gap: 7 (ref 5–15)
BUN: 32 mg/dL — ABNORMAL HIGH (ref 6–20)
CO2: 20 mmol/L — ABNORMAL LOW (ref 22–32)
Calcium: 10.9 mg/dL — ABNORMAL HIGH (ref 8.9–10.3)
Chloride: 111 mmol/L (ref 98–111)
Creatinine, Ser: 1.82 mg/dL — ABNORMAL HIGH (ref 0.44–1.00)
GFR, Estimated: 34 mL/min — ABNORMAL LOW (ref >60)
Glucose, Bld: 107 mg/dL — ABNORMAL HIGH (ref 70–99)
Potassium: 3.9 mmol/L (ref 3.5–5.1)
Sodium: 138 mmol/L (ref 135–145)
Total Bilirubin: 0.7 mg/dL (ref 0.3–1.2)
Total Protein: 7.9 g/dL (ref 6.5–8.1)

## 2020-11-29 MED ORDER — SODIUM CHLORIDE 0.9% FLUSH
10.0000 mL | INTRAVENOUS | Status: DC | PRN
  Administered 2020-11-29: 10 mL via INTRAVENOUS
  Filled 2020-11-29: qty 10

## 2020-11-29 MED ORDER — EPOETIN ALFA-EPBX 40000 UNIT/ML IJ SOLN: 20000.0000 [IU] | Freq: Once | INTRAMUSCULAR | Status: DC

## 2020-11-29 MED ORDER — ACETAMINOPHEN 325 MG PO TABS
650.0000 mg | ORAL_TABLET | Freq: Once | ORAL | Status: AC
  Administered 2020-11-29: 650 mg via ORAL

## 2020-11-29 MED ORDER — DENOSUMAB 120 MG/1.7ML ~~LOC~~ SOLN: 120.0000 mg | Freq: Once | SUBCUTANEOUS | Status: DC

## 2020-11-29 MED ORDER — SODIUM CHLORIDE 0.9 % IV SOLN
Freq: Once | INTRAVENOUS | Status: AC
  Filled 2020-11-29: qty 250

## 2020-11-29 MED ORDER — SODIUM CHLORIDE 0.9% FLUSH
10.0000 mL | INTRAVENOUS | Status: DC | PRN
  Administered 2020-11-29: 10 mL
  Filled 2020-11-29: qty 10

## 2020-11-29 MED ORDER — TRASTUZUMAB-DKST CHEMO 150 MG IV SOLR
8.0000 mg/kg | Freq: Once | INTRAVENOUS | Status: AC
  Administered 2020-11-29: 651 mg via INTRAVENOUS
  Filled 2020-11-29: qty 31

## 2020-11-29 MED ORDER — DIPHENHYDRAMINE HCL 25 MG PO CAPS
ORAL_CAPSULE | ORAL | Status: AC
  Filled 2020-11-29: qty 1

## 2020-11-29 MED ORDER — DIPHENHYDRAMINE HCL 25 MG PO CAPS
25.0000 mg | ORAL_CAPSULE | Freq: Once | ORAL | Status: AC
  Administered 2020-11-29: 25 mg via ORAL

## 2020-11-29 MED ORDER — ACETAMINOPHEN 325 MG PO TABS
ORAL_TABLET | ORAL | Status: AC
  Filled 2020-11-29: qty 2

## 2020-11-29 MED ORDER — HEPARIN SOD (PORK) LOCK FLUSH 100 UNIT/ML IV SOLN
500.0000 [IU] | Freq: Once | INTRAVENOUS | Status: AC | PRN
  Administered 2020-11-29: 500 [IU]
  Filled 2020-11-29: qty 5

## 2020-11-29 MED ORDER — HEPARIN SOD (PORK) LOCK FLUSH 100 UNIT/ML IV SOLN
500.0000 [IU] | Freq: Once | INTRAVENOUS | Status: DC
  Filled 2020-11-29: qty 5

## 2020-11-29 NOTE — Patient Instructions (Signed)
Trastuzumab injection for infusion What is this medicine? TRASTUZUMAB (tras TOO zoo mab) is a monoclonal antibody. It is used to treat breast cancer and stomach cancer. This medicine may be used for other purposes; ask your health care provider or pharmacist if you have questions. COMMON BRAND NAME(S): Herceptin, Herzuma, KANJINTI, Ogivri, Ontruzant, Trazimera What should I tell my health care provider before I take this medicine? They need to know if you have any of these conditions:  heart disease  heart failure  lung or breathing disease, like asthma  an unusual or allergic reaction to trastuzumab, benzyl alcohol, or other medications, foods, dyes, or preservatives  pregnant or trying to get pregnant  breast-feeding How should I use this medicine? This drug is given as an infusion into a vein. It is administered in a hospital or clinic by a specially trained health care professional. Talk to your pediatrician regarding the use of this medicine in children. This medicine is not approved for use in children. Overdosage: If you think you have taken too much of this medicine contact a poison control center or emergency room at once. NOTE: This medicine is only for you. Do not share this medicine with others. What if I miss a dose? It is important not to miss a dose. Call your doctor or health care professional if you are unable to keep an appointment. What may interact with this medicine? This medicine may interact with the following medications:  certain types of chemotherapy, such as daunorubicin, doxorubicin, epirubicin, and idarubicin This list may not describe all possible interactions. Give your health care provider a list of all the medicines, herbs, non-prescription drugs, or dietary supplements you use. Also tell them if you smoke, drink alcohol, or use illegal drugs. Some items may interact with your medicine. What should I watch for while using this medicine? Visit your  doctor for checks on your progress. Report any side effects. Continue your course of treatment even though you feel ill unless your doctor tells you to stop. Call your doctor or health care professional for advice if you get a fever, chills or sore throat, or other symptoms of a cold or flu. Do not treat yourself. Try to avoid being around people who are sick. You may experience fever, chills and shaking during your first infusion. These effects are usually mild and can be treated with other medicines. Report any side effects during the infusion to your health care professional. Fever and chills usually do not happen with later infusions. Do not become pregnant while taking this medicine or for 7 months after stopping it. Women should inform their doctor if they wish to become pregnant or think they might be pregnant. Women of child-bearing potential will need to have a negative pregnancy test before starting this medicine. There is a potential for serious side effects to an unborn child. Talk to your health care professional or pharmacist for more information. Do not breast-feed an infant while taking this medicine or for 7 months after stopping it. Women must use effective birth control with this medicine. What side effects may I notice from receiving this medicine? Side effects that you should report to your doctor or health care professional as soon as possible:  allergic reactions like skin rash, itching or hives, swelling of the face, lips, or tongue  chest pain or palpitations  cough  dizziness  feeling faint or lightheaded, falls  fever  general ill feeling or flu-like symptoms  signs of worsening heart failure like   breathing problems; swelling in your legs and feet  unusually weak or tired Side effects that usually do not require medical attention (report to your doctor or health care professional if they continue or are bothersome):  bone pain  changes in  taste  diarrhea  joint pain  nausea/vomiting  weight loss This list may not describe all possible side effects. Call your doctor for medical advice about side effects. You may report side effects to FDA at 1-800-FDA-1088. Where should I keep my medicine? This drug is given in a hospital or clinic and will not be stored at home. NOTE: This sheet is a summary. It may not cover all possible information. If you have questions about this medicine, talk to your doctor, pharmacist, or health care provider.  2020 Elsevier/Gold Standard (2016-11-12 14:37:52)  

## 2020-11-30 LAB — FOLLICLE STIMULATING HORMONE: FSH: 70.1 m[IU]/mL

## 2020-12-13 ENCOUNTER — Other Ambulatory Visit: Payer: BC Managed Care – PPO

## 2020-12-13 ENCOUNTER — Ambulatory Visit: Payer: BC Managed Care – PPO

## 2020-12-13 LAB — ESTRADIOL, ULTRA SENS: Estradiol, Sensitive: <2.5 pg/mL

## 2020-12-16 ENCOUNTER — Other Ambulatory Visit (HOSPITAL_COMMUNITY): Payer: Self-pay | Admitting: Internal Medicine

## 2020-12-19 NOTE — Progress Notes (Signed)
[facility]  [phone] [phone]    ID: [name] [name] DOB: Jul 29, 1971  MR#: [id number]  MBO#:[id number]  Patient Care Team: Jarrett Soho, PA-C as PCP - General (Family Medicine) Jake Bathe, MD as PCP - Cardiology (Cardiology) Christyn Gutkowski, Valentino Hue, MD as Consulting Physician (Oncology) Marylou Flesher, MD as Referring Physician (Internal Medicine) Terrial Rhodes, MD as Consulting Physician (Nephrology) Dorothy Puffer, MD as Consulting Physician (Radiation Oncology) Griselda Miner, MD as Consulting Physician (General Surgery) Laurey Morale, MD as Consulting Physician (Cardiology) Pershing Proud, RN as Oncology Nurse Navigator Donnelly Angelica, RN as Oncology Nurse Navigator OTHER MD:   CHIEF COMPLAINT: Triple positive breast cancer (s/p right mastectomy)  CURRENT TREATMENT: T-DM1; anastrozole; Xgeva (Q 6 w); retacrit   INTERVAL HISTORY: [name] did not show today for follow-up and treatment of her triple positive breast cancer.   She was switched back from T-DM1 to herceptin on 11/29/2020. Repeat echocardiogram on 11/10/2020 showed an ejection fraction of 60-65%.  She continues on anastrozole.  She has no unusual side effects from this other than mild vaginal dryness which also of course is due to menopause  She last received denosumab/Xgeva on 10/16/2020.  We are doing this every 6 weeks.  She has no side effects from this that she is aware of.  Since her last visit, she has not undergone any additional studies. Her most recent PET scan was on 08/24/2020.  That showed an area in the right hilum with a high SUV, compatible with new spot of activity.  She is also up to date on left mammography, most recently on 02/09/2020.   REVIEW OF SYSTEMS: [name]    COVID 19 VACCINATION STATUS: fully vaccinated [employer]), most recently April 2021.   HISTORY OF CURRENT ILLNESS: From the original intake note:  [name] Pohlmann palpated a lump on  01/15/2018. She felt soreness and shooting pain under her arm and in the right breast. She underwent bilateral diagnostic mammography with tomography and right breast ultrasonography at [facility] on 03/12/2018 showing: breast density category C. The is architectural distortion at the 11 o'clock position. There is also an oval mass in the right breast anterior depth. Examination of the right axilla showed enlarged lymph nodes. Ultrasonography showed a 4.6 cm mass in the right breast upper outer quadrant posterior depth. An additional 1.2 cm oval mass in the right breast 12 o'clock middle depth. The lymph nodes in the right axillary are highly suggestive of malignancy.   Accordingly on 03/16/2018 she proceeded to biopsy of the right breast area in question. The pathology from this procedure showed ([id number]): At both the 11 and 12 o'clock positions: Invasive ductal carcinoma grade II. Ductal carcinoma in situ, high grade, with necrosis and calcifications. Prognostic indicators significant for: estrogen receptor, 90% positive and progesterone receptor, 40% positive, both with strong staining intensity. Proliferation marker Ki67 at 80%. HER2 amplified with ratios HER2/CEP17 SIGNALS 6.90 and average HER2 copies per cell 14.50  The patient's subsequent history is as detailed below.   PAST MEDICAL HISTORY: Past Medical History:  Diagnosis Date  . Anxiety   . Breast cancer, left breast (HCC) 2000   Underwent lumpectomy, chemotherapy and radiation  . Facial paralysis/Bells palsy    left side  . Family history of lung cancer   . Family history of lymphoma   . Hypertension   . Neuropathy    IN FINGERS AND TOES   RECENT INFUSION OF CHEMO  . Personal history of breast cancer  05/06/2018  . Renal disorder    just after TIA acute kidney injury  . TIA (transient ischemic attack) 03/16/2017   [facility]  The patient has a previous history of left breast cancer diagnosed in 2000. She was seen by [facility] in [name], [place] under Dr. Loreta Ave. According to the patient, her left breast cancer was stage II with no lymph node involvement (likely T2N0). She had chemotherapy every 3 weeks for about 6 months. She did not takes any "red drugs." She also did not take anti-estrogens (likely ER/PR negative).   TIA in 2018. She saw a neurologist as a precaution. She denies any clotting issues.    PAST SURGICAL HISTORY: Past Surgical History:  Procedure Laterality Date  . BREAST LUMPECTOMY WITH AXILLARY LYMPH NODE BIOPSY Left 2000   biopsy  . BREAST SURGERY     partial mastectomy with lymph node removal  . ENDOBRONCHIAL ULTRASOUND Bilateral 09/05/2020   Procedure: ENDOBRONCHIAL ULTRASOUND;  Surgeon: Collene Gobble, MD;  Location: [facility];  Service: Cardiopulmonary;  Laterality: Bilateral;  . MASTECTOMY Right 2019   & partial mastectomy left breast 2000  . MASTECTOMY MODIFIED RADICAL Right 08/26/2018   Procedure: MASTECTOMY MODIFIED RADICAL;  Surgeon: Jovita Kussmaul, MD;  Location: [place];  Service: General;  Laterality: Right;  . MODIFIED MASTECTOMY Right 08/26/2018  . PORTACATH PLACEMENT Left 04/08/2018   Procedure: INSERTION PORT-A-CATH;  Surgeon: Jovita Kussmaul, MD;  Location: [place];  Service: General;  Laterality: Left;  . TUBAL LIGATION Bilateral 2004  . VIDEO BRONCHOSCOPY N/A 09/05/2020   Procedure: VIDEO BRONCHOSCOPY WITHOUT FLUORO;  Surgeon: Collene Gobble, MD;  Location: [facility];  Service: Cardiopulmonary;  Laterality: N/A;  . WISDOM TOOTH EXTRACTION      FAMILY HISTORY Family History  Problem Relation Age of Onset  . Hypertension Mother   . Hypertension Father   . Diverticulosis Father   . Diabetes Father   . Lung cancer Father 28       hx smoking  . Hypertension Maternal Grandmother   . CAD Maternal Grandmother   . Alzheimer's disease Maternal Grandmother        died at 76  . Hypertension Paternal Grandmother   . CAD Paternal Grandmother   . Stroke  Paternal Grandfather   . Sickle cell trait Paternal Grandfather   . Pneumonia Maternal Aunt        died at a young adult  . Lymphoma Paternal Aunt 51  The patient's father is alive at age 50 and has a history of lung cancer. The patient's mother is alive at age 22. The patient has 1 brother and no sisters. She denies a family history of breast or ovarian cancer.     GYNECOLOGIC HISTORY:  Patient's last menstrual period was 12/21/2017. Menarche: 50 years old Age at first live birth: 50 years old She is GXP3.  Her LMP was  January 2019.  The patient used oral contraceptive from [phone] and the Depo-provera shot from [phone] with no complications. She never used HRT.  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  She also worked at [employer] as a Occupational psychologist.  Her husband, [name], is a Administrator. The patient's son, Shea Stakes age 52, works at [employer] in [place]. The patient's daughters, Lilia Pro age 54, and Levonne Spiller age 65, are students. The patient's adopted son, Vonna Kotyk age 41, is also a Ship broker.  ADVANCED DIRECTIVES: In the absence of any documents to the contrary the patient's husband is her healthcare power of attorney   HEALTH MAINTENANCE: Social History   Tobacco Use  . Smoking status: Never Smoker  . Smokeless tobacco: Never Used  Vaping Use  . Vaping Use: Never used  Substance Use Topics  . Alcohol use: No  . Drug use: No     Colonoscopy: n/a  PAP: 2015  Bone density: never done   Allergies  Allergen Reactions  . Ace Inhibitors Swelling    Swelling of lips and face  . Lisinopril Swelling    Swelling of lips, face  . Amlodipine Swelling    Leg swelling  . Shellfish Allergy Swelling  . Adhesive [Tape] Itching and Rash    Please use paper tape  . Latex Itching and Rash    Current Outpatient Medications  Medication Sig Dispense Refill  . acyclovir (ZOVIRAX) 400  MG tablet TAKE 1 TABLET BY MOUTH TWICE A DAY 180 tablet 1  . anastrozole (ARIMIDEX) 1 MG tablet Take 1 tablet (1 mg total) by mouth daily. 90 tablet 4  . Ascorbic Acid (VITAMIN C) 1000 MG tablet Take 1,000 mg by mouth daily.    . carvedilol (COREG) 3.125 MG tablet Take 1 tablet (3.125 mg total) by mouth 2 (two) times daily with a meal. 180 tablet 2  . Cholecalciferol (VITAMIN D3 PO) Take by mouth daily.    . Cyanocobalamin (VITAMIN B12) 500 MCG TABS Take 500 mcg by mouth daily.     . ferrous sulfate 325 (65 FE) MG tablet Take 1 tablet (325 mg total) by mouth daily. 30 tablet 0  . lidocaine (XYLOCAINE) 2 % solution Use as directed 5 mLs in the mouth or throat as needed for mouth pain. Swish and spit 200 mL 1  . lidocaine-prilocaine (EMLA) cream Apply 1 application topically as needed. 30 g 6  . losartan (COZAAR) 50 MG tablet TAKE 1 TABLET BY MOUTH EVERY DAY 90 tablet 3  . Multiple Vitamin (MULITIVITAMIN WITH MINERALS) TABS Take 1 tablet by mouth daily.    . prochlorperazine (COMPAZINE) 10 MG tablet Take 1 tablet (10 mg total) by mouth every 6 (six) hours as needed (Nausea or vomiting). 60 tablet 1  . spironolactone-hydrochlorothiazide (ALDACTAZIDE) 25-25 MG tablet TAKE 1 TABLET BY MOUTH EVERY DAY 30 tablet 2  . venlafaxine XR (EFFEXOR-XR) 37.5 MG 24 hr capsule TAKE 1 CAPSULE BY MOUTH DAILY WITH BREAKFAST. 90 capsule 4  . zinc gluconate 50 MG tablet Take 50 mg by mouth daily.     No current facility-administered medications for this visit.    OBJECTIVE:  African-American woman who appears stated age  There were no vitals filed for this visit. Wt Readings from Last 3 Encounters:  11/29/20 181 lb 12 oz (82.4 kg)  11/10/20 182 lb 3.2 oz (82.6 kg)  11/06/20 181 lb 1.6 oz (82.1 kg)   There is no height or weight on file to calculate BMI.    ECOG FS:1 - Symptomatic but completely ambulatory  Sclerae unicteric, EOMs intact Wearing a mask No cervical or supraclavicular adenopathy Lungs no  rales or rhonchi Heart regular rate and rhythm Abd soft, nontender, positive bowel sounds MSK no focal spinal tenderness, no upper extremity lymphedema Neuro: nonfocal, well oriented, appropriate affect Breasts:       LAB RESULTS:  CMP     Component Value Date/Time   NA 138 11/29/2020 0810   K 3.9 11/29/2020 0810   CL 111  11/29/2020 0810   CO2 20 (L) 11/29/2020 0810   GLUCOSE 107 (H) 11/29/2020 0810   BUN 32 (H) 11/29/2020 0810   CREATININE 1.82 (H) 11/29/2020 0810   CREATININE 1.56 (H) 07/31/2020 1400   CALCIUM 10.9 (H) 11/29/2020 0810   CALCIUM 11.2 (H) 02/24/2020 0830   PROT 7.9 11/29/2020 0810   ALBUMIN 3.0 (L) 11/29/2020 0810   AST 88 (H) 11/29/2020 0810   AST 134 (H) 07/31/2020 1400   ALT 54 (H) 11/29/2020 0810   ALT 81 (H) 07/31/2020 1400   ALKPHOS 127 (H) 11/29/2020 0810   BILITOT 0.7 11/29/2020 0810   BILITOT 0.9 07/31/2020 1400   GFRNONAA 34 (L) 11/29/2020 0810   GFRNONAA 39 (L) 07/31/2020 1400   GFRAA 41 (L) 08/30/2020 0808   GFRAA 45 (L) 07/31/2020 1400    Lab Results  Component Value Date   TOTALPROTELP 7.0 02/25/2019     Lab Results  Component Value Date   KPAFRELGTCHN 76.7 (H) 02/25/2019   LAMBDASER 27.2 (H) 02/25/2019   KAPLAMBRATIO 2.82 (H) 02/25/2019    Lab Results  Component Value Date   WBC 6.2 11/29/2020   NEUTROABS 3.9 11/29/2020   HGB 7.9 (L) 11/29/2020   HCT 24.1 (L) 11/29/2020   MCV 85.5 11/29/2020   PLT 89 (L) 11/29/2020   No results found for: LABCA2  No components found for: KZSWFU932  No results for input(s): INR in the last 168 hours.  No results found for: LABCA2  No results found for: TFT732  No results found for: KGU542  No results found for: HCW237  Lab Results  Component Value Date   CA2729 38.7 (H) 02/25/2019    No components found for: HGQUANT  No results found for: CEA1 / No results found for: CEA1   No results found for: AFPTUMOR  No results found for: CHROMOGRNA  No results found for:  HGBA, HGBA2QUANT, HGBFQUANT, HGBSQUAN (Hemoglobinopathy evaluation)   No results found for: LDH  No results found for: IRON, TIBC, IRONPCTSAT (Iron and TIBC)  No results found for: FERRITIN  Urinalysis    Component Value Date/Time   COLORURINE YELLOW 08/18/2019 [place] 08/18/2019 1420   LABSPEC 1.016 08/18/2019 1420   PHURINE 5.0 08/18/2019 [place] 08/18/2019 [place] 08/18/2019 [place] 08/18/2019 1420   KETONESUR NEGATIVE 08/18/2019 1420   PROTEINUR NEGATIVE 08/18/2019 1420   UROBILINOGEN 0.2 11/29/2011 1656   NITRITE NEGATIVE 08/18/2019 1420   LEUKOCYTESUR SMALL (A) 08/18/2019 1420    STUDIES: No results found.   ELIGIBLE FOR AVAILABLE RESEARCH PROTOCOL: no   ASSESSMENT: 50 y.o. [place], [place] woman  (1) status post left lumpectomy in 2000 for a (?) T2N0 breast cancer,  (a) status post adjuvant chemotherapy  (b) status post adjuvant radiation  (c) did not receive antiestrogens  (2) genetics testing May 18, 2018 through the common Hereditary Cancers Panel + Myelodysplastic Syndrome/Leukemia Panel found no deleterious mutations in APC, ATM, AXIN2, BARD1, BLM, BMPR1A, BRCA1, BRCA2, BRIP1, CDH1, CDK4, CDKN2A (p14ARF), CDKN2A (p16INK4a), CEBPA, CHEK2, CTNNA1, DICER1, EPCAM*, GATA2, GREM1*, HRAS, KIT, MEN1, MLH1, MSH2, MSH3, MSH6, MUTYH, NBN, NF1, PALB2, PDGFRA, PMS2, POLD1, POLE, PTEN, RAD50, RAD51C, RAD51D, RUNX1, SDHB, SDHC, SDHD, SMAD4, SMARCA4, STK11, TERC, TERT, TP53, TSC1, TSC2, VHL The following genes were evaluated for sequence changes only: HOXB13*, NTHL1*, SDHA  (a)  3 variants of uncertain significance were identified in the genes BARD1 c.764A>G (p.Asn255Ser), BRIP1 c.2563C>T (p.Arg855Cys), and PALB2 c.3103A>T (p.Ile1035Phe).   (  3) status post right breast biopsy 03/16/2018 for a clinical mT2-3 N2, anatomic stage III invasive ductal carcinoma, grade 2, triple positive, with an MIB-1 of 80%.  (a)  bone scan and chest CT scan negative for metastases except for a T6 lytic lesion of concern  (b) thoracic spine MRI was ordered May 2019 but never performed.  (4) neoadjuvant chemotherapy consisting of carboplatin, docetaxel, trastuzumab and Pertuzumab every 3 weeks x 6 starting 04/16/2018  (a) pertuzumab held beginning with cycle 2 because of diarrhea  (5) trastuzumab was to be continued Q4w indefinitely, discontinued after 10/21/2019 dose with progression  (a) echocardiogram on 06/29/2018 showed an ejection fraction in the 65-70% range  (b) echocardiogram 11/19/2018 showed an ejection fraction in the 60-65% range  (c) echo 04/07/2019 shows an ejection fraction in the 55-60% range  (d) echo 07/15/2019 shows EF of 60-65%--for additional echoes see under #12 below  (6) right mastectomy and sentinel lymph node sampling on 08/26/2018 found a residual 2.5cm invasive ductal carcinoma, with 2 of 5 sentinel lymph nodes positive, ypT2, ypN1a; margins were clear  (a) repeat prognostic panel confirms tumor still triple positive (HER2 3+)  (7) adjuvant radiation 09/30/2018 - 11/16/2018 Site/dose:   The patient initially received a dose of 50.4 Gy in 28 fractions to the right chest wall and supraclavicular region. This was delivered using a 3-D conformal, 4 field technique. The patient then received a boost to the mastectomy scar. This delivered an additional 10 Gy in 5 fractions using an en face electron field. The total dose was 60.4   (8) anastrozole started 12/03/2018  (a) [place] on 10/01/2018 was 64.3, and estradiol 7.0, consistent with menopause  (b) referral to pelvic floor rehabilitation 12/03/2018  (c) [place] on 03/16/2020 was 80.8 with estradiol less than 2.5   METASTATIC DISEASE: March 2020 (9) CT of neck and CT angiography of the chest 02/04/2019 finds the T6 lytic lesion noted May 2019 (see #3 above) is now sclerotic; a sclerotic T3 lesion is stable; there is a new lytic lesion in the  manubrium  (a) PET scan 03/12/2019 shows manubrium, T3 and T5 metastases, no visceral disease  (b) biopsy of manubrial lesion planned but not done secondary to pandemic  (c) Bone scan 09/01/19 shows ? progression of disease in spine; MRI 09/02/19 shows early metastases in T10 (new), T3 and T5 stable; CT chest 09/01/19 shows 18mm pulmonary nodules that are new, but nonspecific and warrant future follow up  (d) PET 10/26/2019 shows no lung or liver lesions; new mediastinal adenopathy, bone lesions-- T-DM1 started  (e) PET scan 03/08/2020 shows resolution of the mediastinal adenopathy and hypermetabolic bone lesions, questionable subcutaneous nodule along the left lateral shoulder  (f) for additional staging studies see under "12" below  (10) SRS to spine, palliative treatment to sternum, from 05/13/2019 through 05/26/2019:  1. Chest_sternum // 40 Gy in 10 fractions  2. Thoracic Spine (T3) // 18 Gy in 1 fraction   3. Thoracic Spine (T5) // 18 Gy in 1 fraction  (11) denosumab/Xgeva started 09/08/2019  (12) T-DM1 started 01/15/20201  (a) echo 10/26/2019 shows stable EF at 55-60%  (b) echo 03/08/2020 shows an ejection fraction in the 70-75% range  (c) echo 07/12/2020 shows an ejection fraction in the 65 to 70% range.  (d) PET scan 03/08/2020 shows no active disease; a 0.9 cm subcutaneous nodule in the left shoulder area seen on the PET was not identifiable by exam 03/16/2020  (e) PET 08/24/2020 shows stable disease except for a  new 0.6 cm lymph node with an SUV of 10.1  (f) bronchoscopy 09/05/2020 with endobronchial ultrasound found no detectable mass in the region noted on the September PET scan  (g) T-DM1 discontinued and trastuzumab resumed 11/29/2020  PLAN: [place] did not show for visit or treatment 12/20/2020. She has been rescheduled  Sarajane Jews C. Makelle Marrone, MD 12/20/20 8:03 AM Medical Oncology and Hematology [facility] [place], [place] [address] Tel. [phone]     Fax. [phone]   I, Wilburn Mylar, am acting as scribe for Dr. Virgie Dad. Lijah Bourque.  I, Lurline Del MD, have reviewed the above documentation for accuracy and completeness, and I agree with the above.   *Total Encounter Time as defined by the Centers for Medicare and Medicaid Services includes, in addition to the face-to-face time of a patient visit (documented in the note above) non-face-to-face time: obtaining and reviewing outside history, ordering and reviewing medications, tests or procedures, care coordination (communications with other health care professionals or caregivers) and documentation in the medical record.

## 2020-12-20 ENCOUNTER — Ambulatory Visit (HOSPITAL_BASED_OUTPATIENT_CLINIC_OR_DEPARTMENT_OTHER): Payer: BC Managed Care – PPO | Admitting: Oncology

## 2020-12-20 ENCOUNTER — Other Ambulatory Visit: Payer: BC Managed Care – PPO

## 2020-12-20 ENCOUNTER — Ambulatory Visit: Payer: BC Managed Care – PPO

## 2020-12-20 DIAGNOSIS — Z7189 Other specified counseling: Secondary | ICD-10-CM

## 2020-12-20 DIAGNOSIS — C7951 Secondary malignant neoplasm of bone: Secondary | ICD-10-CM

## 2020-12-20 DIAGNOSIS — D631 Anemia in chronic kidney disease: Secondary | ICD-10-CM

## 2020-12-20 DIAGNOSIS — N184 Chronic kidney disease, stage 4 (severe): Secondary | ICD-10-CM

## 2020-12-20 DIAGNOSIS — C50411 Malignant neoplasm of upper-outer quadrant of right female breast: Secondary | ICD-10-CM

## 2020-12-20 DIAGNOSIS — Z170 Estrogen receptor positive status [ER+]: Secondary | ICD-10-CM

## 2020-12-20 DIAGNOSIS — Z6841 Body Mass Index (BMI) 40.0 and over, adult: Secondary | ICD-10-CM

## 2020-12-20 DIAGNOSIS — I890 Lymphedema, not elsewhere classified: Secondary | ICD-10-CM

## 2020-12-25 ENCOUNTER — Telehealth: Payer: Self-pay | Admitting: Oncology

## 2020-12-25 ENCOUNTER — Other Ambulatory Visit: Payer: Self-pay | Admitting: Oncology

## 2020-12-25 NOTE — Telephone Encounter (Signed)
LEFT MESSAGE -   Called pt per 1/20 sch msg to reschedule 1/19 appt - left message for patient with appt date and time

## 2020-12-27 ENCOUNTER — Inpatient Hospital Stay: Payer: BC Managed Care – PPO | Attending: Oncology

## 2020-12-27 ENCOUNTER — Other Ambulatory Visit: Payer: Self-pay | Admitting: Adult Health

## 2020-12-27 ENCOUNTER — Other Ambulatory Visit: Payer: Self-pay

## 2020-12-27 ENCOUNTER — Inpatient Hospital Stay (HOSPITAL_BASED_OUTPATIENT_CLINIC_OR_DEPARTMENT_OTHER): Payer: BC Managed Care – PPO | Admitting: Medical

## 2020-12-27 ENCOUNTER — Inpatient Hospital Stay: Payer: BC Managed Care – PPO

## 2020-12-27 VITALS — BP 113/63 | HR 60 | Temp 97.7°F | Resp 13 | Ht 59.0 in | Wt 180.7 lb

## 2020-12-27 DIAGNOSIS — C50411 Malignant neoplasm of upper-outer quadrant of right female breast: Secondary | ICD-10-CM

## 2020-12-27 DIAGNOSIS — Z7189 Other specified counseling: Secondary | ICD-10-CM

## 2020-12-27 DIAGNOSIS — C50911 Malignant neoplasm of unspecified site of right female breast: Secondary | ICD-10-CM | POA: Insufficient documentation

## 2020-12-27 DIAGNOSIS — Z95828 Presence of other vascular implants and grafts: Secondary | ICD-10-CM

## 2020-12-27 DIAGNOSIS — Z170 Estrogen receptor positive status [ER+]: Secondary | ICD-10-CM | POA: Diagnosis not present

## 2020-12-27 DIAGNOSIS — D631 Anemia in chronic kidney disease: Secondary | ICD-10-CM | POA: Diagnosis not present

## 2020-12-27 DIAGNOSIS — Z6841 Body Mass Index (BMI) 40.0 and over, adult: Secondary | ICD-10-CM

## 2020-12-27 DIAGNOSIS — N184 Chronic kidney disease, stage 4 (severe): Secondary | ICD-10-CM | POA: Diagnosis not present

## 2020-12-27 DIAGNOSIS — R1114 Bilious vomiting: Secondary | ICD-10-CM

## 2020-12-27 DIAGNOSIS — Z79899 Other long term (current) drug therapy: Secondary | ICD-10-CM | POA: Insufficient documentation

## 2020-12-27 DIAGNOSIS — C7951 Secondary malignant neoplasm of bone: Secondary | ICD-10-CM

## 2020-12-27 DIAGNOSIS — Z5112 Encounter for antineoplastic immunotherapy: Secondary | ICD-10-CM | POA: Diagnosis not present

## 2020-12-27 DIAGNOSIS — I890 Lymphedema, not elsewhere classified: Secondary | ICD-10-CM

## 2020-12-27 LAB — CBC WITH DIFFERENTIAL/PLATELET
Abs Immature Granulocytes: 0.01 10*3/uL (ref 0.00–0.07)
Basophils Absolute: 0 10*3/uL (ref 0.0–0.1)
Basophils Relative: 0 %
Eosinophils Absolute: 0.1 10*3/uL (ref 0.0–0.5)
Eosinophils Relative: 2 %
HCT: 24.8 % — ABNORMAL LOW (ref 36.0–46.0)
Hemoglobin: 7.8 g/dL — ABNORMAL LOW (ref 12.0–15.0)
Immature Granulocytes: 0 %
Lymphocytes Relative: 24 %
Lymphs Abs: 1.2 10*3/uL (ref 0.7–4.0)
MCH: 28.1 pg (ref 26.0–34.0)
MCHC: 31.5 g/dL (ref 30.0–36.0)
MCV: 89.2 fL (ref 80.0–100.0)
Monocytes Absolute: 0.7 10*3/uL (ref 0.1–1.0)
Monocytes Relative: 14 %
Neutro Abs: 3 10*3/uL (ref 1.7–7.7)
Neutrophils Relative %: 60 %
Platelets: 67 10*3/uL — ABNORMAL LOW (ref 150–400)
RBC: 2.78 MIL/uL — ABNORMAL LOW (ref 3.87–5.11)
RDW: 18.4 % — ABNORMAL HIGH (ref 11.5–15.5)
WBC: 4.9 10*3/uL (ref 4.0–10.5)
nRBC: 0 % (ref 0.0–0.2)

## 2020-12-27 LAB — COMPREHENSIVE METABOLIC PANEL
ALT: 86 U/L — ABNORMAL HIGH (ref 0–44)
AST: 105 U/L — ABNORMAL HIGH (ref 15–41)
Albumin: 3 g/dL — ABNORMAL LOW (ref 3.5–5.0)
Alkaline Phosphatase: 114 U/L (ref 38–126)
Anion gap: 5 (ref 5–15)
BUN: 30 mg/dL — ABNORMAL HIGH (ref 6–20)
CO2: 20 mmol/L — ABNORMAL LOW (ref 22–32)
Calcium: 10.7 mg/dL — ABNORMAL HIGH (ref 8.9–10.3)
Chloride: 111 mmol/L (ref 98–111)
Creatinine, Ser: 1.63 mg/dL — ABNORMAL HIGH (ref 0.44–1.00)
GFR, Estimated: 38 mL/min — ABNORMAL LOW (ref >60)
Glucose, Bld: 101 mg/dL — ABNORMAL HIGH (ref 70–99)
Potassium: 4.5 mmol/L (ref 3.5–5.1)
Sodium: 136 mmol/L (ref 135–145)
Total Bilirubin: 0.6 mg/dL (ref 0.3–1.2)
Total Protein: 7.3 g/dL (ref 6.5–8.1)

## 2020-12-27 LAB — RETICULOCYTES
Immature Retic Fract: 30.6 % — ABNORMAL HIGH (ref 2.3–15.9)
RBC.: 2.69 MIL/uL — ABNORMAL LOW (ref 3.87–5.11)
Retic Count, Absolute: 73.2 10*3/uL (ref 19.0–186.0)
Retic Ct Pct: 2.7 % (ref 0.4–3.1)

## 2020-12-27 LAB — FERRITIN: Ferritin: 98 ng/mL (ref 11–307)

## 2020-12-27 MED ORDER — EPOETIN ALFA-EPBX 10000 UNIT/ML IJ SOLN
10000.0000 [IU] | Freq: Once | INTRAMUSCULAR | Status: AC
  Administered 2020-12-27: 10000 [IU] via SUBCUTANEOUS

## 2020-12-27 MED ORDER — DENOSUMAB 120 MG/1.7ML ~~LOC~~ SOLN
120.0000 mg | Freq: Once | SUBCUTANEOUS | Status: AC
  Administered 2020-12-27: 120 mg via SUBCUTANEOUS

## 2020-12-27 MED ORDER — EPOETIN ALFA-EPBX 40000 UNIT/ML IJ SOLN: 20000.0000 [IU] | Freq: Once | INTRAMUSCULAR | Status: DC

## 2020-12-27 MED ORDER — TRASTUZUMAB-DKST CHEMO 150 MG IV SOLR
6.0000 mg/kg | Freq: Once | INTRAVENOUS | Status: AC
  Administered 2020-12-27: 483 mg via INTRAVENOUS
  Filled 2020-12-27: qty 23

## 2020-12-27 MED ORDER — DIPHENHYDRAMINE HCL 25 MG PO CAPS
25.0000 mg | ORAL_CAPSULE | Freq: Once | ORAL | Status: AC
  Administered 2020-12-27: 25 mg via ORAL

## 2020-12-27 MED ORDER — SODIUM CHLORIDE 0.9 % IV SOLN
Freq: Once | INTRAVENOUS | Status: AC
  Filled 2020-12-27: qty 250

## 2020-12-27 MED ORDER — HEPARIN SOD (PORK) LOCK FLUSH 100 UNIT/ML IV SOLN
500.0000 [IU] | Freq: Once | INTRAVENOUS | Status: AC | PRN
  Administered 2020-12-27: 500 [IU]
  Filled 2020-12-27: qty 5

## 2020-12-27 MED ORDER — SODIUM CHLORIDE 0.9% FLUSH
10.0000 mL | INTRAVENOUS | Status: DC | PRN
  Administered 2020-12-27: 10 mL via INTRAVENOUS
  Filled 2020-12-27: qty 10

## 2020-12-27 MED ORDER — ACETAMINOPHEN 325 MG PO TABS
650.0000 mg | ORAL_TABLET | Freq: Once | ORAL | Status: AC
  Administered 2020-12-27: 650 mg via ORAL

## 2020-12-27 MED ORDER — SODIUM CHLORIDE 0.9% FLUSH
10.0000 mL | INTRAVENOUS | Status: DC | PRN
  Administered 2020-12-27: 10 mL
  Filled 2020-12-27: qty 10

## 2020-12-27 NOTE — Patient Instructions (Signed)
[place] Discharge Instructions for Patients Receiving Chemotherapy  Today you received the following chemotherapy agents: Trastuzumab  To help prevent nausea and vomiting after your treatment, we encourage you to take your nausea medication  as prescribed.    If you develop nausea and vomiting that is not controlled by your nausea medication, call the clinic.   BELOW ARE SYMPTOMS THAT SHOULD BE REPORTED IMMEDIATELY:  *FEVER GREATER THAN 100.5 F  *CHILLS WITH OR WITHOUT FEVER  NAUSEA AND VOMITING THAT IS NOT CONTROLLED WITH YOUR NAUSEA MEDICATION  *UNUSUAL SHORTNESS OF BREATH  *UNUSUAL BRUISING OR BLEEDING  TENDERNESS IN MOUTH AND THROAT WITH OR WITHOUT PRESENCE OF ULCERS  *URINARY PROBLEMS  *BOWEL PROBLEMS  UNUSUAL RASH Items with * indicate a potential emergency and should be followed up as soon as possible.  Feel free to call the clinic should you have any questions or concerns. The clinic phone number is [phone].  Please show the [place] at check-in to the Emergency Department and triage nurse.  Epoetin Alfa injection What is this medicine? EPOETIN ALFA (e POE e tin AL fa) helps your body make more red blood cells. This medicine is used to treat anemia caused by chronic kidney disease, cancer chemotherapy, or HIV-therapy. It may also be used before surgery if you have anemia. This medicine may be used for other purposes; ask your health care provider or pharmacist if you have questions. COMMON BRAND NAME(S): Epogen, Procrit, Retacrit What should I tell my health care provider before I take this medicine? They need to know if you have any of these conditions:  cancer  heart disease  high blood pressure  history of blood clots  history of stroke  low levels of folate, iron, or vitamin B12 in the blood  seizures  an unusual or allergic reaction to erythropoietin, albumin, benzyl alcohol, hamster proteins, other medicines,  foods, dyes, or preservatives  pregnant or trying to get pregnant  breast-feeding How should I use this medicine? This medicine is for injection into a vein or under the skin. It is usually given by a health care professional in a hospital or clinic setting. If you get this medicine at home, you will be taught how to prepare and give this medicine. Use exactly as directed. Take your medicine at regular intervals. Do not take your medicine more often than directed. It is important that you put your used needles and syringes in a special sharps container. Do not put them in a trash can. If you do not have a sharps container, call your pharmacist or healthcare provider to get one. A special MedGuide will be given to you by the pharmacist with each prescription and refill. Be sure to read this information carefully each time. Talk to your pediatrician regarding the use of this medicine in children. While this drug may be prescribed for selected conditions, precautions do apply. Overdosage: If you think you have taken too much of this medicine contact a poison control center or emergency room at once. NOTE: This medicine is only for you. Do not share this medicine with others. What if I miss a dose? If you miss a dose, take it as soon as you can. If it is almost time for your next dose, take only that dose. Do not take double or extra doses. What may interact with this medicine? Interactions have not been studied. This list may not describe all possible interactions. Give your health care provider a list of  all the medicines, herbs, non-prescription drugs, or dietary supplements you use. Also tell them if you smoke, drink alcohol, or use illegal drugs. Some items may interact with your medicine. What should I watch for while using this medicine? Your condition will be monitored carefully while you are receiving this medicine. You may need blood work done while you are taking this medicine. This  medicine may cause a decrease in vitamin B6. You should make sure that you get enough vitamin B6 while you are taking this medicine. Discuss the foods you eat and the vitamins you take with your health care professional. What side effects may I notice from receiving this medicine? Side effects that you should report to your doctor or health care professional as soon as possible:  allergic reactions like skin rash, itching or hives, swelling of the face, lips, or tongue  seizures  signs and symptoms of a blood clot such as breathing problems; changes in vision; chest pain; severe, sudden headache; pain, swelling, warmth in the leg; trouble speaking; sudden numbness or weakness of the face, arm or leg  signs and symptoms of a stroke like changes in vision; confusion; trouble speaking or understanding; severe headaches; sudden numbness or weakness of the face, arm or leg; trouble walking; dizziness; loss of balance or coordination Side effects that usually do not require medical attention (report to your doctor or health care professional if they continue or are bothersome):  chills  cough  dizziness  fever  headaches  joint pain  muscle cramps  muscle pain  nausea, vomiting  pain, redness, or irritation at site where injected This list may not describe all possible side effects. Call your doctor for medical advice about side effects. You may report side effects to FDA at 1-800-FDA-1088. Where should I keep my medicine? Keep out of the reach of children. Store in a refrigerator between 2 and 8 degrees C (36 and 46 degrees F). Do not freeze or shake. Throw away any unused portion if using a single-dose vial. Multi-dose vials can be kept in the refrigerator for up to 21 days after the initial dose. Throw away unused medicine. NOTE: This sheet is a summary. It may not cover all possible information. If you have questions about this medicine, talk to your doctor, pharmacist, or health  care provider.  2021 Elsevier/Gold Standard (2017-06-27 08:35:19)   Denosumab injection What is this medicine? DENOSUMAB (den oh sue mab) slows bone breakdown. Prolia is used to treat osteoporosis in women after menopause and in men, and in people who are taking corticosteroids for 6 months or more. Delton See is used to treat a high calcium level due to cancer and to prevent bone fractures and other bone problems caused by multiple myeloma or cancer bone metastases. Delton See is also used to treat giant cell tumor of the bone. This medicine may be used for other purposes; ask your health care provider or pharmacist if you have questions. COMMON BRAND NAME(S): Prolia, XGEVA What should I tell my health care provider before I take this medicine? They need to know if you have any of these conditions:  dental disease  having surgery or tooth extraction  infection  kidney disease  low levels of calcium or Vitamin D in the blood  malnutrition  on hemodialysis  skin conditions or sensitivity  thyroid or parathyroid disease  an unusual reaction to denosumab, other medicines, foods, dyes, or preservatives  pregnant or trying to get pregnant  breast-feeding How should I use this  medicine? This medicine is for injection under the skin. It is given by a health care professional in a hospital or clinic setting. A special MedGuide will be given to you before each treatment. Be sure to read this information carefully each time. For Prolia, talk to your pediatrician regarding the use of this medicine in children. Special care may be needed. For Delton See, talk to your pediatrician regarding the use of this medicine in children. While this drug may be prescribed for children as young as 13 years for selected conditions, precautions do apply. Overdosage: If you think you have taken too much of this medicine contact a poison control center or emergency room at once. NOTE: This medicine is only for you. Do  not share this medicine with others. What if I miss a dose? It is important not to miss your dose. Call your doctor or health care professional if you are unable to keep an appointment. What may interact with this medicine? Do not take this medicine with any of the following medications:  other medicines containing denosumab This medicine may also interact with the following medications:  medicines that lower your chance of fighting infection  steroid medicines like prednisone or cortisone This list may not describe all possible interactions. Give your health care provider a list of all the medicines, herbs, non-prescription drugs, or dietary supplements you use. Also tell them if you smoke, drink alcohol, or use illegal drugs. Some items may interact with your medicine. What should I watch for while using this medicine? Visit your doctor or health care professional for regular checks on your progress. Your doctor or health care professional may order blood tests and other tests to see how you are doing. Call your doctor or health care professional for advice if you get a fever, chills or sore throat, or other symptoms of a cold or flu. Do not treat yourself. This drug may decrease your body's ability to fight infection. Try to avoid being around people who are sick. You should make sure you get enough calcium and vitamin D while you are taking this medicine, unless your doctor tells you not to. Discuss the foods you eat and the vitamins you take with your health care professional. See your dentist regularly. Brush and floss your teeth as directed. Before you have any dental work done, tell your dentist you are receiving this medicine. Do not become pregnant while taking this medicine or for 5 months after stopping it. Talk with your doctor or health care professional about your birth control options while taking this medicine. Women should inform their doctor if they wish to become pregnant or  think they might be pregnant. There is a potential for serious side effects to an unborn child. Talk to your health care professional or pharmacist for more information. What side effects may I notice from receiving this medicine? Side effects that you should report to your doctor or health care professional as soon as possible:  allergic reactions like skin rash, itching or hives, swelling of the face, lips, or tongue  bone pain  breathing problems  dizziness  jaw pain, especially after dental work  redness, blistering, peeling of the skin  signs and symptoms of infection like fever or chills; cough; sore throat; pain or trouble passing urine  signs of low calcium like fast heartbeat, muscle cramps or muscle pain; pain, tingling, numbness in the hands or feet; seizures  unusual bleeding or bruising  unusually weak or tired Side effects that  usually do not require medical attention (report to your doctor or health care professional if they continue or are bothersome):  constipation  diarrhea  headache  joint pain  loss of appetite  muscle pain  runny nose  tiredness  upset stomach This list may not describe all possible side effects. Call your doctor for medical advice about side effects. You may report side effects to FDA at 1-800-FDA-1088. Where should I keep my medicine? This medicine is only given in a clinic, doctor's office, or other health care setting and will not be stored at home. NOTE: This sheet is a summary. It may not cover all possible information. If you have questions about this medicine, talk to your doctor, pharmacist, or health care provider.  2021 Elsevier/Gold Standard (2018-03-27 16:10:44)

## 2021-01-01 NOTE — Progress Notes (Signed)
[facility] Progress Note   [name] [name] [id number] [birth year] 50 y.o.  [name] [name] is managed by Dr. 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  She additionally will receive Xgeva.  She continues to be followed by Dr. Jana Hakim and is scheduled to return for treatment only on 01/11/2020.  She has no follow-up appointment scheduled.  Anemia secondary to stage IV chronic renal failure: A CBC returned today with a hemoglobin of 7.8 and a hematocrit of 24.8.  She was dosed with Retacrit today.  A follow-up appointment will be scheduled for her at a later date.  Please see After Visit Summary for patient specific instructions.  Future Appointments  Date Time Provider [place]  01/10/2021  8:15 AM [facility] LAB [facility] None  01/10/2021  8:30 AM [facility] FLUSH [facility] None  01/10/2021  9:30 AM [facility] INFUSION [facility] None    No orders of the defined types were placed in this encounter.      Subjective:   Patient ID:  [name] [name] is a 50 y.o. (DOB 06-12-1971) female.  Chief Complaint: No chief complaint on file.   HPI [name] [name]  is a 50 y.o. female with a diagnosis of a metastatic ER positive malignant neoplasm of the right breast with bone metastasis.  She continues to be  followed by Dr. Jana Hakim and presents to the clinic today for consideration of cycle 2, day 1 of Ogivri.  She additionally will receive Xgeva.  She reports that she is doing well overall.  Her energy level is stable.  She denies fevers, chills, sweats, nausea, vomiting, constipation, or diarrhea.  Medications: I have reviewed the patient's current medications.  Allergies:  Allergies  Allergen Reactions  . Ace Inhibitors Swelling    Swelling of lips and face  . Lisinopril Swelling    Swelling of lips, face  . Amlodipine Swelling    Leg swelling  . Shellfish Allergy Swelling  . Adhesive [Tape] Itching and Rash    Please use paper tape  . Latex Itching and Rash    Past Medical History:  Diagnosis Date  . Anxiety   . Breast cancer, left breast ([place]) 2000   Underwent lumpectomy, chemotherapy and radiation  . Facial paralysis/Bells palsy    left side  . Family history of lung cancer   . Family history of lymphoma   . Hypertension   . Neuropathy    IN FINGERS AND TOES   RECENT INFUSION OF CHEMO  . Personal history of breast cancer 05/06/2018  . Renal disorder    just after TIA acute kidney injury  . TIA (transient ischemic attack) 03/16/2017   [facility]    Past Surgical History:  Procedure Laterality Date  . BREAST LUMPECTOMY WITH AXILLARY LYMPH NODE BIOPSY Left 2000   biopsy  . BREAST SURGERY     partial mastectomy with lymph node removal  . ENDOBRONCHIAL ULTRASOUND Bilateral  09/05/2020   Procedure: ENDOBRONCHIAL ULTRASOUND;  Surgeon: Collene Gobble, MD;  Location: [facility];  Service: Cardiopulmonary;  Laterality: Bilateral;  . MASTECTOMY Right 2019   & partial mastectomy left breast 2000  . MASTECTOMY MODIFIED RADICAL Right 08/26/2018   Procedure: MASTECTOMY MODIFIED RADICAL;  Surgeon: Jovita Kussmaul, MD;  Location: [facility];  Service: General;  Laterality: Right;  . MODIFIED MASTECTOMY Right 08/26/2018  . PORTACATH PLACEMENT Left 04/08/2018   Procedure: INSERTION  PORT-A-CATH;  Surgeon: Jovita Kussmaul, MD;  Location: [facility];  Service: General;  Laterality: Left;  . TUBAL LIGATION Bilateral 2004  . VIDEO BRONCHOSCOPY N/A 09/05/2020   Procedure: VIDEO BRONCHOSCOPY WITHOUT FLUORO;  Surgeon: Collene Gobble, MD;  Location: [facility];  Service: Cardiopulmonary;  Laterality: N/A;  . WISDOM TOOTH EXTRACTION      Family History  Problem Relation Age of Onset  . Hypertension Mother   . Hypertension Father   . Diverticulosis Father   . Diabetes Father   . Lung cancer Father 90       hx smoking  . Hypertension Maternal Grandmother   . CAD Maternal Grandmother   . Alzheimer's disease Maternal Grandmother        died at 20  . Hypertension Paternal Grandmother   . CAD Paternal Grandmother   . Stroke Paternal Grandfather   . Sickle cell trait Paternal Grandfather   . Pneumonia Maternal Aunt        died at a young adult  . Lymphoma Paternal Aunt 77    Social History   Socioeconomic History  . Marital status: Married    Spouse name: [name]  . Number of children: 3  . Years of education: 73  . Highest education level: Not on file  Occupational History    Comment: office admin. [employer]  Tobacco Use  . Smoking status: Never Smoker  . Smokeless tobacco: Never Used  Vaping Use  . Vaping Use: Never used  Substance and Sexual Activity  . Alcohol use: No  . Drug use: No  . Sexual activity: Yes    Partners: Male    Birth control/protection: Surgical  Other Topics Concern  . Not on file  Social History Narrative   Lives with family   No caffeine   Social Determinants of Radio broadcast assistant Strain: Not on file  Food Insecurity: Not on file  Transportation Needs: Not on file  Physical Activity: Not on file  Stress: Not on file  Social Connections: Not on file  Intimate Partner Violence: Not on file    Past Medical History, Surgical history, Social history, and Family history were reviewed and updated as appropriate.   Please  see review of systems for further details on the patient's review from today.   Review of Systems:  Review of Systems  Constitutional: Negative for chills, diaphoresis and fever.  HENT: Negative for trouble swallowing and voice change.   Respiratory: Negative for cough, chest tightness, shortness of breath and wheezing.   Cardiovascular: Negative for chest pain and palpitations.  Gastrointestinal: Negative for abdominal pain, constipation, diarrhea, nausea and vomiting.  Musculoskeletal: Negative for back pain and myalgias.  Neurological: Negative for dizziness, light-headedness and headaches.    Objective:   Physical Exam:  BP 113/63 (BP Location: Left Arm, Patient Position: Sitting)   Pulse 60   Temp 97.7 F (36.5 C)   Resp 13   Ht 4\' 11"  (1.499 m)   Wt 180 lb 11.2 oz (82 kg)  LMP 12/21/2017   SpO2 100%   BMI 36.50 kg/m  ECOG: 0  Physical Exam Constitutional:      General: She is not in acute distress.    Appearance: She is not diaphoretic.  HENT:     Head: Normocephalic and atraumatic.  Eyes:     General: No scleral icterus.       Right eye: No discharge.        Left eye: No discharge.     Conjunctiva/sclera: Conjunctivae normal.  Cardiovascular:     Rate and Rhythm: Normal rate and regular rhythm.     Heart sounds: Normal heart sounds. No murmur heard. No friction rub. No gallop.   Pulmonary:     Effort: Pulmonary effort is normal. No respiratory distress.     Breath sounds: Normal breath sounds. No wheezing or rales.  Skin:    General: Skin is warm and dry.     Findings: No erythema or rash.  Neurological:     Mental Status: She is alert.     Coordination: Coordination normal.     Gait: Gait normal.  Psychiatric:        Mood and Affect: Mood normal.        Behavior: Behavior normal.        Thought Content: Thought content normal.        Judgment: Judgment normal.     Lab Review:     Component Value Date/Time   NA 136 12/27/2020 1340   K 4.5  12/27/2020 1340   CL 111 12/27/2020 1340   CO2 20 (L) 12/27/2020 1340   GLUCOSE 101 (H) 12/27/2020 1340   BUN 30 (H) 12/27/2020 1340   CREATININE 1.63 (H) 12/27/2020 1340   CREATININE 1.56 (H) 07/31/2020 1400   CALCIUM 10.7 (H) 12/27/2020 1340   CALCIUM 11.2 (H) 02/24/2020 0830   PROT 7.3 12/27/2020 1340   ALBUMIN 3.0 (L) 12/27/2020 1340   AST 105 (H) 12/27/2020 1340   AST 134 (H) 07/31/2020 1400   ALT 86 (H) 12/27/2020 1340   ALT 81 (H) 07/31/2020 1400   ALKPHOS 114 12/27/2020 1340   BILITOT 0.6 12/27/2020 1340   BILITOT 0.9 07/31/2020 1400   GFRNONAA 38 (L) 12/27/2020 1340   GFRNONAA 39 (L) 07/31/2020 1400   GFRAA 41 (L) 08/30/2020 0808   GFRAA 45 (L) 07/31/2020 1400       Component Value Date/Time   WBC 4.9 12/27/2020 1340   RBC 2.69 (L) 12/27/2020 1340   RBC 2.78 (L) 12/27/2020 1340   HGB 7.8 (L) 12/27/2020 1340   HGB 10.9 (L) 07/31/2020 1400   HCT 24.8 (L) 12/27/2020 1340   PLT 67 (L) 12/27/2020 1340   PLT 97 (L) 07/31/2020 1400   MCV 89.2 12/27/2020 1340   MCH 28.1 12/27/2020 1340   MCHC 31.5 12/27/2020 1340   RDW 18.4 (H) 12/27/2020 1340   LYMPHSABS 1.2 12/27/2020 1340   MONOABS 0.7 12/27/2020 1340   EOSABS 0.1 12/27/2020 1340   BASOSABS 0.0 12/27/2020 1340   -------------------------------  Imaging from last 24 hours (if applicable):  Radiology interpretation: No results found.

## 2021-01-03 ENCOUNTER — Other Ambulatory Visit: Payer: BC Managed Care – PPO

## 2021-01-03 ENCOUNTER — Ambulatory Visit: Payer: BC Managed Care – PPO

## 2021-01-03 ENCOUNTER — Telehealth: Payer: Self-pay | Admitting: Oncology

## 2021-01-03 ENCOUNTER — Telehealth: Payer: Self-pay | Admitting: *Deleted

## 2021-01-03 NOTE — Telephone Encounter (Signed)
This RN sent scheduling request per pt's inquiry to reschedule appointments to every 3 weeks per delay in recent therapy due to the weather.

## 2021-01-03 NOTE — Telephone Encounter (Signed)
Called pt per 2/2 sch msg - no answer. Left message for patient with new appt date and time

## 2021-01-08 ENCOUNTER — Encounter: Payer: Self-pay | Admitting: Oncology

## 2021-01-08 NOTE — Progress Notes (Signed)
Received returned phone call from patient regarding copay assistance.  Advised of the enrollment in copay assistance for Ogivri. Patient was appreciative.  Advised she may received a copy of the approval letter in the mail for her records only and we would handle the claim submissions on our end. She verbalized understanding.  She has my card for any additional financial questions or concerns.

## 2021-01-08 NOTE — Progress Notes (Signed)
Enrolled patient in copay assistance for Ogivri via [place].  Will update once approval letter has been sent.  Called patient and left a voicemail with my contact name and number to advise.

## 2021-01-08 NOTE — Progress Notes (Signed)
Called [employer]([place]) to obtain virtual copay card information.  Left for Lenise to add to approval letter.

## 2021-01-08 NOTE — Progress Notes (Signed)
Received approval letter for copay assistance for Ogivri via fax from [employer].  Patient approved for up to $25,000 01/08/21 - 01/07/22 leaving her with a $0 copay after insurance pays. A copy of the approval letter will be given to [facility] for billing/copay claim submissions.

## 2021-01-10 ENCOUNTER — Other Ambulatory Visit: Payer: BC Managed Care – PPO

## 2021-01-10 ENCOUNTER — Inpatient Hospital Stay: Payer: BC Managed Care – PPO

## 2021-01-17 ENCOUNTER — Inpatient Hospital Stay: Payer: BC Managed Care – PPO

## 2021-01-17 ENCOUNTER — Inpatient Hospital Stay: Payer: BC Managed Care – PPO | Attending: Oncology

## 2021-01-17 ENCOUNTER — Other Ambulatory Visit: Payer: Self-pay

## 2021-01-17 VITALS — BP 104/66 | HR 66 | Temp 99.0°F | Resp 16

## 2021-01-17 DIAGNOSIS — Z170 Estrogen receptor positive status [ER+]: Secondary | ICD-10-CM | POA: Insufficient documentation

## 2021-01-17 DIAGNOSIS — D631 Anemia in chronic kidney disease: Secondary | ICD-10-CM | POA: Insufficient documentation

## 2021-01-17 DIAGNOSIS — C50411 Malignant neoplasm of upper-outer quadrant of right female breast: Secondary | ICD-10-CM

## 2021-01-17 DIAGNOSIS — C7951 Secondary malignant neoplasm of bone: Secondary | ICD-10-CM | POA: Insufficient documentation

## 2021-01-17 DIAGNOSIS — Z79899 Other long term (current) drug therapy: Secondary | ICD-10-CM | POA: Insufficient documentation

## 2021-01-17 DIAGNOSIS — Z95828 Presence of other vascular implants and grafts: Secondary | ICD-10-CM

## 2021-01-17 DIAGNOSIS — N184 Chronic kidney disease, stage 4 (severe): Secondary | ICD-10-CM | POA: Insufficient documentation

## 2021-01-17 DIAGNOSIS — Z5112 Encounter for antineoplastic immunotherapy: Secondary | ICD-10-CM | POA: Diagnosis not present

## 2021-01-17 LAB — COMPREHENSIVE METABOLIC PANEL
ALT: 88 U/L — ABNORMAL HIGH (ref 0–44)
AST: 112 U/L — ABNORMAL HIGH (ref 15–41)
Albumin: 3.1 g/dL — ABNORMAL LOW (ref 3.5–5.0)
Alkaline Phosphatase: 135 U/L — ABNORMAL HIGH (ref 38–126)
Anion gap: 7 (ref 5–15)
BUN: 43 mg/dL — ABNORMAL HIGH (ref 6–20)
CO2: 18 mmol/L — ABNORMAL LOW (ref 22–32)
Calcium: 10.1 mg/dL (ref 8.9–10.3)
Chloride: 111 mmol/L (ref 98–111)
Creatinine, Ser: 1.78 mg/dL — ABNORMAL HIGH (ref 0.44–1.00)
GFR, Estimated: 35 mL/min — ABNORMAL LOW (ref >60)
Glucose, Bld: 102 mg/dL — ABNORMAL HIGH (ref 70–99)
Potassium: 4.4 mmol/L (ref 3.5–5.1)
Sodium: 136 mmol/L (ref 135–145)
Total Bilirubin: 0.6 mg/dL (ref 0.3–1.2)
Total Protein: 7.6 g/dL (ref 6.5–8.1)

## 2021-01-17 LAB — CBC WITH DIFFERENTIAL/PLATELET
Abs Immature Granulocytes: 0.02 10*3/uL (ref 0.00–0.07)
Basophils Absolute: 0 10*3/uL (ref 0.0–0.1)
Basophils Relative: 1 %
Eosinophils Absolute: 0.1 10*3/uL (ref 0.0–0.5)
Eosinophils Relative: 2 %
HCT: 25.2 % — ABNORMAL LOW (ref 36.0–46.0)
Hemoglobin: 8.2 g/dL — ABNORMAL LOW (ref 12.0–15.0)
Immature Granulocytes: 0 %
Lymphocytes Relative: 23 %
Lymphs Abs: 1.1 10*3/uL (ref 0.7–4.0)
MCH: 28.8 pg (ref 26.0–34.0)
MCHC: 32.5 g/dL (ref 30.0–36.0)
MCV: 88.4 fL (ref 80.0–100.0)
Monocytes Absolute: 0.8 10*3/uL (ref 0.1–1.0)
Monocytes Relative: 17 %
Neutro Abs: 2.8 10*3/uL (ref 1.7–7.7)
Neutrophils Relative %: 57 %
Platelets: 81 10*3/uL — ABNORMAL LOW (ref 150–400)
RBC: 2.85 MIL/uL — ABNORMAL LOW (ref 3.87–5.11)
RDW: 18.2 % — ABNORMAL HIGH (ref 11.5–15.5)
WBC: 4.7 10*3/uL (ref 4.0–10.5)
nRBC: 0 % (ref 0.0–0.2)

## 2021-01-17 MED ORDER — TRASTUZUMAB-DKST CHEMO 150 MG IV SOLR
6.0000 mg/kg | Freq: Once | INTRAVENOUS | Status: AC
  Administered 2021-01-17: 483 mg via INTRAVENOUS
  Filled 2021-01-17: qty 23

## 2021-01-17 MED ORDER — SODIUM CHLORIDE 0.9% FLUSH
10.0000 mL | INTRAVENOUS | Status: DC | PRN
  Administered 2021-01-17: 10 mL
  Filled 2021-01-17: qty 10

## 2021-01-17 MED ORDER — ACETAMINOPHEN 325 MG PO TABS
650.0000 mg | ORAL_TABLET | Freq: Once | ORAL | Status: AC
  Administered 2021-01-17: 650 mg via ORAL

## 2021-01-17 MED ORDER — EPOETIN ALFA-EPBX 10000 UNIT/ML IJ SOLN
INTRAMUSCULAR | Status: AC
  Filled 2021-01-17: qty 2

## 2021-01-17 MED ORDER — SODIUM CHLORIDE 0.9% FLUSH
10.0000 mL | Freq: Once | INTRAVENOUS | Status: AC
  Administered 2021-01-17: 10 mL via INTRAVENOUS
  Filled 2021-01-17: qty 10

## 2021-01-17 MED ORDER — DIPHENHYDRAMINE HCL 25 MG PO CAPS
ORAL_CAPSULE | ORAL | Status: AC
  Filled 2021-01-17: qty 1

## 2021-01-17 MED ORDER — EPOETIN ALFA-EPBX 40000 UNIT/ML IJ SOLN: 20000.0000 [IU] | Freq: Once | INTRAMUSCULAR | Status: DC

## 2021-01-17 MED ORDER — EPOETIN ALFA-EPBX 10000 UNIT/ML IJ SOLN
20000.0000 [IU] | Freq: Once | INTRAMUSCULAR | Status: AC
  Administered 2021-01-17: 20000 [IU] via SUBCUTANEOUS

## 2021-01-17 MED ORDER — SODIUM CHLORIDE 0.9 % IV SOLN
Freq: Once | INTRAVENOUS | Status: AC
  Filled 2021-01-17: qty 250

## 2021-01-17 MED ORDER — ACETAMINOPHEN 325 MG PO TABS
ORAL_TABLET | ORAL | Status: AC
  Filled 2021-01-17: qty 2

## 2021-01-17 MED ORDER — DIPHENHYDRAMINE HCL 25 MG PO CAPS
25.0000 mg | ORAL_CAPSULE | Freq: Once | ORAL | Status: AC
  Administered 2021-01-17: 25 mg via ORAL

## 2021-01-17 MED ORDER — HEPARIN SOD (PORK) LOCK FLUSH 100 UNIT/ML IV SOLN
500.0000 [IU] | Freq: Once | INTRAVENOUS | Status: AC | PRN
  Administered 2021-01-17: 500 [IU]
  Filled 2021-01-17: qty 5

## 2021-01-17 NOTE — Patient Instructions (Signed)
[facility] °Discharge Instructions for Patients Receiving Chemotherapy ° °Today you received the following chemotherapy agents Trastuzumab ° °To help prevent nausea and vomiting after your treatment, we encourage you to take your nausea medication as directed. °  °If you develop nausea and vomiting that is not controlled by your nausea medication, call the clinic.  ° °BELOW ARE SYMPTOMS THAT SHOULD BE REPORTED IMMEDIATELY: °· *FEVER GREATER THAN 100.5 F °· *CHILLS WITH OR WITHOUT FEVER °· NAUSEA AND VOMITING THAT IS NOT CONTROLLED WITH YOUR NAUSEA MEDICATION °· *UNUSUAL SHORTNESS OF BREATH °· *UNUSUAL BRUISING OR BLEEDING °· TENDERNESS IN MOUTH AND THROAT WITH OR WITHOUT PRESENCE OF ULCERS °· *URINARY PROBLEMS °· *BOWEL PROBLEMS °· UNUSUAL RASH °Items with * indicate a potential emergency and should be followed up as soon as possible. ° °Feel free to call the clinic should you have any questions or concerns. The clinic phone number is [phone]. ° °Please show the CHEMO ALERT CARD at check-in to the Emergency Department and triage nurse. ° ° °

## 2021-01-23 ENCOUNTER — Other Ambulatory Visit: Payer: Self-pay | Admitting: Oncology

## 2021-01-23 DIAGNOSIS — C50411 Malignant neoplasm of upper-outer quadrant of right female breast: Secondary | ICD-10-CM

## 2021-01-23 DIAGNOSIS — R1114 Bilious vomiting: Secondary | ICD-10-CM

## 2021-01-23 DIAGNOSIS — Z170 Estrogen receptor positive status [ER+]: Secondary | ICD-10-CM

## 2021-01-31 ENCOUNTER — Ambulatory Visit: Payer: BC Managed Care – PPO | Admitting: Oncology

## 2021-01-31 ENCOUNTER — Other Ambulatory Visit: Payer: BC Managed Care – PPO

## 2021-01-31 ENCOUNTER — Ambulatory Visit: Payer: BC Managed Care – PPO

## 2021-02-07 ENCOUNTER — Inpatient Hospital Stay: Payer: BC Managed Care – PPO

## 2021-02-07 ENCOUNTER — Inpatient Hospital Stay: Payer: BC Managed Care – PPO | Attending: Oncology

## 2021-02-07 ENCOUNTER — Inpatient Hospital Stay (HOSPITAL_BASED_OUTPATIENT_CLINIC_OR_DEPARTMENT_OTHER): Payer: BC Managed Care – PPO | Admitting: Oncology

## 2021-02-07 ENCOUNTER — Other Ambulatory Visit: Payer: Self-pay

## 2021-02-07 VITALS — BP 114/55 | HR 65 | Temp 97.3°F | Resp 18 | Ht 59.0 in | Wt 182.5 lb

## 2021-02-07 DIAGNOSIS — Z79899 Other long term (current) drug therapy: Secondary | ICD-10-CM | POA: Insufficient documentation

## 2021-02-07 DIAGNOSIS — D631 Anemia in chronic kidney disease: Secondary | ICD-10-CM | POA: Insufficient documentation

## 2021-02-07 DIAGNOSIS — Z5112 Encounter for antineoplastic immunotherapy: Secondary | ICD-10-CM | POA: Diagnosis not present

## 2021-02-07 DIAGNOSIS — Z95828 Presence of other vascular implants and grafts: Secondary | ICD-10-CM

## 2021-02-07 DIAGNOSIS — Z7189 Other specified counseling: Secondary | ICD-10-CM

## 2021-02-07 DIAGNOSIS — C50411 Malignant neoplasm of upper-outer quadrant of right female breast: Secondary | ICD-10-CM

## 2021-02-07 DIAGNOSIS — Z170 Estrogen receptor positive status [ER+]: Secondary | ICD-10-CM | POA: Insufficient documentation

## 2021-02-07 DIAGNOSIS — C7951 Secondary malignant neoplasm of bone: Secondary | ICD-10-CM | POA: Insufficient documentation

## 2021-02-07 DIAGNOSIS — C7931 Secondary malignant neoplasm of brain: Secondary | ICD-10-CM | POA: Insufficient documentation

## 2021-02-07 DIAGNOSIS — Z6841 Body Mass Index (BMI) 40.0 and over, adult: Secondary | ICD-10-CM

## 2021-02-07 DIAGNOSIS — C50811 Malignant neoplasm of overlapping sites of right female breast: Secondary | ICD-10-CM | POA: Insufficient documentation

## 2021-02-07 DIAGNOSIS — N184 Chronic kidney disease, stage 4 (severe): Secondary | ICD-10-CM | POA: Diagnosis not present

## 2021-02-07 LAB — COMPREHENSIVE METABOLIC PANEL
ALT: 95 U/L — ABNORMAL HIGH (ref 0–44)
AST: 123 U/L — ABNORMAL HIGH (ref 15–41)
Albumin: 3.1 g/dL — ABNORMAL LOW (ref 3.5–5.0)
Alkaline Phosphatase: 133 U/L — ABNORMAL HIGH (ref 38–126)
Anion gap: 9 (ref 5–15)
BUN: 41 mg/dL — ABNORMAL HIGH (ref 6–20)
CO2: 19 mmol/L — ABNORMAL LOW (ref 22–32)
Calcium: 10.7 mg/dL — ABNORMAL HIGH (ref 8.9–10.3)
Chloride: 109 mmol/L (ref 98–111)
Creatinine, Ser: 1.71 mg/dL — ABNORMAL HIGH (ref 0.44–1.00)
GFR, Estimated: 36 mL/min — ABNORMAL LOW (ref >60)
Glucose, Bld: 132 mg/dL — ABNORMAL HIGH (ref 70–99)
Potassium: 4.3 mmol/L (ref 3.5–5.1)
Sodium: 137 mmol/L (ref 135–145)
Total Bilirubin: 0.6 mg/dL (ref 0.3–1.2)
Total Protein: 7.4 g/dL (ref 6.5–8.1)

## 2021-02-07 LAB — CBC WITH DIFFERENTIAL/PLATELET
Basophils Absolute: 0 10*3/uL (ref 0.0–0.1)
Basophils Relative: 1 %
Eosinophils Absolute: 0.1 10*3/uL (ref 0.0–0.5)
Eosinophils Relative: 2 %
HCT: 27.3 % — ABNORMAL LOW (ref 36.0–46.0)
Hemoglobin: 8.8 g/dL — ABNORMAL LOW (ref 12.0–15.0)
Lymphocytes Relative: 24 %
Lymphs Abs: 1 10*3/uL (ref 0.7–4.0)
MCH: 28.7 pg (ref 26.0–34.0)
MCHC: 32.2 g/dL (ref 30.0–36.0)
MCV: 88.9 fL (ref 80.0–100.0)
Monocytes Absolute: 0.4 10*3/uL (ref 0.1–1.0)
Monocytes Relative: 10 %
Neutro Abs: 2.5 10*3/uL (ref 1.7–7.7)
Neutrophils Relative %: 63 %
Platelets: 75 10*3/uL — ABNORMAL LOW (ref 150–400)
RBC: 3.07 MIL/uL — ABNORMAL LOW (ref 3.87–5.11)
RDW: 17.7 % — ABNORMAL HIGH (ref 11.5–15.5)
WBC: 4 10*3/uL (ref 4.0–10.5)
nRBC: 0 % (ref 0.0–0.2)

## 2021-02-07 MED ORDER — SODIUM CHLORIDE 0.9 % IV SOLN
Freq: Once | INTRAVENOUS | Status: AC
  Filled 2021-02-07: qty 250

## 2021-02-07 MED ORDER — DIPHENHYDRAMINE HCL 25 MG PO CAPS
ORAL_CAPSULE | ORAL | Status: AC
  Filled 2021-02-07: qty 1

## 2021-02-07 MED ORDER — HEPARIN SOD (PORK) LOCK FLUSH 100 UNIT/ML IV SOLN
500.0000 [IU] | Freq: Once | INTRAVENOUS | Status: AC | PRN
  Administered 2021-02-07: 500 [IU]
  Filled 2021-02-07: qty 5

## 2021-02-07 MED ORDER — SODIUM CHLORIDE 0.9% FLUSH
10.0000 mL | INTRAVENOUS | Status: DC | PRN
  Administered 2021-02-07: 10 mL
  Filled 2021-02-07: qty 10

## 2021-02-07 MED ORDER — TRASTUZUMAB-DKST CHEMO 150 MG IV SOLR
6.0000 mg/kg | Freq: Once | INTRAVENOUS | Status: AC
  Administered 2021-02-07: 483 mg via INTRAVENOUS
  Filled 2021-02-07: qty 23

## 2021-02-07 MED ORDER — ACETAMINOPHEN 325 MG PO TABS
650.0000 mg | ORAL_TABLET | Freq: Once | ORAL | Status: AC
  Administered 2021-02-07: 650 mg via ORAL

## 2021-02-07 MED ORDER — EPOETIN ALFA-EPBX 10000 UNIT/ML IJ SOLN
INTRAMUSCULAR | Status: AC
  Filled 2021-02-07: qty 2

## 2021-02-07 MED ORDER — DENOSUMAB 120 MG/1.7ML ~~LOC~~ SOLN
120.0000 mg | Freq: Once | SUBCUTANEOUS | Status: AC
  Administered 2021-02-07: 120 mg via SUBCUTANEOUS

## 2021-02-07 MED ORDER — ACETAMINOPHEN 325 MG PO TABS
ORAL_TABLET | ORAL | Status: AC
  Filled 2021-02-07: qty 2

## 2021-02-07 MED ORDER — EPOETIN ALFA-EPBX 10000 UNIT/ML IJ SOLN
20000.0000 [IU] | Freq: Once | INTRAMUSCULAR | Status: AC
  Administered 2021-02-07: 20000 [IU] via SUBCUTANEOUS

## 2021-02-07 MED ORDER — DENOSUMAB 120 MG/1.7ML ~~LOC~~ SOLN
SUBCUTANEOUS | Status: AC
  Filled 2021-02-07: qty 1.7

## 2021-02-07 MED ORDER — DIPHENHYDRAMINE HCL 25 MG PO CAPS
25.0000 mg | ORAL_CAPSULE | Freq: Once | ORAL | Status: AC
  Administered 2021-02-07: 25 mg via ORAL

## 2021-02-07 MED ORDER — SODIUM CHLORIDE 0.9% FLUSH
10.0000 mL | INTRAVENOUS | Status: DC | PRN
  Administered 2021-02-07: 10 mL via INTRAVENOUS
  Filled 2021-02-07: qty 10

## 2021-02-07 NOTE — Patient Instructions (Signed)
[facility] Discharge Instructions for Patients Receiving Chemotherapy  Today you received the following chemotherapy agents Trastuzumab  To help prevent nausea and vomiting after your treatment, we encourage you to take your nausea medication as directed.    If you develop nausea and vomiting that is not controlled by your nausea medication, call the clinic.   BELOW ARE SYMPTOMS THAT SHOULD BE REPORTED IMMEDIATELY:  *FEVER GREATER THAN 100.5 F  *CHILLS WITH OR WITHOUT FEVER  NAUSEA AND VOMITING THAT IS NOT CONTROLLED WITH YOUR NAUSEA MEDICATION  *UNUSUAL SHORTNESS OF BREATH  *UNUSUAL BRUISING OR BLEEDING  TENDERNESS IN MOUTH AND THROAT WITH OR WITHOUT PRESENCE OF ULCERS  *URINARY PROBLEMS  *BOWEL PROBLEMS  UNUSUAL RASH Items with * indicate a potential emergency and should be followed up as soon as possible.  Feel free to call the clinic should you have any questions or concerns. The clinic phone number is [phone].  Please show the [facility] at check-in to the Emergency Department and triage nurse.  Denosumab injection What is this medicine? DENOSUMAB (den oh sue mab) slows bone breakdown. Prolia is used to treat osteoporosis in women after menopause and in men, and in people who are taking corticosteroids for 6 months or more. Delton See is used to treat a high calcium level due to cancer and to prevent bone fractures and other bone problems caused by multiple myeloma or cancer bone metastases. Delton See is also used to treat giant cell tumor of the bone. This medicine may be used for other purposes; ask your health care provider or pharmacist if you have questions. COMMON BRAND NAME(S): Prolia, XGEVA What should I tell my health care provider before I take this medicine? They need to know if you have any of these conditions:  dental disease  having surgery or tooth extraction  infection  kidney disease  low levels of calcium or Vitamin D in the  blood  malnutrition  on hemodialysis  skin conditions or sensitivity  thyroid or parathyroid disease  an unusual reaction to denosumab, other medicines, foods, dyes, or preservatives  pregnant or trying to get pregnant  breast-feeding How should I use this medicine? This medicine is for injection under the skin. It is given by a health care professional in a hospital or clinic setting. A special MedGuide will be given to you before each treatment. Be sure to read this information carefully each time. For Prolia, talk to your pediatrician regarding the use of this medicine in children. Special care may be needed. For Delton See, talk to your pediatrician regarding the use of this medicine in children. While this drug may be prescribed for children as young as 13 years for selected conditions, precautions do apply. Overdosage: If you think you have taken too much of this medicine contact a poison control center or emergency room at once. NOTE: This medicine is only for you. Do not share this medicine with others. What if I miss a dose? It is important not to miss your dose. Call your doctor or health care professional if you are unable to keep an appointment. What may interact with this medicine? Do not take this medicine with any of the following medications:  other medicines containing denosumab This medicine may also interact with the following medications:  medicines that lower your chance of fighting infection  steroid medicines like prednisone or cortisone This list may not describe all possible interactions. Give your health care provider a list of all the medicines, herbs,  non-prescription drugs, or dietary supplements you use. Also tell them if you smoke, drink alcohol, or use illegal drugs. Some items may interact with your medicine. What should I watch for while using this medicine? Visit your doctor or health care professional for regular checks on your progress. Your doctor or  health care professional may order blood tests and other tests to see how you are doing. Call your doctor or health care professional for advice if you get a fever, chills or sore throat, or other symptoms of a cold or flu. Do not treat yourself. This drug may decrease your body's ability to fight infection. Try to avoid being around people who are sick. You should make sure you get enough calcium and vitamin D while you are taking this medicine, unless your doctor tells you not to. Discuss the foods you eat and the vitamins you take with your health care professional. See your dentist regularly. Brush and floss your teeth as directed. Before you have any dental work done, tell your dentist you are receiving this medicine. Do not become pregnant while taking this medicine or for 5 months after stopping it. Talk with your doctor or health care professional about your birth control options while taking this medicine. Women should inform their doctor if they wish to become pregnant or think they might be pregnant. There is a potential for serious side effects to an unborn child. Talk to your health care professional or pharmacist for more information. What side effects may I notice from receiving this medicine? Side effects that you should report to your doctor or health care professional as soon as possible:  allergic reactions like skin rash, itching or hives, swelling of the face, lips, or tongue  bone pain  breathing problems  dizziness  jaw pain, especially after dental work  redness, blistering, peeling of the skin  signs and symptoms of infection like fever or chills; cough; sore throat; pain or trouble passing urine  signs of low calcium like fast heartbeat, muscle cramps or muscle pain; pain, tingling, numbness in the hands or feet; seizures  unusual bleeding or bruising  unusually weak or tired Side effects that usually do not require medical attention (report to your doctor or  health care professional if they continue or are bothersome):  constipation  diarrhea  headache  joint pain  loss of appetite  muscle pain  runny nose  tiredness  upset stomach This list may not describe all possible side effects. Call your doctor for medical advice about side effects. You may report side effects to FDA at 1-800-FDA-1088. Where should I keep my medicine? This medicine is only given in a clinic, doctor's office, or other health care setting and will not be stored at home. NOTE: This sheet is a summary. It may not cover all possible information. If you have questions about this medicine, talk to your doctor, pharmacist, or health care provider.  2021 [employer]/Gold Standard (2018-03-27 16:10:44)  Epoetin Alfa injection What is this medicine? EPOETIN ALFA (e POE e tin AL fa) helps your body make more red blood cells. This medicine is used to treat anemia caused by chronic kidney disease, cancer chemotherapy, or HIV-therapy. It may also be used before surgery if you have anemia. This medicine may be used for other purposes; ask your health care provider or pharmacist if you have questions. COMMON BRAND NAME(S): Epogen, Procrit, Retacrit What should I tell my health care provider before I take this medicine? They need to know   if you have any of these conditions:  cancer  heart disease  high blood pressure  history of blood clots  history of stroke  low levels of folate, iron, or vitamin B12 in the blood  seizures  an unusual or allergic reaction to erythropoietin, albumin, benzyl alcohol, hamster proteins, other medicines, foods, dyes, or preservatives  pregnant or trying to get pregnant  breast-feeding How should I use this medicine? This medicine is for injection into a vein or under the skin. It is usually given by a health care professional in a hospital or clinic setting. If you get this medicine at home, you will be taught how to prepare and  give this medicine. Use exactly as directed. Take your medicine at regular intervals. Do not take your medicine more often than directed. It is important that you put your used needles and syringes in a special sharps container. Do not put them in a trash can. If you do not have a sharps container, call your pharmacist or healthcare provider to get one. A special MedGuide will be given to you by the pharmacist with each prescription and refill. Be sure to read this information carefully each time. Talk to your pediatrician regarding the use of this medicine in children. While this drug may be prescribed for selected conditions, precautions do apply. Overdosage: If you think you have taken too much of this medicine contact a poison control center or emergency room at once. NOTE: This medicine is only for you. Do not share this medicine with others. What if I miss a dose? If you miss a dose, take it as soon as you can. If it is almost time for your next dose, take only that dose. Do not take double or extra doses. What may interact with this medicine? Interactions have not been studied. This list may not describe all possible interactions. Give your health care provider a list of all the medicines, herbs, non-prescription drugs, or dietary supplements you use. Also tell them if you smoke, drink alcohol, or use illegal drugs. Some items may interact with your medicine. What should I watch for while using this medicine? Your condition will be monitored carefully while you are receiving this medicine. You may need blood work done while you are taking this medicine. This medicine may cause a decrease in vitamin B6. You should make sure that you get enough vitamin B6 while you are taking this medicine. Discuss the foods you eat and the vitamins you take with your health care professional. What side effects may I notice from receiving this medicine? Side effects that you should report to your doctor or  health care professional as soon as possible:  allergic reactions like skin rash, itching or hives, swelling of the face, lips, or tongue  seizures  signs and symptoms of a blood clot such as breathing problems; changes in vision; chest pain; severe, sudden headache; pain, swelling, warmth in the leg; trouble speaking; sudden numbness or weakness of the face, arm or leg  signs and symptoms of a stroke like changes in vision; confusion; trouble speaking or understanding; severe headaches; sudden numbness or weakness of the face, arm or leg; trouble walking; dizziness; loss of balance or coordination Side effects that usually do not require medical attention (report to your doctor or health care professional if they continue or are bothersome):  chills  cough  dizziness  fever  headaches  joint pain  muscle cramps  muscle pain  nausea, vomiting  pain, redness,  or irritation at site where injected This list may not describe all possible side effects. Call your doctor for medical advice about side effects. You may report side effects to FDA at 1-800-FDA-1088. Where should I keep my medicine? Keep out of the reach of children. Store in a refrigerator between 2 and 8 degrees C (36 and 46 degrees F). Do not freeze or shake. Throw away any unused portion if using a single-dose vial. Multi-dose vials can be kept in the refrigerator for up to 21 days after the initial dose. Throw away unused medicine. NOTE: This sheet is a summary. It may not cover all possible information. If you have questions about this medicine, talk to your doctor, pharmacist, or health care provider.  2021 [employer]/Gold Standard (2017-06-27 08:35:19)

## 2021-02-07 NOTE — Patient Instructions (Signed)

## 2021-02-07 NOTE — Progress Notes (Signed)
[place]  [phone] [phone]    ID: [name] [name] DOB: May 10, 1971  MR#: [id number]  JYN#:[id number]  Patient Care Team: Marda Stalker, PA-C as PCP - General (Family Medicine) Jerline Pain, MD as PCP - Cardiology (Cardiology) Magrinat, Virgie Dad, MD as Consulting Physician (Oncology) Loreta Ave, MD as Referring Physician (Internal Medicine) Donato Heinz, MD as Consulting Physician (Nephrology) Kyung Rudd, MD as Consulting Physician (Radiation Oncology) Jovita Kussmaul, MD as Consulting Physician (General Surgery) Larey Dresser, MD as Consulting Physician (Cardiology) Mauro Kaufmann, RN as Oncology Nurse Navigator Rockwell Germany, RN as Oncology Nurse Navigator OTHER MD:   CHIEF COMPLAINT: Triple positive breast cancer (s/p right mastectomy)  CURRENT TREATMENT: trastuzumab; anastrozole; Xgeva (Q 6 w); retacrit   INTERVAL HISTORY: [name] returns today for follow-up and treatment of her triple positive breast cancer.   She was switched back from T-DM1 to herceptin on 11/29/2020 after receiving 14 doses of T-DM1.   Repeat echocardiogram on 11/10/2020 showed an ejection fraction of 60-65%.  I have entered an order for repeat echocardiography this month.  She continues on anastrozole.  She has no unusual side effects from this other than mild vaginal dryness which also of course is due to menopause  She last received denosumab/Xgeva on 12/27/2020.  We are doing this every 6 weeks.  She has no side effects from this that she is aware of.  She has a dose due today  Since her last visit, she has not undergone any additional studies. She is scheduled for restaging PET scan on 02/15/2021.   REVIEW OF SYSTEMS: [name] is doing her normal activities, getting her kids to school, doing housework, and doing a little bit of painting.  She misses her job and wishes she could go back to work but of course she would lose her disability and  her cancer can become active again at any point which would put her in a very difficult situation.  She tells me her back pain is gone.  She did get burned using a hot pad in that area and now feels itchy.  Aside from these issues a detailed review of systems today is stable   COVID 19 VACCINATION STATUS: fully vaccinated [employer]), most recently April 2021.   HISTORY OF CURRENT ILLNESS: From the original intake note:  [name] [name] palpated a lump on 01/15/2018. She felt soreness and shooting pain under her arm and in the right breast. She underwent bilateral diagnostic mammography with tomography and right breast ultrasonography at [facility] on 03/12/2018 showing: breast density category C. The is architectural distortion at the 11 o'clock position. There is also an oval mass in the right breast anterior depth. Examination of the right axilla showed enlarged lymph nodes. Ultrasonography showed a 4.6 cm mass in the right breast upper outer quadrant posterior depth. An additional 1.2 cm oval mass in the right breast 12 o'clock middle depth. The lymph nodes in the right axillary are highly suggestive of malignancy.   Accordingly on 03/16/2018 she proceeded to biopsy of the right breast area in question. The pathology from this procedure showed ([id number]): At both the 11 and 12 o'clock positions: Invasive ductal carcinoma grade II. Ductal carcinoma in situ, high grade, with necrosis and calcifications. Prognostic indicators significant for: estrogen receptor, 90% positive and progesterone receptor, 40% positive, both with strong staining intensity. Proliferation marker Ki67 at 80%. HER2 amplified with ratios HER2/CEP17 SIGNALS 6.90 and average HER2 copies per cell 14.50  The patient's  subsequent history is as detailed below.   PAST MEDICAL HISTORY: Past Medical History:  Diagnosis Date  . Anxiety   . Breast cancer, left breast ([place]) 2000   Underwent lumpectomy, chemotherapy and radiation  . Facial  paralysis/Bells palsy    left side  . Family history of lung cancer   . Family history of lymphoma   . Hypertension   . Neuropathy    IN FINGERS AND TOES   RECENT INFUSION OF CHEMO  . Personal history of breast cancer 05/06/2018  . Renal disorder    just after TIA acute kidney injury  . TIA (transient ischemic attack) 03/16/2017   [facility]  The patient has a previous history of left breast cancer diagnosed in 2000. She was seen by [facility] in [place], [place] under Dr. Loreta Ave. According to the patient, her left breast cancer was stage II with no lymph node involvement (likely T2N0). She had chemotherapy every 3 weeks for about 6 months. She did not takes any "red drugs." She also did not take anti-estrogens (likely ER/PR negative).   TIA in 2018. She saw a neurologist as a precaution. She denies any clotting issues.    PAST SURGICAL HISTORY: Past Surgical History:  Procedure Laterality Date  . BREAST LUMPECTOMY WITH AXILLARY LYMPH NODE BIOPSY Left 2000   biopsy  . BREAST SURGERY     partial mastectomy with lymph node removal  . ENDOBRONCHIAL ULTRASOUND Bilateral 09/05/2020   Procedure: ENDOBRONCHIAL ULTRASOUND;  Surgeon: Collene Gobble, MD;  Location: [facility];  Service: Cardiopulmonary;  Laterality: Bilateral;  . MASTECTOMY Right 2019   & partial mastectomy left breast 2000  . MASTECTOMY MODIFIED RADICAL Right 08/26/2018   Procedure: MASTECTOMY MODIFIED RADICAL;  Surgeon: Jovita Kussmaul, MD;  Location: [facility];  Service: General;  Laterality: Right;  . MODIFIED MASTECTOMY Right 08/26/2018  . PORTACATH PLACEMENT Left 04/08/2018   Procedure: INSERTION PORT-A-CATH;  Surgeon: Jovita Kussmaul, MD;  Location: [facility];  Service: General;  Laterality: Left;  . TUBAL LIGATION Bilateral 2004  . VIDEO BRONCHOSCOPY N/A 09/05/2020   Procedure: VIDEO BRONCHOSCOPY WITHOUT FLUORO;  Surgeon: Collene Gobble, MD;  Location: [facility];  Service: Cardiopulmonary;   Laterality: N/A;  . WISDOM TOOTH EXTRACTION      FAMILY HISTORY Family History  Problem Relation Age of Onset  . Hypertension Mother   . Hypertension Father   . Diverticulosis Father   . Diabetes Father   . Lung cancer Father 89       hx smoking  . Hypertension Maternal Grandmother   . CAD Maternal Grandmother   . Alzheimer's disease Maternal Grandmother        died at 80  . Hypertension Paternal Grandmother   . CAD Paternal Grandmother   . Stroke Paternal Grandfather   . Sickle cell trait Paternal Grandfather   . Pneumonia Maternal Aunt        died at a young adult  . Lymphoma Paternal Aunt 96  The patient's father is alive at age 80 and has a history of lung cancer. The patient's mother is alive at age 52. The patient has 1 brother and no sisters. She denies a family history of breast or ovarian cancer.     GYNECOLOGIC HISTORY:  Patient's last menstrual period was 12/21/2017. Menarche: 50 years old Age at first live birth: 50 years old She is GXP3.  Her LMP was  January 2019.  The patient used oral contraceptive from 1991-1993 and the  Depo-provera shot from 7564-3329 with no complications. She never used HRT.  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  She also worked at [employer] as a Occupational psychologist.  She is now disabled.  Her husband, [name], is a Administrator. The patient's son, Shea Stakes age 21, works at [employer] in [place]. The patient's daughters, Lilia Pro age 3, and Levonne Spiller age 69, are students. The patient's adopted son, Vonna Kotyk age 53, is also a Ship broker.    ADVANCED DIRECTIVES: In the absence of any documents to the contrary the patient's husband is her healthcare power of attorney   HEALTH MAINTENANCE: Social History   Tobacco Use  . Smoking status: Never Smoker  . Smokeless tobacco: Never Used  Vaping Use  . Vaping Use: Never used  Substance Use Topics  .  Alcohol use: No  . Drug use: No     Colonoscopy: n/a  PAP: 2015  Bone density: never done   Allergies  Allergen Reactions  . Ace Inhibitors Swelling    Swelling of lips and face  . Lisinopril Swelling    Swelling of lips, face  . Amlodipine Swelling    Leg swelling  . Shellfish Allergy Swelling  . Adhesive [Tape] Itching and Rash    Please use paper tape  . Latex Itching and Rash    Current Outpatient Medications  Medication Sig Dispense Refill  . acyclovir (ZOVIRAX) 400 MG tablet TAKE 1 TABLET BY MOUTH TWICE A DAY 180 tablet 1  . anastrozole (ARIMIDEX) 1 MG tablet Take 1 tablet (1 mg total) by mouth daily. 90 tablet 4  . Ascorbic Acid (VITAMIN C) 1000 MG tablet Take 1,000 mg by mouth daily.    . carvedilol (COREG) 3.125 MG tablet Take 1 tablet (3.125 mg total) by mouth 2 (two) times daily with a meal. 180 tablet 2  . Cholecalciferol (VITAMIN D3 PO) Take by mouth daily.    . Cyanocobalamin (VITAMIN B12) 500 MCG TABS Take 500 mcg by mouth daily.     . ferrous sulfate 325 (65 FE) MG tablet Take 1 tablet (325 mg total) by mouth daily. 30 tablet 0  . lidocaine (XYLOCAINE) 2 % solution Use as directed 5 mLs in the mouth or throat as needed for mouth pain. Swish and spit 200 mL 1  . lidocaine-prilocaine (EMLA) cream Apply 1 application topically as needed. 30 g 6  . losartan (COZAAR) 50 MG tablet TAKE 1 TABLET BY MOUTH EVERY DAY 90 tablet 3  . Multiple Vitamin (MULITIVITAMIN WITH MINERALS) TABS Take 1 tablet by mouth daily.    . prochlorperazine (COMPAZINE) 10 MG tablet Take 1 tablet (10 mg total) by mouth every 6 (six) hours as needed (Nausea or vomiting). 60 tablet 1  . spironolactone-hydrochlorothiazide (ALDACTAZIDE) 25-25 MG tablet TAKE 1 TABLET BY MOUTH EVERY DAY 30 tablet 2  . venlafaxine XR (EFFEXOR-XR) 37.5 MG 24 hr capsule TAKE 1 CAPSULE BY MOUTH DAILY WITH BREAKFAST. 90 capsule 0  . zinc gluconate 50 MG tablet Take 50 mg by mouth daily.     No current facility-administered  medications for this visit.    OBJECTIVE:  African-American woman who appears stated age  43:   02/07/21 1301  BP: (!) 114/55  Pulse: 65  Resp: 18  Temp: (!) 97.3 F (36.3 C)  SpO2: 99%   Wt Readings from Last 3 Encounters:  02/07/21 182 lb 8 oz (82.8 kg)  12/27/20 180 lb 11.2 oz (82 kg)  11/29/20 181 lb 12 oz (82.4 kg)   Body mass index is 36.86 kg/m.    ECOG FS:1 - Symptomatic but completely ambulatory  Sclerae unicteric, EOMs intact Wearing a mask No cervical or supraclavicular adenopathy Lungs no rales or rhonchi Heart regular rate and rhythm Abd soft, nontender, positive bowel sounds MSK no focal spinal tenderness, no upper extremity lymphedema Neuro: nonfocal, well oriented, appropriate affect Breasts: Status post right mastectomy.  There is no evidence of chest wall recurrence.  Left breast is status post lumpectomy.  There is no evidence of chest wall recurrence.  Both axillae are benign.   LAB RESULTS:  CMP     Component Value Date/Time   NA 136 01/17/2021 1310   K 4.4 01/17/2021 1310   CL 111 01/17/2021 1310   CO2 18 (L) 01/17/2021 1310   GLUCOSE 102 (H) 01/17/2021 1310   BUN 43 (H) 01/17/2021 1310   CREATININE 1.78 (H) 01/17/2021 1310   CREATININE 1.56 (H) 07/31/2020 1400   CALCIUM 10.1 01/17/2021 1310   CALCIUM 11.2 (H) 02/24/2020 0830   PROT 7.6 01/17/2021 1310   ALBUMIN 3.1 (L) 01/17/2021 1310   AST 112 (H) 01/17/2021 1310   AST 134 (H) 07/31/2020 1400   ALT 88 (H) 01/17/2021 1310   ALT 81 (H) 07/31/2020 1400   ALKPHOS 135 (H) 01/17/2021 1310   BILITOT 0.6 01/17/2021 1310   BILITOT 0.9 07/31/2020 1400   GFRNONAA 35 (L) 01/17/2021 1310   GFRNONAA 39 (L) 07/31/2020 1400   GFRAA 41 (L) 08/30/2020 0808   GFRAA 45 (L) 07/31/2020 1400    Lab Results  Component Value Date   TOTALPROTELP 7.0 02/25/2019     Lab Results  Component Value Date   KPAFRELGTCHN 76.7 (H) 02/25/2019   LAMBDASER 27.2 (H) 02/25/2019   KAPLAMBRATIO 2.82 (H)  02/25/2019    Lab Results  Component Value Date   WBC 4.7 01/17/2021   NEUTROABS 2.8 01/17/2021   HGB 8.2 (L) 01/17/2021   HCT 25.2 (L) 01/17/2021   MCV 88.4 01/17/2021   PLT 81 (L) 01/17/2021   No results found for: LABCA2  No components found for: IWPYKD983  No results for input(s): INR in the last 168 hours.  No results found for: LABCA2  No results found for: JAS505  No results found for: LZJ673  No results found for: ALP379  Lab Results  Component Value Date   CA2729 38.7 (H) 02/25/2019    No components found for: HGQUANT  No results found for: CEA1 / No results found for: CEA1   No results found for: AFPTUMOR  No results found for: CHROMOGRNA  No results found for: HGBA, HGBA2QUANT, HGBFQUANT, HGBSQUAN (Hemoglobinopathy evaluation)   No results found for: LDH  No results found for: IRON, TIBC, IRONPCTSAT (Iron and TIBC)  Lab Results  Component Value Date   FERRITIN 98 12/27/2020    Urinalysis    Component Value Date/Time   COLORURINE YELLOW 08/18/2019 [place] 08/18/2019 1420   LABSPEC 1.016 08/18/2019 1420   PHURINE 5.0 08/18/2019 [place] 08/18/2019 [place] 08/18/2019 [place] 08/18/2019 1420   KETONESUR NEGATIVE 08/18/2019 1420   PROTEINUR NEGATIVE 08/18/2019 1420   UROBILINOGEN 0.2 11/29/2011 1656   NITRITE NEGATIVE 08/18/2019 1420   LEUKOCYTESUR SMALL (A) 08/18/2019 1420    STUDIES: No results found.   ELIGIBLE FOR AVAILABLE RESEARCH PROTOCOL: no   ASSESSMENT:  50 y.o. [place], [place] woman  (1) status post left lumpectomy in 2000 for a (?) T2N0 breast cancer,  (a) status post adjuvant chemotherapy  (b) status post adjuvant radiation  (c) did not receive antiestrogens  (2) genetics testing May 18, 2018 through the common Hereditary Cancers Panel + Myelodysplastic Syndrome/Leukemia Panel found no deleterious mutations in APC, ATM, AXIN2, BARD1, BLM, BMPR1A,  BRCA1, BRCA2, BRIP1, CDH1, CDK4, CDKN2A (p14ARF), CDKN2A (p16INK4a), CEBPA, CHEK2, CTNNA1, DICER1, EPCAM*, GATA2, GREM1*, HRAS, KIT, MEN1, MLH1, MSH2, MSH3, MSH6, MUTYH, NBN, NF1, PALB2, PDGFRA, PMS2, POLD1, POLE, PTEN, RAD50, RAD51C, RAD51D, RUNX1, SDHB, SDHC, SDHD, SMAD4, SMARCA4, STK11, TERC, TERT, TP53, TSC1, TSC2, VHL The following genes were evaluated for sequence changes only: HOXB13*, NTHL1*, SDHA  (a)  3 variants of uncertain significance were identified in the genes BARD1 c.764A>G (p.Asn255Ser), BRIP1 c.2563C>T (p.Arg855Cys), and PALB2 c.3103A>T (p.Ile1035Phe).   (3) status post right breast biopsy 03/16/2018 for a clinical mT2-3 N2, anatomic stage III invasive ductal carcinoma, grade 2, triple positive, with an MIB-1 of 80%.  (a) bone scan and chest CT scan negative for metastases except for a T6 lytic lesion of concern  (b) thoracic spine MRI was ordered May 2019 but never performed.  (4) neoadjuvant chemotherapy consisting of carboplatin, docetaxel, trastuzumab and Pertuzumab every 3 weeks x 6 starting 04/16/2018  (a) pertuzumab held beginning with cycle 2 because of diarrhea  (5) trastuzumab was to be continued Q4w indefinitely, discontinued after 10/21/2019 dose with progression  (a) echocardiogram on 06/29/2018 showed an ejection fraction in the 65-70% range  (b) echocardiogram 11/19/2018 showed an ejection fraction in the 60-65% range  (c) echo 04/07/2019 shows an ejection fraction in the 55-60% range  (d) echo 07/15/2019 shows EF of 60-65%--for additional echoes see under #12 below  (6) right mastectomy and sentinel lymph node sampling on 08/26/2018 found a residual 2.5cm invasive ductal carcinoma, with 2 of 5 sentinel lymph nodes positive, ypT2, ypN1a; margins were clear  (a) repeat prognostic panel confirms tumor still triple positive (HER2 3+)  (7) adjuvant radiation 09/30/2018 - 11/16/2018 Site/dose:   The patient initially received a dose of 50.4 Gy in 28 fractions to the  right chest wall and supraclavicular region. This was delivered using a 3-D conformal, 4 field technique. The patient then received a boost to the mastectomy scar. This delivered an additional 10 Gy in 5 fractions using an en face electron field. The total dose was 60.4   (8) anastrozole started 12/03/2018  (a) [place] on 10/01/2018 was 64.3, and estradiol 7.0, consistent with menopause  (b) referral to pelvic floor rehabilitation 12/03/2018  (c) [place] on 03/16/2020 was 80.8 with estradiol less than 2.5   METASTATIC DISEASE: March 2020 (9) CT of neck and CT angiography of the chest 02/04/2019 finds the T6 lytic lesion noted May 2019 (see #3 above) is now sclerotic; a sclerotic T3 lesion is stable; there is a new lytic lesion in the manubrium  (a) PET scan 03/12/2019 shows manubrium, T3 and T5 metastases, no visceral disease  (b) biopsy of manubrial lesion planned but not done secondary to pandemic  (c) Bone scan 09/01/19 shows ? progression of disease in spine; MRI 09/02/19 shows early metastases in T10 (new), T3 and T5 stable; CT chest 09/01/19 shows 57mm pulmonary nodules that are new, but nonspecific and warrant future follow up  (d) PET 10/26/2019 shows no lung or liver lesions; new mediastinal adenopathy, bone lesions-- T-DM1 started  (e) PET scan 03/08/2020 shows resolution of the mediastinal adenopathy and  hypermetabolic bone lesions, questionable subcutaneous nodule along the left lateral shoulder  (f) for additional staging studies see under "12" below  (10) SRS to spine, palliative treatment to sternum, from 05/13/2019 through 05/26/2019:  1. Chest_sternum // 40 Gy in 10 fractions  2. Thoracic Spine (T3) // 18 Gy in 1 fraction   3. Thoracic Spine (T5) // 18 Gy in 1 fraction  (11) denosumab/Xgeva started 09/08/2019, currently given every 6 weeks  (12) T-DM1 started 01/15/20201  (a) echo 10/26/2019 shows stable EF at 55-60%  (b) echo 03/08/2020 shows an ejection fraction in the 70-75%  range  (c) echo 07/12/2020 shows an ejection fraction in the 65 to 70% range.  (d) PET scan 03/08/2020 shows no active disease; a 0.9 cm subcutaneous nodule in the left shoulder area seen on the PET was not identifiable by exam 03/16/2020  (e) PET 08/24/2020 shows stable disease except for a new 0.6 cm lymph node with an SUV of 10.1  (f) bronchoscopy 09/05/2020 with endobronchial ultrasound found no detectable mass in the region noted on the September PET scan  (g) T-DM1 discontinued and trastuzumab resumed 11/29/2020   PLAN: [name] is doing remarkably well, with no symptoms at present related to her cancer and excellent tolerance of her treatment.  She has a restaging PET scan coming up and I think she will get good news there.  I am going to go ahead and also obtain an echocardiography and from that point we will go back to every 6 months.  At some point this year we will obtain a head CT given the frequency of brain metastases and HER-2 positive patients but I would like to make sure first that her peripheral disease is well controlled  I will see her briefly when she returns in 3 weeks for her next treatment just to review results of her staging studies  Total encounter time 25 minutes.Sarajane Jews C. Magrinat, MD 02/07/21 1:12 PM Medical Oncology and Hematology [facility] [place], [place] [address] Tel. [phone]    Fax. [phone]   I, Wilburn Mylar, am acting as scribe for Dr. Virgie Dad. Magrinat.  I, Lurline Del MD, have reviewed the above documentation for accuracy and completeness, and I agree with the above.   *Total Encounter Time as defined by the Centers for Medicare and Medicaid Services includes, in addition to the face-to-face time of a patient visit (documented in the note above) non-face-to-face time: obtaining and reviewing outside history, ordering and reviewing medications, tests or procedures, care coordination  (communications with other health care professionals or caregivers) and documentation in the medical record.

## 2021-02-09 ENCOUNTER — Encounter: Payer: Self-pay | Admitting: Oncology

## 2021-02-09 NOTE — Progress Notes (Signed)
Sent staff message to Mickel Baas in pharmacy regarding potential patient assistance for Retacrit OOP cost. There is no copay assistance available.

## 2021-02-13 ENCOUNTER — Ambulatory Visit (HOSPITAL_COMMUNITY)
Admission: RE | Admit: 2021-02-13 | Discharge: 2021-02-13 | Disposition: A | Discharge status: Home / Self Care | Payer: BC Managed Care – PPO | Source: Ambulatory Visit | Attending: Oncology | Admitting: Oncology

## 2021-02-13 ENCOUNTER — Other Ambulatory Visit: Payer: Self-pay

## 2021-02-13 DIAGNOSIS — Z0189 Encounter for other specified special examinations: Secondary | ICD-10-CM | POA: Diagnosis not present

## 2021-02-13 DIAGNOSIS — Z170 Estrogen receptor positive status [ER+]: Secondary | ICD-10-CM | POA: Diagnosis not present

## 2021-02-13 DIAGNOSIS — Z6841 Body Mass Index (BMI) 40.0 and over, adult: Secondary | ICD-10-CM | POA: Diagnosis not present

## 2021-02-13 DIAGNOSIS — I129 Hypertensive chronic kidney disease with stage 1 through stage 4 chronic kidney disease, or unspecified chronic kidney disease: Secondary | ICD-10-CM | POA: Insufficient documentation

## 2021-02-13 DIAGNOSIS — C50411 Malignant neoplasm of upper-outer quadrant of right female breast: Secondary | ICD-10-CM | POA: Insufficient documentation

## 2021-02-13 DIAGNOSIS — N189 Chronic kidney disease, unspecified: Secondary | ICD-10-CM | POA: Diagnosis not present

## 2021-02-13 DIAGNOSIS — C7951 Secondary malignant neoplasm of bone: Secondary | ICD-10-CM

## 2021-02-13 DIAGNOSIS — Z7189 Other specified counseling: Secondary | ICD-10-CM

## 2021-02-13 DIAGNOSIS — D649 Anemia, unspecified: Secondary | ICD-10-CM | POA: Insufficient documentation

## 2021-02-13 NOTE — Progress Notes (Signed)
  Echocardiogram 2D Echocardiogram with 3D has been performed.  [name] [name] M 02/13/2021, 9:48 AM

## 2021-02-14 ENCOUNTER — Other Ambulatory Visit: Payer: Self-pay | Admitting: Oncology

## 2021-02-14 DIAGNOSIS — Z170 Estrogen receptor positive status [ER+]: Secondary | ICD-10-CM

## 2021-02-14 DIAGNOSIS — C50411 Malignant neoplasm of upper-outer quadrant of right female breast: Secondary | ICD-10-CM

## 2021-02-14 LAB — ECHOCARDIOGRAM COMPLETE
Area-P 1/2: 2.79 cm2
S' Lateral: 3.1 cm

## 2021-02-15 ENCOUNTER — Ambulatory Visit (HOSPITAL_COMMUNITY): Admission: RE | Admit: 2021-02-15 | Payer: BC Managed Care – PPO | Source: Ambulatory Visit

## 2021-02-21 ENCOUNTER — Ambulatory Visit: Payer: BC Managed Care – PPO

## 2021-02-21 ENCOUNTER — Other Ambulatory Visit: Payer: BC Managed Care – PPO

## 2021-02-26 ENCOUNTER — Ambulatory Visit (HOSPITAL_COMMUNITY)
Admission: RE | Admit: 2021-02-26 | Discharge: 2021-02-26 | Disposition: A | Discharge status: Home / Self Care | Payer: BC Managed Care – PPO | Source: Ambulatory Visit | Attending: Oncology | Admitting: Oncology

## 2021-02-26 ENCOUNTER — Other Ambulatory Visit: Payer: Self-pay

## 2021-02-26 DIAGNOSIS — C50911 Malignant neoplasm of unspecified site of right female breast: Secondary | ICD-10-CM | POA: Diagnosis not present

## 2021-02-26 DIAGNOSIS — C50411 Malignant neoplasm of upper-outer quadrant of right female breast: Secondary | ICD-10-CM | POA: Insufficient documentation

## 2021-02-26 DIAGNOSIS — Z170 Estrogen receptor positive status [ER+]: Secondary | ICD-10-CM | POA: Diagnosis not present

## 2021-02-26 LAB — GLUCOSE, CAPILLARY: Glucose-Capillary: 99 mg/dL (ref 70–99)

## 2021-02-26 MED ORDER — FLUDEOXYGLUCOSE F - 18 (FDG) INJECTION
9.0200 | Freq: Once | INTRAVENOUS | Status: AC | PRN
  Administered 2021-02-26: 9.02 via INTRAVENOUS

## 2021-02-28 ENCOUNTER — Telehealth: Payer: Self-pay

## 2021-02-28 ENCOUNTER — Other Ambulatory Visit: Payer: Self-pay | Admitting: Oncology

## 2021-02-28 ENCOUNTER — Other Ambulatory Visit: Payer: BC Managed Care – PPO

## 2021-02-28 ENCOUNTER — Ambulatory Visit: Payer: BC Managed Care – PPO

## 2021-02-28 ENCOUNTER — Ambulatory Visit: Payer: BC Managed Care – PPO | Admitting: Oncology

## 2021-02-28 MED ORDER — TRAMADOL HCL 50 MG PO TABS: 50.0000 mg | ORAL_TABLET | Freq: Four times a day (QID) | ORAL | 0 refills | Status: DC | PRN

## 2021-02-28 NOTE — Progress Notes (Signed)
Bricelyn's PET scan shows some evidence of disease progression.  We are going to go back to T-DM1 when she returns to see me 03/06/2021.  I called and left her a voicemail saying that.

## 2021-02-28 NOTE — Progress Notes (Signed)
..  The following Medication: Retacrit has been approved thru [employer] as Risk manager. Enrollment period is 02/28/2021 to 12/01/2021.  Assistance ID: [id number]. Reason for Assistance: EOOP First DOS: 03/07/2021.

## 2021-02-28 NOTE — Telephone Encounter (Signed)
Pt called stating she is in 10/10 pain in "right rib area" and is requesting pain medication. Pt also voices concerns about PET scan results. [name] is calling in Tramadol for pt and is reviewing PET results. Pt verbalized thanks and understanding.

## 2021-03-02 ENCOUNTER — Telehealth: Payer: Self-pay

## 2021-03-02 NOTE — Telephone Encounter (Signed)
Attempted to call pt regarding appts needed for retacrit injection. Pt did not answer. LVM to return call to [phone].

## 2021-03-05 ENCOUNTER — Other Ambulatory Visit: Payer: Self-pay | Admitting: Oncology

## 2021-03-05 ENCOUNTER — Ambulatory Visit (HOSPITAL_COMMUNITY): Payer: BC Managed Care – PPO

## 2021-03-06 ENCOUNTER — Telehealth: Payer: Self-pay | Admitting: Oncology

## 2021-03-06 NOTE — Telephone Encounter (Signed)
Moved upcoming appointment per 4/5 schedule message. Patient is aware of changes.

## 2021-03-07 ENCOUNTER — Inpatient Hospital Stay: Payer: BC Managed Care – PPO

## 2021-03-07 ENCOUNTER — Other Ambulatory Visit: Payer: Self-pay

## 2021-03-07 ENCOUNTER — Inpatient Hospital Stay: Payer: BC Managed Care – PPO | Attending: Oncology

## 2021-03-07 ENCOUNTER — Inpatient Hospital Stay (HOSPITAL_BASED_OUTPATIENT_CLINIC_OR_DEPARTMENT_OTHER): Payer: BC Managed Care – PPO | Admitting: Oncology

## 2021-03-07 ENCOUNTER — Ambulatory Visit: Payer: BC Managed Care – PPO | Admitting: Oncology

## 2021-03-07 ENCOUNTER — Other Ambulatory Visit: Payer: BC Managed Care – PPO

## 2021-03-07 VITALS — BP 132/79 | HR 71 | Temp 98.6°F | Resp 18 | Wt 184.0 lb

## 2021-03-07 DIAGNOSIS — Z170 Estrogen receptor positive status [ER+]: Secondary | ICD-10-CM

## 2021-03-07 DIAGNOSIS — D631 Anemia in chronic kidney disease: Secondary | ICD-10-CM | POA: Insufficient documentation

## 2021-03-07 DIAGNOSIS — Z7189 Other specified counseling: Secondary | ICD-10-CM

## 2021-03-07 DIAGNOSIS — C50411 Malignant neoplasm of upper-outer quadrant of right female breast: Secondary | ICD-10-CM

## 2021-03-07 DIAGNOSIS — N184 Chronic kidney disease, stage 4 (severe): Secondary | ICD-10-CM

## 2021-03-07 DIAGNOSIS — Z79899 Other long term (current) drug therapy: Secondary | ICD-10-CM | POA: Diagnosis not present

## 2021-03-07 DIAGNOSIS — C7951 Secondary malignant neoplasm of bone: Secondary | ICD-10-CM | POA: Diagnosis not present

## 2021-03-07 DIAGNOSIS — Z95828 Presence of other vascular implants and grafts: Secondary | ICD-10-CM

## 2021-03-07 DIAGNOSIS — Z5112 Encounter for antineoplastic immunotherapy: Secondary | ICD-10-CM | POA: Diagnosis not present

## 2021-03-07 LAB — CBC WITH DIFFERENTIAL/PLATELET
Abs Immature Granulocytes: 0.02 10*3/uL (ref 0.00–0.07)
Basophils Absolute: 0 10*3/uL (ref 0.0–0.1)
Basophils Relative: 0 %
Eosinophils Absolute: 0.1 10*3/uL (ref 0.0–0.5)
Eosinophils Relative: 2 %
HCT: 25.7 % — ABNORMAL LOW (ref 36.0–46.0)
Hemoglobin: 8.3 g/dL — ABNORMAL LOW (ref 12.0–15.0)
Immature Granulocytes: 0 %
Lymphocytes Relative: 15 %
Lymphs Abs: 0.8 10*3/uL (ref 0.7–4.0)
MCH: 29 pg (ref 26.0–34.0)
MCHC: 32.3 g/dL (ref 30.0–36.0)
MCV: 89.9 fL (ref 80.0–100.0)
Monocytes Absolute: 0.7 10*3/uL (ref 0.1–1.0)
Monocytes Relative: 14 %
Neutro Abs: 3.5 10*3/uL (ref 1.7–7.7)
Neutrophils Relative %: 69 %
Platelets: 80 10*3/uL — ABNORMAL LOW (ref 150–400)
RBC: 2.86 MIL/uL — ABNORMAL LOW (ref 3.87–5.11)
RDW: 16.4 % — ABNORMAL HIGH (ref 11.5–15.5)
WBC: 5.2 10*3/uL (ref 4.0–10.5)
nRBC: 0 % (ref 0.0–0.2)

## 2021-03-07 LAB — COMPREHENSIVE METABOLIC PANEL
ALT: 89 U/L — ABNORMAL HIGH (ref 0–44)
AST: 109 U/L — ABNORMAL HIGH (ref 15–41)
Albumin: 3 g/dL — ABNORMAL LOW (ref 3.5–5.0)
Alkaline Phosphatase: 147 U/L — ABNORMAL HIGH (ref 38–126)
Anion gap: 7 (ref 5–15)
BUN: 40 mg/dL — ABNORMAL HIGH (ref 6–20)
CO2: 18 mmol/L — ABNORMAL LOW (ref 22–32)
Calcium: 10.6 mg/dL — ABNORMAL HIGH (ref 8.9–10.3)
Chloride: 112 mmol/L — ABNORMAL HIGH (ref 98–111)
Creatinine, Ser: 1.67 mg/dL — ABNORMAL HIGH (ref 0.44–1.00)
GFR, Estimated: 37 mL/min — ABNORMAL LOW (ref >60)
Glucose, Bld: 97 mg/dL (ref 70–99)
Potassium: 4.5 mmol/L (ref 3.5–5.1)
Sodium: 137 mmol/L (ref 135–145)
Total Bilirubin: 0.5 mg/dL (ref 0.3–1.2)
Total Protein: 7.2 g/dL (ref 6.5–8.1)

## 2021-03-07 MED ORDER — DIPHENHYDRAMINE HCL 25 MG PO CAPS
ORAL_CAPSULE | ORAL | Status: AC
  Filled 2021-03-07: qty 1

## 2021-03-07 MED ORDER — ACETAMINOPHEN 325 MG PO TABS
650.0000 mg | ORAL_TABLET | Freq: Once | ORAL | Status: AC
  Administered 2021-03-07: 650 mg via ORAL

## 2021-03-07 MED ORDER — SODIUM CHLORIDE 0.9 % IV SOLN
Freq: Once | INTRAVENOUS | Status: AC
  Filled 2021-03-07: qty 250

## 2021-03-07 MED ORDER — EPOETIN ALFA-EPBX 10000 UNIT/ML IJ SOLN
20000.0000 [IU] | Freq: Once | INTRAMUSCULAR | Status: AC
  Administered 2021-03-07: 20000 [IU] via SUBCUTANEOUS

## 2021-03-07 MED ORDER — HEPARIN SOD (PORK) LOCK FLUSH 100 UNIT/ML IV SOLN
500.0000 [IU] | Freq: Once | INTRAVENOUS | Status: AC | PRN
  Administered 2021-03-07: 500 [IU]
  Filled 2021-03-07: qty 5

## 2021-03-07 MED ORDER — EPOETIN ALFA-EPBX 10000 UNIT/ML IJ SOLN
INTRAMUSCULAR | Status: AC
  Filled 2021-03-07: qty 2

## 2021-03-07 MED ORDER — SODIUM CHLORIDE 0.9% FLUSH
10.0000 mL | INTRAVENOUS | Status: DC | PRN
  Administered 2021-03-07: 10 mL
  Filled 2021-03-07: qty 10

## 2021-03-07 MED ORDER — DIPHENHYDRAMINE HCL 25 MG PO CAPS
25.0000 mg | ORAL_CAPSULE | Freq: Once | ORAL | Status: AC
  Administered 2021-03-07: 25 mg via ORAL

## 2021-03-07 MED ORDER — SODIUM CHLORIDE 0.9 % IV SOLN
3.6000 mg/kg | Freq: Once | INTRAVENOUS | Status: AC
  Administered 2021-03-07: 300 mg via INTRAVENOUS
  Filled 2021-03-07: qty 15

## 2021-03-07 MED ORDER — ACETAMINOPHEN 325 MG PO TABS
ORAL_TABLET | ORAL | Status: AC
  Filled 2021-03-07: qty 2

## 2021-03-07 NOTE — Patient Instructions (Signed)
[facility] Discharge Instructions for Patients Receiving Chemotherapy  Today you received the following chemotherapy agents Trastuzumab  To help prevent nausea and vomiting after your treatment, we encourage you to take your nausea medication as directed.    If you develop nausea and vomiting that is not controlled by your nausea medication, call the clinic.   BELOW ARE SYMPTOMS THAT SHOULD BE REPORTED IMMEDIATELY:  *FEVER GREATER THAN 100.5 F  *CHILLS WITH OR WITHOUT FEVER  NAUSEA AND VOMITING THAT IS NOT CONTROLLED WITH YOUR NAUSEA MEDICATION  *UNUSUAL SHORTNESS OF BREATH  *UNUSUAL BRUISING OR BLEEDING  TENDERNESS IN MOUTH AND THROAT WITH OR WITHOUT PRESENCE OF ULCERS  *URINARY PROBLEMS  *BOWEL PROBLEMS  UNUSUAL RASH Items with * indicate a potential emergency and should be followed up as soon as possible.  Feel free to call the clinic should you have any questions or concerns. The clinic phone number is [phone].  Please show the [facility] at check-in to the Emergency Department and triage nurse.  Denosumab injection What is this medicine? DENOSUMAB (den oh sue mab) slows bone breakdown. Prolia is used to treat osteoporosis in women after menopause and in men, and in people who are taking corticosteroids for 6 months or more. Delton See is used to treat a high calcium level due to cancer and to prevent bone fractures and other bone problems caused by multiple myeloma or cancer bone metastases. Delton See is also used to treat giant cell tumor of the bone. This medicine may be used for other purposes; ask your health care provider or pharmacist if you have questions. COMMON BRAND NAME(S): Prolia, XGEVA What should I tell my health care provider before I take this medicine? They need to know if you have any of these conditions:  dental disease  having surgery or tooth extraction  infection  kidney disease  low levels of calcium or Vitamin D in the  blood  malnutrition  on hemodialysis  skin conditions or sensitivity  thyroid or parathyroid disease  an unusual reaction to denosumab, other medicines, foods, dyes, or preservatives  pregnant or trying to get pregnant  breast-feeding How should I use this medicine? This medicine is for injection under the skin. It is given by a health care professional in a hospital or clinic setting. A special MedGuide will be given to you before each treatment. Be sure to read this information carefully each time. For Prolia, talk to your pediatrician regarding the use of this medicine in children. Special care may be needed. For Delton See, talk to your pediatrician regarding the use of this medicine in children. While this drug may be prescribed for children as young as 13 years for selected conditions, precautions do apply. Overdosage: If you think you have taken too much of this medicine contact a poison control center or emergency room at once. NOTE: This medicine is only for you. Do not share this medicine with others. What if I miss a dose? It is important not to miss your dose. Call your doctor or health care professional if you are unable to keep an appointment. What may interact with this medicine? Do not take this medicine with any of the following medications:  other medicines containing denosumab This medicine may also interact with the following medications:  medicines that lower your chance of fighting infection  steroid medicines like prednisone or cortisone This list may not describe all possible interactions. Give your health care provider a list of all the medicines, herbs,  non-prescription drugs, or dietary supplements you use. Also tell them if you smoke, drink alcohol, or use illegal drugs. Some items may interact with your medicine. What should I watch for while using this medicine? Visit your doctor or health care professional for regular checks on your progress. Your doctor or  health care professional may order blood tests and other tests to see how you are doing. Call your doctor or health care professional for advice if you get a fever, chills or sore throat, or other symptoms of a cold or flu. Do not treat yourself. This drug may decrease your body's ability to fight infection. Try to avoid being around people who are sick. You should make sure you get enough calcium and vitamin D while you are taking this medicine, unless your doctor tells you not to. Discuss the foods you eat and the vitamins you take with your health care professional. See your dentist regularly. Brush and floss your teeth as directed. Before you have any dental work done, tell your dentist you are receiving this medicine. Do not become pregnant while taking this medicine or for 5 months after stopping it. Talk with your doctor or health care professional about your birth control options while taking this medicine. Women should inform their doctor if they wish to become pregnant or think they might be pregnant. There is a potential for serious side effects to an unborn child. Talk to your health care professional or pharmacist for more information. What side effects may I notice from receiving this medicine? Side effects that you should report to your doctor or health care professional as soon as possible:  allergic reactions like skin rash, itching or hives, swelling of the face, lips, or tongue  bone pain  breathing problems  dizziness  jaw pain, especially after dental work  redness, blistering, peeling of the skin  signs and symptoms of infection like fever or chills; cough; sore throat; pain or trouble passing urine  signs of low calcium like fast heartbeat, muscle cramps or muscle pain; pain, tingling, numbness in the hands or feet; seizures  unusual bleeding or bruising  unusually weak or tired Side effects that usually do not require medical attention (report to your doctor or  health care professional if they continue or are bothersome):  constipation  diarrhea  headache  joint pain  loss of appetite  muscle pain  runny nose  tiredness  upset stomach This list may not describe all possible side effects. Call your doctor for medical advice about side effects. You may report side effects to FDA at 1-800-FDA-1088. Where should I keep my medicine? This medicine is only given in a clinic, doctor's office, or other health care setting and will not be stored at home. NOTE: This sheet is a summary. It may not cover all possible information. If you have questions about this medicine, talk to your doctor, pharmacist, or health care provider.  2021 [employer]/Gold Standard (2018-03-27 16:10:44)  Epoetin Alfa injection What is this medicine? EPOETIN ALFA (e POE e tin AL fa) helps your body make more red blood cells. This medicine is used to treat anemia caused by chronic kidney disease, cancer chemotherapy, or HIV-therapy. It may also be used before surgery if you have anemia. This medicine may be used for other purposes; ask your health care provider or pharmacist if you have questions. COMMON BRAND NAME(S): Epogen, Procrit, Retacrit What should I tell my health care provider before I take this medicine? They need to know  if you have any of these conditions:  cancer  heart disease  high blood pressure  history of blood clots  history of stroke  low levels of folate, iron, or vitamin B12 in the blood  seizures  an unusual or allergic reaction to erythropoietin, albumin, benzyl alcohol, hamster proteins, other medicines, foods, dyes, or preservatives  pregnant or trying to get pregnant  breast-feeding How should I use this medicine? This medicine is for injection into a vein or under the skin. It is usually given by a health care professional in a hospital or clinic setting. If you get this medicine at home, you will be taught how to prepare and  give this medicine. Use exactly as directed. Take your medicine at regular intervals. Do not take your medicine more often than directed. It is important that you put your used needles and syringes in a special sharps container. Do not put them in a trash can. If you do not have a sharps container, call your pharmacist or healthcare provider to get one. A special MedGuide will be given to you by the pharmacist with each prescription and refill. Be sure to read this information carefully each time. Talk to your pediatrician regarding the use of this medicine in children. While this drug may be prescribed for selected conditions, precautions do apply. Overdosage: If you think you have taken too much of this medicine contact a poison control center or emergency room at once. NOTE: This medicine is only for you. Do not share this medicine with others. What if I miss a dose? If you miss a dose, take it as soon as you can. If it is almost time for your next dose, take only that dose. Do not take double or extra doses. What may interact with this medicine? Interactions have not been studied. This list may not describe all possible interactions. Give your health care provider a list of all the medicines, herbs, non-prescription drugs, or dietary supplements you use. Also tell them if you smoke, drink alcohol, or use illegal drugs. Some items may interact with your medicine. What should I watch for while using this medicine? Your condition will be monitored carefully while you are receiving this medicine. You may need blood work done while you are taking this medicine. This medicine may cause a decrease in vitamin B6. You should make sure that you get enough vitamin B6 while you are taking this medicine. Discuss the foods you eat and the vitamins you take with your health care professional. What side effects may I notice from receiving this medicine? Side effects that you should report to your doctor or  health care professional as soon as possible:  allergic reactions like skin rash, itching or hives, swelling of the face, lips, or tongue  seizures  signs and symptoms of a blood clot such as breathing problems; changes in vision; chest pain; severe, sudden headache; pain, swelling, warmth in the leg; trouble speaking; sudden numbness or weakness of the face, arm or leg  signs and symptoms of a stroke like changes in vision; confusion; trouble speaking or understanding; severe headaches; sudden numbness or weakness of the face, arm or leg; trouble walking; dizziness; loss of balance or coordination Side effects that usually do not require medical attention (report to your doctor or health care professional if they continue or are bothersome):  chills  cough  dizziness  fever  headaches  joint pain  muscle cramps  muscle pain  nausea, vomiting  pain, redness,  or irritation at site where injected This list may not describe all possible side effects. Call your doctor for medical advice about side effects. You may report side effects to FDA at 1-800-FDA-1088. Where should I keep my medicine? Keep out of the reach of children. Store in a refrigerator between 2 and 8 degrees C (36 and 46 degrees F). Do not freeze or shake. Throw away any unused portion if using a single-dose vial. Multi-dose vials can be kept in the refrigerator for up to 21 days after the initial dose. Throw away unused medicine. NOTE: This sheet is a summary. It may not cover all possible information. If you have questions about this medicine, talk to your doctor, pharmacist, or health care provider.  2021 [employer]/Gold Standard (2017-06-27 08:35:19)

## 2021-03-07 NOTE — Patient Instructions (Signed)

## 2021-03-07 NOTE — Progress Notes (Signed)
[facility]  [phone] [phone]    ID: [name] [name] DOB: 1971-08-19  MR#: [id number]  EVO#:[id number]  Patient Care Team: Marda Stalker, PA-C as PCP - General (Family Medicine) Jerline Pain, MD as PCP - Cardiology (Cardiology) Sumire Halbleib, Virgie Dad, MD as Consulting Physician (Oncology) Loreta Ave, MD as Referring Physician (Internal Medicine) Donato Heinz, MD as Consulting Physician (Nephrology) Kyung Rudd, MD as Consulting Physician (Radiation Oncology) Jovita Kussmaul, MD as Consulting Physician (General Surgery) Larey Dresser, MD as Consulting Physician (Cardiology) Mauro Kaufmann, RN as Oncology Nurse Navigator Rockwell Germany, RN as Oncology Nurse Navigator OTHER MD:   CHIEF COMPLAINT: Triple positive breast cancer (s/p right mastectomy)  CURRENT TREATMENT: trastuzumab; anastrozole; Xgeva (Q 6 w); retacrit   INTERVAL HISTORY: [name] returns today for follow-up and treatment of her triple positive breast cancer.   Since her last visit, she underwent restaging PET scan on 02/26/2021 showing: new hypermetabolic prevascular lymph node; interval increase in metabolic activity of right hilar nodule; recurrence of metabolic activity within sclerotic lesion in right ischium adjacent to acetabulum.  In light of progression, we are going back to T-DM1 today.  She will receive that every 3 weeks.  She underwent repeat echocardiogram on 02/13/2021 showing an ejection fraction of 60-65%. This is stable from prior in 11/2020.  She continues on anastrozole.  She has no unusual side effects from this other than mild vaginal dryness which also of course is due to menopause  She last received denosumab/Xgeva on 02/07/2021.  We are doing this every 6 weeks.  She has no side effects from this that she is aware of.  Her next dose will be 03/21/2021.  REVIEW OF SYSTEMS: [name] tells me her daughter was accepted to the for universities, all in  [place], where she applied, including [employer].  She is going to go to [employer] state which is where she got the biggest scholarship.  They are rightly proud of her.  Can you herself is doing well, with no unusual headaches, visual changes, nausea, vomiting, cough, phlegm production, pleurisy, shortness of breath, or change in bowel or bladder habits.  A detailed review of systems today was otherwise stable   COVID 19 VACCINATION STATUS: fully vaccinated [employer]), most recently April 2021.   HISTORY OF CURRENT ILLNESS: From the original intake note:  [name] [name] palpated a lump on 01/15/2018. She felt soreness and shooting pain under her arm and in the right breast. She underwent bilateral diagnostic mammography with tomography and right breast ultrasonography at [facility] on 03/12/2018 showing: breast density category C. The is architectural distortion at the 11 o'clock position. There is also an oval mass in the right breast anterior depth. Examination of the right axilla showed enlarged lymph nodes. Ultrasonography showed a 4.6 cm mass in the right breast upper outer quadrant posterior depth. An additional 1.2 cm oval mass in the right breast 12 o'clock middle depth. The lymph nodes in the right axillary are highly suggestive of malignancy.   Accordingly on 03/16/2018 she proceeded to biopsy of the right breast area in question. The pathology from this procedure showed ([id number]): At both the 11 and 12 o'clock positions: Invasive ductal carcinoma grade II. Ductal carcinoma in situ, high grade, with necrosis and calcifications. Prognostic indicators significant for: estrogen receptor, 90% positive and progesterone receptor, 40% positive, both with strong staining intensity. Proliferation marker Ki67 at 80%. HER2 amplified with ratios HER2/CEP17 SIGNALS 6.90 and average HER2 copies per  cell 14.50  The patient's subsequent history is as detailed below.   PAST MEDICAL HISTORY: Past  Medical History:  Diagnosis Date  . Anxiety   . Breast cancer, left breast ([place]) 2000   Underwent lumpectomy, chemotherapy and radiation  . Facial paralysis/Bells palsy    left side  . Family history of lung cancer   . Family history of lymphoma   . Hypertension   . Neuropathy    IN FINGERS AND TOES   RECENT INFUSION OF CHEMO  . Personal history of breast cancer 05/06/2018  . Renal disorder    just after TIA acute kidney injury  . TIA (transient ischemic attack) 03/16/2017    hospital  The patient has a previous history of left breast cancer diagnosed in 2000. She was seen by [facility] in [place], [place] under Dr. Loreta Ave. According to the patient, her left breast cancer was stage II with no lymph node involvement (likely T2N0). She had chemotherapy every 3 weeks for about 6 months. She did not takes any "red drugs." She also did not take anti-estrogens (likely ER/PR negative).   TIA in 2018. She saw a neurologist as a precaution. She denies any clotting issues.    PAST SURGICAL HISTORY: Past Surgical History:  Procedure Laterality Date  . BREAST LUMPECTOMY WITH AXILLARY LYMPH NODE BIOPSY Left 2000   biopsy  . BREAST SURGERY     partial mastectomy with lymph node removal  . ENDOBRONCHIAL ULTRASOUND Bilateral 09/05/2020   Procedure: ENDOBRONCHIAL ULTRASOUND;  Surgeon: Collene Gobble, MD;  Location: [facility];  Service: Cardiopulmonary;  Laterality: Bilateral;  . MASTECTOMY Right 2019   & partial mastectomy left breast 2000  . MASTECTOMY MODIFIED RADICAL Right 08/26/2018   Procedure: MASTECTOMY MODIFIED RADICAL;  Surgeon: Jovita Kussmaul, MD;  Location: [facility];  Service: General;  Laterality: Right;  . MODIFIED MASTECTOMY Right 08/26/2018  . PORTACATH PLACEMENT Left 04/08/2018   Procedure: INSERTION PORT-A-CATH;  Surgeon: Jovita Kussmaul, MD;  Location: [facility];  Service: General;  Laterality: Left;  . TUBAL LIGATION Bilateral 2004  . VIDEO BRONCHOSCOPY N/A  09/05/2020   Procedure: VIDEO BRONCHOSCOPY WITHOUT FLUORO;  Surgeon: Collene Gobble, MD;  Location: [facility];  Service: Cardiopulmonary;  Laterality: N/A;  . WISDOM TOOTH EXTRACTION      FAMILY HISTORY Family History  Problem Relation Age of Onset  . Hypertension Mother   . Hypertension Father   . Diverticulosis Father   . Diabetes Father   . Lung cancer Father 61       hx smoking  . Hypertension Maternal Grandmother   . CAD Maternal Grandmother   . Alzheimer's disease Maternal Grandmother        died at 78  . Hypertension Paternal Grandmother   . CAD Paternal Grandmother   . Stroke Paternal Grandfather   . Sickle cell trait Paternal Grandfather   . Pneumonia Maternal Aunt        died at a young adult  . Lymphoma Paternal Aunt 42  The patient's father is alive at age 64 and has a history of lung cancer. The patient's mother is alive at age 25. The patient has 1 brother and no sisters. She denies a family history of breast or ovarian cancer.     GYNECOLOGIC HISTORY:  Patient's last menstrual period was 12/21/2017. Menarche: 50 years old Age at first live birth: 50 years old She is GXP3.  Her LMP was  January 2019.  The patient used oral  contraceptive from [phone] and the Depo-provera shot from [phone] with no complications. She never used HRT.  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  She also worked at [employer] as a Associate Professor.  She is now disabled.  Her husband, Ladene Artist, is a Naval architect. The patient's son, Jacquenette Shone age 27, works at [employer] in [place]. The patient's daughters, Lequita Halt age 60, and Eilene Ghazi age 62, are students. The patient's adopted son, Ivin Booty age 2, is also a Consulting civil engineer.    ADVANCED DIRECTIVES: In the absence of any documents to the contrary the patient's husband is her healthcare power of attorney   HEALTH MAINTENANCE: Social History    Tobacco Use  . Smoking status: Never Smoker  . Smokeless tobacco: Never Used  Vaping Use  . Vaping Use: Never used  Substance Use Topics  . Alcohol use: No  . Drug use: No     Colonoscopy: n/a  PAP: 2015  Bone density: never done   Allergies  Allergen Reactions  . Ace Inhibitors Swelling    Swelling of lips and face  . Lisinopril Swelling    Swelling of lips, face  . Amlodipine Swelling    Leg swelling  . Shellfish Allergy Swelling  . Adhesive [Tape] Itching and Rash    Please use paper tape  . Latex Itching and Rash    Current Outpatient Medications  Medication Sig Dispense Refill  . acyclovir (ZOVIRAX) 400 MG tablet TAKE 1 TABLET BY MOUTH TWICE A DAY 180 tablet 1  . anastrozole (ARIMIDEX) 1 MG tablet Take 1 tablet (1 mg total) by mouth daily. 90 tablet 4  . Ascorbic Acid (VITAMIN C) 1000 MG tablet Take 1,000 mg by mouth daily.    . carvedilol (COREG) 3.125 MG tablet TAKE 1 TABLET (3.125 MG TOTAL) BY MOUTH 2 (TWO) TIMES DAILY WITH A MEAL. 180 tablet 2  . Cholecalciferol (VITAMIN D3 PO) Take by mouth daily.    . Cyanocobalamin (VITAMIN B12) 500 MCG TABS Take 500 mcg by mouth daily.     . ferrous sulfate 325 (65 FE) MG tablet Take 1 tablet (325 mg total) by mouth daily. 30 tablet 0  . lidocaine (XYLOCAINE) 2 % solution Use as directed 5 mLs in the mouth or throat as needed for mouth pain. Swish and spit 200 mL 1  . lidocaine-prilocaine (EMLA) cream Apply 1 application topically as needed. 30 g 6  . losartan (COZAAR) 50 MG tablet TAKE 1 TABLET BY MOUTH EVERY DAY 90 tablet 3  . Multiple Vitamin (MULITIVITAMIN WITH MINERALS) TABS Take 1 tablet by mouth daily.    . prochlorperazine (COMPAZINE) 10 MG tablet Take 1 tablet (10 mg total) by mouth every 6 (six) hours as needed (Nausea or vomiting). 60 tablet 1  . spironolactone-hydrochlorothiazide (ALDACTAZIDE) 25-25 MG tablet TAKE 1 TABLET BY MOUTH EVERY DAY 30 tablet 2  . traMADol (ULTRAM) 50 MG tablet Take 1-2 tablets (50-100  mg total) by mouth every 6 (six) hours as needed. 60 tablet 0  . venlafaxine XR (EFFEXOR-XR) 37.5 MG 24 hr capsule TAKE 1 CAPSULE BY MOUTH DAILY WITH BREAKFAST. 90 capsule 0  . zinc gluconate 50 MG tablet Take 50 mg by mouth daily.     No current facility-administered medications for this visit.   Facility-Administered Medications Ordered in Other Visits  Medication Dose Route Frequency Provider Last Rate Last Admin  . ado-trastuzumab emtansine (KADCYLA) 300 mg  in sodium chloride 0.9 % 250 mL chemo infusion  3.6 mg/kg (Treatment Plan Recorded) Intravenous Once Analyssa Downs, Virgie Dad, MD      . heparin lock flush 100 unit/mL  500 Units Intracatheter Once PRN Kynnadi Dicenso, Virgie Dad, MD      . sodium chloride flush (NS) 0.9 % injection 10 mL  10 mL Intracatheter PRN Hobart Marte, Virgie Dad, MD        OBJECTIVE:  African-American woman examined in the infusion  There were no vitals filed for this visit. Wt Readings from Last 3 Encounters:  03/07/21 184 lb (83.5 kg)  02/07/21 182 lb 8 oz (82.8 kg)  12/27/20 180 lb 11.2 oz (82 kg)   There is no height or weight on file to calculate BMI.    For vitals associated with the 03/07/2021 visit please see the infusion area flowsheet  ECOG FS:1 - Symptomatic but completely ambulatory  Sclerae unicteric, EOMs intact Wearing a mask No cervical or supraclavicular adenopathy Lungs no rales or rhonchi Heart regular rate and rhythm Abd soft, nontender, positive bowel sounds Neuro: nonfocal, well oriented, appropriate affect Breasts: Deferred   LAB RESULTS:  CMP     Component Value Date/Time   NA 137 03/07/2021 1042   K 4.5 03/07/2021 1042   CL 112 (H) 03/07/2021 1042   CO2 18 (L) 03/07/2021 1042   GLUCOSE 97 03/07/2021 1042   BUN 40 (H) 03/07/2021 1042   CREATININE 1.67 (H) 03/07/2021 1042   CREATININE 1.56 (H) 07/31/2020 1400   CALCIUM 10.6 (H) 03/07/2021 1042   CALCIUM 11.2 (H) 02/24/2020 0830   PROT 7.2 03/07/2021 1042   ALBUMIN 3.0 (L)  03/07/2021 1042   AST 109 (H) 03/07/2021 1042   AST 134 (H) 07/31/2020 1400   ALT 89 (H) 03/07/2021 1042   ALT 81 (H) 07/31/2020 1400   ALKPHOS 147 (H) 03/07/2021 1042   BILITOT 0.5 03/07/2021 1042   BILITOT 0.9 07/31/2020 1400   GFRNONAA 37 (L) 03/07/2021 1042   GFRNONAA 39 (L) 07/31/2020 1400   GFRAA 41 (L) 08/30/2020 0808   GFRAA 45 (L) 07/31/2020 1400    Lab Results  Component Value Date   TOTALPROTELP 7.0 02/25/2019     Lab Results  Component Value Date   KPAFRELGTCHN 76.7 (H) 02/25/2019   LAMBDASER 27.2 (H) 02/25/2019   KAPLAMBRATIO 2.82 (H) 02/25/2019    Lab Results  Component Value Date   WBC 5.2 03/07/2021   NEUTROABS 3.5 03/07/2021   HGB 8.3 (L) 03/07/2021   HCT 25.7 (L) 03/07/2021   MCV 89.9 03/07/2021   PLT 80 (L) 03/07/2021   No results found for: LABCA2  No components found for: GYJEHU314  No results for input(s): INR in the last 168 hours.  No results found for: LABCA2  No results found for: HFW263  No results found for: ZCH885  No results found for: OYD741  Lab Results  Component Value Date   CA2729 38.7 (H) 02/25/2019    No components found for: HGQUANT  No results found for: CEA1 / No results found for: CEA1   No results found for: AFPTUMOR  No results found for: CHROMOGRNA  No results found for: HGBA, HGBA2QUANT, HGBFQUANT, HGBSQUAN (Hemoglobinopathy evaluation)   No results found for: LDH  No results found for: IRON, TIBC, IRONPCTSAT (Iron and TIBC)  Lab Results  Component Value Date   FERRITIN 98 12/27/2020    Urinalysis    Component Value Date/Time   COLORURINE YELLOW 08/18/2019 [place]  08/18/2019 1420   LABSPEC 1.016 08/18/2019 1420   PHURINE 5.0 08/18/2019 1420   GLUCOSEU NEGATIVE 08/18/2019 1420   HGBUR NEGATIVE 08/18/2019 1420   BILIRUBINUR NEGATIVE 08/18/2019 1420   KETONESUR NEGATIVE 08/18/2019 1420   PROTEINUR NEGATIVE 08/18/2019 1420   UROBILINOGEN 0.2 11/29/2011 1656   NITRITE  NEGATIVE 08/18/2019 1420   LEUKOCYTESUR SMALL (A) 08/18/2019 1420    STUDIES: NM PET Image Restag (PS) Skull Base To Thigh  Result Date: 02/26/2021 CLINICAL DATA:  Subsequent treatment strategy for RIGHT-sided breast cancer. EXAM: NUCLEAR MEDICINE PET SKULL BASE TO THIGH TECHNIQUE: 9.0 mCi F-18 FDG was injected intravenously. Full-ring PET imaging was performed from the skull base to thigh after the radiotracer. CT data was obtained and used for attenuation correction and anatomic localization. Fasting blood glucose: 99 mg/dl COMPARISON:  PET-CT 60/47/9987, PET-CT scan 10/26/2019, PET-CT 03/11/2019 FINDINGS: Mediastinal blood pool activity: SUV max 3.0 Liver activity: SUV max NA NECK: No hypermetabolic lymph nodes in the neck. Incidental CT findings: none CHEST: New hypermetabolic prevascular lymph node measures 9 mm (image 58) with SUV max equal 8.3. Hypermetabolic RIGHT perihilar mass measures approximately 1.5 cm (image 66) with SUV max equal 16.3. This is increased from SUV max equal 10.2. Lesion is similar in size to prior. Incidental CT findings: Post RIGHT mastectomy anatomy. Soft tissue thickening in the RIGHT axilla adjacent to the lymphadenectomy clips without significant metabolic activity. ABDOMEN/PELVIS: No abnormal hypermetabolic activity within the liver, pancreas, adrenal glands, or spleen. No hypermetabolic lymph nodes in the abdomen or pelvis. Productive Incidental CT findings: none SKELETON: Hypermetabolic sclerotic lesion in the RIGHT ischium posterior to the acetabulum measures 11 mm (image 162). Lesion is not changed in size however increase in metabolic activity SUV max equal 8.1. No metabolic activity on most recent comparison PET-CT scan. This lesion was hypermetabolic on more remote PET-CT scan 10/14/2019. Incidental CT findings: No metabolic activity associated with other sclerotic lesions within the sternum and thoracic spine. IMPRESSION: 1. New hypermetabolic prevascular lymph  node consistent with nodal metastasis. 2. Interval increase in metabolic activity of RIGHT hilar nodule consistent with mild progression. 3. Recurrence of metabolic activity within sclerotic lesion in the RIGHT ischium adjacent to the acetabulum is concerning for recurrence of skeletal metastasis. Other sclerotic lesion without metabolic activity. Electronically Signed   By: Genevive Bi M.D.   On: 02/26/2021 12:10   ECHOCARDIOGRAM COMPLETE  Result Date: 02/14/2021    ECHOCARDIOGRAM REPORT   Patient Name:   [name] [name] Date of Exam: 02/13/2021 Medical Rec #:  [id number]     Height:       59.0 in Accession #:    [id number]    Weight:       182.5 lb Date of Birth:  1971-05-21     BSA:          1.774 m Patient Age:    49 years      BP:           114/55 mmHg Patient Gender: F             HR:           81 bpm. Exam Location:  Outpatient Procedure: 2D Echo, 3D Echo, Strain Analysis, Color Doppler and Cardiac Doppler Indications:    Chemo Z09  History:        Patient has prior history of Echocardiogram examinations, most                 recent 11/10/2020. TIA; Risk Factors:Hypertension.  Anemia.                 Chronic renal failure.  Sonographer:    Darlina Sicilian RDCS Referring Phys: [place]  1. Left ventricular ejection fraction, by estimation, is 60 to 65%. The left ventricle has normal function. The left ventricle has no regional wall motion abnormalities. There is mild left ventricular hypertrophy. Left ventricular diastolic parameters were normal. The average left ventricular global longitudinal strain is -23.3 %. The global longitudinal strain is normal.  2. Right ventricular systolic function is normal. The right ventricular size is normal. Tricuspid regurgitation signal is inadequate for assessing PA pressure.  3. The mitral valve is normal in structure. No evidence of mitral valve regurgitation. No evidence of mitral stenosis.  4. The aortic valve is tricuspid. Aortic valve  regurgitation is not visualized. No aortic stenosis is present.  5. The inferior vena cava is normal in size with greater than 50% respiratory variability, suggesting right atrial pressure of 3 mmHg. Conclusion(s)/Recommendation(s): Normal biventricular function without evidence of hemodynamically significant valvular heart disease. FINDINGS  Left Ventricle: Left ventricular ejection fraction, by estimation, is 60 to 65%. The left ventricle has normal function. The left ventricle has no regional wall motion abnormalities. The average left ventricular global longitudinal strain is -23.3 %. The global longitudinal strain is normal. 3D left ventricular ejection fraction analysis performed but not reported based on interpreter judgement due to suboptimal quality. The left ventricular internal cavity size was normal in size. There is mild left  ventricular hypertrophy. Left ventricular diastolic parameters were normal. Right Ventricle: The right ventricular size is normal. No increase in right ventricular wall thickness. Right ventricular systolic function is normal. Tricuspid regurgitation signal is inadequate for assessing PA pressure. Left Atrium: Left atrial size was normal in size. Right Atrium: Right atrial size was normal in size. Pericardium: Trivial pericardial effusion is present. Mitral Valve: The mitral valve is normal in structure. No evidence of mitral valve regurgitation. No evidence of mitral valve stenosis. Tricuspid Valve: The tricuspid valve is normal in structure. Tricuspid valve regurgitation is not demonstrated. No evidence of tricuspid stenosis. Aortic Valve: The aortic valve is tricuspid. Aortic valve regurgitation is not visualized. No aortic stenosis is present. Pulmonic Valve: The pulmonic valve was normal in structure. Pulmonic valve regurgitation is not visualized. No evidence of pulmonic stenosis. Aorta: The aortic root is normal in size and structure. Venous: The inferior vena cava is  normal in size with greater than 50% respiratory variability, suggesting right atrial pressure of 3 mmHg. IAS/Shunts: No atrial level shunt detected by color flow Doppler.  LEFT VENTRICLE PLAX 2D LVIDd:         4.60 cm  Diastology LVIDs:         3.10 cm  LV e' medial:    5.00 cm/s LV PW:         1.00 cm  LV E/e' medial:  12.8 LV IVS:        1.10 cm  LV e' lateral:   8.16 cm/s LVOT diam:     1.80 cm  LV E/e' lateral: 7.9 LV SV:         68 LV SV Index:   38       2D Longitudinal Strain LVOT Area:     2.54 cm 2D Strain GLS Avg:     -23.3 %  3D Volume EF:                         3D EF:        55 %                         LV EDV:       135 ml                         LV ESV:       61 ml                         LV SV:        74 ml RIGHT VENTRICLE RV S prime:     16.80 cm/s TAPSE (M-mode): 2.3 cm LEFT ATRIUM             Index       RIGHT ATRIUM          Index LA diam:        3.80 cm 2.14 cm/m  RA Area:     8.99 cm LA Vol (A2C):   31.0 ml 17.45 ml/m RA Volume:   15.30 ml 8.62 ml/m LA Vol (A4C):   37.8 ml 21.31 ml/m LA Biplane Vol: 35.6 ml 20.07 ml/m  AORTIC VALVE LVOT Vmax:   134.00 cm/s LVOT Vmean:  88.900 cm/s LVOT VTI:    0.268 m  AORTA Ao Root diam: 3.20 cm Ao Asc diam:  3.10 cm MITRAL VALVE MV Area (PHT): 2.79 cm    SHUNTS MV Decel Time: 272 msec    Systemic VTI:  0.27 m MV E velocity: 64.10 cm/s  Systemic Diam: 1.80 cm MV A velocity: 57.60 cm/s MV E/A ratio:  1.11 Cherlynn Kaiser MD Electronically signed by Cherlynn Kaiser MD Signature Date/Time: 02/14/2021/12:58:18 AM    Final      ELIGIBLE FOR AVAILABLE RESEARCH PROTOCOL: no   ASSESSMENT: 50 y.o. [place], [place] woman  (1) status post left lumpectomy in 2000 for a (?) T2N0 breast cancer,  (a) status post adjuvant chemotherapy  (b) status post adjuvant radiation  (c) did not receive antiestrogens  (2) genetics testing May 18, 2018 through the common Hereditary Cancers Panel + Myelodysplastic Syndrome/Leukemia Panel found no  deleterious mutations in APC, ATM, AXIN2, BARD1, BLM, BMPR1A, BRCA1, BRCA2, BRIP1, CDH1, CDK4, CDKN2A (p14ARF), CDKN2A (p16INK4a), CEBPA, CHEK2, CTNNA1, DICER1, EPCAM*, GATA2, GREM1*, HRAS, KIT, MEN1, MLH1, MSH2, MSH3, MSH6, MUTYH, NBN, NF1, PALB2, PDGFRA, PMS2, POLD1, POLE, PTEN, RAD50, RAD51C, RAD51D, RUNX1, SDHB, SDHC, SDHD, SMAD4, SMARCA4, STK11, TERC, TERT, TP53, TSC1, TSC2, VHL The following genes were evaluated for sequence changes only: HOXB13*, NTHL1*, SDHA  (a)  3 variants of uncertain significance were identified in the genes BARD1 c.764A>G (p.Asn255Ser), BRIP1 c.2563C>T (p.Arg855Cys), and PALB2 c.3103A>T (p.Ile1035Phe).   (3) status post right breast biopsy 03/16/2018 for a clinical mT2-3 N2, anatomic stage III invasive ductal carcinoma, grade 2, triple positive, with an MIB-1 of 80%.  (a) bone scan and chest CT scan negative for metastases except for a T6 lytic lesion of concern  (b) thoracic spine MRI was ordered May 2019 but never performed.  (4) neoadjuvant chemotherapy consisting of carboplatin, docetaxel, trastuzumab and Pertuzumab every 3 weeks x 6 starting 04/16/2018  (a) pertuzumab held beginning with cycle 2 because of diarrhea  (5) trastuzumab was to be continued Q4w indefinitely,  discontinued after 10/21/2019 dose with progression  (a) echocardiogram on 06/29/2018 showed an ejection fraction in the 65-70% range  (b) echocardiogram 11/19/2018 showed an ejection fraction in the 60-65% range  (c) echo 04/07/2019 shows an ejection fraction in the 55-60% range  (d) echo 07/15/2019 shows EF of 60-65%--for additional echoes see under #12 below  (6) right mastectomy and sentinel lymph node sampling on 08/26/2018 found a residual 2.5cm invasive ductal carcinoma, with 2 of 5 sentinel lymph nodes positive, ypT2, ypN1a; margins were clear  (a) repeat prognostic panel confirms tumor still triple positive (HER2 3+)  (7) adjuvant radiation 09/30/2018 - 11/16/2018 Site/dose:   The  patient initially received a dose of 50.4 Gy in 28 fractions to the right chest wall and supraclavicular region. This was delivered using a 3-D conformal, 4 field technique. The patient then received a boost to the mastectomy scar. This delivered an additional 10 Gy in 5 fractions using an en face electron field. The total dose was 60.4   (8) anastrozole started 12/03/2018  (a) [place] on 10/01/2018 was 64.3, and estradiol 7.0, consistent with menopause  (b) referral to pelvic floor rehabilitation 12/03/2018  (c) [place] on 03/16/2020 was 80.8 with estradiol less than 2.5   METASTATIC DISEASE: March 2020 (9) CT of neck and CT angiography of the chest 02/04/2019 finds the T6 lytic lesion noted May 2019 (see #3 above) is now sclerotic; a sclerotic T3 lesion is stable; there is a new lytic lesion in the manubrium  (a) PET scan 03/12/2019 shows manubrium, T3 and T5 metastases, no visceral disease  (b) biopsy of manubrial lesion planned but not done secondary to pandemic  (c) Bone scan 09/01/19 shows ? 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 Chest_sternum // 40 Gy in 10 fractions  2. Thoracic Spine (T3) // 18 Gy in 1 fraction   3. Thoracic Spine (T5) // 18 Gy in 1 fraction  (11) denosumab/Xgeva started 09/08/2019, currently given every 6 weeks  (12) T-DM1 started 01/15/20201  (a) echo 10/26/2019 shows stable EF at  55-60%  (b) echo 03/08/2020 shows an ejection fraction in the 70-75% range  (c) echo 07/12/2020 shows an ejection fraction in the 65 to 70% range.  (d) PET scan 03/08/2020 shows no active disease; a 0.9 cm subcutaneous nodule in the left shoulder area seen on the PET was not identifiable by exam 03/16/2020  (e) PET 08/24/2020 shows stable disease except for a new 0.6 cm lymph node with an SUV of 10.1  (f) bronchoscopy 09/05/2020 with endobronchial ultrasound found no detectable mass in the region noted on the September PET scan  (g) T-DM1 discontinued and trastuzumab resumed 11/29/2020  (h) PET scan 02/26/2021 shows mild nodular increase: TDM-1resumed 03/07/2021   PLAN: [name] had many questions regarding her PET scan results and we went over that.  I also gave her a written copy.  Basically we see definite although marginal evidence of disease progression.  That is why were going  back to T-DM1, which she tolerated generally well before and which was effective  We have the option of Enhertu to follow back on if the current treatment does not work.  She will need a repeat echocardiogram in June.  Her kidney function is poor but stable.  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  She does not seem to be compromised by this but we can add Retacrit if necessary.  I will see her again in 6 weeks.  She knows to call for any other issue that may develop before then  Total encounter time 25 minutes.Sarajane Jews C. Ariani Seier, MD 03/07/21 11:58 AM Medical Oncology and Hematology [facility] [place], [place] [address] Tel. [phone]    Fax. [phone]   I, Wilburn Mylar, am acting as scribe for Dr. Virgie Dad. Almer Bushey.  I, Lurline Del MD, have reviewed the above documentation for accuracy and completeness, and I agree with the above.   *Total Encounter Time as defined by the Centers for Medicare and Medicaid Services includes, in addition  to the face-to-face time of a patient visit (documented in the note above) non-face-to-face time: obtaining and reviewing outside history, ordering and reviewing medications, tests or procedures, care coordination (communications with other health care professionals or caregivers) and documentation in the medical record.

## 2021-03-07 NOTE — Progress Notes (Signed)
Per Dr. Jana Hakim, ok to treat with Scr 1.67. Patient was observed for 30 minutes post infusion with no complaints. She was advised to call for any concerns. Patient in no distress upon leaving infusion clinic.

## 2021-03-13 ENCOUNTER — Telehealth: Payer: Self-pay | Admitting: Oncology

## 2021-03-13 NOTE — Telephone Encounter (Signed)
Scheduled appts per 4/6 los. Pt aware.  

## 2021-03-14 ENCOUNTER — Ambulatory Visit: Payer: BC Managed Care – PPO | Admitting: Oncology

## 2021-03-14 ENCOUNTER — Other Ambulatory Visit: Payer: BC Managed Care – PPO

## 2021-03-14 ENCOUNTER — Ambulatory Visit: Payer: BC Managed Care – PPO

## 2021-03-28 ENCOUNTER — Other Ambulatory Visit: Payer: BC Managed Care – PPO

## 2021-03-28 ENCOUNTER — Inpatient Hospital Stay: Payer: BC Managed Care – PPO

## 2021-03-28 ENCOUNTER — Other Ambulatory Visit: Payer: Self-pay

## 2021-03-28 VITALS — BP 130/66 | HR 78 | Temp 98.8°F | Resp 18 | Wt 183.8 lb

## 2021-03-28 DIAGNOSIS — Z95828 Presence of other vascular implants and grafts: Secondary | ICD-10-CM

## 2021-03-28 DIAGNOSIS — C50411 Malignant neoplasm of upper-outer quadrant of right female breast: Secondary | ICD-10-CM | POA: Diagnosis not present

## 2021-03-28 DIAGNOSIS — Z170 Estrogen receptor positive status [ER+]: Secondary | ICD-10-CM

## 2021-03-28 DIAGNOSIS — Z7189 Other specified counseling: Secondary | ICD-10-CM

## 2021-03-28 DIAGNOSIS — Z5112 Encounter for antineoplastic immunotherapy: Secondary | ICD-10-CM | POA: Diagnosis not present

## 2021-03-28 DIAGNOSIS — N184 Chronic kidney disease, stage 4 (severe): Secondary | ICD-10-CM | POA: Diagnosis not present

## 2021-03-28 DIAGNOSIS — D631 Anemia in chronic kidney disease: Secondary | ICD-10-CM | POA: Diagnosis not present

## 2021-03-28 DIAGNOSIS — Z79899 Other long term (current) drug therapy: Secondary | ICD-10-CM | POA: Diagnosis not present

## 2021-03-28 DIAGNOSIS — C7951 Secondary malignant neoplasm of bone: Secondary | ICD-10-CM | POA: Diagnosis not present

## 2021-03-28 LAB — COMPREHENSIVE METABOLIC PANEL
ALT: 69 U/L — ABNORMAL HIGH (ref 0–44)
AST: 108 U/L — ABNORMAL HIGH (ref 15–41)
Albumin: 3 g/dL — ABNORMAL LOW (ref 3.5–5.0)
Alkaline Phosphatase: 157 U/L — ABNORMAL HIGH (ref 38–126)
Anion gap: 11 (ref 5–15)
BUN: 60 mg/dL — ABNORMAL HIGH (ref 6–20)
CO2: 18 mmol/L — ABNORMAL LOW (ref 22–32)
Calcium: 11.2 mg/dL — ABNORMAL HIGH (ref 8.9–10.3)
Chloride: 111 mmol/L (ref 98–111)
Creatinine, Ser: 2.19 mg/dL — ABNORMAL HIGH (ref 0.44–1.00)
GFR, Estimated: 27 mL/min — ABNORMAL LOW (ref >60)
Glucose, Bld: 141 mg/dL — ABNORMAL HIGH (ref 70–99)
Potassium: 4.2 mmol/L (ref 3.5–5.1)
Sodium: 140 mmol/L (ref 135–145)
Total Bilirubin: 0.5 mg/dL (ref 0.3–1.2)
Total Protein: 7.7 g/dL (ref 6.5–8.1)

## 2021-03-28 LAB — CBC WITH DIFFERENTIAL/PLATELET
Abs Immature Granulocytes: 0.02 10*3/uL (ref 0.00–0.07)
Basophils Absolute: 0 10*3/uL (ref 0.0–0.1)
Basophils Relative: 1 %
Eosinophils Absolute: 0.1 10*3/uL (ref 0.0–0.5)
Eosinophils Relative: 2 %
HCT: 25 % — ABNORMAL LOW (ref 36.0–46.0)
Hemoglobin: 8.1 g/dL — ABNORMAL LOW (ref 12.0–15.0)
Immature Granulocytes: 0 %
Lymphocytes Relative: 21 %
Lymphs Abs: 1.2 10*3/uL (ref 0.7–4.0)
MCH: 28.7 pg (ref 26.0–34.0)
MCHC: 32.4 g/dL (ref 30.0–36.0)
MCV: 88.7 fL (ref 80.0–100.0)
Monocytes Absolute: 0.5 10*3/uL (ref 0.1–1.0)
Monocytes Relative: 9 %
Neutro Abs: 4 10*3/uL (ref 1.7–7.7)
Neutrophils Relative %: 67 %
Platelets: 107 10*3/uL — ABNORMAL LOW (ref 150–400)
RBC: 2.82 MIL/uL — ABNORMAL LOW (ref 3.87–5.11)
RDW: 15.7 % — ABNORMAL HIGH (ref 11.5–15.5)
WBC: 5.9 10*3/uL (ref 4.0–10.5)
nRBC: 0 % (ref 0.0–0.2)

## 2021-03-28 MED ORDER — EPOETIN ALFA-EPBX 10000 UNIT/ML IJ SOLN
INTRAMUSCULAR | Status: AC
  Filled 2021-03-28: qty 2

## 2021-03-28 MED ORDER — SODIUM CHLORIDE 0.9% FLUSH
10.0000 mL | Freq: Once | INTRAVENOUS | Status: AC
  Administered 2021-03-28: 10 mL via INTRAVENOUS
  Filled 2021-03-28: qty 10

## 2021-03-28 MED ORDER — DENOSUMAB 120 MG/1.7ML ~~LOC~~ SOLN
120.0000 mg | Freq: Once | SUBCUTANEOUS | Status: AC
  Administered 2021-03-28: 120 mg via SUBCUTANEOUS

## 2021-03-28 MED ORDER — DIPHENHYDRAMINE HCL 25 MG PO CAPS
ORAL_CAPSULE | ORAL | Status: AC
  Filled 2021-03-28: qty 1

## 2021-03-28 MED ORDER — EPOETIN ALFA-EPBX 10000 UNIT/ML IJ SOLN
20000.0000 [IU] | Freq: Once | INTRAMUSCULAR | Status: AC
  Administered 2021-03-28: 20000 [IU] via SUBCUTANEOUS

## 2021-03-28 MED ORDER — HEPARIN SOD (PORK) LOCK FLUSH 100 UNIT/ML IV SOLN
500.0000 [IU] | Freq: Once | INTRAVENOUS | Status: AC | PRN
  Administered 2021-03-28: 500 [IU]
  Filled 2021-03-28: qty 5

## 2021-03-28 MED ORDER — DIPHENHYDRAMINE HCL 25 MG PO CAPS
25.0000 mg | ORAL_CAPSULE | Freq: Once | ORAL | Status: AC
  Administered 2021-03-28: 25 mg via ORAL

## 2021-03-28 MED ORDER — DENOSUMAB 120 MG/1.7ML ~~LOC~~ SOLN
SUBCUTANEOUS | Status: AC
  Filled 2021-03-28: qty 1.7

## 2021-03-28 MED ORDER — SODIUM CHLORIDE 0.9% FLUSH
10.0000 mL | INTRAVENOUS | Status: DC | PRN
  Administered 2021-03-28: 10 mL
  Filled 2021-03-28: qty 10

## 2021-03-28 MED ORDER — ACETAMINOPHEN 325 MG PO TABS
ORAL_TABLET | ORAL | Status: AC
  Filled 2021-03-28: qty 2

## 2021-03-28 MED ORDER — SODIUM CHLORIDE 0.9 % IV SOLN
Freq: Once | INTRAVENOUS | Status: AC
  Filled 2021-03-28: qty 250

## 2021-03-28 MED ORDER — SODIUM CHLORIDE 0.9 % IV SOLN
3.6000 mg/kg | Freq: Once | INTRAVENOUS | Status: AC
  Administered 2021-03-28: 300 mg via INTRAVENOUS
  Filled 2021-03-28: qty 15

## 2021-03-28 MED ORDER — ACETAMINOPHEN 325 MG PO TABS
650.0000 mg | ORAL_TABLET | Freq: Once | ORAL | Status: AC
  Administered 2021-03-28: 650 mg via ORAL

## 2021-03-28 NOTE — Progress Notes (Signed)
Per [name], ok to treat with Ast 108 and Creat 2.19.

## 2021-03-28 NOTE — Patient Instructions (Signed)
[name]     Discharge Instructions:  Thank you for choosing [facility] to provide your oncology and hematology care.   If you have a lab appointment with the [facility], please go directly to the [facility] and check in at the registration area.   Wear comfortable clothing and clothing appropriate for easy access to any Portacath or PICC line.   We strive to give you quality time with your provider. You may need to reschedule your appointment if you arrive late (15 or more minutes).  Arriving late affects you and other patients whose appointments are after yours.  Also, if you miss three or more appointments without notifying the office, you may be dismissed from the clinic at the provider's discretion.      For prescription refill requests, have your pharmacy contact our office and allow 72 hours for refills to be completed.    Today you received the following chemotherapy and/or immunotherapy agents ado-trastuzumab emtansine, denosumab, epoetin alfa-epbx.   To help prevent nausea and vomiting after your treatment, we encourage you to take your nausea medication as directed.  BELOW ARE SYMPTOMS THAT SHOULD BE REPORTED IMMEDIATELY: . *FEVER GREATER THAN 100.4 F (38 C) OR HIGHER . *CHILLS OR SWEATING . *NAUSEA AND VOMITING THAT IS NOT CONTROLLED WITH YOUR NAUSEA MEDICATION . *UNUSUAL SHORTNESS OF BREATH . *UNUSUAL BRUISING OR BLEEDING . *URINARY PROBLEMS (pain or burning when urinating, or frequent urination) . *BOWEL PROBLEMS (unusual diarrhea, constipation, pain near the anus) . TENDERNESS IN MOUTH AND THROAT WITH OR WITHOUT PRESENCE OF ULCERS (sore throat, sores in mouth, or a toothache) . UNUSUAL RASH, SWELLING OR PAIN  . UNUSUAL VAGINAL DISCHARGE OR ITCHING   Items with * indicate a potential emergency and should be followed up as soon as possible or go to the Emergency Department if any problems should occur.  Please show  the CHEMOTHERAPY ALERT CARD or IMMUNOTHERAPY ALERT CARD at check-in to the Emergency Department and triage nurse.  Should you have questions after your visit or need to cancel or reschedule your appointment, please contact [facility]  Dept: [phone]  and follow the prompts.  Office hours are 8:00 a.m. to 4:30 p.m. Monday - Friday. Please note that voicemails left after 4:00 p.m. may not be returned until the following business day.  We are closed weekends and major holidays. You have access to a nurse at all times for urgent questions. Please call the main number to the clinic Dept: [phone] and follow the prompts.   For any non-urgent questions, you may also contact your provider using MyChart. We now offer e-Visits for anyone 71 and older to request care online for non-urgent symptoms. For details visit [email or web address].   Also download the MyChart app! Go to the app store, search "MyChart", open the app, select [facility], and log in with your MyChart username and password.  Due to Covid, a mask is required upon entering the hospital/clinic. If you do not have a mask, one will be given to you upon arrival. For doctor visits, patients may have 1 support person aged 46 or older with them. For treatment visits, patients cannot have anyone with them due to current Covid guidelines and our immunocompromised population.

## 2021-04-06 ENCOUNTER — Telehealth: Payer: Self-pay | Admitting: *Deleted

## 2021-04-06 MED ORDER — MAGIC MOUTHWASH: 5.0000 mL | Freq: Four times a day (QID) | ORAL | 1 refills | Status: DC | PRN

## 2021-04-06 NOTE — Telephone Encounter (Signed)
VM left by the patient requesting a prescription for MMW due to " having sores in my mouth".  This RN attempted to return call to pt- obtained identified VM- message left informing her MMW will be sent to her pharmacy but requested a return call due to noted pt has acyclovir on med list- need to assess mouth sores further  And verify if she needs to change her acyclovir dose.  This RN's name and number given for return call.

## 2021-04-16 NOTE — Progress Notes (Incomplete)
[place]  [phone] [phone]    ID: [name] [name] DOB: Apr 24, 1971  MR#: [id number]  WVP#:[id number]  Patient Care Team: Marda Stalker, PA-C as PCP - General (Family Medicine) Jerline Pain, MD as PCP - Cardiology (Cardiology) Magrinat, Virgie Dad, MD as Consulting Physician (Oncology) Loreta Ave, MD as Referring Physician (Internal Medicine) Donato Heinz, MD as Consulting Physician (Nephrology) Kyung Rudd, MD as Consulting Physician (Radiation Oncology) Jovita Kussmaul, MD as Consulting Physician (General Surgery) Larey Dresser, MD as Consulting Physician (Cardiology) Mauro Kaufmann, RN as Oncology Nurse Navigator Rockwell Germany, RN as Oncology Nurse Navigator OTHER MD:   CHIEF COMPLAINT: Triple positive breast cancer (s/p right mastectomy)  CURRENT TREATMENT: trastuzumab; anastrozole; Xgeva (Q 6 w); retacrit   INTERVAL HISTORY: [name] returns today for follow-up and treatment of her triple positive breast cancer.   We switched back to T-DM1 at her last visit on 03/07/2021.  She receives this every 3 weeks.  She underwent repeat echocardiogram on 02/13/2021 showing an ejection fraction of 60-65%. This is stable from prior in 11/2020.  She continues on anastrozole.  She has no unusual side effects from this other than mild vaginal dryness which also of course is due to menopause  She last received denosumab/Xgeva on 03/28/2021.  We are doing this every 6 weeks.  She has no side effects from this that she is aware of.     REVIEW OF SYSTEMS: [name]    COVID 19 VACCINATION STATUS: fully vaccinated [employer]), most recently April 2021.   HISTORY OF CURRENT ILLNESS: From the original intake note:  [name] Furniss palpated a lump on 01/15/2018. She felt soreness and shooting pain under her arm and in the right breast. She underwent bilateral diagnostic mammography with tomography and right breast ultrasonography at [facility] on  03/12/2018 showing: breast density category C. The is architectural distortion at the 11 o'clock position. There is also an oval mass in the right breast anterior depth. Examination of the right axilla showed enlarged lymph nodes. Ultrasonography showed a 4.6 cm mass in the right breast upper outer quadrant posterior depth. An additional 1.2 cm oval mass in the right breast 12 o'clock middle depth. The lymph nodes in the right axillary are highly suggestive of malignancy.   Accordingly on 03/16/2018 she proceeded to biopsy of the right breast area in question. The pathology from this procedure showed ([id number]): At both the 11 and 12 o'clock positions: Invasive ductal carcinoma grade II. Ductal carcinoma in situ, high grade, with necrosis and calcifications. Prognostic indicators significant for: estrogen receptor, 90% positive and progesterone receptor, 40% positive, both with strong staining intensity. Proliferation marker Ki67 at 80%. HER2 amplified with ratios HER2/CEP17 SIGNALS 6.90 and average HER2 copies per cell 14.50  The patient's subsequent history is as detailed below.   PAST MEDICAL HISTORY: Past Medical History:  Diagnosis Date  . Anxiety   . Breast cancer, left breast ([place]) 2000   Underwent lumpectomy, chemotherapy and radiation  . Facial paralysis/Bells palsy    left side  . Family history of lung cancer   . Family history of lymphoma   . Hypertension   . Neuropathy    IN FINGERS AND TOES   RECENT INFUSION OF CHEMO  . Personal history of breast cancer 05/06/2018  . Renal disorder    just after TIA acute kidney injury  . TIA (transient ischemic attack) 03/16/2017   [facility]  The patient has a previous history of left breast  cancer diagnosed in 2000. She was seen by [facility] in [place], [place] under Dr. Loreta Ave. According to the patient, her left breast cancer was stage II with no lymph node involvement (likely T2N0). She had chemotherapy every 3  weeks for about 6 months. She did not takes any "red drugs." She also did not take anti-estrogens (likely ER/PR negative).   TIA in 2018. She saw a neurologist as a precaution. She denies any clotting issues.    PAST SURGICAL HISTORY: Past Surgical History:  Procedure Laterality Date  . BREAST LUMPECTOMY WITH AXILLARY LYMPH NODE BIOPSY Left 2000   biopsy  . BREAST SURGERY     partial mastectomy with lymph node removal  . ENDOBRONCHIAL ULTRASOUND Bilateral 09/05/2020   Procedure: ENDOBRONCHIAL ULTRASOUND;  Surgeon: Collene Gobble, MD;  Location: [facility];  Service: Cardiopulmonary;  Laterality: Bilateral;  . MASTECTOMY Right 2019   & partial mastectomy left breast 2000  . MASTECTOMY MODIFIED RADICAL Right 08/26/2018   Procedure: MASTECTOMY MODIFIED RADICAL;  Surgeon: Jovita Kussmaul, MD;  Location: [place];  Service: General;  Laterality: Right;  . MODIFIED MASTECTOMY Right 08/26/2018  . PORTACATH PLACEMENT Left 04/08/2018   Procedure: INSERTION PORT-A-CATH;  Surgeon: Jovita Kussmaul, MD;  Location: [place];  Service: General;  Laterality: Left;  . TUBAL LIGATION Bilateral 2004  . VIDEO BRONCHOSCOPY N/A 09/05/2020   Procedure: VIDEO BRONCHOSCOPY WITHOUT FLUORO;  Surgeon: Collene Gobble, MD;  Location: [facility];  Service: Cardiopulmonary;  Laterality: N/A;  . WISDOM TOOTH EXTRACTION      FAMILY HISTORY Family History  Problem Relation Age of Onset  . Hypertension Mother   . Hypertension Father   . Diverticulosis Father   . Diabetes Father   . Lung cancer Father 53       hx smoking  . Hypertension Maternal Grandmother   . CAD Maternal Grandmother   . Alzheimer's disease Maternal Grandmother        died at 50  . Hypertension Paternal Grandmother   . CAD Paternal Grandmother   . Stroke Paternal Grandfather   . Sickle cell trait Paternal Grandfather   . Pneumonia Maternal Aunt        died at a young adult  . Lymphoma Paternal Aunt 66  The patient's father is alive at age 39  and has a history of lung cancer. The patient's mother is alive at age 34. The patient has 1 brother and no sisters. She denies a family history of breast or ovarian cancer.     GYNECOLOGIC HISTORY:  Patient's last menstrual period was 12/21/2017. Menarche: 50 years old Age at first live birth: 50 years old She is GXP3.  Her LMP was  January 2019.  The patient used oral contraceptive from [phone] and the Depo-provera shot from [phone] with no complications. She never used HRT.  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  She also worked at [employer] as a Occupational psychologist.  She is now disabled.  Her husband, [name], is a Administrator. The patient's son, Shea Stakes age 46, works at [employer] in [place]. The patient's daughters, Lilia Pro age 48, and Levonne Spiller age 2, are students. The patient's adopted son, Vonna Kotyk age 29, is also a Ship broker.    ADVANCED DIRECTIVES: In the absence of any documents to the contrary the patient's husband is her healthcare power of attorney   HEALTH MAINTENANCE: Social History  Tobacco Use  . Smoking status: Never Smoker  . Smokeless tobacco: Never Used  Vaping Use  . Vaping Use: Never used  Substance Use Topics  . Alcohol use: No  . Drug use: No     Colonoscopy: n/a  PAP: 2015  Bone density: never done   Allergies  Allergen Reactions  . Ace Inhibitors Swelling    Swelling of lips and face  . Lisinopril Swelling    Swelling of lips, face  . Amlodipine Swelling    Leg swelling  . Shellfish Allergy Swelling  . Adhesive [Tape] Itching and Rash    Please use paper tape  . Latex Itching and Rash    Current Outpatient Medications  Medication Sig Dispense Refill  . acyclovir (ZOVIRAX) 400 MG tablet TAKE 1 TABLET BY MOUTH TWICE A DAY 180 tablet 1  . anastrozole (ARIMIDEX) 1 MG tablet Take 1 tablet (1 mg total) by mouth daily. 90 tablet 4  . Ascorbic  Acid (VITAMIN C) 1000 MG tablet Take 1,000 mg by mouth daily.    . carvedilol (COREG) 3.125 MG tablet TAKE 1 TABLET (3.125 MG TOTAL) BY MOUTH 2 (TWO) TIMES DAILY WITH A MEAL. 180 tablet 2  . Cholecalciferol (VITAMIN D3 PO) Take by mouth daily.    . Cyanocobalamin (VITAMIN B12) 500 MCG TABS Take 500 mcg by mouth daily.     . ferrous sulfate 325 (65 FE) MG tablet Take 1 tablet (325 mg total) by mouth daily. 30 tablet 0  . lidocaine (XYLOCAINE) 2 % solution Use as directed 5 mLs in the mouth or throat as needed for mouth pain. Swish and spit 200 mL 1  . lidocaine-prilocaine (EMLA) cream Apply 1 application topically as needed. 30 g 6  . losartan (COZAAR) 50 MG tablet TAKE 1 TABLET BY MOUTH EVERY DAY 90 tablet 3  . magic mouthwash SOLN Take 5 mLs by mouth 4 (four) times daily as needed for mouth pain. 240 mL 1  . Multiple Vitamin (MULITIVITAMIN WITH MINERALS) TABS Take 1 tablet by mouth daily.    . prochlorperazine (COMPAZINE) 10 MG tablet Take 1 tablet (10 mg total) by mouth every 6 (six) hours as needed (Nausea or vomiting). 60 tablet 1  . spironolactone-hydrochlorothiazide (ALDACTAZIDE) 25-25 MG tablet TAKE 1 TABLET BY MOUTH EVERY DAY 30 tablet 2  . traMADol (ULTRAM) 50 MG tablet Take 1-2 tablets (50-100 mg total) by mouth every 6 (six) hours as needed. 60 tablet 0  . venlafaxine XR (EFFEXOR-XR) 37.5 MG 24 hr capsule TAKE 1 CAPSULE BY MOUTH DAILY WITH BREAKFAST. 90 capsule 0  . zinc gluconate 50 MG tablet Take 50 mg by mouth daily.     No current facility-administered medications for this visit.    OBJECTIVE:  African-American woman examined in the infusion  There were no vitals filed for this visit. Wt Readings from Last 3 Encounters:  03/28/21 183 lb 12 oz (83.3 kg)  03/07/21 184 lb (83.5 kg)  02/07/21 182 lb 8 oz (82.8 kg)   There is no height or weight on file to calculate BMI.    For vitals associated with the 03/07/2021 visit please see the infusion area flowsheet  ECOG FS:1 -  Symptomatic but completely ambulatory  Sclerae unicteric, EOMs intact Wearing a mask No cervical or supraclavicular adenopathy Lungs no rales or rhonchi Heart regular rate and rhythm Abd soft, nontender, positive bowel sounds MSK no focal spinal tenderness, no upper extremity lymphedema Neuro: nonfocal, well oriented, appropriate affect Breasts:    {  Sclerae unicteric, EOMs intact Wearing a mask No cervical or supraclavicular adenopathy Lungs no rales or rhonchi Heart regular rate and rhythm Abd soft, nontender, positive bowel sounds Neuro: nonfocal, well oriented, appropriate affect Breasts: Deferred}   LAB RESULTS:  CMP     Component Value Date/Time   NA 140 03/28/2021 0800   K 4.2 03/28/2021 0800   CL 111 03/28/2021 0800   CO2 18 (L) 03/28/2021 0800   GLUCOSE 141 (H) 03/28/2021 0800   BUN 60 (H) 03/28/2021 0800   CREATININE 2.19 (H) 03/28/2021 0800   CREATININE 1.56 (H) 07/31/2020 1400   CALCIUM 11.2 (H) 03/28/2021 0800   CALCIUM 11.2 (H) 02/24/2020 0830   PROT 7.7 03/28/2021 0800   ALBUMIN 3.0 (L) 03/28/2021 0800   AST 108 (H) 03/28/2021 0800   AST 134 (H) 07/31/2020 1400   ALT 69 (H) 03/28/2021 0800   ALT 81 (H) 07/31/2020 1400   ALKPHOS 157 (H) 03/28/2021 0800   BILITOT 0.5 03/28/2021 0800   BILITOT 0.9 07/31/2020 1400   GFRNONAA 27 (L) 03/28/2021 0800   GFRNONAA 39 (L) 07/31/2020 1400   GFRAA 41 (L) 08/30/2020 0808   GFRAA 45 (L) 07/31/2020 1400    Lab Results  Component Value Date   TOTALPROTELP 7.0 02/25/2019     Lab Results  Component Value Date   KPAFRELGTCHN 76.7 (H) 02/25/2019   LAMBDASER 27.2 (H) 02/25/2019   KAPLAMBRATIO 2.82 (H) 02/25/2019    Lab Results  Component Value Date   WBC 5.9 03/28/2021   NEUTROABS 4.0 03/28/2021   HGB 8.1 (L) 03/28/2021   HCT 25.0 (L) 03/28/2021   MCV 88.7 03/28/2021   PLT 107 (L) 03/28/2021   No results found for: LABCA2  No components found for: GUYQIH474  No results for input(s): INR in the  last 168 hours.  No results found for: LABCA2  No results found for: QVZ563  No results found for: OVF643  No results found for: PIR518  Lab Results  Component Value Date   CA2729 38.7 (H) 02/25/2019    No components found for: HGQUANT  No results found for: CEA1 / No results found for: CEA1   No results found for: AFPTUMOR  No results found for: CHROMOGRNA  No results found for: HGBA, HGBA2QUANT, HGBFQUANT, HGBSQUAN (Hemoglobinopathy evaluation)   No results found for: LDH  No results found for: IRON, TIBC, IRONPCTSAT (Iron and TIBC)  Lab Results  Component Value Date   FERRITIN 98 12/27/2020    Urinalysis    Component Value Date/Time   COLORURINE YELLOW 08/18/2019 [place] 08/18/2019 1420   LABSPEC 1.016 08/18/2019 1420   PHURINE 5.0 08/18/2019 [place] 08/18/2019 [place] 08/18/2019 [place] 08/18/2019 1420   KETONESUR NEGATIVE 08/18/2019 1420   PROTEINUR NEGATIVE 08/18/2019 1420   UROBILINOGEN 0.2 11/29/2011 1656   NITRITE NEGATIVE 08/18/2019 1420   LEUKOCYTESUR SMALL (A) 08/18/2019 1420    STUDIES: No results found.   ELIGIBLE FOR AVAILABLE RESEARCH PROTOCOL: no   ASSESSMENT: 50 y.o. [place], [place] woman  (1) status post left lumpectomy in 2000 for a (?) T2N0 breast cancer,  (a) status post adjuvant chemotherapy  (b) status post adjuvant radiation  (c) did not receive antiestrogens  (2) genetics testing May 18, 2018 through the common Hereditary Cancers Panel + Myelodysplastic Syndrome/Leukemia Panel found no deleterious mutations in APC, ATM, AXIN2, BARD1, BLM, BMPR1A, BRCA1, BRCA2, BRIP1, CDH1, CDK4, CDKN2A (p14ARF), CDKN2A (p16INK4a), CEBPA,  CHEK2, CTNNA1, DICER1, EPCAM*, GATA2, GREM1*, HRAS, KIT, MEN1, MLH1, MSH2, MSH3, MSH6, MUTYH, NBN, NF1, PALB2, PDGFRA, PMS2, POLD1, POLE, PTEN, RAD50, RAD51C, RAD51D, RUNX1, SDHB, SDHC, SDHD, SMAD4, SMARCA4, STK11, TERC, TERT, TP53, TSC1,  TSC2, VHL The following genes were evaluated for sequence changes only: HOXB13*, NTHL1*, SDHA  (a)  3 variants of uncertain significance were identified in the genes BARD1 c.764A>G (p.Asn255Ser), BRIP1 c.2563C>T (p.Arg855Cys), and PALB2 c.3103A>T (p.Ile1035Phe).   (3) status post right breast biopsy 03/16/2018 for a clinical mT2-3 N2, anatomic stage III invasive ductal carcinoma, grade 2, triple positive, with an MIB-1 of 80%.  (a) bone scan and chest CT scan negative for metastases except for a T6 lytic lesion of concern  (b) thoracic spine MRI was ordered May 2019 but never performed.  (4) neoadjuvant chemotherapy consisting of carboplatin, docetaxel, trastuzumab and Pertuzumab every 3 weeks x 6 starting 04/16/2018  (a) pertuzumab held beginning with cycle 2 because of diarrhea  (5) trastuzumab was to be continued Q4w indefinitely, discontinued after 10/21/2019 dose with progression  (a) echocardiogram on 06/29/2018 showed an ejection fraction in the 65-70% range  (b) echocardiogram 11/19/2018 showed an ejection fraction in the 60-65% range  (c) echo 04/07/2019 shows an ejection fraction in the 55-60% range  (d) echo 07/15/2019 shows EF of 60-65%--for additional echoes see under #12 below  (6) right mastectomy and sentinel lymph node sampling on 08/26/2018 found a residual 2.5cm invasive ductal carcinoma, with 2 of 5 sentinel lymph nodes positive, ypT2, ypN1a; margins were clear  (a) repeat prognostic panel confirms tumor still triple positive (HER2 3+)  (7) adjuvant radiation 09/30/2018 - 11/16/2018 Site/dose:   The patient initially received a dose of 50.4 Gy in 28 fractions to the right chest wall and supraclavicular region. This was delivered using a 3-D conformal, 4 field technique. The patient then received a boost to the mastectomy scar. This delivered an additional 10 Gy in 5 fractions using an en face electron field. The total dose was 60.4   (8) anastrozole started 12/03/2018  (a)  [place] on 10/01/2018 was 64.3, and estradiol 7.0, consistent with menopause  (b) referral to pelvic floor rehabilitation 12/03/2018  (c) [place] on 03/16/2020 was 80.8 with estradiol less than 2.5   METASTATIC DISEASE: March 2020 (9) CT of neck and CT angiography of the chest 02/04/2019 finds the T6 lytic lesion noted May 2019 (see #3 above) is now sclerotic; a sclerotic T3 lesion is stable; there is a new lytic lesion in the manubrium  (a) PET scan 03/12/2019 shows manubrium, T3 and T5 metastases, no visceral disease  (b) biopsy of manubrial lesion planned but not done secondary to pandemic  (c) Bone scan 09/01/19 shows ? progression of disease in spine; MRI 09/02/19 shows early metastases in T10 (new), T3 and T5 stable; CT chest 09/01/19 shows 61m pulmonary nodules that are new, but nonspecific and warrant future follow up  (d) PET 10/26/2019 shows no lung or liver lesions; new mediastinal adenopathy, bone lesions-- T-DM1 started  (e) PET scan 03/08/2020 shows resolution of the mediastinal adenopathy and hypermetabolic bone lesions, questionable subcutaneous nodule along the left lateral shoulder  (f) for additional staging studies see under "12" below  (10) SRS to spine, palliative treatment to sternum, from 05/13/2019 through 05/26/2019:  1. Chest_sternum // 40 Gy in 10 fractions  2. Thoracic Spine (T3) // 18 Gy in 1 fraction   3. Thoracic Spine (T5) // 18 Gy in 1 fraction  (11) denosumab/Xgeva started 09/08/2019, currently given every 6  weeks  (12) T-DM1 started 01/15/20201  (a) echo 10/26/2019 shows stable EF at 55-60%  (b) echo 03/08/2020 shows an ejection fraction in the 70-75% range  (c) echo 07/12/2020 shows an ejection fraction in the 65 to 70% range.  (d) PET scan 03/08/2020 shows no active disease; a 0.9 cm subcutaneous nodule in the left shoulder area seen on the PET was not identifiable by exam 03/16/2020  (e) PET 08/24/2020 shows stable disease except for a new 0.6 cm lymph node with an  SUV of 10.1  (f) bronchoscopy 09/05/2020 with endobronchial ultrasound found no detectable mass in the region noted on the September PET scan  (g) T-DM1 discontinued and trastuzumab resumed 11/29/2020  (h) PET scan 02/26/2021 shows mild nodular increase: TDM-1resumed 03/07/2021   PLAN: [name] had many questions regarding her PET scan results and we went over that.  I also gave her a written copy.  Basically we see definite although marginal evidence of disease progression.  That is why were going back to T-DM1, which she tolerated generally well before and which was effective  We have the option of [place] to follow back on if the current treatment does not work.  She will need a repeat echocardiogram in June.  Her kidney function is poor but stable.  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  She does not seem to be compromised by this but we can add Retacrit if necessary.  I will see her again in 6 weeks.  She knows to call for any other issue that may develop before then  Total encounter time 25 minutes.Sarajane Jews C. Magrinat, MD 04/16/21 9:53 PM Medical Oncology and Hematology [facility] [place], [place] [address] Tel. [phone]    Fax. [phone]   I, Wilburn Mylar, am acting as scribe for Dr. Virgie Dad. Magrinat.  I, Lurline Del MD, have reviewed the above documentation for accuracy and completeness, and I agree with the above.   *Total Encounter Time as defined by the Centers for Medicare and Medicaid Services includes, in addition to the face-to-face time of a patient visit (documented in the note above) non-face-to-face time: obtaining and reviewing outside history, ordering and reviewing medications, tests or procedures, care coordination (communications with other health care professionals or caregivers) and documentation in the medical record.

## 2021-04-17 ENCOUNTER — Ambulatory Visit: Payer: BC Managed Care – PPO | Admitting: Oncology

## 2021-04-17 ENCOUNTER — Other Ambulatory Visit: Payer: BC Managed Care – PPO

## 2021-04-17 ENCOUNTER — Inpatient Hospital Stay: Payer: BC Managed Care – PPO

## 2021-04-18 ENCOUNTER — Telehealth: Payer: Self-pay | Admitting: Oncology

## 2021-04-18 NOTE — Telephone Encounter (Signed)
R/s appts per 5/17 sch msg. Called pt, no answer. Left msg with appts date and times.

## 2021-04-26 ENCOUNTER — Inpatient Hospital Stay: Payer: BC Managed Care – PPO | Admitting: Adult Health

## 2021-04-26 ENCOUNTER — Inpatient Hospital Stay: Payer: BC Managed Care – PPO

## 2021-05-05 ENCOUNTER — Other Ambulatory Visit: Payer: Self-pay | Admitting: Oncology

## 2021-05-05 DIAGNOSIS — C50411 Malignant neoplasm of upper-outer quadrant of right female breast: Secondary | ICD-10-CM

## 2021-05-05 DIAGNOSIS — R1114 Bilious vomiting: Secondary | ICD-10-CM

## 2021-05-05 DIAGNOSIS — Z170 Estrogen receptor positive status [ER+]: Secondary | ICD-10-CM

## 2021-05-08 ENCOUNTER — Encounter: Payer: Self-pay | Admitting: Oncology

## 2021-05-08 ENCOUNTER — Other Ambulatory Visit: Payer: Self-pay | Admitting: *Deleted

## 2021-05-08 DIAGNOSIS — C7951 Secondary malignant neoplasm of bone: Secondary | ICD-10-CM

## 2021-05-09 ENCOUNTER — Telehealth: Payer: Self-pay | Admitting: *Deleted

## 2021-05-09 ENCOUNTER — Other Ambulatory Visit: Payer: Self-pay

## 2021-05-09 ENCOUNTER — Inpatient Hospital Stay: Payer: BC Managed Care – PPO | Attending: Oncology

## 2021-05-09 ENCOUNTER — Inpatient Hospital Stay: Payer: BC Managed Care – PPO

## 2021-05-09 ENCOUNTER — Other Ambulatory Visit: Payer: Self-pay | Admitting: *Deleted

## 2021-05-09 ENCOUNTER — Other Ambulatory Visit: Payer: Self-pay | Admitting: Hematology and Oncology

## 2021-05-09 ENCOUNTER — Other Ambulatory Visit: Payer: BC Managed Care – PPO

## 2021-05-09 VITALS — BP 122/68 | HR 74 | Temp 97.8°F | Resp 16 | Wt 179.0 lb

## 2021-05-09 DIAGNOSIS — D649 Anemia, unspecified: Secondary | ICD-10-CM

## 2021-05-09 DIAGNOSIS — N184 Chronic kidney disease, stage 4 (severe): Secondary | ICD-10-CM | POA: Insufficient documentation

## 2021-05-09 DIAGNOSIS — Z170 Estrogen receptor positive status [ER+]: Secondary | ICD-10-CM | POA: Insufficient documentation

## 2021-05-09 DIAGNOSIS — Z5112 Encounter for antineoplastic immunotherapy: Secondary | ICD-10-CM | POA: Insufficient documentation

## 2021-05-09 DIAGNOSIS — Z79899 Other long term (current) drug therapy: Secondary | ICD-10-CM | POA: Insufficient documentation

## 2021-05-09 DIAGNOSIS — Z95828 Presence of other vascular implants and grafts: Secondary | ICD-10-CM

## 2021-05-09 DIAGNOSIS — C7951 Secondary malignant neoplasm of bone: Secondary | ICD-10-CM | POA: Insufficient documentation

## 2021-05-09 DIAGNOSIS — Z7189 Other specified counseling: Secondary | ICD-10-CM

## 2021-05-09 DIAGNOSIS — D631 Anemia in chronic kidney disease: Secondary | ICD-10-CM

## 2021-05-09 DIAGNOSIS — C50411 Malignant neoplasm of upper-outer quadrant of right female breast: Secondary | ICD-10-CM | POA: Insufficient documentation

## 2021-05-09 LAB — CBC WITH DIFFERENTIAL (CANCER CENTER ONLY)
Abs Immature Granulocytes: 0.02 10*3/uL (ref 0.00–0.07)
Basophils Absolute: 0 10*3/uL (ref 0.0–0.1)
Basophils Relative: 1 %
Eosinophils Absolute: 0.1 10*3/uL (ref 0.0–0.5)
Eosinophils Relative: 2 %
HCT: 20.3 % — ABNORMAL LOW (ref 36.0–46.0)
Hemoglobin: 6.5 g/dL — CL (ref 12.0–15.0)
Immature Granulocytes: 0 %
Lymphocytes Relative: 21 %
Lymphs Abs: 1.2 10*3/uL (ref 0.7–4.0)
MCH: 28 pg (ref 26.0–34.0)
MCHC: 32 g/dL (ref 30.0–36.0)
MCV: 87.5 fL (ref 80.0–100.0)
Monocytes Absolute: 0.7 10*3/uL (ref 0.1–1.0)
Monocytes Relative: 13 %
Neutro Abs: 3.4 10*3/uL (ref 1.7–7.7)
Neutrophils Relative %: 63 %
Platelet Count: 105 10*3/uL — ABNORMAL LOW (ref 150–400)
RBC: 2.32 MIL/uL — ABNORMAL LOW (ref 3.87–5.11)
RDW: 17.4 % — ABNORMAL HIGH (ref 11.5–15.5)
WBC Count: 5.4 10*3/uL (ref 4.0–10.5)
nRBC: 0 % (ref 0.0–0.2)

## 2021-05-09 LAB — CMP (CANCER CENTER ONLY)
ALT: 58 U/L — ABNORMAL HIGH (ref 0–44)
AST: 89 U/L — ABNORMAL HIGH (ref 15–41)
Albumin: 2.8 g/dL — ABNORMAL LOW (ref 3.5–5.0)
Alkaline Phosphatase: 136 U/L — ABNORMAL HIGH (ref 38–126)
Anion gap: 10 (ref 5–15)
BUN: 43 mg/dL — ABNORMAL HIGH (ref 6–20)
CO2: 14 mmol/L — ABNORMAL LOW (ref 22–32)
Calcium: 9.3 mg/dL (ref 8.9–10.3)
Chloride: 113 mmol/L — ABNORMAL HIGH (ref 98–111)
Creatinine: 1.98 mg/dL — ABNORMAL HIGH (ref 0.44–1.00)
GFR, Estimated: 30 mL/min — ABNORMAL LOW (ref >60)
Glucose, Bld: 118 mg/dL — ABNORMAL HIGH (ref 70–99)
Potassium: 4.3 mmol/L (ref 3.5–5.1)
Sodium: 137 mmol/L (ref 135–145)
Total Bilirubin: 0.6 mg/dL (ref 0.3–1.2)
Total Protein: 7.4 g/dL (ref 6.5–8.1)

## 2021-05-09 LAB — PREPARE RBC (CROSSMATCH)

## 2021-05-09 LAB — ABO/RH: ABO/RH(D): O POS

## 2021-05-09 MED ORDER — HEPARIN SOD (PORK) LOCK FLUSH 100 UNIT/ML IV SOLN
500.0000 [IU] | Freq: Once | INTRAVENOUS | Status: AC | PRN
  Administered 2021-05-09: 500 [IU]
  Filled 2021-05-09: qty 5

## 2021-05-09 MED ORDER — DIPHENHYDRAMINE HCL 25 MG PO CAPS
25.0000 mg | ORAL_CAPSULE | Freq: Once | ORAL | Status: AC
  Administered 2021-05-09: 25 mg via ORAL

## 2021-05-09 MED ORDER — DENOSUMAB 120 MG/1.7ML ~~LOC~~ SOLN: 120.0000 mg | Freq: Once | SUBCUTANEOUS | Status: DC

## 2021-05-09 MED ORDER — ACETAMINOPHEN 325 MG PO TABS
650.0000 mg | ORAL_TABLET | Freq: Once | ORAL | Status: AC
  Administered 2021-05-09: 650 mg via ORAL

## 2021-05-09 MED ORDER — SODIUM CHLORIDE 0.9% FLUSH
10.0000 mL | INTRAVENOUS | Status: DC | PRN
  Administered 2021-05-09: 10 mL via INTRAVENOUS
  Filled 2021-05-09: qty 10

## 2021-05-09 MED ORDER — ZOLEDRONIC ACID 4 MG/5ML IV CONC
3.3000 mg | Freq: Once | INTRAVENOUS | Status: AC
  Administered 2021-05-09: 3.3 mg via INTRAVENOUS
  Filled 2021-05-09: qty 4.13

## 2021-05-09 MED ORDER — SODIUM CHLORIDE 0.9% FLUSH
10.0000 mL | INTRAVENOUS | Status: DC | PRN
Start: 2021-05-09 — End: 2021-05-09
  Administered 2021-05-09: 10 mL
  Filled 2021-05-09: qty 10

## 2021-05-09 MED ORDER — DIPHENHYDRAMINE HCL 25 MG PO CAPS
ORAL_CAPSULE | ORAL | Status: AC
  Filled 2021-05-09: qty 1

## 2021-05-09 MED ORDER — SODIUM CHLORIDE 0.9 % IV SOLN
3.6000 mg/kg | Freq: Once | INTRAVENOUS | Status: AC
  Administered 2021-05-09: 300 mg via INTRAVENOUS
  Filled 2021-05-09: qty 15

## 2021-05-09 MED ORDER — ACETAMINOPHEN 325 MG PO TABS
ORAL_TABLET | ORAL | Status: AC
  Filled 2021-05-09: qty 2

## 2021-05-09 MED ORDER — EPOETIN ALFA-EPBX 10000 UNIT/ML IJ SOLN
20000.0000 [IU] | Freq: Once | INTRAMUSCULAR | Status: DC
  Administered 2021-05-09: 20000 [IU] via SUBCUTANEOUS

## 2021-05-09 MED ORDER — SODIUM CHLORIDE 0.9 % IV SOLN
Freq: Once | INTRAVENOUS | Status: AC
  Filled 2021-05-09: qty 250

## 2021-05-09 MED ORDER — SODIUM CHLORIDE 0.9% IV SOLUTION
250.0000 mL | Freq: Once | INTRAVENOUS | Status: AC
  Administered 2021-05-09: 250 mL via INTRAVENOUS
  Filled 2021-05-09: qty 250

## 2021-05-09 NOTE — Telephone Encounter (Signed)
Per heme of 6.5 and review of lab per Dr Lindi Adie- ok to proceed with treatment today with blood transfusion to be scheduled.  Informed treatment room nurse of above.

## 2021-05-09 NOTE — Patient Instructions (Addendum)
[facility]  Discharge Instructions: Thank you for choosing [facility] to provide your oncology and hematology care.   If you have a lab appointment with the [facility], please go directly to the [facility] and check in at the registration area.   Wear comfortable clothing and clothing appropriate for easy access to any Portacath or PICC line.   We strive to give you quality time with your provider. You may need to reschedule your appointment if you arrive late (15 or more minutes).  Arriving late affects you and other patients whose appointments are after yours.  Also, if you miss three or more appointments without notifying the office, you may be dismissed from the clinic at the provider's discretion.      For prescription refill requests, have your pharmacy contact our office and allow 72 hours for refills to be completed.    Today you received the following chemotherapy and/or immunotherapy agents: Kadcyla.      To help prevent nausea and vomiting after your treatment, we encourage you to take your nausea medication as directed.  BELOW ARE SYMPTOMS THAT SHOULD BE REPORTED IMMEDIATELY: . *FEVER GREATER THAN 100.4 F (38 C) OR HIGHER . *CHILLS OR SWEATING . *NAUSEA AND VOMITING THAT IS NOT CONTROLLED WITH YOUR NAUSEA MEDICATION . *UNUSUAL SHORTNESS OF BREATH . *UNUSUAL BRUISING OR BLEEDING . *URINARY PROBLEMS (pain or burning when urinating, or frequent urination) . *BOWEL PROBLEMS (unusual diarrhea, constipation, pain near the anus) . TENDERNESS IN MOUTH AND THROAT WITH OR WITHOUT PRESENCE OF ULCERS (sore throat, sores in mouth, or a toothache) . UNUSUAL RASH, SWELLING OR PAIN  . UNUSUAL VAGINAL DISCHARGE OR ITCHING   Items with * indicate a potential emergency and should be followed up as soon as possible or go to the Emergency Department if any problems should occur.  Please show the CHEMOTHERAPY ALERT CARD or IMMUNOTHERAPY ALERT  CARD at check-in to the Emergency Department and triage nurse.  Should you have questions after your visit or need to cancel or reschedule your appointment, please contact [facility]  Dept: [phone]  and follow the prompts.  Office hours are 8:00 a.m. to 4:30 p.m. Monday - Friday. Please note that voicemails left after 4:00 p.m. may not be returned until the following business day.  We are closed weekends and major holidays. You have access to a nurse at all times for urgent questions. Please call the main number to the clinic Dept: [phone] and follow the prompts.   For any non-urgent questions, you may also contact your provider using MyChart. We now offer e-Visits for anyone 82 and older to request care online for non-urgent symptoms. For details visit [email or web address].   Also download the MyChart app! Go to the app store, search "MyChart", open the app, select [facility], and log in with your MyChart username and password.  Due to Covid, a mask is required upon entering the hospital/clinic. If you do not have a mask, one will be given to you upon arrival. For doctor visits, patients may have 1 support person aged 106 or older with them. For treatment visits, patients cannot have anyone with them due to current Covid guidelines and our immunocompromised population.   Zoledronic Acid Injection (Hypercalcemia, Oncology) What is this medicine? ZOLEDRONIC ACID (ZOE le dron ik AS id) slows calcium loss from bones. It high calcium levels in the blood from some kinds of cancer. It may be used in  other people at risk for bone loss. This medicine may be used for other purposes; ask your health care provider or pharmacist if you have questions. COMMON BRAND NAME(S): Zometa What should I tell my health care provider before I take this medicine? They need to know if you have any of these conditions:  cancer  dehydration  dental disease  kidney  disease  liver disease  low levels of calcium in the blood  lung or breathing disease (asthma)  receiving steroids like dexamethasone or prednisone  an unusual or allergic reaction to zoledronic acid, other medicines, foods, dyes, or preservatives  pregnant or trying to get pregnant  breast-feeding How should I use this medicine? This drug is injected into a vein. It is given by a health care provider in a hospital or clinic setting. Talk to your health care provider about the use of this drug in children. Special care may be needed. Overdosage: If you think you have taken too much of this medicine contact a poison control center or emergency room at once. NOTE: This medicine is only for you. Do not share this medicine with others. What if I miss a dose? Keep appointments for follow-up doses. It is important not to miss your dose. Call your health care provider if you are unable to keep an appointment. What may interact with this medicine?  certain antibiotics given by injection  NSAIDs, medicines for pain and inflammation, like ibuprofen or naproxen  some diuretics like bumetanide, furosemide  teriparatide  thalidomide This list may not describe all possible interactions. Give your health care provider a list of all the medicines, herbs, non-prescription drugs, or dietary supplements you use. Also tell them if you smoke, drink alcohol, or use illegal drugs. Some items may interact with your medicine. What should I watch for while using this medicine? Visit your health care provider for regular checks on your progress. It may be some time before you see the benefit from this drug. Some people who take this drug have severe bone, joint, or muscle pain. This drug may also increase your risk for jaw problems or a broken thigh bone. Tell your health care provider right away if you have severe pain in your jaw, bones, joints, or muscles. Tell you health care provider if you have any  pain that does not go away or that gets worse. Tell your dentist and dental surgeon that you are taking this drug. You should not have major dental surgery while on this drug. See your dentist to have a dental exam and fix any dental problems before starting this drug. Take good care of your teeth while on this drug. Make sure you see your dentist for regular follow-up appointments. You should make sure you get enough calcium and vitamin D while you are taking this drug. Discuss the foods you eat and the vitamins you take with your health care provider. Check with your health care provider if you have severe diarrhea, nausea, and vomiting, or if you sweat a lot. The loss of too much body fluid may make it dangerous for you to take this drug. You may need blood work done while you are taking this drug. Do not become pregnant while taking this drug. Women should inform their health care provider if they wish to become pregnant or think they might be pregnant. There is potential for serious harm to an unborn child. Talk to your health care provider for more information. What side effects may I notice from  receiving this medicine? Side effects that you should report to your doctor or health care provider as soon as possible:  allergic reactions (skin rash, itching or hives; swelling of the face, lips, or tongue)  bone pain  infection (fever, chills, cough, sore throat, pain or trouble passing urine)  jaw pain, especially after dental work  joint pain  kidney injury (trouble passing urine or change in the amount of urine)  low blood pressure (dizziness; feeling faint or lightheaded, falls; unusually weak or tired)  low calcium levels (fast heartbeat; muscle cramps or pain; pain, tingling, or numbness in the hands or feet; seizures)  low magnesium levels (fast, irregular heartbeat; muscle cramp or pain; muscle weakness; tremors; seizures)  low red blood cell counts (trouble breathing; feeling  faint; lightheaded, falls; unusually weak or tired)  muscle pain  redness, blistering, peeling, or loosening of the skin, including inside the mouth  severe diarrhea  swelling of the ankles, feet, hands  trouble breathing Side effects that usually do not require medical attention (report to your doctor or health care provider if they continue or are bothersome):  anxious  constipation  coughing  depressed mood  eye irritation, itching, or pain  fever  general ill feeling or flu-like symptoms  nausea  pain, redness, or irritation at site where injected  trouble sleeping This list may not describe all possible side effects. Call your doctor for medical advice about side effects. You may report side effects to FDA at 1-800-FDA-1088. Where should I keep my medicine? This drug is given in a hospital or clinic. It will not be stored at home. NOTE: This sheet is a summary. It may not cover all possible information. If you have questions about this medicine, talk to your doctor, pharmacist, or health care provider.  2021 Elsevier/Gold Standard (2019-09-02 09:13:00)  Blood Transfusion, Adult, Care After This sheet gives you information about how to care for yourself after your procedure. Your doctor may also give you more specific instructions. If you have problems or questions, contact your doctor. What can I expect after the procedure? After the procedure, it is common to have:  Bruising and soreness at the IV site.  A fever or chills on the day of the procedure. This may be your body's response to the new blood cells received.  A headache. Follow these instructions at home: Insertion site care  Follow instructions from your doctor about how to take care of your insertion site. This is where an IV tube was put into your vein. Make sure you: ? Wash your hands with soap and water before and after you change your bandage (dressing). If you cannot use soap and water, use  hand sanitizer. ? Change your bandage as told by your doctor.  Check your insertion site every day for signs of infection. Check for: ? Redness, swelling, or pain. ? Bleeding from the site. ? Warmth. ? Pus or a bad smell.      General instructions  Take over-the-counter and prescription medicines only as told by your doctor.  Rest as told by your doctor.  Go back to your normal activities as told by your doctor.  Keep all follow-up visits as told by your doctor. This is important. Contact a doctor if:  You have itching or red, swollen areas of skin (hives).  You feel worried or nervous (anxious).  You feel weak after doing your normal activities.  You have redness, swelling, warmth, or pain around the insertion site.  You  have blood coming from the insertion site, and the blood does not stop with pressure.  You have pus or a bad smell coming from the insertion site. Get help right away if:  You have signs of a serious reaction. This may be coming from an allergy or the body's defense system (immune system). Signs include: ? Trouble breathing or shortness of breath. ? Swelling of the face or feeling warm (flushed). ? Fever or chills. ? Head, chest, or back pain. ? Dark pee (urine) or blood in the pee. ? Widespread rash. ? Fast heartbeat. ? Feeling dizzy or light-headed. You may receive your blood transfusion in an outpatient setting. If so, you will be told whom to contact to report any reactions. These symptoms may be an emergency. Do not wait to see if the symptoms will go away. Get medical help right away. Call your local emergency services (911 in the [place] Do not drive yourself to the hospital. Summary  Bruising and soreness at the IV site are common.  Check your insertion site every day for signs of infection.  Rest as told by your doctor. Go back to your normal activities as told by your doctor.  Get help right away if you have signs of a serious  reaction. This information is not intended to replace advice given to you by your health care provider. Make sure you discuss any questions you have with your health care provider. Document Revised: 05/13/2019 Document Reviewed: 05/13/2019 Elsevier Patient Education  [employer].

## 2021-05-09 NOTE — Progress Notes (Signed)
Pts insur has denied Xgeva & now prefers Zometa. In Dr. Virgie Dad absence, I have discussed w/ Dr. Lindi Adie and he'll enter plan for Zometa w/ dose reduction (CrCl = 44 mL/min). Erline Hau, RN has discussed this w/ pt in infusion and pt is aware of switch.  Kennith Center, Pharm.D., CPP 05/09/2021@12 :48 PM

## 2021-05-09 NOTE — Progress Notes (Signed)
Per Lindi Adie, okay to treat with lab results from today.

## 2021-05-10 LAB — TYPE AND SCREEN
ABO/RH(D): O POS
Antibody Screen: NEGATIVE
Unit division: 0
Unit division: 0

## 2021-05-10 LAB — BPAM RBC
Blood Product Expiration Date: 202207102359
Blood Product Expiration Date: 202207102359
ISSUE DATE / TIME: 202206081226
ISSUE DATE / TIME: 202206081226
Unit Type and Rh: 5100
Unit Type and Rh: 5100

## 2021-05-30 ENCOUNTER — Observation Stay (HOSPITAL_COMMUNITY): Payer: BC Managed Care – PPO

## 2021-05-30 ENCOUNTER — Inpatient Hospital Stay: Payer: BC Managed Care – PPO

## 2021-05-30 ENCOUNTER — Other Ambulatory Visit: Payer: Self-pay | Admitting: *Deleted

## 2021-05-30 ENCOUNTER — Other Ambulatory Visit: Payer: Self-pay

## 2021-05-30 ENCOUNTER — Inpatient Hospital Stay (HOSPITAL_COMMUNITY)
Admission: EM | Admit: 2021-05-30 | Discharge: 2021-06-05 | DRG: 871 | Disposition: A | Discharge status: Home / Self Care | Payer: BC Managed Care – PPO | Source: Ambulatory Visit | Attending: Family Medicine | Admitting: Family Medicine

## 2021-05-30 ENCOUNTER — Emergency Department (HOSPITAL_COMMUNITY): Payer: BC Managed Care – PPO

## 2021-05-30 ENCOUNTER — Other Ambulatory Visit: Payer: BC Managed Care – PPO

## 2021-05-30 ENCOUNTER — Encounter (HOSPITAL_COMMUNITY): Payer: Self-pay

## 2021-05-30 DIAGNOSIS — D631 Anemia in chronic kidney disease: Secondary | ICD-10-CM | POA: Diagnosis present

## 2021-05-30 DIAGNOSIS — I129 Hypertensive chronic kidney disease with stage 1 through stage 4 chronic kidney disease, or unspecified chronic kidney disease: Secondary | ICD-10-CM | POA: Diagnosis not present

## 2021-05-30 DIAGNOSIS — Z9221 Personal history of antineoplastic chemotherapy: Secondary | ICD-10-CM | POA: Diagnosis not present

## 2021-05-30 DIAGNOSIS — K859 Acute pancreatitis without necrosis or infection, unspecified: Secondary | ICD-10-CM | POA: Diagnosis present

## 2021-05-30 DIAGNOSIS — T451X5A Adverse effect of antineoplastic and immunosuppressive drugs, initial encounter: Secondary | ICD-10-CM | POA: Diagnosis present

## 2021-05-30 DIAGNOSIS — R748 Abnormal levels of other serum enzymes: Secondary | ICD-10-CM | POA: Diagnosis present

## 2021-05-30 DIAGNOSIS — Z807 Family history of other malignant neoplasms of lymphoid, hematopoietic and related tissues: Secondary | ICD-10-CM

## 2021-05-30 DIAGNOSIS — Z170 Estrogen receptor positive status [ER+]: Secondary | ICD-10-CM

## 2021-05-30 DIAGNOSIS — Z8249 Family history of ischemic heart disease and other diseases of the circulatory system: Secondary | ICD-10-CM

## 2021-05-30 DIAGNOSIS — Y929 Unspecified place or not applicable: Secondary | ICD-10-CM

## 2021-05-30 DIAGNOSIS — K858 Other acute pancreatitis without necrosis or infection: Secondary | ICD-10-CM | POA: Diagnosis not present

## 2021-05-30 DIAGNOSIS — Z6841 Body Mass Index (BMI) 40.0 and over, adult: Secondary | ICD-10-CM

## 2021-05-30 DIAGNOSIS — C50411 Malignant neoplasm of upper-outer quadrant of right female breast: Secondary | ICD-10-CM

## 2021-05-30 DIAGNOSIS — J9811 Atelectasis: Secondary | ICD-10-CM | POA: Diagnosis not present

## 2021-05-30 DIAGNOSIS — D630 Anemia in neoplastic disease: Secondary | ICD-10-CM | POA: Diagnosis not present

## 2021-05-30 DIAGNOSIS — N184 Chronic kidney disease, stage 4 (severe): Secondary | ICD-10-CM | POA: Diagnosis present

## 2021-05-30 DIAGNOSIS — Z801 Family history of malignant neoplasm of trachea, bronchus and lung: Secondary | ICD-10-CM

## 2021-05-30 DIAGNOSIS — E1122 Type 2 diabetes mellitus with diabetic chronic kidney disease: Secondary | ICD-10-CM

## 2021-05-30 DIAGNOSIS — F419 Anxiety disorder, unspecified: Secondary | ICD-10-CM | POA: Diagnosis present

## 2021-05-30 DIAGNOSIS — Z833 Family history of diabetes mellitus: Secondary | ICD-10-CM

## 2021-05-30 DIAGNOSIS — Z923 Personal history of irradiation: Secondary | ICD-10-CM | POA: Diagnosis not present

## 2021-05-30 DIAGNOSIS — J189 Pneumonia, unspecified organism: Secondary | ICD-10-CM | POA: Diagnosis not present

## 2021-05-30 DIAGNOSIS — E872 Acidosis: Secondary | ICD-10-CM | POA: Diagnosis not present

## 2021-05-30 DIAGNOSIS — D6481 Anemia due to antineoplastic chemotherapy: Secondary | ICD-10-CM | POA: Diagnosis present

## 2021-05-30 DIAGNOSIS — R6520 Severe sepsis without septic shock: Secondary | ICD-10-CM | POA: Diagnosis not present

## 2021-05-30 DIAGNOSIS — E860 Dehydration: Secondary | ICD-10-CM | POA: Diagnosis present

## 2021-05-30 DIAGNOSIS — R059 Cough, unspecified: Secondary | ICD-10-CM | POA: Diagnosis not present

## 2021-05-30 DIAGNOSIS — R042 Hemoptysis: Secondary | ICD-10-CM | POA: Diagnosis not present

## 2021-05-30 DIAGNOSIS — I10 Essential (primary) hypertension: Secondary | ICD-10-CM | POA: Diagnosis present

## 2021-05-30 DIAGNOSIS — D849 Immunodeficiency, unspecified: Secondary | ICD-10-CM | POA: Diagnosis present

## 2021-05-30 DIAGNOSIS — D6959 Other secondary thrombocytopenia: Secondary | ICD-10-CM | POA: Diagnosis present

## 2021-05-30 DIAGNOSIS — Z8673 Personal history of transient ischemic attack (TIA), and cerebral infarction without residual deficits: Secondary | ICD-10-CM

## 2021-05-30 DIAGNOSIS — R0602 Shortness of breath: Secondary | ICD-10-CM | POA: Diagnosis not present

## 2021-05-30 DIAGNOSIS — N179 Acute kidney failure, unspecified: Secondary | ICD-10-CM | POA: Diagnosis not present

## 2021-05-30 DIAGNOSIS — A419 Sepsis, unspecified organism: Secondary | ICD-10-CM | POA: Diagnosis not present

## 2021-05-30 DIAGNOSIS — Z820 Family history of epilepsy and other diseases of the nervous system: Secondary | ICD-10-CM

## 2021-05-30 DIAGNOSIS — R509 Fever, unspecified: Secondary | ICD-10-CM

## 2021-05-30 DIAGNOSIS — Z20822 Contact with and (suspected) exposure to covid-19: Secondary | ICD-10-CM | POA: Diagnosis not present

## 2021-05-30 DIAGNOSIS — E875 Hyperkalemia: Secondary | ICD-10-CM | POA: Diagnosis present

## 2021-05-30 DIAGNOSIS — Z823 Family history of stroke: Secondary | ICD-10-CM

## 2021-05-30 DIAGNOSIS — D649 Anemia, unspecified: Secondary | ICD-10-CM | POA: Diagnosis not present

## 2021-05-30 DIAGNOSIS — C50919 Malignant neoplasm of unspecified site of unspecified female breast: Secondary | ICD-10-CM | POA: Diagnosis not present

## 2021-05-30 DIAGNOSIS — C7951 Secondary malignant neoplasm of bone: Secondary | ICD-10-CM | POA: Diagnosis not present

## 2021-05-30 DIAGNOSIS — Z832 Family history of diseases of the blood and blood-forming organs and certain disorders involving the immune mechanism: Secondary | ICD-10-CM

## 2021-05-30 DIAGNOSIS — C50911 Malignant neoplasm of unspecified site of right female breast: Secondary | ICD-10-CM | POA: Diagnosis not present

## 2021-05-30 DIAGNOSIS — N170 Acute kidney failure with tubular necrosis: Secondary | ICD-10-CM | POA: Diagnosis not present

## 2021-05-30 LAB — URINALYSIS, ROUTINE W REFLEX MICROSCOPIC
Bilirubin Urine: NEGATIVE
Glucose, UA: NEGATIVE mg/dL
Ketones, ur: NEGATIVE mg/dL
Leukocytes,Ua: NEGATIVE
Nitrite: NEGATIVE
Protein, ur: 30 mg/dL — AB
Specific Gravity, Urine: 1.01 (ref 1.005–1.030)
pH: 5 (ref 5.0–8.0)

## 2021-05-30 LAB — CBC WITH DIFFERENTIAL (CANCER CENTER ONLY)
Abs Immature Granulocytes: 0.1 10*3/uL — ABNORMAL HIGH (ref 0.00–0.07)
Basophils Absolute: 0 10*3/uL (ref 0.0–0.1)
Basophils Relative: 0 %
Eosinophils Absolute: 0 10*3/uL (ref 0.0–0.5)
Eosinophils Relative: 0 %
HCT: 22 % — ABNORMAL LOW (ref 36.0–46.0)
Hemoglobin: 7.3 g/dL — ABNORMAL LOW (ref 12.0–15.0)
Immature Granulocytes: 1 %
Lymphocytes Relative: 8 %
Lymphs Abs: 1 10*3/uL (ref 0.7–4.0)
MCH: 28.5 pg (ref 26.0–34.0)
MCHC: 33.2 g/dL (ref 30.0–36.0)
MCV: 85.9 fL (ref 80.0–100.0)
Monocytes Absolute: 1 10*3/uL (ref 0.1–1.0)
Monocytes Relative: 8 %
Neutro Abs: 10.7 10*3/uL — ABNORMAL HIGH (ref 1.7–7.7)
Neutrophils Relative %: 83 %
Platelet Count: 107 10*3/uL — ABNORMAL LOW (ref 150–400)
RBC: 2.56 MIL/uL — ABNORMAL LOW (ref 3.87–5.11)
RDW: 18.6 % — ABNORMAL HIGH (ref 11.5–15.5)
WBC Count: 12.8 10*3/uL — ABNORMAL HIGH (ref 4.0–10.5)
nRBC: 0.2 % (ref 0.0–0.2)

## 2021-05-30 LAB — RESP PANEL BY RT-PCR (FLU A&B, COVID) ARPGX2
Influenza A by PCR: NEGATIVE
Influenza B by PCR: NEGATIVE
SARS Coronavirus 2 by RT PCR: NEGATIVE

## 2021-05-30 LAB — CMP (CANCER CENTER ONLY)
ALT: 56 U/L — ABNORMAL HIGH (ref 0–44)
AST: 96 U/L — ABNORMAL HIGH (ref 15–41)
Albumin: 2.8 g/dL — ABNORMAL LOW (ref 3.5–5.0)
Alkaline Phosphatase: 163 U/L — ABNORMAL HIGH (ref 38–126)
Anion gap: 10 (ref 5–15)
BUN: 65 mg/dL — ABNORMAL HIGH (ref 6–20)
CO2: 13 mmol/L — ABNORMAL LOW (ref 22–32)
Calcium: 11.3 mg/dL — ABNORMAL HIGH (ref 8.9–10.3)
Chloride: 108 mmol/L (ref 98–111)
Creatinine: 2.77 mg/dL — ABNORMAL HIGH (ref 0.44–1.00)
GFR, Estimated: 20 mL/min — ABNORMAL LOW (ref >60)
Glucose, Bld: 137 mg/dL — ABNORMAL HIGH (ref 70–99)
Potassium: 5.3 mmol/L — ABNORMAL HIGH (ref 3.5–5.1)
Sodium: 131 mmol/L — ABNORMAL LOW (ref 135–145)
Total Bilirubin: 1.8 mg/dL — ABNORMAL HIGH (ref 0.3–1.2)
Total Protein: 7.7 g/dL (ref 6.5–8.1)

## 2021-05-30 LAB — LACTIC ACID, PLASMA: Lactic Acid, Venous: 1.4 mmol/L (ref 0.5–1.9)

## 2021-05-30 LAB — HIV ANTIBODY (ROUTINE TESTING W REFLEX): HIV Screen 4th Generation wRfx: NONREACTIVE

## 2021-05-30 LAB — PROTIME-INR
INR: 1.1 (ref 0.8–1.2)
Prothrombin Time: 14.6 seconds (ref 11.4–15.2)

## 2021-05-30 LAB — I-STAT BETA HCG BLOOD, ED (MC, WL, AP ONLY): I-stat hCG, quantitative: <5 m[IU]/mL (ref <5)

## 2021-05-30 LAB — CREATININE, URINE, RANDOM: Creatinine, Urine: 63.43 mg/dL

## 2021-05-30 LAB — APTT: aPTT: 37 seconds — ABNORMAL HIGH (ref 24–36)

## 2021-05-30 LAB — SODIUM, URINE, RANDOM: Sodium, Ur: 73 mmol/L

## 2021-05-30 LAB — PROCALCITONIN: Procalcitonin: 2.28 ng/mL

## 2021-05-30 MED ORDER — VANCOMYCIN HCL 1250 MG/250ML IV SOLN
1250.0000 mg | Freq: Once | INTRAVENOUS | Status: AC
  Administered 2021-05-30: 1250 mg via INTRAVENOUS
  Filled 2021-05-30: qty 250

## 2021-05-30 MED ORDER — ASCORBIC ACID 500 MG PO TABS
1000.0000 mg | ORAL_TABLET | Freq: Every day | ORAL | Status: DC
  Administered 2021-05-31 – 2021-06-05 (×6): 1000 mg via ORAL
  Filled 2021-05-30 (×6): qty 2

## 2021-05-30 MED ORDER — FERROUS SULFATE 325 (65 FE) MG PO TABS
325.0000 mg | ORAL_TABLET | Freq: Every day | ORAL | Status: DC
  Administered 2021-05-31 – 2021-06-05 (×6): 325 mg via ORAL
  Filled 2021-05-30 (×6): qty 1

## 2021-05-30 MED ORDER — ACYCLOVIR 400 MG PO TABS
400.0000 mg | ORAL_TABLET | Freq: Two times a day (BID) | ORAL | Status: DC
  Administered 2021-05-30 – 2021-06-05 (×12): 400 mg via ORAL
  Filled 2021-05-30 (×12): qty 1

## 2021-05-30 MED ORDER — ANASTROZOLE 1 MG PO TABS
1.0000 mg | ORAL_TABLET | Freq: Every day | ORAL | Status: DC
  Administered 2021-05-31 – 2021-06-05 (×6): 1 mg via ORAL
  Filled 2021-05-30 (×6): qty 1

## 2021-05-30 MED ORDER — LACTATED RINGERS IV SOLN: INTRAVENOUS | Status: DC

## 2021-05-30 MED ORDER — SODIUM CHLORIDE 0.9 % IV SOLN
2.0000 g | INTRAVENOUS | Status: DC
  Administered 2021-05-31: 2 g via INTRAVENOUS
  Filled 2021-05-30 (×2): qty 2

## 2021-05-30 MED ORDER — VANCOMYCIN HCL IN DEXTROSE 1-5 GM/200ML-% IV SOLN: 1000.0000 mg | Freq: Once | INTRAVENOUS | Status: DC

## 2021-05-30 MED ORDER — SODIUM CHLORIDE 0.9 % IV SOLN
2.0000 g | Freq: Once | INTRAVENOUS | Status: AC
  Administered 2021-05-30: 2 g via INTRAVENOUS
  Filled 2021-05-30: qty 2

## 2021-05-30 MED ORDER — STERILE WATER FOR INJECTION IV SOLN
INTRAVENOUS | Status: DC
  Filled 2021-05-30: qty 150
  Filled 2021-05-30 (×2): qty 1000
  Filled 2021-05-30: qty 150
  Filled 2021-05-30 (×2): qty 1000

## 2021-05-30 MED ORDER — ADULT MULTIVITAMIN W/MINERALS CH
1.0000 | ORAL_TABLET | Freq: Every day | ORAL | Status: DC
  Administered 2021-05-31 – 2021-06-05 (×6): 1 via ORAL
  Filled 2021-05-30 (×6): qty 1

## 2021-05-30 MED ORDER — SODIUM CHLORIDE 0.9 % IV BOLUS (SEPSIS)
1000.0000 mL | Freq: Once | INTRAVENOUS | Status: AC
  Administered 2021-05-30: 1000 mL via INTRAVENOUS

## 2021-05-30 MED ORDER — HEPARIN SODIUM (PORCINE) 5000 UNIT/ML IJ SOLN
5000.0000 [IU] | Freq: Three times a day (TID) | INTRAMUSCULAR | Status: DC
  Administered 2021-05-30 – 2021-05-31 (×2): 5000 [IU] via SUBCUTANEOUS
  Filled 2021-05-30 (×2): qty 1

## 2021-05-30 MED ORDER — ZINC SULFATE 220 (50 ZN) MG PO CAPS
220.0000 mg | ORAL_CAPSULE | Freq: Every day | ORAL | Status: DC
  Administered 2021-05-31 – 2021-06-05 (×6): 220 mg via ORAL
  Filled 2021-05-30 (×6): qty 1

## 2021-05-30 MED ORDER — METRONIDAZOLE 500 MG/100ML IV SOLN
500.0000 mg | Freq: Once | INTRAVENOUS | Status: AC
  Administered 2021-05-30: 500 mg via INTRAVENOUS
  Filled 2021-05-30: qty 100

## 2021-05-30 MED ORDER — VANCOMYCIN HCL 750 MG/150ML IV SOLN: 750.0000 mg | INTRAVENOUS | Status: DC

## 2021-05-30 MED ORDER — SODIUM CHLORIDE 0.9 % IV BOLUS (SEPSIS)
500.0000 mL | Freq: Once | INTRAVENOUS | Status: AC
  Administered 2021-05-30: 500 mL via INTRAVENOUS

## 2021-05-30 MED ORDER — VENLAFAXINE HCL ER 37.5 MG PO CP24
37.5000 mg | ORAL_CAPSULE | Freq: Every day | ORAL | Status: DC
  Administered 2021-05-31 – 2021-06-05 (×6): 37.5 mg via ORAL
  Filled 2021-05-30 (×6): qty 1

## 2021-05-30 MED ORDER — GUAIFENESIN ER 600 MG PO TB12
600.0000 mg | ORAL_TABLET | Freq: Two times a day (BID) | ORAL | Status: DC
  Administered 2021-05-30 – 2021-06-05 (×12): 600 mg via ORAL
  Filled 2021-05-30 (×12): qty 1

## 2021-05-30 NOTE — ED Notes (Signed)
Pt is ambulatory to restroom without assistance.

## 2021-05-30 NOTE — Progress Notes (Signed)
A consult was received from an ED physician for vancomycin and cefepime per pharmacy dosing.  The patient's profile has been reviewed for ht/wt/allergies/indication/available labs.   A one time order has been placed for vancomycin 1250 mg and cefepime 2 g per MD.  Further antibiotics/pharmacy consults should be ordered by admitting physician if indicated.                       Thank you, Napoleon Form 05/30/2021  11:39 AM

## 2021-05-30 NOTE — Sepsis Progress Note (Signed)
Notified bedside nurse of need to draw lactic acid and blood cultures.  

## 2021-05-30 NOTE — ED Provider Notes (Signed)
[facility] DEPT Provider Note   CSN: [id number] Arrival date & time: 05/30/21  1030     History Chief Complaint  Patient presents with   Fever    [name] [name] is a 50 y.o. female.  HPI     50 year old female with a history of triple positive breast cancer with progression, on T-DM1 (monoclonal ab treatment) every 3 weeks, anemia for which she has been receiving outpatient infusions, presents from the cancer center today with concern for low blood pressure, fever and fatigue.  Fatigue, iron low, went to cancer office today and sent here due to fever  Cough since April, more of a tickle in throat, unchanged, taking decongestant but not helping  No abdominal pain, no nausea or vomiting, no dysuria, sore throat or rash, occasional headache, no known sick contacts. Constant right sided pain for several months where she had previous surgery which is not changed.  Past Medical History:  Diagnosis Date   Anxiety    Breast cancer, left breast ([place]) 2000   Underwent lumpectomy, chemotherapy and radiation   Facial paralysis/Bells palsy    left side   Family history of lung cancer    Family history of lymphoma    Hypertension    Neuropathy    IN FINGERS AND TOES   RECENT INFUSION OF CHEMO   Personal history of breast cancer 05/06/2018   Renal disorder    just after TIA acute kidney injury   TIA (transient ischemic attack) 03/16/2017   [facility]    Patient Active Problem List   Diagnosis Date Noted   Sepsis ([place]) 05/30/2021   CKD (chronic kidney disease) stage 4, GFR 15-29 ml/min ([place]) 05/30/2021   Anemia associated with stage 4 chronic renal failure ([place]) 11/06/2020   Thrombocytopenia ([place]) 09/20/2020   Hilar lymphadenopathy 09/05/2020   Hypercalcemia 02/04/2020   Bone metastases ([place]) 04/22/2019   Lymphedema of left arm 10/07/2018   Goals of care, counseling/discussion 08/06/2018   Morbid obesity with BMI of 40.0-44.9, adult ([place])  07/09/2018   Port-A-Cath in place 05/07/2018   Personal history of breast cancer 05/06/2018   Family history of lymphoma    Family history of lung cancer    Essential hypertension 04/01/2018   Pre-operative cardiovascular examination 04/01/2018   Malignant neoplasm of upper-outer quadrant of right breast in female, estrogen receptor positive ([place]) 03/19/2018   History of TIA (transient ischemic attack) 03/17/2017   Fibroids 08/02/2014    Past Surgical History:  Procedure Laterality Date   BREAST LUMPECTOMY WITH AXILLARY LYMPH NODE BIOPSY Left 2000   biopsy   BREAST SURGERY     partial mastectomy with lymph node removal   ENDOBRONCHIAL ULTRASOUND Bilateral 09/05/2020   Procedure: ENDOBRONCHIAL ULTRASOUND;  Surgeon: Collene Gobble, MD;  Location: [facility];  Service: Cardiopulmonary;  Laterality: Bilateral;   MASTECTOMY Right 2019   & partial mastectomy left breast 2000   MASTECTOMY MODIFIED RADICAL Right 08/26/2018   Procedure: MASTECTOMY MODIFIED RADICAL;  Surgeon: Jovita Kussmaul, MD;  Location: [facility];  Service: General;  Laterality: Right;   MODIFIED MASTECTOMY Right 08/26/2018   PORTACATH PLACEMENT Left 04/08/2018   Procedure: INSERTION PORT-A-CATH;  Surgeon: Jovita Kussmaul, MD;  Location: [facility];  Service: General;  Laterality: Left;   TUBAL LIGATION Bilateral 2004   VIDEO BRONCHOSCOPY N/A 09/05/2020   Procedure: VIDEO BRONCHOSCOPY WITHOUT FLUORO;  Surgeon: Collene Gobble, MD;  Location: [facility];  Service: Cardiopulmonary;  Laterality: N/A;   WISDOM TOOTH EXTRACTION  OB History     Gravida  3   Para  3   Term  2   Preterm  1   AB  0   Living  3      SAB  0   IAB  0   Ectopic  0   Multiple  0   Live Births           Obstetric Comments  Two adopted children Female age 60 and Female age 34.         Family History  Problem Relation Age of Onset   Hypertension Mother    Hypertension Father    Diverticulosis Father    Diabetes Father    Lung  cancer Father 67       hx smoking   Hypertension Maternal Grandmother    CAD Maternal Grandmother    Alzheimer's disease Maternal Grandmother        died at 50   Hypertension Paternal Grandmother    CAD Paternal Grandmother    Stroke Paternal Grandfather    Sickle cell trait Paternal Grandfather    Pneumonia Maternal Aunt        died at a young adult   Lymphoma Paternal Aunt 74    Social History   Tobacco Use   Smoking status: Never   Smokeless tobacco: Never  Vaping Use   Vaping Use: Never used  Substance Use Topics   Alcohol use: No   Drug use: No    Home Medications Prior to Admission medications   Medication Sig Start Date End Date Taking? Authorizing Provider  acyclovir (ZOVIRAX) 400 MG tablet TAKE 1 TABLET BY MOUTH TWICE A DAY Patient taking differently: Take 400 mg by mouth daily. 09/22/20  Yes Magrinat, Virgie Dad, MD  anastrozole (ARIMIDEX) 1 MG tablet Take 1 tablet (1 mg total) by mouth daily. 09/20/20  Yes Magrinat, Virgie Dad, MD  Ascorbic Acid (VITAMIN C) 1000 MG tablet Take 1,000 mg by mouth daily.   Yes [provider]  carvedilol (COREG) 3.125 MG tablet TAKE 1 TABLET (3.125 MG TOTAL) BY MOUTH 2 (TWO) TIMES DAILY WITH A MEAL. 03/01/21  Yes Magrinat, Virgie Dad, MD  Chlorpheniramine-Pseudoeph (PSEUDEPHEDRINE PLUS PO) Take 1 tablet by mouth daily as needed (congestion).   Yes [provider]  cholecalciferol (VITAMIN D3) 25 MCG (1000 UNIT) tablet Take 1,000 Units by mouth daily.   Yes [provider]  Cyanocobalamin (VITAMIN B12) 500 MCG TABS Take 500 mcg by mouth daily.    Yes [provider]  ELDERBERRY PO Take 1 tablet by mouth daily.   Yes [provider]  ferrous sulfate 325 (65 FE) MG tablet Take 1 tablet (325 mg total) by mouth daily. 11/06/16 05/30/21 Yes Daymon Larsen, MD  lidocaine-prilocaine (EMLA) cream Apply 1 application topically as needed. Patient taking differently: Apply 1 application topically daily as  needed (port access). 11/06/20  Yes Magrinat, Virgie Dad, MD  losartan (COZAAR) 50 MG tablet TAKE 1 TABLET BY MOUTH EVERY DAY Patient taking differently: Take 50 mg by mouth daily. 12/18/20  Yes Bensimhon, Shaune Pascal, MD  Multiple Vitamin (MULITIVITAMIN WITH MINERALS) TABS Take 1 tablet by mouth daily.   Yes [provider]  spironolactone-hydrochlorothiazide (ALDACTAZIDE) 25-25 MG tablet TAKE 1 TABLET BY MOUTH EVERY DAY Patient taking differently: Take 1 tablet by mouth daily. 05/31/20  Yes Magrinat, Virgie Dad, MD  traMADol (ULTRAM) 50 MG tablet Take 1-2 tablets (50-100 mg total) by mouth every 6 (six) hours as  needed. Patient taking differently: Take 50-100 mg by mouth every 6 (six) hours as needed for moderate pain. 02/28/21  Yes Magrinat, Virgie Dad, MD  venlafaxine XR (EFFEXOR-XR) 37.5 MG 24 hr capsule TAKE 1 CAPSULE BY MOUTH DAILY WITH BREAKFAST. Patient taking differently: Take 37.5 mg by mouth daily with breakfast. 05/08/21  Yes Magrinat, Virgie Dad, MD  lidocaine (XYLOCAINE) 2 % solution Use as directed 5 mLs in the mouth or throat as needed for mouth pain. Swish and spit Patient not taking: No sig reported 07/31/20   Harle Stanford., PA-C  magic mouthwash SOLN Take 5 mLs by mouth 4 (four) times daily as needed for mouth pain. Patient not taking: No sig reported 04/06/21   Magrinat, Virgie Dad, MD  prochlorperazine (COMPAZINE) 10 MG tablet Take 1 tablet (10 mg total) by mouth every 6 (six) hours as needed (Nausea or vomiting). Patient not taking: No sig reported 07/03/20   Magrinat, Virgie Dad, MD    Allergies    Ace inhibitors, Lisinopril, Amlodipine, Shellfish allergy, Adhesive [tape], and Latex  Review of Systems   Review of Systems  Constitutional:  Positive for activity change, appetite change, fatigue and fever.  HENT:  Negative for congestion.   Respiratory:  Positive for cough (chronic unchanged) and shortness of breath (dyspnea on exertion when hgb gets lower).   Cardiovascular:  Positive  for chest pain (right chest wall pain since mastectomy).  Gastrointestinal:  Negative for abdominal pain, diarrhea, nausea and vomiting.  Genitourinary:  Negative for dysuria.  Musculoskeletal:  Negative for myalgias.  Skin:  Negative for rash.  Neurological:  Negative for headaches (occasional not current).   Physical Exam Updated Vital Signs BP 132/79 (BP Location: Left Arm)   Pulse 75   Temp 98.5 F (36.9 C) (Oral)   Resp 18   Ht 4\' 11"  (1.499 m)   Wt 80.5 kg   LMP 12/21/2017   SpO2 100%   BMI 35.84 kg/m   Physical Exam Vitals and nursing note reviewed.  Constitutional:      General: She is not in acute distress.    Appearance: She is well-developed. She is not diaphoretic.  HENT:     Head: Normocephalic and atraumatic.  Eyes:     Conjunctiva/sclera: Conjunctivae normal.  Cardiovascular:     Rate and Rhythm: Normal rate and regular rhythm.     Heart sounds: Normal heart sounds. No murmur heard.   No friction rub. No gallop.  Pulmonary:     Effort: Pulmonary effort is normal. No respiratory distress.     Breath sounds: Normal breath sounds. No wheezing or rales.  Abdominal:     General: There is no distension.     Palpations: Abdomen is soft.     Tenderness: There is no abdominal tenderness. There is no guarding.  Musculoskeletal:        General: No tenderness.     Cervical back: Normal range of motion.  Skin:    General: Skin is warm and dry.     Findings: No erythema or rash.  Neurological:     Mental Status: She is alert and oriented to person, place, and time.    ED Results / Procedures / Treatments   Labs (all labs ordered are listed, but only abnormal results are displayed) Labs Reviewed  APTT - Abnormal; Notable for the following components:      Result Value   aPTT 37 (*)    All other components within normal limits  URINALYSIS, ROUTINE W  REFLEX MICROSCOPIC - Abnormal; Notable for the following components:   Hgb urine dipstick SMALL (*)     Protein, ur 30 (*)    Bacteria, UA RARE (*)    All other components within normal limits  RESP PANEL BY RT-PCR (FLU A&B, COVID) ARPGX2  CULTURE, BLOOD (ROUTINE X 2)  CULTURE, BLOOD (ROUTINE X 2)  URINE CULTURE  MRSA NEXT GEN BY PCR, NASAL  LACTIC ACID, PLASMA  PROTIME-INR  SODIUM, URINE, RANDOM  CREATININE, URINE, RANDOM  PROCALCITONIN  HIV ANTIBODY (ROUTINE TESTING W REFLEX)  COMPREHENSIVE METABOLIC PANEL  CBC  I-STAT BETA HCG BLOOD, ED ([facility], [facility], [facility] ONLY)  TYPE AND SCREEN  TYPE AND SCREEN    EKG EKG Interpretation  Date/Time:  Wednesday May 30 2021 11:46:52 EDT Ventricular Rate:  75 PR Interval:  175 QRS Duration: 105 QT Interval:  377 QTC Calculation: 421 R Axis:   31 Text Interpretation: Sinus rhythm No significant change since last tracing Confirmed by Gareth Morgan [phone]) on 05/30/2021 10:33:45 PM  Radiology US RENAL  Result Date: 05/30/2021 CLINICAL DATA:  Acute kidney injury EXAM: RENAL / URINARY TRACT ULTRASOUND COMPLETE COMPARISON:  None. FINDINGS: Right Kidney: Renal measurements: 10.7 x 4.3 x 4.6 cm = volume: 111 mL. Increased cortical echogenicity. No hydronephrosis or focal abnormality. Left Kidney: Renal measurements: 10.9 x 4.3 x 4.2 cm = volume: 104 mL. Increased cortical echogenicity. No hydronephrosis or focal abnormality. Bladder: Ureteral jets bilaterally.  Normal appearance. Other: No free fluid. IMPRESSION: Increased renal echogenicity compatible with medical renal disease. No acute finding or hydronephrosis. Electronically Signed   By: Jerilynn Mages.  Shick M.D.   On: 05/30/2021 16:51   DG Chest Port 1 View  Result Date: 05/30/2021 CLINICAL DATA:  Fever and shortness of breath EXAM: PORTABLE CHEST 1 VIEW COMPARISON:  02/26/2021 FINDINGS: Cardiac shadow is within normal limits. Mild fullness in the right hilum is noted consistent with the nodule seen on prior PET-CT examination. Lungs are otherwise clear. Minimal scarring is noted in the right base. Postsurgical  changes in the axilla are seen bilaterally. Left chest wall port is noted in satisfactory position. IMPRESSION: Fullness in the right hilum consistent with the known hilar nodule. No other focal abnormality is seen. Electronically Signed   By: Inez Catalina M.D.   On: 05/30/2021 11:55    Procedures Procedures   Medications Ordered in ED Medications  acyclovir (ZOVIRAX) tablet 400 mg (400 mg Oral Given 05/30/21 2209)  anastrozole (ARIMIDEX) tablet 1 mg (has no administration in time range)  venlafaxine XR (EFFEXOR-XR) 24 hr capsule 37.5 mg (has no administration in time range)  ferrous sulfate tablet 325 mg (has no administration in time range)  ascorbic acid (VITAMIN C) tablet 1,000 mg (has no administration in time range)  multivitamin with minerals tablet 1 tablet (has no administration in time range)  zinc sulfate capsule 220 mg (has no administration in time range)  heparin injection 5,000 Units (5,000 Units Subcutaneous Given 05/30/21 2209)  sodium bicarbonate 150 mEq in sterile water 1,150 mL infusion ( Intravenous New Bag/Given 05/30/21 2209)  guaiFENesin (MUCINEX) 12 hr tablet 600 mg (600 mg Oral Given 05/30/21 2209)  ceFEPIme (MAXIPIME) 2 g in sodium chloride 0.9 % 100 mL IVPB (has no administration in time range)  vancomycin (VANCOREADY) IVPB 750 mg/150 mL (has no administration in time range)  sodium chloride 0.9 % bolus 1,000 mL (0 mLs Intravenous Stopped 05/30/21 1526)    And  sodium chloride 0.9 % bolus 1,000 mL (0 mLs  Intravenous Stopped 05/30/21 1526)    And  sodium chloride 0.9 % bolus 500 mL (0 mLs Intravenous Stopped 05/30/21 1430)  ceFEPIme (MAXIPIME) 2 g in sodium chloride 0.9 % 100 mL IVPB (0 g Intravenous Stopped 05/30/21 1258)  metroNIDAZOLE (FLAGYL) IVPB 500 mg (0 mg Intravenous Stopped 05/30/21 1432)  vancomycin (VANCOREADY) IVPB 1250 mg/250 mL (0 mg Intravenous Stopped 05/30/21 1840)    ED Course  I have reviewed the triage vital signs and the nursing notes.  Pertinent  labs & imaging results that were available during my care of the patient were reviewed by me and considered in my medical decision making (see chart for details).    MDM Rules/Calculators/A&P                           50 year old female with a history of triple positive breast cancer with progression, on T-DM1 (monoclonal ab treatment) every 3 weeks, anemia for which she has been receiving outpatient infusions, presents from the cancer center today with concern for low blood pressure, fever and fatigue.  Labs from cancer center show AKI on CKD, mild hyperkalemia, anemia with hgb 7.3   Fever, leukocytosis, tachypnea consistent with possible sepsis however no clear source on hx, exam, UA. Given empiric abx.  Blood pressures down to 27X systolic. Given high risk and concern for sepsis with unclear source, given IV fluids, abx, admitted for further care.   Final Clinical Impression(s) / ED Diagnoses Final diagnoses:  AKI (acute kidney injury) ([place])  Sepsis, due to unspecified organism, unspecified whether acute organ dysfunction present ([place])  Fever, unspecified    Rx / DC Orders ED Discharge Orders     None        Gareth Morgan, MD 05/30/21 2312

## 2021-05-30 NOTE — Progress Notes (Signed)
Pharmacy Antibiotic Note  [name] [name] is a 50 y.o. female admitted on 05/30/2021 with sepsis.  Pharmacy has been consulted for vancomycin + cefepime dosing.  PMH significant for metastatic breast cancer, CKD baseline SCr ~1.6-1.9. Admitted with sepsis, AKI.   Today, 05/30/21 WBC slightly elevated SCr 2.77, elevated from baseline. CrCl ~22 mL/min Afebrile  Plan: Cefepime 2 g IV q24h Vancomycin 1250 mg LD given followed by 750 mg IV q48h for estimated AUC 519 Goal vancomycin AUC 400-550 Monitor renal function, culture data. Check vancomycin levels as needed  Height: 4\' 11"  (149.9 cm) Weight: 78.9 kg (173 lb 15.1 oz) IBW/kg (Calculated) : 43.2  Temp (24hrs), Avg:100.5 F (38.1 C), Min:99.2 F (37.3 C), Max:101.3 F (38.5 C)  Recent Labs  Lab 05/30/21 0917 05/30/21 1214  WBC 12.8*  --   CREATININE 2.77*  --   LATICACIDVEN  --  1.4    Estimated Creatinine Clearance: 22.3 mL/min (A) (by C-G formula based on SCr of 2.77 mg/dL (H)).    Allergies  Allergen Reactions   Ace Inhibitors Swelling    Swelling of lips and face   Lisinopril Swelling    Swelling of lips, face   Amlodipine Swelling    Leg swelling   Shellfish Allergy Swelling   Adhesive [Tape] Itching and Rash    Please use paper tape   Latex Itching and Rash    Antimicrobials this admission: vancomycin 6/29 >>  cefepime 6/29 >>   Dose adjustments this admission:  Microbiology results: 6/29 BCx: Sent 6/29 UCx: Sent   Thank you for allowing pharmacy to be a part of this patient's care.  Lenis Noon, PharmD 05/30/2021 4:28 PM

## 2021-05-30 NOTE — Sepsis Progress Note (Signed)
ELINK tracking the Code Sepsis.

## 2021-05-30 NOTE — Progress Notes (Signed)
COURTESY NOTE:  Appreciate your help to this young woman with metastatic breast cancer. I am summarizing her cancer history below. I added a type and screen to AM labs inc case she ends up needing (and agrees to) a tansfusion. Will refer to renal as outpatient. We will see her again at the cancer center 7/7 at 2:30 PM labs and visit  Summary:  50 y.o. [name] [name], [place] woman   (1) status post left lumpectomy in 2000 for a (?) T2N0 breast cancer,             (a) status post adjuvant chemotherapy             (b) status post adjuvant radiation             (c) did not receive antiestrogens   (2) genetics testing May 18, 2018 through the common Hereditary Cancers Panel + Myelodysplastic Syndrome/Leukemia Panel found no deleterious mutations in APC, ATM, AXIN2, BARD1, BLM, BMPR1A, BRCA1, BRCA2, BRIP1, CDH1, CDK4, CDKN2A (p14ARF), CDKN2A (p16INK4a), CEBPA, CHEK2, CTNNA1, DICER1, EPCAM*, GATA2, GREM1*, HRAS, KIT, MEN1, MLH1, MSH2, MSH3, MSH6, MUTYH, NBN, NF1, PALB2, PDGFRA, PMS2, POLD1, POLE, PTEN, RAD50, RAD51C, RAD51D, RUNX1, SDHB, SDHC, SDHD, SMAD4, SMARCA4, STK11, TERC, TERT, TP53, TSC1, TSC2, VHL The following genes were evaluated for sequence changes only: HOXB13*, NTHL1*, SDHA             (a)  3 variants of uncertain significance were identified in the genes BARD1 c.764A>G (p.Asn255Ser), BRIP1 c.2563C>T (p.Arg855Cys), and PALB2 c.3103A>T (p.Ile1035Phe).   (3) status post right breast biopsy 03/16/2018 for a clinical mT2-3 N2, anatomic stage III invasive ductal carcinoma, grade 2, triple positive, with an MIB-1 of 80%.             (a) bone scan and chest CT scan negative for metastases except for a T6 lytic lesion of concern             (b) thoracic spine MRI was ordered May 2019 but never performed.   (4) neoadjuvant chemotherapy consisting of carboplatin, docetaxel, trastuzumab and Pertuzumab every 3 weeks x 6 starting 04/16/2018             (a) pertuzumab held beginning with cycle 2 because of  diarrhea   (5) trastuzumab was to be continued Q4w indefinitely, discontinued after 10/21/2019 dose with progression             (a) echocardiogram on 06/29/2018 showed an ejection fraction in the 65-70% range             (b) echocardiogram 11/19/2018 showed an ejection fraction in the 60-65% range             (c) echo 04/07/2019 shows an ejection fraction in the 55-60% range             (d) echo 07/15/2019 shows EF of 60-65%--for additional echoes see under #12 below   (6) right mastectomy and sentinel lymph node sampling on 08/26/2018 found a residual 2.5cm invasive ductal carcinoma, with 2 of 5 sentinel lymph nodes positive, ypT2, ypN1a; margins were clear             (a) repeat prognostic panel confirms tumor still triple positive (HER2 3+)   (7) adjuvant radiation 09/30/2018 - 11/16/2018  Site/dose:   The patient initially received a dose of 50.4 Gy in 28 fractions to the right chest wall and supraclavicular region. This was delivered using a 3-D conformal, 4 field technique. The patient then received a boost to the  mastectomy scar. This delivered an additional 10 Gy in 5 fractions using an en face electron field. The total dose was 60.4   (8) anastrozole started 12/03/2018             (a) [place] on 10/01/2018 was 64.3, and estradiol 7.0, consistent with menopause             (b) referral to pelvic floor rehabilitation 12/03/2018             (c) [place] on 03/16/2020 was 80.8 with estradiol less than 2.5              METASTATIC DISEASE: March 2020 (9) CT of neck and CT angiography of the chest 02/04/2019 finds the T6 lytic lesion noted May 2019 (see #3 above) is now sclerotic; a sclerotic T3 lesion is stable; there is a new lytic lesion in the manubrium             (a) PET scan 03/12/2019 shows manubrium, T3 and T5 metastases, no visceral disease             (b) biopsy of manubrial lesion planned but not done secondary to pandemic             (c) Bone scan 09/01/19 shows ? progression of disease  in spine; MRI 09/02/19 shows early metastases in T10 (new), T3 and T5 stable; CT chest 09/01/19 shows 18m pulmonary nodules that are new, but nonspecific and warrant future follow up             (d) PET 10/26/2019 shows no lung or liver lesions; new mediastinal adenopathy, bone lesions-- T-DM1 started             (e) PET scan 03/08/2020 shows resolution of the mediastinal adenopathy and hypermetabolic bone lesions, questionable subcutaneous nodule along the left lateral shoulder             (f) for additional staging studies see under "12" below   (10) SRS to spine, palliative treatment to sternum, from 05/13/2019 through 05/26/2019: 1. Chest_sternum // 40 Gy in 10 fractions 2. Thoracic Spine (T3) // 18 Gy in 1 fraction   3. Thoracic Spine (T5) // 18 Gy in 1 fraction    (11) denosumab/Xgeva started 09/08/2019, currently given every 6 weeks   (12) T-DM1 started 01/15/20201             (a) echo 10/26/2019 shows stable EF at 55-60%             (b) echo 03/08/2020 shows an ejection fraction in the 70-75% range             (c) echo 07/12/2020 shows an ejection fraction in the 65 to 70% range.             (d) PET scan 03/08/2020 shows no active disease; a 0.9 cm subcutaneous nodule in the left shoulder area seen on the PET was not identifiable by exam 03/16/2020             (e) PET 08/24/2020 shows stable disease except for a new 0.6 cm lymph node with an SUV of 10.1             (f) bronchoscopy 09/05/2020 with endobronchial ultrasound found no detectable mass in the region noted on the September PET scan             (g) T-DM1 discontinued and trastuzumab resumed 11/29/2020             (h)  PET scan 02/26/2021 shows mild nodular increase: TDM-1resumed 03/07/2021

## 2021-05-30 NOTE — H&P (Addendum)
History and Physical    [name] Ballin QVZ:[id number] DOB: May 28, 1971 DOA: 05/30/2021  PCP: Marda Stalker, PA-C   Patient coming from: Home    Chief Complaint: Generalized weakness  HPI: [name] [name] is a 50 y.o. female with medical history significant of right-sided triple positive metastatic breast cancer, TIA, morbid obesity, hypercalcemia, thrombocytopenia, CKD stage IV, hypertension who was referred to the emergency department from cancer center when she was found to have fever, low blood pressure.  She had an appointment at the cancer center today for her regular blood work.  Patient reported that she was feeling weak, had low appetite and having cough at home.  She denies any fever or chills at home.  She denied any sick contacts.  She has been vaccinated against COVID. Patient is on chemotherapy for her breast cancer and follows with Dr. Jana Hakim.  Her last chemotherapy was on 6 th of June. Patient seen and examined the bedside in the emergency department.  During my evaluation, she was hemodynamically stable, overall comfortable.  When she presented at cancer center, she was febrile with fever of 101 F, blood pressure was low with systolic of 32R.  Patient was also noticed to be mildly tachypneic.  ED Course: Blood pressure was soft on presentation.  Started on IV fluids.  Urine culture, blood cultures have been sent.  Given a dose of vancomycin and cefepime in the emergency department.  COVID screen test is pending .patient was admitted for the management of possible sepsis without clear source of infection.  Review of Systems: As per HPI otherwise 10 point review of systems negative.    Past Medical History:  Diagnosis Date   Anxiety    Breast cancer, left breast ([place]) 2000   Underwent lumpectomy, chemotherapy and radiation   Facial paralysis/Bells palsy    left side   Family history of lung cancer    Family history of lymphoma    Hypertension    Neuropathy    IN  FINGERS AND TOES   RECENT INFUSION OF CHEMO   Personal history of breast cancer 05/06/2018   Renal disorder    just after TIA acute kidney injury   TIA (transient ischemic attack) 03/16/2017   [facility]    Past Surgical History:  Procedure Laterality Date   BREAST LUMPECTOMY WITH AXILLARY LYMPH NODE BIOPSY Left 2000   biopsy   BREAST SURGERY     partial mastectomy with lymph node removal   ENDOBRONCHIAL ULTRASOUND Bilateral 09/05/2020   Procedure: ENDOBRONCHIAL ULTRASOUND;  Surgeon: Collene Gobble, MD;  Location: [facility];  Service: Cardiopulmonary;  Laterality: Bilateral;   MASTECTOMY Right 2019   & partial mastectomy left breast 2000   MASTECTOMY MODIFIED RADICAL Right 08/26/2018   Procedure: MASTECTOMY MODIFIED RADICAL;  Surgeon: Jovita Kussmaul, MD;  Location: [place];  Service: General;  Laterality: Right;   MODIFIED MASTECTOMY Right 08/26/2018   PORTACATH PLACEMENT Left 04/08/2018   Procedure: INSERTION PORT-A-CATH;  Surgeon: Jovita Kussmaul, MD;  Location: [place];  Service: General;  Laterality: Left;   TUBAL LIGATION Bilateral 2004   VIDEO BRONCHOSCOPY N/A 09/05/2020   Procedure: VIDEO BRONCHOSCOPY WITHOUT FLUORO;  Surgeon: Collene Gobble, MD;  Location: [facility];  Service: Cardiopulmonary;  Laterality: N/A;   WISDOM TOOTH EXTRACTION       reports that she has never smoked. She has never used smokeless tobacco. She reports that she does not drink alcohol and does not use drugs.  Allergies  Allergen Reactions  Ace Inhibitors Swelling    Swelling of lips and face   Lisinopril Swelling    Swelling of lips, face   Amlodipine Swelling    Leg swelling   Shellfish Allergy Swelling   Adhesive [Tape] Itching and Rash    Please use paper tape   Latex Itching and Rash    Family History  Problem Relation Age of Onset   Hypertension Mother    Hypertension Father    Diverticulosis Father    Diabetes Father    Lung cancer Father 59       hx smoking   Hypertension  Maternal Grandmother    CAD Maternal Grandmother    Alzheimer's disease Maternal Grandmother        died at 37   Hypertension Paternal Grandmother    CAD Paternal Grandmother    Stroke Paternal Grandfather    Sickle cell trait Paternal Grandfather    Pneumonia Maternal Aunt        died at a young adult   Lymphoma Paternal Aunt 74     Prior to Admission medications   Medication Sig Start Date End Date Taking? Authorizing Provider  acyclovir (ZOVIRAX) 400 MG tablet TAKE 1 TABLET BY MOUTH TWICE A DAY 09/22/20   Magrinat, Virgie Dad, MD  anastrozole (ARIMIDEX) 1 MG tablet Take 1 tablet (1 mg total) by mouth daily. 09/20/20   Magrinat, Virgie Dad, MD  Ascorbic Acid (VITAMIN C) 1000 MG tablet Take 1,000 mg by mouth daily.    [provider]  carvedilol (COREG) 3.125 MG tablet TAKE 1 TABLET (3.125 MG TOTAL) BY MOUTH 2 (TWO) TIMES DAILY WITH A MEAL. 03/01/21   Magrinat, Virgie Dad, MD  Cholecalciferol (VITAMIN D3 PO) Take by mouth daily.    [provider]  Cyanocobalamin (VITAMIN B12) 500 MCG TABS Take 500 mcg by mouth daily.     [provider]  ferrous sulfate 325 (65 FE) MG tablet Take 1 tablet (325 mg total) by mouth daily. 11/06/16 09/05/20  Daymon Larsen, MD  lidocaine (XYLOCAINE) 2 % solution Use as directed 5 mLs in the mouth or throat as needed for mouth pain. Swish and spit 07/31/20   Tanner, Lyndon Code., PA-C  lidocaine-prilocaine (EMLA) cream Apply 1 application topically as needed. 11/06/20   Magrinat, Virgie Dad, MD  losartan (COZAAR) 50 MG tablet TAKE 1 TABLET BY MOUTH EVERY DAY 12/18/20   Bensimhon, Shaune Pascal, MD  magic mouthwash SOLN Take 5 mLs by mouth 4 (four) times daily as needed for mouth pain. 04/06/21   Magrinat, Virgie Dad, MD  Multiple Vitamin (MULITIVITAMIN WITH MINERALS) TABS Take 1 tablet by mouth daily.    [provider]  prochlorperazine (COMPAZINE) 10 MG tablet Take 1 tablet (10 mg total) by mouth every 6 (six) hours as needed (Nausea or vomiting).  07/03/20   Magrinat, Virgie Dad, MD  spironolactone-hydrochlorothiazide (ALDACTAZIDE) 25-25 MG tablet TAKE 1 TABLET BY MOUTH EVERY DAY 05/31/20   Magrinat, Virgie Dad, MD  traMADol (ULTRAM) 50 MG tablet Take 1-2 tablets (50-100 mg total) by mouth every 6 (six) hours as needed. 02/28/21   Magrinat, Virgie Dad, MD  venlafaxine XR (EFFEXOR-XR) 37.5 MG 24 hr capsule TAKE 1 CAPSULE BY MOUTH DAILY WITH BREAKFAST. 05/08/21   Magrinat, Virgie Dad, MD  zinc gluconate 50 MG tablet Take 50 mg by mouth daily.    [provider]    Physical Exam: Vitals:   05/30/21 1245 05/30/21 1330 05/30/21 1400 05/30/21 1430  BP: (!) 114/55  94/72 113/63 119/60  Pulse: 77 82 77 76  Resp: (!) 27 17 (!) 26 (!) 27  Temp:      TempSrc:      SpO2: 100% 100% 98% 100%  Weight:      Height:        Constitutional: NAD, calm, comfortable Vitals:   05/30/21 1245 05/30/21 1330 05/30/21 1400 05/30/21 1430  BP: (!) 114/55 94/72 113/63 119/60  Pulse: 77 82 77 76  Resp: (!) 27 17 (!) 26 (!) 27  Temp:      TempSrc:      SpO2: 100% 100% 98% 100%  Weight:      Height:       Eyes: PERRL, lids and conjunctivae normal ENMT: Mucous membranes are moist.  Neck: normal, supple, no masses, no thyromegaly Respiratory: clear to auscultation bilaterally, no wheezing, no crackles. Normal respiratory effort. No accessory muscle use.  Cardiovascular: Regular rate and rhythm, no murmurs / rubs / gallops. No extremity edema.  Abdomen: no tenderness, no masses palpated. No hepatosplenomegaly. Bowel sounds positive.  Musculoskeletal: no clubbing / cyanosis. No joint deformity upper and lower extremities.  Skin: no rashes, lesions, ulcers. No induration Neurologic: CN 2-12 grossly intact.  Strength 5/5 in all 4.  Psychiatric: Normal judgment and insight. Alert and oriented x 3. Normal mood.   Foley Catheter:None  Labs on Admission: I have personally reviewed following labs and imaging studies  CBC: Recent Labs  Lab 05/30/21 0917  WBC  12.8*  NEUTROABS 10.7*  HGB 7.3*  HCT 22.0*  MCV 85.9  PLT 998*   Basic Metabolic Panel: Recent Labs  Lab 05/30/21 0917  NA 131*  K 5.3*  CL 108  CO2 13*  GLUCOSE 137*  BUN 65*  CREATININE 2.77*  CALCIUM 11.3*   GFR: Estimated Creatinine Clearance: 22.3 mL/min (A) (by C-G formula based on SCr of 2.77 mg/dL (H)). Liver Function Tests: Recent Labs  Lab 05/30/21 0917  AST 96*  ALT 56*  ALKPHOS 163*  BILITOT 1.8*  PROT 7.7  ALBUMIN 2.8*   No results for input(s): LIPASE, AMYLASE in the last 168 hours. No results for input(s): AMMONIA in the last 168 hours. Coagulation Profile: Recent Labs  Lab 05/30/21 1214  INR 1.1   Cardiac Enzymes: No results for input(s): CKTOTAL, CKMB, CKMBINDEX, TROPONINI in the last 168 hours. BNP (last 3 results) No results for input(s): PROBNP in the last 8760 hours. HbA1C: No results for input(s): HGBA1C in the last 72 hours. CBG: No results for input(s): GLUCAP in the last 168 hours. Lipid Profile: No results for input(s): CHOL, HDL, LDLCALC, TRIG, CHOLHDL, LDLDIRECT in the last 72 hours. Thyroid Function Tests: No results for input(s): TSH, T4TOTAL, FREET4, T3FREE, THYROIDAB in the last 72 hours. Anemia Panel: No results for input(s): VITAMINB12, FOLATE, FERRITIN, TIBC, IRON, RETICCTPCT in the last 72 hours. Urine analysis:    Component Value Date/Time   COLORURINE YELLOW 05/30/2021 1153   APPEARANCEUR CLEAR 05/30/2021 1153   LABSPEC 1.010 05/30/2021 1153   PHURINE 5.0 05/30/2021 1153   GLUCOSEU NEGATIVE 05/30/2021 1153   HGBUR SMALL (A) 05/30/2021 1153   BILIRUBINUR NEGATIVE 05/30/2021 1153   [place] 05/30/2021 1153   PROTEINUR 30 (A) 05/30/2021 1153   UROBILINOGEN 0.2 11/29/2011 1656   NITRITE NEGATIVE 05/30/2021 1153   LEUKOCYTESUR NEGATIVE 05/30/2021 1153    Radiological Exams on Admission: DG Chest Port 1 View  Result Date: 05/30/2021 CLINICAL DATA:  Fever and shortness of breath EXAM: PORTABLE CHEST 1  VIEW COMPARISON:  02/26/2021 FINDINGS: Cardiac shadow is within normal limits. Mild fullness in the right hilum is noted consistent with the nodule seen on prior PET-CT examination. Lungs are otherwise clear. Minimal scarring is noted in the right base. Postsurgical changes in the axilla are seen bilaterally. Left chest wall port is noted in satisfactory position. IMPRESSION: Fullness in the right hilum consistent with the known hilar nodule. No other focal abnormality is seen. Electronically Signed   By: Inez Catalina M.D.   On: 05/30/2021 11:55     Assessment/Plan Active Problems:   Malignant neoplasm of upper-outer quadrant of right breast in female, estrogen receptor positive ([place])   Essential hypertension   Morbid obesity with BMI of 40.0-44.9, adult (HCC)   Anemia associated with stage 4 chronic renal failure (HCC)   Sepsis (HCC)   CKD (chronic kidney disease) stage 4, GFR 15-29 ml/min (HCC)  Sepsis: Presented with generalized weakness, hypotension.  Lactic acid level normal.  Patient is immunocompromised, will start on broad-spectrum antibiotics with vancomycin and cefepime.  Follow-up cultures.  She denies any dysuria.  Complains of productive cough but chest x-ray did not show any pneumonia.  Will check MRSA PCR, if negative, can discontinue vancomycin as soon as possible because of her renal insufficiency. Patient has mild leukocytosis.  We will check procalcitonin  AKI on CKD stage IV: Does not follow with nephrology.  Baseline creatinine in the range of 1.6-1.9.  Patient looks dehydrated.  We will continue IV fluids.  Will check renal ultrasound, urine sodium and urine creatinine.  She is also mildly hyperkalemic.  Will check BMP tomorrow.  Chronic normocytic anemia: Associated with CKD, malignancy, chemotherapy and iron deficiency.  Her baseline hemoglobin hemoglobin is in the range of 8.  She takes iron supplements.  Non-anion gap metabolic acidosis: Most likely secondary to renal  insufficiency.  Continue isotonic fluid with bicarb.  Thrombocytopenia: Chronic.  Likely associated  with malignancy/chemotherapy.  Metastatic right-sided breast cancer: Follows with Dr. Jana Hakim, currently on chemotherapy and anastrozole.  Last chemo on 6 June.  She has triple positive breast cancer.  She is status postlumpectomy, chemotherapy and radiation.  There was concern of T6 lytic lesion for possible metastatic disease.  Bone scan done on 9/20 showed progression of the disease in the spine.  Hypercalcemia: Most likely secondary to malignancy.  Continue monitor.  Continue IV fluids  Elevated liver enzymes: Chronic.  Most likely associated  with malignancy / chemotherapy.  Also has elevated ALP  Anxiety: On venlafaxine  Hypertension: Takes losartan, carvedilol, spironolactone-hydrochlorothiazide at home, these are on hold due to hypotension, AKI    Severity of Illness: The appropriate patient status for this patient is inpatient  DVT prophylaxis: Heparin [place] Code Status: Full Family Communication: Daughter present at bedside Consults called: None     Shelly Coss MD [facility]  05/30/2021, 4:12 PM

## 2021-05-30 NOTE — Progress Notes (Signed)
Patient presented with abnormal labs, low BP, and fever to the infusion room. Dr. Jana Hakim aware and advised for patient to be sent to the ED for further evaluation for sepsis. Charge nurse called. Patient transported to ED via wheelchair. Bedside report given to RN.

## 2021-05-30 NOTE — ED Triage Notes (Signed)
Pt brought from cancer center, was there to get chemo treatment but was noted to have a fever (100.8) and hgb 7 and K+ 5.6. Pt current temp is 99.2. Pt denies pain, n/v/d Hx of breast cancer

## 2021-05-30 NOTE — Progress Notes (Signed)
Pt arrived to infusion today for treatment, noted to have a fever (100.8) and hgb 7 and K+ 5.6. Pt current temp is 99.2. Pt denies pain. Per Dr, Jana Hakim patient transported to ED for evaluation.

## 2021-05-30 NOTE — Plan of Care (Signed)
  Problem: Clinical Measurements: Goal: Diagnostic test results will improve Outcome: Progressing Goal: Signs and symptoms of infection will decrease Outcome: Progressing

## 2021-05-31 ENCOUNTER — Telehealth: Payer: Self-pay | Admitting: Oncology

## 2021-05-31 ENCOUNTER — Other Ambulatory Visit: Payer: Self-pay | Admitting: Oncology

## 2021-05-31 DIAGNOSIS — C7951 Secondary malignant neoplasm of bone: Secondary | ICD-10-CM

## 2021-05-31 DIAGNOSIS — Z923 Personal history of irradiation: Secondary | ICD-10-CM

## 2021-05-31 DIAGNOSIS — N184 Chronic kidney disease, stage 4 (severe): Secondary | ICD-10-CM

## 2021-05-31 DIAGNOSIS — C50911 Malignant neoplasm of unspecified site of right female breast: Secondary | ICD-10-CM

## 2021-05-31 DIAGNOSIS — D649 Anemia, unspecified: Secondary | ICD-10-CM

## 2021-05-31 DIAGNOSIS — R509 Fever, unspecified: Secondary | ICD-10-CM

## 2021-05-31 DIAGNOSIS — Z170 Estrogen receptor positive status [ER+]: Secondary | ICD-10-CM

## 2021-05-31 DIAGNOSIS — Z9221 Personal history of antineoplastic chemotherapy: Secondary | ICD-10-CM

## 2021-05-31 LAB — COMPREHENSIVE METABOLIC PANEL WITH GFR
ALT: 40 U/L (ref 0–44)
AST: 65 U/L — ABNORMAL HIGH (ref 15–41)
Albumin: 2.6 g/dL — ABNORMAL LOW (ref 3.5–5.0)
Alkaline Phosphatase: 116 U/L (ref 38–126)
Anion gap: 9 (ref 5–15)
BUN: 63 mg/dL — ABNORMAL HIGH (ref 6–20)
CO2: 18 mmol/L — ABNORMAL LOW (ref 22–32)
Calcium: 10.1 mg/dL (ref 8.9–10.3)
Chloride: 107 mmol/L (ref 98–111)
Creatinine, Ser: 2.43 mg/dL — ABNORMAL HIGH (ref 0.44–1.00)
GFR, Estimated: 24 mL/min — ABNORMAL LOW
Glucose, Bld: 109 mg/dL — ABNORMAL HIGH (ref 70–99)
Potassium: 4.5 mmol/L (ref 3.5–5.1)
Sodium: 134 mmol/L — ABNORMAL LOW (ref 135–145)
Total Bilirubin: 1.4 mg/dL — ABNORMAL HIGH (ref 0.3–1.2)
Total Protein: 6.3 g/dL — ABNORMAL LOW (ref 6.5–8.1)

## 2021-05-31 LAB — CBC
HCT: 18.8 % — ABNORMAL LOW (ref 36.0–46.0)
Hemoglobin: 6 g/dL — CL (ref 12.0–15.0)
MCH: 28.8 pg (ref 26.0–34.0)
MCHC: 31.9 g/dL (ref 30.0–36.0)
MCV: 90.4 fL (ref 80.0–100.0)
Platelets: 87 10*3/uL — ABNORMAL LOW (ref 150–400)
RBC: 2.08 MIL/uL — ABNORMAL LOW (ref 3.87–5.11)
RDW: 19.6 % — ABNORMAL HIGH (ref 11.5–15.5)
WBC: 9.2 10*3/uL (ref 4.0–10.5)
nRBC: 0 % (ref 0.0–0.2)

## 2021-05-31 LAB — MRSA PCR SCREENING: MRSA by PCR: NEGATIVE

## 2021-05-31 LAB — PREPARE RBC (CROSSMATCH)

## 2021-05-31 MED ORDER — SODIUM CHLORIDE 0.9% IV SOLUTION: Freq: Once | INTRAVENOUS | Status: DC

## 2021-05-31 MED ORDER — CHLORHEXIDINE GLUCONATE CLOTH 2 % EX PADS
6.0000 | MEDICATED_PAD | Freq: Every day | CUTANEOUS | Status: DC
  Administered 2021-05-31 – 2021-06-05 (×6): 6 via TOPICAL

## 2021-05-31 MED ORDER — SODIUM CHLORIDE 0.9% FLUSH
10.0000 mL | INTRAVENOUS | Status: DC | PRN
  Administered 2021-06-05: 10 mL

## 2021-05-31 NOTE — Progress Notes (Signed)
PROGRESS NOTE    [name] [name]  CWU:[id number] DOB: 1971/07/28 DOA: 05/30/2021 PCP: Marda Stalker, PA-C   Chief Complain: Generalized weakness   Brief Narrative: [name] [name] is a 50 y.o. female with medical history significant of right-sided triple positive metastatic breast cancer, TIA, morbid obesity, hypercalcemia, thrombocytopenia, CKD stage IV, hypertension who was referred to the emergency department from cancer center when she was found to have fever, low blood pressure.  Patient is on chemotherapy for breast cancer. When she presented at cancer center, she was febrile with fever of 101 F, blood pressure was low with systolic of 38U.  Patient was also noticed to be mildly tachypneic.  Lab work showed AKI.  Culture sent.  Patient admitted for the suspicion of sepsis without any clear source.  Started on broad-spectrum antibiotics.   Assessment & Plan:   Active Problems:   Malignant neoplasm of upper-outer quadrant of right breast in female, estrogen receptor positive ([place])   Essential hypertension   Morbid obesity with BMI of 40.0-44.9, adult (HCC)   Anemia associated with stage 4 chronic renal failure (HCC)   Sepsis (HCC)   CKD (chronic kidney disease), stage IV ([place])   Sepsis: Presented with generalized weakness, hypotension.  Lactic acid level normal.  Patient is immunocompromised, started  on broad-spectrum antibiotics with vancomycin and cefepime.  Follow-up cultures,NGTD.  She denies any dysuria.  Complains of productive cough but chest x-ray did not show any pneumonia.  MRSA PCR negative,so discontinued vancomycin .continue cefepime pending cultures.  Patient had mild leukocytosis.  Has elevated procalcitonin   AKI on CKD stage IV: Does not follow with nephrology.  Baseline creatinine in the range of 1.6-1.9.  Patient looked dehydrated.  We will continue IV fluids.  Renal ultrasound showed decreased echogenicity consistent with medical renal disease, no hydronephrosis.    Chronic normocytic anemia: Associated with CKD, malignancy, chemotherapy and iron deficiency.  Her baseline hemoglobin hemoglobin is in the range of 8.  She takes iron supplements.  Hemoglobin dropped to the range of 6 today, being transfused with 2 units of PRBC.   Non-anion gap metabolic acidosis: Most likely secondary to renal insufficiency.  Continue isotonic fluid with bicarb.   Thrombocytopenia: Chronic.  Likely associated  with malignancy/chemotherapy.   Metastatic right-sided breast cancer: Follows with Dr. Jana Hakim, currently on chemotherapy and anastrozole.  Last chemo on 6 June.  She has triple positive breast cancer.  She is status postlumpectomy, chemotherapy and radiation.  There was concern of T6 lytic lesion for possible metastatic disease.  Bone scan done on 9/20 showed progression of the disease in the spine.   Hypercalcemia: Improved.Most likely secondary to malignancy.  Continue monitor.  Continue IV fluids   Elevated liver enzymes: Chronic.  Most likely associated  with malignancy / chemotherapy.  Also has elevated ALP   Anxiety: On venlafaxine   Hypertension: Takes losartan, carvedilol, spironolactone-hydrochlorothiazide at home, these are on hold due to hypotension, AKI         DVT prophylaxis:Heparin [place] Code Status: Full Family Communication: Husband at bedside Status is: Inpatient  Remains inpatient appropriate because:Inpatient level of care appropriate due to severity of illness  Dispo: The patient is from: Home              Anticipated d/c is to: Home              Patient currently is not medically stable to d/c.   Difficult to place patient No     Consultants: Oncology  Procedures:None  Antimicrobials:  Anti-infectives (From admission, onward)    Start     Dose/Rate Route Frequency Ordered Stop   06/01/21 1400  vancomycin (VANCOREADY) IVPB 750 mg/150 mL  Status:  Discontinued        750 mg 150 mL/hr over 60 Minutes Intravenous Every 48  hours 05/30/21 1627 05/31/21 1141   05/31/21 1200  ceFEPIme (MAXIPIME) 2 g in sodium chloride 0.9 % 100 mL IVPB        2 g 200 mL/hr over 30 Minutes Intravenous Every 24 hours 05/30/21 1626     05/30/21 2200  acyclovir (ZOVIRAX) tablet 400 mg        400 mg Oral 2 times daily 05/30/21 1549     05/30/21 1145  vancomycin (VANCOREADY) IVPB 1250 mg/250 mL        1,250 mg 166.7 mL/hr over 90 Minutes Intravenous  Once 05/30/21 1138 05/30/21 1840   05/30/21 1130  ceFEPIme (MAXIPIME) 2 g in sodium chloride 0.9 % 100 mL IVPB        2 g 200 mL/hr over 30 Minutes Intravenous  Once 05/30/21 1129 05/30/21 1258   05/30/21 1130  metroNIDAZOLE (FLAGYL) IVPB 500 mg        500 mg 100 mL/hr over 60 Minutes Intravenous  Once 05/30/21 1129 05/30/21 1432   05/30/21 1130  vancomycin (VANCOCIN) IVPB 1000 mg/200 mL premix  Status:  Discontinued        1,000 mg 200 mL/hr over 60 Minutes Intravenous  Once 05/30/21 1129 05/30/21 1138       Subjective:  Patient seen and examined at the bedside this morning.  She was about to take bath.  Husband at the bedside.  She feels better today.  She feels more energized.  No fever overnight.  Blood pressure improved but still soft.  Eager to go home  Objective: Vitals:   05/30/21 1949 05/30/21 2335 05/31/21 0417 05/31/21 0750  BP: 132/79 (!) 98/46 (!) 105/57 123/64  Pulse: 75 79 83 79  Resp: 18 18 16 20   Temp: 98.5 F (36.9 C) 98.8 F (37.1 C) 99.5 F (37.5 C) 98.7 F (37.1 C)  TempSrc: Oral  Oral Oral  SpO2: 100% 96% 93% 100%  Weight: 80.5 kg     Height: 4\' 11"  (1.499 m)       Intake/Output Summary (Last 24 hours) at 05/31/2021 1142 Last data filed at 05/31/2021 0300 Gross per 24 hour  Intake 601.81 ml  Output --  Net 601.81 ml   Filed Weights   05/30/21 1039 05/30/21 1949  Weight: 78.9 kg 80.5 kg    Examination:  General exam: Overall comfortable, not in distress, obese HEENT: PERRL Respiratory system:  no wheezes or crackles  Cardiovascular  system: S1 & S2 heard, RRR.  Chemo-Port on the left chest Gastrointestinal system: Abdomen is nondistended, soft and nontender. Central nervous system: Alert and oriented Extremities: Bilateral lower extremity nonpitting edema, lymphedema of the left upper extremity skin: No rashes, no ulcers,no icterus      Data Reviewed: I have personally reviewed following labs and imaging studies  CBC: Recent Labs  Lab 05/30/21 0917 05/31/21 0615  WBC 12.8* 9.2  NEUTROABS 10.7*  --   HGB 7.3* 6.0*  HCT 22.0* 18.8*  MCV 85.9 90.4  PLT 107* 87*   Basic Metabolic Panel: Recent Labs  Lab 05/30/21 0917 05/31/21 0615  NA 131* 134*  K 5.3* 4.5  CL 108 107  CO2 13* 18*  GLUCOSE 137* 109*  BUN 65*  63*  CREATININE 2.77* 2.43*  CALCIUM 11.3* 10.1   GFR: Estimated Creatinine Clearance: 25.7 mL/min (A) (by C-G formula based on SCr of 2.43 mg/dL (H)). Liver Function Tests: Recent Labs  Lab 05/30/21 0917 05/31/21 0615  AST 96* 65*  ALT 56* 40  ALKPHOS 163* 116  BILITOT 1.8* 1.4*  PROT 7.7 6.3*  ALBUMIN 2.8* 2.6*   No results for input(s): LIPASE, AMYLASE in the last 168 hours. No results for input(s): AMMONIA in the last 168 hours. Coagulation Profile: Recent Labs  Lab 05/30/21 1214  INR 1.1   Cardiac Enzymes: No results for input(s): CKTOTAL, CKMB, CKMBINDEX, TROPONINI in the last 168 hours. BNP (last 3 results) No results for input(s): PROBNP in the last 8760 hours. HbA1C: No results for input(s): HGBA1C in the last 72 hours. CBG: No results for input(s): GLUCAP in the last 168 hours. Lipid Profile: No results for input(s): CHOL, HDL, LDLCALC, TRIG, CHOLHDL, LDLDIRECT in the last 72 hours. Thyroid Function Tests: No results for input(s): TSH, T4TOTAL, FREET4, T3FREE, THYROIDAB in the last 72 hours. Anemia Panel: No results for input(s): VITAMINB12, FOLATE, FERRITIN, TIBC, IRON, RETICCTPCT in the last 72 hours. Sepsis Labs: Recent Labs  Lab 05/30/21 1214 05/30/21 1630   PROCALCITON  --  2.28  LATICACIDVEN 1.4  --     Recent Results (from the past 240 hour(s))  Blood Culture (routine x 2)     Status: None (Preliminary result)   Collection Time: 05/30/21 12:00 PM   Specimen: Porta Cath; Blood  Result Value Ref Range Status   Specimen Description   Final    PORTA CATH Performed at [facility], [place] [address], [place], [place] [address]    Special Requests   Final    BOTTLES DRAWN AEROBIC AND ANAEROBIC Blood Culture adequate volume Performed at [facility] [address], [place], [place] [address]    Culture   Final    NO GROWTH < 24 HOURS Performed at [facility] [address] [place], [place] [address]    Report Status PENDING  Incomplete  Blood Culture (routine x 2)     Status: None (Preliminary result)   Collection Time: 05/30/21 12:10 PM   Specimen: BLOOD  Result Value Ref Range Status   Specimen Description   Final    BLOOD LEFT ANTECUBITAL Performed at [facility], [place] [address], [place], [place] [address]    Special Requests   Final    BOTTLES DRAWN AEROBIC AND ANAEROBIC Blood Culture adequate volume Performed at [facility] [address] [place], [place] [address]    Culture   Final    NO GROWTH < 24 HOURS Performed at [facility] [address] [place], [place] [address]    Report Status PENDING  Incomplete  Resp Panel by RT-PCR (Flu A&B, Covid) Nasopharyngeal Swab     Status: None   Collection Time: 05/30/21 12:15 PM   Specimen: Nasopharyngeal Swab; Nasopharyngeal(NP) swabs in vial transport medium  Result Value Ref Range Status   SARS Coronavirus 2 by RT PCR NEGATIVE NEGATIVE Final    Comment: (NOTE) SARS-CoV-2 target nucleic acids are NOT DETECTED.  The SARS-CoV-2 RNA is generally detectable in upper respiratory specimens during the acute phase of infection. The lowest concentration of SARS-CoV-2 viral copies this assay can detect  is 138 copies/mL. A negative result does not preclude SARS-Cov-2 infection and should not be used as the sole basis for  treatment or other patient management decisions. A negative result may occur with  improper specimen collection/handling, submission of specimen other than nasopharyngeal swab, presence of viral mutation(s) within the areas targeted by this assay, and inadequate number of viral copies(<138 copies/mL). A negative result must be combined with clinical observations, patient history, and epidemiological information. The expected result is Negative.  Fact Sheet for Patients:  EntrepreneurPulse.com.au  Fact Sheet for Healthcare Providers:  IncredibleEmployment.be  This test is no t yet approved or cleared by the [place] FDA and  has been authorized for detection and/or diagnosis of SARS-CoV-2 by FDA under an Emergency Use Authorization (EUA). This EUA will remain  in effect (meaning this test can be used) for the duration of the COVID-19 declaration under Section 564(b)(1) of the Act, 21 U.S.C.section 360bbb-3(b)(1), unless the authorization is terminated  or revoked sooner.       Influenza A by PCR NEGATIVE NEGATIVE Final   Influenza B by PCR NEGATIVE NEGATIVE Final    Comment: (NOTE) The Xpert Xpress SARS-CoV-2/FLU/RSV plus assay is intended as an aid in the diagnosis of influenza from Nasopharyngeal swab specimens and should not be used as a sole basis for treatment. Nasal washings and aspirates are unacceptable for Xpert Xpress SARS-CoV-2/FLU/RSV testing.  Fact Sheet for Patients: EntrepreneurPulse.com.au  Fact Sheet for Healthcare Providers: IncredibleEmployment.be  This test is not yet approved or cleared by the [place] FDA and has been authorized for detection and/or diagnosis of SARS-CoV-2 by FDA under an Emergency Use Authorization (EUA). This EUA will remain in effect  (meaning this test can be used) for the duration of the COVID-19 declaration under Section 564(b)(1) of the Act, 21 U.S.C. section 360bbb-3(b)(1), unless the authorization is terminated or revoked.  Performed at [facility], [place] [address], [place], [place] [address]   MRSA PCR Screening     Status: None   Collection Time: 05/31/21  9:21 AM  Result Value Ref Range Status   MRSA by PCR NEGATIVE NEGATIVE Final    Comment:        The GeneXpert MRSA Assay (FDA approved for NASAL specimens only), is one component of a comprehensive MRSA colonization surveillance program. It is not intended to diagnose MRSA infection nor to guide or monitor treatment for MRSA infections. Performed at [facility], [place] [address], [place], [place] [address]          Radiology Studies: US RENAL  Result Date: 05/30/2021 CLINICAL DATA:  Acute kidney injury EXAM: RENAL / URINARY TRACT ULTRASOUND COMPLETE COMPARISON:  None. FINDINGS: Right Kidney: Renal measurements: 10.7 x 4.3 x 4.6 cm = volume: 111 mL. Increased cortical echogenicity. No hydronephrosis or focal abnormality. Left Kidney: Renal measurements: 10.9 x 4.3 x 4.2 cm = volume: 104 mL. Increased cortical echogenicity. No hydronephrosis or focal abnormality. Bladder: Ureteral jets bilaterally.  Normal appearance. Other: No free fluid. IMPRESSION: Increased renal echogenicity compatible with medical renal disease. No acute finding or hydronephrosis. Electronically Signed   By: Jerilynn Mages.  Shick M.D.   On: 05/30/2021 16:51   DG Chest Port 1 View  Result Date: 05/30/2021 CLINICAL DATA:  Fever and shortness of breath EXAM: PORTABLE CHEST 1 VIEW COMPARISON:  02/26/2021 FINDINGS: Cardiac shadow is within normal limits. Mild fullness in the right hilum is noted consistent with the nodule seen on prior PET-CT examination. Lungs are otherwise clear. Minimal scarring is noted in the right base. Postsurgical changes in the axilla  are seen bilaterally. Left chest wall port is  noted in satisfactory position. IMPRESSION: Fullness in the right hilum consistent with the known hilar nodule. No other focal abnormality is seen. Electronically Signed   By: Inez Catalina M.D.   On: 05/30/2021 11:55        Scheduled Meds:  sodium chloride   Intravenous Once   acyclovir  400 mg Oral BID   anastrozole  1 mg Oral Daily   vitamin C  1,000 mg Oral Daily   Chlorhexidine Gluconate Cloth  6 each Topical Daily   ferrous sulfate  325 mg Oral Daily   guaiFENesin  600 mg Oral BID   heparin  5,000 Units Subcutaneous Q8H   multivitamin with minerals  1 tablet Oral Daily   venlafaxine XR  37.5 mg Oral Q breakfast   zinc sulfate  220 mg Oral Daily   Continuous Infusions:  ceFEPime (MAXIPIME) IV 2 g (05/31/21 1027)    sodium bicarbonate (isotonic) infusion in sterile water 125 mL/hr at 05/31/21 0910     LOS: 1 day    Time spent:25 mins. More than 50% of that time was spent in counseling and/or coordination of care.      Shelly Coss, MD [facility] P6/30/2022, 11:42 AM

## 2021-05-31 NOTE — Progress Notes (Signed)
Katie's treatment was delayed because she was mildly hypotensive and had a temperature greater than 101.  She was admitted pancultured and this morning 05/31/2021 is looking better.  She likely will be discharged home with antibiotics.  I have gone over her orders.  She will receive Retacrit and Kadcyla next Thursday, 06/07/2021 assuming we have availability.  She is up for an echo sometime in July and that order has been entered as well.  She will need restaging studies and I am putting her echo in for September.

## 2021-05-31 NOTE — Progress Notes (Signed)
Date and time results received: 05/31/21 0752 (use smartphrase ".now" to insert current time)  Test: Hgb   Critical Value: 6.0  Name of Provider Notified: Adhikari  Orders Received? Or Actions Taken?:  see new orders  Jennah Satchell K, RN

## 2021-05-31 NOTE — Telephone Encounter (Signed)
Scheduled appts per 6/29 sch msg. Pt aware.  

## 2021-05-31 NOTE — Progress Notes (Signed)
[name] [name]   DOB:[birth year]   VU#:[id number]   SHU#:[id number]  Subjective: Uneventful night, rested well, cough down a bit; urinated several times, did not feel dizzy or SOB, urine looked straw colored to her; husband Vicente Males in room   Objective: African American woman examined in bed Vitals:   05/30/21 2335 05/31/21 0417  BP: (!) 98/46 (!) 105/57  Pulse: 79 83  Resp: 18 16  Temp: 98.8 F (37.1 C) 99.5 F (37.5 C)  SpO2: 96% 93%    Body mass index is 35.84 kg/m.  Intake/Output Summary (Last 24 hours) at 05/31/2021 0736 Last data filed at 05/31/2021 0300 Gross per 24 hour  Intake 601.81 ml  Output --  Net 601.81 ml     Lungs no rales or wheezes--auscultated anterolaterally  Heart regular rate and rhythm  Abdomen soft, +BS  Neuro nonfocal  Breast exam: deferred  CBG (last 3)  No results for input(s): GLUCAP in the last 72 hours.   Labs:  Lab Results  Component Value Date   WBC 12.8 (H) 05/30/2021   HGB 7.3 (L) 05/30/2021   HCT 22.0 (L) 05/30/2021   MCV 85.9 05/30/2021   PLT 107 (L) 05/30/2021   NEUTROABS 10.7 (H) 05/30/2021    '@LASTCHEMISTRY'$ @  Urine Studies No results for input(s): UHGB, CRYS in the last 72 hours.  Invalid input(s): UACOL, UAPR, USPG, UPH, UTP, UGL, UKET, UBIL, UNIT, UROB, ULEU, UEPI, UWBC, URBC, UBAC, CAST, UCOM, [place]  Basic Metabolic Panel: Recent Labs  Lab 05/30/21 0917 05/31/21 0615  NA 131* 134*  K 5.3* 4.5  CL 108 107  CO2 13* 18*  GLUCOSE 137* 109*  BUN 65* 63*  CREATININE 2.77* 2.43*  CALCIUM 11.3* 10.1   GFR Estimated Creatinine Clearance: 25.7 mL/min (A) (by C-G formula based on SCr of 2.43 mg/dL (H)). Liver Function Tests: Recent Labs  Lab 05/30/21 0917 05/31/21 0615  AST 96* 65*  ALT 56* 40  ALKPHOS 163* 116  BILITOT 1.8* 1.4*  PROT 7.7 6.3*  ALBUMIN 2.8* 2.6*   No results for input(s): LIPASE, AMYLASE in the last 168 hours. No results for input(s): AMMONIA in the last 168 hours. Coagulation profile Recent  Labs  Lab 05/30/21 1214  INR 1.1    CBC: Recent Labs  Lab 05/30/21 0917  WBC 12.8*  NEUTROABS 10.7*  HGB 7.3*  HCT 22.0*  MCV 85.9  PLT 107*   Cardiac Enzymes: No results for input(s): CKTOTAL, CKMB, CKMBINDEX, TROPONINI in the last 168 hours. BNP: Invalid input(s): POCBNP CBG: No results for input(s): GLUCAP in the last 168 hours. D-Dimer No results for input(s): DDIMER in the last 72 hours. Hgb A1c No results for input(s): HGBA1C in the last 72 hours. Lipid Profile No results for input(s): CHOL, HDL, LDLCALC, TRIG, CHOLHDL, LDLDIRECT in the last 72 hours. Thyroid function studies No results for input(s): TSH, T4TOTAL, T3FREE, THYROIDAB in the last 72 hours.  Invalid input(s): FREET3 Anemia work up No results for input(s): VITAMINB12, FOLATE, FERRITIN, TIBC, IRON, RETICCTPCT in the last 72 hours. Microbiology Recent Results (from the past 240 hour(s))  Blood Culture (routine x 2)     Status: None (Preliminary result)   Collection Time: 05/30/21 12:00 PM   Specimen: Porta Cath; Blood  Result Value Ref Range Status   Specimen Description   Final    PORTA CATH Performed at [facility], [place] [address], [place], [place] [address]    Special Requests   Final    BOTTLES DRAWN AEROBIC  AND ANAEROBIC Blood Culture adequate volume Performed at [facility] [address], [place], [place] [address]    Culture   Final    NO GROWTH < 24 HOURS Performed at [facility] [address], [place], [place] [address]    Report Status PENDING  Incomplete  Blood Culture (routine x 2)     Status: None (Preliminary result)   Collection Time: 05/30/21 12:10 PM   Specimen: BLOOD  Result Value Ref Range Status   Specimen Description   Final    BLOOD LEFT ANTECUBITAL Performed at [facility], [place] [address], [place], [place] [address]    Special Requests   Final    BOTTLES DRAWN AEROBIC AND ANAEROBIC Blood Culture adequate  volume Performed at [facility] [address] [place], [place] [address]    Culture   Final    NO GROWTH < 24 HOURS Performed at [facility] [address], [place],  [address]    Report Status PENDING  Incomplete  Resp Panel by RT-PCR (Flu A&B, Covid) Nasopharyngeal Swab     Status: None   Collection Time: 05/30/21 12:15 PM   Specimen: Nasopharyngeal Swab; Nasopharyngeal(NP) swabs in vial transport medium  Result Value Ref Range Status   SARS Coronavirus 2 by RT PCR NEGATIVE NEGATIVE Final    Comment: (NOTE) SARS-CoV-2 target nucleic acids are NOT DETECTED.  The SARS-CoV-2 RNA is generally detectable in upper respiratory specimens during the acute phase of infection. The lowest concentration of SARS-CoV-2 viral copies this assay can detect is 138 copies/mL. A negative result does not preclude SARS-Cov-2 infection and should not be used as the sole basis for treatment or other patient management decisions. A negative result may occur with  improper specimen collection/handling, submission of specimen other than nasopharyngeal swab, presence of viral mutation(s) within the areas targeted by this assay, and inadequate number of viral copies(<138 copies/mL). A negative result must be combined with clinical observations, patient history, and epidemiological information. The expected result is Negative.  Fact Sheet for Patients:  EntrepreneurPulse.com.au  Fact Sheet for Healthcare Providers:  IncredibleEmployment.be  This test is no t yet approved or cleared by the [place] FDA and  has been authorized for detection and/or diagnosis of SARS-CoV-2 by FDA under an Emergency Use Authorization (EUA). This EUA will remain  in effect (meaning this test can be used) for the duration of the COVID-19 declaration under Section 564(b)(1) of the Act, 21 U.S.C.section 360bbb-3(b)(1), unless the authorization is  terminated  or revoked sooner.       Influenza A by PCR NEGATIVE NEGATIVE Final   Influenza B by PCR NEGATIVE NEGATIVE Final    Comment: (NOTE) The Xpert Xpress SARS-CoV-2/FLU/RSV plus assay is intended as an aid in the diagnosis of influenza from Nasopharyngeal swab specimens and should not be used as a sole basis for treatment. Nasal washings and aspirates are unacceptable for Xpert Xpress SARS-CoV-2/FLU/RSV testing.  Fact Sheet for Patients: EntrepreneurPulse.com.au  Fact Sheet for Healthcare Providers: IncredibleEmployment.be  This test is not yet approved or cleared by the [place] FDA and has been authorized for detection and/or diagnosis of SARS-CoV-2 by FDA under an Emergency Use Authorization (EUA). This EUA will remain in effect (meaning this test can be used) for the duration of the COVID-19 declaration under Section 564(b)(1) of the Act, 21 U.S.C. section 360bbb-3(b)(1), unless the authorization is terminated or revoked.  Performed at [facility], [address] [place], [place] [address]       Studies:  US RENAL  Result Date: 06/13/21 CLINICAL DATA:  Acute kidney injury EXAM: RENAL / URINARY TRACT ULTRASOUND COMPLETE COMPARISON:  None. FINDINGS: Right Kidney: Renal measurements: 10.7 x 4.3 x 4.6 cm = volume: 111 mL. Increased cortical echogenicity. No hydronephrosis or focal abnormality. Left Kidney: Renal measurements: 10.9 x 4.3 x 4.2 cm = volume: 104 mL. Increased cortical echogenicity. No hydronephrosis or focal abnormality. Bladder: Ureteral jets bilaterally.  Normal appearance. Other: No free fluid. IMPRESSION: Increased renal echogenicity compatible with medical renal disease. No acute finding or hydronephrosis. Electronically Signed   By: Jerilynn Mages.  Shick M.D.   On: Jun 13, 2021 16:51   DG Chest Port 1 View  Result Date: 06-13-21 CLINICAL DATA:  Fever and shortness of breath EXAM: PORTABLE CHEST 1  VIEW COMPARISON:  02/26/2021 FINDINGS: Cardiac shadow is within normal limits. Mild fullness in the right hilum is noted consistent with the nodule seen on prior PET-CT examination. Lungs are otherwise clear. Minimal scarring is noted in the right base. Postsurgical changes in the axilla are seen bilaterally. Left chest wall port is noted in satisfactory position. IMPRESSION: Fullness in the right hilum consistent with the known hilar nodule. No other focal abnormality is seen. Electronically Signed   By: Inez Catalina M.D.   On: 2021-06-13 11:55    Assessment: 50 y.o. [place], [place] woman   (1) status post left lumpectomy in 2000 for a (?) T2N0 breast cancer,             (a) status post adjuvant chemotherapy             (b) status post adjuvant radiation             (c) did not receive antiestrogens   (2) genetics testing May 18, 2018 through the common Hereditary Cancers Panel + Myelodysplastic Syndrome/Leukemia Panel found no deleterious mutations in APC, ATM, AXIN2, BARD1, BLM, BMPR1A, BRCA1, BRCA2, BRIP1, CDH1, CDK4, CDKN2A (p14ARF), CDKN2A (p16INK4a), CEBPA, CHEK2, CTNNA1, DICER1, EPCAM*, GATA2, GREM1*, HRAS, KIT, MEN1, MLH1, MSH2, MSH3, MSH6, MUTYH, NBN, NF1, PALB2, PDGFRA, PMS2, POLD1, POLE, PTEN, RAD50, RAD51C, RAD51D, RUNX1, SDHB, SDHC, SDHD, SMAD4, SMARCA4, STK11, TERC, TERT, TP53, TSC1, TSC2, VHL The following genes were evaluated for sequence changes only: HOXB13*, NTHL1*, SDHA             (a)  3 variants of uncertain significance were identified in the genes BARD1 c.764A>G (p.Asn255Ser), BRIP1 c.2563C>T (p.Arg855Cys), and PALB2 c.3103A>T (p.Ile1035Phe).   (3) status post right breast biopsy 03/16/2018 for a clinical mT2-3 N2, anatomic stage III invasive ductal carcinoma, grade 2, triple positive, with an MIB-1 of 80%.             (a) bone scan and chest CT scan negative for metastases except for a T6 lytic lesion of concern             (b) thoracic spine MRI was ordered May 2019 but never  performed.   (4) neoadjuvant chemotherapy consisting of carboplatin, docetaxel, trastuzumab and Pertuzumab every 3 weeks x 6 starting 04/16/2018             (a) pertuzumab held beginning with cycle 2 because of diarrhea   (5) trastuzumab was to be continued Q4w indefinitely, discontinued after 10/21/2019 dose with progression             (a) echocardiogram on 06/29/2018 showed an ejection fraction in the 65-70% range             (  b) echocardiogram 11/19/2018 showed an ejection fraction in the 60-65% range             (c) echo 04/07/2019 shows an ejection fraction in the 55-60% range             (d) echo 07/15/2019 shows EF of 60-65%--for additional echoes see under #12 below   (6) right mastectomy and sentinel lymph node sampling on 08/26/2018 found a residual 2.5cm invasive ductal carcinoma, with 2 of 5 sentinel lymph nodes positive, ypT2, ypN1a; margins were clear             (a) repeat prognostic panel confirms tumor still triple positive (HER2 3+)   (7) adjuvant radiation 09/30/2018 - 11/16/2018  Site/dose:   The patient initially received a dose of 50.4 Gy in 28 fractions to the right chest wall and supraclavicular region. This was delivered using a 3-D conformal, 4 field technique. The patient then received a boost to the mastectomy scar. This delivered an additional 10 Gy in 5 fractions using an en face electron field. The total dose was 60.4   (8) anastrozole started 12/03/2018             (a) [place] on 10/01/2018 was 64.3, and estradiol 7.0, consistent with menopause             (b) referral to pelvic floor rehabilitation 12/03/2018             (c) [place] on 03/16/2020 was 80.8 with estradiol less than 2.5              METASTATIC DISEASE: March 2020 (9) CT of neck and CT angiography of the chest 02/04/2019 finds the T6 lytic lesion noted May 2019 (see #3 above) is now sclerotic; a sclerotic T3 lesion is stable; there is a new lytic lesion in the manubrium             (a) PET scan  03/12/2019 shows manubrium, T3 and T5 metastases, no visceral disease             (b) biopsy of manubrial lesion planned but not done secondary to pandemic             (c) Bone scan 09/01/19 shows ? progression of disease in spine; MRI 09/02/19 shows early metastases in T10 (new), T3 and T5 stable; CT chest 09/01/19 shows 53mm pulmonary nodules that are new, but nonspecific and warrant future follow up             (d) PET 10/26/2019 shows no lung or liver lesions; new mediastinal adenopathy, bone lesions-- T-DM1 started             (e) PET scan 03/08/2020 shows resolution of the mediastinal adenopathy and hypermetabolic bone lesions, questionable subcutaneous nodule along the left lateral shoulder             (f) for additional staging studies see under "12" below   (10) SRS to spine, palliative treatment to sternum, from 05/13/2019 through 05/26/2019: 1. Chest_sternum // 40 Gy in 10 fractions 2. Thoracic Spine (T3) // 18 Gy in 1 fraction   3. Thoracic Spine (T5) // 18 Gy in 1 fraction    (11) denosumab/Xgeva started 09/08/2019, currently given every 6 weeks   (12) T-DM1 started 01/15/20201             (a) echo 10/26/2019 shows stable EF at 55-60%             (b) echo 03/08/2020 shows an ejection  fraction in the 70-75% range             (c) echo 07/12/2020 shows an ejection fraction in the 65 to 70% range.             (d) PET scan 03/08/2020 shows no active disease; a 0.9 cm subcutaneous nodule in the left shoulder area seen on the PET was not identifiable by exam 03/16/2020             (e) PET 08/24/2020 shows stable disease except for a new 0.6 cm lymph node with an SUV of 10.1             (f) bronchoscopy 09/05/2020 with endobronchial ultrasound found no detectable mass in the region noted on the September PET scan             (g) T-DM1 discontinued and trastuzumab resumed 11/29/2020             (h) PET scan 02/26/2021 shows mild nodular increase: TDM-1resumed 03/07/2021    Plan: Labs from  this AM pending. Would consider transfusion if Hb significantly decreased with hydration but she would prefer to avoid transfusions and currently is asymptomatic. We will resume retacrit when she returns to the office 07/07. Assuming cultures prove negative will also resume her anti-HER2 therapy on that date.   We discussed the importance of hydration and how she can monitor how much fluid she drinks, daily goal being 1.5 L+. I will also refer to renal as outpatient for further management.  She is due for repeat echo and restaging PET and I will set those up for her as outpatient. Thank you again for yur help to this patient. May discharge her at your discretion.   Chauncey Cruel, MD 05/31/2021  7:36 AM Medical Oncology and Hematology [facility] [address] [place], [place] [address] Tel. [phone]    Fax. [phone]

## 2021-06-01 ENCOUNTER — Inpatient Hospital Stay (HOSPITAL_COMMUNITY): Payer: BC Managed Care – PPO

## 2021-06-01 ENCOUNTER — Telehealth: Payer: Self-pay | Admitting: Oncology

## 2021-06-01 LAB — BPAM RBC
Blood Product Expiration Date: 202208032359
Blood Product Expiration Date: 202208032359
ISSUE DATE / TIME: 202206301404
ISSUE DATE / TIME: 202206301659
Unit Type and Rh: 5100
Unit Type and Rh: 5100

## 2021-06-01 LAB — CBC WITH DIFFERENTIAL/PLATELET
Abs Immature Granulocytes: 0.04 10*3/uL (ref 0.00–0.07)
Basophils Absolute: 0 10*3/uL (ref 0.0–0.1)
Basophils Relative: 1 %
Eosinophils Absolute: 0.2 10*3/uL (ref 0.0–0.5)
Eosinophils Relative: 2 %
HCT: 24.7 % — ABNORMAL LOW (ref 36.0–46.0)
Hemoglobin: 8.1 g/dL — ABNORMAL LOW (ref 12.0–15.0)
Immature Granulocytes: 1 %
Lymphocytes Relative: 17 %
Lymphs Abs: 1.3 10*3/uL (ref 0.7–4.0)
MCH: 29.1 pg (ref 26.0–34.0)
MCHC: 32.8 g/dL (ref 30.0–36.0)
MCV: 88.8 fL (ref 80.0–100.0)
Monocytes Absolute: 1.4 10*3/uL — ABNORMAL HIGH (ref 0.1–1.0)
Monocytes Relative: 19 %
Neutro Abs: 4.5 10*3/uL (ref 1.7–7.7)
Neutrophils Relative %: 60 %
Platelets: 78 10*3/uL — ABNORMAL LOW (ref 150–400)
RBC: 2.78 MIL/uL — ABNORMAL LOW (ref 3.87–5.11)
RDW: 18.3 % — ABNORMAL HIGH (ref 11.5–15.5)
WBC: 7.5 10*3/uL (ref 4.0–10.5)
nRBC: 0.3 % — ABNORMAL HIGH (ref 0.0–0.2)

## 2021-06-01 LAB — COMPREHENSIVE METABOLIC PANEL
ALT: 37 U/L (ref 0–44)
AST: 64 U/L — ABNORMAL HIGH (ref 15–41)
Albumin: 2.5 g/dL — ABNORMAL LOW (ref 3.5–5.0)
Alkaline Phosphatase: 115 U/L (ref 38–126)
Anion gap: 7 (ref 5–15)
BUN: 54 mg/dL — ABNORMAL HIGH (ref 6–20)
CO2: 22 mmol/L (ref 22–32)
Calcium: 9.8 mg/dL (ref 8.9–10.3)
Chloride: 105 mmol/L (ref 98–111)
Creatinine, Ser: 2.01 mg/dL — ABNORMAL HIGH (ref 0.44–1.00)
GFR, Estimated: 30 mL/min — ABNORMAL LOW (ref >60)
Glucose, Bld: 88 mg/dL (ref 70–99)
Potassium: 4.4 mmol/L (ref 3.5–5.1)
Sodium: 134 mmol/L — ABNORMAL LOW (ref 135–145)
Total Bilirubin: 1.9 mg/dL — ABNORMAL HIGH (ref 0.3–1.2)
Total Protein: 6.3 g/dL — ABNORMAL LOW (ref 6.5–8.1)

## 2021-06-01 LAB — URINE CULTURE

## 2021-06-01 LAB — TYPE AND SCREEN
ABO/RH(D): O POS
Antibody Screen: POSITIVE
DAT, IgG: NEGATIVE
Donor AG Type: NEGATIVE
Donor AG Type: NEGATIVE
PT AG Type: NEGATIVE
Unit division: 0
Unit division: 0

## 2021-06-01 LAB — LIPASE, BLOOD: Lipase: 203 U/L — ABNORMAL HIGH (ref 11–51)

## 2021-06-01 MED ORDER — SODIUM CHLORIDE 0.9 % IV SOLN
2.0000 g | Freq: Two times a day (BID) | INTRAVENOUS | Status: DC
  Administered 2021-06-01 – 2021-06-04 (×8): 2 g via INTRAVENOUS
  Filled 2021-06-01 (×10): qty 2

## 2021-06-01 NOTE — Progress Notes (Signed)
Pharmacy Antibiotic Note  [name] [name] is a 50 y.o. female admitted on 05/30/2021 with sepsis.  Pharmacy has been consulted for cefepime dosing.  PMH significant for metastatic breast cancer, CKD baseline SCr ~1.6-1.9. Admitted with sepsis, AKI.   Today, 06/01/21 WBC improved to WNL SCr 2.01, improving. CrCl ~31 mL/min Afebrile  Plan: Increase cefepime to 2 g IV q12h Monitor renal function, culture data.   Height: 4\' 11"  (149.9 cm) Weight: 80.5 kg (177 lb 7.5 oz) IBW/kg (Calculated) : 43.2  Temp (24hrs), Avg:99.2 F (37.3 C), Min:99 F (37.2 C), Max:99.8 F (37.7 C)  Recent Labs  Lab 05/30/21 0917 05/30/21 1214 05/31/21 0615 06/01/21 0421  WBC 12.8*  --  9.2 7.5  CREATININE 2.77*  --  2.43* 2.01*  LATICACIDVEN  --  1.4  --   --      Estimated Creatinine Clearance: 31.1 mL/min (A) (by C-G formula based on SCr of 2.01 mg/dL (H)).    Allergies  Allergen Reactions   Ace Inhibitors Swelling    Swelling of lips and face   Lisinopril Swelling    Swelling of lips, face   Amlodipine Swelling    Leg swelling   Shellfish Allergy Swelling   Adhesive [Tape] Itching and Rash    Please use paper tape   Latex Itching and Rash    Antimicrobials this admission: vancomycin 6/29 >> 6/30 cefepime 6/29 >>   Dose adjustments this admission:  Microbiology results: 6/29 BCx: NGTD 6/29 UCx: Multiple species, suggest recollection   Thank you for allowing pharmacy to be a part of this patient's care.  Lenis Noon, PharmD 06/01/2021 9:15 AM

## 2021-06-01 NOTE — Progress Notes (Signed)
PROGRESS NOTE    [name] [name]  EVO:[id number] DOB: 05-26-1971 DOA: 05/30/2021 PCP: Marda Stalker, PA-C   Chief Complain: Generalized weakness   Brief Narrative: [name] [name] is a 50 y.o. female with medical history significant of right-sided triple positive metastatic breast cancer, TIA, morbid obesity, hypercalcemia, thrombocytopenia, CKD stage IV, hypertension who was referred to the emergency department from cancer center when she was found to have fever, low blood pressure.  Patient is on chemotherapy for breast cancer. When she presented at cancer center, she was febrile with fever of 101 F, blood pressure was low with systolic of 29H.  Patient was also noticed to be mildly tachypneic.  Lab work showed AKI.  Culture sent.  Patient admitted for the suspicion of sepsis without any clear source.  Started on broad-spectrum antibiotics.Hospital Course also remarkable for elevated lipase level.   Assessment & Plan:   Active Problems:   Malignant neoplasm of upper-outer quadrant of right breast in female, estrogen receptor positive ([place])   Essential hypertension   Morbid obesity with BMI of 40.0-44.9, adult (HCC)   Anemia associated with stage 4 chronic renal failure (HCC)   Sepsis (HCC)   CKD (chronic kidney disease), stage IV ([place])   Sepsis: Presented with generalized weakness, hypotension.  Lactic acid level normal.  Patient is immunocompromised, started  on broad-spectrum antibiotics with vancomycin and cefepime.  Cultures,NGTD.   Complains of productive cough but chest x-ray did not show any pneumonia.  MRSA PCR negative,so discontinued vancomycin .Patient had mild leukocytosis,now resolved.  Had elevated procalcitonin.  Source of infection could not be identified.  Antibiotics will be changed to oral on discharge.     AKI on CKD stage IV: Does not follow with nephrology.  Baseline creatinine in the range of 1.6-1.9.  Patient looked dehydrated on admission, started on IV fluid  rehydration .  Renal ultrasound showed decreased echogenicity consistent with medical renal disease, no hydronephrosis.  Kidney function currently at baseline.   Chronic normocytic anemia: Associated with CKD, malignancy, chemotherapy and iron deficiency.  Her baseline  hemoglobin is in the range of 8.  She takes iron supplements.  Hemoglobin dropped to the range of 6 , transfused with 2 units of PRBC.  Currently hemoglobin stable in the range of 8.   Non-anion gap metabolic acidosis: Most likely secondary to renal insufficiency.  Treated with isotonic fluid with bicarb.  Continue oral bicarb supplementation on discharge   Thrombocytopenia: Chronic.  Likely associated  with malignancy/chemotherapy.   Metastatic right-sided breast cancer: Follows with Dr. Jana Hakim, currently on chemotherapy and anastrozole.  Last chemo on 6 June.  She has triple positive breast cancer.  She is status postlumpectomy, chemotherapy and radiation.  There was concern of T6 lytic lesion for possible metastatic disease.  Bone scan done on 9/20 showed progression of the disease in the spine. CT chest without contrast showed increased right infrahilar opacity is noted extending into the lower lobe which may represent inflammation or possibly malignancy.Also showed new sclerotic focus posteriorly in T8 vertebral body concerning for metastatic disease.Larger sclerotic density is noted posteriorly in T10 vertebral body concerning for metastatic disease.I discussed these findings with [name] and he recommended outpatient follow up.  Questionable pancreatitis:CT also showed enlargement of pancreatic head with possible surrounding inflammation ,lipase level is in the range of 200s.She denies abd pain but has some nausea. Will keep her on full liquid diet for today.Restart IV fluids,check lipase level tomorrow   Hypercalcemia: Improved.Most likely secondary to malignancy.  Elevated liver enzymes: Chronic.  Most likely associated   with malignancy / chemotherapy.  Also has elevated ALP.  All improved   Anxiety: On venlafaxine   Hypertension: Takes losartan, carvedilol, spironolactone-hydrochlorothiazide at home, these are on hold due to severe hypotension, AKI on presentation.  Her blood pressure has remained stable without any medications.  Continue to monitor without medications at home.   Hemoptysis: Not active bleeding.  Patient reports some blood seen in her sputum.  CT chest without contrast did not show any obvious pathology for hemoptysis               DVT prophylaxis:Heparin [place] Code Status: Full Family Communication: Daughter  at bedside Status is: Inpatient  Remains inpatient appropriate because:Inpatient level of care appropriate due to severity of illness  Dispo: The patient is from: Home              Anticipated d/c is to: Home              Patient currently is not medically stable to d/c.   Difficult to place patient No     Consultants: Oncology  Procedures:None  Antimicrobials:  Anti-infectives (From admission, onward)    Start     Dose/Rate Route Frequency Ordered Stop   06/01/21 1400  vancomycin (VANCOREADY) IVPB 750 mg/150 mL  Status:  Discontinued        750 mg 150 mL/hr over 60 Minutes Intravenous Every 48 hours 05/30/21 1627 05/31/21 1141   06/01/21 1200  ceFEPIme (MAXIPIME) 2 g in sodium chloride 0.9 % 100 mL IVPB        2 g 200 mL/hr over 30 Minutes Intravenous Every 12 hours 06/01/21 0914     05/31/21 1200  ceFEPIme (MAXIPIME) 2 g in sodium chloride 0.9 % 100 mL IVPB  Status:  Discontinued        2 g 200 mL/hr over 30 Minutes Intravenous Every 24 hours 05/30/21 1626 06/01/21 0914   05/30/21 2200  acyclovir (ZOVIRAX) tablet 400 mg        400 mg Oral 2 times daily 05/30/21 1549     05/30/21 1145  vancomycin (VANCOREADY) IVPB 1250 mg/250 mL        1,250 mg 166.7 mL/hr over 90 Minutes Intravenous  Once 05/30/21 1138 05/30/21 1840   05/30/21 1130  ceFEPIme (MAXIPIME) 2 g  in sodium chloride 0.9 % 100 mL IVPB        2 g 200 mL/hr over 30 Minutes Intravenous  Once 05/30/21 1129 05/30/21 1258   05/30/21 1130  metroNIDAZOLE (FLAGYL) IVPB 500 mg        500 mg 100 mL/hr over 60 Minutes Intravenous  Once 05/30/21 1129 05/30/21 1432   05/30/21 1130  vancomycin (VANCOCIN) IVPB 1000 mg/200 mL premix  Status:  Discontinued        1,000 mg 200 mL/hr over 60 Minutes Intravenous  Once 05/30/21 1129 05/30/21 1138       Subjective:  Patient seen and examined at the bedside this morning.She says she feels alright but complains of pain on the right lower chest.Complains of blood tinged sputum  Objective: Vitals:   05/31/21 1730 05/31/21 1943 06/01/21 0329 06/01/21 1233  BP: 134/75 126/65 116/69 129/71  Pulse: 76 81 81 76  Resp: (!) 25 (!) 21 15 19   Temp: 99.2 F (37.3 C) 99.3 F (37.4 C) 99.2 F (37.3 C) 98.5 F (36.9 C)  TempSrc: Oral Oral Oral Oral  SpO2: 100% 97% 92% 96%  Weight:      Height:        Intake/Output Summary (Last 24 hours) at 06/01/2021 1708 Last data filed at 06/01/2021 1450 Gross per 24 hour  Intake 3910.14 ml  Output --  Net 3910.14 ml   Filed Weights   05/30/21 1039 05/30/21 1949  Weight: 78.9 kg 80.5 kg    Examination:  General exam: Overall comfortable, not in distress HEENT: PERRL Respiratory system:  no wheezes or crackles  Cardiovascular system: S1 & S2 heard, RRR. Chemo port Gastrointestinal system: Abdomen is nondistended, soft and nontender. Central nervous system: Alert and oriented Extremities: No edema, no clubbing ,no cyanosis Skin: No rashes, no ulcers,no icterus     Data Reviewed: I have personally reviewed following labs and imaging studies  CBC: Recent Labs  Lab 05/30/21 0917 05/31/21 0615 06/01/21 0421  WBC 12.8* 9.2 7.5  NEUTROABS 10.7*  --  4.5  HGB 7.3* 6.0* 8.1*  HCT 22.0* 18.8* 24.7*  MCV 85.9 90.4 88.8  PLT 107* 87* 78*   Basic Metabolic Panel: Recent Labs  Lab 05/30/21 0917  05/31/21 0615 06/01/21 0421  NA 131* 134* 134*  K 5.3* 4.5 4.4  CL 108 107 105  CO2 13* 18* 22  GLUCOSE 137* 109* 88  BUN 65* 63* 54*  CREATININE 2.77* 2.43* 2.01*  CALCIUM 11.3* 10.1 9.8   GFR: Estimated Creatinine Clearance: 31.1 mL/min (A) (by C-G formula based on SCr of 2.01 mg/dL (H)). Liver Function Tests: Recent Labs  Lab 05/30/21 0917 05/31/21 0615 06/01/21 0421  AST 96* 65* 64*  ALT 56* 40 37  ALKPHOS 163* 116 115  BILITOT 1.8* 1.4* 1.9*  PROT 7.7 6.3* 6.3*  ALBUMIN 2.8* 2.6* 2.5*   Recent Labs  Lab 06/01/21 1523  LIPASE 203*   No results for input(s): AMMONIA in the last 168 hours. Coagulation Profile: Recent Labs  Lab 05/30/21 1214  INR 1.1   Cardiac Enzymes: No results for input(s): CKTOTAL, CKMB, CKMBINDEX, TROPONINI in the last 168 hours. BNP (last 3 results) No results for input(s): PROBNP in the last 8760 hours. HbA1C: No results for input(s): HGBA1C in the last 72 hours. CBG: No results for input(s): GLUCAP in the last 168 hours. Lipid Profile: No results for input(s): CHOL, HDL, LDLCALC, TRIG, CHOLHDL, LDLDIRECT in the last 72 hours. Thyroid Function Tests: No results for input(s): TSH, T4TOTAL, FREET4, T3FREE, THYROIDAB in the last 72 hours. Anemia Panel: No results for input(s): VITAMINB12, FOLATE, FERRITIN, TIBC, IRON, RETICCTPCT in the last 72 hours. Sepsis Labs: Recent Labs  Lab 05/30/21 1214 05/30/21 1630  PROCALCITON  --  2.28  LATICACIDVEN 1.4  --     Recent Results (from the past 240 hour(s))  Urine culture     Status: Abnormal   Collection Time: 05/30/21 11:53 AM   Specimen: In/Out Cath Urine  Result Value Ref Range Status   Specimen Description   Final    IN/OUT CATH URINE Performed at [facility] [address], [place], [place] [address]    Special Requests   Final    NONE Performed at [facility], [place] [address] [place], [place] [address]    Culture MULTIPLE SPECIES  PRESENT, SUGGEST RECOLLECTION (A)  Final   Report Status 06/01/2021 FINAL  Final  Blood Culture (routine x 2)     Status: None (Preliminary result)   Collection Time: 05/30/21 12:00 PM   Specimen: Porta Cath; Blood  Result Value Ref Range Status   Specimen  Description   Final    PORTA CATH Performed at [facility], [place] [address] [place], [place] [address]    Special Requests   Final    BOTTLES DRAWN AEROBIC AND ANAEROBIC Blood Culture adequate volume Performed at [facility] [address], [place], [place] [address]    Culture   Final    NO GROWTH 2 DAYS Performed at [facility] [address], [place], [place] [address]    Report Status PENDING  Incomplete  Blood Culture (routine x 2)     Status: None (Preliminary result)   Collection Time: 05/30/21 12:10 PM   Specimen: BLOOD  Result Value Ref Range Status   Specimen Description   Final    BLOOD LEFT ANTECUBITAL Performed at [facility], [place] [address] [place], [place] [address]    Special Requests   Final    BOTTLES DRAWN AEROBIC AND ANAEROBIC Blood Culture adequate volume Performed at [facility] [address] [place], [place] [address]    Culture   Final    NO GROWTH 2 DAYS Performed at [facility] [address], [place], [place] [address]    Report Status PENDING  Incomplete  Resp Panel by RT-PCR (Flu A&B, Covid) Nasopharyngeal Swab     Status: None   Collection Time: 05/30/21 12:15 PM   Specimen: Nasopharyngeal Swab; Nasopharyngeal(NP) swabs in vial transport medium  Result Value Ref Range Status   SARS Coronavirus 2 by RT PCR NEGATIVE NEGATIVE Final    Comment: (NOTE) SARS-CoV-2 target nucleic acids are NOT DETECTED.  The SARS-CoV-2 RNA is generally detectable in upper respiratory specimens during the acute phase of infection. The lowest concentration of SARS-CoV-2 viral copies this assay can detect is 138 copies/mL. A  negative result does not preclude SARS-Cov-2 infection and should not be used as the sole basis for treatment or other patient management decisions. A negative result may occur with  improper specimen collection/handling, submission of specimen other than nasopharyngeal swab, presence of viral mutation(s) within the areas targeted by this assay, and inadequate number of viral copies(<138 copies/mL). A negative result must be combined with clinical observations, patient history, and epidemiological information. The expected result is Negative.  Fact Sheet for Patients:  EntrepreneurPulse.com.au  Fact Sheet for Healthcare Providers:  IncredibleEmployment.be  This test is no t yet approved or cleared by the [place] FDA and  has been authorized for detection and/or diagnosis of SARS-CoV-2 by FDA under an Emergency Use Authorization (EUA). This EUA will remain  in effect (meaning this test can be used) for the duration of the COVID-19 declaration under Section 564(b)(1) of the Act, 21 U.S.C.section 360bbb-3(b)(1), unless the authorization is terminated  or revoked sooner.       Influenza A by PCR NEGATIVE NEGATIVE Final   Influenza B by PCR NEGATIVE NEGATIVE Final    Comment: (NOTE) The Xpert Xpress SARS-CoV-2/FLU/RSV plus assay is intended as an aid in the diagnosis of influenza from Nasopharyngeal swab specimens and should not be used as a sole basis for treatment. Nasal washings and aspirates are unacceptable for Xpert Xpress SARS-CoV-2/FLU/RSV testing.  Fact Sheet for Patients: EntrepreneurPulse.com.au  Fact Sheet for Healthcare Providers: IncredibleEmployment.be  This test is not yet approved or cleared by the [place] FDA and has been authorized for detection and/or diagnosis of SARS-CoV-2 by FDA under an Emergency Use Authorization (EUA). This EUA will remain in effect (meaning this test can  be used) for the duration of the COVID-19 declaration under Section 564(b)(1) of the Act, 21 U.S.C. section 360bbb-3(b)(1), unless the authorization is terminated or revoked.  Performed at [facility], [place] [address], [place], [place] [address]   MRSA PCR Screening     Status: None   Collection Time: 05/31/21  9:21 AM  Result Value Ref Range Status   MRSA by PCR NEGATIVE NEGATIVE Final    Comment:        The GeneXpert MRSA Assay (FDA approved for NASAL specimens only), is one component of a comprehensive MRSA colonization surveillance program. It is not intended to diagnose MRSA infection nor to guide or monitor treatment for MRSA infections. Performed at [facility], [place] [address] [place], [place] [address]          Radiology Studies: CT CHEST WO CONTRAST  Result Date: 06/01/2021 CLINICAL DATA:  Hemoptysis.  History of metastatic breast cancer. EXAM: CT CHEST WITHOUT CONTRAST TECHNIQUE: Multidetector CT imaging of the chest was performed following the standard protocol without IV contrast. COMPARISON:  September 01, 2019. FINDINGS: Cardiovascular: Mild cardiomegaly is noted. No evidence of thoracic aortic aneurysm. No pericardial effusion. Mediastinum/Nodes: Thyroid gland and esophagus are unremarkable. Mildly increased adenopathy is noted in the aortopulmonary window with the largest lymph node measuring 1 cm. This is concerning for possible metastatic disease. Surgical clips are noted in both axillary regions. 11 mm lymph node is noted adjacent to distal descending thoracic aorta. Lungs/Pleura: No pneumothorax or pleural effusion is noted. Mild left posterior basilar subsegmental atelectasis is noted. Increased right apical densities are noted concerning for worsening inflammation or possibly scarring. Increased right infrahilar opacity is noted extending into the lower lobe which may represent inflammation or possibly malignancy, although  evaluation is limited due to the lack of intravenous contrast. Mild opacity is noted posteriorly in the right lower lobe concerning for atelectasis or possibly infiltrate. Upper Abdomen: Enlargement of the pancreatic head is noted with possible surrounding inflammation. Musculoskeletal: Stable sclerotic density seen in T6 vertebral body concerning for metastatic disease. New sclerotic focus is noted posteriorly in T8 vertebral body concerning for metastatic disease. Larger sclerotic density is noted posteriorly in T10 vertebral body concerning for metastatic disease. IMPRESSION: Mildly increased adenopathy is noted in aortopulmonary window of mediastinum with largest lymph node measuring 1 cm. 11 mm lymph node is noted adjacent to distal descending thoracic aorta which is also enlarged compared to prior exam. This is concerning for metastatic disease. Increased irregular right infrahilar opacity is noted which extends into the right lower lobe which may represent inflammation or possibly malignancy, although evaluation is limited due the lack of intravenous contrast. Slightly increased left posterior basilar subsegmental atelectasis is noted. Increased right apical densities are noted concerning for worsening inflammation or possibly scarring. New mild opacity is noted posteriorly in the right lower lobe concerning for atelectasis or possibly infiltrate Enlargement of the pancreatic head is noted with possible surrounding inflammation suggesting possible pancreatitis. CT scan of the abdomen pelvis with contrast may be performed for further evaluation. New sclerotic densities are noted in the T8 and T10 vertebral bodies concerning for metastatic disease. Electronically Signed   By: Marijo Conception M.D.   On: 06/01/2021 14:20        Scheduled Meds:  sodium chloride   Intravenous Once   acyclovir  400 mg Oral BID   anastrozole  1 mg Oral Daily   vitamin C  1,000 mg Oral Daily  Chlorhexidine Gluconate Cloth   6 each Topical Daily   ferrous sulfate  325 mg Oral Daily   guaiFENesin  600 mg Oral BID   multivitamin with minerals  1 tablet Oral Daily   venlafaxine XR  37.5 mg Oral Q breakfast   zinc sulfate  220 mg Oral Daily   Continuous Infusions:  ceFEPime (MAXIPIME) IV 2 g (06/01/21 1216)     LOS: 2 days    Time spent:25 mins. More than 50% of that time was spent in counseling and/or coordination of care.      Shelly Coss, MD [facility] P7/12/2020, 5:08 PM

## 2021-06-01 NOTE — Telephone Encounter (Signed)
Scheduled appts per 6/30 sch msg. Called pt, no answer. Left msg with appts dates and times. I had to schedule the infusion on a different date due to limited availability in the infusion room.

## 2021-06-01 NOTE — Progress Notes (Signed)
Pt reports hemoptysis that began yesterday evening 6/30. States she was coughing most of the night. Bright red bloody tissues noted in pt's trash can. MD notified. See new orders.   Trinna Kunst, Bing Neighbors, RN

## 2021-06-01 NOTE — Discharge Summary (Signed)
Physician Discharge Summary  [name] [name] UMP:[id number] DOB: Feb 04, 1971 DOA: 05/30/2021  PCP: Marda Stalker, PA-C  Admit date: 05/30/2021.  Discharge date: 06/05/21  Admitted From: Home.  Disposition:  Home.  Discharge Condition:Stable CODE STATUS:FULL Diet recommendation: Heart Healthy   Brief Summary / Hospital course: This 50 y.o. female with PMH significant of right-sided triple positive metastatic breast cancer, TIA, morbid obesity, hypercalcemia, thrombocytopenia, CKD stage IV, hypertension who was referred to the emergency department from cancer center when she was found to have fever, low blood pressure.  Patient is on chemotherapy for breast cancer. When she presented at cancer center, she was febrile with fever of 101 F, blood pressure was low with systolic of 40G.  Patient was also noticed to be mildly tachypneic. Lab work showed AKI.  Cultures were  sent.   Patient was admitted for suspected sepsis without any clear source. She was started on broad-spectrum antibiotics( vancomycin and cefepime). Hospital Course also remarkable for elevated lipase level.  MRSA nares negative,  vancomycin was discontinued.  Patient was continued on cefepime due to immunocompromised state.  Patient has completed 7 days of IV cefepime.  Procalcitonin was slightly elevated which trended down.  Patient remained afebrile, without leukocytosis.  Patient has been feeling better,  lipase level trended down. Patient denies any abdominal pain,  been tolerating soft bland diet.  Patient feels better and want to be discharged.  Patient is being discharged home.    Following problems were addressed during her hospitalization:  Sepsis without known source: She presented with generalized weakness, tachypnea, hypotension, fever. Lactic acid normal.   Patient is immunocompromised, started on broad-spectrum antibiotics with vancomycin and cefepime.   Cultures, NGTD.  Complains of productive cough but chest x-ray  did not show any pneumonia.   MRSA PCR negative, so discontinued vancomycin. Patient had mild leukocytosis, now resolved.   She had elevated procalcitonin. Source of infection could not be identified. Continue cefepime for now. Continue cefepime for 7 days ( Last day 06/05/21).  Sepsis physiology has resolved.   AKI on CKD stage IV: > Improved. She does not follow with nephrology.  Baseline creatinine in the range of 1.6-1.9.   Patient looked dehydrated on admission, started on IV fluid rehydration .   Renal ultrasound showed decreased echogenicity consistent with medical renal disease, no hydronephrosis.   Kidney function currently at baseline.   Chronic normocytic anemia: Associated with CKD, malignancy, chemotherapy and iron deficiency.   Her baseline hemoglobin is in the range of 8.  She takes iron supplements.   Hemoglobin dropped to the range of 6 , transfused with 2 units of PRBC.   Currently hemoglobin stable in the range of 8.   Non-anion gap metabolic acidosis: >Improving. Most likely secondary to renal insufficiency.  Treated with isotonic fluid with bicarb.   Continue oral bicarb supplementation on discharge.   Thrombocytopenia: Chronic.Likely associated with malignancy/chemotherapy.   Metastatic right-sided breast cancer: Follows with Dr. Jana Hakim, currently on chemotherapy and anastrozole. Dr. Letta Pate  recommended outpatient follow up.   Pancreatitis: CT also showed enlargement of pancreatic head with possible surrounding inflammation ,lipase level is in the range of 200s. She denies abd pain but has Nausea and threw up. Continue IV fluids, Lipase trending down. recheck Lipase level.   Hypercalcemia:  Improved. Most likely secondary to malignancy.   Elevated liver enzymes: Chronic. Most likely associated  with malignancy / chemotherapy.  Also has elevated ALP.  All improved.   Anxiety: Continue venlafaxine.   Hypertension:  She  takes losartan, carvedilol,  spironolactone-hydrochlorothiazide at home, these are on hold due to  hypotension, AKI on presentation.  Her blood pressure has remained stable without any medications.  Continue to monitor without medications at home.   Hemoptysis: Not active bleeding.  Patient reports some blood seen in her sputum.  CT chest without contrast did not show any obvious pathology for hemoptysis.   Discharge Diagnoses:  Active Problems:   Malignant neoplasm of upper-outer quadrant of right breast in female, estrogen receptor positive ([place])   Essential hypertension   Morbid obesity with BMI of 40.0-44.9, adult (HCC)   Anemia associated with stage 4 chronic renal failure (HCC)   Sepsis (HCC)   CKD (chronic kidney disease), stage IV [facility])    Discharge Instructions  Discharge Instructions     Call MD for:  difficulty breathing, headache or visual disturbances   Complete by: As directed    Call MD for:  persistant dizziness or light-headedness   Complete by: As directed    Call MD for:  persistant nausea and vomiting   Complete by: As directed    Diet - low sodium heart healthy   Complete by: As directed    Diet - low sodium heart healthy   Complete by: As directed    Diet Carb Modified   Complete by: As directed    Discharge instructions   Complete by: As directed    1)Please follow up with your oncologist in 1 to 2 weeks 2)Take prescribed medications as instructed 3)Do a BMP just to check your kidney function in a week.Also do a CBC test 4)Your blood pressure was noted to be low during this hospitalization.  We have discontinued all the antihypertensives.  Monitor blood pressure at home.  Follow-up with your PCP in 1 to 2 weeks.   Discharge instructions   Complete by: As directed    Advised to follow-up with primary care physician in 1 week. Patient's blood pressure medication has been discontinued advised PCP to resume blood pressure remains elevated. Patient has completed 7-day treatment for  possible pneumonia. Advised to follow-up with oncologist as scheduled.   Increase activity slowly   Complete by: As directed    Increase activity slowly   Complete by: As directed       Allergies as of 06/05/2021       Reactions   Ace Inhibitors Swelling   Swelling of lips and face   Lisinopril Swelling   Swelling of lips, face   Amlodipine Swelling   Leg swelling   Shellfish Allergy Swelling   Adhesive [tape] Itching, Rash   Please use paper tape   Latex Itching, Rash        Medication List     STOP taking these medications    carvedilol 3.125 MG tablet Commonly known as: COREG   lidocaine 2 % solution Commonly known as: XYLOCAINE   losartan 50 MG tablet Commonly known as: COZAAR   magic mouthwash Soln   spironolactone-hydrochlorothiazide 25-25 MG tablet Commonly known as: ALDACTAZIDE       TAKE these medications    anastrozole 1 MG tablet Commonly known as: ARIMIDEX Take 1 tablet (1 mg total) by mouth daily. Notes to patient: Last Dose of this Medication was given on 06/05/2021 at 9:49 am    cholecalciferol 25 MCG (1000 UNIT) tablet Commonly known as: VITAMIN D3 Take 1,000 Units by mouth daily.   ELDERBERRY PO Take 1 tablet by mouth daily.   ferrous sulfate 325 (65 FE) MG  tablet Take 1 tablet (325 mg total) by mouth daily. Notes to patient: Last Dose of this Medication was given on June 05, 2021 at 9:48 am    guaiFENesin 600 MG 12 hr tablet Commonly known as: MUCINEX Take 1 tablet (600 mg total) by mouth 2 (two) times daily for 5 days. Notes to patient: This Medication is to be taken 2 times per Day. Last Dose of this Medication was given on June 05, 2021 at 9:48 am Please take another dose of this Medication tonight   multivitamin with minerals Tabs tablet Take 1 tablet by mouth daily. Notes to patient: Last Dose of this Medication was given on June 05, 2021 at 9:48 am   PSEUDEPHEDRINE PLUS PO Take 1 tablet by mouth daily as needed  (congestion).   sodium bicarbonate 650 MG tablet Take 1 tablet (650 mg total) by mouth 2 (two) times daily. Notes to patient: This Medication is to be taken 2 times per day.  Last Dose of this Medication was given on June 05, 2021 at 9:49 am Please take another dose of this Medication tonight   venlafaxine XR 37.5 MG 24 hr capsule Commonly known as: EFFEXOR-XR TAKE 1 CAPSULE BY MOUTH DAILY WITH BREAKFAST. Notes to patient: Last Dose of this Medication was given on June 05, 2021 at 8:03 am    Vitamin B12 500 MCG Tabs Take 500 mcg by mouth daily.   vitamin C 1000 MG tablet Take 1,000 mg by mouth daily. Notes to patient: Last Dose of this Medication was given on June 05, 2021 at 9:49 am       ASK your doctor about these medications    acyclovir 400 MG tablet Commonly known as: ZOVIRAX TAKE 1 TABLET BY MOUTH TWICE A DAY Notes to patient: This Medication is to be taken 2 times per day. Last Dose of this Medication was given on July 5. 2022 at 9:49 am   lidocaine-prilocaine cream Commonly known as: EMLA Apply 1 application topically as needed.   prochlorperazine 10 MG tablet Commonly known as: COMPAZINE Take 1 tablet (10 mg total) by mouth every 6 (six) hours as needed (Nausea or vomiting).   traMADol 50 MG tablet Commonly known as: ULTRAM Take 1-2 tablets (50-100 mg total) by mouth every 6 (six) hours as needed.        Follow-up Information     Marda Stalker, PA-C. Schedule an appointment as soon as possible for a visit in 1 week(s).   Specialty: Family Medicine Contact information: [place] [address] [phone]         Magrinat, Virgie Dad, MD Follow up in 2 day(s).   Specialty: Oncology Contact information: [place] [address] [phone]                Allergies  Allergen Reactions   Ace Inhibitors Swelling    Swelling of lips and face   Lisinopril Swelling    Swelling of lips, face    Amlodipine Swelling    Leg swelling   Shellfish Allergy Swelling   Adhesive [Tape] Itching and Rash    Please use paper tape   Latex Itching and Rash    Consultations: None.   Procedures/Studies: CT CHEST WO CONTRAST  Result Date: 06/01/2021 CLINICAL DATA:  Hemoptysis.  History of metastatic breast cancer. EXAM: CT CHEST WITHOUT CONTRAST TECHNIQUE: Multidetector CT imaging of the chest was performed following the standard protocol without IV contrast. COMPARISON:  September 01, 2019.  FINDINGS: Cardiovascular: Mild cardiomegaly is noted. No evidence of thoracic aortic aneurysm. No pericardial effusion. Mediastinum/Nodes: Thyroid gland and esophagus are unremarkable. Mildly increased adenopathy is noted in the aortopulmonary window with the largest lymph node measuring 1 cm. This is concerning for possible metastatic disease. Surgical clips are noted in both axillary regions. 11 mm lymph node is noted adjacent to distal descending thoracic aorta. Lungs/Pleura: No pneumothorax or pleural effusion is noted. Mild left posterior basilar subsegmental atelectasis is noted. Increased right apical densities are noted concerning for worsening inflammation or possibly scarring. Increased right infrahilar opacity is noted extending into the lower lobe which may represent inflammation or possibly malignancy, although evaluation is limited due to the lack of intravenous contrast. Mild opacity is noted posteriorly in the right lower lobe concerning for atelectasis or possibly infiltrate. Upper Abdomen: Enlargement of the pancreatic head is noted with possible surrounding inflammation. Musculoskeletal: Stable sclerotic density seen in T6 vertebral body concerning for metastatic disease. New sclerotic focus is noted posteriorly in T8 vertebral body concerning for metastatic disease. Larger sclerotic density is noted posteriorly in T10 vertebral body concerning for metastatic disease. IMPRESSION: Mildly increased  adenopathy is noted in aortopulmonary window of mediastinum with largest lymph node measuring 1 cm. 11 mm lymph node is noted adjacent to distal descending thoracic aorta which is also enlarged compared to prior exam. This is concerning for metastatic disease. Increased irregular right infrahilar opacity is noted which extends into the right lower lobe which may represent inflammation or possibly malignancy, although evaluation is limited due the lack of intravenous contrast. Slightly increased left posterior basilar subsegmental atelectasis is noted. Increased right apical densities are noted concerning for worsening inflammation or possibly scarring. New mild opacity is noted posteriorly in the right lower lobe concerning for atelectasis or possibly infiltrate Enlargement of the pancreatic head is noted with possible surrounding inflammation suggesting possible pancreatitis. CT scan of the abdomen pelvis with contrast may be performed for further evaluation. New sclerotic densities are noted in the T8 and T10 vertebral bodies concerning for metastatic disease. Electronically Signed   By: Marijo Conception M.D.   On: 06/01/2021 14:20   US RENAL  Result Date: 05/30/2021 CLINICAL DATA:  Acute kidney injury EXAM: RENAL / URINARY TRACT ULTRASOUND COMPLETE COMPARISON:  None. FINDINGS: Right Kidney: Renal measurements: 10.7 x 4.3 x 4.6 cm = volume: 111 mL. Increased cortical echogenicity. No hydronephrosis or focal abnormality. Left Kidney: Renal measurements: 10.9 x 4.3 x 4.2 cm = volume: 104 mL. Increased cortical echogenicity. No hydronephrosis or focal abnormality. Bladder: Ureteral jets bilaterally.  Normal appearance. Other: No free fluid. IMPRESSION: Increased renal echogenicity compatible with medical renal disease. No acute finding or hydronephrosis. Electronically Signed   By: Jerilynn Mages.  Shick M.D.   On: 05/30/2021 16:51   DG CHEST PORT 1 VIEW  Result Date: 06/04/2021 CLINICAL DATA:  Pneumonia.  Metastatic breast  cancer. EXAM: PORTABLE CHEST 1 VIEW COMPARISON:  05/30/2021 chest x-ray and 06/01/2021 chest CT FINDINGS: Patient has LEFT-sided power port, tip overlying the superior vena cava. The heart size is normal. Persistent minimal opacity in the MEDIAL RIGHT UPPER lobe and RIGHT infrahilar region. LEFT lung is clear. Prior RIGHT mastectomy and axillary node dissection. IMPRESSION: Persistent hazy density in the MEDIAL RIGHT UPPER lobe and asymmetry in the RIGHT UPPER hilar region. No new findings. Electronically Signed   By: Nolon Nations M.D.   On: 06/04/2021 11:58   DG Chest Port 1 View  Result Date: 05/30/2021 CLINICAL DATA:  Fever and shortness of breath EXAM: PORTABLE CHEST 1 VIEW COMPARISON:  02/26/2021 FINDINGS: Cardiac shadow is within normal limits. Mild fullness in the right hilum is noted consistent with the nodule seen on prior PET-CT examination. Lungs are otherwise clear. Minimal scarring is noted in the right base. Postsurgical changes in the axilla are seen bilaterally. Left chest wall port is noted in satisfactory position. IMPRESSION: Fullness in the right hilum consistent with the known hilar nodule. No other focal abnormality is seen. Electronically Signed   By: Inez Catalina M.D.   On: 05/30/2021 11:55      Subjective: Patient seen and examined at bedside this morning.  Hemodynamically stable.  Comfortable.   Daughter at the bedside.  She feels better and want to be discharged.   Discharge Exam: Vitals:   06/04/21 2143 06/05/21 0532  BP: (!) 145/85 131/77  Pulse: 88 84  Resp: 18 18  Temp: 99 F (37.2 C) 98.6 F (37 C)  SpO2: 100% 97%   Vitals:   06/04/21 0300 06/04/21 1337 06/04/21 2143 06/05/21 0532  BP: 132/79 139/82 (!) 145/85 131/77  Pulse: 87 86 88 84  Resp: 16 18 18 18   Temp: 99.3 F (37.4 C) 98.1 F (36.7 C) 99 F (37.2 C) 98.6 F (37 C)  TempSrc: Oral Oral Oral Oral  SpO2: 97% 99% 100% 97%  Weight:      Height:        General: Pt is alert, awake, not in  acute distress Cardiovascular: RRR, S1/S2 +, no rubs, no gallops Respiratory: CTA bilaterally, no wheezing, no rhonchi Abdominal: Soft, NT, ND, bowel sounds + Extremities: Nonpitting edema in the bilateral lower extremities, left arm lymphedema    The results of significant diagnostics from this hospitalization (including imaging, microbiology, ancillary and laboratory) are listed below for reference.     Microbiology: Recent Results (from the past 240 hour(s))  Urine culture     Status: Abnormal   Collection Time: 05/30/21 11:53 AM   Specimen: In/Out Cath Urine  Result Value Ref Range Status   Specimen Description   Final    IN/OUT CATH URINE Performed at [facility] [address] [place], [place] [address]    Special Requests   Final    NONE Performed at [facility], [place] [address], [place], [place] [address]    Culture MULTIPLE SPECIES PRESENT, SUGGEST RECOLLECTION (A)  Final   Report Status 06/01/2021 FINAL  Final  Blood Culture (routine x 2)     Status: None   Collection Time: 05/30/21 12:00 PM   Specimen: Porta Cath; Blood  Result Value Ref Range Status   Specimen Description   Final    PORTA CATH Performed at [facility] [address] [place], [place] [address]    Special Requests   Final    BOTTLES DRAWN AEROBIC AND ANAEROBIC Blood Culture adequate volume Performed at [facility] [address], [place], [place] [address]    Culture   Final    NO GROWTH 5 DAYS Performed at [facility], [place] [address] [place],  [address]    Report Status 06/04/2021 FINAL  Final  Blood Culture (routine x 2)     Status: None   Collection Time: 05/30/21 12:10 PM   Specimen: BLOOD  Result Value Ref Range Status   Specimen Description   Final    BLOOD LEFT ANTECUBITAL Performed at [facility], [place] [address],  [place], [place] [address]    Special Requests   Final     BOTTLES DRAWN AEROBIC AND ANAEROBIC Blood Culture adequate volume Performed at [facility] [address], [place], [place] [address]    Culture   Final    NO GROWTH 5 DAYS Performed at [facility], [place] [address] [place], [place] [address]    Report Status 06/04/2021 FINAL  Final  Resp Panel by RT-PCR (Flu A&B, Covid) Nasopharyngeal Swab     Status: None   Collection Time: 05/30/21 12:15 PM   Specimen: Nasopharyngeal Swab; Nasopharyngeal(NP) swabs in vial transport medium  Result Value Ref Range Status   SARS Coronavirus 2 by RT PCR NEGATIVE NEGATIVE Final    Comment: (NOTE) SARS-CoV-2 target nucleic acids are NOT DETECTED.  The SARS-CoV-2 RNA is generally detectable in upper respiratory specimens during the acute phase of infection. The lowest concentration of SARS-CoV-2 viral copies this assay can detect is 138 copies/mL. A negative result does not preclude SARS-Cov-2 infection and should not be used as the sole basis for treatment or other patient management decisions. A negative result may occur with  improper specimen collection/handling, submission of specimen other than nasopharyngeal swab, presence of viral mutation(s) within the areas targeted by this assay, and inadequate number of viral copies(<138 copies/mL). A negative result must be combined with clinical observations, patient history, and epidemiological information. The expected result is Negative.  Fact Sheet for Patients:  EntrepreneurPulse.com.au  Fact Sheet for Healthcare Providers:  IncredibleEmployment.be  This test is no t yet approved or cleared by the [place] FDA and  has been authorized for detection and/or diagnosis of SARS-CoV-2 by FDA under an Emergency Use Authorization (EUA). This EUA will remain  in effect (meaning this test can be used) for the duration of the COVID-19 declaration under Section 564(b)(1) of the Act,  21 U.S.C.section 360bbb-3(b)(1), unless the authorization is terminated  or revoked sooner.       Influenza A by PCR NEGATIVE NEGATIVE Final   Influenza B by PCR NEGATIVE NEGATIVE Final    Comment: (NOTE) The Xpert Xpress SARS-CoV-2/FLU/RSV plus assay is intended as an aid in the diagnosis of influenza from Nasopharyngeal swab specimens and should not be used as a sole basis for treatment. Nasal washings and aspirates are unacceptable for Xpert Xpress SARS-CoV-2/FLU/RSV testing.  Fact Sheet for Patients: EntrepreneurPulse.com.au  Fact Sheet for Healthcare Providers: IncredibleEmployment.be  This test is not yet approved or cleared by the [place] FDA and has been authorized for detection and/or diagnosis of SARS-CoV-2 by FDA under an Emergency Use Authorization (EUA). This EUA will remain in effect (meaning this test can be used) for the duration of the COVID-19 declaration under Section 564(b)(1) of the Act, 21 U.S.C. section 360bbb-3(b)(1), unless the authorization is terminated or revoked.  Performed at [facility], [place] [address], [place], [place] [address]   MRSA PCR Screening     Status: None   Collection Time: 05/31/21  9:21 AM  Result Value Ref Range Status   MRSA by PCR NEGATIVE NEGATIVE Final    Comment:        The GeneXpert MRSA Assay (FDA approved for NASAL specimens only), is one component of a comprehensive MRSA colonization surveillance program. It is not intended to diagnose MRSA infection nor to guide or monitor treatment for MRSA infections. Performed at [facility], [place] [address] [place],  [address]      Labs: BNP (last 3  results) No results for input(s): BNP in the last 8760 hours. Basic Metabolic Panel: Recent Labs  Lab 06/01/21 0421 06/02/21 0333 06/03/21 0341 06/04/21 0322 06/05/21 0120  NA 134* 134* 134* 135 136  K 4.4 4.1 4.4 4.1 3.9  CL 105  105 107 109 110  CO2 22 21* 20* 19* 18*  GLUCOSE 88 86 85 103* 92  BUN 54* 46* 40* 44* 41*  CREATININE 2.01* 1.79* 1.65* 1.82* 1.62*  CALCIUM 9.8 9.9 10.1 9.6 9.6  MG  --   --  1.7  --  1.8  PHOS  --   --  2.6  --  2.1*   Liver Function Tests: Recent Labs  Lab 05/30/21 0917 05/31/21 0615 06/01/21 0421  AST 96* 65* 64*  ALT 56* 40 37  ALKPHOS 163* 116 115  BILITOT 1.8* 1.4* 1.9*  PROT 7.7 6.3* 6.3*  ALBUMIN 2.8* 2.6* 2.5*   Recent Labs  Lab 06/01/21 1523 06/02/21 0333 06/03/21 0341 06/04/21 0322 06/05/21 0120  LIPASE 203* 213* 183* 204* 207*   No results for input(s): AMMONIA in the last 168 hours. CBC: Recent Labs  Lab 05/30/21 0917 05/31/21 0615 06/01/21 0421 06/02/21 0333 06/03/21 0341 06/04/21 0322 06/05/21 0120  WBC 12.8*   < > 7.5 6.2 5.9 5.5 5.9  NEUTROABS 10.7*  --  4.5 3.7  --   --   --   HGB 7.3*   < > 8.1* 8.4* 8.3* 7.9* 8.2*  HCT 22.0*   < > 24.7* 26.2* 26.5* 25.3* 26.2*  MCV 85.9   < > 88.8 90.7 92.7 92.7 93.9  PLT 107*   < > 78* 81* 85* 77* 82*   < > = values in this interval not displayed.   Cardiac Enzymes: No results for input(s): CKTOTAL, CKMB, CKMBINDEX, TROPONINI in the last 168 hours. BNP: Invalid input(s): POCBNP CBG: No results for input(s): GLUCAP in the last 168 hours. D-Dimer No results for input(s): DDIMER in the last 72 hours. Hgb A1c No results for input(s): HGBA1C in the last 72 hours. Lipid Profile No results for input(s): CHOL, HDL, LDLCALC, TRIG, CHOLHDL, LDLDIRECT in the last 72 hours. Thyroid function studies No results for input(s): TSH, T4TOTAL, T3FREE, THYROIDAB in the last 72 hours.  Invalid input(s): FREET3 Anemia work up No results for input(s): VITAMINB12, FOLATE, FERRITIN, TIBC, IRON, RETICCTPCT in the last 72 hours. Urinalysis    Component Value Date/Time   COLORURINE YELLOW 05/30/2021 1153   APPEARANCEUR CLEAR 05/30/2021 1153   LABSPEC 1.010 05/30/2021 1153   PHURINE 5.0 05/30/2021 1153   GLUCOSEU  NEGATIVE 05/30/2021 1153   HGBUR SMALL (A) 05/30/2021 1153   BILIRUBINUR NEGATIVE 05/30/2021 1153   [place] 05/30/2021 1153   PROTEINUR 30 (A) 05/30/2021 1153   UROBILINOGEN 0.2 11/29/2011 1656   NITRITE NEGATIVE 05/30/2021 1153   LEUKOCYTESUR NEGATIVE 05/30/2021 1153   Sepsis Labs Invalid input(s): PROCALCITONIN,  WBC,  LACTICIDVEN Microbiology Recent Results (from the past 240 hour(s))  Urine culture     Status: Abnormal   Collection Time: 05/30/21 11:53 AM   Specimen: In/Out Cath Urine  Result Value Ref Range Status   Specimen Description   Final    IN/OUT CATH URINE Performed at [facility], [place] [address], [place], [place] [address]    Special Requests   Final    NONE Performed at [facility], [place] [address], [place], [place] [address]    Culture MULTIPLE SPECIES PRESENT, SUGGEST RECOLLECTION (A)  Final  Report Status 06/01/2021 FINAL  Final  Blood Culture (routine x 2)     Status: None   Collection Time: 05/30/21 12:00 PM   Specimen: Porta Cath; Blood  Result Value Ref Range Status   Specimen Description   Final    PORTA CATH Performed at [facility] [address] [place], [place] [address]    Special Requests   Final    BOTTLES DRAWN AEROBIC AND ANAEROBIC Blood Culture adequate volume Performed at [facility] [address] [place], [place] [address]    Culture   Final    NO GROWTH 5 DAYS Performed at [facility], [place] [address], [place], [place] [address]    Report Status 06/04/2021 FINAL  Final  Blood Culture (routine x 2)     Status: None   Collection Time: 05/30/21 12:10 PM   Specimen: BLOOD  Result Value Ref Range Status   Specimen Description   Final    BLOOD LEFT ANTECUBITAL Performed at [facility], [place] [address], [place], [place] [address]    Special Requests   Final    BOTTLES DRAWN AEROBIC AND ANAEROBIC Blood Culture adequate  volume Performed at [facility] [address], [place], [place] [address]    Culture   Final    NO GROWTH 5 DAYS Performed at [facility], [place] [address] [place],  [address]    Report Status 06/04/2021 FINAL  Final  Resp Panel by RT-PCR (Flu A&B, Covid) Nasopharyngeal Swab     Status: None   Collection Time: 05/30/21 12:15 PM   Specimen: Nasopharyngeal Swab; Nasopharyngeal(NP) swabs in vial transport medium  Result Value Ref Range Status   SARS Coronavirus 2 by RT PCR NEGATIVE NEGATIVE Final    Comment: (NOTE) SARS-CoV-2 target nucleic acids are NOT DETECTED.  The SARS-CoV-2 RNA is generally detectable in upper respiratory specimens during the acute phase of infection. The lowest concentration of SARS-CoV-2 viral copies this assay can detect is 138 copies/mL. A negative result does not preclude SARS-Cov-2 infection and should not be used as the sole basis for treatment or other patient management decisions. A negative result may occur with  improper specimen collection/handling, submission of specimen other than nasopharyngeal swab, presence of viral mutation(s) within the areas targeted by this assay, and inadequate number of viral copies(<138 copies/mL). A negative result must be combined with clinical observations, patient history, and epidemiological information. The expected result is Negative.  Fact Sheet for Patients:  EntrepreneurPulse.com.au  Fact Sheet for Healthcare Providers:  IncredibleEmployment.be  This test is no t yet approved or cleared by the [place] FDA and  has been authorized for detection and/or diagnosis of SARS-CoV-2 by FDA under an Emergency Use Authorization (EUA). This EUA will remain  in effect (meaning this test can be used) for the duration of the COVID-19 declaration under Section 564(b)(1) of the Act, 21 U.S.C.section 360bbb-3(b)(1), unless the authorization is  terminated  or revoked sooner.       Influenza A by PCR NEGATIVE NEGATIVE Final   Influenza B by PCR NEGATIVE NEGATIVE Final    Comment: (NOTE) The Xpert Xpress SARS-CoV-2/FLU/RSV plus assay is intended as an aid in the diagnosis of influenza from Nasopharyngeal swab specimens and should not be used as a sole basis for treatment. Nasal washings and aspirates are unacceptable for Xpert Xpress SARS-CoV-2/FLU/RSV testing.  Fact Sheet for Patients: EntrepreneurPulse.com.au  Fact Sheet for Healthcare Providers: IncredibleEmployment.be  This  test is not yet approved or cleared by the [place] and has been authorized for detection and/or diagnosis of SARS-CoV-2 by FDA under an Emergency Use Authorization (EUA). This EUA will remain in effect (meaning this test can be used) for the duration of the COVID-19 declaration under Section 564(b)(1) of the Act, 21 U.S.C. section 360bbb-3(b)(1), unless the authorization is terminated or revoked.  Performed at [facility], [place] [address], [place], [place] [address]   MRSA PCR Screening     Status: None   Collection Time: 05/31/21  9:21 AM  Result Value Ref Range Status   MRSA by PCR NEGATIVE NEGATIVE Final    Comment:        The GeneXpert MRSA Assay (FDA approved for NASAL specimens only), is one component of a comprehensive MRSA colonization surveillance program. It is not intended to diagnose MRSA infection nor to guide or monitor treatment for MRSA infections. Performed at [facility], [place] [address] [place], [place] [address]     Please note: You were cared for by a hospitalist during your hospital stay. Once you are discharged, your primary care physician will handle any further medical issues. Please note that NO REFILLS for any discharge medications will be authorized once you are discharged, as it is imperative that you return to your primary  care physician (or establish a relationship with a primary care physician if you do not have one) for your post hospital discharge needs so that they can reassess your need for medications and monitor your lab values.  Time coordinating discharge: 40 minutes  SIGNED:   Shawna Clamp, MD  [facility] 06/05/2021, 12:07 PM Pager [phone]  If 7PM-7AM, please contact night-coverage [email or web address]

## 2021-06-01 NOTE — Progress Notes (Signed)
Mobility Specialist - Progress Note    06/01/21 1526  Mobility  Activity Ambulated in hall  Level of Assistance Independent  Assistive Device None;Other (Comment) (Helped push IV pole in beginning, but later returned IV pole to Mobility Specialist)  Distance Ambulated (ft) 500 ft  Mobility Ambulated independently in hallway  Mobility Response Tolerated well  Mobility performed by Mobility specialist  $Mobility charge 1 Mobility    Pt independently ambulated 525ft in hallway. Pt did not c/o of SOB or dizziness and session was tolerated well. Pt left in chair with family and no chair alarm set per nurse.   [place] Specialist Acute Rehabilitation Services Office: [phone] 06/01/21, 3:29 PM

## 2021-06-02 DIAGNOSIS — N170 Acute kidney failure with tubular necrosis: Secondary | ICD-10-CM

## 2021-06-02 DIAGNOSIS — A419 Sepsis, unspecified organism: Principal | ICD-10-CM

## 2021-06-02 DIAGNOSIS — R6520 Severe sepsis without septic shock: Secondary | ICD-10-CM

## 2021-06-02 LAB — BASIC METABOLIC PANEL
Anion gap: 8 (ref 5–15)
BUN: 46 mg/dL — ABNORMAL HIGH (ref 6–20)
CO2: 21 mmol/L — ABNORMAL LOW (ref 22–32)
Calcium: 9.9 mg/dL (ref 8.9–10.3)
Chloride: 105 mmol/L (ref 98–111)
Creatinine, Ser: 1.79 mg/dL — ABNORMAL HIGH (ref 0.44–1.00)
GFR, Estimated: 34 mL/min — ABNORMAL LOW (ref >60)
Glucose, Bld: 86 mg/dL (ref 70–99)
Potassium: 4.1 mmol/L (ref 3.5–5.1)
Sodium: 134 mmol/L — ABNORMAL LOW (ref 135–145)

## 2021-06-02 LAB — CBC WITH DIFFERENTIAL/PLATELET
Abs Immature Granulocytes: 0.03 10*3/uL (ref 0.00–0.07)
Basophils Absolute: 0 10*3/uL (ref 0.0–0.1)
Basophils Relative: 1 %
Eosinophils Absolute: 0.3 10*3/uL (ref 0.0–0.5)
Eosinophils Relative: 4 %
HCT: 26.2 % — ABNORMAL LOW (ref 36.0–46.0)
Hemoglobin: 8.4 g/dL — ABNORMAL LOW (ref 12.0–15.0)
Immature Granulocytes: 1 %
Lymphocytes Relative: 18 %
Lymphs Abs: 1.1 10*3/uL (ref 0.7–4.0)
MCH: 29.1 pg (ref 26.0–34.0)
MCHC: 32.1 g/dL (ref 30.0–36.0)
MCV: 90.7 fL (ref 80.0–100.0)
Monocytes Absolute: 1 10*3/uL (ref 0.1–1.0)
Monocytes Relative: 16 %
Neutro Abs: 3.7 10*3/uL (ref 1.7–7.7)
Neutrophils Relative %: 60 %
Platelets: 81 10*3/uL — ABNORMAL LOW (ref 150–400)
RBC: 2.89 MIL/uL — ABNORMAL LOW (ref 3.87–5.11)
RDW: 19.4 % — ABNORMAL HIGH (ref 11.5–15.5)
WBC: 6.2 10*3/uL (ref 4.0–10.5)
nRBC: 0 % (ref 0.0–0.2)

## 2021-06-02 LAB — LIPASE, BLOOD: Lipase: 213 U/L — ABNORMAL HIGH (ref 11–51)

## 2021-06-02 MED ORDER — ONDANSETRON HCL 4 MG/2ML IJ SOLN: 4.0000 mg | Freq: Four times a day (QID) | INTRAMUSCULAR | Status: DC | PRN

## 2021-06-02 NOTE — Progress Notes (Signed)
PROGRESS NOTE    [name] [name]  KKX:[id number] DOB: 1971-09-19 DOA: 05/30/2021 PCP: Marda Stalker, PA-C   Brief Narrative:  This 50 y.o. female with PMH significant of right-sided triple positive metastatic breast cancer, TIA, morbid obesity, hypercalcemia, thrombocytopenia, CKD stage IV, hypertension who was referred to the emergency department from cancer center when she was found to have fever, low blood pressure.  Patient is on chemotherapy for breast cancer. When she presented at cancer center, she was febrile with fever of 101 F, blood pressure was low with systolic of 16R.  Patient was also noticed to be mildly tachypneic.  Lab work showed AKI.  Culture sent.  Patient admitted for the suspicion of sepsis without any clear source.  Started on broad-spectrum antibiotics. Hospital Course also remarkable for elevated lipase level.  Assessment & Plan:   Active Problems:   Malignant neoplasm of upper-outer quadrant of right breast in female, estrogen receptor positive ([place])   Essential hypertension   Morbid obesity with BMI of 40.0-44.9, adult (HCC)   Anemia associated with stage 4 chronic renal failure (HCC)   Sepsis (HCC)   CKD (chronic kidney disease), stage IV ([place])   Sepsis without known source. She presented with generalized weakness, tachypnea, hypotension, fever.  Lactic acid level normal.   Patient is immunocompromised, started on broad-spectrum antibiotics with vancomycin and cefepime.   Cultures, NGTD.   Complains of productive cough but chest x-ray did not show any pneumonia.   MRSA PCR negative, so discontinued vancomycin. Patient had mild leukocytosis,n ow resolved.   She had elevated procalcitonin.  Source of infection could not be identified.  Continue cefepime for now. Antibiotics will be changed to oral on discharge.     AKI on CKD stage IV: > Improved. She does not follow with nephrology.  Baseline creatinine in the range of 1.6-1.9.   Patient looked dehydrated  on admission, started on IV fluid rehydration .   Renal ultrasound showed decreased echogenicity consistent with medical renal disease, no hydronephrosis.   Kidney function currently at baseline.   Chronic normocytic anemia:  Associated with CKD, malignancy, chemotherapy and iron deficiency.   Her baseline  hemoglobin is in the range of 8.  She takes iron supplements.   Hemoglobin dropped to the range of 6 , transfused with 2 units of PRBC.  Currently hemoglobin stable in the range of 8.   Non-anion gap metabolic acidosis: > Resolved. Most likely secondary to renal insufficiency.  Treated with isotonic fluid with bicarb.  Continue oral bicarb supplementation on discharge.   Thrombocytopenia: Chronic.  Likely associated with malignancy/chemotherapy.   Metastatic right-sided breast cancer:  Follows with Dr. Jana Hakim, currently on chemotherapy and anastrozole.  Dr. Letta Pate  recommended outpatient follow up.   Pancreatitis: CT also showed enlargement of pancreatic head with possible surrounding inflammation ,lipase level is in the range of 200s. She denies abd pain but has Nausea and threw up. Continue IV fluids, recheck Lipase level.   Hypercalcemia:  Improved. Most likely secondary to malignancy.   Elevated liver enzymes: Chronic. Most likely associated  with malignancy / chemotherapy.  Also has elevated ALP.  All improved.   Anxiety: Continue venlafaxine.   Hypertension:  She takes losartan, carvedilol, spironolactone-hydrochlorothiazide at home, these are on hold due to severe hypotension, AKI on presentation.  Her blood pressure has remained stable without any medications.  Continue to monitor without medications at home.   Hemoptysis: Not active bleeding.  Patient reports some blood seen in her sputum.  CT chest  without contrast did not show any obvious pathology for hemoptysis.    DVT prophylaxis:  Heparin sq Code Status: Full code. Family Communication: Daughter @ bed  side. Disposition Plan:   Status is: Inpatient  Remains inpatient appropriate because:Inpatient level of care appropriate due to severity of illness  Dispo: The patient is from: Home              Anticipated d/c is to: Home              Patient currently is not medically stable to d/c.   Difficult to place patient No  Consultants:  None  Procedures: Antimicrobials:  Anti-infectives (From admission, onward)    Start     Dose/Rate Route Frequency Ordered Stop   06/01/21 1400  vancomycin (VANCOREADY) IVPB 750 mg/150 mL  Status:  Discontinued        750 mg 150 mL/hr over 60 Minutes Intravenous Every 48 hours 05/30/21 1627 05/31/21 1141   06/01/21 1200  ceFEPIme (MAXIPIME) 2 g in sodium chloride 0.9 % 100 mL IVPB        2 g 200 mL/hr over 30 Minutes Intravenous Every 12 hours 06/01/21 0914     05/31/21 1200  ceFEPIme (MAXIPIME) 2 g in sodium chloride 0.9 % 100 mL IVPB  Status:  Discontinued        2 g 200 mL/hr over 30 Minutes Intravenous Every 24 hours 05/30/21 1626 06/01/21 0914   05/30/21 2200  acyclovir (ZOVIRAX) tablet 400 mg        400 mg Oral 2 times daily 05/30/21 1549     05/30/21 1145  vancomycin (VANCOREADY) IVPB 1250 mg/250 mL        1,250 mg 166.7 mL/hr over 90 Minutes Intravenous  Once 05/30/21 1138 05/30/21 1840   05/30/21 1130  ceFEPIme (MAXIPIME) 2 g in sodium chloride 0.9 % 100 mL IVPB        2 g 200 mL/hr over 30 Minutes Intravenous  Once 05/30/21 1129 05/30/21 1258   05/30/21 1130  metroNIDAZOLE (FLAGYL) IVPB 500 mg        500 mg 100 mL/hr over 60 Minutes Intravenous  Once 05/30/21 1129 05/30/21 1432   05/30/21 1130  vancomycin (VANCOCIN) IVPB 1000 mg/200 mL premix  Status:  Discontinued        1,000 mg 200 mL/hr over 60 Minutes Intravenous  Once 05/30/21 1129 05/30/21 1138        Subjective: Patient was seen and examined at bedside.  Overnight events noted.   Patient denies any abdominal pain but reports having nausea and  vomiting.  Objective: Vitals:   06/01/21 0329 06/01/21 1233 06/01/21 2010 06/02/21 0631  BP: 116/69 129/71 123/70 125/61  Pulse: 81 76 83 86  Resp: 15 19 18 18   Temp: 99.2 F (37.3 C) 98.5 F (36.9 C) 98.2 F (36.8 C) 99 F (37.2 C)  TempSrc: Oral Oral Oral Oral  SpO2: 92% 96% 99% 97%  Weight:      Height:        Intake/Output Summary (Last 24 hours) at 06/02/2021 1313 Last data filed at 06/02/2021 0125 Gross per 24 hour  Intake 560 ml  Output --  Net 560 ml   Filed Weights   05/30/21 1039 05/30/21 1949  Weight: 78.9 kg 80.5 kg    Examination:  General exam: Appears calm and comfortable, not in any acute distress. Respiratory system: Clear to auscultation. Respiratory effort normal. Cardiovascular system: S1 & S2 heard, RRR. No JVD, murmurs,  rubs, gallops or clicks. No pedal edema. Gastrointestinal system: Abdomen is nondistended, soft and nontender. No organomegaly or masses felt. Normal bowel sounds heard. Central nervous system: Alert and oriented. No focal neurological deficits. Extremities: Symmetric 5 x 5 power.  No edema, no cyanosis, no clubbing. Skin: No rashes, lesions or ulcers Psychiatry: Judgement and insight appear normal. Mood & affect appropriate.     Data Reviewed: I have personally reviewed following labs and imaging studies  CBC: Recent Labs  Lab 05/30/21 0917 05/31/21 0615 06/01/21 0421 06/02/21 0333  WBC 12.8* 9.2 7.5 6.2  NEUTROABS 10.7*  --  4.5 3.7  HGB 7.3* 6.0* 8.1* 8.4*  HCT 22.0* 18.8* 24.7* 26.2*  MCV 85.9 90.4 88.8 90.7  PLT 107* 87* 78* 81*   Basic Metabolic Panel: Recent Labs  Lab 05/30/21 0917 05/31/21 0615 06/01/21 0421 06/02/21 0333  NA 131* 134* 134* 134*  K 5.3* 4.5 4.4 4.1  CL 108 107 105 105  CO2 13* 18* 22 21*  GLUCOSE 137* 109* 88 86  BUN 65* 63* 54* 46*  CREATININE 2.77* 2.43* 2.01* 1.79*  CALCIUM 11.3* 10.1 9.8 9.9   GFR: Estimated Creatinine Clearance: 34.9 mL/min (A) (by C-G formula based on SCr of  1.79 mg/dL (H)). Liver Function Tests: Recent Labs  Lab 05/30/21 0917 05/31/21 0615 06/01/21 0421  AST 96* 65* 64*  ALT 56* 40 37  ALKPHOS 163* 116 115  BILITOT 1.8* 1.4* 1.9*  PROT 7.7 6.3* 6.3*  ALBUMIN 2.8* 2.6* 2.5*   Recent Labs  Lab 06/01/21 1523 06/02/21 0333  LIPASE 203* 213*   No results for input(s): AMMONIA in the last 168 hours. Coagulation Profile: Recent Labs  Lab 05/30/21 1214  INR 1.1   Cardiac Enzymes: No results for input(s): CKTOTAL, CKMB, CKMBINDEX, TROPONINI in the last 168 hours. BNP (last 3 results) No results for input(s): PROBNP in the last 8760 hours. HbA1C: No results for input(s): HGBA1C in the last 72 hours. CBG: No results for input(s): GLUCAP in the last 168 hours. Lipid Profile: No results for input(s): CHOL, HDL, LDLCALC, TRIG, CHOLHDL, LDLDIRECT in the last 72 hours. Thyroid Function Tests: No results for input(s): TSH, T4TOTAL, FREET4, T3FREE, THYROIDAB in the last 72 hours. Anemia Panel: No results for input(s): VITAMINB12, FOLATE, FERRITIN, TIBC, IRON, RETICCTPCT in the last 72 hours. Sepsis Labs: Recent Labs  Lab 05/30/21 1214 05/30/21 1630  PROCALCITON  --  2.28  LATICACIDVEN 1.4  --     Recent Results (from the past 240 hour(s))  Urine culture     Status: Abnormal   Collection Time: 05/30/21 11:53 AM   Specimen: In/Out Cath Urine  Result Value Ref Range Status   Specimen Description   Final    IN/OUT CATH URINE Performed at [facility] [address], [place], [place] [address]    Special Requests   Final    NONE Performed at [facility], [place] [address], [place], [place] [address]    Culture MULTIPLE SPECIES PRESENT, SUGGEST RECOLLECTION (A)  Final   Report Status 06/01/2021 FINAL  Final  Blood Culture (routine x 2)     Status: None (Preliminary result)   Collection Time: 05/30/21 12:00 PM   Specimen: Porta Cath; Blood  Result Value Ref Range Status   Specimen  Description   Final    PORTA CATH Performed at [facility] [address], [place], [place] [address]    Special Requests   Final  BOTTLES DRAWN AEROBIC AND ANAEROBIC Blood Culture adequate volume Performed at [facility] [address], [place], [place] [address]    Culture   Final    NO GROWTH 3 DAYS Performed at [facility], [place] [address], [place], [place] [address]    Report Status PENDING  Incomplete  Blood Culture (routine x 2)     Status: None (Preliminary result)   Collection Time: 05/30/21 12:10 PM   Specimen: BLOOD  Result Value Ref Range Status   Specimen Description   Final    BLOOD LEFT ANTECUBITAL Performed at [facility], [place] [address] [place], [place] [address]    Special Requests   Final    BOTTLES DRAWN AEROBIC AND ANAEROBIC Blood Culture adequate volume Performed at [facility] [address] [place], [place] [address]    Culture   Final    NO GROWTH 3 DAYS Performed at [facility], [place] [address], [place],  [address]    Report Status PENDING  Incomplete  Resp Panel by RT-PCR (Flu A&B, Covid) Nasopharyngeal Swab     Status: None   Collection Time: 05/30/21 12:15 PM   Specimen: Nasopharyngeal Swab; Nasopharyngeal(NP) swabs in vial transport medium  Result Value Ref Range Status   SARS Coronavirus 2 by RT PCR NEGATIVE NEGATIVE Final    Comment: (NOTE) SARS-CoV-2 target nucleic acids are NOT DETECTED.  The SARS-CoV-2 RNA is generally detectable in upper respiratory specimens during the acute phase of infection. The lowest concentration of SARS-CoV-2 viral copies this assay can detect is 138 copies/mL. A negative result does not preclude SARS-Cov-2 infection and should not be used as the sole basis for treatment or other patient management decisions. A negative result may occur with  improper specimen collection/handling, submission of specimen other than  nasopharyngeal swab, presence of viral mutation(s) within the areas targeted by this assay, and inadequate number of viral copies(<138 copies/mL). A negative result must be combined with clinical observations, patient history, and epidemiological information. The expected result is Negative.  Fact Sheet for Patients:  EntrepreneurPulse.com.au  Fact Sheet for Healthcare Providers:  IncredibleEmployment.be  This test is no t yet approved or cleared by the [place] FDA and  has been authorized for detection and/or diagnosis of SARS-CoV-2 by FDA under an Emergency Use Authorization (EUA). This EUA will remain  in effect (meaning this test can be used) for the duration of the COVID-19 declaration under Section 564(b)(1) of the Act, 21 U.S.C.section 360bbb-3(b)(1), unless the authorization is terminated  or revoked sooner.       Influenza A by PCR NEGATIVE NEGATIVE Final   Influenza B by PCR NEGATIVE NEGATIVE Final    Comment: (NOTE) The Xpert Xpress SARS-CoV-2/FLU/RSV plus assay is intended as an aid in the diagnosis of influenza from Nasopharyngeal swab specimens and should not be used as a sole basis for treatment. Nasal washings and aspirates are unacceptable for Xpert Xpress SARS-CoV-2/FLU/RSV testing.  Fact Sheet for Patients: EntrepreneurPulse.com.au  Fact Sheet for Healthcare Providers: IncredibleEmployment.be  This test is not yet approved or cleared by the [place] FDA and has been authorized for detection and/or diagnosis of SARS-CoV-2 by FDA under an Emergency Use Authorization (EUA). This EUA will remain in effect (meaning this test can be used) for the duration of the COVID-19 declaration under Section 564(b)(1) of the Act, 21 U.S.C. section 360bbb-3(b)(1), unless the authorization is terminated or revoked.  Performed at [facility], [address] [place], [place] [address]   MRSA PCR Screening     Status: None   Collection Time: 05/31/21  9:21 AM  Result Value Ref Range Status   MRSA by PCR NEGATIVE NEGATIVE Final    Comment:        The GeneXpert MRSA Assay (FDA approved for NASAL specimens only), is one component of a comprehensive MRSA colonization surveillance program. It is not intended to diagnose MRSA infection nor to guide or monitor treatment for MRSA infections. Performed at [facility], [place] [address], [place], [place] [address]     Radiology Studies: CT CHEST WO CONTRAST  Result Date: 06/01/2021 CLINICAL DATA:  Hemoptysis.  History of metastatic breast cancer. EXAM: CT CHEST WITHOUT CONTRAST TECHNIQUE: Multidetector CT imaging of the chest was performed following the standard protocol without IV contrast. COMPARISON:  September 01, 2019. FINDINGS: Cardiovascular: Mild cardiomegaly is noted. No evidence of thoracic aortic aneurysm. No pericardial effusion. Mediastinum/Nodes: Thyroid gland and esophagus are unremarkable. Mildly increased adenopathy is noted in the aortopulmonary window with the largest lymph node measuring 1 cm. This is concerning for possible metastatic disease. Surgical clips are noted in both axillary regions. 11 mm lymph node is noted adjacent to distal descending thoracic aorta. Lungs/Pleura: No pneumothorax or pleural effusion is noted. Mild left posterior basilar subsegmental atelectasis is noted. Increased right apical densities are noted concerning for worsening inflammation or possibly scarring. Increased right infrahilar opacity is noted extending into the lower lobe which may represent inflammation or possibly malignancy, although evaluation is limited due to the lack of intravenous contrast. Mild opacity is noted posteriorly in the right lower lobe concerning for atelectasis or possibly infiltrate. Upper Abdomen: Enlargement of the pancreatic head is noted with possible  surrounding inflammation. Musculoskeletal: Stable sclerotic density seen in T6 vertebral body concerning for metastatic disease. New sclerotic focus is noted posteriorly in T8 vertebral body concerning for metastatic disease. Larger sclerotic density is noted posteriorly in T10 vertebral body concerning for metastatic disease. IMPRESSION: Mildly increased adenopathy is noted in aortopulmonary window of mediastinum with largest lymph node measuring 1 cm. 11 mm lymph node is noted adjacent to distal descending thoracic aorta which is also enlarged compared to prior exam. This is concerning for metastatic disease. Increased irregular right infrahilar opacity is noted which extends into the right lower lobe which may represent inflammation or possibly malignancy, although evaluation is limited due the lack of intravenous contrast. Slightly increased left posterior basilar subsegmental atelectasis is noted. Increased right apical densities are noted concerning for worsening inflammation or possibly scarring. New mild opacity is noted posteriorly in the right lower lobe concerning for atelectasis or possibly infiltrate Enlargement of the pancreatic head is noted with possible surrounding inflammation suggesting possible pancreatitis. CT scan of the abdomen pelvis with contrast may be performed for further evaluation. New sclerotic densities are noted in the T8 and T10 vertebral bodies concerning for metastatic disease. Electronically Signed   By: Marijo Conception M.D.   On: 06/01/2021 14:20    Scheduled Meds:  sodium chloride   Intravenous Once   acyclovir  400 mg Oral BID   anastrozole  1 mg Oral Daily   vitamin C  1,000 mg Oral Daily   Chlorhexidine Gluconate Cloth  6 each Topical Daily   ferrous sulfate  325 mg Oral Daily   guaiFENesin  600 mg Oral BID   multivitamin with minerals  1 tablet Oral Daily   venlafaxine XR  37.5 mg  Oral Q breakfast   zinc sulfate  220 mg Oral Daily   Continuous Infusions:   ceFEPime (MAXIPIME) IV Stopped (06/01/21 2235)     LOS: 3 days    Time spent: 35 mins    Ramey Ketcherside, MD [facility]   If 7PM-7AM, please contact night-coverage

## 2021-06-03 LAB — CBC
HCT: 26.5 % — ABNORMAL LOW (ref 36.0–46.0)
Hemoglobin: 8.3 g/dL — ABNORMAL LOW (ref 12.0–15.0)
MCH: 29 pg (ref 26.0–34.0)
MCHC: 31.3 g/dL (ref 30.0–36.0)
MCV: 92.7 fL (ref 80.0–100.0)
Platelets: 85 10*3/uL — ABNORMAL LOW (ref 150–400)
RBC: 2.86 MIL/uL — ABNORMAL LOW (ref 3.87–5.11)
RDW: 20 % — ABNORMAL HIGH (ref 11.5–15.5)
WBC: 5.9 10*3/uL (ref 4.0–10.5)
nRBC: 0 % (ref 0.0–0.2)

## 2021-06-03 LAB — MAGNESIUM: Magnesium: 1.7 mg/dL (ref 1.7–2.4)

## 2021-06-03 LAB — BASIC METABOLIC PANEL
Anion gap: 7 (ref 5–15)
BUN: 40 mg/dL — ABNORMAL HIGH (ref 6–20)
CO2: 20 mmol/L — ABNORMAL LOW (ref 22–32)
Calcium: 10.1 mg/dL (ref 8.9–10.3)
Chloride: 107 mmol/L (ref 98–111)
Creatinine, Ser: 1.65 mg/dL — ABNORMAL HIGH (ref 0.44–1.00)
GFR, Estimated: 38 mL/min — ABNORMAL LOW (ref >60)
Glucose, Bld: 85 mg/dL (ref 70–99)
Potassium: 4.4 mmol/L (ref 3.5–5.1)
Sodium: 134 mmol/L — ABNORMAL LOW (ref 135–145)

## 2021-06-03 LAB — PHOSPHORUS: Phosphorus: 2.6 mg/dL (ref 2.5–4.6)

## 2021-06-03 LAB — LIPASE, BLOOD: Lipase: 183 U/L — ABNORMAL HIGH (ref 11–51)

## 2021-06-03 MED ORDER — BENZONATATE 100 MG PO CAPS
100.0000 mg | ORAL_CAPSULE | Freq: Three times a day (TID) | ORAL | Status: DC
  Administered 2021-06-03 – 2021-06-05 (×7): 100 mg via ORAL
  Filled 2021-06-03 (×7): qty 1

## 2021-06-03 NOTE — Progress Notes (Signed)
PROGRESS NOTE    [name] [name]  NID:[id number] DOB: 08/08/1971 DOA: 05/30/2021 PCP: Marda Stalker, PA-C   Brief Narrative:  This 50 y.o. female with PMH significant of right-sided triple positive metastatic breast cancer, TIA, morbid obesity, hypercalcemia, thrombocytopenia, CKD stage IV, hypertension who was referred to the emergency department from cancer center when she was found to have fever, low blood pressure.  Patient is on chemotherapy for breast cancer. When she presented at cancer center, she was febrile with fever of 101 F, blood pressure was low with systolic of 14E.  Patient was also noticed to be mildly tachypneic.Lab work showed AKI.  Culture sent.  Patient admitted for the suspicion of sepsis without any clear source. Started on broad-spectrum antibiotics. Hospital Course also remarkable for elevated lipase level.  Assessment & Plan:   Active Problems:   Malignant neoplasm of upper-outer quadrant of right breast in female, estrogen receptor positive ([place])   Essential hypertension   Morbid obesity with BMI of 40.0-44.9, adult (HCC)   Anemia associated with stage 4 chronic renal failure (HCC)   Sepsis (HCC)   CKD (chronic kidney disease), stage IV ([place])   Sepsis without known source: She presented with generalized weakness, tachypnea, hypotension, fever. Lactic acid level normal.   Patient is immunocompromised, started on broad-spectrum antibiotics with vancomycin and cefepime.   Cultures, NGTD.  Complains of productive cough but chest x-ray did not show any pneumonia.   MRSA PCR negative, so discontinued vancomycin. Patient had mild leukocytosis, now resolved.   She had elevated procalcitonin. Source of infection could not be identified. Continue cefepime for now. Antibiotics will be changed to oral on discharge.     AKI on CKD stage IV: > Improved. She does not follow with nephrology.  Baseline creatinine in the range of 1.6-1.9.   Patient looked dehydrated on  admission, started on IV fluid rehydration .   Renal ultrasound showed decreased echogenicity consistent with medical renal disease, no hydronephrosis.   Kidney function currently at baseline.   Chronic normocytic anemia:  Associated with CKD, malignancy, chemotherapy and iron deficiency.   Her baseline hemoglobin is in the range of 8.  She takes iron supplements.   Hemoglobin dropped to the range of 6 , transfused with 2 units of PRBC.  Currently hemoglobin stable in the range of 8.   Non-anion gap metabolic acidosis: > Resolved. Most likely secondary to renal insufficiency.  Treated with isotonic fluid with bicarb.  Continue oral bicarb supplementation on discharge.   Thrombocytopenia: Chronic.  Likely associated with malignancy/chemotherapy.   Metastatic right-sided breast cancer:  Follows with Dr. Jana Hakim, currently on chemotherapy and anastrozole.  Dr. Letta Pate  recommended outpatient follow up.   Pancreatitis: CT also showed enlargement of pancreatic head with possible surrounding inflammation ,lipase level is in the range of 200s. She denies abd pain but has Nausea and threw up. Continue IV fluids, recheck Lipase level. Lipase trending down.   Hypercalcemia:  Improved. Most likely secondary to malignancy.   Elevated liver enzymes: Chronic. Most likely associated  with malignancy / chemotherapy.  Also has elevated ALP.  All improved.   Anxiety: Continue venlafaxine.   Hypertension:  She takes losartan, carvedilol, spironolactone-hydrochlorothiazide at home, these are on hold due to severe hypotension, AKI on presentation.  Her blood pressure has remained stable without any medications.  Continue to monitor without medications at home.   Hemoptysis: Not active bleeding.  Patient reports some blood seen in her sputum.  CT chest without contrast did not show  any obvious pathology for hemoptysis.    DVT prophylaxis:  Heparin sq Code Status: Full code. Family Communication:  Daughter @ bed side. Disposition Plan:   Status is: Inpatient  Remains inpatient appropriate because:Inpatient level of care appropriate due to severity of illness  Dispo: The patient is from: Home              Anticipated d/c is to: Home on 7/4              Patient currently is not medically stable to d/c.   Difficult to place patient No  Consultants:  None  Procedures: Antimicrobials:  Anti-infectives (From admission, onward)    Start     Dose/Rate Route Frequency Ordered Stop   06/01/21 1400  vancomycin (VANCOREADY) IVPB 750 mg/150 mL  Status:  Discontinued        750 mg 150 mL/hr over 60 Minutes Intravenous Every 48 hours 05/30/21 1627 05/31/21 1141   06/01/21 1200  ceFEPIme (MAXIPIME) 2 g in sodium chloride 0.9 % 100 mL IVPB        2 g 200 mL/hr over 30 Minutes Intravenous Every 12 hours 06/01/21 0914 06/05/21 2359   05/31/21 1200  ceFEPIme (MAXIPIME) 2 g in sodium chloride 0.9 % 100 mL IVPB  Status:  Discontinued        2 g 200 mL/hr over 30 Minutes Intravenous Every 24 hours 05/30/21 1626 06/01/21 0914   05/30/21 2200  acyclovir (ZOVIRAX) tablet 400 mg        400 mg Oral 2 times daily 05/30/21 1549     05/30/21 1145  vancomycin (VANCOREADY) IVPB 1250 mg/250 mL        1,250 mg 166.7 mL/hr over 90 Minutes Intravenous  Once 05/30/21 1138 05/30/21 1840   05/30/21 1130  ceFEPIme (MAXIPIME) 2 g in sodium chloride 0.9 % 100 mL IVPB        2 g 200 mL/hr over 30 Minutes Intravenous  Once 05/30/21 1129 05/30/21 1258   05/30/21 1130  metroNIDAZOLE (FLAGYL) IVPB 500 mg        500 mg 100 mL/hr over 60 Minutes Intravenous  Once 05/30/21 1129 05/30/21 1432   05/30/21 1130  vancomycin (VANCOCIN) IVPB 1000 mg/200 mL premix  Status:  Discontinued        1,000 mg 200 mL/hr over 60 Minutes Intravenous  Once 05/30/21 1129 05/30/21 1138        Subjective: Patient was seen and examined at bedside.  Overnight events noted.   Patient denies any abdominal pain but reports having nausea  and vomiting. She denies any further hemoptysis.  Objective: Vitals:   06/02/21 0631 06/02/21 1550 06/02/21 2030 06/03/21 0440  BP: 125/61 133/75 (!) 143/85 135/62  Pulse: 86 75 76 83  Resp: 18 (!) 23 18 20   Temp: 99 F (37.2 C) 98.6 F (37 C) 98.3 F (36.8 C) 98.8 F (37.1 C)  TempSrc: Oral Oral Oral Oral  SpO2: 97% 96% 99% 96%  Weight:      Height:        Intake/Output Summary (Last 24 hours) at 06/03/2021 1139 Last data filed at 06/03/2021 0200 Gross per 24 hour  Intake 320.03 ml  Output 1 ml  Net 319.03 ml   Filed Weights   05/30/21 1039 05/30/21 1949  Weight: 78.9 kg 80.5 kg    Examination:  General exam: Appears calm and comfortable, not in any acute distress. Appears pleasant. Respiratory system: Clear to auscultation. Respiratory effort normal. Cardiovascular system: S1 &  S2 heard, RRR. No JVD, murmurs, rubs, gallops or clicks. No pedal edema. Gastrointestinal system: Abdomen is nondistended, soft and nontender. No organomegaly or masses felt. Normal bowel sounds heard. Central nervous system: Alert and oriented. No focal neurological deficits. Extremities: Symmetric 5 x 5 power.  No edema, no cyanosis, no clubbing. Skin: No rashes, lesions or ulcers Psychiatry: Judgement and insight appear normal. Mood & affect appropriate.     Data Reviewed: I have personally reviewed following labs and imaging studies  CBC: Recent Labs  Lab 05/30/21 0917 05/31/21 0615 06/01/21 0421 06/02/21 0333 06/03/21 0341  WBC 12.8* 9.2 7.5 6.2 5.9  NEUTROABS 10.7*  --  4.5 3.7  --   HGB 7.3* 6.0* 8.1* 8.4* 8.3*  HCT 22.0* 18.8* 24.7* 26.2* 26.5*  MCV 85.9 90.4 88.8 90.7 92.7  PLT 107* 87* 78* 81* 85*   Basic Metabolic Panel: Recent Labs  Lab 05/30/21 0917 05/31/21 0615 06/01/21 0421 06/02/21 0333 06/03/21 0341  NA 131* 134* 134* 134* 134*  K 5.3* 4.5 4.4 4.1 4.4  CL 108 107 105 105 107  CO2 13* 18* 22 21* 20*  GLUCOSE 137* 109* 88 86 85  BUN 65* 63* 54* 46* 40*   CREATININE 2.77* 2.43* 2.01* 1.79* 1.65*  CALCIUM 11.3* 10.1 9.8 9.9 10.1  MG  --   --   --   --  1.7  PHOS  --   --   --   --  2.6   GFR: Estimated Creatinine Clearance: 37.8 mL/min (A) (by C-G formula based on SCr of 1.65 mg/dL (H)). Liver Function Tests: Recent Labs  Lab 05/30/21 0917 05/31/21 0615 06/01/21 0421  AST 96* 65* 64*  ALT 56* 40 37  ALKPHOS 163* 116 115  BILITOT 1.8* 1.4* 1.9*  PROT 7.7 6.3* 6.3*  ALBUMIN 2.8* 2.6* 2.5*   Recent Labs  Lab 06/01/21 1523 06/02/21 0333 06/03/21 0341  LIPASE 203* 213* 183*   No results for input(s): AMMONIA in the last 168 hours. Coagulation Profile: Recent Labs  Lab 05/30/21 1214  INR 1.1   Cardiac Enzymes: No results for input(s): CKTOTAL, CKMB, CKMBINDEX, TROPONINI in the last 168 hours. BNP (last 3 results) No results for input(s): PROBNP in the last 8760 hours. HbA1C: No results for input(s): HGBA1C in the last 72 hours. CBG: No results for input(s): GLUCAP in the last 168 hours. Lipid Profile: No results for input(s): CHOL, HDL, LDLCALC, TRIG, CHOLHDL, LDLDIRECT in the last 72 hours. Thyroid Function Tests: No results for input(s): TSH, T4TOTAL, FREET4, T3FREE, THYROIDAB in the last 72 hours. Anemia Panel: No results for input(s): VITAMINB12, FOLATE, FERRITIN, TIBC, IRON, RETICCTPCT in the last 72 hours. Sepsis Labs: Recent Labs  Lab 05/30/21 1214 05/30/21 1630  PROCALCITON  --  2.28  LATICACIDVEN 1.4  --     Recent Results (from the past 240 hour(s))  Urine culture     Status: Abnormal   Collection Time: 05/30/21 11:53 AM   Specimen: In/Out Cath Urine  Result Value Ref Range Status   Specimen Description   Final    IN/OUT CATH URINE Performed at [facility] [address], [place], [place] [address]    Special Requests   Final    NONE Performed at [facility], [place] [address] [place], [place] [address]    Culture MULTIPLE SPECIES PRESENT, SUGGEST  RECOLLECTION (A)  Final   Report Status 06/01/2021 FINAL  Final  Blood Culture (routine x 2)  Status: None (Preliminary result)   Collection Time: 05/30/21 12:00 PM   Specimen: Porta Cath; Blood  Result Value Ref Range Status   Specimen Description   Final    PORTA CATH Performed at [facility] [address] [place], [place] [address]    Special Requests   Final    BOTTLES DRAWN AEROBIC AND ANAEROBIC Blood Culture adequate volume Performed at [facility] [address], [place], [place] [address]    Culture   Final    NO GROWTH 4 DAYS Performed at [facility], [place] [address] [place], [place] [address]    Report Status PENDING  Incomplete  Blood Culture (routine x 2)     Status: None (Preliminary result)   Collection Time: 05/30/21 12:10 PM   Specimen: BLOOD  Result Value Ref Range Status   Specimen Description   Final    BLOOD LEFT ANTECUBITAL Performed at [facility], [place] [address], [place], [place] [address]    Special Requests   Final    BOTTLES DRAWN AEROBIC AND ANAEROBIC Blood Culture adequate volume Performed at [place] [facility] [address], [place], [place] [address]    Culture   Final    NO GROWTH 4 DAYS Performed at [facility], [place] [address], [place], [place] [address]    Report Status PENDING  Incomplete  Resp Panel by RT-PCR (Flu A&B, Covid) Nasopharyngeal Swab     Status: None   Collection Time: 05/30/21 12:15 PM   Specimen: Nasopharyngeal Swab; Nasopharyngeal(NP) swabs in vial transport medium  Result Value Ref Range Status   SARS Coronavirus 2 by RT PCR NEGATIVE NEGATIVE Final    Comment: (NOTE) SARS-CoV-2 target nucleic acids are NOT DETECTED.  The SARS-CoV-2 RNA is generally detectable in upper respiratory specimens during the acute phase of infection. The lowest concentration of SARS-CoV-2 viral copies this assay can detect is 138 copies/mL. A negative result does  not preclude SARS-Cov-2 infection and should not be used as the sole basis for treatment or other patient management decisions. A negative result may occur with  improper specimen collection/handling, submission of specimen other than nasopharyngeal swab, presence of viral mutation(s) within the areas targeted by this assay, and inadequate number of viral copies(<138 copies/mL). A negative result must be combined with clinical observations, patient history, and epidemiological information. The expected result is Negative.  Fact Sheet for Patients:  EntrepreneurPulse.com.au  Fact Sheet for Healthcare Providers:  IncredibleEmployment.be  This test is no t yet approved or cleared by the [place] FDA and  has been authorized for detection and/or diagnosis of SARS-CoV-2 by FDA under an Emergency Use Authorization (EUA). This EUA will remain  in effect (meaning this test can be used) for the duration of the COVID-19 declaration under Section 564(b)(1) of the Act, 21 U.S.C.section 360bbb-3(b)(1), unless the authorization is terminated  or revoked sooner.       Influenza A by PCR NEGATIVE NEGATIVE Final   Influenza B by PCR NEGATIVE NEGATIVE Final    Comment: (NOTE) The Xpert Xpress SARS-CoV-2/FLU/RSV plus assay is intended as an aid in the diagnosis of influenza from Nasopharyngeal swab specimens and should not be used as a sole basis for treatment. Nasal washings and aspirates are unacceptable for Xpert Xpress SARS-CoV-2/FLU/RSV testing.  Fact Sheet for Patients: EntrepreneurPulse.com.au  Fact Sheet for Healthcare Providers: IncredibleEmployment.be  This test is not yet approved or cleared by the [place] FDA and has been  authorized for detection and/or diagnosis of SARS-CoV-2 by FDA under an Emergency Use Authorization (EUA). This EUA will remain in effect (meaning this test can be used) for the  duration of the COVID-19 declaration under Section 564(b)(1) of the Act, 21 U.S.C. section 360bbb-3(b)(1), unless the authorization is terminated or revoked.  Performed at [facility], [place] [address] [place], [place] [address]   MRSA PCR Screening     Status: None   Collection Time: 05/31/21  9:21 AM  Result Value Ref Range Status   MRSA by PCR NEGATIVE NEGATIVE Final    Comment:        The GeneXpert MRSA Assay (FDA approved for NASAL specimens only), is one component of a comprehensive MRSA colonization surveillance program. It is not intended to diagnose MRSA infection nor to guide or monitor treatment for MRSA infections. Performed at [facility], [place] [address], [place], [place] [address]     Radiology Studies: No results found.  Scheduled Meds:  sodium chloride   Intravenous Once   acyclovir  400 mg Oral BID   anastrozole  1 mg Oral Daily   vitamin C  1,000 mg Oral Daily   benzonatate  100 mg Oral TID   Chlorhexidine Gluconate Cloth  6 each Topical Daily   ferrous sulfate  325 mg Oral Daily   guaiFENesin  600 mg Oral BID   multivitamin with minerals  1 tablet Oral Daily   venlafaxine XR  37.5 mg Oral Q breakfast   zinc sulfate  220 mg Oral Daily   Continuous Infusions:  ceFEPime (MAXIPIME) IV 2 g (06/02/21 2159)     LOS: 4 days    Time spent: 25 mins    Earle Troiano, MD [facility]   If 7PM-7AM, please contact night-coverage

## 2021-06-03 NOTE — Plan of Care (Signed)
  Problem: Clinical Measurements: Goal: Diagnostic test results will improve Outcome: Progressing Goal: Signs and symptoms of infection will decrease Outcome: Progressing   Problem: Education: Goal: Knowledge of General Education information will improve Description: Including pain rating scale, medication(s)/side effects and non-pharmacologic comfort measures Outcome: Progressing   Problem: Health Behavior/Discharge Planning: Goal: Ability to manage health-related needs will improve Outcome: Progressing   Problem: Clinical Measurements: Goal: Ability to maintain clinical measurements within normal limits will improve Outcome: Progressing Goal: Will remain free from infection Outcome: Progressing Goal: Diagnostic test results will improve Outcome: Progressing Goal: Respiratory complications will improve Outcome: Progressing Goal: Cardiovascular complication will be avoided Outcome: Progressing   Problem: Nutrition: Goal: Adequate nutrition will be maintained Outcome: Progressing   Problem: Coping: Goal: Level of anxiety will decrease Outcome: Progressing   Problem: Elimination: Goal: Will not experience complications related to urinary retention Outcome: Progressing   Problem: Pain Managment: Goal: General experience of comfort will improve Outcome: Progressing   Problem: Safety: Goal: Ability to remain free from injury will improve Outcome: Progressing   Problem: Skin Integrity: Goal: Risk for impaired skin integrity will decrease Outcome: Progressing

## 2021-06-03 NOTE — Plan of Care (Signed)
  Problem: Clinical Measurements: Goal: Signs and symptoms of infection will decrease Outcome: Progressing   Problem: Health Behavior/Discharge Planning: Goal: Ability to manage health-related needs will improve Outcome: Progressing   Problem: Pain Managment: Goal: General experience of comfort will improve Outcome: Progressing

## 2021-06-03 NOTE — Progress Notes (Signed)
Pharmacy Antibiotic Note  [name] [name] is a 50 y.o. female admitted on 05/30/2021 with sepsis.  Pharmacy has been consulted for cefepime dosing.  PMH significant for metastatic breast cancer, CKD baseline SCr ~1.6-1.9. Admitted with sepsis of unknown origin, AKI.   Today, 06/03/21 WBC remains WNL SCr 1.65, improved to patients baseline. CrCl ~38 mL/min Afebrile  Today is day #5 IV antibiotics. Cultures remain negative.  Plan: Continue cefepime 2 g IV q12h Discussed with MD - continue antibiotics for a total duration of 7 days. Stop date of 7/5 added.   Renal function back to baseline, pharmacy will sign off. Please re-consult if needed.   Height: 4\' 11"  (149.9 cm) Weight: 80.5 kg (177 lb 7.5 oz) IBW/kg (Calculated) : 43.2  Temp (24hrs), Avg:98.6 F (37 C), Min:98.3 F (36.8 C), Max:98.8 F (37.1 C)  Recent Labs  Lab 05/30/21 0917 05/30/21 1214 05/31/21 0615 06/01/21 0421 06/02/21 0333 06/03/21 0341  WBC 12.8*  --  9.2 7.5 6.2 5.9  CREATININE 2.77*  --  2.43* 2.01* 1.79* 1.65*  LATICACIDVEN  --  1.4  --   --   --   --      Estimated Creatinine Clearance: 37.8 mL/min (A) (by C-G formula based on SCr of 1.65 mg/dL (H)).    Allergies  Allergen Reactions   Ace Inhibitors Swelling    Swelling of lips and face   Lisinopril Swelling    Swelling of lips, face   Amlodipine Swelling    Leg swelling   Shellfish Allergy Swelling   Adhesive [Tape] Itching and Rash    Please use paper tape   Latex Itching and Rash    Antimicrobials this admission: vancomycin 6/29 >> 6/30 cefepime 6/29 >>   Dose adjustments this admission:  Microbiology results: 6/29 BCx: NGTD 6/29 UCx: Multiple species, suggest recollection   Thank you for allowing pharmacy to be a part of this patient's care.  Lenis Noon, PharmD 06/03/2021 11:01 AM

## 2021-06-04 ENCOUNTER — Inpatient Hospital Stay (HOSPITAL_COMMUNITY): Payer: BC Managed Care – PPO

## 2021-06-04 LAB — CULTURE, BLOOD (ROUTINE X 2)
Culture: NO GROWTH
Culture: NO GROWTH
Special Requests: ADEQUATE
Special Requests: ADEQUATE

## 2021-06-04 LAB — CBC
HCT: 25.3 % — ABNORMAL LOW (ref 36.0–46.0)
Hemoglobin: 7.9 g/dL — ABNORMAL LOW (ref 12.0–15.0)
MCH: 28.9 pg (ref 26.0–34.0)
MCHC: 31.2 g/dL (ref 30.0–36.0)
MCV: 92.7 fL (ref 80.0–100.0)
Platelets: 77 10*3/uL — ABNORMAL LOW (ref 150–400)
RBC: 2.73 MIL/uL — ABNORMAL LOW (ref 3.87–5.11)
RDW: 20 % — ABNORMAL HIGH (ref 11.5–15.5)
WBC: 5.5 10*3/uL (ref 4.0–10.5)
nRBC: 0 % (ref 0.0–0.2)

## 2021-06-04 LAB — BASIC METABOLIC PANEL
Anion gap: 7 (ref 5–15)
BUN: 44 mg/dL — ABNORMAL HIGH (ref 6–20)
CO2: 19 mmol/L — ABNORMAL LOW (ref 22–32)
Calcium: 9.6 mg/dL (ref 8.9–10.3)
Chloride: 109 mmol/L (ref 98–111)
Creatinine, Ser: 1.82 mg/dL — ABNORMAL HIGH (ref 0.44–1.00)
GFR, Estimated: 34 mL/min — ABNORMAL LOW (ref >60)
Glucose, Bld: 103 mg/dL — ABNORMAL HIGH (ref 70–99)
Potassium: 4.1 mmol/L (ref 3.5–5.1)
Sodium: 135 mmol/L (ref 135–145)

## 2021-06-04 LAB — PROCALCITONIN: Procalcitonin: 1.27 ng/mL

## 2021-06-04 LAB — LIPASE, BLOOD: Lipase: 204 U/L — ABNORMAL HIGH (ref 11–51)

## 2021-06-04 MED ORDER — SODIUM BICARBONATE 650 MG PO TABS
650.0000 mg | ORAL_TABLET | Freq: Three times a day (TID) | ORAL | Status: DC
  Administered 2021-06-04 – 2021-06-05 (×4): 650 mg via ORAL
  Filled 2021-06-04 (×4): qty 1

## 2021-06-04 NOTE — Progress Notes (Signed)
PROGRESS NOTE    [name] [name]  RKY:[id number] DOB: 1971-07-06 DOA: 05/30/2021 PCP: Marda Stalker, PA-C   Brief Narrative:  This 50 y.o. female with PMH significant of right-sided triple positive metastatic breast cancer, TIA, morbid obesity, hypercalcemia, thrombocytopenia, CKD stage IV, hypertension who was referred to the emergency department from cancer center when she was found to have fever, low blood pressure.  Patient is on chemotherapy for breast cancer. When she presented at cancer center, she was febrile with fever of 101 F, blood pressure was low with systolic of 31D.  Patient was also noticed to be mildly tachypneic.Lab work showed AKI.  Culture sent.  Patient admitted for the suspicion of sepsis without any clear source. Started on broad-spectrum antibiotics. Hospital Course also remarkable for elevated lipase level.  Assessment & Plan:   Active Problems:   Malignant neoplasm of upper-outer quadrant of right breast in female, estrogen receptor positive ([place])   Essential hypertension   Morbid obesity with BMI of 40.0-44.9, adult (HCC)   Anemia associated with stage 4 chronic renal failure (HCC)   Sepsis (HCC)   CKD (chronic kidney disease), stage IV ([place])  Sepsis without known source: She presented with generalized weakness, tachypnea, hypotension, fever. Lactic acid normal.   Patient is immunocompromised, started on broad-spectrum antibiotics with vancomycin and cefepime.   Cultures, NGTD.  Complains of productive cough but chest x-ray did not show any pneumonia.   MRSA PCR negative, so discontinued vancomycin. Patient had mild leukocytosis, now resolved.   She had elevated procalcitonin. Source of infection could not be identified. Continue cefepime for now. Continue cefepime for 7 days ( Last day 06/05/21).  Sepsis physiology has resolved.   AKI on CKD stage IV: > Improved. She does not follow with nephrology.  Baseline creatinine in the range of 1.6-1.9.   Patient  looked dehydrated on admission, started on IV fluid rehydration .   Renal ultrasound showed decreased echogenicity consistent with medical renal disease, no hydronephrosis.   Kidney function currently at baseline.   Chronic normocytic anemia:  Associated with CKD, malignancy, chemotherapy and iron deficiency.   Her baseline hemoglobin is in the range of 8.  She takes iron supplements.   Hemoglobin dropped to the range of 6 , transfused with 2 units of PRBC.  Currently hemoglobin stable in the range of 8.   Non-anion gap metabolic acidosis: >Improving. Most likely secondary to renal insufficiency.  Treated with isotonic fluid with bicarb.  Continue oral bicarb supplementation on discharge.   Thrombocytopenia: Chronic.Likely associated with malignancy/chemotherapy.   Metastatic right-sided breast cancer:  Follows with Dr. Jana Hakim, currently on chemotherapy and anastrozole.  Dr. Letta Pate  recommended outpatient follow up.   Pancreatitis: CT also showed enlargement of pancreatic head with possible surrounding inflammation ,lipase level is in the range of 200s. She denies abd pain but has Nausea and threw up. Continue IV fluids, Lipase trending down.recheck Lipase level.    Hypercalcemia:  Improved. Most likely secondary to malignancy.   Elevated liver enzymes: Chronic. Most likely associated  with malignancy / chemotherapy.  Also has elevated ALP.  All improved.   Anxiety: Continue venlafaxine.   Hypertension:  She takes losartan, carvedilol, spironolactone-hydrochlorothiazide at home, these are on hold due to  hypotension, AKI on presentation.  Her blood pressure has remained stable without any medications.  Continue to monitor without medications at home.   Hemoptysis: Not active bleeding.  Patient reports some blood seen in her sputum.  CT chest without contrast did not show any  obvious pathology for hemoptysis.    DVT prophylaxis:  Heparin sq Code Status: Full code. Family  Communication: Daughter @ bed side. Disposition Plan:   Status is: Inpatient  Remains inpatient appropriate because:Inpatient level of care appropriate due to severity of illness  Dispo: The patient is from: Home              Anticipated d/c is to: Home on 06/05/21              Patient currently is not medically stable to d/c.   Difficult to place patient No  Consultants:  None  Procedures: Antimicrobials:  Anti-infectives (From admission, onward)    Start     Dose/Rate Route Frequency Ordered Stop   06/01/21 1400  vancomycin (VANCOREADY) IVPB 750 mg/150 mL  Status:  Discontinued        750 mg 150 mL/hr over 60 Minutes Intravenous Every 48 hours 05/30/21 1627 05/31/21 1141   06/01/21 1200  ceFEPIme (MAXIPIME) 2 g in sodium chloride 0.9 % 100 mL IVPB        2 g 200 mL/hr over 30 Minutes Intravenous Every 12 hours 06/01/21 0914 06/05/21 2359   05/31/21 1200  ceFEPIme (MAXIPIME) 2 g in sodium chloride 0.9 % 100 mL IVPB  Status:  Discontinued        2 g 200 mL/hr over 30 Minutes Intravenous Every 24 hours 05/30/21 1626 06/01/21 0914   05/30/21 2200  acyclovir (ZOVIRAX) tablet 400 mg        400 mg Oral 2 times daily 05/30/21 1549     05/30/21 1145  vancomycin (VANCOREADY) IVPB 1250 mg/250 mL        1,250 mg 166.7 mL/hr over 90 Minutes Intravenous  Once 05/30/21 1138 05/30/21 1840   05/30/21 1130  ceFEPIme (MAXIPIME) 2 g in sodium chloride 0.9 % 100 mL IVPB        2 g 200 mL/hr over 30 Minutes Intravenous  Once 05/30/21 1129 05/30/21 1258   05/30/21 1130  metroNIDAZOLE (FLAGYL) IVPB 500 mg        500 mg 100 mL/hr over 60 Minutes Intravenous  Once 05/30/21 1129 05/30/21 1432   05/30/21 1130  vancomycin (VANCOCIN) IVPB 1000 mg/200 mL premix  Status:  Discontinued        1,000 mg 200 mL/hr over 60 Minutes Intravenous  Once 05/30/21 1129 05/30/21 1138        Subjective: Patient was seen and examined at bedside.  Overnight events noted.   Patient denies any abdominal pain but  still reports having mild nausea and vomiting. She denies any further hemoptysis.  She denies any other concerns.  Objective: Vitals:   06/03/21 0440 06/03/21 1431 06/03/21 2017 06/04/21 0300  BP: 135/62 136/79 136/70 132/79  Pulse: 83 89 87 87  Resp: 20 20 18 16   Temp: 98.8 F (37.1 C) 99.2 F (37.3 C) 99.2 F (37.3 C) 99.3 F (37.4 C)  TempSrc: Oral Oral Oral Oral  SpO2: 96% 100% 100% 97%  Weight:      Height:        Intake/Output Summary (Last 24 hours) at 06/04/2021 1021 Last data filed at 06/04/2021 0100 Gross per 24 hour  Intake 490 ml  Output 400 ml  Net 90 ml   Filed Weights   05/30/21 1039 05/30/21 1949  Weight: 78.9 kg 80.5 kg    Examination:  General exam: Appears calm and comfortable, not in any acute distress. Appears pleasant. Respiratory system: Clear to auscultation. Respiratory  effort normal. RR 17 Cardiovascular system: S1 & S2 heard, RRR. No JVD, murmurs, rubs, gallops or clicks. No pedal edema. Gastrointestinal system: Abdomen is nondistended, soft and nontender. No organomegaly or masses felt. Normal bowel sounds heard. Central nervous system: Alert and oriented. No focal neurological deficits. Extremities: Symmetric 5 x 5 power.  No edema, no cyanosis, no clubbing. Skin: No rashes, lesions or ulcers Psychiatry: Judgement and insight appear normal. Mood & affect appropriate.     Data Reviewed: I have personally reviewed following labs and imaging studies  CBC: Recent Labs  Lab 05/30/21 0917 05/31/21 0615 06/01/21 0421 06/02/21 0333 06/03/21 0341 06/04/21 0322  WBC 12.8* 9.2 7.5 6.2 5.9 5.5  NEUTROABS 10.7*  --  4.5 3.7  --   --   HGB 7.3* 6.0* 8.1* 8.4* 8.3* 7.9*  HCT 22.0* 18.8* 24.7* 26.2* 26.5* 25.3*  MCV 85.9 90.4 88.8 90.7 92.7 92.7  PLT 107* 87* 78* 81* 85* 77*   Basic Metabolic Panel: Recent Labs  Lab 05/31/21 0615 06/01/21 0421 06/02/21 0333 06/03/21 0341 06/04/21 0322  NA 134* 134* 134* 134* 135  K 4.5 4.4 4.1 4.4 4.1   CL 107 105 105 107 109  CO2 18* 22 21* 20* 19*  GLUCOSE 109* 88 86 85 103*  BUN 63* 54* 46* 40* 44*  CREATININE 2.43* 2.01* 1.79* 1.65* 1.82*  CALCIUM 10.1 9.8 9.9 10.1 9.6  MG  --   --   --  1.7  --   PHOS  --   --   --  2.6  --    GFR: Estimated Creatinine Clearance: 34.3 mL/min (A) (by C-G formula based on SCr of 1.82 mg/dL (H)). Liver Function Tests: Recent Labs  Lab 05/30/21 0917 05/31/21 0615 06/01/21 0421  AST 96* 65* 64*  ALT 56* 40 37  ALKPHOS 163* 116 115  BILITOT 1.8* 1.4* 1.9*  PROT 7.7 6.3* 6.3*  ALBUMIN 2.8* 2.6* 2.5*   Recent Labs  Lab 06/01/21 1523 06/02/21 0333 06/03/21 0341 06/04/21 0322  LIPASE 203* 213* 183* 204*   No results for input(s): AMMONIA in the last 168 hours. Coagulation Profile: Recent Labs  Lab 05/30/21 1214  INR 1.1   Cardiac Enzymes: No results for input(s): CKTOTAL, CKMB, CKMBINDEX, TROPONINI in the last 168 hours. BNP (last 3 results) No results for input(s): PROBNP in the last 8760 hours. HbA1C: No results for input(s): HGBA1C in the last 72 hours. CBG: No results for input(s): GLUCAP in the last 168 hours. Lipid Profile: No results for input(s): CHOL, HDL, LDLCALC, TRIG, CHOLHDL, LDLDIRECT in the last 72 hours. Thyroid Function Tests: No results for input(s): TSH, T4TOTAL, FREET4, T3FREE, THYROIDAB in the last 72 hours. Anemia Panel: No results for input(s): VITAMINB12, FOLATE, FERRITIN, TIBC, IRON, RETICCTPCT in the last 72 hours. Sepsis Labs: Recent Labs  Lab 05/30/21 1214 05/30/21 1630 06/04/21 0856  PROCALCITON  --  2.28 1.27  LATICACIDVEN 1.4  --   --     Recent Results (from the past 240 hour(s))  Urine culture     Status: Abnormal   Collection Time: 05/30/21 11:53 AM   Specimen: In/Out Cath Urine  Result Value Ref Range Status   Specimen Description   Final    IN/OUT CATH URINE Performed at [facility] [address] [place], [place] [address]    Special Requests   Final     NONE Performed at [facility], [place] [address], [place], [place] [address]  Culture MULTIPLE SPECIES PRESENT, SUGGEST RECOLLECTION (A)  Final   Report Status 06/01/2021 FINAL  Final  Blood Culture (routine x 2)     Status: None   Collection Time: 05/30/21 12:00 PM   Specimen: Porta Cath; Blood  Result Value Ref Range Status   Specimen Description   Final    PORTA CATH Performed at [facility] [address] [place], [place] [address]    Special Requests   Final    BOTTLES DRAWN AEROBIC AND ANAEROBIC Blood Culture adequate volume Performed at [facility] [address], [place], [place] [address]    Culture   Final    NO GROWTH 5 DAYS Performed at [facility], [place] [address] [place], [place] [address]    Report Status 06/04/2021 FINAL  Final  Blood Culture (routine x 2)     Status: None   Collection Time: 05/30/21 12:10 PM   Specimen: BLOOD  Result Value Ref Range Status   Specimen Description   Final    BLOOD LEFT ANTECUBITAL Performed at [facility], [place] [address], [place], [place] [address]    Special Requests   Final    BOTTLES DRAWN AEROBIC AND ANAEROBIC Blood Culture adequate volume Performed at [facility] [address], [place], [place] [address]    Culture   Final    NO GROWTH 5 DAYS Performed at [facility], [place] [address] [place], [place] [address]    Report Status 06/04/2021 FINAL  Final  Resp Panel by RT-PCR (Flu A&B, Covid) Nasopharyngeal Swab     Status: None   Collection Time: 05/30/21 12:15 PM   Specimen: Nasopharyngeal Swab; Nasopharyngeal(NP) swabs in vial transport medium  Result Value Ref Range Status   SARS Coronavirus 2 by RT PCR NEGATIVE NEGATIVE Final    Comment: (NOTE) SARS-CoV-2 target nucleic acids are NOT DETECTED.  The SARS-CoV-2 RNA is generally detectable in upper respiratory specimens during the acute phase of infection. The  lowest concentration of SARS-CoV-2 viral copies this assay can detect is 138 copies/mL. A negative result does not preclude SARS-Cov-2 infection and should not be used as the sole basis for treatment or other patient management decisions. A negative result may occur with  improper specimen collection/handling, submission of specimen other than nasopharyngeal swab, presence of viral mutation(s) within the areas targeted by this assay, and inadequate number of viral copies(<138 copies/mL). A negative result must be combined with clinical observations, patient history, and epidemiological information. The expected result is Negative.  Fact Sheet for Patients:  EntrepreneurPulse.com.au  Fact Sheet for Healthcare Providers:  IncredibleEmployment.be  This test is no t yet approved or cleared by the [place] FDA and  has been authorized for detection and/or diagnosis of SARS-CoV-2 by FDA under an Emergency Use Authorization (EUA). This EUA will remain  in effect (meaning this test can be used) for the duration of the COVID-19 declaration under Section 564(b)(1) of the Act, 21 U.S.C.section 360bbb-3(b)(1), unless the authorization is terminated  or revoked sooner.       Influenza A by PCR NEGATIVE NEGATIVE Final   Influenza B by PCR NEGATIVE NEGATIVE Final    Comment: (NOTE) The Xpert Xpress SARS-CoV-2/FLU/RSV plus assay is intended as an aid in the diagnosis of influenza from Nasopharyngeal swab specimens and should not be used as a sole basis for treatment. Nasal washings and aspirates are unacceptable for Xpert Xpress SARS-CoV-2/FLU/RSV testing.  Fact Sheet for Patients:  EntrepreneurPulse.com.au  Fact Sheet for Healthcare Providers: IncredibleEmployment.be  This test is not yet approved or cleared by the [place] FDA and has been authorized for detection and/or diagnosis of SARS-CoV-2 by FDA under  an Emergency Use Authorization (EUA). This EUA will remain in effect (meaning this test can be used) for the duration of the COVID-19 declaration under Section 564(b)(1) of the Act, 21 U.S.C. section 360bbb-3(b)(1), unless the authorization is terminated or revoked.  Performed at [facility], [place] [address], [place], [place] [address]   MRSA PCR Screening     Status: None   Collection Time: 05/31/21  9:21 AM  Result Value Ref Range Status   MRSA by PCR NEGATIVE NEGATIVE Final    Comment:        The GeneXpert MRSA Assay (FDA approved for NASAL specimens only), is one component of a comprehensive MRSA colonization surveillance program. It is not intended to diagnose MRSA infection nor to guide or monitor treatment for MRSA infections. Performed at [facility], [place] [address], [place], [place] [address]     Radiology Studies: No results found.  Scheduled Meds:  sodium chloride   Intravenous Once   acyclovir  400 mg Oral BID   anastrozole  1 mg Oral Daily   vitamin C  1,000 mg Oral Daily   benzonatate  100 mg Oral TID   Chlorhexidine Gluconate Cloth  6 each Topical Daily   ferrous sulfate  325 mg Oral Daily   guaiFENesin  600 mg Oral BID   multivitamin with minerals  1 tablet Oral Daily   venlafaxine XR  37.5 mg Oral Q breakfast   zinc sulfate  220 mg Oral Daily   Continuous Infusions:  ceFEPime (MAXIPIME) IV 2 g (06/03/21 2300)     LOS: 5 days    Time spent: 25 mins    Evanie Buckle, MD [facility]   If 7PM-7AM, please contact night-coverage

## 2021-06-05 ENCOUNTER — Other Ambulatory Visit: Payer: Self-pay | Admitting: Oncology

## 2021-06-05 DIAGNOSIS — C7951 Secondary malignant neoplasm of bone: Secondary | ICD-10-CM

## 2021-06-05 DIAGNOSIS — K8689 Other specified diseases of pancreas: Secondary | ICD-10-CM

## 2021-06-05 DIAGNOSIS — C50411 Malignant neoplasm of upper-outer quadrant of right female breast: Secondary | ICD-10-CM

## 2021-06-05 DIAGNOSIS — R059 Cough, unspecified: Secondary | ICD-10-CM

## 2021-06-05 DIAGNOSIS — R1114 Bilious vomiting: Secondary | ICD-10-CM

## 2021-06-05 DIAGNOSIS — Z170 Estrogen receptor positive status [ER+]: Secondary | ICD-10-CM

## 2021-06-05 DIAGNOSIS — K858 Other acute pancreatitis without necrosis or infection: Secondary | ICD-10-CM

## 2021-06-05 LAB — BASIC METABOLIC PANEL
Anion gap: 8 (ref 5–15)
BUN: 41 mg/dL — ABNORMAL HIGH (ref 6–20)
CO2: 18 mmol/L — ABNORMAL LOW (ref 22–32)
Calcium: 9.6 mg/dL (ref 8.9–10.3)
Chloride: 110 mmol/L (ref 98–111)
Creatinine, Ser: 1.62 mg/dL — ABNORMAL HIGH (ref 0.44–1.00)
GFR, Estimated: 39 mL/min — ABNORMAL LOW (ref >60)
Glucose, Bld: 92 mg/dL (ref 70–99)
Potassium: 3.9 mmol/L (ref 3.5–5.1)
Sodium: 136 mmol/L (ref 135–145)

## 2021-06-05 LAB — PHOSPHORUS: Phosphorus: 2.1 mg/dL — ABNORMAL LOW (ref 2.5–4.6)

## 2021-06-05 LAB — CBC
HCT: 26.2 % — ABNORMAL LOW (ref 36.0–46.0)
Hemoglobin: 8.2 g/dL — ABNORMAL LOW (ref 12.0–15.0)
MCH: 29.4 pg (ref 26.0–34.0)
MCHC: 31.3 g/dL (ref 30.0–36.0)
MCV: 93.9 fL (ref 80.0–100.0)
Platelets: 82 10*3/uL — ABNORMAL LOW (ref 150–400)
RBC: 2.79 MIL/uL — ABNORMAL LOW (ref 3.87–5.11)
RDW: 20.5 % — ABNORMAL HIGH (ref 11.5–15.5)
WBC: 5.9 10*3/uL (ref 4.0–10.5)
nRBC: 0 % (ref 0.0–0.2)

## 2021-06-05 LAB — LIPASE, BLOOD: Lipase: 207 U/L — ABNORMAL HIGH (ref 11–51)

## 2021-06-05 LAB — MAGNESIUM: Magnesium: 1.8 mg/dL (ref 1.7–2.4)

## 2021-06-05 MED ORDER — SODIUM BICARBONATE 650 MG PO TABS: 650.0000 mg | ORAL_TABLET | Freq: Two times a day (BID) | ORAL | 1 refills | Status: DC

## 2021-06-05 MED ORDER — SODIUM BICARBONATE 650 MG PO TABS: 650.0000 mg | ORAL_TABLET | Freq: Three times a day (TID) | ORAL | 0 refills | Status: DC

## 2021-06-05 MED ORDER — GUAIFENESIN ER 600 MG PO TB12: 600.0000 mg | ORAL_TABLET | Freq: Two times a day (BID) | ORAL | 0 refills | Status: AC

## 2021-06-05 MED ORDER — AMOXICILLIN-POT CLAVULANATE 875-125 MG PO TABS: 1.0000 | ORAL_TABLET | Freq: Two times a day (BID) | ORAL | 0 refills | Status: DC

## 2021-06-05 MED ORDER — HEPARIN SOD (PORK) LOCK FLUSH 100 UNIT/ML IV SOLN
500.0000 [IU] | INTRAVENOUS | Status: AC | PRN
  Administered 2021-06-05: 500 [IU]

## 2021-06-05 MED ORDER — K PHOS MONO-SOD PHOS DI & MONO 155-852-130 MG PO TABS
250.0000 mg | ORAL_TABLET | Freq: Three times a day (TID) | ORAL | Status: DC
  Administered 2021-06-05: 250 mg via ORAL
  Filled 2021-06-05 (×2): qty 1

## 2021-06-05 NOTE — Progress Notes (Signed)
Burundi Casas   DOB:20-May-1971   WU#:[id number]   GNF#:[id number]  Subjective:  [place] tells me she has a dry cough. She has some pain in the right rib cage area (where she is known tohave a met). Denies nausea or vomiting at this point, tells me she is eating and drinking well, normal BMs, no abdominal pain. Mother in [place] during visit this AM   Objective: African American woman examined in recliner Vitals:   06/04/21 2143 06/05/21 0532  BP: (!) 145/85 131/77  Pulse: 88 84  Resp: 18 18  Temp: 99 F (37.2 C) 98.6 F (37 C)  SpO2: 100% 97%    Body mass index is 35.84 kg/m.  Intake/Output Summary (Last 24 hours) at 06/05/2021 0802 Last data filed at 06/05/2021 0600 Gross per 24 hour  Intake 1195.07 ml  Output --  Net 1195.07 ml     Lungs no rales or wheezes  Heart regular rate and rhythm  Abdomen soft, +BS, no tenderness to deep palpation  Neuro nonfocal  Breast exam: deferred  CBG (last 3)  No results for input(s): GLUCAP in the last 72 hours.   Labs:  Lab Results  Component Value Date   WBC 5.9 06/05/2021   HGB 8.2 (L) 06/05/2021   HCT 26.2 (L) 06/05/2021   MCV 93.9 06/05/2021   PLT 82 (L) 06/05/2021   NEUTROABS 3.7 06/02/2021    '@LASTCHEMISTRY'$ @  Urine Studies No results for input(s): UHGB, CRYS in the last 72 hours.  Invalid input(s): UACOL, UAPR, USPG, UPH, UTP, UGL, UKET, UBIL, UNIT, UROB, ULEU, UEPI, UWBC, URBC, [place], [place], [place], [place]  Basic Metabolic Panel: Recent Labs  Lab 06/01/21 0421 06/02/21 0333 06/03/21 0341 06/04/21 0322 06/05/21 0120  NA 134* 134* 134* 135 136  K 4.4 4.1 4.4 4.1 3.9  CL 105 105 107 109 110  CO2 22 21* 20* 19* 18*  GLUCOSE 88 86 85 103* 92  BUN 54* 46* 40* 44* 41*  CREATININE 2.01* 1.79* 1.65* 1.82* 1.62*  CALCIUM 9.8 9.9 10.1 9.6 9.6  MG  --   --  1.7  --  1.8  PHOS  --   --  2.6  --  2.1*   GFR Estimated Creatinine Clearance: 38.5 mL/min (A) (by C-G formula based on SCr of 1.62 mg/dL (H)). Liver Function Tests: Recent  Labs  Lab 05/30/21 0917 05/31/21 0615 06/01/21 0421  AST 96* 65* 64*  ALT 56* 40 37  ALKPHOS 163* 116 115  BILITOT 1.8* 1.4* 1.9*  PROT 7.7 6.3* 6.3*  ALBUMIN 2.8* 2.6* 2.5*   Recent Labs  Lab 06/01/21 1523 06/02/21 0333 06/03/21 0341 06/04/21 0322 06/05/21 0120  LIPASE 203* 213* 183* 204* 207*   No results for input(s): AMMONIA in the last 168 hours. Coagulation profile Recent Labs  Lab 05/30/21 1214  INR 1.1    CBC: Recent Labs  Lab 05/30/21 0917 05/31/21 0615 06/01/21 0421 06/02/21 0333 06/03/21 0341 06/04/21 0322 06/05/21 0120  WBC 12.8*   < > 7.5 6.2 5.9 5.5 5.9  NEUTROABS 10.7*  --  4.5 3.7  --   --   --   HGB 7.3*   < > 8.1* 8.4* 8.3* 7.9* 8.2*  HCT 22.0*   < > 24.7* 26.2* 26.5* 25.3* 26.2*  MCV 85.9   < > 88.8 90.7 92.7 92.7 93.9  PLT 107*   < > 78* 81* 85* 77* 82*   < > = values in this interval not displayed.   Cardiac Enzymes: No  results for input(s): CKTOTAL, CKMB, CKMBINDEX, TROPONINI in the last 168 hours. BNP: Invalid input(s): POCBNP CBG: No results for input(s): GLUCAP in the last 168 hours. D-Dimer No results for input(s): DDIMER in the last 72 hours. Hgb A1c No results for input(s): HGBA1C in the last 72 hours. Lipid Profile No results for input(s): CHOL, HDL, LDLCALC, TRIG, CHOLHDL, LDLDIRECT in the last 72 hours. Thyroid function studies No results for input(s): TSH, T4TOTAL, T3FREE, THYROIDAB in the last 72 hours.  Invalid input(s): FREET3 Anemia work up No results for input(s): VITAMINB12, FOLATE, FERRITIN, TIBC, IRON, RETICCTPCT in the last 72 hours. Microbiology Recent Results (from the past 240 hour(s))  Urine culture     Status: Abnormal   Collection Time: 05/30/21 11:53 AM   Specimen: In/Out Cath Urine  Result Value Ref Range Status   Specimen Description   Final    IN/OUT CATH URINE Performed at [facility], [place] [address], [place], [place] [address]    Special Requests   Final     NONE Performed at [facility], [place] [address] [place], [place] [address]    Culture MULTIPLE SPECIES PRESENT, SUGGEST RECOLLECTION (A)  Final   Report Status 06/01/2021 FINAL  Final  Blood Culture (routine x 2)     Status: None   Collection Time: 05/30/21 12:00 PM   Specimen: Porta Cath; Blood  Result Value Ref Range Status   Specimen Description   Final    PORTA CATH Performed at [place] [address], [place], [place] [address]    Special Requests   Final    BOTTLES DRAWN AEROBIC AND ANAEROBIC Blood Culture adequate volume Performed at [place] [address], [place], [place] [address]    Culture   Final    NO GROWTH 5 DAYS Performed at [facility], [place] [address], [place], [place] [address]    Report Status 06/04/2021 FINAL  Final  Blood Culture (routine x 2)     Status: None   Collection Time: 05/30/21 12:10 PM   Specimen: BLOOD  Result Value Ref Range Status   Specimen Description   Final    BLOOD LEFT ANTECUBITAL Performed at [facility], [place] [address] [place], [place] [address]    Special Requests   Final    BOTTLES DRAWN AEROBIC AND ANAEROBIC Blood Culture adequate volume Performed at [place] [address] [place], [place] [address]    Culture   Final    NO GROWTH 5 DAYS Performed at [facility], [place] [address], [place],  [address]    Report Status 06/04/2021 FINAL  Final  Resp Panel by RT-PCR (Flu A&B, Covid) Nasopharyngeal Swab     Status: None   Collection Time: 05/30/21 12:15 PM   Specimen: Nasopharyngeal Swab; Nasopharyngeal(NP) swabs in vial transport medium  Result Value Ref Range Status   SARS Coronavirus 2 by RT PCR NEGATIVE NEGATIVE Final    Comment: (NOTE) SARS-CoV-2 target nucleic acids are NOT DETECTED.  The SARS-CoV-2 RNA is generally detectable in upper respiratory specimens during the acute phase of infection. The  lowest concentration of SARS-CoV-2 viral copies this assay can detect is 138 copies/mL. A negative result does not preclude SARS-Cov-2 infection and should not be used as the sole basis for treatment or other patient management decisions. A negative result may occur with  improper specimen collection/handling, submission of specimen other than nasopharyngeal swab, presence of  viral mutation(s) within the areas targeted by this assay, and inadequate number of viral copies(<138 copies/mL). A negative result must be combined with clinical observations, patient history, and epidemiological information. The expected result is Negative.  Fact Sheet for Patients:  EntrepreneurPulse.com.au  Fact Sheet for Healthcare Providers:  IncredibleEmployment.be  This test is no t yet approved or cleared by the [place] FDA and  has been authorized for detection and/or diagnosis of SARS-CoV-2 by FDA under an Emergency Use Authorization (EUA). This EUA will remain  in effect (meaning this test can be used) for the duration of the COVID-19 declaration under Section 564(b)(1) of the Act, 21 U.S.C.section 360bbb-3(b)(1), unless the authorization is terminated  or revoked sooner.       Influenza A by PCR NEGATIVE NEGATIVE Final   Influenza B by PCR NEGATIVE NEGATIVE Final    Comment: (NOTE) The Xpert Xpress SARS-CoV-2/FLU/RSV plus assay is intended as an aid in the diagnosis of influenza from Nasopharyngeal swab specimens and should not be used as a sole basis for treatment. Nasal washings and aspirates are unacceptable for Xpert Xpress SARS-CoV-2/FLU/RSV testing.  Fact Sheet for Patients: EntrepreneurPulse.com.au  Fact Sheet for Healthcare Providers: IncredibleEmployment.be  This test is not yet approved or cleared by the [place] FDA and has been authorized for detection and/or diagnosis of SARS-CoV-2 by FDA under  an Emergency Use Authorization (EUA). This EUA will remain in effect (meaning this test can be used) for the duration of the COVID-19 declaration under Section 564(b)(1) of the Act, 21 U.S.C. section 360bbb-3(b)(1), unless the authorization is terminated or revoked.  Performed at [facility], [place] [address] [place], [place] [address]   MRSA PCR Screening     Status: None   Collection Time: 05/31/21  9:21 AM  Result Value Ref Range Status   MRSA by PCR NEGATIVE NEGATIVE Final    Comment:        The GeneXpert MRSA Assay (FDA approved for NASAL specimens only), is one component of a comprehensive MRSA colonization surveillance program. It is not intended to diagnose MRSA infection nor to guide or monitor treatment for MRSA infections. Performed at [facility], [place] [address] [place], [place] [address]       Studies:  DG CHEST PORT 1 VIEW  Result Date: 06/04/2021 CLINICAL DATA:  Pneumonia.  Metastatic breast cancer. EXAM: PORTABLE CHEST 1 VIEW COMPARISON:  05/30/2021 chest x-ray and 06/01/2021 chest CT FINDINGS: Patient has LEFT-sided power port, tip overlying the superior vena cava. The heart size is normal. Persistent minimal opacity in the MEDIAL RIGHT UPPER lobe and RIGHT infrahilar region. LEFT lung is clear. Prior RIGHT mastectomy and axillary node dissection. IMPRESSION: Persistent hazy density in the MEDIAL RIGHT UPPER lobe and asymmetry in the RIGHT UPPER hilar region. No new findings. Electronically Signed   By: Nolon Nations M.D.   On: 06/04/2021 11:58    Assessment: 50 y.o. [place] woman admitted with pancreatitis, cause unclear  (1) status post left lumpectomy in 2000 for a (?) T2N0 breast cancer,             (a) status post adjuvant chemotherapy             (b) status post adjuvant radiation             (c) did not receive antiestrogens   (2) genetics testing May 18, 2018 through the common Hereditary Cancers Panel +  Myelodysplastic Syndrome/Leukemia Panel found no deleterious mutations in APC,  ATM, AXIN2, BARD1, BLM, BMPR1A, BRCA1, BRCA2, BRIP1, CDH1, CDK4, CDKN2A (p14ARF), CDKN2A (p16INK4a), CEBPA, CHEK2, CTNNA1, DICER1, EPCAM*, GATA2, GREM1*, HRAS, KIT, MEN1, MLH1, MSH2, MSH3, MSH6, MUTYH, NBN, NF1, PALB2, PDGFRA, PMS2, POLD1, POLE, PTEN, RAD50, RAD51C, RAD51D, RUNX1, SDHB, SDHC, SDHD, SMAD4, SMARCA4, STK11, TERC, TERT, TP53, TSC1, TSC2, VHL The following genes were evaluated for sequence changes only: HOXB13*, NTHL1*, SDHA             (a)  3 variants of uncertain significance were identified in the genes BARD1 c.764A>G (p.Asn255Ser), BRIP1 c.2563C>T (p.Arg855Cys), and PALB2 c.3103A>T (p.Ile1035Phe).   (3) status post right breast biopsy 03/16/2018 for a clinical mT2-3 N2, anatomic stage III invasive ductal carcinoma, grade 2, triple positive, with an MIB-1 of 80%.             (a) bone scan and chest CT scan negative for metastases except for a T6 lytic lesion of concern             (b) thoracic spine MRI was ordered May 2019 but never performed.   (4) neoadjuvant chemotherapy consisting of carboplatin, docetaxel, trastuzumab and Pertuzumab every 3 weeks x 6 starting 04/16/2018             (a) pertuzumab held beginning with cycle 2 because of diarrhea   (5) trastuzumab was to be continued Q4w indefinitely, discontinued after 10/21/2019 dose with progression             (a) echocardiogram on 06/29/2018 showed an ejection fraction in the 65-70% range             (b) echocardiogram 11/19/2018 showed an ejection fraction in the 60-65% range             (c) echo 04/07/2019 shows an ejection fraction in the 55-60% range             (d) echo 07/15/2019 shows EF of 60-65%--for additional echoes see under #12 below   (6) right mastectomy and sentinel lymph node sampling on 08/26/2018 found a residual 2.5cm invasive ductal carcinoma, with 2 of 5 sentinel lymph nodes positive, ypT2, ypN1a; margins were clear              (a) repeat prognostic panel confirms tumor still triple positive (HER2 3+)   (7) adjuvant radiation 09/30/2018 - 11/16/2018  Site/dose:   The patient initially received a dose of 50.4 Gy in 28 fractions to the right chest wall and supraclavicular region. This was delivered using a 3-D conformal, 4 field technique. The patient then received a boost to the mastectomy scar. This delivered an additional 10 Gy in 5 fractions using an en face electron field. The total dose was 60.4   (8) anastrozole started 12/03/2018             (a) [place] on 10/01/2018 was 64.3, and estradiol 7.0, consistent with menopause             (b) referral to pelvic floor rehabilitation 12/03/2018             (c) [place] on 03/16/2020 was 80.8 with estradiol less than 2.5              METASTATIC DISEASE: March 2020 (9) CT of neck and CT angiography of the chest 02/04/2019 finds the T6 lytic lesion noted May 2019 (see #3 above) is now sclerotic; a sclerotic T3 lesion is stable; there is a new lytic lesion in the manubrium             (  a) PET scan 03/12/2019 shows manubrium, T3 and T5 metastases, no visceral disease             (b) biopsy of manubrial lesion planned but not done secondary to pandemic             (c) Bone scan 09/01/19 shows ? progression of disease in spine; MRI 09/02/19 shows early metastases in T10 (new), T3 and T5 stable; CT chest 09/01/19 shows 43mm pulmonary nodules that are new, but nonspecific and warrant future follow up             (d) PET 10/26/2019 shows no lung or liver lesions; new mediastinal adenopathy, bone lesions-- T-DM1 started             (e) PET scan 03/08/2020 shows resolution of the mediastinal adenopathy and hypermetabolic bone lesions, questionable subcutaneous nodule along the left lateral shoulder             (f) for additional staging studies see under "12" below   (10) SRS to spine, palliative treatment to sternum, from 05/13/2019 through 05/26/2019: 1. Chest_sternum // 40 Gy in 10 fractions 2.  Thoracic Spine (T3) // 18 Gy in 1 fraction   3. Thoracic Spine (T5) // 18 Gy in 1 fraction    (11) denosumab/Xgeva started 09/08/2019, currently given every 6 weeks   (12) T-DM1 started 01/15/20201             (a) echo 10/26/2019 shows stable EF at 55-60%             (b) echo 03/08/2020 shows an ejection fraction in the 70-75% range             (c) echo 07/12/2020 shows an ejection fraction in the 65 to 70% range.             (d) PET scan 03/08/2020 shows no active disease; a 0.9 cm subcutaneous nodule in the left shoulder area seen on the PET was not identifiable by exam 03/16/2020             (e) PET 08/24/2020 shows stable disease except for a new 0.6 cm lymph node with an SUV of 10.1             (f) bronchoscopy 09/05/2020 with endobronchial ultrasound found no detectable mass in the region noted on the September PET scan             (g) T-DM1 discontinued and trastuzumab resumed 11/29/2020             (h) PET scan 02/26/2021 shows mild nodular increase: TDM-1resumed 03/07/2021, most recent dose 05/09/2021  (i) non-contrast chest CT 06/01/2021 shows enlargement of the pancreatic head with surrounding inflammation, possible pneumonitis vs tumor RLLnew lesion at T8 and minimally increased adenopathy   Plan:  Helane's lipase remains elevated but she is asymptomatic from this-- no LLQ pain, N/V, C/D, eating and drinking normally, ambulated halls yesterday. She wants to go home and I am not uncomfortable with that. She ahs an appt with me in 2 days and we will follow lipase and other labs at that time.  I do not know the cause of her pancreatitis. T-DM1 does not cause this. CT does not mention the gallbladder or bile ducts and it was a non-contrast study. Given the evidence of progression (T8 lesion, slightly increased nodes) we will need a PET scan to reassess. I will obtain that as outpatient.  Options going forward include adding cyclin inhibitors or switching to  Enhertu. Will discuss after  pancreatitis has resolved. I have also requested outside consult with nephrology.  Please let me know if I can be of further help at this point.   Chauncey Cruel, MD 06/05/2021  8:02 AM Medical Oncology and Hematology [facility] [address] [place], [place] [address] Tel. [phone]    Fax. [phone]

## 2021-06-05 NOTE — Progress Notes (Signed)
Pt to be discharged to home today. Pt given discharge instructions including all Medications and schedules for these Medications listed on the discharge Medication list. Pt verbalized understanding of all discharge teaching. Discharge AVS paperwork with pt at time of discharge

## 2021-06-05 NOTE — Discharge Instructions (Signed)
Patient's blood pressure medications has been discontinued,  advised PCP to resume if  blood pressure remains elevated. Patient has completed 7-day treatment for possible pneumonia. Advised to follow-up with oncologist as scheduled.

## 2021-06-06 NOTE — Progress Notes (Signed)
[place]  [phone] [phone]    ID: [name] [name] DOB: 21-Dec-1970  MR#: [id number]  XIP#:[id number]  Patient Care Team: Marda Stalker, PA-C as PCP - General (Family Medicine) Jerline Pain, MD as PCP - Cardiology (Cardiology) Skylin Kennerson, Virgie Dad, MD as Consulting Physician (Oncology) Loreta Ave, MD as Referring Physician (Internal Medicine) Donato Heinz, MD as Consulting Physician (Nephrology) Kyung Rudd, MD as Consulting Physician (Radiation Oncology) Jovita Kussmaul, MD as Consulting Physician (General Surgery) Larey Dresser, MD as Consulting Physician (Cardiology) Mauro Kaufmann, RN as Oncology Nurse Navigator Rockwell Germany, RN as Oncology Nurse Navigator OTHER MD:   CHIEF COMPLAINT: Triple positive breast cancer (s/p right mastectomy)  CURRENT TREATMENT: T-DM1; anastrozole; Zometa (Q 12 w); retacrit   INTERVAL HISTORY: [name] returns today for follow-up and treatment of her triple positive breast cancer.   Since her last visit, she was admitted on 05/30/2021 for pancreatitis. Renal ultrasound that day showed: increased renal echogenicity compatible with medical renal disease; no acute finding or hydronephrosis.  She also underwent chest CT on 06/01/2021 showing: mildly increased adenopathy in aortopulmonary window of mediastinum; increased irregular right infrahilar opacity, which extends into right lower lobe; slightly increased left posterior basilar subsegmental atelectasis; increased right apical densities; new mild opacity posteriorly in right lower lobe; enlargement of pancreatic head with possible surrounding inflammation; new sclerotic densities in T8 and T10 vertebral bodies.  She continues on anastrozole.  She has no unusual side effects from this other than mild vaginal dryness which also of course is due to menopause  She last received denosumab/Xgeva on 03/28/2021.  She was switched to Zometa at slightly  reduced dose on 05/09/2021.  Referral to nephrology has been placed and PET scan ordered but not yet scheduled; she is scheduled for her next infusion 06/11/2021.  Most recent echo was 02/13/2021. Repeat has not yet been scheduled.   REVIEW OF SYSTEMS: [name] has done well since her discharge.  Specifically she has a good appetite, only mild taste alteration, normal bowel movements, absolutely no nausea vomiting or abdominal pain.  She is drinking lemon water.  She tries to get a quart and by noon but it is difficult for her.  She is walking for exercise.  She keeps a little bit of a cough, which is more a tickle in the throat, but can keep her up at night sometimes.  She has had some leg cramps.  She has had no fever, no unusual headaches, no visual changes, no balance issues, and no falls.  A detailed review of systems today was otherwise noncontributory   COVID 19 VACCINATION STATUS: [place] x2, most recently 03/2020; infection 04/2021   HISTORY OF CURRENT ILLNESS: From the original intake note:  [name] [name] palpated a lump on 01/15/2018. She felt soreness and shooting pain under her arm and in the right breast. She underwent bilateral diagnostic mammography with tomography and right breast ultrasonography at [facility] on 03/12/2018 showing: breast density category C. The is architectural distortion at the 11 o'clock position. There is also an oval mass in the right breast anterior depth. Examination of the right axilla showed enlarged lymph nodes. Ultrasonography showed a 4.6 cm mass in the right breast upper outer quadrant posterior depth. An additional 1.2 cm oval mass in the right breast 12 o'clock middle depth. The lymph nodes in the right axillary are highly suggestive of malignancy.   Accordingly on 03/16/2018 she proceeded to biopsy of the right breast area in question.  The pathology from this procedure showed ([id number]): At both the 11 and 12 o'clock positions: Invasive ductal carcinoma grade  II. Ductal carcinoma in situ, high grade, with necrosis and calcifications. Prognostic indicators significant for: estrogen receptor, 90% positive and progesterone receptor, 40% positive, both with strong staining intensity. Proliferation marker Ki67 at 80%. HER2 amplified with ratios HER2/CEP17 SIGNALS 6.90 and average HER2 copies per cell 14.50  The patient's subsequent history is as detailed below.   PAST MEDICAL HISTORY: Past Medical History:  Diagnosis Date   Anxiety    Breast cancer, left breast ([place]) 2000   Underwent lumpectomy, chemotherapy and radiation   Facial paralysis/Bells palsy    left side   Family history of lung cancer    Family history of lymphoma    Hypertension    Neuropathy    IN FINGERS AND TOES   RECENT INFUSION OF CHEMO   Personal history of breast cancer 05/06/2018   Renal disorder    just after TIA acute kidney injury   TIA (transient ischemic attack) 03/16/2017   [place] [facility]  The patient has a previous history of left breast cancer diagnosed in 2000. She was seen by [facility] in [place], [place] under Dr. Loreta Ave. According to the patient, her left breast cancer was stage II with no lymph node involvement (likely T2N0). She had chemotherapy every 3 weeks for about 6 months. She did not takes any "red drugs." She also did not take anti-estrogens (likely ER/PR negative).   TIA in 2018. She saw a neurologist as a precaution. She denies any clotting issues.    PAST SURGICAL HISTORY: Past Surgical History:  Procedure Laterality Date   BREAST LUMPECTOMY WITH AXILLARY LYMPH NODE BIOPSY Left 2000   biopsy   BREAST SURGERY     partial mastectomy with lymph node removal   ENDOBRONCHIAL ULTRASOUND Bilateral 09/05/2020   Procedure: ENDOBRONCHIAL ULTRASOUND;  Surgeon: Collene Gobble, MD;  Location: [facility];  Service: Cardiopulmonary;  Laterality: Bilateral;   MASTECTOMY Right 2019   & partial mastectomy left breast 2000   MASTECTOMY  MODIFIED RADICAL Right 08/26/2018   Procedure: MASTECTOMY MODIFIED RADICAL;  Surgeon: Jovita Kussmaul, MD;  Location: [place];  Service: General;  Laterality: Right;   MODIFIED MASTECTOMY Right 08/26/2018   PORTACATH PLACEMENT Left 04/08/2018   Procedure: INSERTION PORT-A-CATH;  Surgeon: Jovita Kussmaul, MD;  Location: [place];  Service: General;  Laterality: Left;   TUBAL LIGATION Bilateral 2004   VIDEO BRONCHOSCOPY N/A 09/05/2020   Procedure: VIDEO BRONCHOSCOPY WITHOUT FLUORO;  Surgeon: Collene Gobble, MD;  Location: [facility];  Service: Cardiopulmonary;  Laterality: N/A;   WISDOM TOOTH EXTRACTION      FAMILY HISTORY Family History  Problem Relation Age of Onset   Hypertension Mother    Hypertension Father    Diverticulosis Father    Diabetes Father    Lung cancer Father 64       hx smoking   Hypertension Maternal Grandmother    CAD Maternal Grandmother    Alzheimer's disease Maternal Grandmother        died at 62   Hypertension Paternal Grandmother    CAD Paternal Grandmother    Stroke Paternal Grandfather    Sickle cell trait Paternal Grandfather    Pneumonia Maternal Aunt        died at a young adult   Lymphoma Paternal Aunt 23  The patient's father is alive at age 30 and has a history of lung cancer. The  patient's mother is alive at age 27. The patient has 1 brother and no sisters. She denies a family history of breast or ovarian cancer.     GYNECOLOGIC HISTORY:  Patient's last menstrual period was 12/21/2017. Menarche: 50 years old Age at first live birth: 50 years old She is GXP3.  Her LMP was  January 2019.  The patient used oral contraceptive from [phone] and the Depo-provera shot from [phone] with no complications. She never used HRT.  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  She also worked at [employer] as a Occupational psychologist.  She is now disabled.  Her husband,  [name], is a Administrator. The patient's son, Shea Stakes age 32, works at [employer] in [place]. The patient's daughters, Lilia Pro age 79, and Levonne Spiller age 28, are students. Lilia Pro plans to attend [employer] in the fall. The patient's adopted son, Vonna Kotyk age 42, is also a Ship broker.    ADVANCED DIRECTIVES: In the absence of any documents to the contrary the patient's husband is her healthcare power of attorney   HEALTH MAINTENANCE: Social History   Tobacco Use   Smoking status: Never   Smokeless tobacco: Never  Vaping Use   Vaping Use: Never used  Substance Use Topics   Alcohol use: No   Drug use: No     Colonoscopy: n/a  PAP: 2015  Bone density: never done   Allergies  Allergen Reactions   Ace Inhibitors Swelling    Swelling of lips and face   Lisinopril Swelling    Swelling of lips, face   Amlodipine Swelling    Leg swelling   Shellfish Allergy Swelling   Adhesive [Tape] Itching and Rash    Please use paper tape   Latex Itching and Rash    Current Outpatient Medications  Medication Sig Dispense Refill   acyclovir (ZOVIRAX) 400 MG tablet TAKE 1 TABLET BY MOUTH TWICE A DAY (Patient taking differently: Take 400 mg by mouth daily.) 180 tablet 1   anastrozole (ARIMIDEX) 1 MG tablet Take 1 tablet (1 mg total) by mouth daily. 90 tablet 4   Ascorbic Acid (VITAMIN C) 1000 MG tablet Take 1,000 mg by mouth daily.     Chlorpheniramine-Pseudoeph (PSEUDEPHEDRINE PLUS PO) Take 1 tablet by mouth daily as needed (congestion).     cholecalciferol (VITAMIN D3) 25 MCG (1000 UNIT) tablet Take 1,000 Units by mouth daily.     Cyanocobalamin (VITAMIN B12) 500 MCG TABS Take 500 mcg by mouth daily.      ELDERBERRY PO Take 1 tablet by mouth daily.     ferrous sulfate 325 (65 FE) MG tablet Take 1 tablet (325 mg total) by mouth daily. 30 tablet 0   guaiFENesin (MUCINEX) 600 MG 12 hr tablet Take 1 tablet (600 mg total) by mouth 2 (two) times daily for 5 days. 10 tablet 0   lidocaine-prilocaine  (EMLA) cream Apply 1 application topically as needed. (Patient taking differently: Apply 1 application topically daily as needed (port access).) 30 g 6   Multiple Vitamin (MULITIVITAMIN WITH MINERALS) TABS Take 1 tablet by mouth daily.     prochlorperazine (COMPAZINE) 10 MG tablet Take 1 tablet (10 mg total) by mouth every 6 (six) hours as needed (Nausea or vomiting). (Patient not taking: No sig reported) 60 tablet 1   sodium bicarbonate 650 MG tablet Take 1 tablet (650 mg total) by mouth 2 (two) times daily.  60 tablet 1   traMADol (ULTRAM) 50 MG tablet Take 1-2 tablets (50-100 mg total) by mouth every 6 (six) hours as needed. (Patient taking differently: Take 50-100 mg by mouth every 6 (six) hours as needed for moderate pain.) 60 tablet 0   venlafaxine XR (EFFEXOR-XR) 37.5 MG 24 hr capsule TAKE 1 CAPSULE BY MOUTH DAILY WITH BREAKFAST. 90 capsule 1   No current facility-administered medications for this visit.    OBJECTIVE:  African-American woman who appears stated age  50:   06/07/21 0803  BP: (!) 109/59  Pulse: 67  Resp: 18  Temp: 97.7 F (36.5 C)  SpO2: 100%   Wt Readings from Last 3 Encounters:  06/07/21 177 lb 9.6 oz (80.6 kg)  05/30/21 177 lb 7.5 oz (80.5 kg)  05/30/21 174 lb (78.9 kg)   Body mass index is 35.87 kg/m.    ECOG FS:1 - Symptomatic but completely ambulatory  Sclerae unicteric, EOMs intact Wearing a mask No cervical or supraclavicular adenopathy Lungs no rales or rhonchi Heart regular rate and rhythm Abd soft, nontender, positive bowel sounds MSK no focal spinal tenderness, no upper extremity lymphedema Neuro: nonfocal, well oriented, appropriate affect Breasts: The right breast is status post mastectomy.  There is no evidence of local recurrence.  The left breast and both axillae are benign.   LAB RESULTS:  CMP     Component Value Date/Time   NA 136 06/05/2021 0120   K 3.9 06/05/2021 0120   CL 110 06/05/2021 0120   CO2 18 (L) 06/05/2021 0120    GLUCOSE 92 06/05/2021 0120   BUN 41 (H) 06/05/2021 0120   CREATININE 1.62 (H) 06/05/2021 0120   CREATININE 2.77 (H) 05/30/2021 0917   CALCIUM 9.6 06/05/2021 0120   CALCIUM 11.2 (H) 02/24/2020 0830   PROT 6.3 (L) 06/01/2021 0421   ALBUMIN 2.5 (L) 06/01/2021 0421   AST 64 (H) 06/01/2021 0421   AST 96 (H) 05/30/2021 0917   ALT 37 06/01/2021 0421   ALT 56 (H) 05/30/2021 0917   ALKPHOS 115 06/01/2021 0421   BILITOT 1.9 (H) 06/01/2021 0421   BILITOT 1.8 (H) 05/30/2021 0917   GFRNONAA 39 (L) 06/05/2021 0120   GFRNONAA 20 (L) 05/30/2021 0917   GFRAA 41 (L) 08/30/2020 0808   GFRAA 45 (L) 07/31/2020 1400    Lab Results  Component Value Date   TOTALPROTELP 7.0 02/25/2019     Lab Results  Component Value Date   KPAFRELGTCHN 76.7 (H) 02/25/2019   LAMBDASER 27.2 (H) 02/25/2019   KAPLAMBRATIO 2.82 (H) 02/25/2019    Lab Results  Component Value Date   WBC 5.8 06/07/2021   NEUTROABS 3.7 06/07/2021   HGB 8.1 (L) 06/07/2021   HCT 24.9 (L) 06/07/2021   MCV 91.5 06/07/2021   PLT 90 (L) 06/07/2021   No results found for: LABCA2  No components found for: [id number]  No results for input(s): INR in the last 168 hours.   No results found for: LABCA2  No results found for: NAT557  No results found for: DUK025  No results found for: KYH062  Lab Results  Component Value Date   CA2729 38.7 (H) 02/25/2019    No components found for: HGQUANT  No results found for: CEA1 / No results found for: CEA1   No results found for: AFPTUMOR  No results found for: CHROMOGRNA  No results found for: HGBA, HGBA2QUANT, HGBFQUANT, HGBSQUAN (Hemoglobinopathy evaluation)   No results found for: LDH  No results found for: IRON, TIBC, IRONPCTSAT (  Iron and TIBC)  Lab Results  Component Value Date   FERRITIN 98 12/27/2020    Urinalysis    Component Value Date/Time   COLORURINE YELLOW 05/30/2021 1153   APPEARANCEUR CLEAR 05/30/2021 1153   LABSPEC 1.010 05/30/2021 1153   PHURINE  5.0 05/30/2021 1153   GLUCOSEU NEGATIVE 05/30/2021 1153   HGBUR SMALL (A) 05/30/2021 1153   BILIRUBINUR NEGATIVE 05/30/2021 1153   [place] 05/30/2021 1153   PROTEINUR 30 (A) 05/30/2021 1153   UROBILINOGEN 0.2 11/29/2011 1656   NITRITE NEGATIVE 05/30/2021 1153   LEUKOCYTESUR NEGATIVE 05/30/2021 1153    STUDIES: CT CHEST WO CONTRAST  Result Date: 06/01/2021 CLINICAL DATA:  Hemoptysis.  History of metastatic breast cancer. EXAM: CT CHEST WITHOUT CONTRAST TECHNIQUE: Multidetector CT imaging of the chest was performed following the standard protocol without IV contrast. COMPARISON:  September 01, 2019. FINDINGS: Cardiovascular: Mild cardiomegaly is noted. No evidence of thoracic aortic aneurysm. No pericardial effusion. Mediastinum/Nodes: Thyroid gland and esophagus are unremarkable. Mildly increased adenopathy is noted in the aortopulmonary window with the largest lymph node measuring 1 cm. This is concerning for possible metastatic disease. Surgical clips are noted in both axillary regions. 11 mm lymph node is noted adjacent to distal descending thoracic aorta. Lungs/Pleura: No pneumothorax or pleural effusion is noted. Mild left posterior basilar subsegmental atelectasis is noted. Increased right apical densities are noted concerning for worsening inflammation or possibly scarring. Increased right infrahilar opacity is noted extending into the lower lobe which may represent inflammation or possibly malignancy, although evaluation is limited due to the lack of intravenous contrast. Mild opacity is noted posteriorly in the right lower lobe concerning for atelectasis or possibly infiltrate. Upper Abdomen: Enlargement of the pancreatic head is noted with possible surrounding inflammation. Musculoskeletal: Stable sclerotic density seen in T6 vertebral body concerning for metastatic disease. New sclerotic focus is noted posteriorly in T8 vertebral body concerning for metastatic disease. Larger  sclerotic density is noted posteriorly in T10 vertebral body concerning for metastatic disease. IMPRESSION: Mildly increased adenopathy is noted in aortopulmonary window of mediastinum with largest lymph node measuring 1 cm. 11 mm lymph node is noted adjacent to distal descending thoracic aorta which is also enlarged compared to prior exam. This is concerning for metastatic disease. Increased irregular right infrahilar opacity is noted which extends into the right lower lobe which may represent inflammation or possibly malignancy, although evaluation is limited due the lack of intravenous contrast. Slightly increased left posterior basilar subsegmental atelectasis is noted. Increased right apical densities are noted concerning for worsening inflammation or possibly scarring. New mild opacity is noted posteriorly in the right lower lobe concerning for atelectasis or possibly infiltrate Enlargement of the pancreatic head is noted with possible surrounding inflammation suggesting possible pancreatitis. CT scan of the abdomen pelvis with contrast may be performed for further evaluation. New sclerotic densities are noted in the T8 and T10 vertebral bodies concerning for metastatic disease. Electronically Signed   By: Marijo Conception M.D.   On: 06/01/2021 14:20   US RENAL  Result Date: 05/30/2021 CLINICAL DATA:  Acute kidney injury EXAM: RENAL / URINARY TRACT ULTRASOUND COMPLETE COMPARISON:  None. FINDINGS: Right Kidney: Renal measurements: 10.7 x 4.3 x 4.6 cm = volume: 111 mL. Increased cortical echogenicity. No hydronephrosis or focal abnormality. Left Kidney: Renal measurements: 10.9 x 4.3 x 4.2 cm = volume: 104 mL. Increased cortical echogenicity. No hydronephrosis or focal abnormality. Bladder: Ureteral jets bilaterally.  Normal appearance. Other: No free fluid. IMPRESSION: Increased renal echogenicity  compatible with medical renal disease. No acute finding or hydronephrosis. Electronically Signed   By: Jerilynn Mages.  Shick  M.D.   On: 05/30/2021 16:51   DG CHEST PORT 1 VIEW  Result Date: 06/04/2021 CLINICAL DATA:  Pneumonia.  Metastatic breast cancer. EXAM: PORTABLE CHEST 1 VIEW COMPARISON:  05/30/2021 chest x-ray and 06/01/2021 chest CT FINDINGS: Patient has LEFT-sided power port, tip overlying the superior vena cava. The heart size is normal. Persistent minimal opacity in the MEDIAL RIGHT UPPER lobe and RIGHT infrahilar region. LEFT lung is clear. Prior RIGHT mastectomy and axillary node dissection. IMPRESSION: Persistent hazy density in the MEDIAL RIGHT UPPER lobe and asymmetry in the RIGHT UPPER hilar region. No new findings. Electronically Signed   By: Nolon Nations M.D.   On: 06/04/2021 11:58   DG Chest Port 1 View  Result Date: 05/30/2021 CLINICAL DATA:  Fever and shortness of breath EXAM: PORTABLE CHEST 1 VIEW COMPARISON:  02/26/2021 FINDINGS: Cardiac shadow is within normal limits. Mild fullness in the right hilum is noted consistent with the nodule seen on prior PET-CT examination. Lungs are otherwise clear. Minimal scarring is noted in the right base. Postsurgical changes in the axilla are seen bilaterally. Left chest wall port is noted in satisfactory position. IMPRESSION: Fullness in the right hilum consistent with the known hilar nodule. No other focal abnormality is seen. Electronically Signed   By: Inez Catalina M.D.   On: 05/30/2021 11:55      ELIGIBLE FOR AVAILABLE RESEARCH PROTOCOL: no   ASSESSMENT: 28 y.o. [place] woman  (1) status post left lumpectomy in 2000 for a (?) T2N0 breast cancer,  (a) status post adjuvant chemotherapy  (b) status post adjuvant radiation  (c) did not receive antiestrogens  (2) genetics testing May 18, 2018 through the common Hereditary Cancers Panel + Myelodysplastic Syndrome/Leukemia Panel found no deleterious mutations in APC, ATM, AXIN2, BARD1, BLM, BMPR1A, BRCA1, BRCA2, BRIP1, CDH1, CDK4, CDKN2A (p14ARF), CDKN2A (p16INK4a), CEBPA, CHEK2, CTNNA1, DICER1,  EPCAM*, GATA2, GREM1*, HRAS, KIT, MEN1, MLH1, MSH2, MSH3, MSH6, MUTYH, NBN, NF1, PALB2, PDGFRA, PMS2, POLD1, POLE, PTEN, RAD50, RAD51C, RAD51D, RUNX1, SDHB, SDHC, SDHD, SMAD4, SMARCA4, STK11, TERC, TERT, TP53, TSC1, TSC2, VHL The following genes were evaluated for sequence changes only: HOXB13*, NTHL1*, SDHA  (a)  3 variants of uncertain significance were identified in the genes BARD1 c.764A>G (p.Asn255Ser), BRIP1 c.2563C>T (p.Arg855Cys), and PALB2 c.3103A>T (p.Ile1035Phe).   (3) status post right breast biopsy 03/16/2018 for a clinical mT2-3 N2, anatomic stage III invasive ductal carcinoma, grade 2, triple positive, with an MIB-1 of 80%.  (a) bone scan and chest CT scan negative for metastases except for a T6 lytic lesion of concern  (b) thoracic spine MRI was ordered May 2019 but never performed.  (4) neoadjuvant chemotherapy consisting of carboplatin, docetaxel, trastuzumab and Pertuzumab every 3 weeks x 6 starting 04/16/2018  (a) pertuzumab held beginning with cycle 2 because of diarrhea  (5) trastuzumab was to be continued Q4w indefinitely, discontinued after 10/21/2019 dose with progression  (a) echocardiogram on 06/29/2018 showed an ejection fraction in the 65-70% range  (b) echocardiogram 11/19/2018 showed an ejection fraction in the 60-65% range  (c) echo 04/07/2019 shows an ejection fraction in the 55-60% range  (d) echo 07/15/2019 shows EF of 60-65%--for additional echoes see under #12 below  (6) right mastectomy and sentinel lymph node sampling on 08/26/2018 found a residual 2.5cm invasive ductal carcinoma, with 2 of 5 sentinel lymph nodes positive, ypT2, ypN1a; margins were clear  (a) repeat prognostic panel  confirms tumor still triple positive (HER2 3+)  (7) adjuvant radiation 09/30/2018 - 11/16/2018  Site/dose:   The patient initially received a dose of 50.4 Gy in 28 fractions to the right chest wall and supraclavicular region. This was delivered using a 3-D conformal, 4 field  technique. The patient then received a boost to the mastectomy scar. This delivered an additional 10 Gy in 5 fractions using an en face electron field. The total dose was 60.4   (8) anastrozole started 12/03/2018  (a) [place] on 10/01/2018 was 64.3, and estradiol 7.0, consistent with menopause  (b) referral to pelvic floor rehabilitation 12/03/2018  (c) [place] on 03/16/2020 was 80.8 with estradiol less than 2.5   METASTATIC DISEASE: March 2020 (9) CT of neck and CT angiography of the chest 02/04/2019 finds the T6 lytic lesion noted May 2019 (see #3 above) is now sclerotic; a sclerotic T3 lesion is stable; there is a new lytic lesion in the manubrium  (a) PET scan 03/12/2019 shows manubrium, T3 and T5 metastases, no visceral disease  (b) biopsy of manubrial lesion planned but not done secondary to pandemic  (c) Bone scan 09/01/19 shows ? progression of disease in spine; MRI 09/02/19 shows early metastases in T10 (new), T3 and T5 stable; CT chest 09/01/19 shows 37mm pulmonary nodules that are new, but nonspecific and warrant future follow up  (d) PET 10/26/2019 shows no lung or liver lesions; new mediastinal adenopathy, bone lesions-- T-DM1 started  (e) PET scan 03/08/2020 shows resolution of the mediastinal adenopathy and hypermetabolic bone lesions, questionable subcutaneous nodule along the left lateral shoulder  (f) for additional staging studies see under "12" below  (10) SRS to spine, palliative treatment to sternum, from 05/13/2019 through 05/26/2019:  1. Chest_sternum // 40 Gy in 10 fractions  2. Thoracic Spine (T3) // 18 Gy in 1 fraction   3. Thoracic Spine (T5) // 18 Gy in 1 fraction   (11) denosumab/Xgeva started 09/08/2019, currently given every 6 weeks  (12) T-DM1 started 01/15/20201  (a) echo 10/26/2019 shows stable EF at 55-60%  (b) echo 03/08/2020 shows an ejection fraction in the 70-75% range  (c) echo 07/12/2020 shows an ejection fraction in the 65 to 70% range.  (d) PET scan 03/08/2020  shows no active disease; a 0.9 cm subcutaneous nodule in the left shoulder area seen on the PET was not identifiable by exam 03/16/2020  (e) PET 08/24/2020 shows stable disease except for a new 0.6 cm lymph node with an SUV of 10.1  (f) bronchoscopy 09/05/2020 with endobronchial ultrasound found no detectable mass in the region noted on the September PET scan  (g) T-DM1 discontinued and trastuzumab resumed 11/29/2020  (h) PET scan 02/26/2021 shows mild nodular increase: TDM-1resumed 03/07/2021  (i) noncontrast chest CT 06/01/2021 shows possibly mildly enlarged adenopathy, and an apparently new sclerotic lesion at T8.  There was also evidence of pancreatitis and possible pneumonitis   PLAN: [name] is entirely asymptomatic as far as the pancreas issue is concerned.  We are repeating labs today and again in 4 days when she returns for her next anti-HER2 treatment.  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  We do not have any other reason for her pancreatitis at this point.  We need a PET scan so we can really assess what is going on as far as her tumor is concerned.  We may need to go to [facility]  or perhaps go to trastuzumab alone but add abemaciclib to the antiestrogen.  In the meantime she is hydrating herself well.  We have a referral to nephrology pending.  She needs her echo repeated sometime within the next month and those orders also are in  She will see me again 07/02/2021 with her next infusion and hopefully we will have the echo the PET and the nephrology referral to review at that time  Total encounter time 35 minutes.Sarajane Jews C. Nuriya Stuck, MD 06/07/21 8:38 AM Medical Oncology and Hematology [facility] [place], [place] [address] Tel. [phone]    Fax. [phone]   I, Wilburn Mylar, am acting as scribe for Dr. Virgie Dad. Khasir [facility].  I, Lurline Del MD,  have reviewed the above documentation for accuracy and completeness, and I agree with the above.   *Total Encounter Time as defined by the Centers for Medicare and Medicaid Services includes, in addition to the face-to-face time of a patient visit (documented in the note above) non-face-to-face time: obtaining and reviewing outside history, ordering and reviewing medications, tests or procedures, care coordination (communications with other health care professionals or caregivers) and documentation in the medical record.

## 2021-06-07 ENCOUNTER — Ambulatory Visit: Payer: BC Managed Care – PPO | Admitting: Adult Health

## 2021-06-07 ENCOUNTER — Other Ambulatory Visit: Payer: Self-pay

## 2021-06-07 ENCOUNTER — Inpatient Hospital Stay: Payer: BC Managed Care – PPO | Attending: Oncology

## 2021-06-07 ENCOUNTER — Inpatient Hospital Stay (HOSPITAL_BASED_OUTPATIENT_CLINIC_OR_DEPARTMENT_OTHER): Payer: BC Managed Care – PPO | Admitting: Oncology

## 2021-06-07 ENCOUNTER — Other Ambulatory Visit: Payer: BC Managed Care – PPO

## 2021-06-07 ENCOUNTER — Telehealth: Payer: Self-pay | Admitting: Oncology

## 2021-06-07 VITALS — BP 109/59 | HR 67 | Temp 97.7°F | Resp 18 | Ht 59.0 in | Wt 177.6 lb

## 2021-06-07 DIAGNOSIS — D631 Anemia in chronic kidney disease: Secondary | ICD-10-CM | POA: Insufficient documentation

## 2021-06-07 DIAGNOSIS — C50411 Malignant neoplasm of upper-outer quadrant of right female breast: Secondary | ICD-10-CM

## 2021-06-07 DIAGNOSIS — Z170 Estrogen receptor positive status [ER+]: Secondary | ICD-10-CM

## 2021-06-07 DIAGNOSIS — Z79899 Other long term (current) drug therapy: Secondary | ICD-10-CM | POA: Insufficient documentation

## 2021-06-07 DIAGNOSIS — N184 Chronic kidney disease, stage 4 (severe): Secondary | ICD-10-CM | POA: Insufficient documentation

## 2021-06-07 DIAGNOSIS — Z5112 Encounter for antineoplastic immunotherapy: Secondary | ICD-10-CM | POA: Insufficient documentation

## 2021-06-07 DIAGNOSIS — C50811 Malignant neoplasm of overlapping sites of right female breast: Secondary | ICD-10-CM | POA: Insufficient documentation

## 2021-06-07 DIAGNOSIS — K858 Other acute pancreatitis without necrosis or infection: Secondary | ICD-10-CM

## 2021-06-07 LAB — COMPREHENSIVE METABOLIC PANEL
ALT: 56 U/L — ABNORMAL HIGH (ref 0–44)
AST: 108 U/L — ABNORMAL HIGH (ref 15–41)
Albumin: 2.4 g/dL — ABNORMAL LOW (ref 3.5–5.0)
Alkaline Phosphatase: 175 U/L — ABNORMAL HIGH (ref 38–126)
Anion gap: 9 (ref 5–15)
BUN: 50 mg/dL — ABNORMAL HIGH (ref 6–20)
CO2: 16 mmol/L — ABNORMAL LOW (ref 22–32)
Calcium: 9.6 mg/dL (ref 8.9–10.3)
Chloride: 111 mmol/L (ref 98–111)
Creatinine, Ser: 1.79 mg/dL — ABNORMAL HIGH (ref 0.44–1.00)
GFR, Estimated: 34 mL/min — ABNORMAL LOW (ref >60)
Glucose, Bld: 101 mg/dL — ABNORMAL HIGH (ref 70–99)
Potassium: 3.8 mmol/L (ref 3.5–5.1)
Sodium: 136 mmol/L (ref 135–145)
Total Bilirubin: 1 mg/dL (ref 0.3–1.2)
Total Protein: 7.1 g/dL (ref 6.5–8.1)

## 2021-06-07 LAB — AMYLASE: Amylase: 188 U/L — ABNORMAL HIGH (ref 28–100)

## 2021-06-07 LAB — CBC WITH DIFFERENTIAL/PLATELET
Abs Immature Granulocytes: 0.03 10*3/uL (ref 0.00–0.07)
Basophils Absolute: 0.1 10*3/uL (ref 0.0–0.1)
Basophils Relative: 1 %
Eosinophils Absolute: 0.3 10*3/uL (ref 0.0–0.5)
Eosinophils Relative: 5 %
HCT: 24.9 % — ABNORMAL LOW (ref 36.0–46.0)
Hemoglobin: 8.1 g/dL — ABNORMAL LOW (ref 12.0–15.0)
Immature Granulocytes: 1 %
Lymphocytes Relative: 20 %
Lymphs Abs: 1.2 10*3/uL (ref 0.7–4.0)
MCH: 29.8 pg (ref 26.0–34.0)
MCHC: 32.5 g/dL (ref 30.0–36.0)
MCV: 91.5 fL (ref 80.0–100.0)
Monocytes Absolute: 0.5 10*3/uL (ref 0.1–1.0)
Monocytes Relative: 9 %
Neutro Abs: 3.7 10*3/uL (ref 1.7–7.7)
Neutrophils Relative %: 64 %
Platelets: 90 10*3/uL — ABNORMAL LOW (ref 150–400)
RBC: 2.72 MIL/uL — ABNORMAL LOW (ref 3.87–5.11)
RDW: 20 % — ABNORMAL HIGH (ref 11.5–15.5)
WBC: 5.8 10*3/uL (ref 4.0–10.5)
nRBC: 0 % (ref 0.0–0.2)

## 2021-06-07 LAB — LIPASE, BLOOD: Lipase: 266 U/L — ABNORMAL HIGH (ref 11–51)

## 2021-06-07 NOTE — Telephone Encounter (Signed)
Scheduled appointment per 07/07 los. Patient is aware.

## 2021-06-08 ENCOUNTER — Telehealth: Payer: Self-pay | Admitting: *Deleted

## 2021-06-08 LAB — CANCER ANTIGEN 19-9: CA 19-9: 90 U/mL — ABNORMAL HIGH (ref 0–35)

## 2021-06-08 LAB — CANCER ANTIGEN 27.29: CA 27.29: 63.5 U/mL — ABNORMAL HIGH (ref 0.0–38.6)

## 2021-06-08 NOTE — Telephone Encounter (Signed)
This RN contacted [facility] office - spoke with new patient coordinator- [name]- per referral for pt to be seen asap by next available MD.  Arrie Aran states they can retrieve records from [place].  Referral with demographic face page faxed to [facility] at [phone] with confirmation as received.

## 2021-06-10 ENCOUNTER — Other Ambulatory Visit: Payer: Self-pay | Admitting: Oncology

## 2021-06-11 ENCOUNTER — Inpatient Hospital Stay: Payer: BC Managed Care – PPO

## 2021-06-11 ENCOUNTER — Other Ambulatory Visit: Payer: Self-pay

## 2021-06-11 VITALS — BP 108/62 | HR 68 | Temp 98.2°F | Resp 18 | Wt 177.4 lb

## 2021-06-11 DIAGNOSIS — K858 Other acute pancreatitis without necrosis or infection: Secondary | ICD-10-CM

## 2021-06-11 DIAGNOSIS — D631 Anemia in chronic kidney disease: Secondary | ICD-10-CM | POA: Diagnosis not present

## 2021-06-11 DIAGNOSIS — Z79899 Other long term (current) drug therapy: Secondary | ICD-10-CM | POA: Diagnosis not present

## 2021-06-11 DIAGNOSIS — Z95828 Presence of other vascular implants and grafts: Secondary | ICD-10-CM

## 2021-06-11 DIAGNOSIS — Z7189 Other specified counseling: Secondary | ICD-10-CM

## 2021-06-11 DIAGNOSIS — N184 Chronic kidney disease, stage 4 (severe): Secondary | ICD-10-CM | POA: Diagnosis not present

## 2021-06-11 DIAGNOSIS — C50411 Malignant neoplasm of upper-outer quadrant of right female breast: Secondary | ICD-10-CM | POA: Diagnosis not present

## 2021-06-11 DIAGNOSIS — Z5112 Encounter for antineoplastic immunotherapy: Secondary | ICD-10-CM | POA: Diagnosis not present

## 2021-06-11 DIAGNOSIS — C7951 Secondary malignant neoplasm of bone: Secondary | ICD-10-CM

## 2021-06-11 DIAGNOSIS — Z170 Estrogen receptor positive status [ER+]: Secondary | ICD-10-CM

## 2021-06-11 DIAGNOSIS — C50811 Malignant neoplasm of overlapping sites of right female breast: Secondary | ICD-10-CM | POA: Diagnosis not present

## 2021-06-11 LAB — COMPREHENSIVE METABOLIC PANEL
ALT: 65 U/L — ABNORMAL HIGH (ref 0–44)
AST: 115 U/L — ABNORMAL HIGH (ref 15–41)
Albumin: 2.5 g/dL — ABNORMAL LOW (ref 3.5–5.0)
Alkaline Phosphatase: 161 U/L — ABNORMAL HIGH (ref 38–126)
Anion gap: 9 (ref 5–15)
BUN: 37 mg/dL — ABNORMAL HIGH (ref 6–20)
CO2: 19 mmol/L — ABNORMAL LOW (ref 22–32)
Calcium: 10.4 mg/dL — ABNORMAL HIGH (ref 8.9–10.3)
Chloride: 112 mmol/L — ABNORMAL HIGH (ref 98–111)
Creatinine, Ser: 1.56 mg/dL — ABNORMAL HIGH (ref 0.44–1.00)
GFR, Estimated: 41 mL/min — ABNORMAL LOW (ref >60)
Glucose, Bld: 126 mg/dL — ABNORMAL HIGH (ref 70–99)
Potassium: 4.3 mmol/L (ref 3.5–5.1)
Sodium: 140 mmol/L (ref 135–145)
Total Bilirubin: 1 mg/dL (ref 0.3–1.2)
Total Protein: 7.4 g/dL (ref 6.5–8.1)

## 2021-06-11 LAB — CBC WITH DIFFERENTIAL/PLATELET
Abs Immature Granulocytes: 0.01 10*3/uL (ref 0.00–0.07)
Basophils Absolute: 0 10*3/uL (ref 0.0–0.1)
Basophils Relative: 0 %
Eosinophils Absolute: 0.1 10*3/uL (ref 0.0–0.5)
Eosinophils Relative: 1 %
HCT: 25.7 % — ABNORMAL LOW (ref 36.0–46.0)
Hemoglobin: 8.3 g/dL — ABNORMAL LOW (ref 12.0–15.0)
Immature Granulocytes: 0 %
Lymphocytes Relative: 16 %
Lymphs Abs: 0.8 10*3/uL (ref 0.7–4.0)
MCH: 29.9 pg (ref 26.0–34.0)
MCHC: 32.3 g/dL (ref 30.0–36.0)
MCV: 92.4 fL (ref 80.0–100.0)
Monocytes Absolute: 0.5 10*3/uL (ref 0.1–1.0)
Monocytes Relative: 10 %
Neutro Abs: 3.7 10*3/uL (ref 1.7–7.7)
Neutrophils Relative %: 73 %
Platelets: 103 10*3/uL — ABNORMAL LOW (ref 150–400)
RBC: 2.78 MIL/uL — ABNORMAL LOW (ref 3.87–5.11)
RDW: 20.5 % — ABNORMAL HIGH (ref 11.5–15.5)
WBC: 5.1 10*3/uL (ref 4.0–10.5)
nRBC: 0 % (ref 0.0–0.2)

## 2021-06-11 MED ORDER — EPOETIN ALFA-EPBX 40000 UNIT/ML IJ SOLN: 40000.0000 [IU] | Freq: Once | INTRAMUSCULAR | Status: DC

## 2021-06-11 MED ORDER — SODIUM CHLORIDE 0.9% FLUSH
10.0000 mL | INTRAVENOUS | Status: DC | PRN
Start: 2021-06-11 — End: 2021-06-11
  Administered 2021-06-11: 10 mL via INTRAVENOUS
  Filled 2021-06-11: qty 10

## 2021-06-11 MED ORDER — ACETAMINOPHEN 325 MG PO TABS
650.0000 mg | ORAL_TABLET | Freq: Once | ORAL | Status: AC
  Administered 2021-06-11: 650 mg via ORAL

## 2021-06-11 MED ORDER — SODIUM CHLORIDE 0.9 % IV SOLN
Freq: Once | INTRAVENOUS | Status: AC
Start: 2021-06-11 — End: 2021-06-11
  Filled 2021-06-11: qty 250

## 2021-06-11 MED ORDER — DIPHENHYDRAMINE HCL 25 MG PO CAPS
ORAL_CAPSULE | ORAL | Status: AC
  Filled 2021-06-11: qty 1

## 2021-06-11 MED ORDER — EPOETIN ALFA-EPBX 40000 UNIT/ML IJ SOLN
INTRAMUSCULAR | Status: AC
  Filled 2021-06-11: qty 1

## 2021-06-11 MED ORDER — DIPHENHYDRAMINE HCL 25 MG PO CAPS
25.0000 mg | ORAL_CAPSULE | Freq: Once | ORAL | Status: AC
Start: 2021-06-11 — End: 2021-06-11
  Administered 2021-06-11: 25 mg via ORAL

## 2021-06-11 MED ORDER — SODIUM CHLORIDE 0.9 % IV SOLN
6.0000 mg/kg | Freq: Once | INTRAVENOUS | Status: AC
Start: 2021-06-11 — End: 2021-06-11
  Administered 2021-06-11: 504 mg via INTRAVENOUS
  Filled 2021-06-11: qty 24

## 2021-06-11 MED ORDER — ACETAMINOPHEN 325 MG PO TABS
ORAL_TABLET | ORAL | Status: AC
  Filled 2021-06-11: qty 2

## 2021-06-11 MED ORDER — EPOETIN ALFA-EPBX 10000 UNIT/ML IJ SOLN
20000.0000 [IU] | Freq: Once | INTRAMUSCULAR | Status: AC
  Administered 2021-06-11: 20000 [IU] via SUBCUTANEOUS

## 2021-06-11 NOTE — Patient Instructions (Signed)
[facility]  Discharge Instructions: Thank you for choosing [facility] to provide your oncology and hematology care.   If you have a lab appointment with the Cancer Center, please go directly to the Cancer Center and check in at the registration area.   Wear comfortable clothing and clothing appropriate for easy access to any Portacath or PICC line.   We strive to give you quality time with your provider. You may need to reschedule your appointment if you arrive late (15 or more minutes).  Arriving late affects you and other patients whose appointments are after yours.  Also, if you miss three or more appointments without notifying the office, you may be dismissed from the clinic at the provider's discretion.      For prescription refill requests, have your pharmacy contact our office and allow 72 hours for refills to be completed.    Today you received the following chemotherapy and/or immunotherapy agents TRASTUZUMAB      To help prevent nausea and vomiting after your treatment, we encourage you to take your nausea medication as directed.  BELOW ARE SYMPTOMS THAT SHOULD BE REPORTED IMMEDIATELY: *FEVER GREATER THAN 100.4 F (38 C) OR HIGHER *CHILLS OR SWEATING *NAUSEA AND VOMITING THAT IS NOT CONTROLLED WITH YOUR NAUSEA MEDICATION *UNUSUAL SHORTNESS OF BREATH *UNUSUAL BRUISING OR BLEEDING *URINARY PROBLEMS (pain or burning when urinating, or frequent urination) *BOWEL PROBLEMS (unusual diarrhea, constipation, pain near the anus) TENDERNESS IN MOUTH AND THROAT WITH OR WITHOUT PRESENCE OF ULCERS (sore throat, sores in mouth, or a toothache) UNUSUAL RASH, SWELLING OR PAIN  UNUSUAL VAGINAL DISCHARGE OR ITCHING   Items with * indicate a potential emergency and should be followed up as soon as possible or go to the Emergency Department if any problems should occur.  Please show the CHEMOTHERAPY ALERT CARD or IMMUNOTHERAPY ALERT CARD at check-in to  the Emergency Department and triage nurse.  Should you have questions after your visit or need to cancel or reschedule your appointment, please contact [facility]  Dept: [phone]  and follow the prompts.  Office hours are 8:00 a.m. to 4:30 p.m. Monday - Friday. Please note that voicemails left after 4:00 p.m. may not be returned until the following business day.  We are closed weekends and major holidays. You have access to a nurse at all times for urgent questions. Please call the main number to the clinic Dept: [phone] and follow the prompts.   For any non-urgent questions, you may also contact your provider using MyChart. We now offer e-Visits for anyone 18 and older to request care online for non-urgent symptoms. For details visit [email or web address].   Also download the MyChart app! Go to the app store, search "MyChart", open the app, select [facility], and log in with your MyChart username and password.  Due to Covid, a mask is required upon entering the hospital/clinic. If you do not have a mask, one will be given to you upon arrival. For doctor visits, patients may have 1 support person aged 18 or older with them. For treatment visits, patients cannot have anyone with them due to current Covid guidelines and our immunocompromised population.   

## 2021-06-11 NOTE — Patient Instructions (Signed)

## 2021-06-13 DIAGNOSIS — C50411 Malignant neoplasm of upper-outer quadrant of right female breast: Secondary | ICD-10-CM | POA: Diagnosis not present

## 2021-06-13 DIAGNOSIS — Q453 Other congenital malformations of pancreas and pancreatic duct: Secondary | ICD-10-CM | POA: Diagnosis not present

## 2021-06-13 DIAGNOSIS — N183 Chronic kidney disease, stage 3 unspecified: Secondary | ICD-10-CM | POA: Diagnosis not present

## 2021-06-22 ENCOUNTER — Telehealth: Payer: Self-pay | Admitting: *Deleted

## 2021-06-22 ENCOUNTER — Other Ambulatory Visit: Payer: Self-pay | Admitting: *Deleted

## 2021-06-22 DIAGNOSIS — C50919 Malignant neoplasm of unspecified site of unspecified female breast: Secondary | ICD-10-CM

## 2021-06-22 DIAGNOSIS — R059 Cough, unspecified: Secondary | ICD-10-CM

## 2021-06-22 MED ORDER — ALBUTEROL SULFATE HFA 108 (90 BASE) MCG/ACT IN AERS: 2.0000 | INHALATION_SPRAY | Freq: Four times a day (QID) | RESPIRATORY_TRACT | 2 refills | Status: DC | PRN

## 2021-06-22 NOTE — Telephone Encounter (Signed)
This RN spoke with pt per her call stating recurrence of cough post discharge from the hospital.  She has augmentin in the home and was wondering if she should use it.  She is using OTC medications of robitussin, muccinex and benadryl with minimal benefit.  She denies any fevers or production from cough " just especially at night - coughing starts and then is very difficult to stop and interferes with my sleep and day time activities "  Per review with [facility]/NP recommended not to take antibiotics but order given for albuterol inhaler. Pt needs a CXR and visit next business day.  Order entered for xray and appointment.

## 2021-06-24 NOTE — Progress Notes (Signed)
[name] [name]  [phone] [phone]    ID: [name] [name] DOB: Aug 31, 1971  MR#: [id number]  GEX#:[id number]  Patient Care Team: Marda Stalker, PA-C as PCP - General (Family Medicine) Jerline Pain, MD as PCP - Cardiology (Cardiology) Deaven Barron, Virgie Dad, MD as Consulting Physician (Oncology) Loreta Ave, MD as Referring Physician (Internal Medicine) Donato Heinz, MD as Consulting Physician (Nephrology) Kyung Rudd, MD as Consulting Physician (Radiation Oncology) Jovita Kussmaul, MD as Consulting Physician (General Surgery) Larey Dresser, MD as Consulting Physician (Cardiology) Mauro Kaufmann, RN as Oncology Nurse Navigator Rockwell Germany, RN as Oncology Nurse Navigator OTHER MD:   CHIEF COMPLAINT: 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 She requested to be seen today due to increased cough.  She is scheduled for restaging PET scan on 06/28/21.  She continues on anastrozole.  She has no unusual side effects from this other than mild vaginal dryness which also of course is due to menopause  She last received denosumab/Xgeva on 03/28/2021.  She was switched to Zometa at slightly reduced dose on 05/09/2021.  She received trastuzumab 06/11/2021.  Most recent echo was 02/13/2021. Repeat has not yet been scheduled though there are 2 orders pending.  Finally she received Retacrit 06/11/2021.  This is written to be repeated every 3 weeks.  REVIEW OF SYSTEMS: [name] no longer has symptoms related to her pancreatitis.  She continues to have significant cough.  She had a chest x-ray on the way in today which is really unchanged and does not give [place] an answer.  She is actually coughing up a little blood likely because she has irritated her throat so  much with the coughing.  Despite this she is staying busy.  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  She also does some shallow water aerobics.  She has run out of tramadol and requested a refill.  Aside from that she has some discomfort in the right upper quadrant which she says occurs whenever she eats peanuts or fatty foods.  We have scheduled her to meet with nephrologist but she has not had the appointment yet.  A detailed review of systems today was otherwise stable.   COVID 19 VACCINATION STATUS: [place] x2, most recently 03/2020; infection 04/2021   HISTORY OF CURRENT ILLNESS: From the original intake note:  [name] [name] palpated a lump on 01/15/2018. She felt soreness and shooting pain under her arm and in the right breast. She underwent bilateral diagnostic mammography with tomography and right breast ultrasonography at [facility] on 03/12/2018 showing: breast density category C. The is architectural distortion at the 11 o'clock position. There is also an oval mass in the right breast anterior depth. Examination of the right axilla showed enlarged lymph nodes. Ultrasonography showed a 4.6 cm mass in the right breast upper outer quadrant posterior depth. An additional 1.2 cm oval mass in the right breast 12 o'clock middle depth. The lymph nodes in the right axillary are highly suggestive of malignancy.   Accordingly on 03/16/2018 she proceeded to biopsy of the right breast area in question. The pathology from this procedure showed ([id number]): At both the 11 and 12 o'clock positions: Invasive ductal carcinoma grade II. Ductal carcinoma in situ, high grade, with necrosis and calcifications. Prognostic indicators significant  for: estrogen receptor, 90% positive and progesterone receptor, 40% positive, both with strong staining intensity. Proliferation marker Ki67 at 80%. HER2 amplified with ratios HER2/CEP17 SIGNALS 6.90 and average HER2 copies per cell  14.50  The patient's subsequent history is as detailed below.   PAST MEDICAL HISTORY: Past Medical History:  Diagnosis Date   Anxiety    Breast cancer, left breast ([place]) 2000   Underwent lumpectomy, chemotherapy and radiation   Facial paralysis/Bells palsy    left side   Family history of lung cancer    Family history of lymphoma    Hypertension    Neuropathy    IN FINGERS AND TOES   RECENT INFUSION OF CHEMO   Personal history of breast cancer 05/06/2018   Renal disorder    just after TIA acute kidney injury   TIA (transient ischemic attack) 03/16/2017   [place] [facility]  The patient has a previous history of left breast cancer diagnosed in 2000. She was seen by [facility] in [place], [place] under Dr. Loreta Ave. According to the patient, her left breast cancer was stage II with no lymph node involvement (likely T2N0). She had chemotherapy every 3 weeks for about 6 months. She did not takes any "red drugs." She also did not take anti-estrogens (likely ER/PR negative).   TIA in 2018. She saw a neurologist as a precaution. She denies any clotting issues.    PAST SURGICAL HISTORY: Past Surgical History:  Procedure Laterality Date   BREAST LUMPECTOMY WITH AXILLARY LYMPH NODE BIOPSY Left 2000   biopsy   BREAST SURGERY     partial mastectomy with lymph node removal   ENDOBRONCHIAL ULTRASOUND Bilateral 09/05/2020   Procedure: ENDOBRONCHIAL ULTRASOUND;  Surgeon: Collene Gobble, MD;  Location: [facility];  Service: Cardiopulmonary;  Laterality: Bilateral;   MASTECTOMY Right 2019   & partial mastectomy left breast 2000   MASTECTOMY MODIFIED RADICAL Right 08/26/2018   Procedure: MASTECTOMY MODIFIED RADICAL;  Surgeon: Jovita Kussmaul, MD;  Location: [facility];  Service: General;  Laterality: Right;   MODIFIED MASTECTOMY Right 08/26/2018   PORTACATH PLACEMENT Left 04/08/2018   Procedure: INSERTION PORT-A-CATH;  Surgeon: Jovita Kussmaul, MD;  Location: [place];  Service: General;   Laterality: Left;   TUBAL LIGATION Bilateral 2004   VIDEO BRONCHOSCOPY N/A 09/05/2020   Procedure: VIDEO BRONCHOSCOPY WITHOUT FLUORO;  Surgeon: Collene Gobble, MD;  Location: [facility];  Service: Cardiopulmonary;  Laterality: N/A;   WISDOM TOOTH EXTRACTION      FAMILY HISTORY Family History  Problem Relation Age of Onset   Hypertension Mother    Hypertension Father    Diverticulosis Father    Diabetes Father    Lung cancer Father 68       hx smoking   Hypertension Maternal Grandmother    CAD Maternal Grandmother    Alzheimer's disease Maternal Grandmother        died at 60   Hypertension Paternal Grandmother    CAD Paternal Grandmother    Stroke Paternal Grandfather    Sickle cell trait Paternal Grandfather    Pneumonia Maternal Aunt        died at a young adult   Lymphoma Paternal Aunt 56  The patient's father is alive at age 78 and has a history of lung cancer. The patient's mother is alive at age 66. The patient has 1 brother and no sisters. She denies a family history of breast or ovarian cancer.     GYNECOLOGIC HISTORY:  Patient's  last menstrual period was 12/21/2017. Menarche: 50 years old Age at first live birth: 50 years old She is GXP3.  Her LMP was  January 2019.  The patient used oral contraceptive from [id number] and the Depo-provera shot from [phone] with no complications. She never used HRT.  FSH repeatedly >70 and estradiol <2.5 (from OCT 2020)  SOCIAL HISTORY:  [place] worked as a Agricultural consultant for [employer].  She also worked at [employer] as a Associate Professor.  She is now disabled.  Her husband, Ladene Artist, is a Naval architect. The patient's son, Jacquenette Shone age 37, works at [employer] in [place]. The patient's daughters, Lequita Halt age 75, and Eilene Ghazi age 61, are students. Lequita Halt plans to attend [employer] in the fall. The patient's adopted son, Ivin Booty age 33, is also a Consulting civil engineer.    ADVANCED DIRECTIVES: In the absence of any documents to  the contrary the patient's husband is her healthcare power of attorney   HEALTH MAINTENANCE: Social History   Tobacco Use   Smoking status: Never   Smokeless tobacco: Never  Vaping Use   Vaping Use: Never used  Substance Use Topics   Alcohol use: No   Drug use: No     Colonoscopy: n/a  PAP: 2015  Bone density: never done   Allergies  Allergen Reactions   Ace Inhibitors Swelling    Swelling of lips and face   Lisinopril Swelling    Swelling of lips, face   Amlodipine Swelling    Leg swelling   Shellfish Allergy Swelling   Adhesive [Tape] Itching and Rash    Please use paper tape   Latex Itching and Rash    Current Outpatient Medications  Medication Sig Dispense Refill   acyclovir (ZOVIRAX) 400 MG tablet TAKE 1 TABLET BY MOUTH TWICE A DAY (Patient taking differently: Take 400 mg by mouth daily.) 180 tablet 1   albuterol (VENTOLIN HFA) 108 (90 Base) MCG/ACT inhaler Inhale 2 puffs into the lungs every 6 (six) hours as needed for wheezing or shortness of breath. 8 g 2   anastrozole (ARIMIDEX) 1 MG tablet Take 1 tablet (1 mg total) by mouth daily. 90 tablet 4   Ascorbic Acid (VITAMIN C) 1000 MG tablet Take 1,000 mg by mouth daily.     Chlorpheniramine-Pseudoeph (PSEUDEPHEDRINE PLUS PO) Take 1 tablet by mouth daily as needed (congestion).     cholecalciferol (VITAMIN D3) 25 MCG (1000 UNIT) tablet Take 1,000 Units by mouth daily.     Cyanocobalamin (VITAMIN B12) 500 MCG TABS Take 500 mcg by mouth daily.      ELDERBERRY PO Take 1 tablet by mouth daily.     ferrous sulfate 325 (65 FE) MG tablet Take 1 tablet (325 mg total) by mouth daily. 30 tablet 0   lidocaine-prilocaine (EMLA) cream Apply 1 application topically as needed. (Patient taking differently: Apply 1 application topically daily as needed (port access).) 30 g 6   Multiple Vitamin (MULITIVITAMIN WITH MINERALS) TABS Take 1 tablet by mouth daily.     prochlorperazine (COMPAZINE) 10 MG tablet Take 1 tablet (10 mg total) by  mouth every 6 (six) hours as needed (Nausea or vomiting). (Patient not taking: No sig reported) 60 tablet 1   sodium bicarbonate 650 MG tablet Take 1 tablet (650 mg total) by mouth 2 (two) times daily. 60 tablet 1   traMADol (ULTRAM) 50 MG tablet Take 1-2 tablets (50-100 mg total) by mouth every 6 (six) hours as needed. 60 tablet 0  venlafaxine XR (EFFEXOR-XR) 37.5 MG 24 hr capsule TAKE 1 CAPSULE BY MOUTH DAILY WITH BREAKFAST. 90 capsule 1   No current facility-administered medications for this visit.    OBJECTIVE:  African-American woman who appears stated age  30:   06/25/21 1226  BP: (!) 117/52  Pulse: 60  Resp: 18  Temp: 97.7 F (36.5 C)  SpO2: 100%   Wt Readings from Last 3 Encounters:  06/25/21 172 lb 12.8 oz (78.4 kg)  06/11/21 177 lb 7 oz (80.5 kg)  06/07/21 177 lb 9.6 oz (80.6 kg)   Body mass index is 34.9 kg/m.    ECOG FS:1 - Symptomatic but completely ambulatory  Sclerae unicteric, EOMs intact Wearing a mask No cervical or supraclavicular adenopathy Lungs no rales or rhonchi Heart regular rate and rhythm Abd soft, nontender, positive bowel sounds MSK no focal spinal tenderness, no upper extremity lymphedema Neuro: nonfocal, well oriented, appropriate affect Breasts: Deferred   LAB RESULTS:  CMP     Component Value Date/Time   NA 140 06/11/2021 0803   K 4.3 06/11/2021 0803   CL 112 (H) 06/11/2021 0803   CO2 19 (L) 06/11/2021 0803   GLUCOSE 126 (H) 06/11/2021 0803   BUN 37 (H) 06/11/2021 0803   CREATININE 1.56 (H) 06/11/2021 0803   CREATININE 2.77 (H) 05/30/2021 0917   CALCIUM 10.4 (H) 06/11/2021 0803   CALCIUM 11.2 (H) 02/24/2020 0830   PROT 7.4 06/11/2021 0803   ALBUMIN 2.5 (L) 06/11/2021 0803   AST 115 (H) 06/11/2021 0803   AST 96 (H) 05/30/2021 0917   ALT 65 (H) 06/11/2021 0803   ALT 56 (H) 05/30/2021 0917   ALKPHOS 161 (H) 06/11/2021 0803   BILITOT 1.0 06/11/2021 0803   BILITOT 1.8 (H) 05/30/2021 0917   GFRNONAA 41 (L) 06/11/2021 0803    GFRNONAA 20 (L) 05/30/2021 0917   GFRAA 41 (L) 08/30/2020 0808   GFRAA 45 (L) 07/31/2020 1400    Lab Results  Component Value Date   TOTALPROTELP 7.0 02/25/2019     Lab Results  Component Value Date   KPAFRELGTCHN 76.7 (H) 02/25/2019   LAMBDASER 27.2 (H) 02/25/2019   KAPLAMBRATIO 2.82 (H) 02/25/2019    Lab Results  Component Value Date   WBC 5.1 06/11/2021   NEUTROABS 3.7 06/11/2021   HGB 8.3 (L) 06/11/2021   HCT 25.7 (L) 06/11/2021   MCV 92.4 06/11/2021   PLT 103 (L) 06/11/2021   No results found for: LABCA2  No components found for: [id number]  No results for input(s): INR in the last 168 hours.   No results found for: LABCA2  Lab Results  Component Value Date   CAN199 90 (H) 06/07/2021    No results found for: HFS142  No results found for: LTR320  Lab Results  Component Value Date   CA2729 63.5 (H) 06/07/2021    No components found for: HGQUANT  No results found for: CEA1 / No results found for: CEA1   No results found for: AFPTUMOR  No results found for: CHROMOGRNA  No results found for: HGBA, HGBA2QUANT, HGBFQUANT, HGBSQUAN (Hemoglobinopathy evaluation)   No results found for: LDH  No results found for: IRON, TIBC, IRONPCTSAT (Iron and TIBC)  Lab Results  Component Value Date   FERRITIN 98 12/27/2020    Urinalysis    Component Value Date/Time   COLORURINE YELLOW 05/30/2021 [place] 05/30/2021 1153   LABSPEC 1.010 05/30/2021 1153   PHURINE 5.0 05/30/2021 [place] 05/30/2021 1153  HGBUR SMALL (A) 05/30/2021 1153   BILIRUBINUR NEGATIVE 05/30/2021 1153   [place] 05/30/2021 1153   PROTEINUR 30 (A) 05/30/2021 1153   UROBILINOGEN 0.2 11/29/2011 1656   NITRITE NEGATIVE 05/30/2021 1153   LEUKOCYTESUR NEGATIVE 05/30/2021 1153    STUDIES: DG Chest 2 View  Result Date: 06/25/2021 CLINICAL DATA:  Cough.  History stage IV breast cancer.  Hemoptysis. EXAM: CHEST - 2 VIEW COMPARISON:   06/04/2021 FINDINGS: Left chest wall port a catheter is noted with tip in the projection of the SVC surgical clips are noted within bilateral axilla and right hilar region. Unchanged interstitial prominence within the right upper lobe which is favored to represent postinflammatory change. No pleural effusion or edema. No superimposed airspace consolidation. Visualized osseous structures are unremarkable IMPRESSION: No acute cardiopulmonary abnormalities. Electronically Signed   By: Kerby Moors M.D.   On: 06/25/2021 12:32   CT CHEST WO CONTRAST  Result Date: 06/01/2021 CLINICAL DATA:  Hemoptysis.  History of metastatic breast cancer. EXAM: CT CHEST WITHOUT CONTRAST TECHNIQUE: Multidetector CT imaging of the chest was performed following the standard protocol without IV contrast. COMPARISON:  September 01, 2019. FINDINGS: Cardiovascular: Mild cardiomegaly is noted. No evidence of thoracic aortic aneurysm. No pericardial effusion. Mediastinum/Nodes: Thyroid gland and esophagus are unremarkable. Mildly increased adenopathy is noted in the aortopulmonary window with the largest lymph node measuring 1 cm. This is concerning for possible metastatic disease. Surgical clips are noted in both axillary regions. 11 mm lymph node is noted adjacent to distal descending thoracic aorta. Lungs/Pleura: No pneumothorax or pleural effusion is noted. Mild left posterior basilar subsegmental atelectasis is noted. Increased right apical densities are noted concerning for worsening inflammation or possibly scarring. Increased right infrahilar opacity is noted extending into the lower lobe which may represent inflammation or possibly malignancy, although evaluation is limited due to the lack of intravenous contrast. Mild opacity is noted posteriorly in the right lower lobe concerning for atelectasis or possibly infiltrate. Upper Abdomen: Enlargement of the pancreatic head is noted with possible surrounding inflammation.  Musculoskeletal: Stable sclerotic density seen in T6 vertebral body concerning for metastatic disease. New sclerotic focus is noted posteriorly in T8 vertebral body concerning for metastatic disease. Larger sclerotic density is noted posteriorly in T10 vertebral body concerning for metastatic disease. IMPRESSION: Mildly increased adenopathy is noted in aortopulmonary window of mediastinum with largest lymph node measuring 1 cm. 11 mm lymph node is noted adjacent to distal descending thoracic aorta which is also enlarged compared to prior exam. This is concerning for metastatic disease. Increased irregular right infrahilar opacity is noted which extends into the right lower lobe which may represent inflammation or possibly malignancy, although evaluation is limited due the lack of intravenous contrast. Slightly increased left posterior basilar subsegmental atelectasis is noted. Increased right apical densities are noted concerning for worsening inflammation or possibly scarring. New mild opacity is noted posteriorly in the right lower lobe concerning for atelectasis or possibly infiltrate Enlargement of the pancreatic head is noted with possible surrounding inflammation suggesting possible pancreatitis. CT scan of the abdomen pelvis with contrast may be performed for further evaluation. New sclerotic densities are noted in the T8 and T10 vertebral bodies concerning for metastatic disease. Electronically Signed   By: Marijo Conception M.D.   On: 06/01/2021 14:20   US RENAL  Result Date: 05/30/2021 CLINICAL DATA:  Acute kidney injury EXAM: RENAL / URINARY TRACT ULTRASOUND COMPLETE COMPARISON:  None. FINDINGS: Right Kidney: Renal measurements: 10.7 x 4.3 x 4.6 cm =  volume: 111 mL. Increased cortical echogenicity. No hydronephrosis or focal abnormality. Left Kidney: Renal measurements: 10.9 x 4.3 x 4.2 cm = volume: 104 mL. Increased cortical echogenicity. No hydronephrosis or focal abnormality. Bladder: Ureteral jets  bilaterally.  Normal appearance. Other: No free fluid. IMPRESSION: Increased renal echogenicity compatible with medical renal disease. No acute finding or hydronephrosis. Electronically Signed   By: Jerilynn Mages.  Shick M.D.   On: 05/30/2021 16:51   DG CHEST PORT 1 VIEW  Result Date: 06/04/2021 CLINICAL DATA:  Pneumonia.  Metastatic breast cancer. EXAM: PORTABLE CHEST 1 VIEW COMPARISON:  05/30/2021 chest x-ray and 06/01/2021 chest CT FINDINGS: Patient has LEFT-sided power port, tip overlying the superior vena cava. The heart size is normal. Persistent minimal opacity in the MEDIAL RIGHT UPPER lobe and RIGHT infrahilar region. LEFT lung is clear. Prior RIGHT mastectomy and axillary node dissection. IMPRESSION: Persistent hazy density in the MEDIAL RIGHT UPPER lobe and asymmetry in the RIGHT UPPER hilar region. No new findings. Electronically Signed   By: Nolon Nations M.D.   On: 06/04/2021 11:58   DG Chest Port 1 View  Result Date: 05/30/2021 CLINICAL DATA:  Fever and shortness of breath EXAM: PORTABLE CHEST 1 VIEW COMPARISON:  02/26/2021 FINDINGS: Cardiac shadow is within normal limits. Mild fullness in the right hilum is noted consistent with the nodule seen on prior PET-CT examination. Lungs are otherwise clear. Minimal scarring is noted in the right base. Postsurgical changes in the axilla are seen bilaterally. Left chest wall port is noted in satisfactory position. IMPRESSION: Fullness in the right hilum consistent with the known hilar nodule. No other focal abnormality is seen. Electronically Signed   By: Inez Catalina M.D.   On: 05/30/2021 11:55      ELIGIBLE FOR AVAILABLE RESEARCH PROTOCOL: no   ASSESSMENT: 50 y.o. [place], [place] woman  (1) status post left lumpectomy in 2000 for a (?) T2N0 breast cancer,  (a) status post adjuvant chemotherapy  (b) status post adjuvant radiation  (c) did not receive antiestrogens  (2) genetics testing May 18, 2018 through the common Hereditary Cancers Panel +  Myelodysplastic Syndrome/Leukemia Panel found no deleterious mutations in APC, ATM, AXIN2, BARD1, BLM, BMPR1A, BRCA1, BRCA2, BRIP1, CDH1, CDK4, CDKN2A (p14ARF), CDKN2A (p16INK4a), CEBPA, CHEK2, CTNNA1, DICER1, EPCAM*, GATA2, GREM1*, HRAS, KIT, MEN1, MLH1, MSH2, MSH3, MSH6, MUTYH, NBN, NF1, PALB2, PDGFRA, PMS2, POLD1, POLE, PTEN, RAD50, RAD51C, RAD51D, RUNX1, SDHB, SDHC, SDHD, SMAD4, SMARCA4, STK11, TERC, TERT, TP53, TSC1, TSC2, VHL The following genes were evaluated for sequence changes only: HOXB13*, NTHL1*, SDHA  (a)  3 variants of uncertain significance were identified in the genes BARD1 c.764A>G (p.Asn255Ser), BRIP1 c.2563C>T (p.Arg855Cys), and PALB2 c.3103A>T (p.Ile1035Phe).   (3) status post right breast biopsy 03/16/2018 for a clinical mT2-3 N2, anatomic stage III invasive ductal carcinoma, grade 2, triple positive, with an MIB-1 of 80%.  (a) bone scan and chest CT scan negative for metastases except for a T6 lytic lesion of concern  (b) thoracic spine MRI was ordered May 2019 but never performed.  (4) neoadjuvant chemotherapy consisting of carboplatin, docetaxel, trastuzumab and Pertuzumab every 3 weeks x 6 starting 04/16/2018  (a) pertuzumab held beginning with cycle 2 because of diarrhea  (5) trastuzumab was to be continued Q4w indefinitely, discontinued after 10/21/2019 dose with progression  (a) echocardiogram on 06/29/2018 showed an ejection fraction in the 65-70% range  (b) echocardiogram 11/19/2018 showed an ejection fraction in the 60-65% range  (c) echo 04/07/2019 shows an ejection fraction in the 55-60% range  (d)  echo 07/15/2019 shows EF of 60-65%--for additional echoes see under #12 below  (6) right mastectomy and sentinel lymph node sampling on 08/26/2018 found a residual 2.5cm invasive ductal carcinoma, with 2 of 5 sentinel lymph nodes positive, ypT2, ypN1a; margins were clear  (a) repeat prognostic panel confirms tumor still triple positive (HER2 3+)  (7) adjuvant radiation  09/30/2018 - 11/16/2018  Site/dose:   The patient initially received a dose of 50.4 Gy in 28 fractions to the right chest wall and supraclavicular region. This was delivered using a 3-D conformal, 4 field technique. The patient then received a boost to the mastectomy scar. This delivered an additional 10 Gy in 5 fractions using an en face electron field. The total dose was 60.4   (8) anastrozole started 12/03/2018  (a) [place] on 10/01/2018 was 64.3, and estradiol 7.0, consistent with menopause  (b) referral to pelvic floor rehabilitation 12/03/2018  (c) [place] on 03/16/2020 was 80.8 with estradiol less than 2.5   METASTATIC DISEASE: March 2020 (9) CT of neck and CT angiography of the chest 02/04/2019 finds the T6 lytic lesion noted May 2019 (see #3 above) is now sclerotic; a sclerotic T3 lesion is stable; there is a new lytic lesion in the manubrium  (a) PET scan 03/12/2019 shows manubrium, T3 and T5 metastases, no visceral disease  (b) biopsy of manubrial lesion planned but not done secondary to pandemic  (c) Bone scan 09/01/19 shows ? progression of disease in spine; MRI 09/02/19 shows early metastases in T10 (new), T3 and T5 stable; CT chest 09/01/19 shows 59mm pulmonary nodules that are new, but nonspecific and warrant future follow up  (d) PET 10/26/2019 shows no lung or liver lesions; new mediastinal adenopathy, bone lesions-- T-DM1 started  (e) PET scan 03/08/2020 shows resolution of the mediastinal adenopathy and hypermetabolic bone lesions, questionable subcutaneous nodule along the left lateral shoulder  (f) for additional staging studies see under "12" below  (10) SRS to spine, palliative treatment to sternum, from 05/13/2019 through 05/26/2019:  1. Chest_sternum // 40 Gy in 10 fractions  2. Thoracic Spine (T3) // 18 Gy in 1 fraction   3. Thoracic Spine (T5) // 18 Gy in 1 fraction   (11) denosumab/Xgeva started 09/08/2019, currently given every 6 weeks  (12) T-DM1 started 01/15/20201  (a)  echo 10/26/2019 shows stable EF at 55-60%  (b) echo 03/08/2020 shows an ejection fraction in the 70-75% range  (c) echo 07/12/2020 shows an ejection fraction in the 65 to 70% range.  (d) PET scan 03/08/2020 shows no active disease; a 0.9 cm subcutaneous nodule in the left shoulder area seen on the PET was not identifiable by exam 03/16/2020  (e) PET 08/24/2020 shows stable disease except for a new 0.6 cm lymph node with an SUV of 10.1  (f) bronchoscopy 09/05/2020 with endobronchial ultrasound found no detectable mass in the region noted on the September PET scan  (g) T-DM1 discontinued and trastuzumab resumed 11/29/2020  (h) PET scan 02/26/2021 shows mild nodular increase: TDM-1 resumed 03/07/2021, discontinued after 05/09/2021 dose pending restaging studies  (i) noncontrast chest CT 06/01/2021 shows possibly mildly enlarged adenopathy, and an apparently new sclerotic lesion at T8.  There was also evidence of pancreatitis and possible pneumonitis   PLAN: [name] appears to have fully recovered from her pancreatitis.  We will be checking a lipase and amylase at the next lab draw.  She continues to have a persistent cough.  I am adding omeprazole today in case that is helpful.  Various antitussives  have not provided her any relief.  She is now making a big effort to drink more fluids and her creatinine is  improved as a result.  I encouraged her to continue that and also her walking and exercise program which she has just started.  The question is what is actually going on with her cancer.  We will know that later this week when she has her PET scan.  I will do my best to get her those results as soon as they are available but she understands I will be out of town this coming weekend.  She is already scheduled to see me again in 1 week and at that point she may return to TDM 1, continue trastuzumab, or switch to [place] depending on the PET scan results.  She knows to call for any other issue that  may develop before the next visit  Total encounter time 35 minutes.Sarajane Jews C. Carlis Burnsworth, MD 06/25/21 5:37 PM Medical Oncology and Hematology [facility] [place], [place] [address] Tel. [phone]    Fax. [phone]   I, Wilburn Mylar, am acting as scribe for Dr. Virgie Dad. Hila Bolding.  I, Lurline Del MD, have reviewed the above documentation for accuracy and completeness, and I agree with the above.   *Total Encounter Time as defined by the Centers for Medicare and Medicaid Services includes, in addition to the face-to-face time of a patient visit (documented in the note above) non-face-to-face time: obtaining and reviewing outside history, ordering and reviewing medications, tests or procedures, care coordination (communications with other health care professionals or caregivers) and documentation in the medical record.

## 2021-06-25 ENCOUNTER — Ambulatory Visit (HOSPITAL_COMMUNITY)
Admission: RE | Admit: 2021-06-25 | Discharge: 2021-06-25 | Disposition: A | Discharge status: Home / Self Care | Payer: BC Managed Care – PPO | Source: Ambulatory Visit | Attending: Oncology | Admitting: Oncology

## 2021-06-25 ENCOUNTER — Inpatient Hospital Stay (HOSPITAL_BASED_OUTPATIENT_CLINIC_OR_DEPARTMENT_OTHER): Payer: BC Managed Care – PPO | Admitting: Oncology

## 2021-06-25 ENCOUNTER — Other Ambulatory Visit: Payer: Self-pay

## 2021-06-25 VITALS — BP 117/52 | HR 60 | Temp 97.7°F | Resp 18 | Ht 59.0 in | Wt 172.8 lb

## 2021-06-25 DIAGNOSIS — K859 Acute pancreatitis without necrosis or infection, unspecified: Secondary | ICD-10-CM | POA: Insufficient documentation

## 2021-06-25 DIAGNOSIS — C50919 Malignant neoplasm of unspecified site of unspecified female breast: Secondary | ICD-10-CM | POA: Diagnosis not present

## 2021-06-25 DIAGNOSIS — Z170 Estrogen receptor positive status [ER+]: Secondary | ICD-10-CM | POA: Diagnosis not present

## 2021-06-25 DIAGNOSIS — C7951 Secondary malignant neoplasm of bone: Secondary | ICD-10-CM

## 2021-06-25 DIAGNOSIS — C50811 Malignant neoplasm of overlapping sites of right female breast: Secondary | ICD-10-CM | POA: Diagnosis not present

## 2021-06-25 DIAGNOSIS — C50411 Malignant neoplasm of upper-outer quadrant of right female breast: Secondary | ICD-10-CM | POA: Diagnosis not present

## 2021-06-25 DIAGNOSIS — Z79899 Other long term (current) drug therapy: Secondary | ICD-10-CM | POA: Diagnosis not present

## 2021-06-25 DIAGNOSIS — Z5112 Encounter for antineoplastic immunotherapy: Secondary | ICD-10-CM | POA: Diagnosis not present

## 2021-06-25 DIAGNOSIS — R042 Hemoptysis: Secondary | ICD-10-CM | POA: Diagnosis not present

## 2021-06-25 DIAGNOSIS — K858 Other acute pancreatitis without necrosis or infection: Secondary | ICD-10-CM

## 2021-06-25 DIAGNOSIS — R059 Cough, unspecified: Secondary | ICD-10-CM | POA: Insufficient documentation

## 2021-06-25 DIAGNOSIS — D631 Anemia in chronic kidney disease: Secondary | ICD-10-CM | POA: Diagnosis not present

## 2021-06-25 DIAGNOSIS — N184 Chronic kidney disease, stage 4 (severe): Secondary | ICD-10-CM | POA: Diagnosis not present

## 2021-06-25 MED ORDER — TRAMADOL HCL 50 MG PO TABS: 50.0000 mg | ORAL_TABLET | Freq: Four times a day (QID) | ORAL | 0 refills | Status: AC | PRN

## 2021-06-28 ENCOUNTER — Encounter (HOSPITAL_COMMUNITY): Payer: BC Managed Care – PPO

## 2021-06-29 NOTE — Progress Notes (Signed)
[place]  [phone] [phone]    ID: [name] [name] DOB: 1971-11-22  MR#: [id number]  EXB#:[id number]  Patient Care Team: Marda Stalker, PA-C as PCP - General (Family Medicine) Jerline Pain, MD as PCP - Cardiology (Cardiology) Annora Guderian, Virgie Dad, MD as Consulting Physician (Oncology) Loreta Ave, MD as Referring Physician (Internal Medicine) Donato Heinz, MD as Consulting Physician (Nephrology) Kyung Rudd, MD as Consulting Physician (Radiation Oncology) Jovita Kussmaul, MD as Consulting Physician (General Surgery) Larey Dresser, MD as Consulting Physician (Cardiology) Mauro Kaufmann, RN as Oncology Nurse Navigator Rockwell Germany, RN as Oncology Nurse Navigator OTHER MD:   CHIEF COMPLAINT: 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   She was scheduled for restaging PET scan on 06/28/2021, but this was cancelled due to no insurance pre-authorization.  A repeat echo also has been ordered (2 orders in chart) but not yet scheduled  She continues on anastrozole.  She has no unusual side effects from this other than mild vaginal dryness which also of course is due to menopause  She was switched to Zometa at slightly reduced dose on 05/09/2021. This is being repeated every 12 weeks  She received trastuzumab 06/11/2021.  The T-DM1 was held because of intercurrent pancreatitis.  Review of the literature finds 1 case report of possible TDM 1 induced pancreatitis--in short there is no clear association between the 2.  We are accordingly returning to T-DM1 today pending results of PET, to be scheduled  Finally she received Retacrit 06/11/2021.  This is written to be repeated every 3 weeks.  Her hemoglobin today is marginally  improved.  REVIEW OF SYSTEMS: [name] currently has no symptoms of pancreatitis.  She is eating normally including peanut butter and eggs.  In fact she worries that she has not lost any weight.  She is walking most days at least a mile a day.  She no longer is is coughing.  She says the inhaler really was helpful.  She never got the gabapentin prescription she says.  Overall a detailed review of systems today was improved.   COVID 19 VACCINATION STATUS: [place] x2, most recently 03/2020; infection 04/2021   HISTORY OF CURRENT ILLNESS: From the original intake note:  [name] Bjelland palpated a lump on 01/15/2018. She felt soreness and shooting pain under her arm and in the right breast. She underwent bilateral diagnostic mammography with tomography and right breast ultrasonography at [facility] on 03/12/2018 showing: breast density category C. The is architectural distortion at the 11 o'clock position. There is also an oval mass in the right breast anterior depth. Examination of the right axilla showed enlarged lymph nodes. Ultrasonography showed a 4.6 cm mass in the right breast upper outer quadrant posterior depth. An additional 1.2 cm oval mass in the right breast 12 o'clock middle depth. The lymph nodes in the right axillary are highly suggestive of malignancy.   Accordingly on 03/16/2018 she proceeded to biopsy of the right breast area in question. The pathology from this procedure showed ([id number]): At both the 11 and 12 o'clock positions: Invasive ductal carcinoma grade II. Ductal carcinoma in situ, high grade, with necrosis and calcifications. Prognostic indicators significant for: estrogen receptor, 90% positive and progesterone receptor, 40% positive, both with strong staining intensity. Proliferation marker Ki67 at 80%. HER2 amplified with ratios HER2/CEP17 SIGNALS 6.90  and average HER2 copies per cell 14.50  The patient's subsequent history is as detailed below.   PAST MEDICAL HISTORY: Past  Medical History:  Diagnosis Date   Anxiety    Breast cancer, left breast ([place]) 2000   Underwent lumpectomy, chemotherapy and radiation   Facial paralysis/Bells palsy    left side   Family history of lung cancer    Family history of lymphoma    Hypertension    Neuropathy    IN FINGERS AND TOES   RECENT INFUSION OF CHEMO   Personal history of breast cancer 05/06/2018   Renal disorder    just after TIA acute kidney injury   TIA (transient ischemic attack) 03/16/2017   [facility]  The patient has a previous history of left breast cancer diagnosed in 2000. She was seen by [facility] in [place], [place] under Dr. Loreta Ave. According to the patient, her left breast cancer was stage II with no lymph node involvement (likely T2N0). She had chemotherapy every 3 weeks for about 6 months. She did not takes any "red drugs." She also did not take anti-estrogens (likely ER/PR negative).   TIA in 2018. She saw a neurologist as a precaution. She denies any clotting issues.    PAST SURGICAL HISTORY: Past Surgical History:  Procedure Laterality Date   BREAST LUMPECTOMY WITH AXILLARY LYMPH NODE BIOPSY Left 2000   biopsy   BREAST SURGERY     partial mastectomy with lymph node removal   ENDOBRONCHIAL ULTRASOUND Bilateral 09/05/2020   Procedure: ENDOBRONCHIAL ULTRASOUND;  Surgeon: Collene Gobble, MD;  Location: [facility];  Service: Cardiopulmonary;  Laterality: Bilateral;   MASTECTOMY Right 2019   & partial mastectomy left breast 2000   MASTECTOMY MODIFIED RADICAL Right 08/26/2018   Procedure: MASTECTOMY MODIFIED RADICAL;  Surgeon: Jovita Kussmaul, MD;  Location: [facility];  Service: General;  Laterality: Right;   MODIFIED MASTECTOMY Right 08/26/2018   PORTACATH PLACEMENT Left 04/08/2018   Procedure: INSERTION PORT-A-CATH;  Surgeon: Jovita Kussmaul, MD;  Location: [facility];  Service: General;  Laterality: Left;   TUBAL LIGATION Bilateral 2004   VIDEO BRONCHOSCOPY N/A 09/05/2020    Procedure: VIDEO BRONCHOSCOPY WITHOUT FLUORO;  Surgeon: Collene Gobble, MD;  Location: [facility];  Service: Cardiopulmonary;  Laterality: N/A;   WISDOM TOOTH EXTRACTION      FAMILY HISTORY Family History  Problem Relation Age of Onset   Hypertension Mother    Hypertension Father    Diverticulosis Father    Diabetes Father    Lung cancer Father 67       hx smoking   Hypertension Maternal Grandmother    CAD Maternal Grandmother    Alzheimer's disease Maternal Grandmother        died at 57   Hypertension Paternal Grandmother    CAD Paternal Grandmother    Stroke Paternal Grandfather    Sickle cell trait Paternal Grandfather    Pneumonia Maternal Aunt        died at a young adult   Lymphoma Paternal Aunt 35  The patient's father is alive at age 79 and has a history of lung cancer. The patient's mother is alive at age 15. The patient has 1 brother and no sisters. She denies a family history of breast or ovarian cancer.     GYNECOLOGIC HISTORY:  Patient's last menstrual period was 12/21/2017. Menarche: 50 years old Age at first live birth: 50 years old She is GXP3.  Her LMP was  January 2019.  The patient used oral contraceptive from [phone] and the Depo-provera shot from [phone] with no complications. She never used HRT.  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  She also worked at [employer] as a Occupational psychologist.  She is now disabled.  Her husband, [name], is a Administrator. The patient's son, Shea Stakes age 52, works at [employer] in [place]. The patient's daughters, Lilia Pro age 12, and Levonne Spiller age 11, are students. Lilia Pro plans to attend [employer] in the fall. The patient's adopted son, Vonna Kotyk age 56, is also a Ship broker.    ADVANCED DIRECTIVES: In the absence of any documents to the contrary the patient's husband is her healthcare power of attorney   HEALTH  MAINTENANCE: Social History   Tobacco Use   Smoking status: Never   Smokeless tobacco: Never  Vaping Use   Vaping Use: Never used  Substance Use Topics   Alcohol use: No   Drug use: No     Colonoscopy: n/a  PAP: 2015  Bone density: never done   Allergies  Allergen Reactions   Ace Inhibitors Swelling    Swelling of lips and face   Lisinopril Swelling    Swelling of lips, face   Amlodipine Swelling    Leg swelling   Shellfish Allergy Swelling   Adhesive [Tape] Itching and Rash    Please use paper tape   Latex Itching and Rash    Current Outpatient Medications  Medication Sig Dispense Refill   acyclovir (ZOVIRAX) 400 MG tablet TAKE 1 TABLET BY MOUTH TWICE A DAY (Patient taking differently: Take 400 mg by mouth daily.) 180 tablet 1   albuterol (VENTOLIN HFA) 108 (90 Base) MCG/ACT inhaler Inhale 2 puffs into the lungs every 6 (six) hours as needed for wheezing or shortness of breath. 8 g 2   anastrozole (ARIMIDEX) 1 MG tablet Take 1 tablet (1 mg total) by mouth daily. 90 tablet 4   Ascorbic Acid (VITAMIN C) 1000 MG tablet Take 1,000 mg by mouth daily.     Chlorpheniramine-Pseudoeph (PSEUDEPHEDRINE PLUS PO) Take 1 tablet by mouth daily as needed (congestion).     cholecalciferol (VITAMIN D3) 25 MCG (1000 UNIT) tablet Take 1,000 Units by mouth daily.     Cyanocobalamin (VITAMIN B12) 500 MCG TABS Take 500 mcg by mouth daily.      ELDERBERRY PO Take 1 tablet by mouth daily.     ferrous sulfate 325 (65 FE) MG tablet Take 1 tablet (325 mg total) by mouth daily. 30 tablet 0   lidocaine-prilocaine (EMLA) cream Apply 1 application topically as needed. (Patient taking differently: Apply 1 application topically daily as needed (port access).) 30 g 6   Multiple Vitamin (MULITIVITAMIN WITH MINERALS) TABS Take 1 tablet by mouth daily.     prochlorperazine (COMPAZINE) 10 MG tablet Take 1 tablet (10 mg total) by mouth every 6 (six) hours as needed (Nausea or vomiting). (Patient not taking: No  sig reported) 60 tablet 1   sodium bicarbonate 650 MG tablet Take 1 tablet (650 mg total) by mouth 2 (two) times daily. 60 tablet 1   traMADol (ULTRAM) 50 MG tablet Take 1-2 tablets (50-100 mg total) by mouth every 6 (six) hours as needed. 60 tablet 0   venlafaxine XR (EFFEXOR-XR) 37.5 MG 24 hr capsule TAKE 1 CAPSULE BY MOUTH DAILY WITH BREAKFAST. 90 capsule 1   No current facility-administered medications for this visit.  OBJECTIVE:  African-American woman who appears stated age  73:   07/02/21 0847  BP: 110/60  Pulse: 67  Resp: 18  Temp: (!) 97.3 F (36.3 C)  SpO2: 99%   Wt Readings from Last 3 Encounters:  07/02/21 172 lb 14.4 oz (78.4 kg)  06/25/21 172 lb 12.8 oz (78.4 kg)  06/11/21 177 lb 7 oz (80.5 kg)   Body mass index is 34.92 kg/m.    ECOG FS:1 - Symptomatic but completely ambulatory  Sclerae unicteric, EOMs intact Wearing a mask No cervical or supraclavicular adenopathy Lungs no rales or rhonchi Heart regular rate and rhythm Abd soft, nontender, positive bowel sounds MSK no focal spinal tenderness, no upper extremity lymphedema Neuro: nonfocal, well oriented, appropriate affect Breasts: The right breast is status post mastectomy.  There is no evidence of chest wall recurrence.  The left breast is benign.  Both axillae are benign.   LAB RESULTS:  CMP     Component Value Date/Time   NA 140 06/11/2021 0803   K 4.3 06/11/2021 0803   CL 112 (H) 06/11/2021 0803   CO2 19 (L) 06/11/2021 0803   GLUCOSE 126 (H) 06/11/2021 0803   BUN 37 (H) 06/11/2021 0803   CREATININE 1.56 (H) 06/11/2021 0803   CREATININE 2.77 (H) 05/30/2021 0917   CALCIUM 10.4 (H) 06/11/2021 0803   CALCIUM 11.2 (H) 02/24/2020 0830   PROT 7.4 06/11/2021 0803   ALBUMIN 2.5 (L) 06/11/2021 0803   AST 115 (H) 06/11/2021 0803   AST 96 (H) 05/30/2021 0917   ALT 65 (H) 06/11/2021 0803   ALT 56 (H) 05/30/2021 0917   ALKPHOS 161 (H) 06/11/2021 0803   BILITOT 1.0 06/11/2021 0803   BILITOT  1.8 (H) 05/30/2021 0917   GFRNONAA 41 (L) 06/11/2021 0803   GFRNONAA 20 (L) 05/30/2021 0917   GFRAA 41 (L) 08/30/2020 0808   GFRAA 45 (L) 07/31/2020 1400    Lab Results  Component Value Date   TOTALPROTELP 7.0 02/25/2019     Lab Results  Component Value Date   KPAFRELGTCHN 76.7 (H) 02/25/2019   LAMBDASER 27.2 (H) 02/25/2019   KAPLAMBRATIO 2.82 (H) 02/25/2019    Lab Results  Component Value Date   WBC 4.5 07/02/2021   NEUTROABS 3.1 07/02/2021   HGB 8.8 (L) 07/02/2021   HCT 26.6 (L) 07/02/2021   MCV 91.7 07/02/2021   PLT 72 (L) 07/02/2021   No results found for: LABCA2  No components found for: KKXFGH829  No results for input(s): INR in the last 168 hours.   No results found for: LABCA2  Lab Results  Component Value Date   CAN199 90 (H) 06/07/2021    No results found for: HBZ169  No results found for: CVE938  Lab Results  Component Value Date   CA2729 63.5 (H) 06/07/2021    No components found for: HGQUANT  No results found for: CEA1 / No results found for: CEA1   No results found for: AFPTUMOR  No results found for: CHROMOGRNA  No results found for: HGBA, HGBA2QUANT, HGBFQUANT, HGBSQUAN (Hemoglobinopathy evaluation)   No results found for: LDH  No results found for: IRON, TIBC, IRONPCTSAT (Iron and TIBC)  Lab Results  Component Value Date   FERRITIN 98 12/27/2020    Urinalysis    Component Value Date/Time   COLORURINE YELLOW 05/30/2021 1153   APPEARANCEUR CLEAR 05/30/2021 1153   LABSPEC 1.010 05/30/2021 1153   PHURINE 5.0 05/30/2021 1153   GLUCOSEU NEGATIVE 05/30/2021 1153   HGBUR SMALL (A)  05/30/2021 1153   BILIRUBINUR NEGATIVE 05/30/2021 1153   KETONESUR NEGATIVE 05/30/2021 1153   PROTEINUR 30 (A) 05/30/2021 1153   UROBILINOGEN 0.2 11/29/2011 1656   NITRITE NEGATIVE 05/30/2021 1153   LEUKOCYTESUR NEGATIVE 05/30/2021 1153    STUDIES: DG Chest 2 View  Result Date: 06/25/2021 CLINICAL DATA:  Cough.  History stage IV breast  cancer.  Hemoptysis. EXAM: CHEST - 2 VIEW COMPARISON:  06/04/2021 FINDINGS: Left chest wall port a catheter is noted with tip in the projection of the SVC surgical clips are noted within bilateral axilla and right hilar region. Unchanged interstitial prominence within the right upper lobe which is favored to represent postinflammatory change. No pleural effusion or edema. No superimposed airspace consolidation. Visualized osseous structures are unremarkable IMPRESSION: No acute cardiopulmonary abnormalities. Electronically Signed   By: Kerby Moors M.D.   On: 06/25/2021 12:32   DG CHEST PORT 1 VIEW  Result Date: 06/04/2021 CLINICAL DATA:  Pneumonia.  Metastatic breast cancer. EXAM: PORTABLE CHEST 1 VIEW COMPARISON:  05/30/2021 chest x-ray and 06/01/2021 chest CT FINDINGS: Patient has LEFT-sided power port, tip overlying the superior vena cava. The heart size is normal. Persistent minimal opacity in the MEDIAL RIGHT UPPER lobe and RIGHT infrahilar region. LEFT lung is clear. Prior RIGHT mastectomy and axillary node dissection. IMPRESSION: Persistent hazy density in the MEDIAL RIGHT UPPER lobe and asymmetry in the RIGHT UPPER hilar region. No new findings. Electronically Signed   By: Nolon Nations M.D.   On: 06/04/2021 11:58      ELIGIBLE FOR AVAILABLE RESEARCH PROTOCOL: no   ASSESSMENT: 50 y.o. [place], [place] woman  (1) status post left lumpectomy in 2000 for a (?) T2N0 breast cancer,  (a) status post adjuvant chemotherapy  (b) status post adjuvant radiation  (c) did not receive antiestrogens  (2) genetics testing May 18, 2018 through the common Hereditary Cancers Panel + Myelodysplastic Syndrome/Leukemia Panel found no deleterious mutations in APC, ATM, AXIN2, BARD1, BLM, BMPR1A, BRCA1, BRCA2, BRIP1, CDH1, CDK4, CDKN2A (p14ARF), CDKN2A (p16INK4a), CEBPA, CHEK2, CTNNA1, DICER1, EPCAM*, GATA2, GREM1*, HRAS, KIT, MEN1, MLH1, MSH2, MSH3, MSH6, MUTYH, NBN, NF1, PALB2, PDGFRA, PMS2, POLD1, POLE, PTEN,  RAD50, RAD51C, RAD51D, RUNX1, SDHB, SDHC, SDHD, SMAD4, SMARCA4, STK11, TERC, TERT, TP53, TSC1, TSC2, VHL The following genes were evaluated for sequence changes only: HOXB13*, NTHL1*, SDHA  (a)  3 variants of uncertain significance were identified in the genes BARD1 c.764A>G (p.Asn255Ser), BRIP1 c.2563C>T (p.Arg855Cys), and PALB2 c.3103A>T (p.Ile1035Phe).   (3) status post right breast biopsy 03/16/2018 for a clinical mT2-3 N2, anatomic stage III invasive ductal carcinoma, grade 2, triple positive, with an MIB-1 of 80%.  (a) bone scan and chest CT scan negative for metastases except for a T6 lytic lesion of concern  (b) thoracic spine MRI was ordered May 2019 but never performed.  (4) neoadjuvant chemotherapy consisting of carboplatin, docetaxel, trastuzumab and Pertuzumab every 3 weeks x 6 starting 04/16/2018  (a) pertuzumab held beginning with cycle 2 because of diarrhea  (5) trastuzumab was to be continued Q4w indefinitely, discontinued after 10/21/2019 dose with progression  (a) echocardiogram on 06/29/2018 showed an ejection fraction in the 65-70% range  (b) echocardiogram 11/19/2018 showed an ejection fraction in the 60-65% range  (c) echo 04/07/2019 shows an ejection fraction in the 55-60% range  (d) echo 07/15/2019 shows EF of 60-65%--for additional echoes see under #12 below  (6) right mastectomy and sentinel lymph node sampling on 08/26/2018 found a residual 2.5cm invasive ductal carcinoma, with 2 of 5 sentinel lymph nodes  positive, ypT2, ypN1a; margins were clear  (a) repeat prognostic panel confirms tumor still triple positive (HER2 3+)  (7) adjuvant radiation 09/30/2018 - 11/16/2018  Site/dose:   The patient initially received a dose of 50.4 Gy in 28 fractions to the right chest wall and supraclavicular region. This was delivered using a 3-D conformal, 4 field technique. The patient then received a boost to the mastectomy scar. This delivered an additional 10 Gy in 5 fractions using  an en face electron field. The total dose was 60.4   (8) anastrozole started 12/03/2018  (a) [place] on 10/01/2018 was 64.3, and estradiol 7.0, consistent with menopause  (b) referral to pelvic floor rehabilitation 12/03/2018  (c) [place] on 03/16/2020 was 80.8 with estradiol less than 2.5   METASTATIC DISEASE: March 2020 (9) CT of neck and CT angiography of the chest 02/04/2019 finds the T6 lytic lesion noted May 2019 (see #3 above) is now sclerotic; a sclerotic T3 lesion is stable; there is a new lytic lesion in the manubrium  (a) PET scan 03/12/2019 shows manubrium, T3 and T5 metastases, no visceral disease  (b) biopsy of manubrial lesion planned but not done secondary to pandemic  (c) Bone scan 09/01/19 shows ? progression of disease in spine; MRI 09/02/19 shows early metastases in T10 (new), T3 and T5 stable; CT chest 09/01/19 shows 26mm pulmonary nodules that are new, but nonspecific and warrant future follow up  (d) PET 10/26/2019 shows no lung or liver lesions; new mediastinal adenopathy, bone lesions-- T-DM1 started  (e) PET scan 03/08/2020 shows resolution of the mediastinal adenopathy and hypermetabolic bone lesions, questionable subcutaneous nodule along the left lateral shoulder  (f) for additional staging studies see under "12" below  (10) SRS to spine, palliative treatment to sternum, from 05/13/2019 through 05/26/2019:  1. Chest_sternum // 40 Gy in 10 fractions  2. Thoracic Spine (T3) // 18 Gy in 1 fraction   3. Thoracic Spine (T5) // 18 Gy in 1 fraction   (11) denosumab/Xgeva started 09/08/2019, currently given every 6 weeks  (12) T-DM1 started 01/15/20201  (a) echo 10/26/2019 shows stable EF at 55-60%  (b) echo 03/08/2020 shows an ejection fraction in the 70-75% range  (c) echo 07/12/2020 shows an ejection fraction in the 65 to 70% range.  (d) PET scan 03/08/2020 shows no active disease; a 0.9 cm subcutaneous nodule in the left shoulder area seen on the PET was not identifiable by exam  03/16/2020  (e) PET 08/24/2020 shows stable disease except for a new 0.6 cm lymph node with an SUV of 10.1  (f) bronchoscopy 09/05/2020 with endobronchial ultrasound found no detectable mass in the region noted on the September PET scan  (g) T-DM1 discontinued and trastuzumab resumed 11/29/2020  (h) PET scan 02/26/2021 shows mild nodular increase: TDM-1 resumed 03/07/2021, discontinued after 05/09/2021 dose pending restaging studies  (i) noncontrast chest CT 06/01/2021 shows possibly mildly enlarged adenopathy, and an apparently new sclerotic lesion at T8.  There was also evidence of pancreatitis and possible pneumonitis   PLAN: [name] had an episode of pancreatitis which clinically has resolved.  We have a repeat lipase pending today.  We have not been able to get her PET scan to accurately restage her but I was just told that it has been approved so hopefully within the next week or so we will get that done.  That will allow [place] to tell whether we need to change from T-DM1 to [facility] or continue the treatment as we have been doing.  I commended her exercise program and her diet and suggested she cut back on the rice bread on [place] that she eats during the week.  We will try to get her echo scheduled between now and her next treatment here.  Total encounter time 25 minutes.Sarajane Jews C. Jujhar Everett, MD 07/02/21 9:06 AM Medical Oncology and Hematology [facility] [place], [place] [address] Tel. [phone]    Fax. [phone]   I, Wilburn Mylar, am acting as scribe for Dr. Virgie Dad. Any Mcneice.  I, Lurline Del MD, have reviewed the above documentation for accuracy and completeness, and I agree with the above.   *Total Encounter Time as defined by the Centers for Medicare and Medicaid Services includes, in addition to the face-to-face time of a patient visit (documented in the note above) non-face-to-face time: obtaining and reviewing outside history,  ordering and reviewing medications, tests or procedures, care coordination (communications with other health care professionals or caregivers) and documentation in the medical record.

## 2021-07-02 ENCOUNTER — Other Ambulatory Visit: Payer: Self-pay

## 2021-07-02 ENCOUNTER — Inpatient Hospital Stay (HOSPITAL_BASED_OUTPATIENT_CLINIC_OR_DEPARTMENT_OTHER): Payer: BC Managed Care – PPO | Admitting: Oncology

## 2021-07-02 ENCOUNTER — Inpatient Hospital Stay: Payer: BC Managed Care – PPO

## 2021-07-02 ENCOUNTER — Inpatient Hospital Stay: Payer: BC Managed Care – PPO | Attending: Oncology

## 2021-07-02 VITALS — BP 110/60 | HR 67 | Temp 97.3°F | Resp 18 | Wt 172.9 lb

## 2021-07-02 VITALS — BP 101/57 | HR 68 | Temp 98.5°F

## 2021-07-02 DIAGNOSIS — Z79899 Other long term (current) drug therapy: Secondary | ICD-10-CM | POA: Insufficient documentation

## 2021-07-02 DIAGNOSIS — D631 Anemia in chronic kidney disease: Secondary | ICD-10-CM | POA: Diagnosis not present

## 2021-07-02 DIAGNOSIS — C50411 Malignant neoplasm of upper-outer quadrant of right female breast: Secondary | ICD-10-CM | POA: Insufficient documentation

## 2021-07-02 DIAGNOSIS — Z79811 Long term (current) use of aromatase inhibitors: Secondary | ICD-10-CM | POA: Diagnosis not present

## 2021-07-02 DIAGNOSIS — C7951 Secondary malignant neoplasm of bone: Secondary | ICD-10-CM | POA: Insufficient documentation

## 2021-07-02 DIAGNOSIS — Z95828 Presence of other vascular implants and grafts: Secondary | ICD-10-CM

## 2021-07-02 DIAGNOSIS — N184 Chronic kidney disease, stage 4 (severe): Secondary | ICD-10-CM | POA: Insufficient documentation

## 2021-07-02 DIAGNOSIS — Z170 Estrogen receptor positive status [ER+]: Secondary | ICD-10-CM

## 2021-07-02 DIAGNOSIS — K858 Other acute pancreatitis without necrosis or infection: Secondary | ICD-10-CM

## 2021-07-02 DIAGNOSIS — Z5112 Encounter for antineoplastic immunotherapy: Secondary | ICD-10-CM | POA: Diagnosis not present

## 2021-07-02 DIAGNOSIS — Z7189 Other specified counseling: Secondary | ICD-10-CM

## 2021-07-02 DIAGNOSIS — Z5111 Encounter for antineoplastic chemotherapy: Secondary | ICD-10-CM | POA: Insufficient documentation

## 2021-07-02 LAB — CBC WITH DIFFERENTIAL/PLATELET
Abs Immature Granulocytes: 0.02 10*3/uL (ref 0.00–0.07)
Basophils Absolute: 0 10*3/uL (ref 0.0–0.1)
Basophils Relative: 1 %
Eosinophils Absolute: 0.1 10*3/uL (ref 0.0–0.5)
Eosinophils Relative: 3 %
HCT: 26.6 % — ABNORMAL LOW (ref 36.0–46.0)
Hemoglobin: 8.8 g/dL — ABNORMAL LOW (ref 12.0–15.0)
Immature Granulocytes: 0 %
Lymphocytes Relative: 18 %
Lymphs Abs: 0.8 10*3/uL (ref 0.7–4.0)
MCH: 30.3 pg (ref 26.0–34.0)
MCHC: 33.1 g/dL (ref 30.0–36.0)
MCV: 91.7 fL (ref 80.0–100.0)
Monocytes Absolute: 0.5 10*3/uL (ref 0.1–1.0)
Monocytes Relative: 10 %
Neutro Abs: 3.1 10*3/uL (ref 1.7–7.7)
Neutrophils Relative %: 68 %
Platelets: 72 10*3/uL — ABNORMAL LOW (ref 150–400)
RBC: 2.9 MIL/uL — ABNORMAL LOW (ref 3.87–5.11)
RDW: 17.2 % — ABNORMAL HIGH (ref 11.5–15.5)
WBC: 4.5 10*3/uL (ref 4.0–10.5)
nRBC: 0 % (ref 0.0–0.2)

## 2021-07-02 LAB — COMPREHENSIVE METABOLIC PANEL
ALT: 84 U/L — ABNORMAL HIGH (ref 0–44)
AST: 132 U/L — ABNORMAL HIGH (ref 15–41)
Albumin: 2.8 g/dL — ABNORMAL LOW (ref 3.5–5.0)
Alkaline Phosphatase: 143 U/L — ABNORMAL HIGH (ref 38–126)
Anion gap: 8 (ref 5–15)
BUN: 45 mg/dL — ABNORMAL HIGH (ref 6–20)
CO2: 20 mmol/L — ABNORMAL LOW (ref 22–32)
Calcium: 10.4 mg/dL — ABNORMAL HIGH (ref 8.9–10.3)
Chloride: 109 mmol/L (ref 98–111)
Creatinine, Ser: 1.85 mg/dL — ABNORMAL HIGH (ref 0.44–1.00)
GFR, Estimated: 33 mL/min — ABNORMAL LOW (ref >60)
Glucose, Bld: 134 mg/dL — ABNORMAL HIGH (ref 70–99)
Potassium: 3.8 mmol/L (ref 3.5–5.1)
Sodium: 137 mmol/L (ref 135–145)
Total Bilirubin: 0.7 mg/dL (ref 0.3–1.2)
Total Protein: 7.2 g/dL (ref 6.5–8.1)

## 2021-07-02 LAB — LIPASE, BLOOD: Lipase: 147 U/L — ABNORMAL HIGH (ref 11–51)

## 2021-07-02 LAB — RETICULOCYTES
Immature Retic Fract: 23.3 % — ABNORMAL HIGH (ref 2.3–15.9)
RBC.: 2.88 MIL/uL — ABNORMAL LOW (ref 3.87–5.11)
Retic Count, Absolute: 61.1 10*3/uL (ref 19.0–186.0)
Retic Ct Pct: 2.1 % (ref 0.4–3.1)

## 2021-07-02 LAB — AMYLASE: Amylase: 125 U/L — ABNORMAL HIGH (ref 28–100)

## 2021-07-02 MED ORDER — DIPHENHYDRAMINE HCL 25 MG PO CAPS
ORAL_CAPSULE | ORAL | Status: AC
  Filled 2021-07-02: qty 1

## 2021-07-02 MED ORDER — DIPHENHYDRAMINE HCL 25 MG PO CAPS
25.0000 mg | ORAL_CAPSULE | Freq: Once | ORAL | Status: AC
  Administered 2021-07-02: 25 mg via ORAL

## 2021-07-02 MED ORDER — SODIUM CHLORIDE 0.9 % IV SOLN
Freq: Once | INTRAVENOUS | Status: AC
  Filled 2021-07-02: qty 250

## 2021-07-02 MED ORDER — SODIUM CHLORIDE 0.9 % IV SOLN
3.6000 mg/kg | Freq: Once | INTRAVENOUS | Status: AC
  Administered 2021-07-02: 300 mg via INTRAVENOUS
  Filled 2021-07-02: qty 15

## 2021-07-02 MED ORDER — ACETAMINOPHEN 325 MG PO TABS
650.0000 mg | ORAL_TABLET | Freq: Once | ORAL | Status: AC
  Administered 2021-07-02: 650 mg via ORAL

## 2021-07-02 MED ORDER — SODIUM CHLORIDE 0.9% FLUSH
10.0000 mL | INTRAVENOUS | Status: DC | PRN
Start: 2021-07-02 — End: 2021-07-02
  Administered 2021-07-02: 10 mL
  Filled 2021-07-02: qty 10

## 2021-07-02 MED ORDER — HEPARIN SOD (PORK) LOCK FLUSH 100 UNIT/ML IV SOLN
500.0000 [IU] | Freq: Once | INTRAVENOUS | Status: AC | PRN
  Administered 2021-07-02: 500 [IU]
  Filled 2021-07-02: qty 5

## 2021-07-02 MED ORDER — EPOETIN ALFA-EPBX 40000 UNIT/ML IJ SOLN
INTRAMUSCULAR | Status: AC
  Filled 2021-07-02: qty 1

## 2021-07-02 MED ORDER — SODIUM CHLORIDE 0.9% FLUSH
10.0000 mL | INTRAVENOUS | Status: AC | PRN
  Administered 2021-07-02: 10 mL
  Filled 2021-07-02: qty 10

## 2021-07-02 MED ORDER — EPOETIN ALFA-EPBX 40000 UNIT/ML IJ SOLN
40000.0000 [IU] | Freq: Once | INTRAMUSCULAR | Status: AC
  Administered 2021-07-02: 40000 [IU] via SUBCUTANEOUS

## 2021-07-02 MED ORDER — ACETAMINOPHEN 325 MG PO TABS
ORAL_TABLET | ORAL | Status: AC
  Filled 2021-07-02: qty 2

## 2021-07-02 NOTE — Progress Notes (Signed)
Per Lauren with Dr. Jana Hakim ok to treat with PLT 72; creatinine 1.85 and elevated liver enzymes.  Last echo 02/13/21; ok to treat.

## 2021-07-02 NOTE — Patient Instructions (Signed)
[facility]  Discharge Instructions: °Thank you for choosing [facility] to provide your oncology and hematology care.  ° °If you have a lab appointment with the Cancer Center, please go directly to the Cancer Center and check in at the registration area. °  °Wear comfortable clothing and clothing appropriate for easy access to any Portacath or PICC line.  ° °We strive to give you quality time with your provider. You may need to reschedule your appointment if you arrive late (15 or more minutes).  Arriving late affects you and other patients whose appointments are after yours.  Also, if you miss three or more appointments without notifying the office, you may be dismissed from the clinic at the provider’s discretion.    °  °For prescription refill requests, have your pharmacy contact our office and allow 72 hours for refills to be completed.   ° °Today you received the following chemotherapy and/or immunotherapy agents [place]cy[place]    °  °To help prevent nausea and vomiting after your treatment, we encourage you to take your nausea medication as directed. ° °BELOW ARE SYMPTOMS THAT SHOULD BE REPORTED IMMEDIATELY: °*FEVER GREATER THAN 100.4 F (38 °C) OR HIGHER °*CHILLS OR SWEATING °*NAUSEA AND VOMITING THAT IS NOT CONTROLLED WITH YOUR NAUSEA MEDICATION °*UNUSUAL SHORTNESS OF BREATH °*UNUSUAL BRUISING OR BLEEDING °*URINARY PROBLEMS (pain or burning when urinating, or frequent urination) °*BOWEL PROBLEMS (unusual diarrhea, constipation, pain near the anus) °TENDERNESS IN MOUTH AND THROAT WITH OR WITHOUT PRESENCE OF ULCERS (sore throat, sores in mouth, or a toothache) °UNUSUAL RASH, SWELLING OR PAIN  °UNUSUAL VAGINAL DISCHARGE OR ITCHING  ° °Items with * indicate a potential emergency and should be followed up as soon as possible or go to the Emergency Department if any problems should occur. ° °Please show the CHEMOTHERAPY ALERT CARD or IMMUNOTHERAPY ALERT CARD at check-in to the  Emergency Department and triage nurse. ° °Should you have questions after your visit or need to cancel or reschedule your appointment, please contact [facility]  Dept: [phone]  and follow the prompts.  Office hours are 8:00 a.m. to 4:30 p.m. Monday - Friday. Please note that voicemails left after 4:00 p.m. may not be returned until the following business day.  We are closed weekends and major holidays. You have access to a nurse at all times for urgent questions. Please call the main number to the clinic Dept: [phone] and follow the prompts. ° ° °For any non-urgent questions, you may also contact your provider using MyChart. We now offer e-Visits for anyone 18 and older to request care online for non-urgent symptoms. For details visit [email or web address]. °  °Also download the MyChart app! Go to the app store, search "MyChart", open the app, select [facility], and log in with your MyChart username and password. ° °Due to Covid, a mask is required upon entering the hospital/clinic. If you do not have a mask, one will be given to you upon arrival. For doctor visits, patients may have 1 support person aged 18 or older with them. For treatment visits, patients cannot have anyone with them due to current Covid guidelines and our immunocompromised population.  ° °

## 2021-07-02 NOTE — Progress Notes (Signed)
Per MD, OK to treat with echo from March, as well as platelet count today, and CMP results. Infusion RN aware.

## 2021-07-03 ENCOUNTER — Telehealth: Payer: Self-pay | Admitting: Oncology

## 2021-07-03 NOTE — Telephone Encounter (Signed)
Scheduled appointment per 08/01 los. Patient is aware. 

## 2021-07-17 ENCOUNTER — Other Ambulatory Visit: Payer: Self-pay

## 2021-07-17 ENCOUNTER — Encounter (HOSPITAL_COMMUNITY)
Admission: RE | Admit: 2021-07-17 | Discharge: 2021-07-17 | Disposition: A | Discharge status: Home / Self Care | Payer: BC Managed Care – PPO | Source: Ambulatory Visit | Attending: Oncology | Admitting: Oncology

## 2021-07-17 DIAGNOSIS — R590 Localized enlarged lymph nodes: Secondary | ICD-10-CM | POA: Diagnosis not present

## 2021-07-17 DIAGNOSIS — K8689 Other specified diseases of pancreas: Secondary | ICD-10-CM | POA: Insufficient documentation

## 2021-07-17 DIAGNOSIS — C50411 Malignant neoplasm of upper-outer quadrant of right female breast: Secondary | ICD-10-CM | POA: Diagnosis not present

## 2021-07-17 DIAGNOSIS — C7951 Secondary malignant neoplasm of bone: Secondary | ICD-10-CM | POA: Insufficient documentation

## 2021-07-17 DIAGNOSIS — Z170 Estrogen receptor positive status [ER+]: Secondary | ICD-10-CM | POA: Diagnosis not present

## 2021-07-17 DIAGNOSIS — C50919 Malignant neoplasm of unspecified site of unspecified female breast: Secondary | ICD-10-CM | POA: Diagnosis not present

## 2021-07-17 LAB — GLUCOSE, CAPILLARY: Glucose-Capillary: 88 mg/dL (ref 70–99)

## 2021-07-17 MED ORDER — FLUDEOXYGLUCOSE F - 18 (FDG) INJECTION
8.6000 | Freq: Once | INTRAVENOUS | Status: AC | PRN
  Administered 2021-07-17: 8.42 via INTRAVENOUS

## 2021-07-18 ENCOUNTER — Other Ambulatory Visit: Payer: Self-pay | Admitting: *Deleted

## 2021-07-19 ENCOUNTER — Other Ambulatory Visit: Payer: Self-pay | Admitting: Oncology

## 2021-07-19 ENCOUNTER — Ambulatory Visit (HOSPITAL_BASED_OUTPATIENT_CLINIC_OR_DEPARTMENT_OTHER)
Admission: RE | Admit: 2021-07-19 | Discharge: 2021-07-19 | Disposition: A | Discharge status: Home / Self Care | Payer: BC Managed Care – PPO | Source: Ambulatory Visit | Attending: Internal Medicine | Admitting: Internal Medicine

## 2021-07-19 ENCOUNTER — Ambulatory Visit (HOSPITAL_COMMUNITY)
Admission: RE | Admit: 2021-07-19 | Discharge: 2021-07-19 | Disposition: A | Discharge status: Home / Self Care | Payer: BC Managed Care – PPO | Source: Ambulatory Visit | Attending: Family Medicine | Admitting: Family Medicine

## 2021-07-19 ENCOUNTER — Other Ambulatory Visit: Payer: Self-pay

## 2021-07-19 VITALS — BP 126/82 | Wt 170.2 lb

## 2021-07-19 DIAGNOSIS — Z170 Estrogen receptor positive status [ER+]: Secondary | ICD-10-CM

## 2021-07-19 DIAGNOSIS — Z79899 Other long term (current) drug therapy: Secondary | ICD-10-CM | POA: Insufficient documentation

## 2021-07-19 DIAGNOSIS — Z888 Allergy status to other drugs, medicaments and biological substances status: Secondary | ICD-10-CM | POA: Diagnosis not present

## 2021-07-19 DIAGNOSIS — I700 Atherosclerosis of aorta: Secondary | ICD-10-CM | POA: Diagnosis not present

## 2021-07-19 DIAGNOSIS — Z0189 Encounter for other specified special examinations: Secondary | ICD-10-CM

## 2021-07-19 DIAGNOSIS — I10 Essential (primary) hypertension: Secondary | ICD-10-CM | POA: Diagnosis not present

## 2021-07-19 DIAGNOSIS — C50411 Malignant neoplasm of upper-outer quadrant of right female breast: Secondary | ICD-10-CM

## 2021-07-19 DIAGNOSIS — Z923 Personal history of irradiation: Secondary | ICD-10-CM | POA: Diagnosis not present

## 2021-07-19 DIAGNOSIS — Z9221 Personal history of antineoplastic chemotherapy: Secondary | ICD-10-CM | POA: Insufficient documentation

## 2021-07-19 DIAGNOSIS — I509 Heart failure, unspecified: Secondary | ICD-10-CM | POA: Insufficient documentation

## 2021-07-19 DIAGNOSIS — I110 Hypertensive heart disease with heart failure: Secondary | ICD-10-CM | POA: Diagnosis not present

## 2021-07-19 LAB — ECHOCARDIOGRAM COMPLETE
Area-P 1/2: 3.15 cm2
S' Lateral: 2.7 cm

## 2021-07-19 MED ORDER — PROCHLORPERAZINE MALEATE 10 MG PO TABS
10.0000 mg | ORAL_TABLET | Freq: Four times a day (QID) | ORAL | 1 refills | Status: AC | PRN
Start: 2021-07-19 — End: ?

## 2021-07-19 MED ORDER — LIDOCAINE-PRILOCAINE 2.5-2.5 % EX CREA
TOPICAL_CREAM | CUTANEOUS | 3 refills | Status: DC
Start: 2021-07-19 — End: 2021-08-20

## 2021-07-19 MED ORDER — DEXAMETHASONE 4 MG PO TABS: 8.0000 mg | ORAL_TABLET | Freq: Every day | ORAL | 1 refills | Status: AC

## 2021-07-19 NOTE — Progress Notes (Signed)
Cardio-Oncology TeleHealth Note  Due to national recommendations of social distancing due to [place] 19, Audio/video telehealth visit is felt to be most appropriate for this patient at this time.  See MyChart message from today for patient consent regarding telehealth for [facility]. The patient was identified personally using two identifiers.   Date:  07/19/2021   ID:  [name] [name], DOB 12-27-1970, MRN [id number]  Location: Home  Provider location: [facility] Type of Visit: Established patient  PCP:  Marda Stalker, PA-C  Cardiologist:  Candee Furbish, MD Primary HF: [name]  Chief Complaint: Heart Failure follow-up   History of Present Illness:  HPI:  [name] [name] is 50 y.o. female with h/o HTN, TIA, anxiety and left breast cancer (treated in 2000 at [facility]) who was diagnosed with triple-positive R breast cancer in 4/19. Referred by Dr. Jana Hakim for enrollment into the Cardio-Oncology program.   Cancer history/treatment plan:   (1) status post left lumpectomy in 2000 for a (?) T2N0 breast cancer,             (a) status post adjuvant chemotherapy including adriamycin             (b) status post adjuvant radiation             (c) did not receive antiestrogens   (2) status post right breast biopsy 03/16/2018 for a clinical mT2-3 N2, anatomic stage III invasive ductal carcinoma, grade 2, triple positive, with an MIB-1 of 80%.             (a) bone scan and chest CT scan negative for metastases except for a thoracic spine lytic lesion of concern   (3) neoadjuvant chemotherapy consisting of carboplatin, docetaxel, trastuzumab and Pertuzumab every 3 weeks x 6 starting 04/16/2018             (a) pertuzumab held beginning with cycle 2 because of diarrhea   (4) trastuzumab to be continued indefinitely   (5) 9/25 s/p surgical resection   (6) adjuvant radiation to the right breast completed    (7) antiestrogens to start at the completion of local  treatment     She presents via audio/video conferencing for a telehealth visit today.  Had PET scan yesterday and had mild disease progression. Now on Hercpetin only. Has mild cough as residual from [place] in 5/22. Now doing water aerobics 2x/week. Also walking in gym at [facility]. No CP or SOB.   Echo today 07/19/21 EF 60-65% GLS -19.2%   Echo 11/10/20 EF 60-65% GLS -23.2 Personally reviewed Echo 07/12/20: EF 65-70% GLS -23.7%  Echo 03/08/20 EF 65-70%  Echo 11/20 EF 60-65% GLS -18.7%  Echo 04/07/19 EF 60-65% GLS -17.7%  Echo 12/19 EF 60% GLS -23%  Echo 06/29/18 EF 65-70%  GLS -20.6% LS 9.5 cm/s  Echo 4/19 EF 60-65% mild LVH Grade II DD   [name] [name] denies symptoms worrisome for COVID 19.   Past Medical History:  Diagnosis Date   Anxiety    Breast cancer, left breast ([place]) 2000   Underwent lumpectomy, chemotherapy and radiation   Facial paralysis/Bells palsy    left side   Family history of lung cancer    Family history of lymphoma    Hypertension    Neuropathy    IN FINGERS AND TOES   RECENT INFUSION OF CHEMO   Personal history of breast cancer 05/06/2018   Renal disorder    just after TIA acute kidney injury  TIA (transient ischemic attack) 03/16/2017   [place] [facility]   Past Surgical History:  Procedure Laterality Date   BREAST LUMPECTOMY WITH AXILLARY LYMPH NODE BIOPSY Left 2000   biopsy   BREAST SURGERY     partial mastectomy with lymph node removal   ENDOBRONCHIAL ULTRASOUND Bilateral 09/05/2020   Procedure: ENDOBRONCHIAL ULTRASOUND;  Surgeon: Collene Gobble, MD;  Location: [facility];  Service: Cardiopulmonary;  Laterality: Bilateral;   MASTECTOMY Right 2019   & partial mastectomy left breast 2000   MASTECTOMY MODIFIED RADICAL Right 08/26/2018   Procedure: MASTECTOMY MODIFIED RADICAL;  Surgeon: Jovita Kussmaul, MD;  Location: [facility];  Service: General;  Laterality: Right;   MODIFIED MASTECTOMY Right 08/26/2018   PORTACATH PLACEMENT Left 04/08/2018    Procedure: INSERTION PORT-A-CATH;  Surgeon: Jovita Kussmaul, MD;  Location: [facility];  Service: General;  Laterality: Left;   TUBAL LIGATION Bilateral 2004   VIDEO BRONCHOSCOPY N/A 09/05/2020   Procedure: VIDEO BRONCHOSCOPY WITHOUT FLUORO;  Surgeon: Collene Gobble, MD;  Location: [facility];  Service: Cardiopulmonary;  Laterality: N/A;   WISDOM TOOTH EXTRACTION       Current Outpatient Medications  Medication Sig Dispense Refill   albuterol (VENTOLIN HFA) 108 (90 Base) MCG/ACT inhaler Inhale 2 puffs into the lungs every 6 (six) hours as needed for wheezing or shortness of breath. 8 g 2   anastrozole (ARIMIDEX) 1 MG tablet Take 1 tablet (1 mg total) by mouth daily. 90 tablet 4   Ascorbic Acid (VITAMIN C) 1000 MG tablet Take 1,000 mg by mouth daily.     carvedilol (COREG) 3.125 MG tablet Take 3.125 mg by mouth 2 (two) times daily with a meal.     cholecalciferol (VITAMIN D3) 25 MCG (1000 UNIT) tablet Take 1,000 Units by mouth daily.     Cyanocobalamin (VITAMIN B12) 500 MCG TABS Take 500 mcg by mouth daily.      dexamethasone (DECADRON) 4 MG tablet Take 2 tablets (8 mg total) by mouth daily. Start the day after chemotherapy for 2 days. 30 tablet 1   ferrous sulfate 325 (65 FE) MG tablet Take 1 tablet (325 mg total) by mouth daily. 30 tablet 0   guaiFENesin (ROBITUSSIN) 100 MG/5ML liquid Take 200 mg by mouth 3 (three) times daily as needed for cough.     lidocaine-prilocaine (EMLA) cream Apply to affected area once 30 g 3   losartan (COZAAR) 50 MG tablet Take 50 mg by mouth daily.     Multiple Vitamin (MULITIVITAMIN WITH MINERALS) TABS Take 1 tablet by mouth daily.     prochlorperazine (COMPAZINE) 10 MG tablet Take 1 tablet (10 mg total) by mouth every 6 (six) hours as needed (Nausea or vomiting). 30 tablet 1   spironolactone (ALDACTONE) 25 MG tablet Take 25 mg by mouth daily.     traMADol (ULTRAM) 50 MG tablet Take 1-2 tablets (50-100 mg total) by mouth every 6 (six) hours as needed. 60 tablet 0    venlafaxine XR (EFFEXOR-XR) 37.5 MG 24 hr capsule TAKE 1 CAPSULE BY MOUTH DAILY WITH BREAKFAST. 90 capsule 1   No current facility-administered medications for this encounter.    Allergies:   Ace inhibitors, Lisinopril, Amlodipine, Shellfish allergy, Adhesive [tape], and Latex   Social History:  The patient  reports that she has never smoked. She has never used smokeless tobacco. She reports that she does not drink alcohol and does not use drugs.   Family History:  The patient's family history includes Alzheimer's disease in her  maternal grandmother; CAD in her maternal grandmother and paternal grandmother; Diabetes in her father; Diverticulosis in her father; Hypertension in her father, maternal grandmother, mother, and paternal grandmother; Lung cancer (age of onset: 108) in her father; Lymphoma (age of onset: 74) in her paternal aunt; Pneumonia in her maternal aunt; Sickle cell trait in her paternal grandfather; Stroke in her paternal grandfather.   ROS:  Please see the history of present illness.   All other systems are personally reviewed and negative.   Exam:  (Video/Tele Health Call; Exam is subjective and or/visual.) General:  Speaks in full sentences. No resp difficulty. Lungs: Normal respiratory effort with conversation.  Abdomen: Non-distended per patient report Extremities: Pt denies edema. Neuro: Alert & oriented x 3.   Recent Labs: 06/05/2021: Magnesium 1.8 07/02/2021: ALT 84; BUN 45; Creatinine, Ser 1.85; Hemoglobin 8.8; Platelets 72; Potassium 3.8; Sodium 137  Personally reviewed   Wt Readings from Last 3 Encounters:  07/19/21 77.2 kg (170 lb 4 oz)  07/02/21 78.4 kg (172 lb 14.4 oz)  06/25/21 78.4 kg (172 lb 12.8 oz)      ASSESSMENT AND PLAN:  1. Right Breast Cancer diagnosed 4/19 stage IV - triple positive. Found to have Stage IV. Disease in 4/20. - switched to [place] on 1/21 - PET scan 9/21 with progressive disease - PET scan 8/22 with continued progression per  her report - Switched back to Herceptin due to SEs on 11/29/20 - Echo today 07/19/21 EF 60-65% GLS -19.3 Personally reviewed - Explained incidence of Herceptin cardiotoxicity and role of Cardio-oncology clinic at length. Echo images reviewed personally. All parameters stable.  - Continue echo surveillance q6 months   2. HTN - BPs well controlled SBP 120-130s on her home cuff  COVID screen The patient does not have any symptoms that suggest any further testing/ screening at this time.  Social distancing reinforced today.  Recommended follow-up:  As above  Relevant cardiac medications were reviewed at length with the patient today.   The patient does not have concerns regarding their medications at this time.   The following changes were made today:  As above  Today, I have spent 14 minutes with the patient with telehealth technology discussing the above issues .    Signed, Glori Bickers, MD  07/19/2021 3:31 PM  [facility] [address] [facility] [place] [address] [phone] (office) [phone] (fax)

## 2021-07-19 NOTE — Progress Notes (Signed)
DISCONTINUE ON PATHWAY REGIMEN - Breast     A cycle is every 21 days:     Pertuzumab      Pertuzumab      Trastuzumab      Trastuzumab      Carboplatin      Docetaxel   **Always confirm dose/schedule in your pharmacy ordering system**  REASON: Disease Progression PRIOR TREATMENT: [id number]: Docetaxel + Carboplatin + Trastuzumab + Pertuzumab (TCHP) q21 Days x 6 Cycles TREATMENT RESPONSE: Progressive Disease (PD)  START OFF PATHWAY REGIMEN - Breast   [id number]:Fam-trastuzumab deruxtecan-nxki 5.4 mg/kg IV D1 q21 Days:   A cycle is every 21 days:     Fam-trastuzumab deruxtecan-nxki   **Always confirm dose/schedule in your pharmacy ordering system**  Patient Characteristics: Distant Metastases or Locoregional Recurrent Disease - Unresected or Locally Advanced Unresectable Disease Progressing after Neoadjuvant and Local Therapies, HER2 Positive, ER Positive, HER2-Targeted Therapy (Concurrent with Endocrine Therapy) Therapeutic Status: Distant Metastases ER Status: Positive (+) HER2 Status: Positive (+) PR Status: Positive (+) Intent of Therapy: Non-Curative / Palliative Intent, Discussed with Patient

## 2021-07-19 NOTE — Progress Notes (Signed)
[place]  [phone] [phone]    ID: [name] [name] DOB: Jan 26, 1971  MR#: [id number]  TGP#:[id number]  Patient Care Team: Marda Stalker, PA-C as PCP - General (Family Medicine) Jerline Pain, MD as PCP - Cardiology (Cardiology) Nykolas Bacallao, Virgie Dad, MD as Consulting Physician (Oncology) Loreta Ave, MD as Referring Physician (Internal Medicine) Donato Heinz, MD as Consulting Physician (Nephrology) Kyung Rudd, MD as Consulting Physician (Radiation Oncology) Jovita Kussmaul, MD as Consulting Physician (General Surgery) Larey Dresser, MD as Consulting Physician (Cardiology) Mauro Kaufmann, RN as Oncology Nurse Navigator Rockwell Germany, RN as Oncology Nurse Navigator OTHER MD:   CHIEF COMPLAINT: 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   Recall she had an admission for pancreatitis a little over a month ago.  CT scan at that time showed enlargement of the pancreatic head and of course very elevated lipase.  Lipase levels were still elevated also avoid 12/21/2020 but trending down and PET scan just obtained 07/17/2021 shows no increased uptake in that area.  PET scan does show evidence of disease progression in lymph nodes: Left supraclavicular, mediastinal, subcarinal; there are no abdominopelvic lymph nodes with hypermetabolic activity.  There is increased activity and a right acetabular sclerotic lesion whereas additional sclerotic lesions throughout the spine do not have metabolic activity.  Finally there is no evidence of lung or liver involvement.  PET/CT also shows continuing gastric caloric activity  A repeat echo 07/19/2021     She continues on anastrozole.  She has no unusual side effects from this other than mild vaginal dryness  which also of course is due to menopause  She was switched to Zometa at slightly reduced dose on 05/09/2021. This is being repeated every 12 weeks  She received trastuzumab 06/11/2021.  The T-DM1 was held because of intercurrent pancreatitis.  Review of the literature finds 1 case report of possible TDM 1 induced pancreatitis--in short there is no clear association between the 2.  We are accordingly returning to T-DM1 today pending results of PET, to be scheduled  Finally she received Retacrit 06/11/2021.  This is written to be repeated every 3 weeks.  Her hemoglobin today is marginally improved.  REVIEW OF SYSTEMS: [name] currently has no symptoms of pancreatitis.  She is eating normally including peanut butter and eggs.  In fact she worries that she has not lost any weight.  She is walking most days at least a mile a day.  She no longer is is coughing.  She says the inhaler really was helpful.  She never got the gabapentin prescription she says.  Overall a detailed review of systems today was improved.   COVID 19 VACCINATION STATUS: [place] x2, most recently 03/2020; infection 04/2021   HISTORY OF CURRENT ILLNESS: From the original intake note:  [name] [name] palpated a lump on 01/15/2018. She felt soreness and shooting pain under her arm and in the right breast. She underwent bilateral diagnostic mammography with tomography and right breast ultrasonography at [facility] on 03/12/2018 showing: breast density category C. The is architectural distortion at the 11 o'clock position. There is also an oval mass in the right breast anterior depth. Examination of the right axilla showed enlarged lymph nodes. Ultrasonography showed a 4.6 cm mass in the right breast upper outer quadrant posterior depth. An additional 1.2 cm oval mass in the right  breast 12 o'clock middle depth. The lymph nodes in the right axillary are highly suggestive of malignancy.   Accordingly on 03/16/2018 she proceeded to biopsy of the right  breast area in question. The pathology from this procedure showed ([id number]): At both the 11 and 12 o'clock positions: Invasive ductal carcinoma grade II. Ductal carcinoma in situ, high grade, with necrosis and calcifications. Prognostic indicators significant for: estrogen receptor, 90% positive and progesterone receptor, 40% positive, both with strong staining intensity. Proliferation marker Ki67 at 80%. HER2 amplified with ratios HER2/CEP17 SIGNALS 6.90 and average HER2 copies per cell 14.50  The patient's subsequent history is as detailed below.   PAST MEDICAL HISTORY: Past Medical History:  Diagnosis Date   Anxiety    Breast cancer, left breast (HCC) 2000   Underwent lumpectomy, chemotherapy and radiation   Facial paralysis/Bells palsy    left side   Family history of lung cancer    Family history of lymphoma    Hypertension    Neuropathy    IN FINGERS AND TOES   RECENT INFUSION OF CHEMO   Personal history of breast cancer 05/06/2018   Renal disorder    just after TIA acute kidney injury   TIA (transient ischemic attack) 03/16/2017   [facility]  The patient has a previous history of left breast cancer diagnosed in 2000. She was seen by [facility] in [place], [place] under Dr. Marylou Flesher. According to the patient, her left breast cancer was stage II with no lymph node involvement (likely T2N0). She had chemotherapy every 3 weeks for about 6 months. She did not takes any "red drugs." She also did not take anti-estrogens (likely ER/PR negative).   TIA in 2018. She saw a neurologist as a precaution. She denies any clotting issues.    PAST SURGICAL HISTORY: Past Surgical History:  Procedure Laterality Date   BREAST LUMPECTOMY WITH AXILLARY LYMPH NODE BIOPSY Left 2000   biopsy   BREAST SURGERY     partial mastectomy with lymph node removal   ENDOBRONCHIAL ULTRASOUND Bilateral 09/05/2020   Procedure: ENDOBRONCHIAL ULTRASOUND;  Surgeon: Leslye Peer, MD;   Location: [facility] ENDOSCOPY;  Service: Cardiopulmonary;  Laterality: Bilateral;   MASTECTOMY Right 2019   & partial mastectomy left breast 2000   MASTECTOMY MODIFIED RADICAL Right 08/26/2018   Procedure: MASTECTOMY MODIFIED RADICAL;  Surgeon: Griselda Miner, MD;  Location: [facility] OR;  Service: General;  Laterality: Right;   MODIFIED MASTECTOMY Right 08/26/2018   PORTACATH PLACEMENT Left 04/08/2018   Procedure: INSERTION PORT-A-CATH;  Surgeon: Griselda Miner, MD;  Location: [facility] OR;  Service: General;  Laterality: Left;   TUBAL LIGATION Bilateral 2004   VIDEO BRONCHOSCOPY N/A 09/05/2020   Procedure: VIDEO BRONCHOSCOPY WITHOUT FLUORO;  Surgeon: Leslye Peer, MD;  Location: [facility];  Service: Cardiopulmonary;  Laterality: N/A;   WISDOM TOOTH EXTRACTION      FAMILY HISTORY Family History  Problem Relation Age of Onset   Hypertension Mother    Hypertension Father    Diverticulosis Father    Diabetes Father    Lung cancer Father 79       hx smoking   Hypertension Maternal Grandmother    CAD Maternal Grandmother    Alzheimer's disease Maternal Grandmother        died at 75   Hypertension Paternal Grandmother    CAD Paternal Grandmother    Stroke Paternal Grandfather    Sickle cell trait Paternal Grandfather    Pneumonia Maternal Aunt  died at a young adult   Lymphoma Paternal Aunt 44  The patient's father is alive at age 74 and has a history of lung cancer. The patient's mother is alive at age 50. The patient has 1 brother and no sisters. She denies a family history of breast or ovarian cancer.     GYNECOLOGIC HISTORY:  Patient's last menstrual period was 12/21/2017. Menarche: 50 years old Age at first live birth: 50 years old She is GXP3.  Her LMP was  January 2019.  The patient used oral contraceptive from [phone] and the Depo-provera shot from [phone] with no complications. She never used HRT.  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  She also worked at [employer] as a Occupational psychologist.  She is now disabled.  Her husband, [name], is a Administrator. The patient's son, Shea Stakes age 72, works at [employer] in [place]. The patient's daughters, Lilia Pro age 3, and Levonne Spiller age 6, are students. Lilia Pro plans to attend [employer] in the fall. The patient's adopted son, Vonna Kotyk age 36, is also a Ship broker.    ADVANCED DIRECTIVES: In the absence of any documents to the contrary the patient's husband is her healthcare power of attorney   HEALTH MAINTENANCE: Social History   Tobacco Use   Smoking status: Never   Smokeless tobacco: Never  Vaping Use   Vaping Use: Never used  Substance Use Topics   Alcohol use: No   Drug use: No     Colonoscopy: n/a  PAP: 2015  Bone density: never done   Allergies  Allergen Reactions   Ace Inhibitors Swelling    Swelling of lips and face   Lisinopril Swelling    Swelling of lips, face   Amlodipine Swelling    Leg swelling   Shellfish Allergy Swelling   Adhesive [Tape] Itching and Rash    Please use paper tape   Latex Itching and Rash    Current Outpatient Medications  Medication Sig Dispense Refill   acyclovir (ZOVIRAX) 400 MG tablet TAKE 1 TABLET BY MOUTH TWICE A DAY (Patient taking differently: Take 400 mg by mouth daily.) 180 tablet 1   albuterol (VENTOLIN HFA) 108 (90 Base) MCG/ACT inhaler Inhale 2 puffs into the lungs every 6 (six) hours as needed for wheezing or shortness of breath. 8 g 2   anastrozole (ARIMIDEX) 1 MG tablet Take 1 tablet (1 mg total) by mouth daily. 90 tablet 4   Ascorbic Acid (VITAMIN C) 1000 MG tablet Take 1,000 mg by mouth daily.     Chlorpheniramine-Pseudoeph (PSEUDEPHEDRINE PLUS PO) Take 1 tablet by mouth daily as needed (congestion).     cholecalciferol (VITAMIN D3) 25 MCG (1000 UNIT) tablet Take 1,000 Units by mouth daily.     Cyanocobalamin (VITAMIN B12) 500 MCG TABS Take 500 mcg by  mouth daily.      ELDERBERRY PO Take 1 tablet by mouth daily.     ferrous sulfate 325 (65 FE) MG tablet Take 1 tablet (325 mg total) by mouth daily. 30 tablet 0   lidocaine-prilocaine (EMLA) cream Apply 1 application topically as needed. (Patient taking differently: Apply 1 application topically daily as needed (port access).) 30 g 6   Multiple Vitamin (MULITIVITAMIN WITH MINERALS) TABS Take 1 tablet by mouth daily.     prochlorperazine (COMPAZINE) 10 MG tablet Take 1 tablet (10 mg total) by mouth every 6 (six) hours as  needed (Nausea or vomiting). (Patient not taking: No sig reported) 60 tablet 1   sodium bicarbonate 650 MG tablet Take 1 tablet (650 mg total) by mouth 2 (two) times daily. 60 tablet 1   traMADol (ULTRAM) 50 MG tablet Take 1-2 tablets (50-100 mg total) by mouth every 6 (six) hours as needed. 60 tablet 0   venlafaxine XR (EFFEXOR-XR) 37.5 MG 24 hr capsule TAKE 1 CAPSULE BY MOUTH DAILY WITH BREAKFAST. 90 capsule 1   No current facility-administered medications for this visit.    OBJECTIVE:  African-American woman who appears stated age  There were no vitals filed for this visit.  Wt Readings from Last 3 Encounters:  07/02/21 172 lb 14.4 oz (78.4 kg)  06/25/21 172 lb 12.8 oz (78.4 kg)  06/11/21 177 lb 7 oz (80.5 kg)   There is no height or weight on file to calculate BMI.    ECOG FS:1 - Symptomatic but completely ambulatory  Sclerae unicteric, EOMs intact Wearing a mask No cervical or supraclavicular adenopathy Lungs no rales or rhonchi Heart regular rate and rhythm Abd soft, nontender, positive bowel sounds MSK no focal spinal tenderness, no upper extremity lymphedema Neuro: nonfocal, well oriented, appropriate affect Breasts: The right breast is status post mastectomy.  There is no evidence of chest wall recurrence.  The left breast is benign.  Both axillae are benign.   LAB RESULTS:  CMP     Component Value Date/Time   NA 137 07/02/2021 0842   K 3.8  07/02/2021 0842   CL 109 07/02/2021 0842   CO2 20 (L) 07/02/2021 0842   GLUCOSE 134 (H) 07/02/2021 0842   BUN 45 (H) 07/02/2021 0842   CREATININE 1.85 (H) 07/02/2021 0842   CREATININE 2.77 (H) 05/30/2021 0917   CALCIUM 10.4 (H) 07/02/2021 0842   CALCIUM 11.2 (H) 02/24/2020 0830   PROT 7.2 07/02/2021 0842   ALBUMIN 2.8 (L) 07/02/2021 0842   AST 132 (H) 07/02/2021 0842   AST 96 (H) 05/30/2021 0917   ALT 84 (H) 07/02/2021 0842   ALT 56 (H) 05/30/2021 0917   ALKPHOS 143 (H) 07/02/2021 0842   BILITOT 0.7 07/02/2021 0842   BILITOT 1.8 (H) 05/30/2021 0917   GFRNONAA 33 (L) 07/02/2021 0842   GFRNONAA 20 (L) 05/30/2021 0917   GFRAA 41 (L) 08/30/2020 0808   GFRAA 45 (L) 07/31/2020 1400    Lab Results  Component Value Date   TOTALPROTELP 7.0 02/25/2019     Lab Results  Component Value Date   KPAFRELGTCHN 76.7 (H) 02/25/2019   LAMBDASER 27.2 (H) 02/25/2019   KAPLAMBRATIO 2.82 (H) 02/25/2019    Lab Results  Component Value Date   WBC 4.5 07/02/2021   NEUTROABS 3.1 07/02/2021   HGB 8.8 (L) 07/02/2021   HCT 26.6 (L) 07/02/2021   MCV 91.7 07/02/2021   PLT 72 (L) 07/02/2021   No results found for: LABCA2  No components found for: [id number]  No results for input(s): INR in the last 168 hours.   No results found for: LABCA2  Lab Results  Component Value Date   CAN199 90 (H) 06/07/2021    No results found for: RRN165  No results found for: BXU383  Lab Results  Component Value Date   CA2729 63.5 (H) 06/07/2021    No components found for: HGQUANT  No results found for: CEA1 / No results found for: CEA1   No results found for: AFPTUMOR  No results found for: [place]  No results found for: HGBA, HGBA2QUANT,  HGBFQUANT, HGBSQUAN (Hemoglobinopathy evaluation)   No results found for: LDH  No results found for: IRON, TIBC, IRONPCTSAT (Iron and TIBC)  Lab Results  Component Value Date   FERRITIN 98 12/27/2020    Urinalysis    Component Value Date/Time    COLORURINE YELLOW 05/30/2021 1153   APPEARANCEUR CLEAR 05/30/2021 1153   LABSPEC 1.010 05/30/2021 1153   PHURINE 5.0 05/30/2021 1153   GLUCOSEU NEGATIVE 05/30/2021 1153   HGBUR SMALL (A) 05/30/2021 1153   BILIRUBINUR NEGATIVE 05/30/2021 1153   [place] 05/30/2021 1153   PROTEINUR 30 (A) 05/30/2021 1153   UROBILINOGEN 0.2 11/29/2011 1656   NITRITE NEGATIVE 05/30/2021 1153   LEUKOCYTESUR NEGATIVE 05/30/2021 1153    STUDIES: DG Chest 2 View  Result Date: 06/25/2021 CLINICAL DATA:  Cough.  History stage IV breast cancer.  Hemoptysis. EXAM: CHEST - 2 VIEW COMPARISON:  06/04/2021 FINDINGS: Left chest wall port a catheter is noted with tip in the projection of the SVC surgical clips are noted within bilateral axilla and right hilar region. Unchanged interstitial prominence within the right upper lobe which is favored to represent postinflammatory change. No pleural effusion or edema. No superimposed airspace consolidation. Visualized osseous structures are unremarkable IMPRESSION: No acute cardiopulmonary abnormalities. Electronically Signed   By: Kerby Moors M.D.   On: 06/25/2021 12:32   NM PET Image Restag (PS) Skull Base To Thigh  Result Date: 07/17/2021 CLINICAL DATA:  Subsequent treatment strategy for breast carcinoma. EXAM: NUCLEAR MEDICINE PET SKULL BASE TO THIGH TECHNIQUE: 8.4 mCi F-18 FDG was injected intravenously. Full-ring PET imaging was performed from the skull base to thigh after the radiotracer. CT data was obtained and used for attenuation correction and anatomic localization. Fasting blood glucose: 88 mg/dl COMPARISON:  None. FINDINGS: Mediastinal blood pool activity: SUV max 2.7 Liver activity: SUV max NA NECK: New hypermetabolic LEFT supraclavicular node measures only 6 mm (image 40) but has intense metabolic activity SUV max equal 7.4. Incidental CT findings: none CHEST: Progression mediastinal adenopathy. Example prevascular node measures 16 mm compared to 9 mm  with SUV max equal 14.2 compared SUV max equal 8.3 (image 68). New subcarinal lymph node along the RIGHT bronchus intermedius measures 12 mm with SUV max equal 14.6. Similar hypermetabolic RIGHT hilar node with SUV max equal 15 compared SUV max equal 16.3. Incidental CT findings: No suspicious pulmonary nodules. Port in the anterior chest wall with tip in distal SVC. ABDOMEN/PELVIS: Hypermetabolic activity through the gastric pyloric region not changed from prior. No evidence of liver metastasis. No hypermetabolic abdominopelvic lymph nodes. Metabolic activity at the terminal ileum is typically physiologic. Incidental CT findings: none SKELETON: Intense metabolic activity associated with a RIGHT acetabular sclerotic lesion measuring 10 mm (158) with SUV max equal 16.8 compared SUV max equal 8.1. No change in size. Additional sclerotic lesions scattered through the spine not have metabolic activity. Lesions not changed CT parents from prior. Incidental CT findings: none IMPRESSION: 1. Progression metastatic disease in the central thorax with a new hypermetabolic LEFT supraclavicular node in new hypermetabolic subcarinal node. Prevascular nodes are increased in size and metabolic activity. 2. Stable hypermetabolic RIGHT hilar node. 3. 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 Additional sclerotic skeletal lesions are unchanged. Electronically Signed   By: Suzy Bouchard M.D.   On: 07/17/2021 11:01      ELIGIBLE FOR AVAILABLE RESEARCH PROTOCOL: no   ASSESSMENT: 50 y.o. [place], [place] woman  (1) status post left lumpectomy in 2000 for  a (?) T2N0 breast cancer,  (a) status post adjuvant chemotherapy  (b) status post adjuvant radiation  (c) did not receive antiestrogens  (2) genetics testing May 18, 2018 through the common Hereditary Cancers Panel + Myelodysplastic Syndrome/Leukemia Panel found no deleterious mutations in APC, ATM, AXIN2, BARD1, BLM,  BMPR1A, BRCA1, BRCA2, BRIP1, CDH1, CDK4, CDKN2A (p14ARF), CDKN2A (p16INK4a), CEBPA, CHEK2, CTNNA1, DICER1, EPCAM*, GATA2, GREM1*, HRAS, KIT, MEN1, MLH1, MSH2, MSH3, MSH6, MUTYH, NBN, NF1, PALB2, PDGFRA, PMS2, POLD1, POLE, PTEN, RAD50, RAD51C, RAD51D, RUNX1, SDHB, SDHC, SDHD, SMAD4, SMARCA4, STK11, TERC, TERT, TP53, TSC1, TSC2, VHL The following genes were evaluated for sequence changes only: HOXB13*, NTHL1*, SDHA  (a)  3 variants of uncertain significance were identified in the genes BARD1 c.764A>G (p.Asn255Ser), BRIP1 c.2563C>T (p.Arg855Cys), and PALB2 c.3103A>T (p.Ile1035Phe).   (3) status post right breast biopsy 03/16/2018 for a clinical mT2-3 N2, anatomic stage III invasive ductal carcinoma, grade 2, triple positive, with an MIB-1 of 80%.  (a) bone scan and chest CT scan negative for metastases except for a T6 lytic lesion of concern  (b) thoracic spine MRI was ordered May 2019 but never performed.  (4) neoadjuvant chemotherapy consisting of carboplatin, docetaxel, trastuzumab and Pertuzumab every 3 weeks x 6 starting 04/16/2018  (a) pertuzumab held beginning with cycle 2 because of diarrhea  (5) trastuzumab was to be continued Q4w indefinitely, discontinued after 10/21/2019 dose with progression  (a) echocardiogram on 06/29/2018 showed an ejection fraction in the 65-70% range  (b) echocardiogram 11/19/2018 showed an ejection fraction in the 60-65% range  (c) echo 04/07/2019 shows an ejection fraction in the 55-60% range  (d) echo 07/15/2019 shows EF of 60-65%--for additional echoes see under #12 below  (6) right mastectomy and sentinel lymph node sampling on 08/26/2018 found a residual 2.5cm invasive ductal carcinoma, with 2 of 5 sentinel lymph nodes positive, ypT2, ypN1a; margins were clear  (a) repeat prognostic panel confirms tumor still triple positive (HER2 3+)  (7) adjuvant radiation 09/30/2018 - 11/16/2018  Site/dose:   The patient initially received a dose of 50.4 Gy in 28 fractions  to the right chest wall and supraclavicular region. This was delivered using a 3-D conformal, 4 field technique. The patient then received a boost to the mastectomy scar. This delivered an additional 10 Gy in 5 fractions using an en face electron field. The total dose was 60.4   (8) anastrozole started 12/03/2018, discontinued August 2022 with progression  (a) [place] on 10/01/2018 was 64.3, and estradiol 7.0, consistent with menopause  (b) referral to pelvic floor rehabilitation 12/03/2018  (c) [place] on 03/16/2020 was 80.8 with estradiol less than 2.5   METASTATIC DISEASE: March 2020 (9) CT of neck and CT angiography of the chest 02/04/2019 finds the T6 lytic lesion noted May 2019 (see #3 above) is now sclerotic; a sclerotic T3 lesion is stable; there is a new lytic lesion in the manubrium  (a) PET scan 03/12/2019 shows manubrium, T3 and T5 metastases, no visceral disease  (b) biopsy of manubrial lesion planned but not done secondary to pandemic  (c) Bone scan 09/01/19 shows ? progression of disease in spine; MRI 09/02/19 shows early metastases in T10 (new), T3 and T5 stable; CT chest 09/01/19 shows 18mm pulmonary nodules that are new, but nonspecific and warrant future follow up  (d) PET 10/26/2019 shows no lung or liver lesions; new mediastinal adenopathy, bone lesions-- T-DM1 started  (e) PET scan 03/08/2020 shows resolution of the mediastinal adenopathy and hypermetabolic bone lesions, questionable subcutaneous nodule along the  left lateral shoulder  (f) for additional staging studies see under "12" below  (10) SRS to spine, palliative treatment to sternum, from 05/13/2019 through 05/26/2019:  1. Chest_sternum // 40 Gy in 10 fractions  2. Thoracic Spine (T3) // 18 Gy in 1 fraction   3. Thoracic Spine (T5) // 18 Gy in 1 fraction   (11) denosumab/Xgeva started 09/08/2019, given every 6 weeks  (12) T-DM1 started 01/15/20201, discontinued after 07/02/2021 dose with progression  (a) echo 10/26/2019 shows  stable EF at 55-60%  (b) echo 03/08/2020 shows an ejection fraction in the 70-75% range  (c) echo 07/12/2020 shows an ejection fraction in the 65 to 70% range.  (d) PET scan 03/08/2020 shows no active disease; a 0.9 cm subcutaneous nodule in the left shoulder area seen on the PET was not identifiable by exam 03/16/2020  (e) PET 08/24/2020 shows stable disease except for a new 0.6 cm lymph node with an SUV of 10.1  (f) bronchoscopy 09/05/2020 with endobronchial ultrasound found no detectable mass in the region noted on the September PET scan  (g) T-DM1 discontinued and trastuzumab resumed 11/29/2020  (h) PET scan 02/26/2021 shows mild nodular increase: TDM-1 resumed 03/07/2021, discontinued after 05/09/2021 dose pending restaging studies  (i) noncontrast chest CT 06/01/2021 shows possibly mildly enlarged adenopathy, and an apparently new sclerotic lesion at T8.  There was also evidence of pancreatitis and possible pneumonitis  (j) PET scan 07/17/2021 shows progression in lymph nodes and bone, but no evidence of involvement of lung or liver and no evidence of residual pancreatitis.  There is continued unexplained gastric pyloric hypermetabolic activity.  (13) Enhertu started 07/23/2021, repeated every 21 days  (14) fulvestrant Q 21 days (alternating 500/250 mg doses) started 07/23/2021  PLAN: [place]'s PET scan shows evidence of progression.  Luckily this is mostly lymph nodes and bone.  We do not see evidence of involvement of the lungs or liver.Her pancreatitis has resolved  The question is do we want to intensify the anti-HER2 treatment, intensify the antiestrogen treatment, change the bony metastatic treatment or any combination of the above.  She has been on TDM 1 for a year and a half.  The CT of the chest obtained in July, where she had had no break in treatment, showed some evidence of progression.  Accordingly we will go to [facility] for her HER2 treatment.  I am discontinuing the  anastrozole and starting fulvestrant.  I am going to be dosing this every 3weeks, 250 alternating with 500, which at to the same total dose every 3 months and does not force the patient to come into the office additional times just for shot.  The Retacrit and [place] See are also dosed every 3 and 6 weeks respectively for the same reason  I am going to request an EGD from GI to further evaluate the pyloric uptake area.  We can switch her from anastrozole to fulvestrant and from zoledronate to denosumab/Xgeva.  This would mean she gets 2 shots every 4 weeks.  It is renal sparing, and given her renal problems that would be helpful.  The plan then would be to implement this and restage after 3 months.  I think she would benefit from an EGD so we can try to figure out exactly why she has pyloric uptake.    had an episode of pancreatitis which clinically has resolved.  We have a repeat lipase pending today.  We have not been able to get her PET scan to accurately restage her but  I was just told that it has been approved so hopefully within the next week or so we will get that done.  That will allow [place] to tell whether we need to change from T-DM1 to [facility] or continue the treatment as we have been doing.  I commended her exercise program and her diet and suggested she cut back on the rice bread on [place] that she eats during the week.  We will try to get her echo scheduled between now and her next treatment here.  Total encounter time 25 minutes.Sarajane Jews C. Daxon Kyne, MD 07/19/21 7:35 AM Medical Oncology and Hematology [facility] [place], [place] [address] Tel. [phone]    Fax. [phone]   I, Wilburn Mylar, am acting as scribe for Dr. Virgie Dad. Autumne Kallio.  I, Lurline Del MD, have reviewed the above documentation for accuracy and completeness, and I agree with the above.   *Total Encounter Time as defined by the Centers for Medicare and Medicaid  Services includes, in addition to the face-to-face time of a patient visit (documented in the note above) non-face-to-face time: obtaining and reviewing outside history, ordering and reviewing medications, tests or procedures, care coordination (communications with other health care professionals or caregivers) and documentation in the medical record.

## 2021-07-20 MED FILL — Dexamethasone Sodium Phosphate Inj 100 MG/10ML: INTRAMUSCULAR | Qty: 1 | Status: AC

## 2021-07-22 NOTE — Progress Notes (Addendum)
[place]  [phone] [phone]    ID: [name] [name] DOB: 1971-08-04  MR#: [id number]  XKP#:[id number]  Patient Care Team: Marda Stalker, PA-C as PCP - General (Family Medicine) Jerline Pain, MD as PCP - Cardiology (Cardiology) Brinae Woods, Virgie Dad, MD as Consulting Physician (Oncology) Loreta Ave, MD as Referring Physician (Internal Medicine) Donato Heinz, MD as Consulting Physician (Nephrology) Kyung Rudd, MD as Consulting Physician (Radiation Oncology) Jovita Kussmaul, MD as Consulting Physician (General Surgery) Larey Dresser, MD as Consulting Physician (Cardiology) Mauro Kaufmann, RN as Oncology Nurse Navigator Rockwell Germany, RN as Oncology Nurse Navigator Raina Mina, RPH-CPP (Pharmacist) OTHER MD:   CHIEF COMPLAINT: 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   In light of disease progression, we are switching her to Enherrtu and fulvestrant. Tp review: [name] was admitted in July with pancreatitis, cause unclear. non-contrast chest CT 06/01/2021 showed enlargement of the pancreatic head with surrounding inflammation. It also suggested increased adenoapthy and a new bone lesion at T8. She received supportive care and was discharged 06/05/2021 asymptomatic. We repeated a PET scan 07/17/2021 showing:  1. Progression metastatic disease in the central thorax with a new hypermetabolic LEFT supraclavicular node in new hypermetabolic subcarinal node. Prevascular nodes are increased in size and metabolic activity. 2. Stable hypermetabolic RIGHT hilar node. 3. 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 Additional sclerotic skeletal lesions are unchanged.  Notably, there was no lung or  liver involvement and the pancreatic area showed no increased activity. The largest active node was 1.6 cm.  A repeat echo 07/19/2021 was stable.  She is here today to discuss those findings and start her new treatments.   REVIEW OF SYSTEMS: [name] has some slow days but tries to stay as active as possible, taking walks, and doing water aerobics.  She has a cough that occasionally wakes her up at night.  It is nonproductive.  She is not short of breath.  She says her family keeps her busy.  She has some discomfort in the right upper quadrant of the abdomen but not epigastric.  There is no nausea or vomiting.  She is minimally constipated.  She denies pain.  Detailed review of systems today was otherwise stable.   COVID 19 VACCINATION STATUS: [place] x2, most recently 03/2020; infection 04/2021   HISTORY OF CURRENT ILLNESS: From the original intake note:  [name] Schauf palpated a lump on 01/15/2018. She felt soreness and shooting pain under her arm and in the right breast. She underwent bilateral diagnostic mammography with tomography and right breast ultrasonography at [facility] on 03/12/2018 showing: breast density category C. The is architectural distortion at the 11 o'clock position. There is also an oval mass in the right breast anterior depth. Examination of the right axilla showed enlarged lymph nodes. Ultrasonography showed a 4.6 cm mass in the right breast upper outer quadrant posterior depth. An additional 1.2 cm oval mass in the right breast 12 o'clock middle depth. The lymph nodes in the right axillary are highly suggestive of malignancy.   Accordingly on 03/16/2018 she proceeded to biopsy of the right breast area in question. The pathology from this procedure showed ([id number]): At both the 11 and 12 o'clock positions: Invasive ductal carcinoma grade II. Ductal carcinoma in situ, high grade, with necrosis and calcifications. Prognostic indicators  significant for: estrogen receptor, 90%  positive and progesterone receptor, 40% positive, both with strong staining intensity. Proliferation marker Ki67 at 80%. HER2 amplified with ratios HER2/CEP17 SIGNALS 6.90 and average HER2 copies per cell 14.50  The patient's subsequent history is as detailed below.   PAST MEDICAL HISTORY: Past Medical History:  Diagnosis Date   Anxiety    Breast cancer, left breast ([place]) 2000   Underwent lumpectomy, chemotherapy and radiation   Facial paralysis/Bells palsy    left side   Family history of lung cancer    Family history of lymphoma    Hypertension    Neuropathy    IN FINGERS AND TOES   RECENT INFUSION OF CHEMO   Personal history of breast cancer 05/06/2018   Renal disorder    just after TIA acute kidney injury   TIA (transient ischemic attack) 03/16/2017   [place] [facility]  The patient has a previous history of left breast cancer diagnosed in 2000. She was seen by [facility] in [place], [place] under Dr. Loreta Ave. According to the patient, her left breast cancer was stage II with no lymph node involvement (likely T2N0). She had chemotherapy every 3 weeks for about 6 months. She did not takes any "red drugs." She also did not take anti-estrogens (likely ER/PR negative).   TIA in 2018. She saw a neurologist as a precaution. She denies any clotting issues.    PAST SURGICAL HISTORY: Past Surgical History:  Procedure Laterality Date   BREAST LUMPECTOMY WITH AXILLARY LYMPH NODE BIOPSY Left 2000   biopsy   BREAST SURGERY     partial mastectomy with lymph node removal   ENDOBRONCHIAL ULTRASOUND Bilateral 09/05/2020   Procedure: ENDOBRONCHIAL ULTRASOUND;  Surgeon: Collene Gobble, MD;  Location: [facility];  Service: Cardiopulmonary;  Laterality: Bilateral;   MASTECTOMY Right 2019   & partial mastectomy left breast 2000   MASTECTOMY MODIFIED RADICAL Right 08/26/2018   Procedure: MASTECTOMY MODIFIED RADICAL;  Surgeon: Jovita Kussmaul, MD;  Location: [place];  Service:  General;  Laterality: Right;   MODIFIED MASTECTOMY Right 08/26/2018   PORTACATH PLACEMENT Left 04/08/2018   Procedure: INSERTION PORT-A-CATH;  Surgeon: Jovita Kussmaul, MD;  Location: [place];  Service: General;  Laterality: Left;   TUBAL LIGATION Bilateral 2004   VIDEO BRONCHOSCOPY N/A 09/05/2020   Procedure: VIDEO BRONCHOSCOPY WITHOUT FLUORO;  Surgeon: Collene Gobble, MD;  Location: [facility];  Service: Cardiopulmonary;  Laterality: N/A;   WISDOM TOOTH EXTRACTION      FAMILY HISTORY Family History  Problem Relation Age of Onset   Hypertension Mother    Hypertension Father    Diverticulosis Father    Diabetes Father    Lung cancer Father 54       hx smoking   Hypertension Maternal Grandmother    CAD Maternal Grandmother    Alzheimer's disease Maternal Grandmother        died at 27   Hypertension Paternal Grandmother    CAD Paternal Grandmother    Stroke Paternal Grandfather    Sickle cell trait Paternal Grandfather    Pneumonia Maternal Aunt        died at a young adult   Lymphoma Paternal Aunt 54  The patient's father is alive at age 57 and has a history of lung cancer. The patient's mother is alive at age 54. The patient has 1 brother and no sisters. She denies a family history of breast or ovarian cancer.     GYNECOLOGIC HISTORY:  Patient's last menstrual period was 12/21/2017. Menarche: 50 years old Age at first live birth: 50 years old She is GXP3.  Her LMP was  January 2019.  The patient used oral contraceptive from [phone] and the Depo-provera shot from [phone] with no complications. She never used HRT.  FSH repeatedly >70 and estradiol <2.5 (from OCT 2020)  SOCIAL HISTORY:  [place] worked as a Agricultural consultant for [employer].  She also worked at [employer] as a Associate Professor.  She is now disabled.  Her husband, Ladene Artist, is a Naval architect. The patient's son, Jacquenette Shone age 38, works at [employer] in [place]. The patient's daughters, Lequita Halt age 66,  and Eilene Ghazi age 6, are students. Lequita Halt plans to attend [employer] in the fall. The patient's adopted son, Ivin Booty age 51, is also a Consulting civil engineer.    ADVANCED DIRECTIVES: In the absence of any documents to the contrary the patient's husband is her healthcare power of attorney   HEALTH MAINTENANCE: Social History   Tobacco Use   Smoking status: Never   Smokeless tobacco: Never  Vaping Use   Vaping Use: Never used  Substance Use Topics   Alcohol use: No   Drug use: No     Colonoscopy: n/a  PAP: 2015  Bone density: never done   Allergies  Allergen Reactions   Ace Inhibitors Swelling    Swelling of lips and face   Lisinopril Swelling    Swelling of lips, face   Amlodipine Swelling    Leg swelling   Shellfish Allergy Swelling   Adhesive [Tape] Itching and Rash    Please use paper tape   Latex Itching and Rash    Current Outpatient Medications  Medication Sig Dispense Refill   albuterol (VENTOLIN HFA) 108 (90 Base) MCG/ACT inhaler Inhale 2 puffs into the lungs every 6 (six) hours as needed for wheezing or shortness of breath. 8 g 2   Ascorbic Acid (VITAMIN C) 1000 MG tablet Take 1,000 mg by mouth daily.     carvedilol (COREG) 3.125 MG tablet Take 3.125 mg by mouth 2 (two) times daily with a meal.     cholecalciferol (VITAMIN D3) 25 MCG (1000 UNIT) tablet Take 1,000 Units by mouth daily.     Cyanocobalamin (VITAMIN B12) 500 MCG TABS Take 500 mcg by mouth daily.      dexamethasone (DECADRON) 4 MG tablet Take 2 tablets (8 mg total) by mouth daily. Start the day after chemotherapy for 2 days. 30 tablet 1   ferrous sulfate 325 (65 FE) MG tablet Take 1 tablet (325 mg total) by mouth daily. 30 tablet 0   guaiFENesin (ROBITUSSIN) 100 MG/5ML liquid Take 200 mg by mouth 3 (three) times daily as needed for cough.     lidocaine-prilocaine (EMLA) cream Apply to affected area once 30 g 3   losartan (COZAAR) 50 MG tablet Take 50 mg by mouth daily.     Multiple Vitamin (MULITIVITAMIN  WITH MINERALS) TABS Take 1 tablet by mouth daily.     prochlorperazine (COMPAZINE) 10 MG tablet Take 1 tablet (10 mg total) by mouth every 6 (six) hours as needed (Nausea or vomiting). 30 tablet 1   traMADol (ULTRAM) 50 MG tablet Take 1-2 tablets (50-100 mg total) by mouth every 6 (six) hours as needed. 60 tablet 0   venlafaxine XR (EFFEXOR-XR) 37.5 MG 24 hr capsule TAKE 1 CAPSULE BY MOUTH DAILY WITH BREAKFAST. 90 capsule 1   No current facility-administered medications for this visit.  OBJECTIVE:  African-American woman who appears stated age  15:   07/23/21 0809  BP: (!) 114/51  Pulse: 69  Resp: 18  Temp: (!) 97.5 F (36.4 C)  SpO2: 98%    Wt Readings from Last 3 Encounters:  07/23/21 172 lb 6.4 oz (78.2 kg)  07/19/21 170 lb 4 oz (77.2 kg)  07/02/21 172 lb 14.4 oz (78.4 kg)   Body mass index is 34.82 kg/m.    ECOG FS:1 - Symptomatic but completely ambulatory  Sclerae unicteric, EOMs intact Wearing a mask The left supraclavicular node noted on PET scan is not palpable Lungs no rales or rhonchi, no wheezes Heart regular rate and rhythm Abd soft, nontender to moderate palpation, positive bowel sounds MSK no focal spinal tenderness, grade 1 left upper extremity lymphedema Neuro: nonfocal, well oriented, appropriate affect Breasts: Right breast is status post mastectomy.  There is no evidence of chest wall recurrence.  The left breast is benign.   LAB RESULTS:  CMP     Component Value Date/Time   NA 137 07/02/2021 0842   K 3.8 07/02/2021 0842   CL 109 07/02/2021 0842   CO2 20 (L) 07/02/2021 0842   GLUCOSE 134 (H) 07/02/2021 0842   BUN 45 (H) 07/02/2021 0842   CREATININE 1.85 (H) 07/02/2021 0842   CREATININE 2.77 (H) 05/30/2021 0917   CALCIUM 10.4 (H) 07/02/2021 0842   CALCIUM 11.2 (H) 02/24/2020 0830   PROT 7.2 07/02/2021 0842   ALBUMIN 2.8 (L) 07/02/2021 0842   AST 132 (H) 07/02/2021 0842   AST 96 (H) 05/30/2021 0917   ALT 84 (H) 07/02/2021 0842   ALT  56 (H) 05/30/2021 0917   ALKPHOS 143 (H) 07/02/2021 0842   BILITOT 0.7 07/02/2021 0842   BILITOT 1.8 (H) 05/30/2021 0917   GFRNONAA 33 (L) 07/02/2021 0842   GFRNONAA 20 (L) 05/30/2021 0917   GFRAA 41 (L) 08/30/2020 0808   GFRAA 45 (L) 07/31/2020 1400    Lab Results  Component Value Date   TOTALPROTELP 7.0 02/25/2019     Lab Results  Component Value Date   KPAFRELGTCHN 76.7 (H) 02/25/2019   LAMBDASER 27.2 (H) 02/25/2019   KAPLAMBRATIO 2.82 (H) 02/25/2019    Lab Results  Component Value Date   WBC 4.3 07/23/2021   NEUTROABS 2.6 07/23/2021   HGB 8.9 (L) 07/23/2021   HCT 26.7 (L) 07/23/2021   MCV 89.9 07/23/2021   PLT 69 (L) 07/23/2021   No results found for: LABCA2  No components found for: NLZJQB341  No results for input(s): INR in the last 168 hours.   No results found for: LABCA2  Lab Results  Component Value Date   CAN199 90 (H) 06/07/2021    No results found for: PFX902  No results found for: IOX735  Lab Results  Component Value Date   CA2729 63.5 (H) 06/07/2021    No components found for: HGQUANT  No results found for: CEA1 / No results found for: CEA1   No results found for: AFPTUMOR  No results found for: CHROMOGRNA  No results found for: HGBA, HGBA2QUANT, HGBFQUANT, HGBSQUAN (Hemoglobinopathy evaluation)   No results found for: LDH  No results found for: IRON, TIBC, IRONPCTSAT (Iron and TIBC)  Lab Results  Component Value Date   FERRITIN 98 12/27/2020    Urinalysis    Component Value Date/Time   COLORURINE YELLOW 05/30/2021 [place] 05/30/2021 1153   LABSPEC 1.010 05/30/2021 1153   PHURINE 5.0 05/30/2021 1153   GLUCOSEU  NEGATIVE 05/30/2021 1153   HGBUR SMALL (A) 05/30/2021 1153   BILIRUBINUR NEGATIVE 05/30/2021 1153   KETONESUR NEGATIVE 05/30/2021 1153   PROTEINUR 30 (A) 05/30/2021 1153   UROBILINOGEN 0.2 11/29/2011 1656   NITRITE NEGATIVE 05/30/2021 1153   LEUKOCYTESUR NEGATIVE 05/30/2021 1153     STUDIES: DG Chest 2 View  Result Date: 06/25/2021 CLINICAL DATA:  Cough.  History stage IV breast cancer.  Hemoptysis. EXAM: CHEST - 2 VIEW COMPARISON:  06/04/2021 FINDINGS: Left chest wall port a catheter is noted with tip in the projection of the SVC surgical clips are noted within bilateral axilla and right hilar region. Unchanged interstitial prominence within the right upper lobe which is favored to represent postinflammatory change. No pleural effusion or edema. No superimposed airspace consolidation. Visualized osseous structures are unremarkable IMPRESSION: No acute cardiopulmonary abnormalities. Electronically Signed   By: Kerby Moors M.D.   On: 06/25/2021 12:32   NM PET Image Restag (PS) Skull Base To Thigh  Result Date: 07/17/2021 CLINICAL DATA:  Subsequent treatment strategy for breast carcinoma. EXAM: NUCLEAR MEDICINE PET SKULL BASE TO THIGH TECHNIQUE: 8.4 mCi F-18 FDG was injected intravenously. Full-ring PET imaging was performed from the skull base to thigh after the radiotracer. CT data was obtained and used for attenuation correction and anatomic localization. Fasting blood glucose: 88 mg/dl COMPARISON:  None. FINDINGS: Mediastinal blood pool activity: SUV max 2.7 Liver activity: SUV max NA NECK: New hypermetabolic LEFT supraclavicular node measures only 6 mm (image 40) but has intense metabolic activity SUV max equal 7.4. Incidental CT findings: none CHEST: Progression mediastinal adenopathy. Example prevascular node measures 16 mm compared to 9 mm with SUV max equal 14.2 compared SUV max equal 8.3 (image 68). New subcarinal lymph node along the RIGHT bronchus intermedius measures 12 mm with SUV max equal 14.6. Similar hypermetabolic RIGHT hilar node with SUV max equal 15 compared SUV max equal 16.3. Incidental CT findings: No suspicious pulmonary nodules. Port in the anterior chest wall with tip in distal SVC. ABDOMEN/PELVIS: Hypermetabolic activity through the gastric pyloric  region not changed from prior. No evidence of liver metastasis. No hypermetabolic abdominopelvic lymph nodes. Metabolic activity at the terminal ileum is typically physiologic. Incidental CT findings: none SKELETON: Intense metabolic activity associated with a RIGHT acetabular sclerotic lesion measuring 10 mm (158) with SUV max equal 16.8 compared SUV max equal 8.1. No change in size. Additional sclerotic lesions scattered through the spine not have metabolic activity. Lesions not changed CT parents from prior. Incidental CT findings: none IMPRESSION: 1. Progression metastatic disease in the central thorax with a new hypermetabolic LEFT supraclavicular node in new hypermetabolic subcarinal node. Prevascular nodes are increased in size and metabolic activity. 2. Stable hypermetabolic RIGHT hilar node. 3. 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 Additional sclerotic skeletal lesions are unchanged. Electronically Signed   By: Suzy Bouchard M.D.   On: 07/17/2021 11:01   ECHOCARDIOGRAM COMPLETE  Result Date: 07/19/2021    ECHOCARDIOGRAM REPORT   Patient Name:   [name] Wayson Date of Exam: 07/19/2021 Medical Rec #:  [id number]     Height:       59.0 in Accession #:    [id number]    Weight:       172.9 lb Date of Birth:  November 25, 1971     BSA:          1.734 m Patient Age:    53 years      BP:  135/84 mmHg Patient Gender: F             HR:           64 bpm. Exam Location:  Outpatient Procedure: 2D Echo, 3D Echo, Color Doppler, Cardiac Doppler and Strain Analysis Indications:    I50.9* Heart failure (unspecified)  History:        Patient has prior history of Echocardiogram examinations, most                 recent 02/13/2021. Risk Factors:Hypertension. Breast Cancer.  Sonographer:    Raquel Sarna Senior RDCS Referring Phys: [id number] DANIEL R BENSIMHON IMPRESSIONS  1. Left ventricular ejection fraction, by estimation, is 60 to 65%. The left ventricle has normal  function. The left ventricle has no regional wall motion abnormalities. Left ventricular diastolic parameters were normal. The average left ventricular global longitudinal strain is -19.2 %.  2. Right ventricular systolic function is normal. The right ventricular size is normal.  3. The mitral valve is normal in structure. Trivial mitral valve regurgitation. No evidence of mitral stenosis.  4. The aortic valve is normal in structure. There is mild calcification of the aortic valve. Aortic valve regurgitation is not visualized. No aortic stenosis is present.  5. The inferior vena cava is normal in size with greater than 50% respiratory variability, suggesting right atrial pressure of 3 mmHg. FINDINGS  Left Ventricle: Left ventricular ejection fraction, by estimation, is 60 to 65%. The left ventricle has normal function. The left ventricle has no regional wall motion abnormalities. The average left ventricular global longitudinal strain is -19.2 %. The left ventricular internal cavity size was normal in size. There is no left ventricular hypertrophy. Left ventricular diastolic parameters were normal. Right Ventricle: The right ventricular size is normal. No increase in right ventricular wall thickness. Right ventricular systolic function is normal. Left Atrium: Left atrial size was normal in size. Right Atrium: Right atrial size was normal in size. Pericardium: There is no evidence of pericardial effusion. Mitral Valve: The mitral valve is normal in structure. Trivial mitral valve regurgitation. No evidence of mitral valve stenosis. Tricuspid Valve: The tricuspid valve is normal in structure. Tricuspid valve regurgitation is trivial. No evidence of tricuspid stenosis. Aortic Valve: The aortic valve is normal in structure. There is mild calcification of the aortic valve. Aortic valve regurgitation is not visualized. No aortic stenosis is present. Pulmonic Valve: The pulmonic valve was normal in structure. Pulmonic  valve regurgitation is not visualized. No evidence of pulmonic stenosis. Aorta: The aortic root is normal in size and structure. Venous: The inferior vena cava is normal in size with greater than 50% respiratory variability, suggesting right atrial pressure of 3 mmHg. IAS/Shunts: No atrial level shunt detected by color flow Doppler.  LEFT VENTRICLE PLAX 2D LVIDd:         4.30 cm  Diastology LVIDs:         2.70 cm  LV e' medial:    7.72 cm/s LV PW:         1.20 cm  LV E/e' medial:  9.9 LV IVS:        1.00 cm  LV e' lateral:   7.72 cm/s LVOT diam:     1.90 cm  LV E/e' lateral: 9.9 LV SV:         71 LV SV Index:   41       2D Longitudinal Strain LVOT Area:     2.84 cm 2D Strain GLS Avg:     -  19.2 %                          3D Volume EF:                         3D EF:        60 %                         LV EDV:       186 ml                         LV ESV:       74 ml                         LV SV:        112 ml RIGHT VENTRICLE RV S prime:     8.92 cm/s TAPSE (M-mode): 2.5 cm LEFT ATRIUM             Index       RIGHT ATRIUM           Index LA diam:        3.50 cm 2.02 cm/m  RA Area:     14.80 cm LA Vol (A2C):   54.8 ml 31.61 ml/m RA Volume:   33.70 ml  19.44 ml/m LA Vol (A4C):   40.0 ml 23.07 ml/m LA Biplane Vol: 48.0 ml 27.69 ml/m  AORTIC VALVE LVOT Vmax:   123.00 cm/s LVOT Vmean:  84.900 cm/s LVOT VTI:    0.250 m  AORTA Ao Root diam: 3.00 cm Ao Asc diam:  3.20 cm MITRAL VALVE MV Area (PHT): 3.15 cm    SHUNTS MV Decel Time: 241 msec    Systemic VTI:  0.25 m MV E velocity: 76.30 cm/s  Systemic Diam: 1.90 cm MV A velocity: 50.10 cm/s MV E/A ratio:  1.52 Glori Bickers MD Electronically signed by Glori Bickers MD Signature Date/Time: 07/19/2021/3:28:28 PM    Final       ELIGIBLE FOR AVAILABLE RESEARCH PROTOCOL: no   ASSESSMENT: 50 y.o. [place], [place] woman  (1) status post left lumpectomy in 2000 for a (?) T2N0 breast cancer,  (a) status post adjuvant chemotherapy  (b) status post adjuvant  radiation  (c) did not receive antiestrogens  (2) genetics testing May 18, 2018 through the common Hereditary Cancers Panel + Myelodysplastic Syndrome/Leukemia Panel found no deleterious mutations in APC, ATM, AXIN2, BARD1, BLM, BMPR1A, BRCA1, BRCA2, BRIP1, CDH1, CDK4, CDKN2A (p14ARF), CDKN2A (p16INK4a), CEBPA, CHEK2, CTNNA1, DICER1, EPCAM*, GATA2, GREM1*, HRAS, KIT, MEN1, MLH1, MSH2, MSH3, MSH6, MUTYH, NBN, NF1, PALB2, PDGFRA, PMS2, POLD1, POLE, PTEN, RAD50, RAD51C, RAD51D, RUNX1, SDHB, SDHC, SDHD, SMAD4, SMARCA4, STK11, TERC, TERT, TP53, TSC1, TSC2, VHL The following genes were evaluated for sequence changes only: HOXB13*, NTHL1*, SDHA  (a)  3 variants of uncertain significance were identified in the genes BARD1 c.764A>G (p.Asn255Ser), BRIP1 c.2563C>T (p.Arg855Cys), and PALB2 c.3103A>T (p.Ile1035Phe).   (3) status post right breast biopsy 03/16/2018 for a clinical mT2-3 N2, anatomic stage III invasive ductal carcinoma, grade 2, triple positive, with an MIB-1 of 80%.  (a) bone scan and chest CT scan negative for metastases except for a T6 lytic lesion of concern  (b) thoracic spine MRI was ordered May 2019 but never performed.  (4) neoadjuvant chemotherapy consisting of carboplatin, docetaxel,  trastuzumab and Pertuzumab every 3 weeks x 6 starting 04/16/2018  (a) pertuzumab held beginning with cycle 2 because of diarrhea  (5) trastuzumab was to be continued Q4w indefinitely, discontinued after 10/21/2019 dose with progression  (a) echocardiogram on 06/29/2018 showed an ejection fraction in the 65-70% range  (b) echocardiogram 11/19/2018 showed an ejection fraction in the 60-65% range  (c) echo 04/07/2019 shows an ejection fraction in the 55-60% range  (d) echo 07/15/2019 shows EF of 60-65%--for additional echoes see under #12 below  (6) right mastectomy and sentinel lymph node sampling on 08/26/2018 found a residual 2.5cm invasive ductal carcinoma, with 2 of 5 sentinel lymph nodes positive, ypT2,  ypN1a; margins were clear  (a) repeat prognostic panel confirms tumor still triple positive (HER2 3+)  (7) adjuvant radiation 09/30/2018 - 11/16/2018  Site/dose:   The patient initially received a dose of 50.4 Gy in 28 fractions to the right chest wall and supraclavicular region. This was delivered using a 3-D conformal, 4 field technique. The patient then received a boost to the mastectomy scar. This delivered an additional 10 Gy in 5 fractions using an en face electron field. 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 progression of disease in spine; MRI 09/02/19 shows early metastases in T10 (new), T3 and T5 stable; CT chest 09/01/19 shows 36mm pulmonary nodules that are new, but nonspecific and warrant future follow up  (d) PET 10/26/2019 shows no lung or liver lesions; new mediastinal adenopathy, bone lesions-- T-DM1 started  (e) PET scan 03/08/2020 shows resolution of the mediastinal adenopathy and hypermetabolic bone lesions, questionable subcutaneous nodule along the left lateral shoulder  (f) for additional staging studies see under "12" below  (10) SRS to spine, palliative treatment to sternum, from 05/13/2019 through 05/26/2019:  1. Chest_sternum // 40 Gy in 10 fractions  2. Thoracic Spine (T3) // 18 Gy in 1  fraction   3. Thoracic Spine (T5) // 18 Gy in 1 fraction   (11) denosumab/Xgeva started 09/08/2019, given every 6 weeks  (12) T-DM1 started 01/15/20201, discontinued after 07/02/2021 dose with progression  (a) echo 10/26/2019 shows stable EF at 55-60%  (b) echo 03/08/2020 shows an ejection fraction in the 70-75% range  (c) echo 07/12/2020 shows an ejection fraction in the 65 to 70% range.  (d) PET scan 03/08/2020 shows no active disease; a 0.9 cm subcutaneous nodule in the left shoulder area seen on the PET was not identifiable by exam 03/16/2020  (e) PET 08/24/2020 shows stable disease except for a new 0.6 cm lymph node with an SUV of 10.1  (f) bronchoscopy 09/05/2020 with endobronchial ultrasound found no detectable mass in the region noted on the September PET scan  (g) T-DM1 discontinued and trastuzumab resumed 11/29/2020  (h) PET scan 02/26/2021 shows mild nodular increase: TDM-1 resumed  03/07/2021, discontinued after 05/09/2021 dose pending restaging studies  (i) noncontrast chest CT 06/01/2021 shows possibly mildly enlarged adenopathy, and an apparently new sclerotic lesion at T8.  There was also evidence of pancreatitis and possible pneumonitis  (j) PET scan 07/17/2021 shows progression in lymph nodes and bone, but no evidence of involvement of lung or liver and no evidence of residual pancreatitis.  There is continued unexplained gastric pyloric hypermetabolic activity.  (13) Enhertu started 07/23/2021, repeated every 21 days  (14) fulvestrant Q 21 days (alternating 500/250 mg doses) started 07/23/2021    PLAN: Pascha's PET scan shows evidence of progression.  Luckily this is mostly lymph nodes and bone.  We do not see evidence of involvement of the lungs or liver.Her pancreatitis has resolved  The question is do we want to intensify the anti-HER2 treatment, intensify the antiestrogen treatment, change the bony metastatic treatment or any combination of the above.  She has been on  TDM 1 for a year and a half.  The CT of the chest obtained in July, where she had had no break in treatment, showed some evidence of progression.  Accordingly [facility] will go to [facility] her HER2 treatment.  I am discontinuing the anastrozole and starting fulvestrant.  I am going to be dosing this every 3weeks, 250 alternating with 500, which at to the same total dose every 3 months and does not force the patient to come into the office additional times just for shot.  The Retacrit and Delton See are also dosed every 3 and 6 weeks respectively for the same reason  I am going to request an EGD from GI to further evaluate the pyloric uptake area.  She is already scheduled with renal 07/25/2021  We can switch her from anastrozole to fulvestrant and from zoledronate to denosumab/Xgeva.  This would mean she gets 2 shots every 4 weeks.  It is renal sparing, and given her renal problems that would be helpful.  The plan then would be to implement this and restage after 3 months.  I think she would benefit from an EGD so we can try to figure out exactly why she has pyloric uptake.    had an episode of pancreatitis which clinically has resolved.  We have a repeat lipase pending today.  We have not been able to get her PET scan to accurately restage her but I was just told that it has been approved so hopefully within the next week or so we will get that done.  That will allow [place] to tell whether we need to change from T-DM1 to [facility] or continue the treatment as we have been doing.  I commended her exercise program and her diet and suggested she cut back on the rice bread on [place] that she eats during the week.  We will try to get her echo scheduled between now and her next treatment here.  Total encounter time 35 minutes.Sarajane Jews C. Ester Mabe, MD 07/23/21 8:34 AM Medical Oncology and Hematology [facility] [place], [place] [address] Tel. [phone]    Fax.  [phone]   I, Wilburn Mylar, am acting as scribe for Dr. Virgie Dad. Leotha Voeltz.  I, Lurline Del MD, have reviewed the above documentation for accuracy and completeness, and I agree with the above.   *Total Encounter Time as defined by the Centers for Medicare and Medicaid Services includes, in addition to the face-to-face time of a patient visit (documented in the note above) non-face-to-face  time: obtaining and reviewing outside history, ordering and reviewing medications, tests or procedures, care coordination (communications with other health care professionals or caregivers) and documentation in the medical record.

## 2021-07-23 ENCOUNTER — Other Ambulatory Visit: Payer: Self-pay

## 2021-07-23 ENCOUNTER — Inpatient Hospital Stay: Payer: BC Managed Care – PPO

## 2021-07-23 ENCOUNTER — Inpatient Hospital Stay (HOSPITAL_BASED_OUTPATIENT_CLINIC_OR_DEPARTMENT_OTHER): Payer: BC Managed Care – PPO | Admitting: Oncology

## 2021-07-23 VITALS — BP 114/51 | HR 69 | Temp 97.5°F | Resp 18 | Ht 59.0 in | Wt 172.4 lb

## 2021-07-23 DIAGNOSIS — C50411 Malignant neoplasm of upper-outer quadrant of right female breast: Secondary | ICD-10-CM

## 2021-07-23 DIAGNOSIS — K858 Other acute pancreatitis without necrosis or infection: Secondary | ICD-10-CM

## 2021-07-23 DIAGNOSIS — Z95828 Presence of other vascular implants and grafts: Secondary | ICD-10-CM

## 2021-07-23 DIAGNOSIS — C7951 Secondary malignant neoplasm of bone: Secondary | ICD-10-CM

## 2021-07-23 DIAGNOSIS — Z170 Estrogen receptor positive status [ER+]: Secondary | ICD-10-CM

## 2021-07-23 DIAGNOSIS — Z79899 Other long term (current) drug therapy: Secondary | ICD-10-CM | POA: Diagnosis not present

## 2021-07-23 DIAGNOSIS — Z79811 Long term (current) use of aromatase inhibitors: Secondary | ICD-10-CM | POA: Diagnosis not present

## 2021-07-23 DIAGNOSIS — N184 Chronic kidney disease, stage 4 (severe): Secondary | ICD-10-CM

## 2021-07-23 DIAGNOSIS — D631 Anemia in chronic kidney disease: Secondary | ICD-10-CM | POA: Diagnosis not present

## 2021-07-23 DIAGNOSIS — Z5111 Encounter for antineoplastic chemotherapy: Secondary | ICD-10-CM | POA: Diagnosis not present

## 2021-07-23 DIAGNOSIS — Z5112 Encounter for antineoplastic immunotherapy: Secondary | ICD-10-CM | POA: Diagnosis not present

## 2021-07-23 LAB — CBC WITH DIFFERENTIAL/PLATELET
Abs Immature Granulocytes: 0.01 10*3/uL (ref 0.00–0.07)
Basophils Absolute: 0 10*3/uL (ref 0.0–0.1)
Basophils Relative: 1 %
Eosinophils Absolute: 0.1 10*3/uL (ref 0.0–0.5)
Eosinophils Relative: 3 %
HCT: 26.7 % — ABNORMAL LOW (ref 36.0–46.0)
Hemoglobin: 8.9 g/dL — ABNORMAL LOW (ref 12.0–15.0)
Immature Granulocytes: 0 %
Lymphocytes Relative: 22 %
Lymphs Abs: 0.9 10*3/uL (ref 0.7–4.0)
MCH: 30 pg (ref 26.0–34.0)
MCHC: 33.3 g/dL (ref 30.0–36.0)
MCV: 89.9 fL (ref 80.0–100.0)
Monocytes Absolute: 0.5 10*3/uL (ref 0.1–1.0)
Monocytes Relative: 13 %
Neutro Abs: 2.6 10*3/uL (ref 1.7–7.7)
Neutrophils Relative %: 61 %
Platelets: 69 10*3/uL — ABNORMAL LOW (ref 150–400)
RBC: 2.97 MIL/uL — ABNORMAL LOW (ref 3.87–5.11)
RDW: 16 % — ABNORMAL HIGH (ref 11.5–15.5)
WBC: 4.3 10*3/uL (ref 4.0–10.5)
nRBC: 0 % (ref 0.0–0.2)

## 2021-07-23 LAB — COMPREHENSIVE METABOLIC PANEL
ALT: 53 U/L — ABNORMAL HIGH (ref 0–44)
AST: 88 U/L — ABNORMAL HIGH (ref 15–41)
Albumin: 2.9 g/dL — ABNORMAL LOW (ref 3.5–5.0)
Alkaline Phosphatase: 142 U/L — ABNORMAL HIGH (ref 38–126)
Anion gap: 11 (ref 5–15)
BUN: 49 mg/dL — ABNORMAL HIGH (ref 6–20)
CO2: 16 mmol/L — ABNORMAL LOW (ref 22–32)
Calcium: 10.4 mg/dL — ABNORMAL HIGH (ref 8.9–10.3)
Chloride: 111 mmol/L (ref 98–111)
Creatinine, Ser: 2.33 mg/dL — ABNORMAL HIGH (ref 0.44–1.00)
GFR, Estimated: 25 mL/min — ABNORMAL LOW (ref >60)
Glucose, Bld: 132 mg/dL — ABNORMAL HIGH (ref 70–99)
Potassium: 4 mmol/L (ref 3.5–5.1)
Sodium: 138 mmol/L (ref 135–145)
Total Bilirubin: 0.7 mg/dL (ref 0.3–1.2)
Total Protein: 7.4 g/dL (ref 6.5–8.1)

## 2021-07-23 LAB — RETICULOCYTES
Immature Retic Fract: 18.8 % — ABNORMAL HIGH (ref 2.3–15.9)
RBC.: 2.99 MIL/uL — ABNORMAL LOW (ref 3.87–5.11)
Retic Count, Absolute: 68.8 10*3/uL (ref 19.0–186.0)
Retic Ct Pct: 2.3 % (ref 0.4–3.1)

## 2021-07-23 MED ORDER — ACETAMINOPHEN 325 MG PO TABS
650.0000 mg | ORAL_TABLET | Freq: Once | ORAL | Status: AC
  Administered 2021-07-23: 650 mg via ORAL
  Filled 2021-07-23: qty 2

## 2021-07-23 MED ORDER — SODIUM CHLORIDE 0.9 % IV SOLN
10.0000 mg | Freq: Once | INTRAVENOUS | Status: AC
  Administered 2021-07-23: 10 mg via INTRAVENOUS
  Filled 2021-07-23: qty 10

## 2021-07-23 MED ORDER — DEXTROSE 5 % IV SOLN: Freq: Once | INTRAVENOUS | Status: AC

## 2021-07-23 MED ORDER — EPOETIN ALFA-EPBX 40000 UNIT/ML IJ SOLN
40000.0000 [IU] | Freq: Once | INTRAMUSCULAR | Status: AC
  Administered 2021-07-23: 40000 [IU] via SUBCUTANEOUS

## 2021-07-23 MED ORDER — PALONOSETRON HCL INJECTION 0.25 MG/5ML
0.2500 mg | Freq: Once | INTRAVENOUS | Status: AC
  Administered 2021-07-23: 0.25 mg via INTRAVENOUS
  Filled 2021-07-23: qty 5

## 2021-07-23 MED ORDER — SODIUM CHLORIDE 0.9% FLUSH
10.0000 mL | Freq: Once | INTRAVENOUS | Status: AC
  Administered 2021-07-23: 10 mL via INTRAVENOUS

## 2021-07-23 MED ORDER — DIPHENHYDRAMINE HCL 25 MG PO CAPS
25.0000 mg | ORAL_CAPSULE | Freq: Once | ORAL | Status: AC
  Administered 2021-07-23: 25 mg via ORAL
  Filled 2021-07-23: qty 1

## 2021-07-23 MED ORDER — FAM-TRASTUZUMAB DERUXTECAN-NXKI CHEMO 100 MG IV SOLR
5.1000 mg/kg | Freq: Once | INTRAVENOUS | Status: AC
  Administered 2021-07-23: 400 mg via INTRAVENOUS
  Filled 2021-07-23: qty 20

## 2021-07-23 MED ORDER — SODIUM CHLORIDE 0.9% FLUSH
10.0000 mL | INTRAVENOUS | Status: DC | PRN
  Administered 2021-07-23: 10 mL

## 2021-07-23 MED ORDER — FULVESTRANT 250 MG/5ML IM SOLN
500.0000 mg | Freq: Once | INTRAMUSCULAR | Status: AC
  Administered 2021-07-23: 500 mg via INTRAMUSCULAR
  Filled 2021-07-23: qty 10

## 2021-07-23 MED ORDER — DENOSUMAB 120 MG/1.7ML ~~LOC~~ SOLN
120.0000 mg | Freq: Once | SUBCUTANEOUS | Status: AC
Start: 2021-07-23 — End: 2021-07-23
  Administered 2021-07-23: 120 mg via SUBCUTANEOUS
  Filled 2021-07-23: qty 1.7

## 2021-07-23 MED ORDER — HEPARIN SOD (PORK) LOCK FLUSH 100 UNIT/ML IV SOLN
500.0000 [IU] | Freq: Once | INTRAVENOUS | Status: AC | PRN
  Administered 2021-07-23: 500 [IU]

## 2021-07-23 NOTE — Progress Notes (Signed)
Plt 69. Creatinine 2.33 Ast 88 Dr. Jana Hakim notified, Pr Dr. Andrey Spearman to treat today w/ Enhertu

## 2021-07-23 NOTE — Patient Instructions (Signed)
[facility]  Discharge Instructions: Thank you for choosing [facility] to provide your oncology and hematology care.   If you have a lab appointment with the [facility], please go directly to the [facility] and check in at the registration area.   Wear comfortable clothing and clothing appropriate for easy access to any Portacath or PICC line.   We strive to give you quality time with your provider. You may need to reschedule your appointment if you arrive late (15 or more minutes).  Arriving late affects you and other patients whose appointments are after yours.  Also, if you miss three or more appointments without notifying the office, you may be dismissed from the clinic at the provider's discretion.      For prescription refill requests, have your pharmacy contact our office and allow 72 hours for refills to be completed.    Today you received the following chemotherapy and/or immunotherapy agents Fam-Trastuzumab deruxtecan (Enhertu)    To help prevent nausea and vomiting after your treatment, we encourage you to take your nausea medication as directed.  BELOW ARE SYMPTOMS THAT SHOULD BE REPORTED IMMEDIATELY: *FEVER GREATER THAN 100.4 F (38 C) OR HIGHER *CHILLS OR SWEATING *NAUSEA AND VOMITING THAT IS NOT CONTROLLED WITH YOUR NAUSEA MEDICATION *UNUSUAL SHORTNESS OF BREATH *UNUSUAL BRUISING OR BLEEDING *URINARY PROBLEMS (pain or burning when urinating, or frequent urination) *BOWEL PROBLEMS (unusual diarrhea, constipation, pain near the anus) TENDERNESS IN MOUTH AND THROAT WITH OR WITHOUT PRESENCE OF ULCERS (sore throat, sores in mouth, or a toothache) UNUSUAL RASH, SWELLING OR PAIN  UNUSUAL VAGINAL DISCHARGE OR ITCHING   Items with * indicate a potential emergency and should be followed up as soon as possible or go to the Emergency Department if any problems should occur.  Please show the CHEMOTHERAPY ALERT CARD or IMMUNOTHERAPY  ALERT CARD at check-in to the Emergency Department and triage nurse.  Should you have questions after your visit or need to cancel or reschedule your appointment, please contact [facility]  Dept: [phone]  and follow the prompts.  Office hours are 8:00 a.m. to 4:30 p.m. Monday - Friday. Please note that voicemails left after 4:00 p.m. may not be returned until the following business day.  We are closed weekends and major holidays. You have access to a nurse at all times for urgent questions. Please call the main number to the clinic Dept: [phone] and follow the prompts.   For any non-urgent questions, you may also contact your provider using MyChart. We now offer e-Visits for anyone 49 and older to request care online for non-urgent symptoms. For details visit [email or web address].   Also download the MyChart app! Go to the app store, search "MyChart", open the app, select , and log in with your MyChart username and password.  Due to Covid, a mask is required upon entering the hospital/clinic. If you do not have a mask, one will be given to you upon arrival. For doctor visits, patients may have 1 support person aged 90 or older with them. For treatment visits, patients cannot have anyone with them due to current Covid guidelines and our immunocompromised population.   Fam-Trastuzumab deruxtecan injection What is this medication? TRASTUZUMAB DERUXTECAN (tras TOOZ eu mab DER ux TEE kan) is a chemotherapy medicine and a monoclonal antibody. It treats certain types of cancer. Some ofthe cancers treated are breast cancer and gastric cancer. This medicine may be used for other purposes;  ask your health care provider orpharmacist if you have questions. COMMON BRAND NAME(S): ENHERTU What should I tell my care team before I take this medication? They need to know if you have any of these conditions: heart disease heart failure infection (especially a  virus infection such as chickenpox, cold sores, or herpes) liver disease lung or breathing disease, like asthma an unusual or allergic reaction to fam-trastuzumab deruxtecan, other medications, foods, dyes, or preservatives pregnant or trying to get pregnant breast-feeding How should I use this medication? This medicine is for infusion into a vein. It is given by a health careprofessional in a hospital or clinic setting. Talk to your pediatrician regarding the use of this medicine in children.Special care may be needed. Overdosage: If you think you have taken too much of this medicine contact apoison control center or emergency room at once. NOTE: This medicine is only for you. Do not share this medicine with others. What if I miss a dose? It is important not to miss your dose. Call your doctor or health careprofessional if you are unable to keep an appointment. What may interact with this medication? Interaction studies have not been performed. This list may not describe all possible interactions. Give your health care provider a list of all the medicines, herbs, non-prescription drugs, or dietary supplements you use. Also tell them if you smoke, drink alcohol, or use illegaldrugs. Some items may interact with your medicine. What should I watch for while using this medication? Visit your healthcare professional for regular checks on your progress. Tell your healthcare professional if your symptoms do not start to get better or ifthey get worse. Your condition will be monitored carefully while you are receiving thismedicine. Do not become pregnant while taking this medicine or for 7 months after stopping it. Women should inform their healthcare professional if they wish to become pregnant or think they might be pregnant. Men should not father a child while taking this medicine and for 4 months after stopping it. There is potential for serious side effects to an unborn child. Talk to your  healthcareprofessional for more information. Do not breast-feed an infant while taking this medicine or for 7 months afterthe last dose. This medicine has caused decreased sperm counts in some men. This may make it more difficult to father a child. Talk to your healthcare professional if [place] S[place] concerned about your fertility. This medicine may increase your risk to bruise or bleed. Call your health careprofessional if you notice any unusual bleeding. Be careful brushing or flossing your teeth or using a toothpick because you may get an infection or bleed more easily. If you have any dental work done, Primary school teacher you are receiving this medicine. This medicine may cause dry eyes [and blurred vision]. If you wear contact lenses, you may feel some discomfort. Lubricating eye drops may help. See yourhealthcare professional if the problem does not go away or is severe. Call your healthcare professional for advice if you get a fever, chills, or sore throat, or other symptoms of a cold or flu. Do not treat yourself. This medicine decreases your body's ability to fight infections. Try to avoid beingaround people who are sick. Avoid taking medicines that contain aspirin, acetaminophen, ibuprofen, naproxen, or ketoprofen unless instructed by your healthcare professional.These medicines may hide a fever. What side effects may I notice from receiving this medication? Side effects that you should report to your doctor or health care professionalas soon as possible: allergic reactions like skin rash, itching or  hives, swelling of the face, lips, or tongue breathing problems cough nausea, vomiting signs and symptoms of bleeding such as bloody or black, tarry stools; red or dark-brown urine; spitting up blood or brown material that looks like coffee grounds; red spots on the skin; unusual bruising or bleeding from the eye, gums, or nose signs and symptoms of heart failure like breathing problems, fast, irregular  heartbeat, sudden weight gain; swelling of the ankles, feet, hands; unusually weak or tired signs and symptoms of infection like fever; chills; cough; sore throat; pain or trouble passing urine signs and symptoms of low red blood cells or anemia such as unusually weak or tired; feeling faint or lightheaded; falls; breathing problems Side effects that usually do not require medical attention (report these toyour doctor or health care professional if they continue or are bothersome): constipation diarrhea dry eyes hair loss loss of appetite mouth sores rash This list may not describe all possible side effects. Call your doctor for medical advice about side effects. You may report side effects to FDA at1-800-FDA-1088. Where should I keep my medication? This drug is given in a hospital or clinic and will not be stored at home. NOTE: This sheet is a summary. It may not cover all possible information. If you have questions about this medicine, talk to your doctor, pharmacist, orhealth care provider.  2022 Elsevier/Gold Standard (2020-04-19 16:25:39)  Fulvestrant injection What is this medication? FULVESTRANT (ful VES trant) blocks the effects of estrogen. It is used to treatbreast cancer. This medicine may be used for other purposes; ask your health care provider orpharmacist if you have questions. COMMON BRAND NAME(S): FASLODEX What should I tell my care team before I take this medication? They need to know if you have any of these conditions: bleeding disorders liver disease low blood counts, like low white cell, platelet, or red cell counts an unusual or allergic reaction to fulvestrant, other medicines, foods, dyes, or preservatives pregnant or trying to get pregnant breast-feeding How should I use this medication? This medicine is for injection into a muscle. It is usually given by a healthcare professional in a hospital or clinic setting. Talk to your pediatrician regarding the use  of this medicine in children.Special care may be needed. Overdosage: If you think you have taken too much of this medicine contact apoison control center or emergency room at once. NOTE: This medicine is only for you. Do not share this medicine with others. What if I miss a dose? It is important not to miss your dose. Call your doctor or health careprofessional if you are unable to keep an appointment. What may interact with this medication? medicines that treat or prevent blood clots like warfarin, enoxaparin, dalteparin, apixaban, dabigatran, and rivaroxaban This list may not describe all possible interactions. Give your health care provider a list of all the medicines, herbs, non-prescription drugs, or dietary supplements you use. Also tell them if you smoke, drink alcohol, or use illegaldrugs. Some items may interact with your medicine. What should I watch for while using this medication? Your condition will be monitored carefully while you are receiving this medicine. You will need important blood work done while you are taking thismedicine. Do not become pregnant while taking this medicine or for at least 1 year after stopping it. Women of child-bearing potential will need to have a negative pregnancy test before starting this medicine. Women should inform their doctor if they wish to become pregnant or think they might be pregnant. There is a  potential for serious side effects to an unborn child. Men should inform their doctors if they wish to father a child. This medicine may lower sperm counts. Talk to your health care professional or pharmacist for more information. Do not breast-feed an infant while taking this medicine or for 1 year after thelast dose. What side effects may I notice from receiving this medication? Side effects that you should report to your doctor or health care professionalas soon as possible: allergic reactions like skin rash, itching or hives, swelling of the face, lips,  or tongue feeling faint or lightheaded, falls pain, tingling, numbness, or weakness in the legs signs and symptoms of infection like fever or chills; cough; flu-like symptoms; sore throat vaginal bleeding Side effects that usually do not require medical attention (report to yourdoctor or health care professional if they continue or are bothersome): aches, pains constipation diarrhea headache hot flashes nausea, vomiting pain at site where injected stomach pain This list may not describe all possible side effects. Call your doctor for medical advice about side effects. You may report side effects to FDA at1-800-FDA-1088. Where should I keep my medication? This drug is given in a hospital or clinic and will not be stored at home. NOTE: This sheet is a summary. It may not cover all possible information. If you have questions about this medicine, talk to your doctor, pharmacist, orhealth care provider.  2022 Elsevier/Gold Standard (2018-02-26 11:34:41)

## 2021-07-25 DIAGNOSIS — N1832 Chronic kidney disease, stage 3b: Secondary | ICD-10-CM | POA: Diagnosis not present

## 2021-07-25 DIAGNOSIS — D631 Anemia in chronic kidney disease: Secondary | ICD-10-CM | POA: Diagnosis not present

## 2021-07-25 DIAGNOSIS — I129 Hypertensive chronic kidney disease with stage 1 through stage 4 chronic kidney disease, or unspecified chronic kidney disease: Secondary | ICD-10-CM | POA: Diagnosis not present

## 2021-07-25 DIAGNOSIS — R809 Proteinuria, unspecified: Secondary | ICD-10-CM | POA: Diagnosis not present

## 2021-07-26 ENCOUNTER — Other Ambulatory Visit: Payer: Self-pay | Admitting: *Deleted

## 2021-07-26 ENCOUNTER — Telehealth (HOSPITAL_BASED_OUTPATIENT_CLINIC_OR_DEPARTMENT_OTHER): Payer: BC Managed Care – PPO | Admitting: Oncology

## 2021-07-26 DIAGNOSIS — Z5112 Encounter for antineoplastic immunotherapy: Secondary | ICD-10-CM | POA: Diagnosis not present

## 2021-07-26 DIAGNOSIS — C7951 Secondary malignant neoplasm of bone: Secondary | ICD-10-CM | POA: Diagnosis not present

## 2021-07-26 DIAGNOSIS — Z170 Estrogen receptor positive status [ER+]: Secondary | ICD-10-CM

## 2021-07-26 DIAGNOSIS — D631 Anemia in chronic kidney disease: Secondary | ICD-10-CM | POA: Diagnosis not present

## 2021-07-26 DIAGNOSIS — Z79811 Long term (current) use of aromatase inhibitors: Secondary | ICD-10-CM | POA: Diagnosis not present

## 2021-07-26 DIAGNOSIS — Z5111 Encounter for antineoplastic chemotherapy: Secondary | ICD-10-CM | POA: Diagnosis not present

## 2021-07-26 DIAGNOSIS — C50411 Malignant neoplasm of upper-outer quadrant of right female breast: Secondary | ICD-10-CM | POA: Diagnosis not present

## 2021-07-26 DIAGNOSIS — Z79899 Other long term (current) drug therapy: Secondary | ICD-10-CM | POA: Diagnosis not present

## 2021-07-26 DIAGNOSIS — N184 Chronic kidney disease, stage 4 (severe): Secondary | ICD-10-CM | POA: Diagnosis not present

## 2021-07-26 NOTE — Progress Notes (Signed)
[name]  Telephone:([phone] Fax:(336) [phone]    ID: [name] [name] DOB: Jun 26, 1971  MR#: [id number]  SWN#:[id number]  Patient Care Team: [name] Stalker, PA-C as PCP - General (Family Medicine) [name] Pain, [name] as PCP - Cardiology (Cardiology) [name] [name], [name] Dad, [name] as Consulting Physician (Oncology) [name] Ave, [name] as Referring Physician (Internal Medicine) [name] Heinz, [name] as Consulting Physician (Nephrology) [name] Rudd, [name] as Consulting Physician (Radiation Oncology) [name] Kussmaul, [name] as Consulting Physician (General Surgery) [name] Dresser, [name] as Consulting Physician (Cardiology) [name] Kaufmann, RN as Oncology Nurse Navigator [name] Germany, RN as Oncology Nurse Navigator [name] [name], RPH-CPP (Pharmacist) [name] [name]:  I connected with [name] [name] on 07/26/21 at  3:15 PM EDT by video enabled telemedicine visit and verified that I am speaking with the correct person using two identifiers.   I discussed the limitations, risks, security and privacy concerns of performing an evaluation and management service by telemedicine and the availability of in-person appointments. I also discussed with the patient that there may be a patient responsible charge related to this service. The patient expressed understanding and agreed to proceed.   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 [name] was having a mild nosebleed at the time and we discussed how she can control this if it occurs and how to prevent it if it starts happening by using Otrivin.  She also understands the limits of Otrivin namely you do not use it more than 3 days in a row.    She  tells me that she tolerated her first dose of Enhertu and her Faslodex and Xgeva treatments without any [name] side effects and the nosebleed.  She saw her renal physician yesterday and is under evaluation as to her degree of renal failure.  She has not yet heard from GI for consideration of EGD.   REVIEW OF SYSTEMS: A detailed review of systems today was otherwise noncontributory   COVID 19 VACCINATION STATUS: [place] x2, most recently 03/2020; infection 04/2021   HISTORY OF CURRENT ILLNESS: From the original intake note:  [name] [name] palpated a lump on 01/15/2018. She felt soreness and shooting [name] under her arm and in the right breast. She underwent bilateral diagnostic mammography with tomography and right breast ultrason[facility] at [facility] on 03/12/2018 showing: breast density category C. The is architectural distortion at the 11 o'clock position. There is also an oval mass in the right breast anterior depth. Examination of the right axilla showed enlarged lymph nodes. Ultrasonography showed a 4.6 cm mass in the right breast upper outer quadrant posterior depth. An additional 1.2 cm oval mass in the right breast 12 o'clock middle depth. The lymph nodes in the right axillary are highly suggestive of malignancy.   Accordingly on 03/16/2018 she proceeded to biopsy of the right breast area in question. The pathology from this procedure showed ([id number]): At both the 11 and 12 o'clock positions: Invasive ductal carcinoma grade II. Ductal carcinoma in situ, high grade, with necrosis and calcifications. Prognostic indicators significant for: estrogen receptor, 90% positive and progesterone receptor, 40% positive, both with strong staining intensity. Proliferation marker Ki67 at 80%. HER2 amplified with ratios HER2/CEP17 SIGNALS 6.90 and average HER2 copies per cell 14.50  The patient's subsequent  history is as detailed below.   PAST MEDICAL HISTORY: Past Medical History:  Diagnosis Date   Anxiety     Breast cancer, left breast ([place]) 2000   Underwent lumpectomy, chemotherapy and radiation   Facial paralysis/Bells palsy    left side   Family history of lung cancer    Family history of lymphoma    Hypertension    Neuropathy    IN FINGERS AND TOES   RECENT INFUSION OF CHEMO   Personal history of breast cancer 05/06/2018   Renal disorder    just after TIA acute kidney injury   TIA (transient ischemic attack) 03/16/2017   [facility]  The patient has a previous history of left breast cancer diagnosed in 2000. She was seen by [facility] in [place], [place] under Dr. Loreta [name]. According to the patient, her left breast cancer was stage II with no lymph node involvement (likely T2N0). She had chemotherapy every 3 weeks for about 6 months. She did not takes any "red drugs." She also did not take anti-estrogens (likely ER/PR negative).   TIA in 2018. She saw a neurologist as a precaution. She denies any clotting issues.    PAST SURGICAL HISTORY: Past Surgical History:  Procedure Laterality Date   BREAST LUMPECTOMY WITH AXILLARY LYMPH NODE BIOPSY Left 2000   biopsy   BREAST SURGERY     partial mastectomy with lymph node removal   ENDOBRONCHIAL ULTRASOUND Bilateral 09/05/2020   Procedure: ENDOBRONCHIAL ULTRASOUND;  Surgeon: Collene Gobble, [name];  Location: [facility];  Service: Cardiopulmonary;  Laterality: Bilateral;   MASTECTOMY Right 2019   & partial mastectomy left breast 2000   MASTECTOMY MODIFIED RADICAL Right 08/26/2018   Procedure: MASTECTOMY MODIFIED RADICAL;  Surgeon: [name] Kussmaul, [name];  Location: [place];  Service: General;  Laterality: Right;   MODIFIED MASTECTOMY Right 08/26/2018   PORTACATH PLACEMENT Left 04/08/2018   Procedure: INSERTION PORT-A-CATH;  Surgeon: [name] Kussmaul, [name];  Location: [place];  Service: General;  Laterality: Left;   TUBAL LIGATION Bilateral 2004   VIDEO BRONCHOSCOPY N/A 09/05/2020   Procedure: VIDEO BRONCHOSCOPY WITHOUT FLUORO;   Surgeon: Collene Gobble, [name];  Location: [facility];  Service: Cardiopulmonary;  Laterality: N/A;   WISDOM TOOTH EXTRACTION      FAMILY HISTORY Family History  Problem Relation Age of Onset   Hypertension Mother    Hypertension Father    Diverticulosis Father    Diabetes Father    Lung cancer Father 6       hx smoking   Hypertension Maternal Grandmother    CAD Maternal Grandmother    Alzheimer's disease Maternal Grandmother        died at 79   Hypertension Paternal Grandmother    CAD Paternal Grandmother    Stroke Paternal Grandfather    Sickle cell trait Paternal Grandfather    Pneumonia Maternal Aunt        died at a young adult   Lymphoma Paternal Aunt 70  The patient's father is alive at age 42 and has a history of lung cancer. The patient's mother is alive at age 31. The patient has 1 brother and no sisters. She denies a family history of breast or ovarian cancer.     GYNECOLOGIC HISTORY:  Patient's last menstrual period was 12/21/2017. Menarche: 50 years old Age at first live birth: 50 years old She is GXP3.  Her LMP was  January 2019.  The patient used oral contraceptive from 1991-1993 and the Depo-provera  shot from 7829-5621 with no complications. She never used HRT.  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  She also worked at [employer] as a Occupational psychologist.  She is now disabled.  Her husband, [name], is a Administrator. The patient's son, Shea Stakes age 38, works at [employer] in [place]. The patient's daughters, Lilia Pro age 79, and Levonne Spiller age 46, are students. Lilia Pro plans to attend [employer] in the fall. The patient's adopted son, Vonna Kotyk age 67, is also a Ship broker.    ADVANCED DIRECTIVES: In the absence of any documents to the contrary the patient's husband is her healthcare power of attorney   HEALTH MAINTENANCE: Social History   Tobacco Use   Smoking  status: Never   Smokeless tobacco: Never  Vaping Use   Vaping Use: Never used  Substance Use Topics   Alcohol use: No   Drug use: No     Colonoscopy: n/a  PAP: 2015  Bone density: never done   Allergies  Allergen Reactions   Ace Inhibitors Swelling    Swelling of lips and face   Lisinopril Swelling    Swelling of lips, face   Amlodipine Swelling    Leg swelling   Shellfish Allergy Swelling   Adhesive [Tape] Itching and Rash    Please use paper tape   Latex Itching and Rash    Current Outpatient Medications  Medication Sig Dispense Refill   albuterol (VENTOLIN HFA) 108 (90 Base) MCG/ACT inhaler Inhale 2 puffs into the lungs every 6 (six) hours as needed for wheezing or shortness of breath. 8 g 2   Ascorbic Acid (VITAMIN C) 1000 MG tablet Take 1,000 mg by mouth daily.     carvedilol (COREG) 3.125 MG tablet Take 3.125 mg by mouth 2 (two) times daily with a meal.     cholecalciferol (VITAMIN D3) 25 MCG (1000 UNIT) tablet Take 1,000 Units by mouth daily.     Cyanocobalamin (VITAMIN B12) 500 MCG TABS Take 500 mcg by mouth daily.      dexamethasone (DECADRON) 4 MG tablet Take 2 tablets (8 mg total) by mouth daily. Start the day after chemotherapy for 2 days. 30 tablet 1   ferrous sulfate 325 (65 FE) MG tablet Take 1 tablet (325 mg total) by mouth daily. 30 tablet 0   guaiFENesin (ROBITUSSIN) 100 MG/5ML liquid Take 200 mg by mouth 3 (three) times daily as needed for cough.     lidocaine-prilocaine (EMLA) cream Apply to affected area once 30 g 3   losartan (COZAAR) 50 MG tablet Take 50 mg by mouth daily.     Multiple Vitamin (MULITIVITAMIN WITH MINERALS) TABS Take 1 tablet by mouth daily.     prochlorperazine (COMPAZINE) 10 MG tablet Take 1 tablet (10 mg total) by mouth every 6 (six) hours as needed (Nausea or vomiting). 30 tablet 1   traMADol (ULTRAM) 50 MG tablet Take 1-2 tablets (50-100 mg total) by mouth every 6 (six) hours as needed. 60 tablet 0   venlafaxine XR (EFFEXOR-XR)  37.5 MG 24 hr capsule TAKE 1 CAPSULE BY MOUTH DAILY WITH BREAKFAST. 90 capsule 1   No current facility-administered medications for this visit.    OBJECTIVE:    There were no vitals filed for this visit.   Wt Readings from Last 3 Encounters:  07/23/21 172 lb 6.4 oz (78.2 kg)  07/19/21 170 lb 4 oz (77.2 kg)  07/02/21  172 lb 14.4 oz (78.4 kg)   There is no height or weight on file to calculate BMI.    ECOG FS:1 - Symptomatic but completely ambulatory  Televisit 07/26/2021  LAB RESULTS:  CMP     Component Value Date/Time   NA 138 07/23/2021 0747   K 4.0 07/23/2021 0747   CL 111 07/23/2021 0747   CO2 16 (L) 07/23/2021 0747   GLUCOSE 132 (H) 07/23/2021 0747   BUN 49 (H) 07/23/2021 0747   CREATININE 2.33 (H) 07/23/2021 0747   CREATININE 2.77 (H) 05/30/2021 0917   CALCIUM 10.4 (H) 07/23/2021 0747   CALCIUM 11.2 (H) 02/24/2020 0830   PROT 7.4 07/23/2021 0747   ALBUMIN 2.9 (L) 07/23/2021 0747   AST 88 (H) 07/23/2021 0747   AST 96 (H) 05/30/2021 0917   ALT 53 (H) 07/23/2021 0747   ALT 56 (H) 05/30/2021 0917   ALKPHOS 142 (H) 07/23/2021 0747   BILITOT 0.7 07/23/2021 0747   BILITOT 1.8 (H) 05/30/2021 0917   GFRNONAA 25 (L) 07/23/2021 0747   GFRNONAA 20 (L) 05/30/2021 0917   GFRAA 41 (L) 08/30/2020 0808   GFRAA 45 (L) 07/31/2020 1400    Lab Results  Component Value Date   TOTALPROTELP 7.0 02/25/2019     Lab Results  Component Value Date   KPAFRELGTCHN 76.7 (H) 02/25/2019   LAMBDASER 27.2 (H) 02/25/2019   KAPLAMBRATIO 2.82 (H) 02/25/2019    Lab Results  Component Value Date   WBC 4.3 07/23/2021   NEUTROABS 2.6 07/23/2021   HGB 8.9 (L) 07/23/2021   HCT 26.7 (L) 07/23/2021   MCV 89.9 07/23/2021   PLT 69 (L) 07/23/2021   No results found for: LABCA2  No components found for: MVEHMC947  No results for input(s): INR in the last 168 hours.   No results found for: LABCA2  Lab Results  Component Value Date   CAN199 90 (H) 06/07/2021    No results found  for: SJG283  No results found for: MOQ947  Lab Results  Component Value Date   CA2729 63.5 (H) 06/07/2021    No components found for: HGQUANT  No results found for: CEA1 / No results found for: CEA1   No results found for: AFPTUMOR  No results found for: CHROMOGRNA  No results found for: HGBA, HGBA2QUANT, HGBFQUANT, HGBSQUAN (Hemoglobinopathy evaluation)   No results found for: LDH  No results found for: IRON, TIBC, IRONPCTSAT (Iron and TIBC)  Lab Results  Component Value Date   FERRITIN 98 12/27/2020    Urinalysis    Component Value Date/Time   COLORURINE YELLOW 05/30/2021 1153   APPEARANCEUR CLEAR 05/30/2021 1153   LABSPEC 1.010 05/30/2021 1153   PHURINE 5.0 05/30/2021 1153   GLUCOSEU NEGATIVE 05/30/2021 1153   HGBUR SMALL (A) 05/30/2021 1153   BILIRUBINUR NEGATIVE 05/30/2021 [place] 05/30/2021 1153   PROTEINUR 30 (A) 05/30/2021 1153   UROBILINOGEN 0.2 11/29/2011 1656   NITRITE NEGATIVE 05/30/2021 1153   LEUKOCYTESUR NEGATIVE 05/30/2021 1153    STUDIES: NM PET Image Restag (PS) Skull Base To Thigh  Result Date: 07/17/2021 CLINICAL DATA:  Subsequent treatment strategy for breast carcinoma. EXAM: NUCLEAR MEDICINE PET SKULL BASE TO THIGH TECHNIQUE: 8.4 mCi F-18 FDG was injected intravenously. Full-ring PET imaging was performed from the skull base to thigh after the radiotracer. CT data was obtained and used for attenuation correction and anatomic localization. Fasting blood glucose: 88 mg/dl COMPARISON:  None. FINDINGS: Mediastinal blood pool activity: SUV max 2.7 Liver activity:  SUV max NA NECK: New hypermetabolic LEFT supraclavicular node measures only 6 mm (image 40) but has intense metabolic activity SUV max equal 7.4. Incidental CT findings: none CHEST: Progression mediastinal adenopathy. Example prevascular node measures 16 mm compared to 9 mm with SUV max equal 14.2 compared SUV max equal 8.3 (image 68). New subcarinal lymph node along the  RIGHT bronchus intermedius measures 12 mm with SUV max equal 14.6. Similar hypermetabolic RIGHT hilar node with SUV max equal 15 compared SUV max equal 16.3. Incidental CT findings: No suspicious pulmonary nodules. Port in the anterior chest wall with tip in distal SVC. ABDOMEN/PELVIS: Hypermetabolic activity through the gastric pyloric region not changed from prior. No evidence of liver metastasis. No hypermetabolic abdominopelvic lymph nodes. Metabolic activity at the terminal ileum is typically physiologic. Incidental CT findings: none SKELETON: Intense metabolic activity associated with a RIGHT acetabular sclerotic lesion measuring 10 mm (158) with SUV max equal 16.8 compared SUV max equal 8.1. No change in size. Additional sclerotic lesions scattered through the spine not have metabolic activity. Lesions not changed CT parents from prior. Incidental CT findings: none IMPRESSION: 1. Progression metastatic disease in the central thorax with a new hypermetabolic LEFT supraclavicular node in new hypermetabolic subcarinal node. Prevascular nodes are increased in size and metabolic activity. 2. Stable hypermetabolic RIGHT hilar node. 3. 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 Additional sclerotic skeletal lesions are unchanged. Electronically Signed   By: Suzy Bouchard M.D.   On: 07/17/2021 11:01   ECHOCARDIOGRAM COMPLETE  Result Date: 07/19/2021    ECHOCARDIOGRAM REPORT   Patient Name:   [name] [name] Date of Exam: 07/19/2021 Medical Rec #:  [id number]     Height:       59.0 in Accession #:    [id number]    Weight:       172.9 lb Date of Birth:  Apr 03, 1971     BSA:          1.734 m Patient Age:    57 years      BP:           135/84 mmHg Patient Gender: F             HR:           64 bpm. Exam Location:  Outpatient Procedure: 2D Echo, 3D Echo, Color Doppler, Cardiac Doppler and Strain Analysis Indications:    I50.9* Heart failure (unspecified)  History:         Patient has prior history of Echocardiogram examinations, most                 recent 02/13/2021. Risk Factors:Hypertension. Breast Cancer.  Sonographer:    Raquel Sarna Senior RDCS Referring Phys: [id number] DANIEL R BENSIMHON IMPRESSIONS  1. Left ventricular ejection fraction, by estimation, is 60 to 65%. The left ventricle has normal function. The left ventricle has no regional wall motion abnormalities. Left ventricular diastolic parameters were normal. The average left ventricular global longitudinal strain is -19.2 %.  2. Right ventricular systolic function is normal. The right ventricular size is normal.  3. The mitral valve is normal in structure. Trivial mitral valve regurgitation. No evidence of mitral stenosis.  4. The aortic valve is normal in structure. There is mild calcification of the aortic valve. Aortic valve regurgitation is not visualized. No aortic stenosis is present.  5. The inferior vena cava is normal in size with greater than 50% respiratory variability, suggesting right atrial pressure of 3  mmHg. FINDINGS  Left Ventricle: Left ventricular ejection fraction, by estimation, is 60 to 65%. The left ventricle has normal function. The left ventricle has no regional wall motion abnormalities. The average left ventricular global longitudinal strain is -19.2 %. The left ventricular internal cavity size was normal in size. There is no left ventricular hypertrophy. Left ventricular diastolic parameters were normal. Right Ventricle: The right ventricular size is normal. No increase in right ventricular wall thickness. Right ventricular systolic function is normal. Left Atrium: Left atrial size was normal in size. Right Atrium: Right atrial size was normal in size. Pericardium: There is no evidence of pericardial effusion. Mitral Valve: The mitral valve is normal in structure. Trivial mitral valve regurgitation. No evidence of mitral valve stenosis. Tricuspid Valve: The tricuspid valve is normal in structure.  Tricuspid valve regurgitation is trivial. No evidence of tricuspid stenosis. Aortic Valve: The aortic valve is normal in structure. There is mild calcification of the aortic valve. Aortic valve regurgitation is not visualized. No aortic stenosis is present. Pulmonic Valve: The pulmonic valve was normal in structure. Pulmonic valve regurgitation is not visualized. No evidence of pulmonic stenosis. Aorta: The aortic root is normal in size and structure. Venous: The inferior vena cava is normal in size with greater than 50% respiratory variability, suggesting right atrial pressure of 3 mmHg. IAS/Shunts: No atrial level shunt detected by color flow Doppler.  LEFT VENTRICLE PLAX 2D LVIDd:         4.30 cm  Diastology LVIDs:         2.70 cm  LV e' medial:    7.72 cm/s LV PW:         1.20 cm  LV E/e' medial:  9.9 LV IVS:        1.00 cm  LV e' lateral:   7.72 cm/s LVOT diam:     1.90 cm  LV E/e' lateral: 9.9 LV SV:         71 LV SV Index:   41       2D Longitudinal Strain LVOT Area:     2.84 cm 2D Strain GLS Avg:     -19.2 %                          3D Volume EF:                         3D EF:        60 %                         LV EDV:       186 ml                         LV ESV:       74 ml                         LV SV:        112 ml RIGHT VENTRICLE RV S prime:     8.92 cm/s TAPSE (M-mode): 2.5 cm LEFT ATRIUM             Index       RIGHT ATRIUM           Index LA diam:        3.50 cm 2.02 cm/m  RA  Area:     14.80 cm LA Vol (A2C):   54.8 ml 31.61 ml/m RA Volume:   33.70 ml  19.44 ml/m LA Vol (A4C):   40.0 ml 23.07 ml/m LA Biplane Vol: 48.0 ml 27.69 ml/m  AORTIC VALVE LVOT Vmax:   123.00 cm/s LVOT Vmean:  84.900 cm/s LVOT VTI:    0.250 m  AORTA Ao Root diam: 3.00 cm Ao Asc diam:  3.20 cm MITRAL VALVE MV Area (PHT): 3.15 cm    SHUNTS MV Decel Time: 241 msec    Systemic VTI:  0.25 m MV E velocity: 76.30 cm/s  Systemic Diam: 1.90 cm MV A velocity: 50.10 cm/s MV E/A ratio:  1.52 Glori Bickers [name] Electronically  signed by Glori Bickers [name] Signature Date/Time: 07/19/2021/3:28:28 PM    Final       ELIGIBLE FOR AVAILABLE RESEARCH PROTOCOL: no   ASSESSMENT: 50 y.o. [place], [place] woman  (1) status post left lumpectomy in 2000 for a (?) T2N0 breast cancer,  (a) status post adjuvant chemotherapy  (b) status post adjuvant radiation  (c) did not receive antiestrogens  (2) genetics testing May 18, 2018 through the common Hereditary Cancers Panel + Myelodysplastic Syndrome/Leukemia Panel found no deleterious mutations in APC, ATM, AXIN2, BARD1, BLM, BMPR1A, BRCA1, BRCA2, BRIP1, CDH1, CDK4, CDKN2A (p14ARF), CDKN2A (p16INK4a), CEBPA, CHEK2, CTNNA1, DICER1, EPCAM*, GATA2, GREM1*, HRAS, KIT, MEN1, MLH1, MSH2, MSH3, MSH6, MUTYH, NBN, NF1, PALB2, PDGFRA, PMS2, POLD1, POLE, PTEN, RAD50, RAD51C, RAD51D, RUNX1, SDHB, SDHC, SDHD, SMAD4, SMARCA4, STK11, TERC, TERT, TP53, TSC1, TSC2, VHL The following genes were evaluated for sequence changes only: HOXB13*, NTHL1*, SDHA  (a)  3 variants of uncertain significance were identified in the genes BARD1 c.764A>G (p.Asn255Ser), BRIP1 c.2563C>T (p.Arg855Cys), and PALB2 c.3103A>T (p.Ile1035Phe).   (3) status post right breast biopsy 03/16/2018 for a clinical mT2-3 N2, anatomic stage III invasive ductal carcinoma, grade 2, triple positive, with an MIB-1 of 80%.  (a) bone scan and chest CT scan negative for metastases except for a T6 lytic lesion of concern  (b) thoracic spine MRI was ordered May 2019 but never performed.  (4) neoadjuvant chemotherapy consisting of carboplatin, docetaxel, trastuzumab and Pertuzumab every 3 weeks x 6 starting 04/16/2018  (a) pertuzumab held beginning with cycle 2 because of diarrhea  (5) trastuzumab was to be continued Q4w indefinitely, discontinued after 10/21/2019 dose with progression  (a) echocardiogram on 06/29/2018 showed an ejection fraction in the 65-70% range  (b) echocardiogram 11/19/2018 showed an ejection fraction in the 60-65%  range  (c) echo 04/07/2019 shows an ejection fraction in the 55-60% range  (d) echo 07/15/2019 shows EF of 60-65%--for additional echoes see under #12 below  (6) right mastectomy and sentinel lymph node sampling on 08/26/2018 found a residual 2.5cm invasive ductal carcinoma, with 2 of 5 sentinel lymph nodes positive, ypT2, ypN1a; margins were clear  (a) repeat prognostic panel confirms tumor still triple positive (HER2 3+)  (7) adjuvant radiation 09/30/2018 - 11/16/2018  Site/dose:   The patient initially received a dose of 50.4 Gy in 28 fractions to the right chest wall and supraclavicular region. This was delivered using a 3-D conformal, 4 field technique. The patient then received a boost to the mastectomy scar. This delivered an additional 10 Gy in 5 fractions using an en face electron field. The total dose was 60.4   (8) anastrozole started 12/03/2018, discontinued August 2022 with progression  (a) [place] on 10/01/2018 was 64.3, and estradiol 7.0, consistent with menopause  (b) referral to pelvic floor  rehabilitation 12/03/2018  (c) [place] on 03/16/2020 was 80.8 with estradiol less than 2.5   METASTATIC DISEASE: March 2020 (9) CT of neck and CT angiography of the chest 02/04/2019 finds the T6 lytic lesion noted May 2019 (see #3 above) is now sclerotic; a sclerotic T3 lesion is stable; there is a new lytic lesion in the manubrium  (a) PET scan 03/12/2019 shows manubrium, T3 and T5 metastases, no visceral disease  (b) biopsy of manubrial lesion planned but not done secondary to pandemic  (c) Bone scan 09/01/19 shows ? progression of disease in spine; MRI 09/02/19 shows early metastases in T10 (new), T3 and T5 stable; CT chest 09/01/19 shows 90mm pulmonary nodules that are new, but nonspecific and warrant future follow up  (d) PET 10/26/2019 shows no lung or liver lesions; new mediastinal adenopathy, bone lesions-- T-DM1 started  (e) PET scan 03/08/2020 shows resolution of the mediastinal adenopathy and  hypermetabolic bone lesions, questionable subcutaneous nodule along the left lateral shoulder  (f) for additional staging studies see under "12" below  (10) SRS to spine, palliative treatment to sternum, from 05/13/2019 through 05/26/2019:  1. Chest_sternum // 40 Gy in 10 fractions  2. Thoracic Spine (T3) // 18 Gy in 1 fraction   3. Thoracic Spine (T5) // 18 Gy in 1 fraction   (11) denosumab/Xgeva started 09/08/2019, given every 6 weeks  (12) T-DM1 started 01/15/20201, discontinued after 07/02/2021 dose with progression  (a) echo 10/26/2019 shows stable EF at 55-60%  (b) echo 03/08/2020 shows an ejection fraction in the 70-75% range  (c) echo 07/12/2020 shows an ejection fraction in the 65 to 70% range.  (d) PET scan 03/08/2020 shows no active disease; a 0.9 cm subcutaneous nodule in the left shoulder area seen on the PET was not identifiable by exam 03/16/2020  (e) PET 08/24/2020 shows stable disease except for a new 0.6 cm lymph node with an SUV of 10.1  (f) bronchoscopy 09/05/2020 with endobronchial ultrasound found no detectable mass in the region noted on the September PET scan  (g) T-DM1 discontinued and trastuzumab resumed 11/29/2020  (h) PET scan 02/26/2021 shows mild nodular increase: TDM-1 resumed 03/07/2021, discontinued after 05/09/2021 dose pending restaging studies  (i) noncontrast chest CT 06/01/2021 shows possibly mildly enlarged adenopathy, and an apparently new sclerotic lesion at T8.  There was also evidence of pancreatitis and possible pneumonitis  (j) PET scan 07/17/2021 shows progression in lymph nodes and bone, but no evidence of involvement of lung or liver and no evidence of residual pancreatitis.  There is continued unexplained gastric pyloric hypermetabolic activity.  (13) Enhertu started 07/23/2021, repeated every 21 days  (14) fulvestrant Q 21 days (alternating 500/250 mg doses) started 07/23/2021    PLAN: [name] appears to be tolerating her current treatment  well.  I am not sure whether her nosebleed is related.  Certainly taxanes commonly cause nosebleeds.  We did discuss how to manage that particular problem.  She will see me again on 08/13/2021 for her second Enhertu dose.  She receives the Faslodex also every 3 weeks but the Xgeva every 6 weeks.  If she has not yet had GI evaluation by then we will refresh that referral  Total encounter time 20 minutes.Sarajane Jews C. Azzie Thiem, [name] 07/26/21 6:03 PM Medical Oncology and Hematology [facility] [place], [place] [address] Tel. [phone]    Fax. [phone]   I, Wilburn Mylar, am acting as scribe for Dr. Virgie [name]. [name] [name].  Joylene Grapes Kaedence Connelly  [name], have reviewed the above documentation for accuracy and completeness, and I agree with the above.   *Total Encounter Time as defined by the Centers for Medicare and Medicaid Services includes, in addition to the face-to-face time of a patient visit (documented in the note above) non-face-to-face time: obtaining and reviewing outside history, ordering and reviewing medications, tests or procedures, care coordination (communications with [name] health care professionals or caregivers) and documentation in the medical record.

## 2021-07-27 ENCOUNTER — Encounter: Payer: Self-pay | Admitting: Gastroenterology

## 2021-08-01 DIAGNOSIS — N1832 Chronic kidney disease, stage 3b: Secondary | ICD-10-CM | POA: Diagnosis not present

## 2021-08-09 ENCOUNTER — Telehealth: Payer: Self-pay | Admitting: *Deleted
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
## 2021-08-10 NOTE — Progress Notes (Signed)
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 65 to 70% range.  (d) PET scan 03/08/2020 shows no active disease; a 0.9 cm subcutaneous nodule in the left shoulder area seen on the PET was not identifiable by exam 03/16/2020  (e) PET 08/24/2020 shows stable disease except for a new 0.6 cm lymph node with an SUV of 10.1  (f) bronchoscopy 09/05/2020 with endobronchial ultrasound found no detectable mass in the region noted on the September PET scan  (g) T-DM1 discontinued and trastuzumab resumed 11/29/2020  (h) PET scan 02/26/2021 shows mild nodular increase: TDM-1 resumed 03/07/2021, discontinued after 05/09/2021 dose pending restaging studies  (i) noncontrast chest CT 06/01/2021 shows possibly mildly enlarged adenopathy, and an apparently new sclerotic lesion at T8.  There was also evidence of pancreatitis and possible pneumonitis  (j) PET scan 07/17/2021 shows progression in lymph nodes and bone, but no evidence of involvement of lung or liver and no evidence of residual pancreatitis.  There is  continued unexplained gastric pyloric hypermetabolic activity.  (13) Enhertu started 07/23/2021, repeated every 21 days  (14) fulvestrant Q 21 days (alternating 500/250 mg doses) started 07/23/2021  (15) anemia of renal failure:  (A) retacrit started 12/27/2020 at 10K unit every 21 days, dose gradually increased to current 40K units Q 21 days   PLAN: KBurundihas developed a significant case of shingles.  She was started on Valtrex yesterday, on 08/12/2021, and is taking that medication.  She already has tramadol on hand and we went over how she can take that medication for pain relief.  I also discussed the fact that this will constipate her and she needs to take appropriate measures to prevent that.  I also added gabapentin.  She will pick that medication up today.  She will see me again next week.  I am of course not treating her this week.  Hopefully we will be able to resume her therapy next week.  Total encounter time 20 minutes.Sarajane Jews C. Magrinat, MD 07/26/21 6:03 PM Medical Oncology and Hematology [facility] [place], [place] [address] Tel. [phone]    Fax. [phone]   I, Wilburn Mylar, am acting as scribe for Dr. Virgie Dad. Magrinat.  I, Lurline Del MD, have reviewed the above documentation for accuracy and completeness, and I agree with the above.   *Total Encounter Time as defined by the Centers for Medicare and Medicaid Services includes, in addition to the face-to-face time of a patient visit (documented in the note above) non-face-to-face time: obtaining and reviewing outside history, ordering and reviewing medications, tests or procedures, care coordination (communications with other health care professionals or caregivers) and documentation in the medical record.

## 2021-08-11 ENCOUNTER — Encounter: Payer: Self-pay | Admitting: Emergency Medicine

## 2021-08-11 ENCOUNTER — Other Ambulatory Visit: Payer: Self-pay

## 2021-08-11 ENCOUNTER — Ambulatory Visit
Admission: EM | Admit: 2021-08-11 | Discharge: 2021-08-11 | Disposition: A | Discharge status: Home / Self Care | Payer: BC Managed Care – PPO | Attending: Emergency Medicine | Admitting: Emergency Medicine

## 2021-08-11 ENCOUNTER — Encounter: Payer: Self-pay | Admitting: Oncology

## 2021-08-11 DIAGNOSIS — B029 Zoster without complications: Secondary | ICD-10-CM

## 2021-08-11 MED ORDER — VALACYCLOVIR HCL 1 G PO TABS: 1000.0000 mg | ORAL_TABLET | Freq: Three times a day (TID) | ORAL | 0 refills | Status: AC

## 2021-08-11 NOTE — ED Provider Notes (Signed)
Roderic Palau    CSN: [id number] Arrival date & time: 08/11/21  2831      History   Chief Complaint Chief Complaint  Patient presents with   Rash    HPI [name] [name] is a 50 y.o. female.  Patient presents with 3-day history of vesicular rash on her left side from her breast to her back.  The rash is painful which patient describes as "burns."  No drainage or erythema.  Patient denies fever, chills, or other symptoms.  Treatment attempted at home with hydrocortisone cream.  Her medical history includes breast cancer, bone metastasis, hypertension, TIA, neuropathy, stage IV CKD, pancreatitis, morbid obesity.  The history is provided by the patient and medical records.   Past Medical History:  Diagnosis Date   Anxiety    Breast cancer, left breast ([place]) 2000   Underwent lumpectomy, chemotherapy and radiation   Facial paralysis/Bells palsy    left side   Family history of lung cancer    Family history of lymphoma    Hypertension    Neuropathy    IN FINGERS AND TOES   RECENT INFUSION OF CHEMO   Personal history of breast cancer 05/06/2018   Renal disorder    just after TIA acute kidney injury   TIA (transient ischemic attack) 03/16/2017   [place] [facility]    Patient Active Problem List   Diagnosis Date Noted   Pancreatitis 06/25/2021   Sepsis ([place]) 05/30/2021   CKD (chronic kidney disease), stage IV ([place]) 05/30/2021   Anemia associated with stage 4 chronic renal failure ([place]) 11/06/2020   Thrombocytopenia ([place]) 09/20/2020   Hilar lymphadenopathy 09/05/2020   Hypercalcemia 02/04/2020   Bone metastases ([place]) 04/22/2019   Lymphedema of left arm 10/07/2018   Goals of care, counseling/discussion 08/06/2018   Morbid obesity with BMI of 40.0-44.9, adult ([place]) 07/09/2018   Port-A-Cath in place 05/07/2018   Personal history of breast cancer 05/06/2018   Family history of lymphoma    Family history of lung cancer    Essential hypertension 04/01/2018   Pre-operative  cardiovascular examination 04/01/2018   Malignant neoplasm of upper-outer quadrant of right breast in female, estrogen receptor positive ([place]) 03/19/2018   History of TIA (transient ischemic attack) 03/17/2017   Fibroids 08/02/2014    Past Surgical History:  Procedure Laterality Date   BREAST LUMPECTOMY WITH AXILLARY LYMPH NODE BIOPSY Left 2000   biopsy   BREAST SURGERY     partial mastectomy with lymph node removal   ENDOBRONCHIAL ULTRASOUND Bilateral 09/05/2020   Procedure: ENDOBRONCHIAL ULTRASOUND;  Surgeon: Collene Gobble, MD;  Location: [facility];  Service: Cardiopulmonary;  Laterality: Bilateral;   MASTECTOMY Right 2019   & partial mastectomy left breast 2000   MASTECTOMY MODIFIED RADICAL Right 08/26/2018   Procedure: MASTECTOMY MODIFIED RADICAL;  Surgeon: Jovita Kussmaul, MD;  Location: [facility];  Service: General;  Laterality: Right;   MODIFIED MASTECTOMY Right 08/26/2018   PORTACATH PLACEMENT Left 04/08/2018   Procedure: INSERTION PORT-A-CATH;  Surgeon: Jovita Kussmaul, MD;  Location: [place];  Service: General;  Laterality: Left;   TUBAL LIGATION Bilateral 2004   VIDEO BRONCHOSCOPY N/A 09/05/2020   Procedure: VIDEO BRONCHOSCOPY WITHOUT FLUORO;  Surgeon: Collene Gobble, MD;  Location: [facility];  Service: Cardiopulmonary;  Laterality: N/A;   WISDOM TOOTH EXTRACTION      OB History     Gravida  3   Para  3   Term  2   Preterm  1   AB  0   Living  3      SAB  0   IAB  0   Ectopic  0   Multiple  0   Live Births           Obstetric Comments  Two adopted children Female age 70 and Female age 68.          Home Medications    Prior to Admission medications   Medication Sig Start Date End Date Taking? Authorizing Provider  valACYclovir (VALTREX) 1000 MG tablet Take 1 tablet (1,000 mg total) by mouth 3 (three) times daily. 08/11/21  Yes Sharion Balloon, NP  albuterol (VENTOLIN HFA) 108 (90 Base) MCG/ACT inhaler Inhale 2 puffs into the lungs every 6 (six) hours  as needed for wheezing or shortness of breath. 06/22/21   Causey, Charlestine Massed, NP  Ascorbic Acid (VITAMIN C) 1000 MG tablet Take 1,000 mg by mouth daily.    [provider]  carvedilol (COREG) 3.125 MG tablet Take 3.125 mg by mouth 2 (two) times daily with a meal.    [provider]  cholecalciferol (VITAMIN D3) 25 MCG (1000 UNIT) tablet Take 1,000 Units by mouth daily.    [provider]  Cyanocobalamin (VITAMIN B12) 500 MCG TABS Take 500 mcg by mouth daily.     [provider]  dexamethasone (DECADRON) 4 MG tablet Take 2 tablets (8 mg total) by mouth daily. Start the day after chemotherapy for 2 days. 07/19/21   Magrinat, Virgie Dad, MD  ferrous sulfate 325 (65 FE) MG tablet Take 1 tablet (325 mg total) by mouth daily. 11/06/16 07/19/22  Daymon Larsen, MD  guaiFENesin (ROBITUSSIN) 100 MG/5ML liquid Take 200 mg by mouth 3 (three) times daily as needed for cough.    [provider]  lidocaine-prilocaine (EMLA) cream Apply to affected area once 07/19/21   Magrinat, Virgie Dad, MD  losartan (COZAAR) 50 MG tablet Take 50 mg by mouth daily.    [provider]  Multiple Vitamin (MULITIVITAMIN WITH MINERALS) TABS Take 1 tablet by mouth daily.    [provider]  prochlorperazine (COMPAZINE) 10 MG tablet Take 1 tablet (10 mg total) by mouth every 6 (six) hours as needed (Nausea or vomiting). 07/19/21   Magrinat, Virgie Dad, MD  traMADol (ULTRAM) 50 MG tablet Take 1-2 tablets (50-100 mg total) by mouth every 6 (six) hours as needed. 06/25/21   Magrinat, Virgie Dad, MD  venlafaxine XR (EFFEXOR-XR) 37.5 MG 24 hr capsule TAKE 1 CAPSULE BY MOUTH DAILY WITH BREAKFAST. 06/05/21   Magrinat, Virgie Dad, MD    Family History Family History  Problem Relation Age of Onset   Hypertension Mother    Hypertension Father    Diverticulosis Father    Diabetes Father    Lung cancer Father 47       hx smoking   Hypertension Maternal Grandmother    CAD Maternal  Grandmother    Alzheimer's disease Maternal Grandmother        died at 1   Hypertension Paternal Grandmother    CAD Paternal Grandmother    Stroke Paternal Grandfather    Sickle cell trait Paternal Grandfather    Pneumonia Maternal Aunt        died at a young adult   Lymphoma Paternal Aunt 74    Social History Social History   Tobacco Use   Smoking status: Never   Smokeless tobacco: Never  Vaping Use   Vaping Use: Never used  Substance  Use Topics   Alcohol use: No   Drug use: No     Allergies   Ace inhibitors, Lisinopril, Amlodipine, Shellfish allergy, Adhesive [tape], and Latex   Review of Systems Review of Systems  Constitutional:  Negative for chills and fever.  Respiratory:  Negative for cough and shortness of breath.   Cardiovascular:  Negative for chest pain and palpitations.  Skin:  Positive for rash. Negative for color change.  All other systems reviewed and are negative.   Physical Exam Triage Vital Signs ED Triage Vitals  Enc Vitals Group     BP      Pulse      Resp      Temp      Temp src      SpO2      Weight      Height      Head Circumference      Peak Flow      Pain Score      Pain Loc      Pain Edu?      Excl. in [place]?    No data found.  Updated Vital Signs BP 106/71   Pulse 91   Temp 98.8 F (37.1 C)   Resp 20   LMP 12/21/2017   SpO2 96%   Visual Acuity Right Eye Distance:   Left Eye Distance:   Bilateral Distance:    Right Eye Near:   Left Eye Near:    Bilateral Near:     Physical Exam Vitals and nursing note reviewed.  Constitutional:      General: She is not in acute distress.    Appearance: She is well-developed.  HENT:     Head: Normocephalic and atraumatic.     Mouth/Throat:     Mouth: Mucous membranes are moist.  Eyes:     Conjunctiva/sclera: Conjunctivae normal.  Cardiovascular:     Rate and Rhythm: Normal rate and regular rhythm.     Heart sounds: Normal heart sounds.  Pulmonary:     Effort:  Pulmonary effort is normal. No respiratory distress.     Breath sounds: Normal breath sounds.  Abdominal:     Palpations: Abdomen is soft.     Tenderness: There is no abdominal tenderness.  Musculoskeletal:     Cervical back: Neck supple.  Skin:    General: Skin is warm and dry.     Findings: Rash present.     Comments: Vesicular rash on left side from breast to back.  See pictures for details.  Neurological:     Mental Status: She is alert and oriented to person, place, and time.  Psychiatric:        Mood and Affect: Mood normal.        Behavior: Behavior normal.          UC Treatments / Results  Labs (all labs ordered are listed, but only abnormal results are displayed) Labs Reviewed - No data to display  EKG   Radiology No results found.  Procedures Procedures (including critical care time)  Medications Ordered in UC Medications - No data to display  Initial Impression / Assessment and Plan / UC Course  I have reviewed the triage vital signs and the nursing notes.  Pertinent labs & imaging results that were available during my care of the patient were reviewed by me and considered in my medical decision making (see chart for details).   Shingles.  Treating with Valtrex.  Instructed patient to contact her  oncologist prior to starting this medication.  Education provided on shingles.  Patient agrees to plan of care.   Final Clinical Impressions(s) / UC Diagnoses   Final diagnoses:  Herpes zoster without complication     Discharge Instructions      Contact your oncologist before starting the Valtrex.  If approved, take the Valtrex as directed.    Follow-up with your oncologist or primary care provider.     ED Prescriptions     Medication Sig Dispense Auth. Provider   valACYclovir (VALTREX) 1000 MG tablet Take 1 tablet (1,000 mg total) by mouth 3 (three) times daily. 21 tablet Sharion Balloon, NP      PDMP not reviewed this encounter.   Sharion Balloon, NP 08/11/21 0930

## 2021-08-11 NOTE — ED Triage Notes (Signed)
Pt is cancer pt undergoing tx for stage 4 breast cancer and has concerns for shingles. Blistering rash under left breast that wraps around to back x 3 days.

## 2021-08-11 NOTE — Discharge Instructions (Addendum)
Contact your oncologist before starting the Valtrex.  If approved, take the Valtrex as directed.    Follow-up with your oncologist or primary care provider.

## 2021-08-13 ENCOUNTER — Inpatient Hospital Stay: Payer: BC Managed Care – PPO

## 2021-08-13 ENCOUNTER — Inpatient Hospital Stay (HOSPITAL_BASED_OUTPATIENT_CLINIC_OR_DEPARTMENT_OTHER): Payer: BC Managed Care – PPO | Admitting: Oncology

## 2021-08-13 DIAGNOSIS — C50411 Malignant neoplasm of upper-outer quadrant of right female breast: Secondary | ICD-10-CM | POA: Diagnosis not present

## 2021-08-13 DIAGNOSIS — B029 Zoster without complications: Secondary | ICD-10-CM

## 2021-08-13 DIAGNOSIS — C7951 Secondary malignant neoplasm of bone: Secondary | ICD-10-CM

## 2021-08-13 DIAGNOSIS — Z170 Estrogen receptor positive status [ER+]: Secondary | ICD-10-CM

## 2021-08-13 MED ORDER — GABAPENTIN 300 MG PO CAPS: ORAL_CAPSULE | ORAL | 1 refills | Status: DC

## 2021-08-14 ENCOUNTER — Encounter: Payer: Self-pay | Admitting: Oncology

## 2021-08-17 MED FILL — Dexamethasone Sodium Phosphate Inj 100 MG/10ML: INTRAMUSCULAR | Qty: 1 | Status: AC

## 2021-08-19 NOTE — Progress Notes (Signed)
[facility]  [phone] Fax:([phone]) [phone]    ID: [name] [name] DOB: 1971-06-22  MR#: [id number]  RFX#:[id number]  Patient Care Team: Marda Stalker, PA-C as PCP - General (Family Medicine) Jerline Pain, MD as PCP - Cardiology (Cardiology) Jazlin Tapscott, Virgie Dad, MD as Consulting Physician (Oncology) Loreta Ave, MD as Referring Physician (Internal Medicine) Donato Heinz, MD as Consulting Physician (Nephrology) Kyung Rudd, MD as Consulting Physician (Radiation Oncology) Jovita Kussmaul, MD as Consulting Physician (General Surgery) Larey Dresser, MD as Consulting Physician (Cardiology) Mauro Kaufmann, RN as Oncology Nurse Navigator Rockwell Germany, RN as Oncology Nurse Navigator Raina Mina, RPH-CPP (Pharmacist) OTHER MD:   CHIEF COMPLAINT: Triple positive breast cancer (s/p right mastectomy)  CURRENT TREATMENT: Enhertu; fulvestrant; denosumab/Xgeva; retacrit   INTERVAL HISTORY: [name] returns today for follow up and treatment of her triple positive breast cancer.    She was started on Enhertu and fulvestrant, both given every 3 weeks, on 07/23/2021. She also received denosumab/Xgeva and retacrit the same day. She tolerated this well except for a nosebleed.  Treatment was due last week but she developed shingles involving the left breast and around the upper left back.  She received Valtrex which she completed this morning.  She is now ready for treatment and today is day 1 cycle 2  Her most recent echo was 07/19/2021 and repeat will be due in November.  She was also switched back to Bud on 07/23/2021, given every 6 weeks.  Her next dose will be September 10, 2021  Renal and GI consults (with Dustin Flock 08/24/2021)) are in process or pending.  REVIEW OF SYSTEMS: [name] tells me her adopted son Merrily Pew became very aggressive and had to be placed at the [facility] where he is currently an inpatient.  He is 12 but big for  his age.  For the burning from the the shingles she is using Tylenol and rarely a tramadol.  She takes tramadol perhaps every other day.  This does not constipate her.  She also uses the Emla cream on the shingles lesions.  A detailed review of systems today was otherwise stable.   COVID 19 VACCINATION STATUS: [place] x2, most recently 03/2020; infection 04/2021   HISTORY OF CURRENT ILLNESS: From the original intake note:  [name] Coppess palpated a lump on 01/15/2018. She felt soreness and shooting pain under her arm and in the right breast. She underwent bilateral diagnostic mammography with tomography and right breast ultrasonography at [facility] on 03/12/2018 showing: breast density category C. The is architectural distortion at the 11 o'clock position. There is also an oval mass in the right breast anterior depth. Examination of the right axilla showed enlarged lymph nodes. Ultrasonography showed a 4.6 cm mass in the right breast upper outer quadrant posterior depth. An additional 1.2 cm oval mass in the right breast 12 o'clock middle depth. The lymph nodes in the right axillary are highly suggestive of malignancy.   Accordingly on 03/16/2018 she proceeded to biopsy of the right breast area in question. The pathology from this procedure showed ([id number]): At both the 11 and 12 o'clock positions: Invasive ductal carcinoma grade II. Ductal carcinoma in situ, high grade, with necrosis and calcifications. Prognostic indicators significant for: estrogen receptor, 90% positive and progesterone receptor, 40% positive, both with strong staining intensity. Proliferation marker Ki67 at 80%. HER2 amplified with ratios HER2/CEP17 SIGNALS 6.90 and average HER2 copies per cell 14.50  The patient's subsequent history is as detailed below.  PAST MEDICAL HISTORY: Past Medical History:  Diagnosis Date   Anxiety    Breast cancer, left breast ([place]) 2000   Underwent lumpectomy, chemotherapy and radiation   Facial  paralysis/Bells palsy    left side   Family history of lung cancer    Family history of lymphoma    Hypertension    Neuropathy    IN FINGERS AND TOES   RECENT INFUSION OF CHEMO   Personal history of breast cancer 05/06/2018   Renal disorder    just after TIA acute kidney injury   TIA (transient ischemic attack) 03/16/2017   [facility]  The patient has a previous history of left breast cancer diagnosed in 2000. She was seen by [facility] in [place], [place] under Dr. Loreta Ave. According to the patient, her left breast cancer was stage II with no lymph node involvement (likely T2N0). She had chemotherapy every 3 weeks for about 6 months. She did not takes any "red drugs." She also did not take anti-estrogens (likely ER/PR negative).   TIA in 2018. She saw a neurologist as a precaution. She denies any clotting issues.    PAST SURGICAL HISTORY: Past Surgical History:  Procedure Laterality Date   BREAST LUMPECTOMY WITH AXILLARY LYMPH NODE BIOPSY Left 2000   biopsy   BREAST SURGERY     partial mastectomy with lymph node removal   ENDOBRONCHIAL ULTRASOUND Bilateral 09/05/2020   Procedure: ENDOBRONCHIAL ULTRASOUND;  Surgeon: Collene Gobble, MD;  Location: [facility];  Service: Cardiopulmonary;  Laterality: Bilateral;   MASTECTOMY Right 2019   & partial mastectomy left breast 2000   MASTECTOMY MODIFIED RADICAL Right 08/26/2018   Procedure: MASTECTOMY MODIFIED RADICAL;  Surgeon: Jovita Kussmaul, MD;  Location: [facility] [place];  Service: General;  Laterality: Right;   MODIFIED MASTECTOMY Right 08/26/2018   PORTACATH PLACEMENT Left 04/08/2018   Procedure: INSERTION PORT-A-CATH;  Surgeon: Jovita Kussmaul, MD;  Location: [employer];  Service: General;  Laterality: Left;   TUBAL LIGATION Bilateral 2004   VIDEO BRONCHOSCOPY N/A 09/05/2020   Procedure: VIDEO BRONCHOSCOPY WITHOUT FLUORO;  Surgeon: Collene Gobble, MD;  Location: [facility];  Service: Cardiopulmonary;  Laterality: N/A;   WISDOM  TOOTH EXTRACTION      FAMILY HISTORY Family History  Problem Relation Age of Onset   Hypertension Mother    Hypertension Father    Diverticulosis Father    Diabetes Father    Lung cancer Father 77       hx smoking   Hypertension Maternal Grandmother    CAD Maternal Grandmother    Alzheimer's disease Maternal Grandmother        died at 26   Hypertension Paternal Grandmother    CAD Paternal Grandmother    Stroke Paternal Grandfather    Sickle cell trait Paternal Grandfather    Pneumonia Maternal Aunt        died at a young adult   Lymphoma Paternal Aunt 11  The patient's father is alive at age 33 and has a history of lung cancer. The patient's mother is alive at age 94. The patient has 1 brother and no sisters. She denies a family history of breast or ovarian cancer.     GYNECOLOGIC HISTORY:  Patient's last menstrual period was 12/21/2017. Menarche: 50 years old Age at first live birth: 50 years old She is GXP3.  Her LMP was  January 2019.  The patient used oral contraceptive from [phone] and the Depo-provera shot from [phone] with no complications. She  never used HRT.  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  She also worked at [employer] as a Occupational psychologist.  She is now disabled.  Her husband, [name], is a Administrator. The patient's son, Shea Stakes age 64, works at [employer] in [place]. The patient's daughters, Lilia Pro age 62, and Levonne Spiller age 3, are students. Lilia Pro plans to attend [employer] in the fall. The patient's adopted son, Vonna Kotyk age 68, is also a Ship broker.    ADVANCED DIRECTIVES: In the absence of any documents to the contrary the patient's husband is her healthcare power of attorney   HEALTH MAINTENANCE: Social History   Tobacco Use   Smoking status: Never   Smokeless tobacco: Never  Vaping Use   Vaping Use: Never used  Substance Use Topics    Alcohol use: No   Drug use: No     Colonoscopy: n/a  PAP: 2015  Bone density: never done   Allergies  Allergen Reactions   Ace Inhibitors Swelling    Swelling of lips and face   Lisinopril Swelling    Swelling of lips, face   Amlodipine Swelling    Leg swelling   Shellfish Allergy Swelling   Adhesive [Tape] Itching and Rash    Please use paper tape   Latex Itching and Rash    Current Outpatient Medications  Medication Sig Dispense Refill   albuterol (VENTOLIN HFA) 108 (90 Base) MCG/ACT inhaler Inhale 2 puffs into the lungs every 6 (six) hours as needed for wheezing or shortness of breath. 8 g 2   Ascorbic Acid (VITAMIN C) 1000 MG tablet Take 1,000 mg by mouth daily.     carvedilol (COREG) 3.125 MG tablet Take 3.125 mg by mouth 2 (two) times daily with a meal.     Cyanocobalamin (VITAMIN B12) 500 MCG TABS Take 500 mcg by mouth daily.      dexamethasone (DECADRON) 4 MG tablet Take 2 tablets (8 mg total) by mouth daily. Start the day after chemotherapy for 2 days. 30 tablet 1   ferrous sulfate 325 (65 FE) MG tablet Take 1 tablet (325 mg total) by mouth daily. 30 tablet 0   gabapentin (NEURONTIN) 300 MG capsule Take one tablet in the morning, one tablet early afternoon, and two tablets at bedtime 120 capsule 1   guaiFENesin (ROBITUSSIN) 100 MG/5ML liquid Take 200 mg by mouth 3 (three) times daily as needed for cough.     lidocaine-prilocaine (EMLA) cream Apply to affected area once 30 g 3   Multiple Vitamin (MULITIVITAMIN WITH MINERALS) TABS Take 1 tablet by mouth daily.     prochlorperazine (COMPAZINE) 10 MG tablet Take 1 tablet (10 mg total) by mouth every 6 (six) hours as needed (Nausea or vomiting). 30 tablet 1   traMADol (ULTRAM) 50 MG tablet Take 1-2 tablets (50-100 mg total) by mouth every 6 (six) hours as needed. 60 tablet 0   valACYclovir (VALTREX) 1000 MG tablet Take 1 tablet (1,000 mg total) by mouth 3 (three) times daily. 21 tablet 0   venlafaxine XR (EFFEXOR-XR) 37.5 MG  24 hr capsule TAKE 1 CAPSULE BY MOUTH DAILY WITH BREAKFAST. 90 capsule 1   No current facility-administered medications for this visit.   Facility-Administered Medications Ordered in Other Visits  Medication Dose Route Frequency Provider Last Rate Last Admin   acetaminophen (TYLENOL) 325 MG tablet  diphenhydrAMINE (BENADRYL) 25 mg capsule            fam-trastuzumab deruxtecan-nxki (ENHERTU) 400 mg in dextrose 5 % 100 mL chemo infusion  5.1 mg/kg (Treatment Plan Recorded) Intravenous Once Caterra Ostroff, Virgie Dad, MD       heparin lock flush 100 unit/mL  500 Units Intracatheter Once PRN Jhovany Weidinger, Virgie Dad, MD       palonosetron (ALOXI) 0.25 MG/5ML injection            sodium chloride flush (NS) 0.9 % injection 10 mL  10 mL Intracatheter PRN Jestine Bicknell, Virgie Dad, MD        OBJECTIVE: African-American woman who appears stated age  Vitals:   08/20/21 1010  BP: (!) 119/59  Pulse: 72  Resp: 13  Temp: (!) 97.2 F (36.2 C)  SpO2: 100%    Wt Readings from Last 3 Encounters:  08/20/21 171 lb 4.8 oz (77.7 kg)  07/23/21 172 lb 6.4 oz (78.2 kg)  07/19/21 170 lb 4 oz (77.2 kg)   Body mass index is 34.6 kg/m.    ECOG FS:2 - Symptomatic, <50% confined to bed  Sclerae unicteric, EOMs intact Wearing a mask No cervical or supraclavicular adenopathy Lungs no rales or rhonchi Heart regular rate and rhythm Abd soft, nontender, positive bowel sounds MSK no focal spinal tenderness, no upper extremity lymphedema Neuro: nonfocal, well oriented, appropriate affect Breasts: Over the left breast and around to the back there are resolving shingles lesions, no longer vesicular, with some hypopigmentation.   LAB RESULTS:  CMP     Component Value Date/Time   NA 139 08/20/2021 0946   K 4.2 08/20/2021 0946   CL 110 08/20/2021 0946   CO2 20 (L) 08/20/2021 0946   GLUCOSE 109 (H) 08/20/2021 0946   BUN 41 (H) 08/20/2021 0946   CREATININE 2.13 (H) 08/20/2021 0946   CREATININE 2.77 (H) 05/30/2021  0917   CALCIUM 10.7 (H) 08/20/2021 0946   CALCIUM 11.2 (H) 02/24/2020 0830   PROT 6.8 08/20/2021 0946   ALBUMIN 2.7 (L) 08/20/2021 0946   AST 50 (H) 08/20/2021 0946   AST 96 (H) 05/30/2021 0917   ALT 31 08/20/2021 0946   ALT 56 (H) 05/30/2021 0917   ALKPHOS 122 08/20/2021 0946   BILITOT 0.6 08/20/2021 0946   BILITOT 1.8 (H) 05/30/2021 0917   GFRNONAA 28 (L) 08/20/2021 0946   GFRNONAA 20 (L) 05/30/2021 0917   GFRAA 41 (L) 08/30/2020 0808   GFRAA 45 (L) 07/31/2020 1400    Lab Results  Component Value Date   TOTALPROTELP 7.0 02/25/2019     Lab Results  Component Value Date   KPAFRELGTCHN 76.7 (H) 02/25/2019   LAMBDASER 27.2 (H) 02/25/2019   KAPLAMBRATIO 2.82 (H) 02/25/2019    Lab Results  Component Value Date   WBC 5.0 08/20/2021   NEUTROABS 3.0 08/20/2021   HGB 6.6 (LL) 08/20/2021   HCT 20.0 (L) 08/20/2021   MCV 94.3 08/20/2021   PLT 88 (L) 08/20/2021   No results found for: LABCA2  No components found for: QZESPQ330  No results for input(s): INR in the last 168 hours.   No results found for: LABCA2  Lab Results  Component Value Date   CAN199 90 (H) 06/07/2021    No results found for: QTM226  No results found for: JFH545  Lab Results  Component Value Date   CA2729 63.5 (H) 06/07/2021    No components found for: HGQUANT  No results found for: CEA1 / No results found  for: CEA1   No results found for: AFPTUMOR  No results found for: CHROMOGRNA  No results found for: HGBA, HGBA2QUANT, HGBFQUANT, HGBSQUAN (Hemoglobinopathy evaluation)   No results found for: LDH  No results found for: IRON, TIBC, IRONPCTSAT (Iron and TIBC)  Lab Results  Component Value Date   FERRITIN 104 08/20/2021    Urinalysis    Component Value Date/Time   COLORURINE YELLOW 05/30/2021 1153   APPEARANCEUR CLEAR 05/30/2021 1153   LABSPEC 1.010 05/30/2021 1153   PHURINE 5.0 05/30/2021 1153   GLUCOSEU NEGATIVE 05/30/2021 1153   HGBUR SMALL (A) 05/30/2021 1153    BILIRUBINUR NEGATIVE 05/30/2021 [place] 05/30/2021 1153   PROTEINUR 30 (A) 05/30/2021 1153   UROBILINOGEN 0.2 11/29/2011 1656   NITRITE NEGATIVE 05/30/2021 1153   LEUKOCYTESUR NEGATIVE 05/30/2021 1153    STUDIES: No results found.    ELIGIBLE FOR AVAILABLE RESEARCH PROTOCOL: no   ASSESSMENT: 50 y.o. [place], [place] woman  (1) status post left lumpectomy in 2000 for a (?) T2N0 breast cancer,  (a) status post adjuvant chemotherapy  (b) status post adjuvant radiation  (c) did not receive antiestrogens  (2) genetics testing May 18, 2018 through the common Hereditary Cancers Panel + Myelodysplastic Syndrome/Leukemia Panel found no deleterious mutations in APC, ATM, AXIN2, BARD1, BLM, BMPR1A, BRCA1, BRCA2, BRIP1, CDH1, CDK4, CDKN2A (p14ARF), CDKN2A (p16INK4a), CEBPA, CHEK2, CTNNA1, DICER1, EPCAM*, GATA2, GREM1*, HRAS, KIT, MEN1, MLH1, MSH2, MSH3, MSH6, MUTYH, NBN, NF1, PALB2, PDGFRA, PMS2, POLD1, POLE, PTEN, RAD50, RAD51C, RAD51D, RUNX1, SDHB, SDHC, SDHD, SMAD4, SMARCA4, STK11, TERC, TERT, TP53, TSC1, TSC2, VHL The following genes were evaluated for sequence changes only: HOXB13*, NTHL1*, SDHA  (a)  3 variants of uncertain significance were identified in the genes BARD1 c.764A>G (p.Asn255Ser), BRIP1 c.2563C>T (p.Arg855Cys), and PALB2 c.3103A>T (p.Ile1035Phe).   (3) status post right breast biopsy 03/16/2018 for a clinical mT2-3 N2, anatomic stage III invasive ductal carcinoma, grade 2, triple positive, with an MIB-1 of 80%.  (a) bone scan and chest CT scan negative for metastases except for a T6 lytic lesion of concern  (b) thoracic spine MRI was ordered May 2019 but never performed.  (4) neoadjuvant chemotherapy consisting of carboplatin, docetaxel, trastuzumab and Pertuzumab every 3 weeks x 6 starting 04/16/2018  (a) pertuzumab held beginning with cycle 2 because of diarrhea  (5) trastuzumab was to be continued Q4w indefinitely, discontinued after 10/21/2019 dose with  progression  (a) echocardiogram on 06/29/2018 showed an ejection fraction in the 65-70% range  (b) echocardiogram 11/19/2018 showed an ejection fraction in the 60-65% range  (c) echo 04/07/2019 shows an ejection fraction in the 55-60% range  (d) echo 07/15/2019 shows EF of 60-65%--for additional echoes see under #12 below  (6) right mastectomy and sentinel lymph node sampling on 08/26/2018 found a residual 2.5cm invasive ductal carcinoma, with 2 of 5 sentinel lymph nodes positive, ypT2, ypN1a; margins were clear  (a) repeat prognostic panel confirms tumor still triple positive (HER2 3+)  (7) adjuvant radiation 09/30/2018 - 11/16/2018  Site/dose:   The patient initially received a dose of 50.4 Gy in 28 fractions to the right chest wall and supraclavicular region. This was delivered using a 3-D conformal, 4 field technique. The patient then received a boost to the mastectomy scar. This delivered an additional 10 Gy in 5 fractions using an en face electron field. The total dose was 60.4   (8) anastrozole started 12/03/2018, discontinued August 2022 with progression  (a) [place] on 10/01/2018 was 64.3, and estradiol 7.0, consistent  with menopause  (b) referral to pelvic floor rehabilitation 12/03/2018  (c) [place] on 03/16/2020 was 80.8 with estradiol less than 2.5; repeat 11/29/2021 was 70.1   METASTATIC DISEASE: March 2020 (9) CT of neck and CT angiography of the chest 02/04/2019 finds the T6 lytic lesion noted May 2019 (see #3 above) is now sclerotic; a sclerotic T3 lesion is stable; there is a new lytic lesion in the manubrium  (a) PET scan 03/12/2019 shows manubrium, T3 and T5 metastases, no visceral disease  (b) biopsy of manubrial lesion planned but not done secondary to pandemic  (c) Bone scan 09/01/19 shows ? progression of disease in spine; MRI 09/02/19 shows early metastases in T10 (new), T3 and T5 stable; CT chest 09/01/19 shows 50mm pulmonary nodules that are new, but nonspecific and warrant future  follow up  (d) PET 10/26/2019 shows no lung or liver lesions; new mediastinal adenopathy, bone lesions-- T-DM1 started  (e) PET scan 03/08/2020 shows resolution of the mediastinal adenopathy and hypermetabolic bone lesions, questionable subcutaneous nodule along the left lateral shoulder  (f) for additional staging studies see under "12" below  (10) SRS to spine, palliative treatment to sternum, from 05/13/2019 through 05/26/2019:  1. Chest_sternum // 40 Gy in 10 fractions  2. Thoracic Spine (T3) // 18 Gy in 1 fraction   3. Thoracic Spine (T5) // 18 Gy in 1 fraction   (11) denosumab/Xgeva started 09/08/2019, given every 6 weeks  (12) T-DM1 started 01/15/20201, discontinued after 07/02/2021 dose with progression  (a) echo 10/26/2019 shows stable EF at 55-60%  (b) echo 03/08/2020 shows an ejection fraction in the 70-75% range  (c) echo 07/12/2020 shows an ejection fraction in the 65 to 70% range.  (d) PET scan 03/08/2020 shows no active disease; a 0.9 cm subcutaneous nodule in the left shoulder area seen on the PET was not identifiable by exam 03/16/2020  (e) PET 08/24/2020 shows stable disease except for a new 0.6 cm lymph node with an SUV of 10.1  (f) bronchoscopy 09/05/2020 with endobronchial ultrasound found no detectable mass in the region noted on the September PET scan  (g) T-DM1 discontinued and trastuzumab resumed 11/29/2020  (h) PET scan 02/26/2021 shows mild nodular increase: TDM-1 resumed 03/07/2021, discontinued after 05/09/2021 dose pending restaging studies  (i) noncontrast chest CT 06/01/2021 shows possibly mildly enlarged adenopathy, and an apparently new sclerotic lesion at T8.  There was also evidence of pancreatitis and possible pneumonitis  (j) PET scan 07/17/2021 shows progression in lymph nodes and bone, but no evidence of involvement of lung or liver and no evidence of residual pancreatitis.  There is continued unexplained gastric pyloric hypermetabolic activity.  (13)  Enhertu started 07/23/2021, repeated every 21 days  (14) fulvestrant Q 21 days (alternating 500/250 mg doses) started 07/23/2021  (15) anemia of renal failure:  (A) retacrit started 12/27/2020 at 10K unit every 21 days, dose gradually increased to current 40K units Q 21 days   PLAN: [name] tolerated her first Enhertu dose moderately well and we are proceeding with the second dose today.  The plan is to restage her after 4 doses  She tolerates her other medications without any unusual side effects.  She is doing a good job at managing the zoster, using the Emla cream successfully for the pain.  I have gone ahead and refilled that for her.  I was sorry to learn the difficulties with her adopted son.  At this point it is not clear whether the boy is going to be able  to get back with them given his aggressiveness and difficulty controlling anger.  She knows to call for any other issue that may develop before the next visit.  Total encounter time 25 minutes.Sarajane Jews C. Jarod Bozzo, MD 08/20/21 11:53 AM Medical Oncology and Hematology [facility] [place], [place] [address] Tel. [phone]    Fax. [phone]   I, Wilburn Mylar, am acting as scribe for Dr. Virgie Dad. Leeam Cedrone.  I, Lurline Del MD, have reviewed the above documentation for accuracy and completeness, and I agree with the above.   *Total Encounter Time as defined by the Centers for Medicare and Medicaid Services includes, in addition to the face-to-face time of a patient visit (documented in the note above) non-face-to-face time: obtaining and reviewing outside history, ordering and reviewing medications, tests or procedures, care coordination (communications with other health care professionals or caregivers) and documentation in the medical record.

## 2021-08-20 ENCOUNTER — Inpatient Hospital Stay: Payer: BC Managed Care – PPO | Attending: Oncology

## 2021-08-20 ENCOUNTER — Telehealth: Payer: Self-pay | Admitting: *Deleted

## 2021-08-20 ENCOUNTER — Other Ambulatory Visit: Payer: Self-pay

## 2021-08-20 ENCOUNTER — Other Ambulatory Visit: Payer: Self-pay | Admitting: *Deleted

## 2021-08-20 ENCOUNTER — Inpatient Hospital Stay: Payer: BC Managed Care – PPO

## 2021-08-20 ENCOUNTER — Inpatient Hospital Stay (HOSPITAL_BASED_OUTPATIENT_CLINIC_OR_DEPARTMENT_OTHER): Payer: BC Managed Care – PPO | Admitting: Oncology

## 2021-08-20 VITALS — BP 119/59 | HR 72 | Temp 97.2°F | Resp 13 | Ht 59.0 in | Wt 171.3 lb

## 2021-08-20 VITALS — BP 119/65 | HR 64 | Temp 98.9°F | Resp 16

## 2021-08-20 DIAGNOSIS — N184 Chronic kidney disease, stage 4 (severe): Secondary | ICD-10-CM

## 2021-08-20 DIAGNOSIS — Z5111 Encounter for antineoplastic chemotherapy: Secondary | ICD-10-CM | POA: Insufficient documentation

## 2021-08-20 DIAGNOSIS — Z170 Estrogen receptor positive status [ER+]: Secondary | ICD-10-CM | POA: Diagnosis not present

## 2021-08-20 DIAGNOSIS — Z79899 Other long term (current) drug therapy: Secondary | ICD-10-CM | POA: Diagnosis not present

## 2021-08-20 DIAGNOSIS — C50411 Malignant neoplasm of upper-outer quadrant of right female breast: Secondary | ICD-10-CM | POA: Diagnosis not present

## 2021-08-20 DIAGNOSIS — C50811 Malignant neoplasm of overlapping sites of right female breast: Secondary | ICD-10-CM | POA: Insufficient documentation

## 2021-08-20 DIAGNOSIS — C7951 Secondary malignant neoplasm of bone: Secondary | ICD-10-CM | POA: Diagnosis not present

## 2021-08-20 DIAGNOSIS — D649 Anemia, unspecified: Secondary | ICD-10-CM

## 2021-08-20 DIAGNOSIS — D631 Anemia in chronic kidney disease: Secondary | ICD-10-CM | POA: Insufficient documentation

## 2021-08-20 DIAGNOSIS — N19 Unspecified kidney failure: Secondary | ICD-10-CM | POA: Diagnosis not present

## 2021-08-20 DIAGNOSIS — Z5112 Encounter for antineoplastic immunotherapy: Secondary | ICD-10-CM | POA: Diagnosis not present

## 2021-08-20 DIAGNOSIS — Z95828 Presence of other vascular implants and grafts: Secondary | ICD-10-CM

## 2021-08-20 DIAGNOSIS — K858 Other acute pancreatitis without necrosis or infection: Secondary | ICD-10-CM

## 2021-08-20 LAB — CBC WITH DIFFERENTIAL/PLATELET
Abs Immature Granulocytes: 0.03 10*3/uL (ref 0.00–0.07)
Basophils Absolute: 0 10*3/uL (ref 0.0–0.1)
Basophils Relative: 0 %
Eosinophils Absolute: 0.2 10*3/uL (ref 0.0–0.5)
Eosinophils Relative: 3 %
HCT: 20 % — ABNORMAL LOW (ref 36.0–46.0)
Hemoglobin: 6.6 g/dL — CL (ref 12.0–15.0)
Immature Granulocytes: 1 %
Lymphocytes Relative: 17 %
Lymphs Abs: 0.8 10*3/uL (ref 0.7–4.0)
MCH: 31.1 pg (ref 26.0–34.0)
MCHC: 33 g/dL (ref 30.0–36.0)
MCV: 94.3 fL (ref 80.0–100.0)
Monocytes Absolute: 0.9 10*3/uL (ref 0.1–1.0)
Monocytes Relative: 18 %
Neutro Abs: 3 10*3/uL (ref 1.7–7.7)
Neutrophils Relative %: 61 %
Platelets: 88 10*3/uL — ABNORMAL LOW (ref 150–400)
RBC: 2.12 MIL/uL — ABNORMAL LOW (ref 3.87–5.11)
RDW: 19.8 % — ABNORMAL HIGH (ref 11.5–15.5)
WBC: 5 10*3/uL (ref 4.0–10.5)
nRBC: 0 % (ref 0.0–0.2)

## 2021-08-20 LAB — COMPREHENSIVE METABOLIC PANEL
ALT: 31 U/L (ref 0–44)
AST: 50 U/L — ABNORMAL HIGH (ref 15–41)
Albumin: 2.7 g/dL — ABNORMAL LOW (ref 3.5–5.0)
Alkaline Phosphatase: 122 U/L (ref 38–126)
Anion gap: 9 (ref 5–15)
BUN: 41 mg/dL — ABNORMAL HIGH (ref 6–20)
CO2: 20 mmol/L — ABNORMAL LOW (ref 22–32)
Calcium: 10.7 mg/dL — ABNORMAL HIGH (ref 8.9–10.3)
Chloride: 110 mmol/L (ref 98–111)
Creatinine, Ser: 2.13 mg/dL — ABNORMAL HIGH (ref 0.44–1.00)
GFR, Estimated: 28 mL/min — ABNORMAL LOW (ref >60)
Glucose, Bld: 109 mg/dL — ABNORMAL HIGH (ref 70–99)
Potassium: 4.2 mmol/L (ref 3.5–5.1)
Sodium: 139 mmol/L (ref 135–145)
Total Bilirubin: 0.6 mg/dL (ref 0.3–1.2)
Total Protein: 6.8 g/dL (ref 6.5–8.1)

## 2021-08-20 LAB — RETICULOCYTES
Immature Retic Fract: 30.9 % — ABNORMAL HIGH (ref 2.3–15.9)
RBC.: 2.14 MIL/uL — ABNORMAL LOW (ref 3.87–5.11)
Retic Count, Absolute: 128.2 10*3/uL (ref 19.0–186.0)
Retic Ct Pct: 6 % — ABNORMAL HIGH (ref 0.4–3.1)

## 2021-08-20 LAB — PREPARE RBC (CROSSMATCH)

## 2021-08-20 LAB — FERRITIN: Ferritin: 104 ng/mL (ref 11–307)

## 2021-08-20 MED ORDER — PALONOSETRON HCL INJECTION 0.25 MG/5ML
INTRAVENOUS | Status: AC
  Filled 2021-08-20: qty 5

## 2021-08-20 MED ORDER — DENOSUMAB 120 MG/1.7ML ~~LOC~~ SOLN
120.0000 mg | Freq: Once | SUBCUTANEOUS | Status: AC
  Administered 2021-08-20: 120 mg via SUBCUTANEOUS

## 2021-08-20 MED ORDER — SODIUM CHLORIDE 0.9% FLUSH
10.0000 mL | INTRAVENOUS | Status: DC | PRN
  Administered 2021-08-20: 10 mL

## 2021-08-20 MED ORDER — FAM-TRASTUZUMAB DERUXTECAN-NXKI CHEMO 100 MG IV SOLR: 5.4000 mg/kg | Freq: Once | INTRAVENOUS | Status: DC

## 2021-08-20 MED ORDER — EPOETIN ALFA-EPBX 40000 UNIT/ML IJ SOLN: 40000.0000 [IU] | Freq: Once | INTRAMUSCULAR | Status: DC

## 2021-08-20 MED ORDER — HEPARIN SOD (PORK) LOCK FLUSH 100 UNIT/ML IV SOLN
500.0000 [IU] | Freq: Once | INTRAVENOUS | Status: AC | PRN
  Administered 2021-08-20: 500 [IU]

## 2021-08-20 MED ORDER — DEXTROSE 5 % IV SOLN: Freq: Once | INTRAVENOUS | Status: AC

## 2021-08-20 MED ORDER — EPOETIN ALFA-EPBX 10000 UNIT/ML IJ SOLN
40000.0000 [IU] | Freq: Once | INTRAMUSCULAR | Status: AC
  Administered 2021-08-20: 40000 [IU] via SUBCUTANEOUS
  Filled 2021-08-20: qty 4

## 2021-08-20 MED ORDER — ACETAMINOPHEN 325 MG PO TABS
ORAL_TABLET | ORAL | Status: AC
  Filled 2021-08-20: qty 2

## 2021-08-20 MED ORDER — SODIUM CHLORIDE 0.9% FLUSH
10.0000 mL | INTRAVENOUS | Status: AC | PRN
  Administered 2021-08-20: 10 mL

## 2021-08-20 MED ORDER — SODIUM CHLORIDE 0.9 % IV SOLN
10.0000 mg | Freq: Once | INTRAVENOUS | Status: AC
  Administered 2021-08-20: 10 mg via INTRAVENOUS
  Filled 2021-08-20: qty 10

## 2021-08-20 MED ORDER — EPOETIN ALFA-EPBX 40000 UNIT/ML IJ SOLN
40000.0000 [IU] | Freq: Once | INTRAMUSCULAR | Status: DC
  Filled 2021-08-20: qty 1

## 2021-08-20 MED ORDER — ACETAMINOPHEN 325 MG PO TABS
650.0000 mg | ORAL_TABLET | Freq: Once | ORAL | Status: AC
  Administered 2021-08-20: 650 mg via ORAL

## 2021-08-20 MED ORDER — PALONOSETRON HCL INJECTION 0.25 MG/5ML
0.2500 mg | Freq: Once | INTRAVENOUS | Status: AC
  Administered 2021-08-20: 0.25 mg via INTRAVENOUS

## 2021-08-20 MED ORDER — FULVESTRANT 250 MG/5ML IM SOLN
250.0000 mg | Freq: Once | INTRAMUSCULAR | Status: AC
  Administered 2021-08-20: 250 mg via INTRAMUSCULAR
  Filled 2021-08-20: qty 5

## 2021-08-20 MED ORDER — DIPHENHYDRAMINE HCL 25 MG PO CAPS
ORAL_CAPSULE | ORAL | Status: AC
  Filled 2021-08-20: qty 1

## 2021-08-20 MED ORDER — FAM-TRASTUZUMAB DERUXTECAN-NXKI CHEMO 100 MG IV SOLR
5.1000 mg/kg | Freq: Once | INTRAVENOUS | Status: AC
  Administered 2021-08-20: 400 mg via INTRAVENOUS
  Filled 2021-08-20: qty 20

## 2021-08-20 MED ORDER — DIPHENHYDRAMINE HCL 25 MG PO CAPS
25.0000 mg | ORAL_CAPSULE | Freq: Once | ORAL | Status: AC
Start: 2021-08-20 — End: 2021-08-20
  Administered 2021-08-20: 25 mg via ORAL

## 2021-08-20 MED ORDER — SODIUM CHLORIDE 0.9% IV SOLUTION
250.0000 mL | Freq: Once | INTRAVENOUS | Status: AC
  Administered 2021-08-20: 250 mL via INTRAVENOUS

## 2021-08-20 MED ORDER — LIDOCAINE-PRILOCAINE 2.5-2.5 % EX CREA: TOPICAL_CREAM | CUTANEOUS | 3 refills | Status: AC

## 2021-08-20 MED ORDER — DENOSUMAB 120 MG/1.7ML ~~LOC~~ SOLN
SUBCUTANEOUS | Status: AC
  Filled 2021-08-20: qty 1.7

## 2021-08-20 NOTE — Progress Notes (Signed)
Ok to give xgeva early as pt has a Cca of 11.74

## 2021-08-20 NOTE — Progress Notes (Signed)
Per Dr. Jana Hakim, "OK To treat with low plts and H/H today."

## 2021-08-20 NOTE — Telephone Encounter (Signed)
Per MD - ok to proceed with treatment today despite noted low heme of 6.6 and plts of 88,000.  MD is requesting for pt to receive 1 unit of PRBC's this week.

## 2021-08-20 NOTE — Patient Instructions (Addendum)
[facility]  Discharge Instructions: Thank you for choosing [name] to provide your oncology and hematology care.   If you have a lab appointment with the [facility], please go directly to the [facility] and check in at the registration area.   Wear comfortable clothing and clothing appropriate for easy access to any Portacath or PICC line.   We strive to give you quality time with your provider. You may need to reschedule your appointment if you arrive late (15 or more minutes).  Arriving late affects you and other patients whose appointments are after yours.  Also, if you miss three or more appointments without notifying the office, you may be dismissed from the clinic at the provider's discretion.      For prescription refill requests, have your pharmacy contact our office and allow 72 hours for refills to be completed.    Today you received the following chemotherapy and/or immunotherapy agents Fam-Trastuzumab Deruxtecan-nxki, 1unit Packed Red Blood Cells, Xgeva, Procrit, and Fulvestrant injections.    To help prevent nausea and vomiting after your treatment, we encourage you to take your nausea medication as directed.  BELOW ARE SYMPTOMS THAT SHOULD BE REPORTED IMMEDIATELY: *FEVER GREATER THAN 100.4 F (38 C) OR HIGHER *CHILLS OR SWEATING *NAUSEA AND VOMITING THAT IS NOT CONTROLLED WITH YOUR NAUSEA MEDICATION *UNUSUAL SHORTNESS OF BREATH *UNUSUAL BRUISING OR BLEEDING *URINARY PROBLEMS (pain or burning when urinating, or frequent urination) *BOWEL PROBLEMS (unusual diarrhea, constipation, pain near the anus) TENDERNESS IN MOUTH AND THROAT WITH OR WITHOUT PRESENCE OF ULCERS (sore throat, sores in mouth, or a toothache) UNUSUAL RASH, SWELLING OR PAIN  UNUSUAL VAGINAL DISCHARGE OR ITCHING   Items with * indicate a potential emergency and should be followed up as soon as possible or go to the Emergency Department if any problems should  occur.  Please show the CHEMOTHERAPY ALERT CARD or IMMUNOTHERAPY ALERT CARD at check-in to the Emergency Department and triage nurse.  Should you have questions after your visit or need to cancel or reschedule your appointment, please contact [facility]  Dept: [phone]  and follow the prompts.  Office hours are 8:00 a.m. to 4:30 p.m. Monday - Friday. Please note that voicemails left after 4:00 p.m. may not be returned until the following business day.  We are closed weekends and major holidays. You have access to a nurse at all times for urgent questions. Please call the main number to the clinic Dept: [phone] and follow the prompts.   For any non-urgent questions, you may also contact your provider using MyChart. We now offer e-Visits for anyone 41 and older to request care online for non-urgent symptoms. For details visit [email or web address].   Also download the MyChart app! Go to the app store, search "MyChart", open the app, select [facility], and log in with your MyChart username and password.  Due to Covid, a mask is required upon entering the hospital/clinic. If you do not have a mask, one will be given to you upon arrival. For doctor visits, patients may have 1 support person aged 63 or older with them. For treatment visits, patients cannot have anyone with them due to current Covid guidelines and our immunocompromised population.   Blood Transfusion, Adult, Care After This sheet gives you information about how to care for yourself after your procedure. Your doctor may also give you more specific instructions. If you have problems or questions, contact your doctor. What can I  expect after the procedure? After the procedure, it is common to have: Bruising and soreness at the IV site. A headache. Follow these instructions at home: Insertion site care   Follow instructions from your doctor about how to take care of your insertion site. This is  where an IV tube was put into your vein. Make sure you: Wash your hands with soap and water before and after you change your bandage (dressing). If you cannot use soap and water, use hand sanitizer. Change your bandage as told by your doctor. Check your insertion site every day for signs of infection. Check for: Redness, swelling, or pain. Bleeding from the site. Warmth. Pus or a bad smell. General instructions Take over-the-counter and prescription medicines only as told by your doctor. Rest as told by your doctor. Go back to your normal activities as told by your doctor. Keep all follow-up visits as told by your doctor. This is important. Contact a doctor if: You have itching or red, swollen areas of skin (hives). You feel worried or nervous (anxious). You feel weak after doing your normal activities. You have redness, swelling, warmth, or pain around the insertion site. You have blood coming from the insertion site, and the blood does not stop with pressure. You have pus or a bad smell coming from the insertion site. Get help right away if: You have signs of a serious reaction. This may be coming from an allergy or the body's defense system (immune system). Signs include: Trouble breathing or shortness of breath. Swelling of the face or feeling warm (flushed). Fever or chills. Head, chest, or back pain. Dark pee (urine) or blood in the pee. Widespread rash. Fast heartbeat. Feeling dizzy or light-headed. You may receive your blood transfusion in an outpatient setting. If so, you will be told whom to contact to report any reactions. These symptoms may be an emergency. Do not wait to see if the symptoms will go away. Get medical help right away. Call your local emergency services (911 in the [place] Do not drive yourself to the hospital. Summary Bruising and soreness at the IV site are common. Check your insertion site every day for signs of infection. Rest as told by your doctor.  Go back to your normal activities as told by your doctor. Get help right away if you have signs of a serious reaction. This information is not intended to replace advice given to you by your health care provider. Make sure you discuss any questions you have with your health care provider. Document Revised: 03/15/2021 Document Reviewed: 05/13/2019 Elsevier Patient Education  [employer].

## 2021-08-21 ENCOUNTER — Telehealth: Payer: Self-pay | Admitting: Oncology

## 2021-08-21 LAB — TYPE AND SCREEN
ABO/RH(D): O POS
Antibody Screen: POSITIVE
DAT, IgG: NEGATIVE
Donor AG Type: NEGATIVE
Unit division: 0

## 2021-08-21 LAB — BPAM RBC
Blood Product Expiration Date: 202210172359
ISSUE DATE / TIME: 202209191407
Unit Type and Rh: 5100

## 2021-08-21 NOTE — Telephone Encounter (Signed)
Scheduled appointment per 09/19 los. Patient is aware.

## 2021-08-24 ENCOUNTER — Ambulatory Visit: Payer: BC Managed Care – PPO | Admitting: Gastroenterology

## 2021-08-31 ENCOUNTER — Encounter: Payer: Self-pay | Admitting: Oncology

## 2021-09-01 ENCOUNTER — Other Ambulatory Visit: Payer: Self-pay | Admitting: Oncology

## 2021-09-01 NOTE — Progress Notes (Signed)
    [facility] [address] [place], [place]  [address] Phone:  [phone]   Fax:  [phone]   08/31/2021 MRN; [id number]     Dear Provider:   Thank you for your kind referral of the above patient.  We have attempted to schedule the recommended Office Visit but have not been able to schedule because:     X   The patient no showed for her appointment and did not call back to reschedule.   ___ The patient declined to schedule the procedure at this time.   We appreciate the referral and hope that we will have the opportunity to treat this patient in the future.       Sincerely,       [facility] [phone]

## 2021-09-09 NOTE — Progress Notes (Signed)
[place]  [phone] [phone]    ID: [name] [name] DOB: March 13, 1971  MR#: [id number]  HBZ#:[id number]  Patient Care Team: Marda Stalker, PA-C as PCP - General (Family Medicine) Jerline Pain, MD as PCP - Cardiology (Cardiology) Tayvin Preslar, Virgie Dad, MD as Consulting Physician (Oncology) Loreta Ave, MD as Referring Physician (Internal Medicine) Donato Heinz, MD as Consulting Physician (Nephrology) Kyung Rudd, MD as Consulting Physician (Radiation Oncology) Jovita Kussmaul, MD as Consulting Physician (General Surgery) Larey Dresser, MD as Consulting Physician (Cardiology) Mauro Kaufmann, RN as Oncology Nurse Navigator Rockwell Germany, RN as Oncology Nurse Navigator Raina Mina, RPH-CPP (Pharmacist) OTHER MD:   CHIEF COMPLAINT: Triple positive breast cancer (s/p right mastectomy)  CURRENT TREATMENT: Enhertu; fulvestrant; denosumab/Xgeva; retacrit   INTERVAL HISTORY: [name] returns today for follow up and treatment of her triple positive breast cancer.    She was started on Enhertu and fulvestrant, both given every 3 weeks, on 07/23/2021.  (She also received denosumab/Xgeva and retacrit the same day.)  She will receive her third dose of Enhertu today.  Her most recent echo was 07/19/2021 and repeat will be due in November.  She is receiving fulvestrant every 3 weeks, with the dose adjusted to account for the altered dosing interval.  She was switched back to [place] on 07/23/2021, now given every 6 weeks.  Her most recent treatment was 08/20/2021 and her next treatment will be 10/01/2021  We received notice from [facility] that she did not show to her appointment.  That now has been rescheduled for 09/21/2021 Dustin Flock).  Joellen Jersey has met with nephrology (Dr.Singh).  She was started on Retacrit here for her anemia of renal failure 06/11/2021.  Her dose was increased to 40,000 units 07/02/2021.  She receives this depending  on her hemoglobin, scheduled every 3 weeks to coincide with her anti-HER2 treatment.  Her most recent transfusion was on 08/23/2021.   REVIEW OF SYSTEMS: [name] tells me that over the last couple of days she has had pain in the right upper quadrant when she eats.  She thought this was her pancreas.  She is taking to chicken broth and liquids for now.  She has vomited several times.  She has not had a fever.  She has been painting a room in her house has not had shortness of breath palpitations or feeling faint.  She has been constipated.  Aside from these issues a detailed review of systems today was stable   COVID 19 VACCINATION STATUS: [place] x2, most recently 03/2020; infection 04/2021   HISTORY OF CURRENT ILLNESS: From the original intake note:  [name] [name] palpated a lump on 01/15/2018. She felt soreness and shooting pain under her arm and in the right breast. She underwent bilateral diagnostic mammography with tomography and right breast ultrasonography at [facility] on 03/12/2018 showing: breast density category C. The is architectural distortion at the 11 o'clock position. There is also an oval mass in the right breast anterior depth. Examination of the right axilla showed enlarged lymph nodes. Ultrasonography showed a 4.6 cm mass in the right breast upper outer quadrant posterior depth. An additional 1.2 cm oval mass in the right breast 12 o'clock middle depth. The lymph nodes in the right axillary are highly suggestive of malignancy.   Accordingly on 03/16/2018 she proceeded to biopsy of the right breast area in question. The pathology from this procedure showed ([id number]): At both the 11 and 12 o'clock positions: Invasive ductal carcinoma grade II.  Ductal carcinoma in situ, high grade, with necrosis and calcifications. Prognostic indicators significant for: estrogen receptor, 90% positive and progesterone receptor, 40% positive, both with strong staining intensity. Proliferation marker Ki67 at  80%. HER2 amplified with ratios HER2/CEP17 SIGNALS 6.90 and average HER2 copies per cell 14.50  The patient's subsequent history is as detailed below.   PAST MEDICAL HISTORY: Past Medical History:  Diagnosis Date   Anxiety    Breast cancer, left breast ([place]) 2000   Underwent lumpectomy, chemotherapy and radiation   Facial paralysis/Bells palsy    left side   Family history of lung cancer    Family history of lymphoma    Hypertension    Neuropathy    IN FINGERS AND TOES   RECENT INFUSION OF CHEMO   Personal history of breast cancer 05/06/2018   Renal disorder    just after TIA acute kidney injury   TIA (transient ischemic attack) 03/16/2017   [facility]  The patient has a previous history of left breast cancer diagnosed in 2000. She was seen by [facility] in [place], [place] under Dr. Loreta Ave. According to the patient, her left breast cancer was stage II with no lymph node involvement (likely T2N0). She had chemotherapy every 3 weeks for about 6 months. She did not takes any "red drugs." She also did not take anti-estrogens (likely ER/PR negative).   TIA in 2018. She saw a neurologist as a precaution. She denies any clotting issues.    PAST SURGICAL HISTORY: Past Surgical History:  Procedure Laterality Date   BREAST LUMPECTOMY WITH AXILLARY LYMPH NODE BIOPSY Left 2000   biopsy   BREAST SURGERY     partial mastectomy with lymph node removal   ENDOBRONCHIAL ULTRASOUND Bilateral 09/05/2020   Procedure: ENDOBRONCHIAL ULTRASOUND;  Surgeon: Collene Gobble, MD;  Location: [facility];  Service: Cardiopulmonary;  Laterality: Bilateral;   MASTECTOMY Right 2019   & partial mastectomy left breast 2000   MASTECTOMY MODIFIED RADICAL Right 08/26/2018   Procedure: MASTECTOMY MODIFIED RADICAL;  Surgeon: Jovita Kussmaul, MD;  Location: [facility];  Service: General;  Laterality: Right;   MODIFIED MASTECTOMY Right 08/26/2018   PORTACATH PLACEMENT Left 04/08/2018   Procedure:  INSERTION PORT-A-CATH;  Surgeon: Jovita Kussmaul, MD;  Location: [facility];  Service: General;  Laterality: Left;   TUBAL LIGATION Bilateral 2004   VIDEO BRONCHOSCOPY N/A 09/05/2020   Procedure: VIDEO BRONCHOSCOPY WITHOUT FLUORO;  Surgeon: Collene Gobble, MD;  Location: [facility];  Service: Cardiopulmonary;  Laterality: N/A;   WISDOM TOOTH EXTRACTION      FAMILY HISTORY Family History  Problem Relation Age of Onset   Hypertension Mother    Hypertension Father    Diverticulosis Father    Diabetes Father    Lung cancer Father 74       hx smoking   Hypertension Maternal Grandmother    CAD Maternal Grandmother    Alzheimer's disease Maternal Grandmother        died at 14   Hypertension Paternal Grandmother    CAD Paternal Grandmother    Stroke Paternal Grandfather    Sickle cell trait Paternal Grandfather    Pneumonia Maternal Aunt        died at a young adult   Lymphoma Paternal Aunt 16  The patient's father is alive at age 44 and has a history of lung cancer. The patient's mother is alive at age 59. The patient has 1 brother and no sisters. She denies a family history  of breast or ovarian cancer.     GYNECOLOGIC HISTORY:  Patient's last menstrual period was 12/21/2017. Menarche: 50 years old Age at first live birth: 50 years old She is GXP3.  Her LMP was  January 2019.  The patient used oral contraceptive from [phone] and the Depo-provera shot from [phone] with no complications. She never used HRT.  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  She also worked at [employer] as a Occupational psychologist.  She is now disabled.  Her husband, [name], is a Administrator. The patient's son, Shea Stakes age 74, works at [employer] in [place]. The patient's daughters, Lilia Pro age 41, and Levonne Spiller age 62, are students. Lilia Pro plans to attend [employer] in the fall. The patient's adopted son,  Vonna Kotyk age 86, is also a Ship broker.    ADVANCED DIRECTIVES: In the absence of any documents to the contrary the patient's husband is her healthcare power of attorney   HEALTH MAINTENANCE: Social History   Tobacco Use   Smoking status: Never   Smokeless tobacco: Never  Vaping Use   Vaping Use: Never used  Substance Use Topics   Alcohol use: No   Drug use: No     Colonoscopy: n/a  PAP: 2015  Bone density: never done   Allergies  Allergen Reactions   Ace Inhibitors Swelling    Swelling of lips and face   Lisinopril Swelling    Swelling of lips, face   Amlodipine Swelling    Leg swelling   Shellfish Allergy Swelling   Adhesive [Tape] Itching and Rash    Please use paper tape   Latex Itching and Rash    Current Outpatient Medications  Medication Sig Dispense Refill   albuterol (VENTOLIN HFA) 108 (90 Base) MCG/ACT inhaler Inhale 2 puffs into the lungs every 6 (six) hours as needed for wheezing or shortness of breath. 8 g 2   Ascorbic Acid (VITAMIN C) 1000 MG tablet Take 1,000 mg by mouth daily.     carvedilol (COREG) 3.125 MG tablet Take 3.125 mg by mouth 2 (two) times daily with a meal.     Cyanocobalamin (VITAMIN B12) 500 MCG TABS Take 500 mcg by mouth daily.      dexamethasone (DECADRON) 4 MG tablet Take 2 tablets (8 mg total) by mouth daily. Start the day after chemotherapy for 2 days. 30 tablet 1   ferrous sulfate 325 (65 FE) MG tablet Take 1 tablet (325 mg total) by mouth daily. 30 tablet 0   gabapentin (NEURONTIN) 300 MG capsule Take one tablet in the morning, one tablet early afternoon, and two tablets at bedtime 120 capsule 1   guaiFENesin (ROBITUSSIN) 100 MG/5ML liquid Take 200 mg by mouth 3 (three) times daily as needed for cough.     lidocaine-prilocaine (EMLA) cream Apply to affected area once 30 g 3   Multiple Vitamin (MULITIVITAMIN WITH MINERALS) TABS Take 1 tablet by mouth daily.     prochlorperazine (COMPAZINE) 10 MG tablet Take 1 tablet (10 mg total) by mouth  every 6 (six) hours as needed (Nausea or vomiting). 30 tablet 1   sodium bicarbonate 325 MG tablet Take 1 tablet (325 mg total) by mouth 4 (four) times daily.     traMADol (ULTRAM) 50 MG tablet Take 1-2 tablets (50-100 mg total) by mouth every 6 (six) hours as needed. 60 tablet 0   valACYclovir (VALTREX) 1000 MG  tablet Take 1 tablet (1,000 mg total) by mouth 3 (three) times daily. 21 tablet 0   venlafaxine XR (EFFEXOR-XR) 37.5 MG 24 hr capsule TAKE 1 CAPSULE BY MOUTH DAILY WITH BREAKFAST. 90 capsule 1   No current facility-administered medications for this visit.    OBJECTIVE: African-American woman who appears stated age  50:   09/10/21 0856  BP: 127/77  Pulse: 75  Resp: 19  Temp: 97.7 F (36.5 C)  SpO2: 100%     Wt Readings from Last 3 Encounters:  09/10/21 165 lb 3.2 oz (74.9 kg)  08/20/21 171 lb 4.8 oz (77.7 kg)  07/23/21 172 lb 6.4 oz (78.2 kg)   Body mass index is 33.37 kg/m.    ECOG FS:2 - Symptomatic, <50% confined to bed  Sclerae unicteric, EOMs intact Wearing a mask No cervical or supraclavicular adenopathy Lungs no rales or rhonchi Heart regular rate and rhythm Abd soft, with mild right upper quadrant tenderness, no rebound,, positive bowel sounds MSK no focal spinal tenderness, no upper extremity lymphedema Neuro: nonfocal, well oriented, appropriate affect Breasts: Deferred   LAB RESULTS:  CMP     Component Value Date/Time   NA 139 08/20/2021 0946   K 4.2 08/20/2021 0946   CL 110 08/20/2021 0946   CO2 20 (L) 08/20/2021 0946   GLUCOSE 109 (H) 08/20/2021 0946   BUN 41 (H) 08/20/2021 0946   CREATININE 2.13 (H) 08/20/2021 0946   CREATININE 2.77 (H) 05/30/2021 0917   CALCIUM 10.7 (H) 08/20/2021 0946   CALCIUM 11.2 (H) 02/24/2020 0830   PROT 6.8 08/20/2021 0946   ALBUMIN 2.7 (L) 08/20/2021 0946   AST 50 (H) 08/20/2021 0946   AST 96 (H) 05/30/2021 0917   ALT 31 08/20/2021 0946   ALT 56 (H) 05/30/2021 0917   ALKPHOS 122 08/20/2021 0946    BILITOT 0.6 08/20/2021 0946   BILITOT 1.8 (H) 05/30/2021 0917   GFRNONAA 28 (L) 08/20/2021 0946   GFRNONAA 20 (L) 05/30/2021 0917   GFRAA 41 (L) 08/30/2020 0808   GFRAA 45 (L) 07/31/2020 1400    Lab Results  Component Value Date   TOTALPROTELP 7.0 02/25/2019     Lab Results  Component Value Date   KPAFRELGTCHN 76.7 (H) 02/25/2019   LAMBDASER 27.2 (H) 02/25/2019   KAPLAMBRATIO 2.82 (H) 02/25/2019    Lab Results  Component Value Date   WBC 4.5 09/10/2021   NEUTROABS 2.7 09/10/2021   HGB 7.4 (L) 09/10/2021   HCT 22.3 (L) 09/10/2021   MCV 94.5 09/10/2021   PLT 132 (L) 09/10/2021   No results found for: LABCA2  No components found for: HCWCBJ628  No results for input(s): INR in the last 168 hours.   No results found for: LABCA2  Lab Results  Component Value Date   CAN199 90 (H) 06/07/2021    No results found for: BTD176  No results found for: HYW737  Lab Results  Component Value Date   CA2729 63.5 (H) 06/07/2021    No components found for: HGQUANT  No results found for: CEA1 / No results found for: CEA1   No results found for: AFPTUMOR  No results found for: CHROMOGRNA  No results found for: HGBA, HGBA2QUANT, HGBFQUANT, HGBSQUAN (Hemoglobinopathy evaluation)   No results found for: LDH  No results found for: IRON, TIBC, IRONPCTSAT (Iron and TIBC)  Lab Results  Component Value Date   FERRITIN 104 08/20/2021    Urinalysis    Component Value Date/Time   COLORURINE YELLOW 05/30/2021 1153  APPEARANCEUR CLEAR 05/30/2021 1153   LABSPEC 1.010 05/30/2021 1153   PHURINE 5.0 05/30/2021 1153   GLUCOSEU NEGATIVE 05/30/2021 1153   HGBUR SMALL (A) 05/30/2021 1153   BILIRUBINUR NEGATIVE 05/30/2021 [place] 05/30/2021 1153   PROTEINUR 30 (A) 05/30/2021 1153   UROBILINOGEN 0.2 11/29/2011 1656   NITRITE NEGATIVE 05/30/2021 1153   LEUKOCYTESUR NEGATIVE 05/30/2021 1153    STUDIES: No results found.   ELIGIBLE FOR AVAILABLE  RESEARCH PROTOCOL: no   ASSESSMENT: 50 y.o. [place], [place] woman  (1) status post left lumpectomy in 2000 for a (?) T2N0 breast cancer,  (a) status post adjuvant chemotherapy  (b) status post adjuvant radiation  (c) did not receive antiestrogens  (2) genetics testing May 18, 2018 through the common Hereditary Cancers Panel + Myelodysplastic Syndrome/Leukemia Panel found no deleterious mutations in APC, ATM, AXIN2, BARD1, BLM, BMPR1A, BRCA1, BRCA2, BRIP1, CDH1, CDK4, CDKN2A (p14ARF), CDKN2A (p16INK4a), CEBPA, CHEK2, CTNNA1, DICER1, EPCAM*, GATA2, GREM1*, HRAS, KIT, MEN1, MLH1, MSH2, MSH3, MSH6, MUTYH, NBN, NF1, PALB2, PDGFRA, PMS2, POLD1, POLE, PTEN, RAD50, RAD51C, RAD51D, RUNX1, SDHB, SDHC, SDHD, SMAD4, SMARCA4, STK11, TERC, TERT, TP53, TSC1, TSC2, VHL The following genes were evaluated for sequence changes only: HOXB13*, NTHL1*, SDHA  (a)  3 variants of uncertain significance were identified in the genes BARD1 c.764A>G (p.Asn255Ser), BRIP1 c.2563C>T (p.Arg855Cys), and PALB2 c.3103A>T (p.Ile1035Phe).   (3) status post right breast biopsy 03/16/2018 for a clinical mT2-3 N2, anatomic stage III invasive ductal carcinoma, grade 2, triple positive, with an MIB-1 of 80%.  (a) bone scan and chest CT scan negative for metastases except for a T6 lytic lesion of concern  (b) thoracic spine MRI was ordered May 2019 but never performed.  (4) neoadjuvant chemotherapy consisting of carboplatin, docetaxel, trastuzumab and Pertuzumab every 3 weeks x 6 starting 04/16/2018  (a) pertuzumab held beginning with cycle 2 because of diarrhea  (5) trastuzumab was to be continued Q4w indefinitely, discontinued after 10/21/2019 dose with progression  (a) echocardiogram on 06/29/2018 showed an ejection fraction in the 65-70% range  (b) echocardiogram 11/19/2018 showed an ejection fraction in the 60-65% range  (c) echo 04/07/2019 shows an ejection fraction in the 55-60% range  (d) echo 07/15/2019 shows EF of 60-65%--for  additional echoes see under #12 below  (6) right mastectomy and sentinel lymph node sampling on 08/26/2018 found a residual 2.5cm invasive ductal carcinoma, with 2 of 5 sentinel lymph nodes positive, ypT2, ypN1a; margins were clear  (a) repeat prognostic panel confirms tumor still triple positive (HER2 3+)  (7) adjuvant radiation 09/30/2018 - 11/16/2018  Site/dose:   The patient initially received a dose of 50.4 Gy in 28 fractions to the right chest wall and supraclavicular region. This was delivered using a 3-D conformal, 4 field technique. The patient then received a boost to the mastectomy scar. This delivered an additional 10 Gy in 5 fractions using an en face electron field. The total dose was 60.4   (8) anastrozole started 12/03/2018, discontinued August 2022 with progression  (a) [place] on 10/01/2018 was 64.3, and estradiol 7.0, consistent with menopause  (b) referral to pelvic floor rehabilitation 12/03/2018  (c) [place] on 03/16/2020 was 80.8 with estradiol less than 2.5; repeat 11/29/2021 was 70.1   METASTATIC DISEASE: March 2020 (9) CT of neck and CT angiography of the chest 02/04/2019 finds the T6 lytic lesion noted May 2019 (see #3 above) is now sclerotic; a sclerotic T3 lesion is stable; there is a new lytic lesion in the manubrium  (a) PET  scan 03/12/2019 shows manubrium, T3 and T5 metastases, no visceral disease  (b) biopsy of manubrial lesion planned but not done secondary to pandemic  (c) Bone scan 09/01/19 shows ? progression of disease in spine; MRI 09/02/19 shows early metastases in T10 (new), T3 and T5 stable; CT chest 09/01/19 shows 51mm pulmonary nodules that are new, but nonspecific and warrant future follow up  (d) PET 10/26/2019 shows no lung or liver lesions; new mediastinal adenopathy, bone lesions-- T-DM1 started  (e) PET scan 03/08/2020 shows resolution of the mediastinal adenopathy and hypermetabolic bone lesions, questionable subcutaneous nodule along the left lateral  shoulder  (f) for additional staging studies see under "12" below  (10) SRS to spine, palliative treatment to sternum, from 05/13/2019 through 05/26/2019:  1. Chest_sternum // 40 Gy in 10 fractions  2. Thoracic Spine (T3) // 18 Gy in 1 fraction   3. Thoracic Spine (T5) // 18 Gy in 1 fraction   (11) denosumab/Xgeva started 09/08/2019, given every 6 weeks  (12) T-DM1 started 01/15/20201, discontinued after 07/02/2021 dose with disease progression  (a) echo 10/26/2019 shows stable EF at 55-60%  (b) echo 03/08/2020 shows an ejection fraction in the 70-75% range  (c) echo 07/12/2020 shows an ejection fraction in the 65 to 70% range.  (d) PET scan 03/08/2020 shows no active disease; a 0.9 cm subcutaneous nodule in the left shoulder area seen on the PET was not identifiable by exam 03/16/2020  (e) PET 08/24/2020 shows stable disease except for a new 0.6 cm lymph node with an SUV of 10.1  (f) bronchoscopy 09/05/2020 with endobronchial ultrasound found no detectable mass in the region noted on the September PET scan  (g) T-DM1 discontinued and trastuzumab resumed 11/29/2020  (h) PET scan 02/26/2021 shows mild nodular increase: TDM-1 resumed 03/07/2021, discontinued after 05/09/2021 dose pending restaging studies  (i) noncontrast chest CT 06/01/2021 shows possibly mildly enlarged adenopathy, and an apparently new sclerotic lesion at T8.  There was also evidence of pancreatitis and possible pneumonitis  (j) PET scan 07/17/2021 shows progression in lymph nodes and bone, but no evidence of involvement of lung or liver and no evidence of residual pancreatitis.  There is continued unexplained gastric pyloric hypermetabolic activity.  (13) Enhertu started 07/23/2021, repeated every 21 days  (14) fulvestrant Q 21 days (alternating 500/250 mg doses) started 07/23/2021  (15) anemia of renal failure:  (A) retacrit started 12/27/2020 at 10K unit every 21 days, dose gradually increased to current 40K units Q 21  days   PLAN: [name] is tolerating Enhertu moderately well.  She does feel fatigued she says for more than a week after each treatment.  She takes her steroids appropriately and also uses Compazine for nausea as needed.  She now has symptoms consistent with cholecystitis.  Currently, on broth and avoiding fats, she does not have fever, vomiting, or other acute symptoms although she does have some pain on right upper quadrant to palpation.  We will go and obtain an abdominal ultrasound to look for gallstones.  Recall she had evidence of pancreatitis not that long ago and likely she is passing stones and that is the cause of these issues.  She may well need cholecystectomy in the near future.  She tells me she was late for her appointment with GI but that has been rescheduled.  She understands it was not some uptake at the base of her esophagus and we do need to find out about what that represents.  Reticulocyte count is improved.  However  she remains significantly anemic.  I offered her transfusion but she is asymptomatic right now as far as her anemia is concerned and she would prefer to wait.  She will receive Retacrit again today of course.  Otherwise she will return in 3 weeks for her fourth Enhertu dose.  I will restage her after that.  Total encounter time 35 minutes.Sarajane Jews C. Jazziel Fitzsimmons, MD 09/10/21 9:14 AM Medical Oncology and Hematology [facility] [place], [place] [address] Tel. [phone]    Fax. [phone]   I, Wilburn Mylar, am acting as scribe for Dr. Virgie Dad. Samon Dishner.  I, Lurline Del MD, have reviewed the above documentation for accuracy and completeness, and I agree with the above.   *Total Encounter Time as defined by the Centers for Medicare and Medicaid Services includes, in addition to the face-to-face time of a patient visit (documented in the note above) non-face-to-face time: obtaining and reviewing outside history,  ordering and reviewing medications, tests or procedures, care coordination (communications with other health care professionals or caregivers) and documentation in the medical record.

## 2021-09-10 ENCOUNTER — Inpatient Hospital Stay (HOSPITAL_BASED_OUTPATIENT_CLINIC_OR_DEPARTMENT_OTHER): Payer: BC Managed Care – PPO | Admitting: Oncology

## 2021-09-10 ENCOUNTER — Inpatient Hospital Stay: Payer: BC Managed Care – PPO | Attending: Oncology

## 2021-09-10 ENCOUNTER — Other Ambulatory Visit: Payer: Self-pay

## 2021-09-10 ENCOUNTER — Inpatient Hospital Stay: Payer: BC Managed Care – PPO

## 2021-09-10 VITALS — BP 127/77 | HR 75 | Temp 97.7°F | Resp 19 | Ht 59.0 in | Wt 165.2 lb

## 2021-09-10 DIAGNOSIS — Z95828 Presence of other vascular implants and grafts: Secondary | ICD-10-CM

## 2021-09-10 DIAGNOSIS — C50411 Malignant neoplasm of upper-outer quadrant of right female breast: Secondary | ICD-10-CM

## 2021-09-10 DIAGNOSIS — Z170 Estrogen receptor positive status [ER+]: Secondary | ICD-10-CM

## 2021-09-10 DIAGNOSIS — C7951 Secondary malignant neoplasm of bone: Secondary | ICD-10-CM

## 2021-09-10 DIAGNOSIS — Z5111 Encounter for antineoplastic chemotherapy: Secondary | ICD-10-CM | POA: Diagnosis not present

## 2021-09-10 DIAGNOSIS — Z79899 Other long term (current) drug therapy: Secondary | ICD-10-CM | POA: Diagnosis not present

## 2021-09-10 DIAGNOSIS — Z8719 Personal history of other diseases of the digestive system: Secondary | ICD-10-CM | POA: Insufficient documentation

## 2021-09-10 DIAGNOSIS — Z5112 Encounter for antineoplastic immunotherapy: Secondary | ICD-10-CM | POA: Diagnosis not present

## 2021-09-10 DIAGNOSIS — D631 Anemia in chronic kidney disease: Secondary | ICD-10-CM | POA: Diagnosis not present

## 2021-09-10 DIAGNOSIS — K858 Other acute pancreatitis without necrosis or infection: Secondary | ICD-10-CM

## 2021-09-10 DIAGNOSIS — C50811 Malignant neoplasm of overlapping sites of right female breast: Secondary | ICD-10-CM | POA: Insufficient documentation

## 2021-09-10 DIAGNOSIS — N184 Chronic kidney disease, stage 4 (severe): Secondary | ICD-10-CM

## 2021-09-10 DIAGNOSIS — N19 Unspecified kidney failure: Secondary | ICD-10-CM | POA: Diagnosis not present

## 2021-09-10 LAB — CBC WITH DIFFERENTIAL/PLATELET
Abs Immature Granulocytes: 0.03 10*3/uL (ref 0.00–0.07)
Basophils Absolute: 0 10*3/uL (ref 0.0–0.1)
Basophils Relative: 1 %
Eosinophils Absolute: 0.2 10*3/uL (ref 0.0–0.5)
Eosinophils Relative: 5 %
HCT: 22.3 % — ABNORMAL LOW (ref 36.0–46.0)
Hemoglobin: 7.4 g/dL — ABNORMAL LOW (ref 12.0–15.0)
Immature Granulocytes: 1 %
Lymphocytes Relative: 17 %
Lymphs Abs: 0.8 10*3/uL (ref 0.7–4.0)
MCH: 31.4 pg (ref 26.0–34.0)
MCHC: 33.2 g/dL (ref 30.0–36.0)
MCV: 94.5 fL (ref 80.0–100.0)
Monocytes Absolute: 0.8 10*3/uL (ref 0.1–1.0)
Monocytes Relative: 17 %
Neutro Abs: 2.7 10*3/uL (ref 1.7–7.7)
Neutrophils Relative %: 59 %
Platelets: 132 10*3/uL — ABNORMAL LOW (ref 150–400)
RBC: 2.36 MIL/uL — ABNORMAL LOW (ref 3.87–5.11)
RDW: 20.4 % — ABNORMAL HIGH (ref 11.5–15.5)
WBC: 4.5 10*3/uL (ref 4.0–10.5)
nRBC: 0 % (ref 0.0–0.2)

## 2021-09-10 LAB — COMPREHENSIVE METABOLIC PANEL
ALT: 25 U/L (ref 0–44)
AST: 40 U/L (ref 15–41)
Albumin: 2.8 g/dL — ABNORMAL LOW (ref 3.5–5.0)
Alkaline Phosphatase: 102 U/L (ref 38–126)
Anion gap: 7 (ref 5–15)
BUN: 22 mg/dL — ABNORMAL HIGH (ref 6–20)
CO2: 18 mmol/L — ABNORMAL LOW (ref 22–32)
Calcium: 10.4 mg/dL — ABNORMAL HIGH (ref 8.9–10.3)
Chloride: 111 mmol/L (ref 98–111)
Creatinine, Ser: 1.55 mg/dL — ABNORMAL HIGH (ref 0.44–1.00)
GFR, Estimated: 41 mL/min — ABNORMAL LOW (ref >60)
Glucose, Bld: 110 mg/dL — ABNORMAL HIGH (ref 70–99)
Potassium: 3.4 mmol/L — ABNORMAL LOW (ref 3.5–5.1)
Sodium: 136 mmol/L (ref 135–145)
Total Bilirubin: 0.7 mg/dL (ref 0.3–1.2)
Total Protein: 6.5 g/dL (ref 6.5–8.1)

## 2021-09-10 LAB — RETICULOCYTES
Immature Retic Fract: 31.3 % — ABNORMAL HIGH (ref 2.3–15.9)
RBC.: 2.35 MIL/uL — ABNORMAL LOW (ref 3.87–5.11)
Retic Count, Absolute: 118.4 10*3/uL (ref 19.0–186.0)
Retic Ct Pct: 5 % — ABNORMAL HIGH (ref 0.4–3.1)

## 2021-09-10 LAB — AMYLASE: Amylase: 94 U/L (ref 28–100)

## 2021-09-10 LAB — LIPASE, BLOOD: Lipase: 76 U/L — ABNORMAL HIGH (ref 11–51)

## 2021-09-10 MED ORDER — SODIUM CHLORIDE 0.9% FLUSH: 10.0000 mL | Freq: Once | INTRAVENOUS | Status: DC

## 2021-09-10 MED ORDER — DEXAMETHASONE SODIUM PHOSPHATE 100 MG/10ML IJ SOLN
10.0000 mg | Freq: Once | INTRAMUSCULAR | Status: AC
  Administered 2021-09-10: 10 mg via INTRAVENOUS
  Filled 2021-09-10: qty 10

## 2021-09-10 MED ORDER — DIPHENHYDRAMINE HCL 25 MG PO CAPS
25.0000 mg | ORAL_CAPSULE | Freq: Once | ORAL | Status: AC
  Administered 2021-09-10: 25 mg via ORAL
  Filled 2021-09-10: qty 1

## 2021-09-10 MED ORDER — PALONOSETRON HCL INJECTION 0.25 MG/5ML
0.2500 mg | Freq: Once | INTRAVENOUS | Status: AC
  Administered 2021-09-10: 0.25 mg via INTRAVENOUS
  Filled 2021-09-10: qty 5

## 2021-09-10 MED ORDER — HEPARIN SOD (PORK) LOCK FLUSH 100 UNIT/ML IV SOLN
500.0000 [IU] | Freq: Once | INTRAVENOUS | Status: AC | PRN
  Administered 2021-09-10: 500 [IU]

## 2021-09-10 MED ORDER — DEXTROSE 5 % IV SOLN: Freq: Once | INTRAVENOUS | Status: AC

## 2021-09-10 MED ORDER — EPOETIN ALFA-EPBX 40000 UNIT/ML IJ SOLN
40000.0000 [IU] | Freq: Once | INTRAMUSCULAR | Status: AC
  Administered 2021-09-10: 40000 [IU] via SUBCUTANEOUS

## 2021-09-10 MED ORDER — FULVESTRANT 250 MG/5ML IM SOLN
500.0000 mg | Freq: Once | INTRAMUSCULAR | Status: AC
  Administered 2021-09-10: 500 mg via INTRAMUSCULAR
  Filled 2021-09-10: qty 10

## 2021-09-10 MED ORDER — FAM-TRASTUZUMAB DERUXTECAN-NXKI CHEMO 100 MG IV SOLR
5.3500 mg/kg | Freq: Once | INTRAVENOUS | Status: AC
  Administered 2021-09-10: 400 mg via INTRAVENOUS
  Filled 2021-09-10: qty 20

## 2021-09-10 MED ORDER — ACETAMINOPHEN 325 MG PO TABS
650.0000 mg | ORAL_TABLET | Freq: Once | ORAL | Status: AC
  Administered 2021-09-10: 650 mg via ORAL
  Filled 2021-09-10: qty 2

## 2021-09-10 MED ORDER — SODIUM CHLORIDE 0.9% FLUSH
10.0000 mL | INTRAVENOUS | Status: DC | PRN
  Administered 2021-09-10: 10 mL

## 2021-09-10 NOTE — Patient Instructions (Signed)
[facility]  Discharge Instructions: Thank you for choosing [facility] to provide your oncology and hematology care.   If you have a lab appointment with the [facility], please go directly to the [facility] and check in at the registration area.   Wear comfortable clothing and clothing appropriate for easy access to any Portacath or PICC line.   We strive to give you quality time with your provider. You may need to reschedule your appointment if you arrive late (15 or more minutes).  Arriving late affects you and other patients whose appointments are after yours.  Also, if you miss three or more appointments without notifying the office, you may be dismissed from the clinic at the provider's discretion.      For prescription refill requests, have your pharmacy contact our office and allow 72 hours for refills to be completed.    Today you received the following chemotherapy and/or immunotherapy agents Fam-Trastuzumab deruxtecan ([place]hertu)    To help prevent nausea and vomiting after your treatment, we encourage you to take your nausea medication as directed.  BELOW ARE SYMPTOMS THAT SHOULD BE REPORTED IMMEDIATELY: *FEVER GREATER THAN 100.4 F (38 C) OR HIGHER *CHILLS OR SWEATING *NAUSEA AND VOMITING THAT IS NOT CONTROLLED WITH YOUR NAUSEA MEDICATION *UNUSUAL SHORTNESS OF BREATH *UNUSUAL BRUISING OR BLEEDING *URINARY PROBLEMS (pain or burning when urinating, or frequent urination) *BOWEL PROBLEMS (unusual diarrhea, constipation, pain near the anus) TENDERNESS IN MOUTH AND THROAT WITH OR WITHOUT PRESENCE OF ULCERS (sore throat, sores in mouth, or a toothache) UNUSUAL RASH, SWELLING OR PAIN  UNUSUAL VAGINAL DISCHARGE OR ITCHING   Items with * indicate a potential emergency and should be followed up as soon as possible or go to the Emergency Department if any problems should occur.  Please show the CHEMOTHERAPY ALERT CARD or IMMUNOTHERAPY  ALERT CARD at check-in to the Emergency Department and triage nurse.  Should you have questions after your visit or need to cancel or reschedule your appointment, please contact [facility]  Dept: [phone]  and follow the prompts.  Office hours are 8:00 a.m. to 4:30 p.m. Monday - Friday. Please note that voicemails left after 4:00 p.m. may not be returned until the following business day.  We are closed weekends and major holidays. You have access to a nurse at all times for urgent questions. Please call the main number to the clinic Dept: [phone] and follow the prompts.   For any non-urgent questions, you may also contact your provider using MyChart. We now offer e-Visits for anyone 10 and older to request care online for non-urgent symptoms. For details visit [email or web address].   Also download the MyChart app! Go to the app store, search "MyChart", open the app, select [facility], and log in with your MyChart username and password.  Due to Covid, a mask is required upon entering the hospital/clinic. If you do not have a mask, one will be given to you upon arrival. For doctor visits, patients may have 1 support person aged 47 or older with them. For treatment visits, patients cannot have anyone with them due to current Covid guidelines and our immunocompromised population.   Fam-Trastuzumab deruxtecan injection What is this medication? TRASTUZUMAB DERUXTECAN (tras TOOZ eu mab DER ux TEE kan) is a chemotherapy medicine and a monoclonal antibody. It treats certain types of cancer. Some ofthe cancers treated are breast cancer and gastric cancer. This medicine may be used for other purposes;  ask your health care provider orpharmacist if you have questions. COMMON BRAND NAME(S): ENHERTU What should I tell my care team before I take this medication? They need to know if you have any of these conditions: heart disease heart failure infection (especially a  virus infection such as chickenpox, cold sores, or herpes) liver disease lung or breathing disease, like asthma an unusual or allergic reaction to fam-trastuzumab deruxtecan, other medications, foods, dyes, or preservatives pregnant or trying to get pregnant breast-feeding How should I use this medication? This medicine is for infusion into a vein. It is given by a health careprofessional in a hospital or clinic setting. Talk to your pediatrician regarding the use of this medicine in children.Special care may be needed. Overdosage: If you think you have taken too much of this medicine contact apoison control center or emergency room at once. NOTE: This medicine is only for you. Do not share this medicine with others. What if I miss a dose? It is important not to miss your dose. Call your doctor or health careprofessional if you are unable to keep an appointment. What may interact with this medication? Interaction studies have not been performed. This list may not describe all possible interactions. Give your health care provider a list of all the medicines, herbs, non-prescription drugs, or dietary supplements you use. Also tell them if you smoke, drink alcohol, or use illegaldrugs. Some items may interact with your medicine. What should I watch for while using this medication? Visit your healthcare professional for regular checks on your progress. Tell your healthcare professional if your symptoms do not start to get better or ifthey get worse. Your condition will be monitored carefully while you are receiving thismedicine. Do not become pregnant while taking this medicine or for 7 months after stopping it. Women should inform their healthcare professional if they wish to become pregnant or think they might be pregnant. Men should not father a child while taking this medicine and for 4 months after stopping it. There is potential for serious side effects to an unborn child. Talk to your  healthcareprofessional for more information. Do not breast-feed an infant while taking this medicine or for 7 months afterthe last dose. This medicine has caused decreased sperm counts in some men. This may make it more difficult to father a child. Talk to your healthcare professional if [place] concerned about your fertility. This medicine may increase your risk to bruise or bleed. Call your health careprofessional if you notice any unusual bleeding. Be careful brushing or flossing your teeth or using a toothpick because you may get an infection or bleed more easily. If you have any dental work done, Primary school teacher you are receiving this medicine. This medicine may cause dry eyes [and blurred vision]. If you wear contact lenses, you may feel some discomfort. Lubricating eye drops may help. See yourhealthcare professional if the problem does not go away or is severe. Call your healthcare professional for advice if you get a fever, chills, or sore throat, or other symptoms of a cold or flu. Do not treat yourself. This medicine decreases your body's ability to fight infections. Try to avoid beingaround people who are sick. Avoid taking medicines that contain aspirin, acetaminophen, ibuprofen, naproxen, or ketoprofen unless instructed by your healthcare professional.These medicines may hide a fever. What side effects may I notice from receiving this medication? Side effects that you should report to your doctor or health care professionalas soon as possible: allergic reactions like skin rash, itching or  hives, swelling of the face, lips, or tongue breathing problems cough nausea, vomiting signs and symptoms of bleeding such as bloody or black, tarry stools; red or dark-brown urine; spitting up blood or brown material that looks like coffee grounds; red spots on the skin; unusual bruising or bleeding from the eye, gums, or nose signs and symptoms of heart failure like breathing problems, fast, irregular  heartbeat, sudden weight gain; swelling of the ankles, feet, hands; unusually weak or tired signs and symptoms of infection like fever; chills; cough; sore throat; pain or trouble passing urine signs and symptoms of low red blood cells or anemia such as unusually weak or tired; feeling faint or lightheaded; falls; breathing problems Side effects that usually do not require medical attention (report these toyour doctor or health care professional if they continue or are bothersome): constipation diarrhea dry eyes hair loss loss of appetite mouth sores rash This list may not describe all possible side effects. Call your doctor for medical advice about side effects. You may report side effects to FDA at1-800-FDA-1088. Where should I keep my medication? This drug is given in a hospital or clinic and will not be stored at home. NOTE: This sheet is a summary. It may not cover all possible information. If you have questions about this medicine, talk to your doctor, pharmacist, orhealth care provider.  2022 Elsevier/Gold Standard (2020-04-19 16:25:39)  Fulvestrant injection What is this medication? FULVESTRANT (ful VES trant) blocks the effects of estrogen. It is used to treatbreast cancer. This medicine may be used for other purposes; ask your health care provider orpharmacist if you have questions. COMMON BRAND NAME(S): FASLODEX What should I tell my care team before I take this medication? They need to know if you have any of these conditions: bleeding disorders liver disease low blood counts, like low white cell, platelet, or red cell counts an unusual or allergic reaction to fulvestrant, other medicines, foods, dyes, or preservatives pregnant or trying to get pregnant breast-feeding How should I use this medication? This medicine is for injection into a muscle. It is usually given by a healthcare professional in a hospital or clinic setting. Talk to your pediatrician regarding the use  of this medicine in children.Special care may be needed. Overdosage: If you think you have taken too much of this medicine contact apoison control center or emergency room at once. NOTE: This medicine is only for you. Do not share this medicine with others. What if I miss a dose? It is important not to miss your dose. Call your doctor or health careprofessional if you are unable to keep an appointment. What may interact with this medication? medicines that treat or prevent blood clots like warfarin, enoxaparin, dalteparin, apixaban, dabigatran, and rivaroxaban This list may not describe all possible interactions. Give your health care provider a list of all the medicines, herbs, non-prescription drugs, or dietary supplements you use. Also tell them if you smoke, drink alcohol, or use illegaldrugs. Some items may interact with your medicine. What should I watch for while using this medication? Your condition will be monitored carefully while you are receiving this medicine. You will need important blood work done while you are taking thismedicine. Do not become pregnant while taking this medicine or for at least 1 year after stopping it. Women of child-bearing potential will need to have a negative pregnancy test before starting this medicine. Women should inform their doctor if they wish to become pregnant or think they might be pregnant. There is a  potential for serious side effects to an unborn child. Men should inform their doctors if they wish to father a child. This medicine may lower sperm counts. Talk to your health care professional or pharmacist for more information. Do not breast-feed an infant while taking this medicine or for 1 year after thelast dose. What side effects may I notice from receiving this medication? Side effects that you should report to your doctor or health care professionalas soon as possible: allergic reactions like skin rash, itching or hives, swelling of the face, lips,  or tongue feeling faint or lightheaded, falls pain, tingling, numbness, or weakness in the legs signs and symptoms of infection like fever or chills; cough; flu-like symptoms; sore throat vaginal bleeding Side effects that usually do not require medical attention (report to yourdoctor or health care professional if they continue or are bothersome): aches, pains constipation diarrhea headache hot flashes nausea, vomiting pain at site where injected stomach pain This list may not describe all possible side effects. Call your doctor for medical advice about side effects. You may report side effects to FDA at1-800-FDA-1088. Where should I keep my medication? This drug is given in a hospital or clinic and will not be stored at home. NOTE: This sheet is a summary. It may not cover all possible information. If you have questions about this medicine, talk to your doctor, pharmacist, orhealth care provider.  2022 Elsevier/Gold Standard (2018-02-26 11:34:41)

## 2021-09-10 NOTE — Progress Notes (Signed)
Per Dr. Jana Hakim, ok to treat with anemia & elevated scr.

## 2021-09-11 ENCOUNTER — Ambulatory Visit: Payer: BC Managed Care – PPO | Admitting: Gastroenterology

## 2021-09-13 ENCOUNTER — Other Ambulatory Visit: Payer: Self-pay

## 2021-09-13 ENCOUNTER — Ambulatory Visit (HOSPITAL_COMMUNITY)
Admission: RE | Admit: 2021-09-13 | Discharge: 2021-09-13 | Disposition: A | Discharge status: Home / Self Care | Payer: BC Managed Care – PPO | Source: Ambulatory Visit | Attending: Oncology | Admitting: Oncology

## 2021-09-13 DIAGNOSIS — C50411 Malignant neoplasm of upper-outer quadrant of right female breast: Secondary | ICD-10-CM | POA: Diagnosis not present

## 2021-09-13 DIAGNOSIS — Z170 Estrogen receptor positive status [ER+]: Secondary | ICD-10-CM | POA: Insufficient documentation

## 2021-09-13 DIAGNOSIS — K838 Other specified diseases of biliary tract: Secondary | ICD-10-CM | POA: Diagnosis not present

## 2021-09-13 DIAGNOSIS — Z0389 Encounter for observation for other suspected diseases and conditions ruled out: Secondary | ICD-10-CM | POA: Diagnosis not present

## 2021-09-17 ENCOUNTER — Other Ambulatory Visit: Payer: Self-pay | Admitting: Oncology

## 2021-09-17 DIAGNOSIS — Z170 Estrogen receptor positive status [ER+]: Secondary | ICD-10-CM

## 2021-09-17 DIAGNOSIS — C7951 Secondary malignant neoplasm of bone: Secondary | ICD-10-CM

## 2021-09-17 NOTE — Progress Notes (Signed)
I called [place] and left her message with the results of the abdominal ultrasound which do not show any evidence of gallstones.  If she is still having symptoms we will proceed to MRI as suggested otherwise we will continue to follow.  I gave her the number to call us back

## 2021-09-20 ENCOUNTER — Ambulatory Visit (INDEPENDENT_AMBULATORY_CARE_PROVIDER_SITE_OTHER): Payer: BC Managed Care – PPO | Admitting: Internal Medicine

## 2021-09-20 ENCOUNTER — Other Ambulatory Visit (INDEPENDENT_AMBULATORY_CARE_PROVIDER_SITE_OTHER): Payer: BC Managed Care – PPO

## 2021-09-20 ENCOUNTER — Encounter: Payer: Self-pay | Admitting: Internal Medicine

## 2021-09-20 VITALS — BP 110/64 | HR 80 | Ht 59.0 in | Wt 166.0 lb

## 2021-09-20 DIAGNOSIS — R935 Abnormal findings on diagnostic imaging of other abdominal regions, including retroperitoneum: Secondary | ICD-10-CM

## 2021-09-20 DIAGNOSIS — C50919 Malignant neoplasm of unspecified site of unspecified female breast: Secondary | ICD-10-CM

## 2021-09-20 DIAGNOSIS — R197 Diarrhea, unspecified: Secondary | ICD-10-CM

## 2021-09-20 DIAGNOSIS — R1013 Epigastric pain: Secondary | ICD-10-CM | POA: Diagnosis not present

## 2021-09-20 DIAGNOSIS — R140 Abdominal distension (gaseous): Secondary | ICD-10-CM

## 2021-09-20 LAB — CBC WITH DIFFERENTIAL/PLATELET
Basophils Absolute: 0 10*3/uL (ref 0.0–0.1)
Basophils Relative: 0.3 % (ref 0.0–3.0)
Eosinophils Absolute: 0.1 10*3/uL (ref 0.0–0.7)
Eosinophils Relative: 1.2 % (ref 0.0–5.0)
HCT: 23.5 % — CL (ref 36.0–46.0)
Hemoglobin: 7.8 g/dL — CL (ref 12.0–15.0)
Lymphocytes Relative: 13 % (ref 12.0–46.0)
Lymphs Abs: 0.8 10*3/uL (ref 0.7–4.0)
MCHC: 33.1 g/dL (ref 30.0–36.0)
MCV: 96.3 fl (ref 78.0–100.0)
Monocytes Absolute: 0.6 10*3/uL (ref 0.1–1.0)
Monocytes Relative: 10.1 % (ref 3.0–12.0)
Neutro Abs: 4.8 10*3/uL (ref 1.4–7.7)
Neutrophils Relative %: 75.4 % (ref 43.0–77.0)
Platelets: 78 10*3/uL — ABNORMAL LOW (ref 150.0–400.0)
RBC: 2.44 Mil/uL — ABNORMAL LOW (ref 3.87–5.11)
RDW: 21.3 % — ABNORMAL HIGH (ref 11.5–15.5)
WBC: 6.4 10*3/uL (ref 4.0–10.5)

## 2021-09-20 MED ORDER — PANTOPRAZOLE SODIUM 40 MG PO TBEC: 40.0000 mg | DELAYED_RELEASE_TABLET | Freq: Every day | ORAL | 1 refills | Status: DC

## 2021-09-20 NOTE — Patient Instructions (Signed)
If you are age 50 or younger, your body mass index should be between 19-25. Your Body mass index is 33.53 kg/m. If this is out of the aformentioned range listed, please consider follow up with your Primary Care Provider.  ________________________________________________________  The [facility] providers would like to encourage you to use [facility] to communicate with providers for non-urgent requests or questions.  Due to long hold times on the telephone, sending your provider a message by [facility] may be a faster and more efficient way to get a response.  Please allow 48 business hours for a response.  Please remember that this is for non-urgent requests.   Your provider has requested that you go to the basement level for lab work before leaving today. Press "B" on the elevator. The lab is located at the first door on the left as you exit the elevator.  Your provider has requested that you go to the basement level for an abdominal X-ray before leaving today. Press "B" on the elevator. The lab is located straight ahead as you exit the elevator.  START Pantoprazole 40 mg 1 tablet daily  You have been scheduled for an MRI/MRCP at ______ on _______. Your appointment time is ________. Please arrive to admitting (at main entrance of the hospital) 15 minutes prior to your appointment time for registration purposes. Please make certain not to have anything to eat or drink 6 hours prior to your test. In addition, if you have any metal in your body, have a pacemaker or defibrillator, please be sure to let your ordering physician know. This test typically takes 45 minutes to 1 hour to complete. Should you need to reschedule, please call [phone] to do so.  You have been scheduled for an endoscopy. Please follow written instructions given to you at your visit today. If you use inhalers (even only as needed), please bring them with you on the day of your procedure.  Follow up pending at this time.

## 2021-09-21 ENCOUNTER — Encounter: Payer: Self-pay | Admitting: Internal Medicine

## 2021-09-21 ENCOUNTER — Ambulatory Visit: Payer: BC Managed Care – PPO | Admitting: Gastroenterology

## 2021-09-21 ENCOUNTER — Telehealth: Payer: Self-pay | Admitting: Internal Medicine

## 2021-09-21 ENCOUNTER — Encounter: Payer: Self-pay | Admitting: Oncology

## 2021-09-21 LAB — COMPREHENSIVE METABOLIC PANEL
ALT: 53 U/L — ABNORMAL HIGH (ref 0–35)
AST: 37 U/L (ref 0–37)
Albumin: 3.3 g/dL — ABNORMAL LOW (ref 3.5–5.2)
Alkaline Phosphatase: 106 U/L (ref 39–117)
BUN: 45 mg/dL — ABNORMAL HIGH (ref 6–23)
CO2: 20 mEq/L (ref 19–32)
Calcium: 9.9 mg/dL (ref 8.4–10.5)
Chloride: 108 mEq/L (ref 96–112)
Creatinine, Ser: 1.84 mg/dL — ABNORMAL HIGH (ref 0.40–1.20)
GFR: 31.73 mL/min — ABNORMAL LOW (ref >60.00)
Glucose, Bld: 111 mg/dL — ABNORMAL HIGH (ref 70–99)
Potassium: 3.7 mEq/L (ref 3.5–5.1)
Sodium: 137 mEq/L (ref 135–145)
Total Bilirubin: 0.8 mg/dL (ref 0.2–1.2)
Total Protein: 6.1 g/dL (ref 6.0–8.3)

## 2021-09-21 NOTE — Telephone Encounter (Signed)
Patient has not had a bowel movement today to be able to do the stool sample.  She asked if she could do the X-ray and stool sample next week on Monday or Tuesday.  Please call and advise.  Thank you.

## 2021-09-21 NOTE — Progress Notes (Signed)
Patient ID: [name] [name], female   DOB: [birth year], 50 y.o.   MRN: [id number] HPI: [name] Locken is a 50 year old female with a history of triple positive breast cancer with metastasis (currently being treated with chemotherapy denosumab/Xgeva and retacrit, Enhertu), history of hypertension who is seen in consult at the request of Dr. Jana Hakim as well as Marda Stalker, PAC to evaluate epigastric pain and abnormal PET scan.  She is here alone today.  She reports that over the last several weeks she has developed upper abdominal pain.  She reports this feels like "trapped gas".  She is not sure that it is related or worsens with food.  She has not had nausea or vomiting.  She for the last week has had looser stools 5-6 times per day.  She reports stools as dark brown in color but without blood or melena.  She is having 5-6 bowel movements per day where previously her bowel movements were regular.  Her last chemotherapy was around 09/10/2021.  She denies frequent heartburn.  She has been using tramadol for pain as well as Gas-X.  She has used Imodium A-D for the looser stools.  She does have an abdominal bloating sensation.  She is still able to eat without early satiety.  No previous endoscopic evaluation from a GI perspective.  No family history of GI tract malignancy.  See below regarding abnormal PET scans.  Past Medical History:  Diagnosis Date   Anxiety    Breast cancer, left breast ([place]) 2000   Underwent lumpectomy, chemotherapy and radiation   Facial paralysis/Bells palsy    left side   Family history of lung cancer    Family history of lymphoma    Hypertension    Neuropathy    IN FINGERS AND TOES   RECENT INFUSION OF CHEMO   Personal history of breast cancer 05/06/2018   Renal disorder    just after TIA acute kidney injury   TIA (transient ischemic attack) 03/16/2017   [facility]    Past Surgical History:  Procedure Laterality Date   BREAST LUMPECTOMY WITH AXILLARY LYMPH  NODE BIOPSY Left 2000   biopsy   BREAST SURGERY     partial mastectomy with lymph node removal   ENDOBRONCHIAL ULTRASOUND Bilateral 09/05/2020   Procedure: ENDOBRONCHIAL ULTRASOUND;  Surgeon: Collene Gobble, MD;  Location: [facility];  Service: Cardiopulmonary;  Laterality: Bilateral;   MASTECTOMY Right 2019   & partial mastectomy left breast 2000   MASTECTOMY MODIFIED RADICAL Right 08/26/2018   Procedure: MASTECTOMY MODIFIED RADICAL;  Surgeon: Jovita Kussmaul, MD;  Location: [place];  Service: General;  Laterality: Right;   MODIFIED MASTECTOMY Right 08/26/2018   PORTACATH PLACEMENT Left 04/08/2018   Procedure: INSERTION PORT-A-CATH;  Surgeon: Jovita Kussmaul, MD;  Location: [place];  Service: General;  Laterality: Left;   TUBAL LIGATION Bilateral 2004   VIDEO BRONCHOSCOPY N/A 09/05/2020   Procedure: VIDEO BRONCHOSCOPY WITHOUT FLUORO;  Surgeon: Collene Gobble, MD;  Location: [facility];  Service: Cardiopulmonary;  Laterality: N/A;   WISDOM TOOTH EXTRACTION      Outpatient Medications Prior to Visit  Medication Sig Dispense Refill   albuterol (VENTOLIN HFA) 108 (90 Base) MCG/ACT inhaler Inhale 2 puffs into the lungs every 6 (six) hours as needed for wheezing or shortness of breath. 8 g 2   Ascorbic Acid (VITAMIN C) 1000 MG tablet Take 1,000 mg by mouth daily.     carvedilol (COREG) 3.125 MG tablet Take 3.125 mg by mouth 2 (two)  times daily with a meal.     Cyanocobalamin (VITAMIN B12) 500 MCG TABS Take 500 mcg by mouth daily.      dexamethasone (DECADRON) 4 MG tablet Take 2 tablets (8 mg total) by mouth daily. Start the day after chemotherapy for 2 days. 30 tablet 1   ferrous sulfate 325 (65 FE) MG tablet Take 1 tablet (325 mg total) by mouth daily. 30 tablet 0   gabapentin (NEURONTIN) 300 MG capsule Take one tablet in the morning, one tablet early afternoon, and two tablets at bedtime 120 capsule 1   guaiFENesin (ROBITUSSIN) 100 MG/5ML liquid Take 200 mg by mouth 3 (three) times daily as  needed for cough.     lidocaine-prilocaine (EMLA) cream Apply to affected area once 30 g 3   Multiple Vitamin (MULITIVITAMIN WITH MINERALS) TABS Take 1 tablet by mouth daily.     prochlorperazine (COMPAZINE) 10 MG tablet Take 1 tablet (10 mg total) by mouth every 6 (six) hours as needed (Nausea or vomiting). 30 tablet 1   traMADol (ULTRAM) 50 MG tablet Take 1-2 tablets (50-100 mg total) by mouth every 6 (six) hours as needed. 60 tablet 0   valACYclovir (VALTREX) 1000 MG tablet Take 1 tablet (1,000 mg total) by mouth 3 (three) times daily. 21 tablet 0   venlafaxine XR (EFFEXOR-XR) 37.5 MG 24 hr capsule TAKE 1 CAPSULE BY MOUTH DAILY WITH BREAKFAST. 90 capsule 1   sodium bicarbonate 325 MG tablet Take 1 tablet (325 mg total) by mouth 4 (four) times daily.     No facility-administered medications prior to visit.    Allergies  Allergen Reactions   Ace Inhibitors Swelling    Swelling of lips and face   Lisinopril Swelling    Swelling of lips, face   Amlodipine Swelling    Leg swelling   Shellfish Allergy Swelling   Adhesive [Tape] Itching and Rash    Please use paper tape   Latex Itching and Rash    Family History  Problem Relation Age of Onset   Hypertension Mother    Hypertension Father    Diverticulosis Father    Diabetes Father    Lung cancer Father 38       hx smoking   Hypertension Maternal Grandmother    CAD Maternal Grandmother    Alzheimer's disease Maternal Grandmother        died at 44   Hypertension Paternal Grandmother    CAD Paternal Grandmother    Stroke Paternal Grandfather    Sickle cell trait Paternal Grandfather    Pneumonia Maternal Aunt        died at a young adult   Lymphoma Paternal Aunt 74    Social History   Tobacco Use   Smoking status: Never   Smokeless tobacco: Never  Vaping Use   Vaping Use: Never used  Substance Use Topics   Alcohol use: No   Drug use: No    ROS: As per history of present illness, otherwise negative  BP 110/64    Pulse 80   Ht 4\' 11"  (1.499 m)   Wt 166 lb (75.3 kg)   LMP 12/21/2017   BMI 33.53 kg/m  Gen: awake, alert, NAD HEENT: anicteric CV: RRR, no mrg Pulm: CTA b/l Abd: soft, epigastric pain, mildly distended,  +BS throughout Ext: no c/c/e Neuro: nonfocal   RELEVANT LABS AND IMAGING: CBC    Component Value Date/Time   WBC 6.4 09/20/2021 1723   RBC 2.44 Repeated and verified X2. (L)  09/20/2021 1723   HGB 7.8 Repeated and verified X2. (LL) 09/20/2021 1723   HGB 7.3 (L) 05/30/2021 0917   HCT 23.5 Repeated and verified X2. (LL) 09/20/2021 1723   PLT 78.0 (L) 09/20/2021 1723   PLT 107 (L) 05/30/2021 0917   MCV 96.3 09/20/2021 1723   MCH 31.4 09/10/2021 0847   MCHC 33.1 09/20/2021 1723   RDW 21.3 (H) 09/20/2021 1723   LYMPHSABS 0.8 09/20/2021 1723   MONOABS 0.6 09/20/2021 1723   EOSABS 0.1 09/20/2021 1723   BASOSABS 0.0 09/20/2021 1723    CMP     Component Value Date/Time   NA 137 09/20/2021 1723   K 3.7 09/20/2021 1723   CL 108 09/20/2021 1723   CO2 20 09/20/2021 1723   GLUCOSE 111 (H) 09/20/2021 1723   BUN 45 (H) 09/20/2021 1723   CREATININE 1.84 (H) 09/20/2021 1723   CREATININE 2.77 (H) 05/30/2021 0917   CALCIUM 9.9 09/20/2021 1723   CALCIUM 11.2 (H) 02/24/2020 0830   PROT 6.1 09/20/2021 1723   ALBUMIN 3.3 (L) 09/20/2021 1723   AST 37 09/20/2021 1723   AST 96 (H) 05/30/2021 0917   ALT 53 (H) 09/20/2021 1723   ALT 56 (H) 05/30/2021 0917   ALKPHOS 106 09/20/2021 1723   BILITOT 0.8 09/20/2021 1723   BILITOT 1.8 (H) 05/30/2021 0917   GFRNONAA 41 (L) 09/10/2021 0847   GFRNONAA 20 (L) 05/30/2021 0917   GFRAA 41 (L) 08/30/2020 0808   GFRAA 45 (L) 07/31/2020 1400   NUCLEAR MEDICINE PET SKULL BASE TO THIGH   TECHNIQUE: 8.4 mCi F-18 FDG was injected intravenously. Full-ring PET imaging was performed from the skull base to thigh after the radiotracer. CT data was obtained and used for attenuation correction and anatomic localization.   Fasting blood glucose: 88  mg/dl   COMPARISON:  None.   FINDINGS: Mediastinal blood pool activity: SUV max 2.7   Liver activity: SUV max NA   NECK: New hypermetabolic LEFT supraclavicular node measures only 6 mm (image 40) but has intense metabolic activity SUV max equal 7.4.   Incidental CT findings: none   CHEST: Progression mediastinal adenopathy. Example prevascular node measures 16 mm compared to 9 mm with SUV max equal 14.2 compared SUV max equal 8.3 (image 68).   New subcarinal lymph node along the RIGHT bronchus intermedius measures 12 mm with SUV max equal 14.6.   Similar hypermetabolic RIGHT hilar node with SUV max equal 15 compared SUV max equal 16.3.   Incidental CT findings: No suspicious pulmonary nodules. Port in the anterior chest wall with tip in distal SVC.   ABDOMEN/PELVIS: Hypermetabolic activity through the gastric pyloric region not changed from prior. No evidence of liver metastasis.   No hypermetabolic abdominopelvic lymph nodes. Metabolic activity at the terminal ileum is typically physiologic.   Incidental CT findings: none   SKELETON: Intense metabolic activity associated with a RIGHT acetabular sclerotic lesion measuring 10 mm (158) with SUV max equal 16.8 compared SUV max equal 8.1. No change in size.   Additional sclerotic lesions scattered through the spine not have metabolic activity. Lesions not changed CT parents from prior.   Incidental CT findings: none   IMPRESSION: 1. Progression metastatic disease in the central thorax with a new hypermetabolic LEFT supraclavicular node in new hypermetabolic subcarinal node. Prevascular nodes are increased in size and metabolic activity. 2. Stable hypermetabolic RIGHT hilar node. 3. 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 Additional sclerotic skeletal lesions are  unchanged.     Electronically Signed   By: Suzy Bouchard M.D.   On: 07/17/2021  11:01    ABDOMEN ULTRASOUND COMPLETE   COMPARISON:  CT abdomen pelvis 07/17/2021   FINDINGS: Gallbladder: No gallstones or wall thickening visualized. No sonographic Murphy sign noted by sonographer.   Common bile duct: Diameter: 0.7 cm, greater than expected for age.   Liver: No focal lesion identified. Within normal limits in parenchymal echogenicity. Portal vein is patent on color Doppler imaging with normal direction of blood flow towards the liver.   IVC: No abnormality visualized.   Pancreas: Visualized portion unremarkable.   Spleen: Size and appearance within normal limits.   Right Kidney: Length: 8.6 cm. Echogenicity within normal limits. No mass or hydronephrosis visualized.   Left Kidney: Length: 9.2 cm. Echogenicity within normal limits. No mass or hydronephrosis visualized.   Abdominal aorta: No aneurysm visualized.   Other findings: None.   IMPRESSION: 1.  Normal sonographic appearance of the gallbladder.   2. The common bile duct measures 0.7 cm, slightly greater than expected for age. If there is clinical concern for biliary duct obstruction consider MRI/MRCP.     Electronically Signed   By: Audie Pinto M.D.   On: 09/15/2021 13:54    ASSESSMENT/PLAN: 50 year old female with a history of triple positive breast cancer with metastasis (currently being treated with chemotherapy denosumab/Xgeva and retacrit, Enhertu), history of hypertension who is seen in consult at the request of Dr. Jana Hakim as well as Marda Stalker, PAC to evaluate epigastric pain and abnormal PET scan.  Upper abdominal pain/abdominal bloating/abnormal PET scan with hypermetabolic activity in the gastric pylorus --her symptoms of upper abdominal pain and bloating have been present now for several weeks though the hypermetabolism in the gastric pylorus has been present for several months.  At this point her symptoms do not seem to be gastric outlet obstruction as she is not  having nausea nor vomiting.  She is clearly tender in the epigastrium.  Her ultrasound showed a dilated bile duct but no gallstones.  She has had recent liver enzymes though I would like to repeat those today to ensure there is no clear obstruction.  I recommended the following based on her clinical history and abnormal imaging: --Begin pantoprazole 40 mg once daily; in the event this is gastroduodenitis or even ulcer --CBC, CMP --MRCP to rule out biliary duct stones; there is no evidence of hepatic metastasis by last PET scan --Upper endoscopy in the [place]; we reviewed the risk, benefits and alternatives and she is agreeable and wishes to proceed --Abdominal x-ray today to rule out overt gaseous distention of the stomach  2.  Loose stools/diarrhea --about a week worth of diarrhea, possibly medication induced. -- GI pathogen panel      Cc:Marda Stalker, [place],   [id number]

## 2021-09-21 NOTE — Telephone Encounter (Signed)
Called patient back, she lives a ways away and wants to come in for the X-ray the same time as when she brings her stool sample. She denies her abdomin hurting today. I asked her to go to Urgent Care or ER if sever abdominal pain returns over the weekend. She agreed.

## 2021-09-24 ENCOUNTER — Other Ambulatory Visit: Payer: BC Managed Care – PPO

## 2021-09-24 ENCOUNTER — Ambulatory Visit: Payer: BC Managed Care – PPO | Admitting: Oncology

## 2021-09-27 ENCOUNTER — Ambulatory Visit (INDEPENDENT_AMBULATORY_CARE_PROVIDER_SITE_OTHER)
Admission: RE | Admit: 2021-09-27 | Discharge: 2021-09-27 | Disposition: A | Discharge status: Home / Self Care | Payer: BC Managed Care – PPO | Source: Ambulatory Visit | Attending: Internal Medicine | Admitting: Internal Medicine

## 2021-09-27 ENCOUNTER — Other Ambulatory Visit: Payer: Self-pay

## 2021-09-27 ENCOUNTER — Other Ambulatory Visit: Payer: BC Managed Care – PPO

## 2021-09-27 ENCOUNTER — Ambulatory Visit (HOSPITAL_COMMUNITY): Payer: BC Managed Care – PPO

## 2021-09-27 DIAGNOSIS — R1013 Epigastric pain: Secondary | ICD-10-CM | POA: Diagnosis not present

## 2021-09-27 DIAGNOSIS — R109 Unspecified abdominal pain: Secondary | ICD-10-CM | POA: Diagnosis not present

## 2021-09-27 DIAGNOSIS — R935 Abnormal findings on diagnostic imaging of other abdominal regions, including retroperitoneum: Secondary | ICD-10-CM

## 2021-09-27 DIAGNOSIS — C50919 Malignant neoplasm of unspecified site of unspecified female breast: Secondary | ICD-10-CM

## 2021-09-28 ENCOUNTER — Other Ambulatory Visit: Payer: Self-pay | Admitting: Internal Medicine

## 2021-09-28 ENCOUNTER — Ambulatory Visit (HOSPITAL_COMMUNITY)
Admission: RE | Admit: 2021-09-28 | Discharge: 2021-09-28 | Disposition: A | Discharge status: Home / Self Care | Payer: BC Managed Care – PPO | Source: Ambulatory Visit | Attending: Internal Medicine | Admitting: Internal Medicine

## 2021-09-28 DIAGNOSIS — C50919 Malignant neoplasm of unspecified site of unspecified female breast: Secondary | ICD-10-CM | POA: Diagnosis not present

## 2021-09-28 DIAGNOSIS — K862 Cyst of pancreas: Secondary | ICD-10-CM | POA: Diagnosis not present

## 2021-09-28 DIAGNOSIS — R1013 Epigastric pain: Secondary | ICD-10-CM | POA: Diagnosis not present

## 2021-09-28 DIAGNOSIS — R935 Abnormal findings on diagnostic imaging of other abdominal regions, including retroperitoneum: Secondary | ICD-10-CM

## 2021-09-28 MED ORDER — GADOBUTROL 1 MMOL/ML IV SOLN
8.5000 mL | Freq: Once | INTRAVENOUS | Status: AC | PRN
  Administered 2021-09-28: 8.5 mL via INTRAVENOUS

## 2021-09-28 MED FILL — Dexamethasone Sodium Phosphate Inj 100 MG/10ML: INTRAMUSCULAR | Qty: 1 | Status: AC

## 2021-09-30 NOTE — Progress Notes (Addendum)
[place]  [phone] [phone]    ID: [name] [name] DOB: Oct 07, 1971  MR#: [id number]  NFA#:[id number]  Patient Care Team: Marda Stalker, PA-C as PCP - General (Family Medicine) Jerline Pain, MD as PCP - Cardiology (Cardiology) Karmen Altamirano, Virgie Dad, MD as Consulting Physician (Oncology) Loreta Ave, MD as Referring Physician (Internal Medicine) Kyung Rudd, MD as Consulting Physician (Radiation Oncology) Jovita Kussmaul, MD as Consulting Physician (General Surgery) Larey Dresser, MD as Consulting Physician (Cardiology) Mauro Kaufmann, RN as Oncology Nurse Navigator Rockwell Germany, RN as Oncology Nurse Navigator Raina Mina, RPH-CPP (Pharmacist) Murlean Iba, MD (Nephrology) OTHER MD:   CHIEF COMPLAINT: Triple positive breast cancer (s/p right mastectomy)  CURRENT TREATMENT: Enhertu; fulvestrant; denosumab/Xgeva; retacrit   INTERVAL HISTORY: [name] returns today for follow up and treatment of her triple positive breast cancer.    Since her last visit, she underwent abdomen ultrasound on 09/13/2021 showing: normal appearance of gallbladder; common bile duct measures 0.7 cm, slightly greater than expected for age.  She was seen by Dr. Hilarie Fredrickson in GI on 09/20/2021. She underwent abdomen x-ray showing: no acute process; moderate retained fecal material in colon. She was also started on pantoprazole.  She also underwent abdomen MRI on 09/28/2021. 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 Recommend continued follow-up by MRI in 1 year.  There is no evidence of biliary ductal dilatation, choledocholithiasis, or other acute findings.   She is scheduled for endoscopy on 10/18/2021.  She was started on Enhertu and fulvestrant, both given every 3 weeks, on 07/23/2021.  (She also received denosumab/Xgeva and retacrit the same day.)  She  will receive her fourth dose of Enhertu today.  Her most recent echo was 07/19/2021 and repeat will be due in November.  She is receiving fulvestrant every 3 weeks, with the dose adjusted to account for the altered dosing interval.  She was switched back to [place] on 07/23/2021, now given every 6 weeks.  Her most recent treatment was 08/20/2021 and her next treatment will be today 10/01/2021  She has met with nephrology (Dr.Singh).  She was started on Retacrit here for her anemia of renal failure 06/11/2021.  Her dose was increased to 40,000 units 07/02/2021.  She receives this depending on her hemoglobin, scheduled every 3 weeks to coincide with her anti-HER2 treatment.  Her most recent transfusion was on 08/23/2021.   REVIEW OF SYSTEMS: [name] is surprisingly symptom-free despite her severe anemia.  She has been doing some line dancing.  She denies shortness of breath.  She does say she is very fatigued.  She is having some TMJ symptoms.  Otherwise she is tolerating the Enhertu remarkably well and specifically there are currently no symptoms suggestive of pancreatitis.   COVID 19 VACCINATION STATUS: [place] x2, most recently 03/2020; infection 04/2021   HISTORY OF CURRENT ILLNESS: From the original intake note:  [name] [name] palpated a lump on 01/15/2018. She felt soreness and shooting pain under her arm and in the right breast. She underwent bilateral diagnostic mammography with tomography and right breast ultrasonography at [facility] on 03/12/2018 showing: breast density category C. The is architectural distortion at the 11 o'clock position. There is also an oval mass in the right breast anterior depth. Examination of the right axilla showed enlarged lymph nodes. Ultrasonography showed a 4.6 cm mass in the right breast upper outer quadrant posterior depth. An additional  1.2 cm oval mass in the right breast 12 o'clock middle depth. The lymph nodes in the right axillary are highly suggestive of  malignancy.   Accordingly on 03/16/2018 she proceeded to biopsy of the right breast area in question. The pathology from this procedure showed ([id number]): At both the 11 and 12 o'clock positions: Invasive ductal carcinoma grade II. Ductal carcinoma in situ, high grade, with necrosis and calcifications. Prognostic indicators significant for: estrogen receptor, 90% positive and progesterone receptor, 40% positive, both with strong staining intensity. Proliferation marker Ki67 at 80%. HER2 amplified with ratios HER2/CEP17 SIGNALS 6.90 and average HER2 copies per cell 14.50  The patient's subsequent history is as detailed below.   PAST MEDICAL HISTORY: Past Medical History:  Diagnosis Date   Anxiety    Breast cancer, left breast ([place]) 2000   Underwent lumpectomy, chemotherapy and radiation   Facial paralysis/Bells palsy    left side   Family history of lung cancer    Family history of lymphoma    Hypertension    Neuropathy    IN FINGERS AND TOES   RECENT INFUSION OF CHEMO   Personal history of breast cancer 05/06/2018   Renal disorder    just after TIA acute kidney injury   TIA (transient ischemic attack) 03/16/2017   [facility]  The patient has a previous history of left breast cancer diagnosed in 2000. She was seen by [facility] in [place], [place] under Dr. Loreta Ave. According to the patient, her left breast cancer was stage II with no lymph node involvement (likely T2N0). She had chemotherapy every 3 weeks for about 6 months. She did not takes any "red drugs." She also did not take anti-estrogens (likely ER/PR negative).   TIA in 2018. She saw a neurologist as a precaution. She denies any clotting issues.    PAST SURGICAL HISTORY: Past Surgical History:  Procedure Laterality Date   BREAST LUMPECTOMY WITH AXILLARY LYMPH NODE BIOPSY Left 2000   biopsy   BREAST SURGERY     partial mastectomy with lymph node removal   ENDOBRONCHIAL ULTRASOUND Bilateral  09/05/2020   Procedure: ENDOBRONCHIAL ULTRASOUND;  Surgeon: Collene Gobble, MD;  Location: [facility];  Service: Cardiopulmonary;  Laterality: Bilateral;   MASTECTOMY Right 2019   & partial mastectomy left breast 2000   MASTECTOMY MODIFIED RADICAL Right 08/26/2018   Procedure: MASTECTOMY MODIFIED RADICAL;  Surgeon: Jovita Kussmaul, MD;  Location: [facility];  Service: General;  Laterality: Right;   MODIFIED MASTECTOMY Right 08/26/2018   PORTACATH PLACEMENT Left 04/08/2018   Procedure: INSERTION PORT-A-CATH;  Surgeon: Jovita Kussmaul, MD;  Location: [facility];  Service: General;  Laterality: Left;   TUBAL LIGATION Bilateral 2004   VIDEO BRONCHOSCOPY N/A 09/05/2020   Procedure: VIDEO BRONCHOSCOPY WITHOUT FLUORO;  Surgeon: Collene Gobble, MD;  Location: [facility];  Service: Cardiopulmonary;  Laterality: N/A;   WISDOM TOOTH EXTRACTION      FAMILY HISTORY Family History  Problem Relation Age of Onset   Hypertension Mother    Hypertension Father    Diverticulosis Father    Diabetes Father    Lung cancer Father 79       hx smoking   Hypertension Maternal Grandmother    CAD Maternal Grandmother    Alzheimer's disease Maternal Grandmother        died at 66   Hypertension Paternal Grandmother    CAD Paternal Grandmother    Stroke Paternal Grandfather    Sickle cell trait Paternal Grandfather  Pneumonia Maternal Aunt        died at a young adult   Lymphoma Paternal Aunt 45  The patient's father is alive at age 84 and has a history of lung cancer. The patient's mother is alive at age 79. The patient has 1 brother and no sisters. She denies a family history of breast or ovarian cancer.     GYNECOLOGIC HISTORY:  Patient's last menstrual period was 12/21/2017. Menarche: 50 years old Age at first live birth: 50 years old She is GXP3.  Her LMP was  January 2019.  The patient used oral contraceptive from [phone] and the Depo-provera shot from [phone] with no complications. She never used HRT.   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  She also worked at [employer] as a Occupational psychologist.  She is now disabled.  Her husband, [name], is a Administrator. The patient's son, Shea Stakes age 33, works at [employer] in [place]. The patient's daughters, Lilia Pro age 6, and Levonne Spiller age 24, are students. Lilia Pro plans to attend [employer] in the fall. The patient's adopted son, Vonna Kotyk age 108, is also a Ship broker.  He had to go back to foster care in 2022 because of behavioral disturbances.  ADVANCED DIRECTIVES: In the absence of any documents to the contrary the patient's husband is her healthcare power of attorney   HEALTH MAINTENANCE: Social History   Tobacco Use   Smoking status: Never   Smokeless tobacco: Never  Vaping Use   Vaping Use: Never used  Substance Use Topics   Alcohol use: No   Drug use: No     Colonoscopy: n/a  PAP: 2015  Bone density: never done   Allergies  Allergen Reactions   Ace Inhibitors Swelling    Swelling of lips and face   Lisinopril Swelling    Swelling of lips, face   Amlodipine Swelling    Leg swelling   Shellfish Allergy Swelling   Adhesive [Tape] Itching and Rash    Please use paper tape   Latex Itching and Rash    Current Outpatient Medications  Medication Sig Dispense Refill   albuterol (VENTOLIN HFA) 108 (90 Base) MCG/ACT inhaler Inhale 2 puffs into the lungs every 6 (six) hours as needed for wheezing or shortness of breath. 8 g 2   Ascorbic Acid (VITAMIN C) 1000 MG tablet Take 1,000 mg by mouth daily.     carvedilol (COREG) 3.125 MG tablet Take 3.125 mg by mouth 2 (two) times daily with a meal.     Cyanocobalamin (VITAMIN B12) 500 MCG TABS Take 500 mcg by mouth daily.      dexamethasone (DECADRON) 4 MG tablet Take 2 tablets (8 mg total) by mouth daily. Start the day after chemotherapy for 2 days. 30 tablet 1   ferrous sulfate 325 (65  FE) MG tablet Take 1 tablet (325 mg total) by mouth daily. 30 tablet 0   gabapentin (NEURONTIN) 300 MG capsule Take one tablet in the morning, one tablet early afternoon, and two tablets at bedtime 120 capsule 1   guaiFENesin (ROBITUSSIN) 100 MG/5ML liquid Take 200 mg by mouth 3 (three) times daily as needed for cough.     lidocaine-prilocaine (EMLA) cream Apply to affected area once 30 g 3   Multiple Vitamin (MULITIVITAMIN WITH MINERALS) TABS Take 1 tablet by mouth daily.     pantoprazole (PROTONIX) 40 MG tablet  Take 1 tablet (40 mg total) by mouth daily. 90 tablet 1   prochlorperazine (COMPAZINE) 10 MG tablet Take 1 tablet (10 mg total) by mouth every 6 (six) hours as needed (Nausea or vomiting). 30 tablet 1   traMADol (ULTRAM) 50 MG tablet Take 1-2 tablets (50-100 mg total) by mouth every 6 (six) hours as needed. 60 tablet 0   valACYclovir (VALTREX) 1000 MG tablet Take 1 tablet (1,000 mg total) by mouth 3 (three) times daily. 21 tablet 0   venlafaxine XR (EFFEXOR-XR) 37.5 MG 24 hr capsule TAKE 1 CAPSULE BY MOUTH DAILY WITH BREAKFAST. 90 capsule 1   No current facility-administered medications for this visit.   Facility-Administered Medications Ordered in Other Visits  Medication Dose Route Frequency Provider Last Rate Last Admin   sodium chloride flush (NS) 0.9 % injection 10 mL  10 mL Intravenous PRN Cedra Villalon, Virgie Dad, MD   10 mL at 10/01/21 0908    OBJECTIVE: African-American woman who appears stated age  50:   10/01/21 0941  BP: (!) 141/71  Pulse: 89  Resp: 18  Temp: 97.8 F (36.6 C)  SpO2: 99%    Wt Readings from Last 3 Encounters:  10/01/21 173 lb 8 oz (78.7 kg)  09/20/21 166 lb (75.3 kg)  09/10/21 165 lb 3.2 oz (74.9 kg)   Body mass index is 35.04 kg/m.    ECOG FS:2 - Symptomatic, <50% confined to bed  Sclerae unicteric, EOMs intact Wearing a mask No cervical or supraclavicular adenopathy Lungs no rales or rhonchi Heart regular rate and rhythm Abd soft,  nontender, positive bowel sounds MSK no focal spinal tenderness, no upper extremity lymphedema Neuro: nonfocal, well oriented, appropriate affect Breasts: The right breast is status post mastectomy.  There is no evidence of chest wall recurrence.  The left breast is benign.  Both axillae are benign.   LAB RESULTS:  CMP     Component Value Date/Time   NA 139 10/01/2021 0909   K 3.9 10/01/2021 0909   CL 113 (H) 10/01/2021 0909   CO2 19 (L) 10/01/2021 0909   GLUCOSE 170 (H) 10/01/2021 0909   BUN 38 (H) 10/01/2021 0909   CREATININE 2.42 (H) 10/01/2021 0909   CREATININE 2.77 (H) 05/30/2021 0917   CALCIUM 10.0 10/01/2021 0909   CALCIUM 11.2 (H) 02/24/2020 0830   PROT 5.6 (L) 10/01/2021 0909   ALBUMIN 2.5 (L) 10/01/2021 0909   AST 31 10/01/2021 0909   AST 96 (H) 05/30/2021 0917   ALT 18 10/01/2021 0909   ALT 56 (H) 05/30/2021 0917   ALKPHOS 112 10/01/2021 0909   BILITOT 0.7 10/01/2021 0909   BILITOT 1.8 (H) 05/30/2021 0917   GFRNONAA 24 (L) 10/01/2021 0909   GFRNONAA 20 (L) 05/30/2021 0917   GFRAA 41 (L) 08/30/2020 0808   GFRAA 45 (L) 07/31/2020 1400    Lab Results  Component Value Date   TOTALPROTELP 7.0 02/25/2019     Lab Results  Component Value Date   KPAFRELGTCHN 76.7 (H) 02/25/2019   LAMBDASER 27.2 (H) 02/25/2019   KAPLAMBRATIO 2.82 (H) 02/25/2019    Lab Results  Component Value Date   WBC 4.4 10/01/2021   NEUTROABS 2.6 10/01/2021   HGB 6.2 (LL) 10/01/2021   HCT 18.8 (L) 10/01/2021   MCV 97.9 10/01/2021   PLT 94 (L) 10/01/2021   No results found for: LABCA2  No components found for: URKYHC623  No results for input(s): INR in the last 168 hours.   No results found for: LABCA2  Lab Results  Component Value Date   XIP382 50 (H) 06/07/2021    No results found for: NLZ767  No results found for: HAL937  Lab Results  Component Value Date   CA2729 63.5 (H) 06/07/2021    No components found for: HGQUANT  No results found for: CEA1 / No results  found for: CEA1   No results found for: AFPTUMOR  No results found for: CHROMOGRNA  No results found for: HGBA, HGBA2QUANT, HGBFQUANT, HGBSQUAN (Hemoglobinopathy evaluation)   No results found for: LDH  No results found for: IRON, TIBC, IRONPCTSAT (Iron and TIBC)  Lab Results  Component Value Date   FERRITIN 104 08/20/2021    Urinalysis    Component Value Date/Time   COLORURINE YELLOW 05/30/2021 1153   APPEARANCEUR CLEAR 05/30/2021 1153   LABSPEC 1.010 05/30/2021 1153   PHURINE 5.0 05/30/2021 1153   GLUCOSEU NEGATIVE 05/30/2021 1153   HGBUR SMALL (A) 05/30/2021 1153   BILIRUBINUR NEGATIVE 05/30/2021 [place] 05/30/2021 1153   PROTEINUR 30 (A) 05/30/2021 1153   UROBILINOGEN 0.2 11/29/2011 1656   NITRITE NEGATIVE 05/30/2021 1153   LEUKOCYTESUR NEGATIVE 05/30/2021 1153    STUDIES: US Abdomen Complete  Result Date: 09/15/2021 CLINICAL DATA:  Evaluate for cholecystitis EXAM: ABDOMEN ULTRASOUND COMPLETE COMPARISON:  CT abdomen pelvis 07/17/2021 FINDINGS: Gallbladder: No gallstones or wall thickening visualized. No sonographic Murphy sign noted by sonographer. Common bile duct: Diameter: 0.7 cm, greater than expected for age. Liver: No focal lesion identified. Within normal limits in parenchymal echogenicity. Portal vein is patent on color Doppler imaging with normal direction of blood flow towards the liver. IVC: No abnormality visualized. Pancreas: Visualized portion unremarkable. Spleen: Size and appearance within normal limits. Right Kidney: Length: 8.6 cm. Echogenicity within normal limits. No mass or hydronephrosis visualized. Left Kidney: Length: 9.2 cm. Echogenicity within normal limits. No mass or hydronephrosis visualized. Abdominal aorta: No aneurysm visualized. Other findings: None. IMPRESSION: 1.  Normal sonographic appearance of the gallbladder. 2. The common bile duct measures 0.7 cm, slightly greater than expected for age. If there is clinical concern  for biliary duct obstruction consider MRI/MRCP. Electronically Signed   By: Audie Pinto M.D.   On: 09/15/2021 13:54   DG Abd 2 Views  Result Date: 09/28/2021 CLINICAL DATA:  Abdominal pain EXAM: ABDOMEN - 2 VIEW COMPARISON:  Abdominal x-ray 07/31/2020 FINDINGS: Bowel-gas pattern appears within normal limits. No abnormally distended gas-filled loops of bowel to suggest obstruction. No evidence of free air. Moderate amount of retained fecal material throughout the colon. Surgical clips in the pelvis. No suspicious calcifications identified. IMPRESSION: No acute process identified. Moderate retained fecal material in the colon. Electronically Signed   By: Ofilia Neas M.D.   On: 09/28/2021 16:53     ELIGIBLE FOR AVAILABLE RESEARCH PROTOCOL: no   ASSESSMENT: 50 y.o. [place], [place] woman  (1) status post left lumpectomy in 2000 for a (?) T2N0 breast cancer,  (a) status post adjuvant chemotherapy  (b) status post adjuvant radiation  (c) did not receive antiestrogens  (2) genetics testing May 18, 2018 through the common Hereditary Cancers Panel + Myelodysplastic Syndrome/Leukemia Panel found no deleterious mutations in APC, ATM, AXIN2, BARD1, BLM, BMPR1A, BRCA1, BRCA2, BRIP1, CDH1, CDK4, CDKN2A (p14ARF), CDKN2A (p16INK4a), CEBPA, CHEK2, CTNNA1, DICER1, EPCAM*, GATA2, GREM1*, HRAS, KIT, MEN1, MLH1, MSH2, MSH3, MSH6, MUTYH, NBN, NF1, PALB2, PDGFRA, PMS2, POLD1, POLE, PTEN, RAD50, RAD51C, RAD51D, RUNX1, SDHB, SDHC, SDHD, SMAD4, SMARCA4, STK11, TERC, TERT, TP53, TSC1, TSC2, VHL The following genes were  evaluated for sequence changes only: HOXB13*, NTHL1*, SDHA  (a)  3 variants of uncertain significance were identified in the genes BARD1 c.764A>G (p.Asn255Ser), BRIP1 c.2563C>T (p.Arg855Cys), and PALB2 c.3103A>T (p.Ile1035Phe).   (3) status post right breast biopsy 03/16/2018 for a clinical mT2-3 N2, anatomic stage III invasive ductal carcinoma, grade 2, triple positive, with an MIB-1 of 80%.  (a)  bone scan and chest CT scan negative for metastases except for a T6 lytic lesion of concern  (b) thoracic spine MRI was ordered May 2019 but never performed.  (4) neoadjuvant chemotherapy consisting of carboplatin, docetaxel, trastuzumab and Pertuzumab every 3 weeks x 6 starting 04/16/2018  (a) pertuzumab held beginning with cycle 2 because of diarrhea  (5) trastuzumab was to be continued Q4w indefinitely, discontinued after 10/21/2019 dose with progression  (a) echocardiogram on 06/29/2018 showed an ejection fraction in the 65-70% range  (b) echocardiogram 11/19/2018 showed an ejection fraction in the 60-65% range  (c) echo 04/07/2019 shows an ejection fraction in the 55-60% range  (d) echo 07/15/2019 shows EF of 60-65%--for additional echoes see under #12 below  (6) right mastectomy and sentinel lymph node sampling on 08/26/2018 found a residual 2.5cm invasive ductal carcinoma, with 2 of 5 sentinel lymph nodes positive, ypT2, ypN1a; margins were clear  (a) repeat prognostic panel confirms tumor still triple positive (HER2 3+)  (7) adjuvant radiation 09/30/2018 - 11/16/2018  Site/dose:   The patient initially received a dose of 50.4 Gy in 28 fractions to the right chest wall and supraclavicular region. This was delivered using a 3-D conformal, 4 field technique. The patient then received a boost to the mastectomy scar. This delivered an additional 10 Gy in 5 fractions using an en face electron field. The total dose was 60.4   (8) anastrozole started 12/03/2018, discontinued August 2022 with progression  (a) [place] on 10/01/2018 was 64.3, and estradiol 7.0, consistent with menopause  (b) referral to pelvic floor rehabilitation 12/03/2018  (c) [place] on 03/16/2020 was 80.8 with estradiol less than 2.5; repeat 11/29/2021 was 70.1   METASTATIC DISEASE: March 2020 (9) CT of neck and CT angiography of the chest 02/04/2019 finds the T6 lytic lesion noted May 2019 (see #3 above) is now sclerotic; a  sclerotic T3 lesion is stable; there is a new lytic lesion in the manubrium  (a) PET scan 03/12/2019 shows manubrium, T3 and T5 metastases, no visceral disease  (b) biopsy of manubrial lesion planned but not done secondary to pandemic  (c) Bone scan 09/01/19 shows ? progression of disease in spine; MRI 09/02/19 shows early metastases in T10 (new), T3 and T5 stable; CT chest 09/01/19 shows 65mm pulmonary nodules that are new, but nonspecific and warrant future follow up  (d) PET 10/26/2019 shows no lung or liver lesions; new mediastinal adenopathy, bone lesions-- T-DM1 started  (e) PET scan 03/08/2020 shows resolution of the mediastinal adenopathy and hypermetabolic bone lesions, questionable subcutaneous nodule along the left lateral shoulder  (f) for additional staging studies see under "12" below  (10) SRS to spine, palliative treatment to sternum, from 05/13/2019 through 05/26/2019:  1. Chest_sternum // 40 Gy in 10 fractions  2. Thoracic Spine (T3) // 18 Gy in 1 fraction   3. Thoracic Spine (T5) // 18 Gy in 1 fraction   (11) denosumab/Xgeva started 09/08/2019, given every 6 weeks  (12) T-DM1 started 01/15/20201, discontinued after 07/02/2021 dose with disease progression  (a) echo 10/26/2019 shows stable EF at 55-60%  (b) echo 03/08/2020 shows an ejection fraction in the  70-75% range  (c) echo 07/12/2020 shows an ejection fraction in the 65 to 70% range.  (d) PET scan 03/08/2020 shows no active disease; a 0.9 cm subcutaneous nodule in the left shoulder area seen on the PET was not identifiable by exam 03/16/2020  (e) PET 08/24/2020 shows stable disease except for a new 0.6 cm lymph node with an SUV of 10.1  (f) bronchoscopy 09/05/2020 with endobronchial ultrasound found no detectable mass in the region noted on the September PET scan  (g) T-DM1 discontinued and trastuzumab resumed 11/29/2020  (h) PET scan 02/26/2021 shows mild nodular increase: TDM-1 resumed 03/07/2021, discontinued after  05/09/2021 dose pending restaging studies  (i) noncontrast chest CT 06/01/2021 shows possibly mildly enlarged adenopathy, and an apparently new sclerotic lesion at T8.  There was also evidence of pancreatitis and possible pneumonitis  (j) PET scan 07/17/2021 shows progression in lymph nodes and bone, but no evidence of involvement of lung or liver and no evidence of residual pancreatitis.  There is continued unexplained gastric pyloric hypermetabolic activity.  (13) Enhertu started 07/23/2021, repeated every 21 days  (14) fulvestrant Q 21 days (alternating 500/250 mg doses) started 07/23/2021  (15) anemia of renal failure:  (A) retacrit started 12/27/2020 at 10K unit every 21 days, dose gradually increased to current 40K units Q 21 days   PLAN: [name] will receive her fourth Enhertu dose today.  We need to restage her and I have written for her to have a PET scan mid November.  She also will be due for repeat echocardiogram and that order also has been entered.  Before she sees me again 3 weeks from now she will have had her EGD so we can figure out what it is in the gastroesophageal junction area that "lights up" on PET scan.  Also we will have the results of her MRI which have not been read yet.  At this point though she continues severely anemic.  We are going to continue the Retacrit as before.  Note most recent ferritin was 104 in mid September of this year  When she sees me again in 3 weeks she will have had her echo, her EGD, and her PET scan and we will be able to decide whether to continue the current treatment or switch.  I suggested she might want to bring her husband with her so he can participate in decision-making if she wishes.  Total encounter time 35 minutes.Sarajane Jews C. Les Longmore, MD 10/01/21 10:03 AM Medical Oncology and Hematology [facility] [place], [place] [address] Tel. [phone]    Fax. [phone]   I, Wilburn Mylar, am  acting as scribe for Dr. Virgie Dad. Kahleah Crass.  I, Lurline Del MD, have reviewed the above documentation for accuracy and completeness, and I agree with the above.   *Total Encounter Time as defined by the Centers for Medicare and Medicaid Services includes, in addition to the face-to-face time of a patient visit (documented in the note above) non-face-to-face time: obtaining and reviewing outside history, ordering and reviewing medications, tests or procedures, care coordination (communications with other health care professionals or caregivers) and documentation in the medical record.

## 2021-10-01 ENCOUNTER — Inpatient Hospital Stay: Payer: BC Managed Care – PPO

## 2021-10-01 ENCOUNTER — Telehealth: Payer: Self-pay | Admitting: *Deleted

## 2021-10-01 ENCOUNTER — Other Ambulatory Visit: Payer: Self-pay

## 2021-10-01 ENCOUNTER — Inpatient Hospital Stay (HOSPITAL_BASED_OUTPATIENT_CLINIC_OR_DEPARTMENT_OTHER): Payer: BC Managed Care – PPO | Admitting: Oncology

## 2021-10-01 ENCOUNTER — Other Ambulatory Visit: Payer: Self-pay | Admitting: *Deleted

## 2021-10-01 VITALS — BP 141/71 | HR 89 | Temp 97.8°F | Resp 18 | Ht 59.0 in | Wt 173.5 lb

## 2021-10-01 VITALS — BP 124/86 | HR 71 | Temp 97.9°F | Resp 20

## 2021-10-01 DIAGNOSIS — Z170 Estrogen receptor positive status [ER+]: Secondary | ICD-10-CM | POA: Diagnosis not present

## 2021-10-01 DIAGNOSIS — C50411 Malignant neoplasm of upper-outer quadrant of right female breast: Secondary | ICD-10-CM

## 2021-10-01 DIAGNOSIS — C7951 Secondary malignant neoplasm of bone: Secondary | ICD-10-CM | POA: Diagnosis not present

## 2021-10-01 DIAGNOSIS — K858 Other acute pancreatitis without necrosis or infection: Secondary | ICD-10-CM

## 2021-10-01 DIAGNOSIS — D631 Anemia in chronic kidney disease: Secondary | ICD-10-CM | POA: Diagnosis not present

## 2021-10-01 DIAGNOSIS — Z7189 Other specified counseling: Secondary | ICD-10-CM

## 2021-10-01 DIAGNOSIS — C50811 Malignant neoplasm of overlapping sites of right female breast: Secondary | ICD-10-CM | POA: Diagnosis not present

## 2021-10-01 DIAGNOSIS — D649 Anemia, unspecified: Secondary | ICD-10-CM

## 2021-10-01 DIAGNOSIS — Z8719 Personal history of other diseases of the digestive system: Secondary | ICD-10-CM | POA: Diagnosis not present

## 2021-10-01 DIAGNOSIS — N184 Chronic kidney disease, stage 4 (severe): Secondary | ICD-10-CM

## 2021-10-01 DIAGNOSIS — N19 Unspecified kidney failure: Secondary | ICD-10-CM | POA: Diagnosis not present

## 2021-10-01 DIAGNOSIS — Z95828 Presence of other vascular implants and grafts: Secondary | ICD-10-CM

## 2021-10-01 DIAGNOSIS — Z5111 Encounter for antineoplastic chemotherapy: Secondary | ICD-10-CM | POA: Diagnosis not present

## 2021-10-01 DIAGNOSIS — Z79899 Other long term (current) drug therapy: Secondary | ICD-10-CM | POA: Diagnosis not present

## 2021-10-01 DIAGNOSIS — I890 Lymphedema, not elsewhere classified: Secondary | ICD-10-CM

## 2021-10-01 DIAGNOSIS — Z6841 Body Mass Index (BMI) 40.0 and over, adult: Secondary | ICD-10-CM

## 2021-10-01 DIAGNOSIS — Z5112 Encounter for antineoplastic immunotherapy: Secondary | ICD-10-CM | POA: Diagnosis not present

## 2021-10-01 LAB — GI PROFILE, STOOL, PCR

## 2021-10-01 LAB — COMPREHENSIVE METABOLIC PANEL
ALT: 18 U/L (ref 0–44)
AST: 31 U/L (ref 15–41)
Albumin: 2.5 g/dL — ABNORMAL LOW (ref 3.5–5.0)
Alkaline Phosphatase: 112 U/L (ref 38–126)
Anion gap: 7 (ref 5–15)
BUN: 38 mg/dL — ABNORMAL HIGH (ref 6–20)
CO2: 19 mmol/L — ABNORMAL LOW (ref 22–32)
Calcium: 10 mg/dL (ref 8.9–10.3)
Chloride: 113 mmol/L — ABNORMAL HIGH (ref 98–111)
Creatinine, Ser: 2.42 mg/dL — ABNORMAL HIGH (ref 0.44–1.00)
GFR, Estimated: 24 mL/min — ABNORMAL LOW (ref >60)
Glucose, Bld: 170 mg/dL — ABNORMAL HIGH (ref 70–99)
Potassium: 3.9 mmol/L (ref 3.5–5.1)
Sodium: 139 mmol/L (ref 135–145)
Total Bilirubin: 0.7 mg/dL (ref 0.3–1.2)
Total Protein: 5.6 g/dL — ABNORMAL LOW (ref 6.5–8.1)

## 2021-10-01 LAB — RETICULOCYTES
Immature Retic Fract: 29.3 % — ABNORMAL HIGH (ref 2.3–15.9)
RBC.: 1.91 MIL/uL — ABNORMAL LOW (ref 3.87–5.11)
Retic Count, Absolute: 105.1 10*3/uL (ref 19.0–186.0)
Retic Ct Pct: 5.5 % — ABNORMAL HIGH (ref 0.4–3.1)

## 2021-10-01 LAB — CBC WITH DIFFERENTIAL/PLATELET
Abs Immature Granulocytes: 0.06 10*3/uL (ref 0.00–0.07)
Basophils Absolute: 0 10*3/uL (ref 0.0–0.1)
Basophils Relative: 1 %
Eosinophils Absolute: 0.2 10*3/uL (ref 0.0–0.5)
Eosinophils Relative: 3 %
HCT: 18.8 % — ABNORMAL LOW (ref 36.0–46.0)
Hemoglobin: 6.2 g/dL — CL (ref 12.0–15.0)
Immature Granulocytes: 1 %
Lymphocytes Relative: 16 %
Lymphs Abs: 0.7 10*3/uL (ref 0.7–4.0)
MCH: 32.3 pg (ref 26.0–34.0)
MCHC: 33 g/dL (ref 30.0–36.0)
MCV: 97.9 fL (ref 80.0–100.0)
Monocytes Absolute: 0.8 10*3/uL (ref 0.1–1.0)
Monocytes Relative: 19 %
Neutro Abs: 2.6 10*3/uL (ref 1.7–7.7)
Neutrophils Relative %: 60 %
Platelets: 94 10*3/uL — ABNORMAL LOW (ref 150–400)
RBC: 1.92 MIL/uL — ABNORMAL LOW (ref 3.87–5.11)
RDW: 21.4 % — ABNORMAL HIGH (ref 11.5–15.5)
WBC: 4.4 10*3/uL (ref 4.0–10.5)
nRBC: 0 % (ref 0.0–0.2)

## 2021-10-01 LAB — PREPARE RBC (CROSSMATCH)

## 2021-10-01 MED ORDER — HEPARIN SOD (PORK) LOCK FLUSH 100 UNIT/ML IV SOLN
500.0000 [IU] | Freq: Once | INTRAVENOUS | Status: AC | PRN
  Administered 2021-10-01: 500 [IU]

## 2021-10-01 MED ORDER — SODIUM CHLORIDE 0.9% FLUSH
10.0000 mL | INTRAVENOUS | Status: DC | PRN
  Administered 2021-10-01: 10 mL via INTRAVENOUS

## 2021-10-01 MED ORDER — DIPHENHYDRAMINE HCL 25 MG PO CAPS
25.0000 mg | ORAL_CAPSULE | Freq: Once | ORAL | Status: AC
  Administered 2021-10-01: 25 mg via ORAL
  Filled 2021-10-01: qty 1

## 2021-10-01 MED ORDER — EPOETIN ALFA-EPBX 40000 UNIT/ML IJ SOLN
40000.0000 [IU] | Freq: Once | INTRAMUSCULAR | Status: AC
  Administered 2021-10-01: 40000 [IU] via SUBCUTANEOUS
  Filled 2021-10-01: qty 1

## 2021-10-01 MED ORDER — FAM-TRASTUZUMAB DERUXTECAN-NXKI CHEMO 100 MG IV SOLR
5.3500 mg/kg | Freq: Once | INTRAVENOUS | Status: AC
  Administered 2021-10-01: 400 mg via INTRAVENOUS
  Filled 2021-10-01: qty 20

## 2021-10-01 MED ORDER — SODIUM CHLORIDE 0.9 % IV SOLN
10.0000 mg | Freq: Once | INTRAVENOUS | Status: AC
  Administered 2021-10-01: 10 mg via INTRAVENOUS
  Filled 2021-10-01: qty 10

## 2021-10-01 MED ORDER — SODIUM CHLORIDE 0.9% FLUSH
10.0000 mL | INTRAVENOUS | Status: DC | PRN
  Administered 2021-10-01: 10 mL

## 2021-10-01 MED ORDER — ACETAMINOPHEN 325 MG PO TABS
650.0000 mg | ORAL_TABLET | Freq: Once | ORAL | Status: AC
  Administered 2021-10-01: 650 mg via ORAL
  Filled 2021-10-01: qty 2

## 2021-10-01 MED ORDER — DEXTROSE 5 % IV SOLN: Freq: Once | INTRAVENOUS | Status: AC

## 2021-10-01 MED ORDER — PALONOSETRON HCL INJECTION 0.25 MG/5ML
0.2500 mg | Freq: Once | INTRAVENOUS | Status: AC
Start: 2021-10-01 — End: 2021-10-01
  Administered 2021-10-01: 0.25 mg via INTRAVENOUS
  Filled 2021-10-01: qty 5

## 2021-10-01 MED ORDER — FULVESTRANT 250 MG/5ML IM SOSY
250.0000 mg | PREFILLED_SYRINGE | Freq: Once | INTRAMUSCULAR | Status: AC
  Administered 2021-10-01: 250 mg via INTRAMUSCULAR
  Filled 2021-10-01: qty 5

## 2021-10-01 MED ORDER — DENOSUMAB 120 MG/1.7ML ~~LOC~~ SOLN
120.0000 mg | Freq: Once | SUBCUTANEOUS | Status: AC
  Administered 2021-10-01: 120 mg via SUBCUTANEOUS
  Filled 2021-10-01: qty 1.7

## 2021-10-01 MED ORDER — FULVESTRANT 250 MG/5ML IM SOSY
250.0000 mg | PREFILLED_SYRINGE | Freq: Once | INTRAMUSCULAR | Status: DC
  Filled 2021-10-01: qty 5

## 2021-10-01 MED ORDER — SODIUM CHLORIDE 0.9% IV SOLUTION
250.0000 mL | Freq: Once | INTRAVENOUS | Status: AC
Start: 2021-10-01 — End: 2021-10-01
  Administered 2021-10-01: 250 mL via INTRAVENOUS

## 2021-10-01 NOTE — Telephone Encounter (Signed)
Per MD review of labs today- including heme of 6.2 and creatinine of 2.4 - stated OK to proceed with treatment as ordered for today.

## 2021-10-01 NOTE — Progress Notes (Signed)
Per Dr. Jana Hakim - okay to run max blood rate at 300 ml/hr for both units.

## 2021-10-01 NOTE — Patient Instructions (Signed)
[facility]  Discharge Instructions: ?Thank you for choosing [facility] to provide your oncology and hematology care.  ? ?If you have a lab appointment with the Cancer Center, please go directly to the Cancer Center and check in at the registration area. ?  ?Wear comfortable clothing and clothing appropriate for easy access to any Portacath or PICC line.  ? ?We strive to give you quality time with your provider. You may need to reschedule your appointment if you arrive late (15 or more minutes).  Arriving late affects you and other patients whose appointments are after yours.  Also, if you miss three or more appointments without notifying the office, you may be dismissed from the clinic at the provider?s discretion.    ?  ?For prescription refill requests, have your pharmacy contact our office and allow 72 hours for refills to be completed.   ? ?Today you received the following chemotherapy and/or immunotherapy agents: Enhertu.    ?  ?To help prevent nausea and vomiting after your treatment, we encourage you to take your nausea medication as directed. ? ?BELOW ARE SYMPTOMS THAT SHOULD BE REPORTED IMMEDIATELY: ?*FEVER GREATER THAN 100.4 F (38 ?C) OR HIGHER ?*CHILLS OR SWEATING ?*NAUSEA AND VOMITING THAT IS NOT CONTROLLED WITH YOUR NAUSEA MEDICATION ?*UNUSUAL SHORTNESS OF BREATH ?*UNUSUAL BRUISING OR BLEEDING ?*URINARY PROBLEMS (pain or burning when urinating, or frequent urination) ?*BOWEL PROBLEMS (unusual diarrhea, constipation, pain near the anus) ?TENDERNESS IN MOUTH AND THROAT WITH OR WITHOUT PRESENCE OF ULCERS (sore throat, sores in mouth, or a toothache) ?UNUSUAL RASH, SWELLING OR PAIN  ?UNUSUAL VAGINAL DISCHARGE OR ITCHING  ? ?Items with * indicate a potential emergency and should be followed up as soon as possible or go to the Emergency Department if any problems should occur. ? ?Please show the CHEMOTHERAPY ALERT CARD or IMMUNOTHERAPY ALERT CARD at check-in to  the Emergency Department and triage nurse. ? ?Should you have questions after your visit or need to cancel or reschedule your appointment, please contact [facility]  Dept: [phone]  and follow the prompts.  Office hours are 8:00 a.m. to 4:30 p.m. Monday - Friday. Please note that voicemails left after 4:00 p.m. may not be returned until the following business day.  We are closed weekends and major holidays. You have access to a nurse at all times for urgent questions. Please call the main number to the clinic Dept: [phone] and follow the prompts. ? ? ?For any non-urgent questions, you may also contact your provider using MyChart. We now offer e-Visits for anyone 18 and older to request care online for non-urgent symptoms. For details visit [email or web address]. ?  ?Also download the MyChart app! Go to the app store, search "MyChart", open the app, select [facility], and log in with your MyChart username and password. ? ?Due to Covid, a mask is required upon entering the hospital/clinic. If you do not have a mask, one will be given to you upon arrival. For doctor visits, patients may have 1 support person aged 18 or older with them. For treatment visits, patients cannot have anyone with them due to current Covid guidelines and our immunocompromised population.  ? ?

## 2021-10-01 NOTE — Progress Notes (Signed)
Per Dr. Jana Hakim - okay to proceed with abnormal lab values today. Patient to receive blood transfusion.

## 2021-10-01 NOTE — Telephone Encounter (Signed)
Critical value hgb 6.2.  Message given to val dodd, rn at [phone].

## 2021-10-02 LAB — BPAM RBC
Blood Product Expiration Date: 202212022359
Blood Product Expiration Date: 202212022359
ISSUE DATE / TIME: 202210311424
ISSUE DATE / TIME: 202210311549
Unit Type and Rh: 5100
Unit Type and Rh: 5100

## 2021-10-02 LAB — TYPE AND SCREEN
ABO/RH(D): O POS
Antibody Screen: NEGATIVE
Donor AG Type: NEGATIVE
Donor AG Type: NEGATIVE
Unit division: 0
Unit division: 0

## 2021-10-10 ENCOUNTER — Encounter: Payer: Self-pay | Admitting: Oncology

## 2021-10-13 ENCOUNTER — Other Ambulatory Visit: Payer: Self-pay | Admitting: Oncology

## 2021-10-15 ENCOUNTER — Encounter: Payer: Self-pay | Admitting: Oncology

## 2021-10-17 ENCOUNTER — Other Ambulatory Visit: Payer: Self-pay

## 2021-10-17 ENCOUNTER — Encounter: Payer: Self-pay | Admitting: Certified Registered Nurse Anesthetist

## 2021-10-17 ENCOUNTER — Ambulatory Visit (HOSPITAL_COMMUNITY)
Admission: RE | Admit: 2021-10-17 | Discharge: 2021-10-17 | Disposition: A | Discharge status: Home / Self Care | Payer: BC Managed Care – PPO | Source: Ambulatory Visit | Attending: Oncology | Admitting: Oncology

## 2021-10-17 DIAGNOSIS — Z7189 Other specified counseling: Secondary | ICD-10-CM | POA: Insufficient documentation

## 2021-10-17 DIAGNOSIS — I517 Cardiomegaly: Secondary | ICD-10-CM | POA: Diagnosis not present

## 2021-10-17 DIAGNOSIS — C7951 Secondary malignant neoplasm of bone: Secondary | ICD-10-CM | POA: Diagnosis not present

## 2021-10-17 DIAGNOSIS — Z170 Estrogen receptor positive status [ER+]: Secondary | ICD-10-CM | POA: Diagnosis not present

## 2021-10-17 DIAGNOSIS — I890 Lymphedema, not elsewhere classified: Secondary | ICD-10-CM | POA: Insufficient documentation

## 2021-10-17 DIAGNOSIS — C799 Secondary malignant neoplasm of unspecified site: Secondary | ICD-10-CM | POA: Diagnosis not present

## 2021-10-17 DIAGNOSIS — C50919 Malignant neoplasm of unspecified site of unspecified female breast: Secondary | ICD-10-CM | POA: Diagnosis not present

## 2021-10-17 DIAGNOSIS — R590 Localized enlarged lymph nodes: Secondary | ICD-10-CM | POA: Diagnosis not present

## 2021-10-17 DIAGNOSIS — C50411 Malignant neoplasm of upper-outer quadrant of right female breast: Secondary | ICD-10-CM | POA: Insufficient documentation

## 2021-10-17 DIAGNOSIS — Z6841 Body Mass Index (BMI) 40.0 and over, adult: Secondary | ICD-10-CM | POA: Diagnosis not present

## 2021-10-17 LAB — GLUCOSE, CAPILLARY: Glucose-Capillary: 109 mg/dL — ABNORMAL HIGH (ref 70–99)

## 2021-10-17 MED ORDER — FLUDEOXYGLUCOSE F - 18 (FDG) INJECTION: 8.6000 | Freq: Once | INTRAVENOUS | Status: DC

## 2021-10-18 ENCOUNTER — Encounter: Payer: Self-pay | Admitting: Internal Medicine

## 2021-10-18 ENCOUNTER — Ambulatory Visit (AMBULATORY_SURGERY_CENTER): Payer: BC Managed Care – PPO | Admitting: Internal Medicine

## 2021-10-18 VITALS — BP 125/70 | HR 80 | Temp 97.7°F | Resp 16 | Ht 59.0 in | Wt 166.0 lb

## 2021-10-18 DIAGNOSIS — D126 Benign neoplasm of colon, unspecified: Secondary | ICD-10-CM | POA: Diagnosis not present

## 2021-10-18 DIAGNOSIS — K2970 Gastritis, unspecified, without bleeding: Secondary | ICD-10-CM

## 2021-10-18 DIAGNOSIS — K269 Duodenal ulcer, unspecified as acute or chronic, without hemorrhage or perforation: Secondary | ICD-10-CM

## 2021-10-18 DIAGNOSIS — R1013 Epigastric pain: Secondary | ICD-10-CM | POA: Diagnosis not present

## 2021-10-18 DIAGNOSIS — R948 Abnormal results of function studies of other organs and systems: Secondary | ICD-10-CM

## 2021-10-18 MED ORDER — SODIUM CHLORIDE 0.9 % IV SOLN: 500.0000 mL | Freq: Once | INTRAVENOUS | Status: DC

## 2021-10-18 NOTE — Patient Instructions (Signed)
Await pathology  Continue your normal medications- please increase Pantoprazole 40 mg to twice a day- take before your first and before your last meal of the day)  Avoid NSAIDS- Advil, Aleve, Ibuprofen, Motrin  Please read over handout about gastritis   YOU HAD AN ENDOSCOPIC PROCEDURE TODAY AT [facility]:   Refer to the procedure report that was given to you for any specific questions about what was found during the examination.  If the procedure report does not answer your questions, please call your gastroenterologist to clarify.  If you requested that your care partner not be given the details of your procedure findings, then the procedure report has been included in a sealed envelope for you to review at your convenience later.  YOU SHOULD EXPECT: Some feelings of bloating in the abdomen. Passage of more gas than usual.  Walking can help get rid of the air that was put into your GI tract during the procedure and reduce the bloating.   Please Note:  You might notice some irritation and congestion in your nose or some drainage.  This is from the oxygen used during your procedure.  There is no need for concern and it should clear up in a day or so.  SYMPTOMS TO REPORT IMMEDIATELY:  Following upper endoscopy (EGD)  Vomiting of blood or coffee ground material  New chest pain or pain under the shoulder blades  Painful or persistently difficult swallowing  New shortness of breath  Fever of 100F or higher  Black, tarry-looking stools  For urgent or emergent issues, a gastroenterologist can be reached at any hour by calling [phone]. Do not use MyChart messaging for urgent concerns.    DIET:  We do recommend a small meal at first, but then you may proceed to your regular diet.  Drink plenty of fluids but you should avoid alcoholic beverages for 24 hours.  ACTIVITY:  You should plan to take it easy for the rest of today and you should NOT DRIVE or use heavy machinery  until tomorrow (because of the sedation medicines used during the test).    FOLLOW UP: Our staff will call the number listed on your records 48-72 hours following your procedure to check on you and address any questions or concerns that you may have regarding the information given to you following your procedure. If we do not reach you, we will leave a message.  We will attempt to reach you two times.  During this call, we will ask if you have developed any symptoms of COVID 19. If you develop any symptoms (ie: fever, flu-like symptoms, shortness of breath, cough etc.) before then, please call [phone].  If you test positive for Covid 19 in the 2 weeks post procedure, please call and report this information to [place].    If any biopsies were taken you will be contacted by phone or by letter within the next 1-3 weeks.  Please call us at [phone] if you have not heard about the biopsies in 3 weeks.    SIGNATURES/CONFIDENTIALITY: You and/or your care partner have signed paperwork which will be entered into your electronic medical record.  These signatures attest to the fact that that the information above on your After Visit Summary has been reviewed and is understood.  Full responsibility of the confidentiality of this discharge information lies with you and/or your care-partner.

## 2021-10-18 NOTE — Progress Notes (Signed)
VS-DT 

## 2021-10-18 NOTE — Progress Notes (Signed)
Report given to PACU, vss 

## 2021-10-18 NOTE — Op Note (Signed)
[facility] Patient Name: [name] [name] Procedure Date: 10/18/2021 9:37 AM MRN: [id number] Endoscopist: Jerene Bears , MD Age: 50 Referring MD:  Date of Birth: 1971-09-25 Gender: Female Account #: [id number] Procedure:                Upper GI endoscopy Indications:              Epigastric abdominal pain, Abnormal PET scan of the                            GI tract with hypermetabolism at pyloric channel                            (distal stomach), Abdominal bloating --                            pantoprazole started in clinic about 3.5 weeks ago                            and today patient reports abdominal pain has                            resolved in the meantime Medicines:                Monitored Anesthesia Care Procedure:                Pre-Anesthesia Assessment:                           - Prior to the procedure, a History and Physical                            was performed, and patient medications and                            allergies were reviewed. The patient's tolerance of                            previous anesthesia was also reviewed. The risks                            and benefits of the procedure and the sedation                            options and risks were discussed with the patient.                            All questions were answered, and informed consent                            was obtained. Prior Anticoagulants: The patient has                            taken no previous anticoagulant or antiplatelet  agents. ASA Grade Assessment: III - A patient with                            severe systemic disease. After reviewing the risks                            and benefits, the patient was deemed in                            satisfactory condition to undergo the procedure.                           After obtaining informed consent, the endoscope was                            passed under direct vision. Throughout  the                            procedure, the patient's blood pressure, pulse, and                            oxygen saturations were monitored continuously. The                            GIF [id number] #[id number] was introduced through the                            mouth, and advanced to the second part of duodenum.                            The upper GI endoscopy was accomplished without                            difficulty. The patient tolerated the procedure                            well. Scope In: Scope Out: Findings:                 Normal mucosa was found in the entire esophagus.                           A 2 cm hiatal hernia was present.                           Diffuse moderate inflammation characterized by                            congestion (edema) and erythema was found in the                            entire examined stomach. Biopsies were taken with a                            cold  forceps for histology and Helicobacter pylori                            testing.                           Two non-bleeding cratered duodenal ulcers were                            found in the duodenal bulb and the duodenal sweep                            (largest ulceration is in the sweep (D1-D2                            junction)). The largest lesion was 20 mm in largest                            dimension with heaped edges. Biopsies were taken                            with a cold forceps for histology from the ulcer                            surround. Complications:            No immediate complications. Estimated Blood Loss:     Estimated blood loss was minimal. Impression:               - Normal mucosa was found in the entire esophagus.                           - 2 cm hiatal hernia.                           - Gastritis. Biopsied.                           - Non-bleeding duodenal ulcers with heaped edges.                            Biopsied. Recommendation:           -  Patient has a contact number available for                            emergencies. The signs and symptoms of potential                            delayed complications were discussed with the                            patient. Return to normal activities tomorrow.                            Written discharge instructions were provided to the  patient.                           - Resume previous diet.                           - Continue present medications. Increase to                            pantoprazole 40 mg twice daily (before the 1st and                            the last meal of the day). Avoid NSAIDs.                           - Await pathology results. Jerene Bears, MD 10/18/2021 10:11:55 AM This report has been signed electronically.

## 2021-10-18 NOTE — Progress Notes (Signed)
0949 Robinul 0.1 mg IV given due large amount of secretions upon assessment.  MD made aware, vss  

## 2021-10-18 NOTE — Progress Notes (Signed)
50 year old female with breast cancer metastatic who was seen in the office on 09/20/2021. Here today for EGD No significant change in medical history since that time See that note for details  Patient remains appropriate for upper endoscopy evaluate upper abdominal pain also abnormal hypermetabolism by PET scan in the gastric antrum/pylorus

## 2021-10-19 MED FILL — Dexamethasone Sodium Phosphate Inj 100 MG/10ML: INTRAMUSCULAR | Qty: 1 | Status: AC

## 2021-10-21 NOTE — Progress Notes (Signed)
[place]  Telephone:(336) [phone] Fax:(336) [phone]    ID: [name] [name] DOB: 09/23/1971  MR#: [id number]  CBJ#:[id number]  Patient Care Team: [name] Stalker, PA-C as PCP - General (Family Medicine) [name] Pain, MD as PCP - Cardiology (Cardiology) [name] [name], [name] Dad, MD as Consulting Physician (Oncology) [name] Ave, MD as Referring Physician (Internal Medicine) [name] Rudd, MD as Consulting Physician (Radiation Oncology) [name] Kussmaul, MD as Consulting Physician (General Surgery) [name] Dresser, MD as Consulting Physician (Cardiology) [name] Kaufmann, RN as Oncology Nurse Navigator [name] Germany, RN as Oncology Nurse Navigator [name] [name], RPH-CPP (Pharmacist) [name] Iba, MD (Nephrology) OTHER MD:   CHIEF COMPLAINT: Triple positive breast cancer (s/p right mastectomy)  CURRENT TREATMENT: Enhertu; fulvestrant; denosumab/Xgeva; retacrit   INTERVAL HISTORY: [name] returns today for follow up and treatment of her triple positive breast cancer.  She is accompanied by her husband Since her last visit, she underwent restaging PET scan on 10/17/2021 showing:interval decrease in size and hypermetabolism of mediastinal and right hilar metastatic lymphadenopathy; a new focus of hypermetabolism at or just deep to skin of anterior left shoulder where a tiny soft tissue nodule is evident, finding is indeterminate; new focus of hypermetabolism identified in region of porta hepatis with suggestion of lymphadenopathy; decrease in previously-described hypermetabolic lesion posterior right acetabular region.  She also underwent endoscopy on 10/18/2021 with Dr. Hilarie Fredrickson showing: normal mucosa in entire esophagus, 2 cm hiatal hernia, gastritis, and non-bleeding duodenal ulcers with heaped edges. Pathology from the procedure has not yet been processed.  She was started on Enhertu and fulvestrant, both given every 3 weeks, on 07/23/2021.  (She also received  denosumab/Xgeva and retacrit the same day.)  She will receive her fifth dose of Enhertu today.  Her most recent echo was 07/19/2021 and repeat is scheduled for 10/24/2021.  She is receiving fulvestrant every 3 weeks, with the dose adjusted to account for the altered dosing interval.  She was switched back to [place] on 07/23/2021, now given every 6 weeks.  Her most recent treatment was 10/01/2021  She has met with nephrology (Dr.Singh).  She was started on Retacrit here for her anemia of renal failure 06/11/2021.  Her dose was increased to 40,000 units 07/02/2021.  She receives this depending on her hemoglobin, scheduled every 3 weeks to coincide with her anti-HER2 treatment.  Her most recent transfusion was on 08/23/2021.   REVIEW OF SYSTEMS: [name] feels very cold today.  She generally feels tired doubtless because of the anemia.  She does have a good reticulocyte count, over 100, but may be losing some blood from the duodenal ulcers although those were not bleeding during endoscopy.  She is not aware of any melena or hematochezia.  Her Protonix has been increased to twice daily.  At home some days she does better, does housework and cooking, other days she feels more tired and just rests.  Her husband tells me that he or their 9 year old son [name] [name] as needed.  A detailed review of systems today was otherwise stable.  COVID 19 VACCINATION STATUS: [place] x2, most recently 03/2020; infection 04/2021   HISTORY OF CURRENT ILLNESS: From the original intake note:  [name] [name] palpated a lump on 01/15/2018. She felt soreness and shooting [name] under her arm and in the right breast. She underwent bilateral diagnostic mammography with tomography and right breast ultrasonography at [facility] on 03/12/2018 showing: breast density category C. The is architectural distortion at the 11 o'clock position. There is also an oval  mass in the right breast anterior depth. Examination of the right axilla showed enlarged  lymph nodes. Ultrasonography showed a 4.6 cm mass in the right breast upper outer quadrant posterior depth. An additional 1.2 cm oval mass in the right breast 12 o'clock middle depth. The lymph nodes in the right axillary are highly suggestive of malignancy.   Accordingly on 03/16/2018 she proceeded to biopsy of the right breast area in question. The pathology from this procedure showed ([id number]): At both the 11 and 12 o'clock positions: Invasive ductal carcinoma grade II. Ductal carcinoma in situ, high grade, with necrosis and calcifications. Prognostic indicators significant for: estrogen receptor, 90% positive and progesterone receptor, 40% positive, both with strong staining intensity. Proliferation marker Ki67 at 80%. HER2 amplified with ratios HER2/CEP17 SIGNALS 6.90 and average HER2 copies per cell 14.50  The patient's subsequent history is as detailed below.   PAST MEDICAL HISTORY: Past Medical History:  Diagnosis Date   Anemia    Anxiety    Blood transfusion without reported diagnosis    Breast cancer, left breast ([place]) 2000   Underwent lumpectomy, chemotherapy and radiation   Breast cancer, right breast ([place]) 2019   full mastectomy   Facial paralysis/Bells palsy    left side   Family history of lung cancer    Family history of lymphoma    Hypertension    Neuropathy    IN FINGERS AND TOES   RECENT INFUSION OF CHEMO   Personal history of breast cancer 05/06/2018   Renal disorder    just after TIA acute kidney injury   TIA (transient ischemic attack) 03/16/2017   [facility]  The patient has a previous history of left breast cancer diagnosed in 2000. She was seen by [facility] in [place], [place] under Dr. Loreta [name]. According to the patient, her left breast cancer was stage II with no lymph node involvement (likely T2N0). She had chemotherapy every 3 weeks for about 6 months. She did not takes any "red drugs." She also did not take anti-estrogens (likely  ER/PR negative).   TIA in 2018. She saw a neurologist as a precaution. She denies any clotting issues.    PAST SURGICAL HISTORY: Past Surgical History:  Procedure Laterality Date   BREAST LUMPECTOMY WITH AXILLARY LYMPH NODE BIOPSY Left 2000   biopsy   BREAST SURGERY     partial mastectomy with lymph node removal   ENDOBRONCHIAL ULTRASOUND Bilateral 09/05/2020   Procedure: ENDOBRONCHIAL ULTRASOUND;  Surgeon: Collene Gobble, MD;  Location: [facility];  Service: Cardiopulmonary;  Laterality: Bilateral;   MASTECTOMY Right 2019   & partial mastectomy left breast 2000   MASTECTOMY MODIFIED RADICAL Right 08/26/2018   Procedure: MASTECTOMY MODIFIED RADICAL;  Surgeon: [name] Kussmaul, MD;  Location: [facility];  Service: General;  Laterality: Right;   MODIFIED MASTECTOMY Right 08/26/2018   PORTACATH PLACEMENT Left 04/08/2018   Procedure: INSERTION PORT-A-CATH;  Surgeon: [name] Kussmaul, MD;  Location: [facility];  Service: General;  Laterality: Left;   TUBAL LIGATION Bilateral 2004   UPPER GASTROINTESTINAL ENDOSCOPY     VIDEO BRONCHOSCOPY N/A 09/05/2020   Procedure: VIDEO BRONCHOSCOPY WITHOUT FLUORO;  Surgeon: Collene Gobble, MD;  Location: [facility];  Service: Cardiopulmonary;  Laterality: N/A;   WISDOM TOOTH EXTRACTION      FAMILY HISTORY Family History  Problem Relation Age of Onset   Hypertension Mother    Hypertension Father    Diverticulosis Father    Diabetes Father    Lung cancer  Father 71       hx smoking   Pneumonia Maternal Aunt        died at a young adult   Lymphoma Paternal Aunt 12   Hypertension Maternal Grandmother    CAD Maternal Grandmother    Alzheimer's disease Maternal Grandmother        died at 65   Hypertension Paternal Grandmother    CAD Paternal Grandmother    Stroke Paternal Grandfather    Sickle cell trait Paternal Grandfather    Colon cancer Neg Hx    Esophageal cancer Neg Hx    Rectal cancer Neg Hx    Stomach cancer Neg Hx   The patient's father is  alive at age 78 and has a history of lung cancer. The patient's mother is alive at age 34. The patient has 1 brother and no sisters. She denies a family history of breast or ovarian cancer.     GYNECOLOGIC HISTORY:  Patient's last menstrual period was 12/21/2017. Menarche: 50 years old Age at first live birth: 50 years old She is GXP3.  Her LMP was  January 2019.  The patient used oral contraceptive from [phone] and the Depo-provera shot from [id number] with no complications. She never used HRT.  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  She also worked at [employer] as a Occupational psychologist.  She is now disabled.  Her husband, [name], is a Administrator. The patient's son, Shea Stakes age 6, works at [employer] in [place]. The patient's daughters, Lilia Pro age 78, and Levonne Spiller age 17, are students. Lilia Pro plans to attend [employer] in the fall. The patient's adopted son, Vonna Kotyk age 8, is also a Ship broker.  He had to go back to foster care in 2022 because of behavioral disturbances.  ADVANCED DIRECTIVES: In the absence of any documents to the contrary the patient's husband is her healthcare power of attorney   HEALTH MAINTENANCE: Social History   Tobacco Use   Smoking status: Never   Smokeless tobacco: Never  Vaping Use   Vaping Use: Never used  Substance Use Topics   Alcohol use: No   Drug use: No     Colonoscopy: n/a  PAP: 2015  Bone density: never done   Allergies  Allergen Reactions   Ace Inhibitors Swelling    Swelling of lips and face   Lisinopril Swelling    Swelling of lips, face   Amlodipine Swelling    Leg swelling   Shellfish Allergy Swelling   Adhesive [Tape] Itching and Rash    Please use paper tape   Latex Itching and Rash    Current Outpatient Medications  Medication Sig Dispense Refill   albuterol (VENTOLIN HFA) 108 (90 Base) MCG/ACT inhaler Inhale 2  puffs into the lungs every 6 (six) hours as needed for wheezing or shortness of breath. 8 g 2   anastrozole (ARIMIDEX) 1 MG tablet TAKE 1 TABLET BY MOUTH EVERY DAY (Patient not taking: Reported on 10/18/2021) 90 tablet 4   Ascorbic Acid (VITAMIN C) 1000 MG tablet Take 1,000 mg by mouth daily.     carvedilol (COREG) 3.125 MG tablet Take 3.125 mg by mouth 2 (two) times daily with a meal.     Cyanocobalamin (VITAMIN B12) 500 MCG TABS Take 500 mcg by mouth daily.      dexamethasone (DECADRON) 4 MG tablet Take 2 tablets (8 mg total) by  mouth daily. Start the day after chemotherapy for 2 days. (Patient not taking: Reported on 10/18/2021) 30 tablet 1   Elderberry 500 MG CAPS See admin instructions.     ferrous sulfate 325 (65 FE) MG tablet Take 1 tablet (325 mg total) by mouth daily. 30 tablet 0   gabapentin (NEURONTIN) 300 MG capsule Take one tablet in the morning, one tablet early afternoon, and two tablets at bedtime 120 capsule 1   guaiFENesin (ROBITUSSIN) 100 MG/5ML liquid Take 200 mg by mouth 3 (three) times daily as needed for cough. (Patient not taking: Reported on 10/18/2021)     lidocaine-prilocaine (EMLA) cream Apply to affected area once 30 g 3   losartan (COZAAR) 50 MG tablet Take 50 mg by mouth daily as needed. Per pt takes if SBP is >100     Multiple Vitamin (MULITIVITAMIN WITH MINERALS) TABS Take 1 tablet by mouth daily.     pantoprazole (PROTONIX) 40 MG tablet Take 1 tablet (40 mg total) by mouth daily. 90 tablet 1   prochlorperazine (COMPAZINE) 10 MG tablet Take 1 tablet (10 mg total) by mouth every 6 (six) hours as needed (Nausea or vomiting). 30 tablet 1   spironolactone-hydrochlorothiazide (ALDACTAZIDE) 25-25 MG tablet Take 1 tablet by mouth daily.     traMADol (ULTRAM) 50 MG tablet Take 1-2 tablets (50-100 mg total) by mouth every 6 (six) hours as needed. 60 tablet 0   valACYclovir (VALTREX) 1000 MG tablet Take 1 tablet (1,000 mg total) by mouth 3 (three) times daily. 21 tablet 0    venlafaxine XR (EFFEXOR-XR) 37.5 MG 24 hr capsule TAKE 1 CAPSULE BY MOUTH DAILY WITH BREAKFAST. 90 capsule 1   No current facility-administered medications for this visit.   Facility-Administered Medications Ordered in Other Visits  Medication Dose Route Frequency Provider Last Rate Last Admin   fludeoxyglucose F - 18 (FDG) injection 8.6 millicurie  8.6 millicurie Intravenous Once Sherryl Barters, MD        OBJECTIVE: African-American woman who appears stated age  Vitals:   10/22/21 0840  BP: (!) 113/46  Pulse: 81  Resp: 18  Temp: 98.2 F (36.8 C)  SpO2: 97%    Wt Readings from Last 3 Encounters:  10/22/21 167 lb 11.2 oz (76.1 kg)  10/18/21 166 lb (75.3 kg)  10/01/21 173 lb 8 oz (78.7 kg)   Body mass index is 33.87 kg/m.    ECOG FS:2 - Symptomatic, <50% confined to bed  Sclerae unicteric, EOMs intact Wearing a mask No cervical or supraclavicular adenopathy Lungs no rales or rhonchi Heart regular rate and rhythm Abd soft, nontender, positive bowel sounds MSK no focal spinal tenderness, left compression sleeve not in place Neuro: nonfocal, well oriented, appropriate affect Breasts: The right breast has undergone mastectomy with no evidence of chest wall recurrence.  The left breast and both axillae are benign.   LAB RESULTS:  CMP     Component Value Date/Time   NA 139 10/01/2021 0909   K 3.9 10/01/2021 0909   CL 113 (H) 10/01/2021 0909   CO2 19 (L) 10/01/2021 0909   GLUCOSE 170 (H) 10/01/2021 0909   BUN 38 (H) 10/01/2021 0909   CREATININE 2.42 (H) 10/01/2021 0909   CREATININE 2.77 (H) 05/30/2021 0917   CALCIUM 10.0 10/01/2021 0909   CALCIUM 11.2 (H) 02/24/2020 0830   PROT 5.6 (L) 10/01/2021 0909   ALBUMIN 2.5 (L) 10/01/2021 0909   AST 31 10/01/2021 0909   AST 96 (H) 05/30/2021 0917   ALT 18  10/01/2021 0909   ALT 56 (H) 05/30/2021 0917   ALKPHOS 112 10/01/2021 0909   BILITOT 0.7 10/01/2021 0909   BILITOT 1.8 (H) 05/30/2021 0917   GFRNONAA 24 (L)  10/01/2021 0909   GFRNONAA 20 (L) 05/30/2021 0917   GFRAA 41 (L) 08/30/2020 0808   GFRAA 45 (L) 07/31/2020 1400    Lab Results  Component Value Date   TOTALPROTELP 7.0 02/25/2019     Lab Results  Component Value Date   KPAFRELGTCHN 76.7 (H) 02/25/2019   LAMBDASER 27.2 (H) 02/25/2019   KAPLAMBRATIO 2.82 (H) 02/25/2019    Lab Results  Component Value Date   WBC 5.3 10/22/2021   NEUTROABS 3.4 10/22/2021   HGB 7.9 (L) 10/22/2021   HCT 22.8 (L) 10/22/2021   MCV 94.6 10/22/2021   PLT 118 (L) 10/22/2021   No results found for: LABCA2  No components found for: TTSVXB939  No results for input(s): INR in the last 168 hours.   No results found for: LABCA2  Lab Results  Component Value Date   CAN199 90 (H) 06/07/2021    No results found for: QZE092  No results found for: ZRA076  Lab Results  Component Value Date   CA2729 63.5 (H) 06/07/2021    No components found for: HGQUANT  No results found for: CEA1 / No results found for: CEA1   No results found for: AFPTUMOR  No results found for: CHROMOGRNA  No results found for: HGBA, HGBA2QUANT, HGBFQUANT, HGBSQUAN (Hemoglobinopathy evaluation)   No results found for: LDH  No results found for: IRON, TIBC, IRONPCTSAT (Iron and TIBC)  Lab Results  Component Value Date   FERRITIN 104 08/20/2021    Urinalysis    Component Value Date/Time   COLORURINE YELLOW 05/30/2021 1153   APPEARANCEUR CLEAR 05/30/2021 1153   LABSPEC 1.010 05/30/2021 1153   PHURINE 5.0 05/30/2021 1153   GLUCOSEU NEGATIVE 05/30/2021 1153   HGBUR SMALL (A) 05/30/2021 1153   BILIRUBINUR NEGATIVE 05/30/2021 [place] 05/30/2021 1153   PROTEINUR 30 (A) 05/30/2021 1153   UROBILINOGEN 0.2 11/29/2011 1656   NITRITE NEGATIVE 05/30/2021 1153   LEUKOCYTESUR NEGATIVE 05/30/2021 1153    STUDIES: MR 3D Recon At Scanner  Result Date: 10/01/2021 CLINICAL DATA:  Epigastric [name]. Mild biliary ductal dilatation on recent  ultrasound. Metastatic breast carcinoma. EXAM: MRI ABDOMEN WITHOUT AND WITH CONTRAST (INCLUDING MRCP) TECHNIQUE: Multiplanar multisequence MR imaging of the abdomen was performed both before and after the administration of intravenous contrast. Heavily T2-weighted images of the biliary and pancreatic ducts were obtained, and three-dimensional MRCP images were rendered by post processing. CONTRAST:  8.58m GADAVIST GADOBUTROL 1 MMOL/ML IV SOLN COMPARISON:  Ultrasound on 09/13/2021 FINDINGS: Lower chest: No acute findings. Hepatobiliary: No hepatic masses identified. Gallbladder is unremarkable. No evidence of biliary ductal dilatation. No evidence of choledocholithiasis. Pancreas: No solid pancreatic mass identified. No evidence of pancreatic ductal dilatation. A 5 mm simple cystic focus is seen in the pancreatic body on image 48/26. Spleen:  Within normal limits in size and appearance. Adrenals/Urinary Tract: No masses identified. No evidence of hydronephrosis. Stomach/Bowel: Visualized portion unremarkable. Vascular/Lymphatic: No pathologically enlarged lymph nodes identified. No acute vascular findings. Other:  None. Musculoskeletal:  No suspicious bone lesions identified. IMPRESSION: 5 mm simple cystic lesion in the pancreatic body, suspicious for indolent cystic pancreatic neoplasm such as a side-branch IPMN. Recommend continued follow-up by MRI in 1 year. No evidence of biliary ductal dilatation, choledocholithiasis, or other acute findings. Electronically Signed   By:  Marlaine Hind M.D.   On: 10/01/2021 14:13   NM PET Image Restag (PS) Skull Base To Thigh  Result Date: 10/18/2021 CLINICAL DATA:  Subsequent treatment strategy for breast cancer. EXAM: NUCLEAR MEDICINE PET SKULL BASE TO THIGH TECHNIQUE: 8.5 mCi F-18 FDG was injected intravenously. Full-ring PET imaging was performed from the skull base to thigh after the radiotracer. CT data was obtained and used for attenuation correction and anatomic  localization. Fasting blood glucose: 109 mg/dl COMPARISON:  PET-CT 07/17/2021 FINDINGS: Mediastinal blood pool activity: SUV max 2.8 Liver activity: SUV max NA NECK: No hypermetabolic lymph nodes in the neck. Incidental CT findings: none CHEST: Interval decrease in hypermetabolic thoracic lymphadenopathy. Index prevascular node measured previously at 16 mm short axis is 6 mm short axis today with SUV max = 3.3, decreased from 8.3 previously. Subcarinal lymph node identified previously at 12 mm short axis is 7 mm short axis today with SUV max = 4.5, decreased from 14.6 previously. Inferior right hilar node identified previously with SUV max = 15.1 on the previous study now demonstrates SUV max = 3.6. Tiny focus of hypermetabolic activity is identified along the skin of the anterior left shoulder. There is a subtle 5 mm nodular superficial subcutaneous/cutaneous lesion at this location (image 32/4) with SUV max = 5.3. Incidental CT findings: Heart is enlarged. No substantial pericardial effusion. Left Port-A-Cath tip is positioned at the SVC/RA junction. Right mastectomy with surgical clips noted in the right axilla. Dependent atelectasis noted in the lung bases with 13 mm ground-glass nodule posterior left lower lobe (57/4) potentially atelectasis. ABDOMEN/PELVIS: No abnormal hypermetabolic activity within the liver, pancreas, adrenal glands, or spleen. Focal hypermetabolism ( SUV max = 6.5) is identified in the region of the porta hepatis/gallbladder neck. This area is not well demonstrated on noncontrast CT imaging today, but there may be a 9 mm short axis lymph node on image 97 of series 4. Incidental CT findings: Pneumobilia suggests prior sphincterotomy. SKELETON: Intensely hypermetabolic posterior right acetabular lesion described previously has decreased in the interval with SUV max = 3.1 today compared to 16.9 previously. No new focal hypermetabolic activity to suggest skeletal metastasis. Incidental CT  findings: Sclerotic lesions in the T9 and T10 vertebral body show no hypermetabolism, stable. IMPRESSION: 1. Interval decrease in size and hypermetabolism of mediastinal and right hilar metastatic lymphadenopathy. 2. There is a new focus of hypermetabolism at or just deep to the skin of the anterior left shoulder where a tiny soft tissue nodule is evident. Finding is indeterminate and this would be somewhat unusual for metastatic disease although close follow-up warranted. This region may be amenable to clinical inspection. 3. New focus of hypermetabolism identified in the region of the porta hepatis with a suggestion of lymphadenopathy on today's noncontrast imaging. Dedicated CT abdomen with intravenous contrast material recommended for more definitive characterization. 4. Previously described hypermetabolic lesion posterior right acetabular region has decreased in the interval. Electronically Signed   By: Misty Stanley M.D.   On: 10/18/2021 12:26   DG Abd 2 Views  Result Date: 09/28/2021 CLINICAL DATA:  Abdominal [name] EXAM: ABDOMEN - 2 VIEW COMPARISON:  Abdominal x-ray 07/31/2020 FINDINGS: Bowel-gas pattern appears within normal limits. No abnormally distended gas-filled loops of bowel to suggest obstruction. No evidence of free air. Moderate amount of retained fecal material throughout the colon. Surgical clips in the pelvis. No suspicious calcifications identified. IMPRESSION: No acute process identified. Moderate retained fecal material in the colon. Electronically Signed   By: Ofilia Neas  M.D.   On: 09/28/2021 16:53   MR ABDOMEN MRCP W WO CONTAST  Result Date: 10/01/2021 CLINICAL DATA:  Epigastric [name]. Mild biliary ductal dilatation on recent ultrasound. Metastatic breast carcinoma. EXAM: MRI ABDOMEN WITHOUT AND WITH CONTRAST (INCLUDING MRCP) TECHNIQUE: Multiplanar multisequence MR imaging of the abdomen was performed both before and after the administration of intravenous contrast. Heavily  T2-weighted images of the biliary and pancreatic ducts were obtained, and three-dimensional MRCP images were rendered by post processing. CONTRAST:  8.62m GADAVIST GADOBUTROL 1 MMOL/ML IV SOLN COMPARISON:  Ultrasound on 09/13/2021 FINDINGS: Lower chest: No acute findings. Hepatobiliary: No hepatic masses identified. Gallbladder is unremarkable. No evidence of biliary ductal dilatation. No evidence of choledocholithiasis. Pancreas: No solid pancreatic mass identified. No evidence of pancreatic ductal dilatation. A 5 mm simple cystic focus is seen in the pancreatic body on image 48/26. Spleen:  Within normal limits in size and appearance. Adrenals/Urinary Tract: No masses identified. No evidence of hydronephrosis. Stomach/Bowel: Visualized portion unremarkable. Vascular/Lymphatic: No pathologically enlarged lymph nodes identified. No acute vascular findings. Other:  None. Musculoskeletal:  No suspicious bone lesions identified. IMPRESSION: 5 mm simple cystic lesion in the pancreatic body, suspicious for indolent cystic pancreatic neoplasm such as a side-branch IPMN. Recommend continued follow-up by MRI in 1 year. No evidence of biliary ductal dilatation, choledocholithiasis, or other acute findings. Electronically Signed   By: JMarlaine HindM.D.   On: 10/01/2021 14:13     ELIGIBLE FOR AVAILABLE RESEARCH PROTOCOL: no   ASSESSMENT: 50y.o. [place]  (1) status post left lumpectomy in 2000 for a (?) T2N0 breast cancer,  (a) status post adjuvant chemotherapy  (b) status post adjuvant radiation  (c) did not receive antiestrogens  (2) genetics testing May 18, 2018 through the common Hereditary Cancers Panel + Myelodysplastic Syndrome/Leukemia Panel found no deleterious mutations in APC, ATM, AXIN2, BARD1, BLM, BMPR1A, BRCA1, BRCA2, BRIP1, CDH1, CDK4, CDKN2A (p14ARF), CDKN2A (p16INK4a), CEBPA, CHEK2, CTNNA1, DICER1, EPCAM*, GATA2, GREM1*, HRAS, KIT, MEN1, MLH1, MSH2, MSH3, MSH6, MUTYH, NBN, NF1, PALB2,  PDGFRA, PMS2, POLD1, POLE, PTEN, RAD50, RAD51C, RAD51D, RUNX1, SDHB, SDHC, SDHD, SMAD4, SMARCA4, STK11, TERC, TERT, TP53, TSC1, TSC2, VHL The following genes were evaluated for sequence changes only: HOXB13*, NTHL1*, SDHA  (a)  3 variants of uncertain significance were identified in the genes BARD1 c.764A>G (p.Asn255Ser), BRIP1 c.2563C>T (p.Arg855Cys), and PALB2 c.3103A>T (p.Ile1035Phe).   (3) status post right breast biopsy 03/16/2018 for a clinical mT2-3 N2, anatomic stage III invasive ductal carcinoma, grade 2, triple positive, with an MIB-1 of 80%.  (a) bone scan and chest CT scan negative for metastases except for a T6 lytic lesion of concern  (b) thoracic spine MRI was ordered May 2019 but never performed.  (4) neoadjuvant chemotherapy consisting of carboplatin, docetaxel, trastuzumab and Pertuzumab every 3 weeks x 6 starting 04/16/2018  (a) pertuzumab held beginning with cycle 2 because of diarrhea  (5) trastuzumab was to be continued Q4w indefinitely, discontinued after 10/21/2019 dose with progression  (a) echocardiogram on 06/29/2018 showed an ejection fraction in the 65-70% range  (b) echocardiogram 11/19/2018 showed an ejection fraction in the 60-65% range  (c) echo 04/07/2019 shows an ejection fraction in the 55-60% range  (d) echo 07/15/2019 shows EF of 60-65%--for additional echoes see under #12 below  (6) right mastectomy and sentinel lymph node sampling on 08/26/2018 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 This was delivered using a 3-D conformal, 4 field technique. The patient then received a boost to the mastectomy scar. This delivered an  additional 10 Gy in 5 fractions using an en face electron field. The total dose was 60.4   (8) anastrozole started 12/03/2018, discontinued August 2022 with progression  (a) [place] on 10/01/2018 was 64.3, and estradiol 7.0, consistent with menopause  (b) referral to pelvic floor rehabilitation 12/03/2018  (c) [place] on 03/16/2020 was 80.8 with estradiol less than 2.5; repeat 11/29/2021 was 70.1   METASTATIC DISEASE: March 2020 (9) CT of neck and CT angiography of the chest 02/04/2019 finds the T6 lytic lesion noted May 2019 (see #3 above) is now sclerotic; a sclerotic T3 lesion is stable; there is a new lytic lesion in the manubrium  (a) PET scan 03/12/2019 shows manubrium, T3 and T5 metastases, no visceral disease  (b) biopsy of manubrial lesion planned but not done secondary to pandemic  (c) Bone scan 09/01/19 shows ? progression of disease in spine; MRI 09/02/19 shows early metastases in T10 (new), T3 and T5 stable; CT chest 09/01/19 shows 63m pulmonary nodules that are new, but nonspecific and warrant future follow up  (d) PET 10/26/2019 shows no lung or liver lesions; new mediastinal adenopathy, bone lesions-- T-DM1 started  (e) PET scan 03/08/2020 shows resolution of the mediastinal adenopathy and hypermetabolic bone lesions, questionable subcutaneous nodule along the left lateral shoulder  (f) for additional staging studies see under "12" below  (10) SRS to spine, palliative treatment to sternum, from 05/13/2019 through 05/26/2019:  1. Chest_sternum // 40 Gy in 10 fractions  2. Thoracic Spine (T3) // 18 Gy in 1 fraction   3. Thoracic Spine (T5) // 18 Gy in 1 fraction   (11) denosumab/Xgeva started 09/08/2019, given every 6 weeks  (12) T-DM1 started 01/15/20201, discontinued after 07/02/2021 dose with disease progression  (a) echo 10/26/2019 shows stable EF at 55-60%  (b) echo 03/08/2020 shows an ejection fraction in the 70-75% range  (c) echo 07/12/2020 shows an ejection fraction in the 65 to  70% range.  (d) PET scan 03/08/2020 shows no active disease; a 0.9 cm subcutaneous nodule in the left shoulder area seen on the PET was not identifiable by exam 03/16/2020  (e) PET 08/24/2020 shows stable disease except for a new 0.6 cm lymph node with an SUV of 10.1  (f) bronchoscopy 09/05/2020 with endobronchial ultrasound found no detectable mass in the region noted on the September PET scan  (g) T-DM1 discontinued and trastuzumab resumed 11/29/2020  (h) PET scan 02/26/2021 shows mild nodular increase: TDM-1 resumed 03/07/2021, discontinued after 05/09/2021 dose pending restaging studies  (i) noncontrast chest CT 06/01/2021 shows possibly mildly enlarged adenopathy, and an apparently new sclerotic lesion at T8.  There was also evidence of pancreatitis and possible pneumonitis  (j) PET scan 07/17/2021 shows progression in lymph nodes and bone, but no evidence of involvement of lung or liver and no evidence of residual pancreatitis.  There is continued unexplained gastric pyloric hypermetabolic activity (see 132Ibelow).  (13) Enhertu started 07/23/2021, repeated every 21 days  (14) fulvestrant Q 21 days (alternating 500/250 mg doses) started 07/23/2021  (15) anemia of renal failure:  (A) retacrit started 12/27/2020 at 10K unit every 21 days, dose gradually increased to current 40K units  Q 21 days  (B) EGD 10/19/2019 it shows duodenal ulcers which also may be contributing  (16) restaging studies:  (A) PET scan 10/17/2021 shows significant response to Enhertu  (B) echo 10/24/2021  PLAN: [name] is tolerating Enhertu well.  The plan is to continue to maximum response and then reassess.  The PET scan just obtained is very favorable.  She has a repeat echo 2 days from now and of course we will continue to repeat that every 3 months while on this medication  We have not made much progress on her anemia.  It is likely that the renal insufficiency is compounded by some ulcer bleeding.  Her Protonix  has been doubled to twice daily.  I am going to change her Retacrit to 20,000 units weekly and see if we can make some headway in that regard  I reviewed her entire situation with her husband so he has a good understanding of the fact that we have an incurable disease for which we have good treatment.  I encouraged Merry Lofty to wear her compression sleeve routinely.  The goal is not to shrink the mild lymphedema but to keep it from progressing.  Total encounter time 35 minutes.Sarajane Jews C. Errin Chewning, MD 10/22/21 9:00 AM Medical Oncology and Hematology [facility] [place], [place] [address] Tel. [phone]    Fax. [phone]   I, Wilburn Mylar, am acting as scribe for Dr. Virgie [name]. Tacori Kvamme.  I, Lurline Del MD, have reviewed the above documentation for accuracy and completeness, and I agree with the above.   *Total Encounter Time as defined by the Centers for Medicare and Medicaid Services includes, in addition to the face-to-face time of a patient visit (documented in the note above) non-face-to-face time: obtaining and reviewing outside history, ordering and reviewing medications, tests or procedures, care coordination (communications with other health care professionals or caregivers) and documentation in the medical record.

## 2021-10-22 ENCOUNTER — Inpatient Hospital Stay: Payer: BC Managed Care – PPO | Attending: Oncology | Admitting: Oncology

## 2021-10-22 ENCOUNTER — Other Ambulatory Visit: Payer: Self-pay | Admitting: *Deleted

## 2021-10-22 ENCOUNTER — Inpatient Hospital Stay: Payer: BC Managed Care – PPO

## 2021-10-22 ENCOUNTER — Telehealth: Payer: Self-pay | Admitting: *Deleted

## 2021-10-22 ENCOUNTER — Other Ambulatory Visit: Payer: Self-pay

## 2021-10-22 ENCOUNTER — Telehealth: Payer: Self-pay

## 2021-10-22 VITALS — BP 100/59 | HR 66 | Temp 98.0°F | Resp 16

## 2021-10-22 VITALS — BP 121/63

## 2021-10-22 VITALS — BP 113/46 | HR 81 | Temp 98.2°F | Resp 18 | Ht 59.0 in | Wt 167.7 lb

## 2021-10-22 DIAGNOSIS — C50811 Malignant neoplasm of overlapping sites of right female breast: Secondary | ICD-10-CM | POA: Diagnosis not present

## 2021-10-22 DIAGNOSIS — Z170 Estrogen receptor positive status [ER+]: Secondary | ICD-10-CM

## 2021-10-22 DIAGNOSIS — R110 Nausea: Secondary | ICD-10-CM

## 2021-10-22 DIAGNOSIS — N184 Chronic kidney disease, stage 4 (severe): Secondary | ICD-10-CM

## 2021-10-22 DIAGNOSIS — K858 Other acute pancreatitis without necrosis or infection: Secondary | ICD-10-CM

## 2021-10-22 DIAGNOSIS — C50411 Malignant neoplasm of upper-outer quadrant of right female breast: Secondary | ICD-10-CM

## 2021-10-22 DIAGNOSIS — C7951 Secondary malignant neoplasm of bone: Secondary | ICD-10-CM | POA: Diagnosis not present

## 2021-10-22 DIAGNOSIS — C50911 Malignant neoplasm of unspecified site of right female breast: Secondary | ICD-10-CM | POA: Diagnosis not present

## 2021-10-22 DIAGNOSIS — Z95828 Presence of other vascular implants and grafts: Secondary | ICD-10-CM

## 2021-10-22 DIAGNOSIS — D631 Anemia in chronic kidney disease: Secondary | ICD-10-CM

## 2021-10-22 DIAGNOSIS — Z5111 Encounter for antineoplastic chemotherapy: Secondary | ICD-10-CM | POA: Insufficient documentation

## 2021-10-22 DIAGNOSIS — Z5112 Encounter for antineoplastic immunotherapy: Secondary | ICD-10-CM | POA: Insufficient documentation

## 2021-10-22 DIAGNOSIS — D649 Anemia, unspecified: Secondary | ICD-10-CM

## 2021-10-22 DIAGNOSIS — Z79811 Long term (current) use of aromatase inhibitors: Secondary | ICD-10-CM | POA: Diagnosis not present

## 2021-10-22 DIAGNOSIS — Z79899 Other long term (current) drug therapy: Secondary | ICD-10-CM | POA: Diagnosis not present

## 2021-10-22 LAB — CBC WITH DIFFERENTIAL/PLATELET
Abs Immature Granulocytes: 0.06 10*3/uL (ref 0.00–0.07)
Basophils Absolute: 0.1 10*3/uL (ref 0.0–0.1)
Basophils Relative: 1 %
Eosinophils Absolute: 0.3 10*3/uL (ref 0.0–0.5)
Eosinophils Relative: 5 %
HCT: 22.8 % — ABNORMAL LOW (ref 36.0–46.0)
Hemoglobin: 7.9 g/dL — ABNORMAL LOW (ref 12.0–15.0)
Immature Granulocytes: 1 %
Lymphocytes Relative: 10 %
Lymphs Abs: 0.5 10*3/uL — ABNORMAL LOW (ref 0.7–4.0)
MCH: 32.8 pg (ref 26.0–34.0)
MCHC: 34.6 g/dL (ref 30.0–36.0)
MCV: 94.6 fL (ref 80.0–100.0)
Monocytes Absolute: 1 10*3/uL (ref 0.1–1.0)
Monocytes Relative: 18 %
Neutro Abs: 3.4 10*3/uL (ref 1.7–7.7)
Neutrophils Relative %: 65 %
Platelets: 118 10*3/uL — ABNORMAL LOW (ref 150–400)
RBC: 2.41 MIL/uL — ABNORMAL LOW (ref 3.87–5.11)
RDW: 19.6 % — ABNORMAL HIGH (ref 11.5–15.5)
WBC: 5.3 10*3/uL (ref 4.0–10.5)
nRBC: 0 % (ref 0.0–0.2)

## 2021-10-22 LAB — CMP (CANCER CENTER ONLY)
ALT: 22 U/L (ref 0–44)
AST: 37 U/L (ref 15–41)
Albumin: 2.8 g/dL — ABNORMAL LOW (ref 3.5–5.0)
Alkaline Phosphatase: 121 U/L (ref 38–126)
Anion gap: 6 (ref 5–15)
BUN: 36 mg/dL — ABNORMAL HIGH (ref 6–20)
CO2: 19 mmol/L — ABNORMAL LOW (ref 22–32)
Calcium: 9.7 mg/dL (ref 8.9–10.3)
Chloride: 111 mmol/L (ref 98–111)
Creatinine: 2.23 mg/dL — ABNORMAL HIGH (ref 0.44–1.00)
GFR, Estimated: 26 mL/min — ABNORMAL LOW (ref >60)
Glucose, Bld: 155 mg/dL — ABNORMAL HIGH (ref 70–99)
Potassium: 3.4 mmol/L — ABNORMAL LOW (ref 3.5–5.1)
Sodium: 136 mmol/L (ref 135–145)
Total Bilirubin: 1 mg/dL (ref 0.3–1.2)
Total Protein: 6 g/dL — ABNORMAL LOW (ref 6.5–8.1)

## 2021-10-22 LAB — RETICULOCYTES
Immature Retic Fract: 34.1 % — ABNORMAL HIGH (ref 2.3–15.9)
RBC.: 2.43 MIL/uL — ABNORMAL LOW (ref 3.87–5.11)
Retic Count, Absolute: 106.2 10*3/uL (ref 19.0–186.0)
Retic Ct Pct: 4.4 % — ABNORMAL HIGH (ref 0.4–3.1)

## 2021-10-22 LAB — PREPARE RBC (CROSSMATCH)

## 2021-10-22 MED ORDER — DEXTROSE 5 % IV SOLN: Freq: Once | INTRAVENOUS | Status: AC

## 2021-10-22 MED ORDER — SODIUM CHLORIDE 0.9% FLUSH
10.0000 mL | INTRAVENOUS | Status: DC | PRN
  Administered 2021-10-22: 10 mL

## 2021-10-22 MED ORDER — PROCHLORPERAZINE MALEATE 10 MG PO TABS
10.0000 mg | ORAL_TABLET | Freq: Once | ORAL | Status: AC
  Administered 2021-10-22: 10 mg via ORAL
  Filled 2021-10-22: qty 1

## 2021-10-22 MED ORDER — FULVESTRANT 250 MG/5ML IM SOSY
500.0000 mg | PREFILLED_SYRINGE | Freq: Once | INTRAMUSCULAR | Status: AC
  Administered 2021-10-22: 500 mg via INTRAMUSCULAR
  Filled 2021-10-22: qty 10

## 2021-10-22 MED ORDER — ACETAMINOPHEN 325 MG PO TABS: 650.0000 mg | ORAL_TABLET | Freq: Once | ORAL | Status: DC

## 2021-10-22 MED ORDER — SODIUM CHLORIDE 0.9 % IV SOLN: 8.0000 mg | Freq: Once | INTRAVENOUS | Status: DC

## 2021-10-22 MED ORDER — PALONOSETRON HCL INJECTION 0.25 MG/5ML
0.2500 mg | Freq: Once | INTRAVENOUS | Status: AC
  Administered 2021-10-22: 0.25 mg via INTRAVENOUS
  Filled 2021-10-22: qty 5

## 2021-10-22 MED ORDER — PANTOPRAZOLE SODIUM 40 MG PO TBEC: 40.0000 mg | DELAYED_RELEASE_TABLET | Freq: Two times a day (BID) | ORAL | 4 refills | Status: AC

## 2021-10-22 MED ORDER — FAM-TRASTUZUMAB DERUXTECAN-NXKI CHEMO 100 MG IV SOLR
5.3500 mg/kg | Freq: Once | INTRAVENOUS | Status: AC
  Administered 2021-10-22: 400 mg via INTRAVENOUS
  Filled 2021-10-22: qty 20

## 2021-10-22 MED ORDER — HEPARIN SOD (PORK) LOCK FLUSH 100 UNIT/ML IV SOLN: 500.0000 [IU] | Freq: Once | INTRAVENOUS | Status: DC | PRN

## 2021-10-22 MED ORDER — DIPHENHYDRAMINE HCL 25 MG PO CAPS
25.0000 mg | ORAL_CAPSULE | Freq: Once | ORAL | Status: AC
  Administered 2021-10-22: 25 mg via ORAL
  Filled 2021-10-22: qty 1

## 2021-10-22 MED ORDER — SODIUM CHLORIDE 0.9 % IV SOLN
10.0000 mg | Freq: Once | INTRAVENOUS | Status: AC
  Administered 2021-10-22: 10 mg via INTRAVENOUS
  Filled 2021-10-22: qty 10

## 2021-10-22 MED ORDER — EPOETIN ALFA-EPBX 40000 UNIT/ML IJ SOLN
40000.0000 [IU] | Freq: Once | INTRAMUSCULAR | Status: AC
  Administered 2021-10-22: 40000 [IU] via SUBCUTANEOUS
  Filled 2021-10-22: qty 1

## 2021-10-22 MED ORDER — SODIUM CHLORIDE 0.9% IV SOLUTION
250.0000 mL | Freq: Once | INTRAVENOUS | Status: AC
  Administered 2021-10-22: 250 mL via INTRAVENOUS

## 2021-10-22 MED ORDER — ACETAMINOPHEN 325 MG PO TABS
650.0000 mg | ORAL_TABLET | Freq: Once | ORAL | Status: AC
  Administered 2021-10-22: 650 mg via ORAL
  Filled 2021-10-22: qty 2

## 2021-10-22 MED ORDER — SODIUM CHLORIDE 0.9% FLUSH
10.0000 mL | INTRAVENOUS | Status: AC | PRN
  Administered 2021-10-22: 10 mL

## 2021-10-22 MED ORDER — ONDANSETRON HCL 4 MG/2ML IJ SOLN: 8.0000 mg | Freq: Once | INTRAMUSCULAR | Status: DC

## 2021-10-22 MED ORDER — GABAPENTIN 300 MG PO CAPS: 300.0000 mg | ORAL_CAPSULE | Freq: Every day | ORAL | 1 refills | Status: AC

## 2021-10-22 NOTE — Telephone Encounter (Signed)
No answer, left message to call back if having any issues or concerns, B.Carzell Saldivar RN. 

## 2021-10-22 NOTE — Progress Notes (Signed)
Pt tolerated blood well. Pt ambulated out of facility. Advised to call office if reaction occurs or go to closest ED. Pt verbalized understanding.

## 2021-10-22 NOTE — Telephone Encounter (Signed)
Per MD ok to treat per elevated creatine of 2.3- informed treatment room nurse.

## 2021-10-22 NOTE — Progress Notes (Signed)
Ok to proceed with fam-trastuzumab despite Hgb=7.9 per Dr. Jana Hakim.  Pt will be transfused following her chemo today.

## 2021-10-22 NOTE — Patient Instructions (Addendum)
[facility] MEDICAL [facility]COL[facility]  Discharge Instructions: ?Thank you for choosing [facility] to provide your oncology and hematology care.  ? ?If you have a lab appointment with the Cancer Center, please go directly to the Cancer Center and check in at the registration area. ?  ?Wear comfortable clothing and clothing appropriate for easy access to any Portacath or PICC line.  ? ?We strive to give you quality time with your provider. You may need to reschedule your appointment if you arrive late (15 or more minutes).  Arriving late affects you and other patients whose appointments are after yours.  Also, if you miss three or more appointments without notifying the office, you may be dismissed from the clinic at the provider?s discretion.    ?  ?For prescription refill requests, have your pharmacy contact our office and allow 72 hours for refills to be completed.   ? ?Today you received the following chemotherapy and/or immunotherapy agents: Enhertu.    ?  ?To help prevent nausea and vomiting after your treatment, we encourage you to take your nausea medication as directed. ? ?BELOW ARE SYMPTOMS THAT SHOULD BE REPORTED IMMEDIATELY: ?*FEVER GREATER THAN 100.4 F (38 ?C) OR HIGHER ?*CHILLS OR SWEATING ?*NAUSEA AND VOMITING THAT IS NOT CONTROLLED WITH YOUR NAUSEA MEDICATION ?*UNUSUAL SHORTNESS OF BREATH ?*UNUSUAL BRUISING OR BLEEDING ?*URINARY PROBLEMS (pain or burning when urinating, or frequent urination) ?*BOWEL PROBLEMS (unusual diarrhea, constipation, pain near the anus) ?TENDERNESS IN MOUTH AND THROAT WITH OR WITHOUT PRESENCE OF ULCERS (sore throat, sores in mouth, or a toothache) ?UNUSUAL RASH, SWELLING OR PAIN  ?UNUSUAL VAGINAL DISCHARGE OR ITCHING  ? ?Items with * indicate a potential emergency and should be followed up as soon as possible or go to the Emergency Department if any problems should occur. ? ?Please show the CHEMOTHERAPY ALERT CARD or IMMUNOTHERAPY ALERT CARD at check-in to  the Emergency Department and triage nurse. ? ?Should you have questions after your visit or need to cancel or reschedule your appointment, please contact [facility]  Dept: [phone]  and follow the prompts.  Office hours are 8:00 a.m. to 4:30 p.m. Monday - Friday. Please note that voicemails left after 4:00 p.m. may not be returned until the following business day.  We are closed weekends and major holidays. You have access to a nurse at all times for urgent questions. Please call the main number to the clinic Dept: [phone] and follow the prompts. ? ? ?For any non-urgent questions, you may also contact your provider using MyChart. We now offer e-Visits for anyone 18 and older to request care online for non-urgent symptoms. For details visit [email or web address]. ?  ?Also download the MyChart app! Go to the app store, search "MyChart", open the app, select , and log in with your MyChart username and password. ? ?Due to Covid, a mask is required upon entering the hospital/clinic. If you do not have a mask, one will be given to you upon arrival. For doctor visits, patients may have 1 support person aged 18 or older with them. For treatment visits, patients cannot have anyone with them due to current Covid guidelines and our immunocompromised population.  ? ?

## 2021-10-22 NOTE — Telephone Encounter (Signed)
Left message on follow up call. 

## 2021-10-23 ENCOUNTER — Other Ambulatory Visit: Payer: Self-pay | Admitting: Oncology

## 2021-10-23 ENCOUNTER — Telehealth: Payer: Self-pay | Admitting: Oncology

## 2021-10-23 DIAGNOSIS — N184 Chronic kidney disease, stage 4 (severe): Secondary | ICD-10-CM

## 2021-10-23 DIAGNOSIS — E1122 Type 2 diabetes mellitus with diabetic chronic kidney disease: Secondary | ICD-10-CM

## 2021-10-23 LAB — BPAM RBC
Blood Product Expiration Date: 202212202359
ISSUE DATE / TIME: 202211211341
Unit Type and Rh: 5100

## 2021-10-23 LAB — TYPE AND SCREEN
ABO/RH(D): O POS
Antibody Screen: NEGATIVE
Donor AG Type: NEGATIVE
Unit division: 0

## 2021-10-23 NOTE — Telephone Encounter (Signed)
Scheduled per sch msg. Called and left msg  

## 2021-10-23 NOTE — Progress Notes (Signed)
Updated as CKD stage 4  Thanks  GM

## 2021-10-23 NOTE — Progress Notes (Signed)
We are changing the Retacrit and can get to 20,000 units every week.

## 2021-10-24 ENCOUNTER — Ambulatory Visit (HOSPITAL_COMMUNITY)
Admission: RE | Admit: 2021-10-24 | Discharge: 2021-10-24 | Disposition: A | Discharge status: Home / Self Care | Payer: BC Managed Care – PPO | Source: Ambulatory Visit | Attending: Oncology | Admitting: Oncology

## 2021-10-24 ENCOUNTER — Ambulatory Visit (HOSPITAL_COMMUNITY): Admission: RE | Admit: 2021-10-24 | Payer: BC Managed Care – PPO | Source: Ambulatory Visit

## 2021-10-24 DIAGNOSIS — I10 Essential (primary) hypertension: Secondary | ICD-10-CM | POA: Diagnosis not present

## 2021-10-24 DIAGNOSIS — Z7189 Other specified counseling: Secondary | ICD-10-CM | POA: Diagnosis not present

## 2021-10-24 DIAGNOSIS — C7951 Secondary malignant neoplasm of bone: Secondary | ICD-10-CM | POA: Diagnosis not present

## 2021-10-24 DIAGNOSIS — Z6841 Body Mass Index (BMI) 40.0 and over, adult: Secondary | ICD-10-CM | POA: Insufficient documentation

## 2021-10-24 DIAGNOSIS — I371 Nonrheumatic pulmonary valve insufficiency: Secondary | ICD-10-CM | POA: Diagnosis not present

## 2021-10-24 DIAGNOSIS — Z0189 Encounter for other specified special examinations: Secondary | ICD-10-CM | POA: Diagnosis not present

## 2021-10-24 DIAGNOSIS — C50411 Malignant neoplasm of upper-outer quadrant of right female breast: Secondary | ICD-10-CM | POA: Insufficient documentation

## 2021-10-24 DIAGNOSIS — Z170 Estrogen receptor positive status [ER+]: Secondary | ICD-10-CM | POA: Diagnosis not present

## 2021-10-24 DIAGNOSIS — I890 Lymphedema, not elsewhere classified: Secondary | ICD-10-CM | POA: Insufficient documentation

## 2021-10-24 LAB — ECHOCARDIOGRAM COMPLETE
Area-P 1/2: 3.12 cm2
S' Lateral: 1.78 cm

## 2021-10-29 ENCOUNTER — Ambulatory Visit: Payer: BC Managed Care – PPO

## 2021-10-29 ENCOUNTER — Other Ambulatory Visit: Payer: Self-pay | Admitting: Oncology

## 2021-10-29 NOTE — Progress Notes (Signed)
The following is a copy of Dr. Vena Rua very helpful note:  Pyrtle, Lajuan Lines, MD  Algernon Huxley, RN Cc: Gurkirat Basher, Virgie Dad, MD Please let patient know that her biopsies from the stomach and ulcer in the small intestine are benign --no evidence of metastatic disease in the stomach or small intestine.  Symptoms were improving when I saw her for endoscopy which is great news, after having started PPI  She should continue pantoprazole 40 mg twice daily for at least 12 weeks and then likely daily thereafter   Query if this could be secondary to chemotherapy?  We can consider repeat EGD in about 12 weeks to ensure healing   Please make patient aware of pathology results and place recall EGD for 3 months; or can be scheduled for mid to late February.   I have copied Dr. Jana Hakim as he will see her soon for oncology follow-up

## 2021-11-05 ENCOUNTER — Other Ambulatory Visit: Payer: Self-pay | Admitting: *Deleted

## 2021-11-05 ENCOUNTER — Inpatient Hospital Stay: Payer: BC Managed Care – PPO | Attending: Oncology

## 2021-11-05 ENCOUNTER — Other Ambulatory Visit: Payer: Self-pay

## 2021-11-05 ENCOUNTER — Inpatient Hospital Stay: Payer: BC Managed Care – PPO

## 2021-11-05 VITALS — BP 102/77 | HR 88 | Temp 100.9°F | Resp 16

## 2021-11-05 DIAGNOSIS — K858 Other acute pancreatitis without necrosis or infection: Secondary | ICD-10-CM

## 2021-11-05 DIAGNOSIS — C771 Secondary and unspecified malignant neoplasm of intrathoracic lymph nodes: Secondary | ICD-10-CM | POA: Diagnosis not present

## 2021-11-05 DIAGNOSIS — C50811 Malignant neoplasm of overlapping sites of right female breast: Secondary | ICD-10-CM | POA: Diagnosis not present

## 2021-11-05 DIAGNOSIS — N184 Chronic kidney disease, stage 4 (severe): Secondary | ICD-10-CM

## 2021-11-05 DIAGNOSIS — D631 Anemia in chronic kidney disease: Secondary | ICD-10-CM | POA: Insufficient documentation

## 2021-11-05 DIAGNOSIS — Z170 Estrogen receptor positive status [ER+]: Secondary | ICD-10-CM | POA: Diagnosis not present

## 2021-11-05 DIAGNOSIS — E1122 Type 2 diabetes mellitus with diabetic chronic kidney disease: Secondary | ICD-10-CM

## 2021-11-05 DIAGNOSIS — Z95828 Presence of other vascular implants and grafts: Secondary | ICD-10-CM

## 2021-11-05 DIAGNOSIS — C7951 Secondary malignant neoplasm of bone: Secondary | ICD-10-CM | POA: Insufficient documentation

## 2021-11-05 DIAGNOSIS — Z5112 Encounter for antineoplastic immunotherapy: Secondary | ICD-10-CM | POA: Insufficient documentation

## 2021-11-05 DIAGNOSIS — C50411 Malignant neoplasm of upper-outer quadrant of right female breast: Secondary | ICD-10-CM

## 2021-11-05 DIAGNOSIS — Z79899 Other long term (current) drug therapy: Secondary | ICD-10-CM | POA: Diagnosis not present

## 2021-11-05 LAB — CBC WITH DIFFERENTIAL (CANCER CENTER ONLY)
Abs Immature Granulocytes: 0.06 10*3/uL (ref 0.00–0.07)
Basophils Absolute: 0.1 10*3/uL (ref 0.0–0.1)
Basophils Relative: 1 %
Eosinophils Absolute: 0.1 10*3/uL (ref 0.0–0.5)
Eosinophils Relative: 2 %
HCT: 22 % — ABNORMAL LOW (ref 36.0–46.0)
Hemoglobin: 7.5 g/dL — ABNORMAL LOW (ref 12.0–15.0)
Immature Granulocytes: 1 %
Lymphocytes Relative: 6 %
Lymphs Abs: 0.4 10*3/uL — ABNORMAL LOW (ref 0.7–4.0)
MCH: 31.8 pg (ref 26.0–34.0)
MCHC: 34.1 g/dL (ref 30.0–36.0)
MCV: 93.2 fL (ref 80.0–100.0)
Monocytes Absolute: 0.8 10*3/uL (ref 0.1–1.0)
Monocytes Relative: 13 %
Neutro Abs: 4.6 10*3/uL (ref 1.7–7.7)
Neutrophils Relative %: 77 %
Platelet Count: 97 10*3/uL — ABNORMAL LOW (ref 150–400)
RBC: 2.36 MIL/uL — ABNORMAL LOW (ref 3.87–5.11)
RDW: 19.9 % — ABNORMAL HIGH (ref 11.5–15.5)
WBC Count: 6 10*3/uL (ref 4.0–10.5)
nRBC: 0 % (ref 0.0–0.2)

## 2021-11-05 LAB — SAMPLE TO BLOOD BANK

## 2021-11-05 MED ORDER — HEPARIN SOD (PORK) LOCK FLUSH 100 UNIT/ML IV SOLN: 500.0000 [IU] | Freq: Once | INTRAVENOUS | Status: DC

## 2021-11-05 MED ORDER — DENOSUMAB 120 MG/1.7ML ~~LOC~~ SOLN: 120.0000 mg | Freq: Once | SUBCUTANEOUS | Status: DC

## 2021-11-05 MED ORDER — SODIUM CHLORIDE 0.9% FLUSH: 10.0000 mL | Freq: Once | INTRAVENOUS | Status: DC

## 2021-11-05 MED ORDER — EPOETIN ALFA-EPBX 20000 UNIT/ML IJ SOLN
20000.0000 [IU] | Freq: Once | INTRAMUSCULAR | Status: AC
  Administered 2021-11-05: 20000 [IU] via SUBCUTANEOUS
  Filled 2021-11-05: qty 1

## 2021-11-05 MED ORDER — FULVESTRANT 250 MG/5ML IM SOLN: 250.0000 mg | Freq: Once | INTRAMUSCULAR | Status: DC

## 2021-11-09 MED FILL — Dexamethasone Sodium Phosphate Inj 100 MG/10ML: INTRAMUSCULAR | Qty: 1 | Status: AC

## 2021-11-11 NOTE — Progress Notes (Signed)
[name] [name]  [phone] [phone]    ID: [name] [name] DOB: 30-May-1971  MR#: [id number]  OAC#:[id number]  Patient Care Team: Marda Stalker, PA-C as PCP - General (Family Medicine) Jerline Pain, MD as PCP - Cardiology (Cardiology) Kainoah Bartosiewicz, Virgie Dad, MD as Consulting Physician (Oncology) Loreta Ave, MD as Referring Physician (Internal Medicine) Kyung Rudd, MD as Consulting Physician (Radiation Oncology) Jovita Kussmaul, MD as Consulting Physician (General Surgery) Larey Dresser, MD as Consulting Physician (Cardiology) Mauro Kaufmann, RN as Oncology Nurse Navigator Rockwell Germany, RN as Oncology Nurse Navigator Raina Mina, RPH-CPP (Pharmacist) Murlean Iba, MD (Nephrology) OTHER MD:   CHIEF COMPLAINT: Triple positive breast cancer (s/p right mastectomy)  CURRENT TREATMENT: Enhertu; fulvestrant; denosumab/Xgeva; retacrit   INTERVAL HISTORY: [name] returns today for follow up and treatment of her triple positive breast cancer.  She was started on Enhertu and fulvestrant, both given every 3 weeks, on 07/23/2021.  ( She will receive her sixth dose of Enhertu today.  She has no side effects from this that she is aware of and she tells me today that she has not been taking the Decadron on days 2 and 3 because she has not needed to.  She is receiving fulvestrant every 3 weeks, with the dose adjusted to account for the altered dosing interval.  Her most recent echo on 10/24/2021 showed an ejection fraction of 60-65%. This is stable.  She was switched back to [place] on 07/23/2021, now given every 6 weeks for convenience her most recent treatment was 10/01/2021 and she has a dose due today.Marland Kitchen  Her PET scan had shown uptake in the upper GI tract..  Dr. Hilarie Fredrickson reached out to me with the results from her endoscopy on 10/18/2021. Pathology from the stomach and small intestine biopsies was benign.  She was started on PPIs and she is taking this  twice daily.  She is symptomatically improved.  She has met with nephrology (Dr.Singh).  She was started on Retacrit here for her anemia of renal failure 06/11/2021.  She is now receiving this every 4 weeks.  So far we have not seen a significant response in the hemoglobin although the reticulocyte count is up. Her most recent dose was on 11/05/2021.   REVIEW OF SYSTEMS: [name] feels short of breath when she climbs stairs or does anything strenuous.  She also feels palpitations at the same time.  This is of course due to her anemia.  She is able to walk any distance on a flat surface and has been doing a lot of Christmas shopping she tells me.  She has not had chest pain or pressure issues.  There have been no falls or fainting symptoms.  A detailed review of systems was otherwise stable.   COVID 19 VACCINATION STATUS: [place] x2, most recently 03/2020; infection 04/2021   HISTORY OF CURRENT ILLNESS: From the original intake note:  [name] [name] palpated a lump on 01/15/2018. She felt soreness and shooting pain under her arm and in the right breast. She underwent bilateral diagnostic mammography with tomography and right breast ultrasonography at [facility] on 03/12/2018 showing: breast density category C. The is architectural distortion at the 11 o'clock position. There is also an oval mass in the right breast anterior depth. Examination of the right axilla showed enlarged lymph nodes. Ultrasonography showed a 4.6 cm mass in the right breast upper outer quadrant posterior depth. An additional 1.2 cm oval mass in the right breast 12 o'clock middle depth.  The lymph nodes in the right axillary are highly suggestive of malignancy.   Accordingly on 03/16/2018 she proceeded to biopsy of the right breast area in question. The pathology from this procedure showed ([id number]): At both the 11 and 12 o'clock positions: Invasive ductal carcinoma grade II. Ductal carcinoma in situ, high grade, with necrosis and  calcifications. Prognostic indicators significant for: estrogen receptor, 90% positive and progesterone receptor, 40% positive, both with strong staining intensity. Proliferation marker Ki67 at 80%. HER2 amplified with ratios HER2/CEP17 SIGNALS 6.90 and average HER2 copies per cell 14.50  The patient's subsequent history is as detailed below.   PAST MEDICAL HISTORY: Past Medical History:  Diagnosis Date   Anemia    Anxiety    Blood transfusion without reported diagnosis    Breast cancer, left breast ([place]) 2000   Underwent lumpectomy, chemotherapy and radiation   Breast cancer, right breast ([place]) 2019   full mastectomy   Facial paralysis/Bells palsy    left side   Family history of lung cancer    Family history of lymphoma    Hypertension    Neuropathy    IN FINGERS AND TOES   RECENT INFUSION OF CHEMO   Personal history of breast cancer 05/06/2018   Renal disorder    just after TIA acute kidney injury   TIA (transient ischemic attack) 03/16/2017   [facility]  The patient has a previous history of left breast cancer diagnosed in 2000. She was seen by [facility] in [place], [place] under Dr. Loreta Ave. According to the patient, her left breast cancer was stage II with no lymph node involvement (likely T2N0). She had chemotherapy every 3 weeks for about 6 months. She did not takes any "red drugs." She also did not take anti-estrogens (likely ER/PR negative).   TIA in 2018. She saw a neurologist as a precaution. She denies any clotting issues.    PAST SURGICAL HISTORY: Past Surgical History:  Procedure Laterality Date   BREAST LUMPECTOMY WITH AXILLARY LYMPH NODE BIOPSY Left 2000   biopsy   BREAST SURGERY     partial mastectomy with lymph node removal   ENDOBRONCHIAL ULTRASOUND Bilateral 09/05/2020   Procedure: ENDOBRONCHIAL ULTRASOUND;  Surgeon: Collene Gobble, MD;  Location: [facility];  Service: Cardiopulmonary;  Laterality: Bilateral;   MASTECTOMY  Right 2019   & partial mastectomy left breast 2000   MASTECTOMY MODIFIED RADICAL Right 08/26/2018   Procedure: MASTECTOMY MODIFIED RADICAL;  Surgeon: Jovita Kussmaul, MD;  Location: [place];  Service: General;  Laterality: Right;   MODIFIED MASTECTOMY Right 08/26/2018   PORTACATH PLACEMENT Left 04/08/2018   Procedure: INSERTION PORT-A-CATH;  Surgeon: Jovita Kussmaul, MD;  Location: [place];  Service: General;  Laterality: Left;   TUBAL LIGATION Bilateral 2004   UPPER GASTROINTESTINAL ENDOSCOPY     VIDEO BRONCHOSCOPY N/A 09/05/2020   Procedure: VIDEO BRONCHOSCOPY WITHOUT FLUORO;  Surgeon: Collene Gobble, MD;  Location: [facility];  Service: Cardiopulmonary;  Laterality: N/A;   WISDOM TOOTH EXTRACTION      FAMILY HISTORY Family History  Problem Relation Age of Onset   Hypertension Mother    Hypertension Father    Diverticulosis Father    Diabetes Father    Lung cancer Father 97       hx smoking   Pneumonia Maternal Aunt        died at a young adult   Lymphoma Paternal Aunt 76   Hypertension Maternal Grandmother    CAD Maternal Grandmother  Alzheimer's disease Maternal Grandmother        died at 61   Hypertension Paternal Grandmother    CAD Paternal Grandmother    Stroke Paternal Grandfather    Sickle cell trait Paternal Grandfather    Colon cancer Neg Hx    Esophageal cancer Neg Hx    Rectal cancer Neg Hx    Stomach cancer Neg Hx   The patient's father is alive at age 76 and has a history of lung cancer. The patient's mother is alive at age 74. The patient has 1 brother and no sisters. She denies a family history of breast or ovarian cancer.     GYNECOLOGIC HISTORY:  Patient's last menstrual period was 12/21/2017. Menarche: 50 years old Age at first live birth: 50 years old She is GXP3.  Her LMP was  January 2019.  The patient used oral contraceptive from [phone] and the Depo-provera shot from [phone] with no complications. She never used HRT.  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  She also worked at [employer] as a Occupational psychologist.  She is now disabled.  Her husband, [name], is a Administrator. The patient's son, Shea Stakes age 57, works at [employer] in [place]. The patient's daughters, Lilia Pro age 70, and Levonne Spiller age 12, are students. Lilia Pro plans to attend [employer] in the fall. The patient's adopted son, Vonna Kotyk age 75, is also a Ship broker.  He had to go back to foster care in 2022 because of behavioral disturbances.  ADVANCED DIRECTIVES: In the absence of any documents to the contrary the patient's husband is her healthcare power of attorney   HEALTH MAINTENANCE: Social History   Tobacco Use   Smoking status: Never   Smokeless tobacco: Never  Vaping Use   Vaping Use: Never used  Substance Use Topics   Alcohol use: No   Drug use: No     Colonoscopy: n/a  PAP: 2015  Bone density: never done   Allergies  Allergen Reactions   Ace Inhibitors Swelling    Swelling of lips and face   Lisinopril Swelling    Swelling of lips, face   Amlodipine Swelling    Leg swelling   Shellfish Allergy Swelling   Adhesive [Tape] Itching and Rash    Please use paper tape   Latex Itching and Rash    Current Outpatient Medications  Medication Sig Dispense Refill   albuterol (VENTOLIN HFA) 108 (90 Base) MCG/ACT inhaler Inhale 2 puffs into the lungs every 6 (six) hours as needed for wheezing or shortness of breath. 8 g 2   anastrozole (ARIMIDEX) 1 MG tablet TAKE 1 TABLET BY MOUTH EVERY DAY (Patient not taking: Reported on 10/18/2021) 90 tablet 4   Ascorbic Acid (VITAMIN C) 1000 MG tablet Take 1,000 mg by mouth daily.     carvedilol (COREG) 3.125 MG tablet Take 3.125 mg by mouth 2 (two) times daily with a meal.     Cyanocobalamin (VITAMIN B12) 500 MCG TABS Take 500 mcg by mouth daily.      dexamethasone (DECADRON) 4 MG tablet Take 2 tablets (8 mg  total) by mouth daily. Start the day after chemotherapy for 2 days. (Patient not taking: Reported on 10/18/2021) 30 tablet 1   Elderberry 500 MG CAPS See admin instructions.     ferrous sulfate 325 (65 FE) MG tablet Take 1 tablet (325 mg total) by mouth  daily. 30 tablet 0   gabapentin (NEURONTIN) 300 MG capsule Take 1 capsule (300 mg total) by mouth at bedtime. Take one tablet in the morning, one tablet early afternoon, and two tablets at bedtime 90 capsule 1   guaiFENesin (ROBITUSSIN) 100 MG/5ML liquid Take 200 mg by mouth 3 (three) times daily as needed for cough. (Patient not taking: Reported on 10/18/2021)     lidocaine-prilocaine (EMLA) cream Apply to affected area once 30 g 3   losartan (COZAAR) 50 MG tablet Take 50 mg by mouth daily as needed. Per pt takes if SBP is >100     Multiple Vitamin (MULITIVITAMIN WITH MINERALS) TABS Take 1 tablet by mouth daily.     pantoprazole (PROTONIX) 40 MG tablet Take 1 tablet (40 mg total) by mouth 2 (two) times daily. 90 tablet 4   prochlorperazine (COMPAZINE) 10 MG tablet Take 1 tablet (10 mg total) by mouth every 6 (six) hours as needed (Nausea or vomiting). 30 tablet 1   spironolactone-hydrochlorothiazide (ALDACTAZIDE) 25-25 MG tablet Take 1 tablet by mouth daily.     traMADol (ULTRAM) 50 MG tablet Take 1-2 tablets (50-100 mg total) by mouth every 6 (six) hours as needed. 60 tablet 0   valACYclovir (VALTREX) 1000 MG tablet Take 1 tablet (1,000 mg total) by mouth 3 (three) times daily. 21 tablet 0   venlafaxine XR (EFFEXOR-XR) 37.5 MG 24 hr capsule TAKE 1 CAPSULE BY MOUTH DAILY WITH BREAKFAST. 90 capsule 1   No current facility-administered medications for this visit.    OBJECTIVE: African-American woman examined in the infusion area  There were no vitals filed for this visit.  Wt Readings from Last 3 Encounters:  10/22/21 167 lb 11.2 oz (76.1 kg)  10/18/21 166 lb (75.3 kg)  10/01/21 173 lb 8 oz (78.7 kg)   There is no height or weight on file to  calculate BMI.    For vitals associated with the 11/12/2021 visit please see the infusion area flowsheet  ECOG FS:2 - Symptomatic, <50% confined to bed  Sclerae unicteric, EOMs intact Wearing a mask Lungs no rales or rhonchi Heart regular rate and rhythm Abd soft, nontender MSK no focal spinal tenderness, chronic left upper extremity lymphedema with sleeve in place Neuro: nonfocal, well oriented, positive affect Breasts: Deferred   LAB RESULTS:  CMP     Component Value Date/Time   NA 136 10/22/2021 0818   K 3.4 (L) 10/22/2021 0818   CL 111 10/22/2021 0818   CO2 19 (L) 10/22/2021 0818   GLUCOSE 155 (H) 10/22/2021 0818   BUN 36 (H) 10/22/2021 0818   CREATININE 2.23 (H) 10/22/2021 0818   CALCIUM 9.7 10/22/2021 0818   CALCIUM 11.2 (H) 02/24/2020 0830   PROT 6.0 (L) 10/22/2021 0818   ALBUMIN 2.8 (L) 10/22/2021 0818   AST 37 10/22/2021 0818   ALT 22 10/22/2021 0818   ALKPHOS 121 10/22/2021 0818   BILITOT 1.0 10/22/2021 0818   GFRNONAA 26 (L) 10/22/2021 0818   GFRAA 41 (L) 08/30/2020 0808   GFRAA 45 (L) 07/31/2020 1400    Lab Results  Component Value Date   TOTALPROTELP 7.0 02/25/2019     Lab Results  Component Value Date   KPAFRELGTCHN 76.7 (H) 02/25/2019   LAMBDASER 27.2 (H) 02/25/2019   KAPLAMBRATIO 2.82 (H) 02/25/2019    Lab Results  Component Value Date   WBC 6.0 11/05/2021   NEUTROABS 4.6 11/05/2021   HGB 7.5 (L) 11/05/2021   HCT 22.0 (L) 11/05/2021   MCV 93.2  11/05/2021   PLT 97 (L) 11/05/2021   No results found for: LABCA2  No components found for: [id number]  No results for input(s): INR in the last 168 hours.   No results found for: LABCA2  Lab Results  Component Value Date   CAN199 90 (H) 06/07/2021    No results found for: XTG626  No results found for: RSW546  Lab Results  Component Value Date   CA2729 63.5 (H) 06/07/2021    No components found for: HGQUANT  No results found for: CEA1 / No results found for: CEA1   No  results found for: AFPTUMOR  No results found for: CHROMOGRNA  No results found for: HGBA, HGBA2QUANT, HGBFQUANT, HGBSQUAN (Hemoglobinopathy evaluation)   No results found for: LDH  No results found for: IRON, TIBC, IRONPCTSAT (Iron and TIBC)  Lab Results  Component Value Date   FERRITIN 104 08/20/2021    Urinalysis    Component Value Date/Time   COLORURINE YELLOW 05/30/2021 1153   APPEARANCEUR CLEAR 05/30/2021 1153   LABSPEC 1.010 05/30/2021 1153   PHURINE 5.0 05/30/2021 1153   GLUCOSEU NEGATIVE 05/30/2021 1153   HGBUR SMALL (A) 05/30/2021 1153   BILIRUBINUR NEGATIVE 05/30/2021 [place] 05/30/2021 1153   PROTEINUR 30 (A) 05/30/2021 1153   UROBILINOGEN 0.2 11/29/2011 1656   NITRITE NEGATIVE 05/30/2021 1153   LEUKOCYTESUR NEGATIVE 05/30/2021 1153    STUDIES: NM PET Image Restag (PS) Skull Base To Thigh  Result Date: 10/18/2021 CLINICAL DATA:  Subsequent treatment strategy for breast cancer. EXAM: NUCLEAR MEDICINE PET SKULL BASE TO THIGH TECHNIQUE: 8.5 mCi F-18 FDG was injected intravenously. Full-ring PET imaging was performed from the skull base to thigh after the radiotracer. CT data was obtained and used for attenuation correction and anatomic localization. Fasting blood glucose: 109 mg/dl COMPARISON:  PET-CT 07/17/2021 FINDINGS: Mediastinal blood pool activity: SUV max 2.8 Liver activity: SUV max NA NECK: No hypermetabolic lymph nodes in the neck. Incidental CT findings: none CHEST: Interval decrease in hypermetabolic thoracic lymphadenopathy. Index prevascular node measured previously at 16 mm short axis is 6 mm short axis today with SUV max = 3.3, decreased from 8.3 previously. Subcarinal lymph node identified previously at 12 mm short axis is 7 mm short axis today with SUV max = 4.5, decreased from 14.6 previously. Inferior right hilar node identified previously with SUV max = 15.1 on the previous study now demonstrates SUV max = 3.6. Tiny focus of  hypermetabolic activity is identified along the skin of the anterior left shoulder. There is a subtle 5 mm nodular superficial subcutaneous/cutaneous lesion at this location (image 32/4) with SUV max = 5.3. Incidental CT findings: Heart is enlarged. No substantial pericardial effusion. Left Port-A-Cath tip is positioned at the SVC/RA junction. Right mastectomy with surgical clips noted in the right axilla. Dependent atelectasis noted in the lung bases with 13 mm ground-glass nodule posterior left lower lobe (57/4) potentially atelectasis. ABDOMEN/PELVIS: No abnormal hypermetabolic activity within the liver, pancreas, adrenal glands, or spleen. Focal hypermetabolism ( SUV max = 6.5) is identified in the region of the porta hepatis/gallbladder neck. This area is not well demonstrated on noncontrast CT imaging today, but there may be a 9 mm short axis lymph node on image 97 of series 4. Incidental CT findings: Pneumobilia suggests prior sphincterotomy. SKELETON: Intensely hypermetabolic posterior right acetabular lesion described previously has decreased in the interval with SUV max = 3.1 today compared to 16.9 previously. No new focal hypermetabolic activity to suggest skeletal metastasis. Incidental  CT findings: Sclerotic lesions in the T9 and T10 vertebral body show no hypermetabolism, stable. IMPRESSION: 1. Interval decrease in size and hypermetabolism of mediastinal and right hilar metastatic lymphadenopathy. 2. There is a new focus of hypermetabolism at or just deep to the skin of the anterior left shoulder where a tiny soft tissue nodule is evident. Finding is indeterminate and this would be somewhat unusual for metastatic disease although close follow-up warranted. This region may be amenable to clinical inspection. 3. New focus of hypermetabolism identified in the region of the porta hepatis with a suggestion of lymphadenopathy on today's noncontrast imaging. Dedicated CT abdomen with intravenous contrast  material recommended for more definitive characterization. 4. Previously described hypermetabolic lesion posterior right acetabular region has decreased in the interval. Electronically Signed   By: Misty Stanley M.D.   On: 10/18/2021 12:26   ECHOCARDIOGRAM COMPLETE  Result Date: 10/24/2021    ECHOCARDIOGRAM REPORT   Patient Name:   [name] [name] Date of Exam: 10/24/2021 Medical Rec #:  [id number]     Height:       59.0 in Accession #:    [id number]    Weight:       167.7 lb Date of Birth:  Oct 01, 1971     BSA:          53.711 m Patient Age:    36 years      BP:           100/59 mmHg Patient Gender: F             HR:           61 bpm. Exam Location:  Outpatient Procedure: 2D Echo and Strain Analysis Indications:    Chemo Z09  History:        Patient has prior history of Echocardiogram examinations, most                 recent 07/19/2021. Breast Cancer, Bone metastases and TIA; Risk                 Factors:Hypertension.  Sonographer:    Mikki Santee RDCS Referring Phys: [place]  1. Left ventricular ejection fraction, by estimation, is 60 to 65%. The left ventricle has normal function. The left ventricle has no regional wall motion abnormalities. Left ventricular diastolic parameters were normal. The average left ventricular global longitudinal strain is -22.7 %. The global longitudinal strain is normal.  2. Right ventricular systolic function is normal. The right ventricular size is normal.  3. The mitral valve is normal in structure. Trivial mitral valve regurgitation.  4. The aortic valve is tricuspid. Aortic valve regurgitation is not visualized. No aortic stenosis is present.  5. The inferior vena cava is normal in size with greater than 50% respiratory variability, suggesting right atrial pressure of 3 mmHg. Comparison(s): Compated to prior echo in 07/2021, there is no significant change. GLS remains normal. FINDINGS  Left Ventricle: Left ventricular ejection fraction, by  estimation, is 60 to 65%. The left ventricle has normal function. The left ventricle has no regional wall motion abnormalities. The average left ventricular global longitudinal strain is -22.7 %. The global longitudinal strain is normal. The left ventricular internal cavity size was normal in size. There is no left ventricular hypertrophy. Left ventricular diastolic parameters were normal. Right Ventricle: The right ventricular size is normal. Right vetricular wall thickness was not well visualized. Right ventricular systolic function is normal. Left Atrium: Left atrial size was normal in  size. Right Atrium: Right atrial size was normal in size. Pericardium: There is no evidence of pericardial effusion. Mitral Valve: The mitral valve is normal in structure. Trivial mitral valve regurgitation. Tricuspid Valve: The tricuspid valve is normal in structure. Tricuspid valve regurgitation is trivial. Aortic Valve: The aortic valve is tricuspid. Aortic valve regurgitation is not visualized. No aortic stenosis is present. Pulmonic Valve: The pulmonic valve was normal in structure. Pulmonic valve regurgitation is mild. Aorta: The aortic root and ascending aorta are structurally normal, with no evidence of dilitation. Venous: The inferior vena cava is normal in size with greater than 50% respiratory variability, suggesting right atrial pressure of 3 mmHg. IAS/Shunts: No atrial level shunt detected by color flow Doppler.  LEFT VENTRICLE PLAX 2D LVIDd:         2.69 cm     Diastology LVIDs:         1.78 cm     LV e' medial:    8.92 cm/s LV PW:         2.87 cm     LV E/e' medial:  8.6 LV IVS:        1.20 cm     LV e' lateral:   10.80 cm/s LVOT diam:     2.00 cm     LV E/e' lateral: 7.1 LV SV:         78 LV SV Index:   45          2D Longitudinal Strain LVOT Area:     3.14 cm    2D Strain GLS Avg:     -22.7 %  LV Volumes (MOD) LV vol s, MOD A2C: 52.6 ml RIGHT VENTRICLE RV S prime:     9.00 cm/s TAPSE (M-mode): 1.8 cm LEFT ATRIUM            Index        RIGHT ATRIUM           Index LA diam:      3.60 cm 2.10 cm/m   RA Area:     11.70 cm LA Vol (A2C): 64.9 ml 37.92 ml/m  RA Volume:   22.60 ml  13.21 ml/m LA Vol (A4C): 79.7 ml 46.57 ml/m  AORTIC VALVE LVOT Vmax:   108.00 cm/s LVOT Vmean:  71.300 cm/s LVOT VTI:    0.247 m  AORTA Ao Root diam: 2.90 cm MITRAL VALVE MV Area (PHT): 3.12 cm    SHUNTS MV Decel Time: 243 msec    Systemic VTI:  0.25 m MV E velocity: 77.00 cm/s  Systemic Diam: 2.00 cm MV A velocity: 55.50 cm/s MV E/A ratio:  1.39 Gwyndolyn Kaufman MD Electronically signed by Gwyndolyn Kaufman MD Signature Date/Time: 10/24/2021/3:44:43 PM    Final      ELIGIBLE FOR AVAILABLE RESEARCH PROTOCOL: no   ASSESSMENT: 50 y.o. [place], [place] woman  (1) status post left lumpectomy in 2000 for a (?) T2N0 breast cancer,  (a) status post adjuvant chemotherapy  (b) status post adjuvant radiation  (c) did not receive antiestrogens  (2) genetics testing May 18, 2018 through the common Hereditary Cancers Panel + Myelodysplastic Syndrome/Leukemia Panel found no deleterious mutations in APC, ATM, AXIN2, BARD1, BLM, BMPR1A, BRCA1, BRCA2, BRIP1, CDH1, CDK4, CDKN2A (p14ARF), CDKN2A (p16INK4a), CEBPA, CHEK2, CTNNA1, DICER1, EPCAM*, GATA2, GREM1*, HRAS, KIT, MEN1, MLH1, MSH2, MSH3, MSH6, MUTYH, NBN, NF1, PALB2, PDGFRA, PMS2, POLD1, POLE, PTEN, RAD50, RAD51C, RAD51D, RUNX1, SDHB, SDHC, SDHD, SMAD4, SMARCA4, STK11, TERC, TERT, TP53, TSC1, TSC2, VHL The following  genes were evaluated for sequence changes only: HOXB13*, NTHL1*, SDHA  (a)  3 variants of uncertain significance were identified in the genes BARD1 c.764A>G (p.Asn255Ser), BRIP1 c.2563C>T (p.Arg855Cys), and PALB2 c.3103A>T (p.Ile1035Phe).   (3) status post right breast biopsy 03/16/2018 for a clinical mT2-3 N2, anatomic stage III invasive ductal carcinoma, grade 2, triple positive, with an MIB-1 of 80%.  (a) bone scan and chest CT scan negative for metastases except for a T6 lytic  lesion of concern  (b) thoracic spine MRI was ordered May 2019 but never performed.  (4) neoadjuvant chemotherapy consisting of carboplatin, docetaxel, trastuzumab and Pertuzumab every 3 weeks x 6 starting 04/16/2018  (a) pertuzumab held beginning with cycle 2 because of diarrhea  (5) trastuzumab was to be continued Q4w indefinitely, discontinued after 10/21/2019 dose with progression  (a) echocardiogram on 06/29/2018 showed an ejection fraction in the 65-70% range  (b) echocardiogram 11/19/2018 showed an ejection fraction in the 60-65% range  (c) echo 04/07/2019 shows an ejection fraction in the 55-60% range  (d) echo 07/15/2019 shows EF of 60-65%--for additional echoes see under #12 below  (6) right mastectomy and sentinel lymph node sampling on 08/26/2018 found a residual 2.5cm invasive ductal carcinoma, with 2 of 5 sentinel lymph nodes positive, ypT2, ypN1a; margins were clear  (a) repeat prognostic panel confirms tumor still triple positive (HER2 3+)  (7) adjuvant radiation 09/30/2018 - 11/16/2018  Site/dose:   The patient initially received a dose of 50.4 Gy in 28 fractions to the right chest wall and supraclavicular region. This was delivered using a 3-D conformal, 4 field technique. The patient then received a boost to the mastectomy scar. This delivered an additional 10 Gy in 5 fractions using an en face electron field. The total dose was 60.4   (8) anastrozole started 12/03/2018, discontinued August 2022 with progression  (a) [place] on 10/01/2018 was 64.3, and estradiol 7.0, consistent with menopause  (b) referral to pelvic floor rehabilitation 12/03/2018  (c) [place] on 03/16/2020 was 80.8 with estradiol less than 2.5; repeat 11/29/2021 was 70.1   METASTATIC DISEASE: March 2020 (9) CT of neck and CT angiography of the chest 02/04/2019 finds the T6 lytic lesion noted May 2019 (see #3 above) is now sclerotic; a sclerotic T3 lesion is stable; there is a new lytic lesion in the  manubrium  (a) PET scan 03/12/2019 shows manubrium, T3 and T5 metastases, no visceral disease  (b) biopsy of manubrial lesion planned but not done secondary to pandemic  (c) Bone scan 09/01/19 shows ? progression of disease in spine; MRI 09/02/19 shows early metastases in T10 (new), T3 and T5 stable; CT chest 09/01/19 shows 34mm pulmonary nodules that are new, but nonspecific   (d) PET 10/26/2019 shows no lung or liver lesions; new mediastinal adenopathy, bone lesions-- T-DM1 started  (e) PET scan 03/08/2020 shows resolution of the mediastinal adenopathy and hypermetabolic bone lesions, questionable subcutaneous nodule along the left lateral shoulder  (f) for additional staging studies see under "12" below  (10) SRS to spine, palliative treatment to sternum, from 05/13/2019 through 05/26/2019:  1. Chest_sternum // 40 Gy in 10 fractions  2. Thoracic Spine (T3) // 18 Gy in 1 fraction   3. Thoracic Spine (T5) // 18 Gy in 1 fraction   (11) denosumab/Xgeva started 09/08/2019, given every 6 weeks  (12) T-DM1 started 01/15/20201, discontinued after 07/02/2021 dose with disease progression  (a) echo 10/26/2019 shows stable EF at 55-60%  (b) echo 03/08/2020 shows an ejection fraction in the 70-75% range  (  c) echo 07/12/2020 shows an ejection fraction in the 65 to 70% range.  (d) PET scan 03/08/2020 shows no active disease; a 0.9 cm subcutaneous nodule in the left shoulder area seen on the PET was not identifiable by exam 03/16/2020  (e) PET 08/24/2020 shows stable disease except for a new 0.6 cm lymph node with an SUV of 10.1  (f) bronchoscopy 09/05/2020 with endobronchial ultrasound found no detectable mass in the region noted on the September PET scan  (g) T-DM1 discontinued and trastuzumab resumed 11/29/2020  (h) PET scan 02/26/2021 shows mild nodular increase: TDM-1 resumed 03/07/2021, discontinued after 05/09/2021 dose pending restaging studies  (i) noncontrast chest CT 06/01/2021 shows possibly mildly  enlarged adenopathy, and an apparently new sclerotic lesion at T8.  There was also evidence of pancreatitis and possible pneumonitis  (j) PET scan 07/17/2021 shows progression in lymph nodes and bone, but no evidence of involvement of lung or liver and no evidence of residual pancreatitis.  There is continued unexplained gastric pyloric hypermetabolic activity (see 68L below).  (13) Enhertu started 07/23/2021, repeated every 21 days  (14) fulvestrant Q 21 days (alternating 500/250 mg doses) started 07/23/2021  (15) anemia of renal failure:  (A) retacrit started 12/27/2020 at 10K unit every 21 days, dose gradually increased to current 40K units Q 21 days  (B) EGD 10/19/2019 it shows duodenal ulcers, no evidence of malignancy  (16) restaging studies:  (A) PET scan 10/17/2021 shows significant response to Enhertu  (B) echo 10/24/2021 shows ejection fraction 60-65%   PLAN: [name] is now more than 2-1/2 years out from definitive diagnosis of metastatic breast cancer.  Her disease is well controlled and currently she has no symptoms related to the cancer itself.  She is also tolerating treatment well.  She will receive her sixth Enhertu dose today.  We are also continuing fulvestrant and denosumab/Xgeva.  The fulvestrant is dose adjusted so that she can receive it every 3 weeks (500 mg alternating with 250 mg).  The denosumab is given every 6 weeks.  While she is tolerating this treatment well, the question is how long to continue it.  She will be meeting with her new oncologist, Dr. Chryl Heck on 12/04/2020 and she will review the case and adjust treatment at her discretion  While the hemoglobin remains low, it is not lower and the reticulocyte count is up.  I am hoping with treatment of the ulcers we will see a rise in the hemoglobin which will relieve the patient's anemia symptoms described above  Total encounter time 30 minutes.Sarajane Jews C. Amarion Portell, MD 11/11/21 7:43 AM Medical Oncology and  Hematology [facility] [place], [place] [address] Tel. [phone]    Fax. [phone]   I, Wilburn Mylar, am acting as scribe for Dr. Virgie Dad. Demarie Hyneman.  I, Lurline Del MD, have reviewed the above documentation for accuracy and completeness, and I agree with the above.   *Total Encounter Time as defined by the Centers for Medicare and Medicaid Services includes, in addition to the face-to-face time of a patient visit (documented in the note above) non-face-to-face time: obtaining and reviewing outside history, ordering and reviewing medications, tests or procedures, care coordination (communications with other health care professionals or caregivers) and documentation in the medical record.

## 2021-11-12 ENCOUNTER — Inpatient Hospital Stay: Payer: BC Managed Care – PPO

## 2021-11-12 ENCOUNTER — Other Ambulatory Visit: Payer: Self-pay

## 2021-11-12 ENCOUNTER — Inpatient Hospital Stay (HOSPITAL_BASED_OUTPATIENT_CLINIC_OR_DEPARTMENT_OTHER): Payer: BC Managed Care – PPO | Admitting: Oncology

## 2021-11-12 VITALS — BP 117/60 | HR 80 | Temp 99.0°F | Resp 16 | Ht 59.0 in | Wt 166.2 lb

## 2021-11-12 DIAGNOSIS — C50411 Malignant neoplasm of upper-outer quadrant of right female breast: Secondary | ICD-10-CM

## 2021-11-12 DIAGNOSIS — C7951 Secondary malignant neoplasm of bone: Secondary | ICD-10-CM

## 2021-11-12 DIAGNOSIS — C771 Secondary and unspecified malignant neoplasm of intrathoracic lymph nodes: Secondary | ICD-10-CM | POA: Diagnosis not present

## 2021-11-12 DIAGNOSIS — Z5112 Encounter for antineoplastic immunotherapy: Secondary | ICD-10-CM | POA: Diagnosis not present

## 2021-11-12 DIAGNOSIS — N184 Chronic kidney disease, stage 4 (severe): Secondary | ICD-10-CM | POA: Diagnosis not present

## 2021-11-12 DIAGNOSIS — K858 Other acute pancreatitis without necrosis or infection: Secondary | ICD-10-CM

## 2021-11-12 DIAGNOSIS — Z79899 Other long term (current) drug therapy: Secondary | ICD-10-CM | POA: Diagnosis not present

## 2021-11-12 DIAGNOSIS — D631 Anemia in chronic kidney disease: Secondary | ICD-10-CM | POA: Diagnosis not present

## 2021-11-12 DIAGNOSIS — E1122 Type 2 diabetes mellitus with diabetic chronic kidney disease: Secondary | ICD-10-CM

## 2021-11-12 DIAGNOSIS — Z170 Estrogen receptor positive status [ER+]: Secondary | ICD-10-CM

## 2021-11-12 DIAGNOSIS — C50811 Malignant neoplasm of overlapping sites of right female breast: Secondary | ICD-10-CM | POA: Diagnosis not present

## 2021-11-12 DIAGNOSIS — Z95828 Presence of other vascular implants and grafts: Secondary | ICD-10-CM

## 2021-11-12 LAB — CBC WITH DIFFERENTIAL/PLATELET
Abs Immature Granulocytes: 0.14 10*3/uL — ABNORMAL HIGH (ref 0.00–0.07)
Basophils Absolute: 0.1 10*3/uL (ref 0.0–0.1)
Basophils Relative: 1 %
Eosinophils Absolute: 0.2 10*3/uL (ref 0.0–0.5)
Eosinophils Relative: 3 %
HCT: 22.1 % — ABNORMAL LOW (ref 36.0–46.0)
Hemoglobin: 7.5 g/dL — ABNORMAL LOW (ref 12.0–15.0)
Immature Granulocytes: 2 %
Lymphocytes Relative: 8 %
Lymphs Abs: 0.6 10*3/uL — ABNORMAL LOW (ref 0.7–4.0)
MCH: 32.6 pg (ref 26.0–34.0)
MCHC: 33.9 g/dL (ref 30.0–36.0)
MCV: 96.1 fL (ref 80.0–100.0)
Monocytes Absolute: 1.3 10*3/uL — ABNORMAL HIGH (ref 0.1–1.0)
Monocytes Relative: 19 %
Neutro Abs: 4.4 10*3/uL (ref 1.7–7.7)
Neutrophils Relative %: 67 %
Platelets: 147 10*3/uL — ABNORMAL LOW (ref 150–400)
RBC: 2.3 MIL/uL — ABNORMAL LOW (ref 3.87–5.11)
RDW: 22.9 % — ABNORMAL HIGH (ref 11.5–15.5)
WBC: 6.7 10*3/uL (ref 4.0–10.5)
nRBC: 0.5 % — ABNORMAL HIGH (ref 0.0–0.2)

## 2021-11-12 LAB — RETICULOCYTES
Immature Retic Fract: 35.1 % — ABNORMAL HIGH (ref 2.3–15.9)
RBC.: 2.31 MIL/uL — ABNORMAL LOW (ref 3.87–5.11)
Retic Count, Absolute: 122.2 10*3/uL (ref 19.0–186.0)
Retic Ct Pct: 5.3 % — ABNORMAL HIGH (ref 0.4–3.1)

## 2021-11-12 LAB — COMPREHENSIVE METABOLIC PANEL
ALT: 17 U/L (ref 0–44)
AST: 37 U/L (ref 15–41)
Albumin: 2.6 g/dL — ABNORMAL LOW (ref 3.5–5.0)
Alkaline Phosphatase: 124 U/L (ref 38–126)
Anion gap: 10 (ref 5–15)
BUN: 38 mg/dL — ABNORMAL HIGH (ref 6–20)
CO2: 16 mmol/L — ABNORMAL LOW (ref 22–32)
Calcium: 9.7 mg/dL (ref 8.9–10.3)
Chloride: 112 mmol/L — ABNORMAL HIGH (ref 98–111)
Creatinine, Ser: 2.35 mg/dL — ABNORMAL HIGH (ref 0.44–1.00)
GFR, Estimated: 25 mL/min — ABNORMAL LOW (ref >60)
Glucose, Bld: 117 mg/dL — ABNORMAL HIGH (ref 70–99)
Potassium: 4 mmol/L (ref 3.5–5.1)
Sodium: 138 mmol/L (ref 135–145)
Total Bilirubin: 0.9 mg/dL (ref 0.3–1.2)
Total Protein: 5.8 g/dL — ABNORMAL LOW (ref 6.5–8.1)

## 2021-11-12 LAB — IRON AND TIBC
Iron: 62 ug/dL (ref 41–142)
Saturation Ratios: 21 % (ref 21–57)
TIBC: 290 ug/dL (ref 236–444)
UIBC: 228 ug/dL (ref 120–384)

## 2021-11-12 LAB — FERRITIN: Ferritin: 223 ng/mL (ref 11–307)

## 2021-11-12 MED ORDER — SODIUM CHLORIDE 0.9 % IV SOLN
10.0000 mg | Freq: Once | INTRAVENOUS | Status: AC
  Administered 2021-11-12: 10 mg via INTRAVENOUS
  Filled 2021-11-12: qty 10

## 2021-11-12 MED ORDER — FAM-TRASTUZUMAB DERUXTECAN-NXKI CHEMO 100 MG IV SOLR
5.3500 mg/kg | Freq: Once | INTRAVENOUS | Status: AC
  Administered 2021-11-12: 400 mg via INTRAVENOUS
  Filled 2021-11-12: qty 20

## 2021-11-12 MED ORDER — EPOETIN ALFA-EPBX 20000 UNIT/ML IJ SOLN
20000.0000 [IU] | Freq: Once | INTRAMUSCULAR | Status: AC
  Administered 2021-11-12: 20000 [IU] via SUBCUTANEOUS
  Filled 2021-11-12: qty 1

## 2021-11-12 MED ORDER — ACETAMINOPHEN 325 MG PO TABS
650.0000 mg | ORAL_TABLET | Freq: Once | ORAL | Status: AC
  Administered 2021-11-12: 650 mg via ORAL
  Filled 2021-11-12: qty 2

## 2021-11-12 MED ORDER — DIPHENHYDRAMINE HCL 25 MG PO CAPS
25.0000 mg | ORAL_CAPSULE | Freq: Once | ORAL | Status: AC
  Administered 2021-11-12: 25 mg via ORAL
  Filled 2021-11-12: qty 1

## 2021-11-12 MED ORDER — DEXTROSE 5 % IV SOLN: Freq: Once | INTRAVENOUS | Status: AC

## 2021-11-12 MED ORDER — PALONOSETRON HCL INJECTION 0.25 MG/5ML
0.2500 mg | Freq: Once | INTRAVENOUS | Status: AC
  Administered 2021-11-12: 0.25 mg via INTRAVENOUS
  Filled 2021-11-12: qty 5

## 2021-11-12 NOTE — Patient Instructions (Signed)
[facility]  Discharge Instructions: ?Thank you for choosing [facility] to provide your oncology and hematology care.  ? ?If you have a lab appointment with the Cancer Center, please go directly to the Cancer Center and check in at the registration area. ?  ?Wear comfortable clothing and clothing appropriate for easy access to any Portacath or PICC line.  ? ?We strive to give you quality time with your provider. You may need to reschedule your appointment if you arrive late (15 or more minutes).  Arriving late affects you and other patients whose appointments are after yours.  Also, if you miss three or more appointments without notifying the office, you may be dismissed from the clinic at the provider?s discretion.    ?  ?For prescription refill requests, have your pharmacy contact our office and allow 72 hours for refills to be completed.   ? ?Today you received the following chemotherapy and/or immunotherapy agents: Enhertu.    ?  ?To help prevent nausea and vomiting after your treatment, we encourage you to take your nausea medication as directed. ? ?BELOW ARE SYMPTOMS THAT SHOULD BE REPORTED IMMEDIATELY: ?*FEVER GREATER THAN 100.4 F (38 ?C) OR HIGHER ?*CHILLS OR SWEATING ?*NAUSEA AND VOMITING THAT IS NOT CONTROLLED WITH YOUR NAUSEA MEDICATION ?*UNUSUAL SHORTNESS OF BREATH ?*UNUSUAL BRUISING OR BLEEDING ?*URINARY PROBLEMS (pain or burning when urinating, or frequent urination) ?*BOWEL PROBLEMS (unusual diarrhea, constipation, pain near the anus) ?TENDERNESS IN MOUTH AND THROAT WITH OR WITHOUT PRESENCE OF ULCERS (sore throat, sores in mouth, or a toothache) ?UNUSUAL RASH, SWELLING OR PAIN  ?UNUSUAL VAGINAL DISCHARGE OR ITCHING  ? ?Items with * indicate a potential emergency and should be followed up as soon as possible or go to the Emergency Department if any problems should occur. ? ?Please show the CHEMOTHERAPY ALERT CARD or IMMUNOTHERAPY ALERT CARD at check-in to  the Emergency Department and triage nurse. ? ?Should you have questions after your visit or need to cancel or reschedule your appointment, please contact [facility]  Dept: [phone]  and follow the prompts.  Office hours are 8:00 a.m. to 4:30 p.m. Monday - Friday. Please note that voicemails left after 4:00 p.m. may not be returned until the following business day.  We are closed weekends and major holidays. You have access to a nurse at all times for urgent questions. Please call the main number to the clinic Dept: [phone] and follow the prompts. ? ? ?For any non-urgent questions, you may also contact your provider using MyChart. We now offer e-Visits for anyone 18 and older to request care online for non-urgent symptoms. For details visit [email or web address]. ?  ?Also download the MyChart app! Go to the app store, search "MyChart", open the app, select [facility], and log in with your MyChart username and password. ? ?Due to Covid, a mask is required upon entering the hospital/clinic. If you do not have a mask, one will be given to you upon arrival. For doctor visits, patients may have 1 support person aged 18 or older with them. For treatment visits, patients cannot have anyone with them due to current Covid guidelines and our immunocompromised population.  ? ?

## 2021-11-12 NOTE — Progress Notes (Signed)
Ok to treat today per Dr. Jana Hakim with current lab values

## 2021-11-12 NOTE — Patient Instructions (Signed)

## 2021-11-27 ENCOUNTER — Telehealth: Payer: Self-pay

## 2021-11-27 ENCOUNTER — Other Ambulatory Visit: Payer: Self-pay

## 2021-11-27 DIAGNOSIS — Z170 Estrogen receptor positive status [ER+]: Secondary | ICD-10-CM

## 2021-11-27 NOTE — Telephone Encounter (Signed)
Pt called and states she has been having epistaxis episodes but denies any today. Pt reports 6 yesterday and"just feels really bad". Pt states she took a COVID test and it is negative. Pt states she has chills, nasal congestion, sore throat, body aches. Pt was offered an appt with [facility] per Dr Chryl Heck Verline Lema, PA. Pt accepted and knows to arrive to [facility] 12/28 for labs and PA visit.

## 2021-11-28 ENCOUNTER — Telehealth: Payer: Self-pay | Admitting: *Deleted

## 2021-11-28 ENCOUNTER — Inpatient Hospital Stay: Payer: BC Managed Care – PPO | Admitting: Physician Assistant

## 2021-11-28 ENCOUNTER — Ambulatory Visit: Payer: BC Managed Care – PPO

## 2021-11-28 ENCOUNTER — Inpatient Hospital Stay: Payer: BC Managed Care – PPO

## 2021-11-28 ENCOUNTER — Other Ambulatory Visit: Payer: Self-pay | Admitting: Adult Health

## 2021-11-28 NOTE — Telephone Encounter (Signed)
Pt called requesting rescheduling appt's for today in [facility]. Pt stated that she couldn't come in due to making funereal arrangements for father. Also, reminded pt that hgb was 7.5 and if she begins to feel even more symptomatic to go to nearest ED. Pt stated that her nose bleeds were slowing and only had 1 this morning. Gave recommendations for nose bleeds. Pt verbalized understanding. Advised to call office if she wanted to reschedule.

## 2021-11-30 MED FILL — Dexamethasone Sodium Phosphate Inj 100 MG/10ML: INTRAMUSCULAR | Qty: 1 | Status: AC

## 2021-12-01 DIAGNOSIS — J189 Pneumonia, unspecified organism: Secondary | ICD-10-CM | POA: Diagnosis not present

## 2021-12-01 DIAGNOSIS — Z452 Encounter for adjustment and management of vascular access device: Secondary | ICD-10-CM | POA: Diagnosis not present

## 2021-12-01 DIAGNOSIS — J9601 Acute respiratory failure with hypoxia: Secondary | ICD-10-CM | POA: Diagnosis not present

## 2021-12-01 DIAGNOSIS — R918 Other nonspecific abnormal finding of lung field: Secondary | ICD-10-CM | POA: Diagnosis not present

## 2021-12-01 DIAGNOSIS — D649 Anemia, unspecified: Secondary | ICD-10-CM | POA: Diagnosis not present

## 2021-12-02 DIAGNOSIS — Z9011 Acquired absence of right breast and nipple: Secondary | ICD-10-CM | POA: Diagnosis not present

## 2021-12-02 DIAGNOSIS — J704 Drug-induced interstitial lung disorders, unspecified: Secondary | ICD-10-CM | POA: Diagnosis not present

## 2021-12-02 DIAGNOSIS — C7951 Secondary malignant neoplasm of bone: Secondary | ICD-10-CM | POA: Diagnosis not present

## 2021-12-02 DIAGNOSIS — K7689 Other specified diseases of liver: Secondary | ICD-10-CM | POA: Diagnosis not present

## 2021-12-02 DIAGNOSIS — R0603 Acute respiratory distress: Secondary | ICD-10-CM | POA: Diagnosis not present

## 2021-12-02 DIAGNOSIS — Z4659 Encounter for fitting and adjustment of other gastrointestinal appliance and device: Secondary | ICD-10-CM | POA: Diagnosis not present

## 2021-12-02 DIAGNOSIS — B449 Aspergillosis, unspecified: Secondary | ICD-10-CM | POA: Diagnosis not present

## 2021-12-02 DIAGNOSIS — Z66 Do not resuscitate: Secondary | ICD-10-CM | POA: Diagnosis not present

## 2021-12-02 DIAGNOSIS — C50912 Malignant neoplasm of unspecified site of left female breast: Secondary | ICD-10-CM | POA: Diagnosis not present

## 2021-12-02 DIAGNOSIS — J984 Other disorders of lung: Secondary | ICD-10-CM | POA: Diagnosis not present

## 2021-12-02 DIAGNOSIS — B441 Other pulmonary aspergillosis: Secondary | ICD-10-CM | POA: Diagnosis not present

## 2021-12-02 DIAGNOSIS — C50919 Malignant neoplasm of unspecified site of unspecified female breast: Secondary | ICD-10-CM | POA: Diagnosis not present

## 2021-12-02 DIAGNOSIS — R918 Other nonspecific abnormal finding of lung field: Secondary | ICD-10-CM | POA: Diagnosis not present

## 2021-12-02 DIAGNOSIS — G9341 Metabolic encephalopathy: Secondary | ICD-10-CM | POA: Diagnosis not present

## 2021-12-02 DIAGNOSIS — Z515 Encounter for palliative care: Secondary | ICD-10-CM | POA: Diagnosis not present

## 2021-12-02 DIAGNOSIS — G9511 Acute infarction of spinal cord (embolic) (nonembolic): Secondary | ICD-10-CM | POA: Diagnosis not present

## 2021-12-02 DIAGNOSIS — R040 Epistaxis: Secondary | ICD-10-CM | POA: Diagnosis not present

## 2021-12-02 DIAGNOSIS — C7981 Secondary malignant neoplasm of breast: Secondary | ICD-10-CM | POA: Diagnosis not present

## 2021-12-02 DIAGNOSIS — N184 Chronic kidney disease, stage 4 (severe): Secondary | ICD-10-CM | POA: Diagnosis not present

## 2021-12-02 DIAGNOSIS — J90 Pleural effusion, not elsewhere classified: Secondary | ICD-10-CM | POA: Diagnosis not present

## 2021-12-02 DIAGNOSIS — Z452 Encounter for adjustment and management of vascular access device: Secondary | ICD-10-CM | POA: Diagnosis not present

## 2021-12-02 DIAGNOSIS — B377 Candidal sepsis: Secondary | ICD-10-CM | POA: Diagnosis not present

## 2021-12-02 DIAGNOSIS — I517 Cardiomegaly: Secondary | ICD-10-CM | POA: Diagnosis not present

## 2021-12-02 DIAGNOSIS — Z9012 Acquired absence of left breast and nipple: Secondary | ICD-10-CM | POA: Diagnosis not present

## 2021-12-02 DIAGNOSIS — D649 Anemia, unspecified: Secondary | ICD-10-CM | POA: Diagnosis not present

## 2021-12-02 DIAGNOSIS — R570 Cardiogenic shock: Secondary | ICD-10-CM | POA: Diagnosis not present

## 2021-12-02 DIAGNOSIS — J9601 Acute respiratory failure with hypoxia: Secondary | ICD-10-CM | POA: Diagnosis not present

## 2021-12-02 DIAGNOSIS — N179 Acute kidney failure, unspecified: Secondary | ICD-10-CM | POA: Diagnosis not present

## 2021-12-02 DIAGNOSIS — J189 Pneumonia, unspecified organism: Secondary | ICD-10-CM | POA: Diagnosis not present

## 2021-12-02 DIAGNOSIS — E8770 Fluid overload, unspecified: Secondary | ICD-10-CM | POA: Diagnosis not present

## 2021-12-02 DIAGNOSIS — D696 Thrombocytopenia, unspecified: Secondary | ICD-10-CM | POA: Diagnosis not present

## 2021-12-02 DIAGNOSIS — J9600 Acute respiratory failure, unspecified whether with hypoxia or hypercapnia: Secondary | ICD-10-CM | POA: Diagnosis not present

## 2021-12-02 DIAGNOSIS — B59 Pneumocystosis: Secondary | ICD-10-CM | POA: Diagnosis not present

## 2021-12-02 DIAGNOSIS — J849 Interstitial pulmonary disease, unspecified: Secondary | ICD-10-CM | POA: Diagnosis not present

## 2021-12-02 DIAGNOSIS — D61811 Other drug-induced pancytopenia: Secondary | ICD-10-CM | POA: Diagnosis not present

## 2021-12-02 DIAGNOSIS — I361 Nonrheumatic tricuspid (valve) insufficiency: Secondary | ICD-10-CM | POA: Diagnosis not present

## 2021-12-02 DIAGNOSIS — Z6833 Body mass index (BMI) 33.0-33.9, adult: Secondary | ICD-10-CM | POA: Diagnosis not present

## 2021-12-02 DIAGNOSIS — J80 Acute respiratory distress syndrome: Secondary | ICD-10-CM | POA: Diagnosis not present

## 2021-12-02 DIAGNOSIS — D701 Agranulocytosis secondary to cancer chemotherapy: Secondary | ICD-10-CM | POA: Diagnosis not present

## 2021-12-02 DIAGNOSIS — Z4682 Encounter for fitting and adjustment of non-vascular catheter: Secondary | ICD-10-CM | POA: Diagnosis not present

## 2021-12-02 DIAGNOSIS — C50911 Malignant neoplasm of unspecified site of right female breast: Secondary | ICD-10-CM | POA: Diagnosis not present

## 2021-12-02 DIAGNOSIS — R579 Shock, unspecified: Secondary | ICD-10-CM | POA: Diagnosis not present

## 2021-12-03 NOTE — Progress Notes (Deleted)
[place]  [phone] [phone]    ID: [name] [name] DOB: May 19, 1971  MR#: [id number]  JYN#:[id number]  Patient Care Team: Marda Stalker, PA-C as PCP - General (Family Medicine) Jerline Pain, MD as PCP - Cardiology (Cardiology) Magrinat, Virgie Dad, MD as Consulting Physician (Oncology) Loreta Ave, MD as Referring Physician (Internal Medicine) Kyung Rudd, MD as Consulting Physician (Radiation Oncology) Jovita Kussmaul, MD as Consulting Physician (General Surgery) Larey Dresser, MD as Consulting Physician (Cardiology) Mauro Kaufmann, RN as Oncology Nurse Navigator Rockwell Germany, RN as Oncology Nurse Navigator Raina Mina, RPH-CPP (Pharmacist) Murlean Iba, MD (Nephrology) OTHER MD:   CHIEF COMPLAINT: Triple positive breast cancer (s/p right mastectomy)  CURRENT TREATMENT: Enhertu; fulvestrant; denosumab/Xgeva; retacrit   INTERVAL HISTORY: [name] returns today for follow up and treatment of her triple positive breast cancer.  She was started on Enhertu and fulvestrant, both given every 3 weeks, on 07/23/2021.  ( She will receive her sixth dose of Enhertu today.  She has no side effects from this that she is aware of and she tells me today that she has not been taking the Decadron on days 2 and 3 because she has not needed to.  She is receiving fulvestrant every 3 weeks, with the dose adjusted to account for the altered dosing interval.  Her most recent echo on 10/24/2021 showed an ejection fraction of 60-65%. This is stable.  She was switched back to [place] on 07/23/2021, now given every 6 weeks for convenience her most recent treatment was 10/01/2021 and she has a dose due today.Marland Kitchen  Her PET scan had shown uptake in the upper GI tract..  Dr. Hilarie Fredrickson reached out to me with the results from her endoscopy on 10/18/2021. Pathology from the stomach and small intestine biopsies was benign.  She was started on PPIs and she is taking this  twice daily.  She is symptomatically improved.  She has met with nephrology (Dr.Singh).  She was started on Retacrit here for her anemia of renal failure 06/11/2021.  She is now receiving this every 4 weeks.  So far we have not seen a significant response in the hemoglobin although the reticulocyte count is up. Her most recent dose was on 11/05/2021.   REVIEW OF SYSTEMS: [name] feels short of breath when she climbs stairs or does anything strenuous.  She also feels palpitations at the same time.  This is of course due to her anemia.  She is able to walk any distance on a flat surface and has been doing a lot of Christmas shopping she tells me.  She has not had chest pain or pressure issues.  There have been no falls or fainting symptoms.  A detailed review of systems was otherwise stable.   COVID 19 VACCINATION STATUS: [place] x2, most recently 03/2020; infection 04/2021   HISTORY OF CURRENT ILLNESS: From the original intake note:  [name] Schow palpated a lump on 01/15/2018. She felt soreness and shooting pain under her arm and in the right breast. She underwent bilateral diagnostic mammography with tomography and right breast ultrasonography at [facility] on 03/12/2018 showing: breast density category C. The is architectural distortion at the 11 o'clock position. There is also an oval mass in the right breast anterior depth. Examination of the right axilla showed enlarged lymph nodes. Ultrasonography showed a 4.6 cm mass in the right breast upper outer quadrant posterior depth. An additional 1.2 cm oval mass in the right breast 12 o'clock middle depth.  The lymph nodes in the right axillary are highly suggestive of malignancy.   Accordingly on 03/16/2018 she proceeded to biopsy of the right breast area in question. The pathology from this procedure showed ([id number]): At both the 11 and 12 o'clock positions: Invasive ductal carcinoma grade II. Ductal carcinoma in situ, high grade, with necrosis and  calcifications. Prognostic indicators significant for: estrogen receptor, 90% positive and progesterone receptor, 40% positive, both with strong staining intensity. Proliferation marker Ki67 at 80%. HER2 amplified with ratios HER2/CEP17 SIGNALS 6.90 and average HER2 copies per cell 14.50  The patient's subsequent history is as detailed below.   PAST MEDICAL HISTORY: Past Medical History:  Diagnosis Date   Anemia    Anxiety    Blood transfusion without reported diagnosis    Breast cancer, left breast ([place]) 2000   Underwent lumpectomy, chemotherapy and radiation   Breast cancer, right breast ([place]) 2019   full mastectomy   Facial paralysis/Bells palsy    left side   Family history of lung cancer    Family history of lymphoma    Hypertension    Neuropathy    IN FINGERS AND TOES   RECENT INFUSION OF CHEMO   Personal history of breast cancer 05/06/2018   Renal disorder    just after TIA acute kidney injury   TIA (transient ischemic attack) 03/16/2017   [place] [facility]  The patient has a previous history of left breast cancer diagnosed in 2000. She was seen by [facility] in [place], [place] under Dr. Loreta Ave. According to the patient, her left breast cancer was stage II with no lymph node involvement (likely T2N0). She had chemotherapy every 3 weeks for about 6 months. She did not takes any "red drugs." She also did not take anti-estrogens (likely ER/PR negative).   TIA in 2018. She saw a neurologist as a precaution. She denies any clotting issues.    PAST SURGICAL HISTORY: Past Surgical History:  Procedure Laterality Date   BREAST LUMPECTOMY WITH AXILLARY LYMPH NODE BIOPSY Left 2000   biopsy   BREAST SURGERY     partial mastectomy with lymph node removal   ENDOBRONCHIAL ULTRASOUND Bilateral 09/05/2020   Procedure: ENDOBRONCHIAL ULTRASOUND;  Surgeon: Collene Gobble, MD;  Location: [facility];  Service: Cardiopulmonary;  Laterality: Bilateral;   MASTECTOMY  Right 2019   & partial mastectomy left breast 2000   MASTECTOMY MODIFIED RADICAL Right 08/26/2018   Procedure: MASTECTOMY MODIFIED RADICAL;  Surgeon: Jovita Kussmaul, MD;  Location: [facility];  Service: General;  Laterality: Right;   MODIFIED MASTECTOMY Right 08/26/2018   PORTACATH PLACEMENT Left 04/08/2018   Procedure: INSERTION PORT-A-CATH;  Surgeon: Jovita Kussmaul, MD;  Location: [facility];  Service: General;  Laterality: Left;   TUBAL LIGATION Bilateral 2004   UPPER GASTROINTESTINAL ENDOSCOPY     VIDEO BRONCHOSCOPY N/A 09/05/2020   Procedure: VIDEO BRONCHOSCOPY WITHOUT FLUORO;  Surgeon: Collene Gobble, MD;  Location: [facility];  Service: Cardiopulmonary;  Laterality: N/A;   WISDOM TOOTH EXTRACTION      FAMILY HISTORY Family History  Problem Relation Age of Onset   Hypertension Mother    Hypertension Father    Diverticulosis Father    Diabetes Father    Lung cancer Father 52       hx smoking   Pneumonia Maternal Aunt        died at a young adult   Lymphoma Paternal Aunt 52   Hypertension Maternal Grandmother    CAD Maternal Grandmother  Alzheimer's disease Maternal Grandmother        died at 50   Hypertension Paternal Grandmother    CAD Paternal Grandmother    Stroke Paternal Grandfather    Sickle cell trait Paternal Grandfather    Colon cancer Neg Hx    Esophageal cancer Neg Hx    Rectal cancer Neg Hx    Stomach cancer Neg Hx   The patient's father is alive at age 33 and has a history of lung cancer. The patient's mother is alive at age 108. The patient has 1 brother and no sisters. She denies a family history of breast or ovarian cancer.     GYNECOLOGIC HISTORY:  Patient's last menstrual period was 12/21/2017. Menarche: 51 years old Age at first live birth: 51 years old She is GXP3.  Her LMP was  January 2019.  The patient used oral contraceptive from [phone] and the Depo-provera shot from [phone] with no complications. She never used HRT.  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  She also worked at [employer] as a Occupational psychologist.  She is now disabled.  Her husband, [name], is a Administrator. The patient's son, Shea Stakes age 49, works at [employer] in [place]. The patient's daughters, Lilia Pro age 24, and Levonne Spiller age 53, are students. Lilia Pro plans to attend [employer] in the fall. The patient's adopted son, Vonna Kotyk age 9, is also a Ship broker.  He had to go back to foster care in 2022 because of behavioral disturbances.  ADVANCED DIRECTIVES: In the absence of any documents to the contrary the patient's husband is her healthcare power of attorney   HEALTH MAINTENANCE: Social History   Tobacco Use   Smoking status: Never   Smokeless tobacco: Never  Vaping Use   Vaping Use: Never used  Substance Use Topics   Alcohol use: No   Drug use: No     Colonoscopy: n/a  PAP: 2015  Bone density: never done   Allergies  Allergen Reactions   Ace Inhibitors Swelling    Swelling of lips and face   Lisinopril Swelling    Swelling of lips, face   Amlodipine Swelling    Leg swelling   Shellfish Allergy Swelling   Adhesive [Tape] Itching and Rash    Please use paper tape   Latex Itching and Rash    Current Outpatient Medications  Medication Sig Dispense Refill   albuterol (VENTOLIN HFA) 108 (90 Base) MCG/ACT inhaler TAKE 2 PUFFS BY MOUTH EVERY 6 HOURS AS NEEDED FOR WHEEZE OR SHORTNESS OF BREATH 8.5 each 2   anastrozole (ARIMIDEX) 1 MG tablet TAKE 1 TABLET BY MOUTH EVERY DAY (Patient not taking: Reported on 10/18/2021) 90 tablet 4   Ascorbic Acid (VITAMIN C) 1000 MG tablet Take 1,000 mg by mouth daily.     carvedilol (COREG) 3.125 MG tablet Take 3.125 mg by mouth 2 (two) times daily with a meal.     Cyanocobalamin (VITAMIN B12) 500 MCG TABS Take 500 mcg by mouth daily.      dexamethasone (DECADRON) 4 MG tablet Take 2 tablets (8 mg total) by mouth  daily. Start the day after chemotherapy for 2 days. (Patient not taking: Reported on 10/18/2021) 30 tablet 1   Elderberry 500 MG CAPS See admin instructions.     ferrous sulfate 325 (65 FE) MG tablet Take 1 tablet (325 mg total) by mouth daily. [place]  tablet 0   gabapentin (NEURONTIN) 300 MG capsule Take 1 capsule (300 mg total) by mouth at bedtime. Take one tablet in the morning, one tablet early afternoon, and two tablets at bedtime 90 capsule 1   guaiFENesin (ROBITUSSIN) 100 MG/5ML liquid Take 200 mg by mouth 3 (three) times daily as needed for cough. (Patient not taking: Reported on 10/18/2021)     lidocaine-prilocaine (EMLA) cream Apply to affected area once 30 g 3   losartan (COZAAR) 50 MG tablet Take 50 mg by mouth daily as needed. Per pt takes if SBP is >100     Multiple Vitamin (MULITIVITAMIN WITH MINERALS) TABS Take 1 tablet by mouth daily.     pantoprazole (PROTONIX) 40 MG tablet Take 1 tablet (40 mg total) by mouth 2 (two) times daily. 90 tablet 4   prochlorperazine (COMPAZINE) 10 MG tablet Take 1 tablet (10 mg total) by mouth every 6 (six) hours as needed (Nausea or vomiting). 30 tablet 1   spironolactone-hydrochlorothiazide (ALDACTAZIDE) 25-25 MG tablet Take 1 tablet by mouth daily.     traMADol (ULTRAM) 50 MG tablet Take 1-2 tablets (50-100 mg total) by mouth every 6 (six) hours as needed. 60 tablet 0   valACYclovir (VALTREX) 1000 MG tablet Take 1 tablet (1,000 mg total) by mouth 3 (three) times daily. 21 tablet 0   venlafaxine XR (EFFEXOR-XR) 37.5 MG 24 hr capsule TAKE 1 CAPSULE BY MOUTH DAILY WITH BREAKFAST. 90 capsule 1   No current facility-administered medications for this visit.    OBJECTIVE: African-American woman examined in the infusion area  There were no vitals filed for this visit.  Wt Readings from Last 3 Encounters:  11/12/21 166 lb 4 oz (75.4 kg)  10/22/21 167 lb 11.2 oz (76.1 kg)  10/18/21 166 lb (75.3 kg)   There is no height or weight on file to calculate BMI.     For vitals associated with the 11/12/2021 visit please see the infusion area flowsheet  ECOG FS:2 - Symptomatic, <50% confined to bed  Sclerae unicteric, EOMs intact Wearing a mask Lungs no rales or rhonchi Heart regular rate and rhythm Abd soft, nontender MSK no focal spinal tenderness, chronic left upper extremity lymphedema with sleeve in place Neuro: nonfocal, well oriented, positive affect Breasts: Deferred   LAB RESULTS:  CMP     Component Value Date/Time   NA 138 11/12/2021 0843   K 4.0 11/12/2021 0843   CL 112 (H) 11/12/2021 0843   CO2 16 (L) 11/12/2021 0843   GLUCOSE 117 (H) 11/12/2021 0843   BUN 38 (H) 11/12/2021 0843   CREATININE 2.35 (H) 11/12/2021 0843   CREATININE 2.23 (H) 10/22/2021 0818   CALCIUM 9.7 11/12/2021 0843   CALCIUM 11.2 (H) 02/24/2020 0830   PROT 5.8 (L) 11/12/2021 0843   ALBUMIN 2.6 (L) 11/12/2021 0843   AST 37 11/12/2021 0843   AST 37 10/22/2021 0818   ALT 17 11/12/2021 0843   ALT 22 10/22/2021 0818   ALKPHOS 124 11/12/2021 0843   BILITOT 0.9 11/12/2021 0843   BILITOT 1.0 10/22/2021 0818   GFRNONAA 25 (L) 11/12/2021 0843   GFRNONAA 26 (L) 10/22/2021 0818   GFRAA 41 (L) 08/30/2020 0808   GFRAA 45 (L) 07/31/2020 1400    Lab Results  Component Value Date   TOTALPROTELP 7.0 02/25/2019     Lab Results  Component Value Date   KPAFRELGTCHN 76.7 (H) 02/25/2019   LAMBDASER 27.2 (H) 02/25/2019   KAPLAMBRATIO 2.82 (H) 02/25/2019    Lab Results  Component Value Date   WBC 6.7 11/12/2021   NEUTROABS 4.4 11/12/2021   HGB 7.5 (L) 11/12/2021   HCT 22.1 (L) 11/12/2021   MCV 96.1 11/12/2021   PLT 147 (L) 11/12/2021   No results found for: LABCA2  No components found for: [id number]  No results for input(s): INR in the last 168 hours.   No results found for: LABCA2  Lab Results  Component Value Date   CAN199 90 (H) 06/07/2021    No results found for: WUJ811  No results found for: BJY782  Lab Results  Component Value  Date   CA2729 63.5 (H) 06/07/2021    No components found for: HGQUANT  No results found for: CEA1 / No results found for: CEA1   No results found for: AFPTUMOR  No results found for: [place]  No results found for: HGBA, HGBA2QUANT, HGBFQUANT, HGBSQUAN (Hemoglobinopathy evaluation)   No results found for: LDH  Lab Results  Component Value Date   IRON 62 11/12/2021   TIBC 290 11/12/2021   IRONPCTSAT 21 11/12/2021   (Iron and TIBC)  Lab Results  Component Value Date   FERRITIN 223 11/12/2021    Urinalysis    Component Value Date/Time   COLORURINE YELLOW 05/30/2021 1153   APPEARANCEUR CLEAR 05/30/2021 1153   LABSPEC 1.010 05/30/2021 1153   PHURINE 5.0 05/30/2021 1153   GLUCOSEU NEGATIVE 05/30/2021 1153   HGBUR SMALL (A) 05/30/2021 1153   BILIRUBINUR NEGATIVE 05/30/2021 1153   KETONESUR NEGATIVE 05/30/2021 1153   PROTEINUR 30 (A) 05/30/2021 1153   UROBILINOGEN 0.2 11/29/2011 1656   NITRITE NEGATIVE 05/30/2021 1153   LEUKOCYTESUR NEGATIVE 05/30/2021 1153    STUDIES: No results found.   ELIGIBLE FOR AVAILABLE RESEARCH PROTOCOL: no   ASSESSMENT: 51 y.o. [place], [place] woman  (1) status post left lumpectomy in 2000 for a (?) T2N0 breast cancer,  (a) status post adjuvant chemotherapy  (b) status post adjuvant radiation  (c) did not receive antiestrogens  (2) genetics testing May 18, 2018 through the common Hereditary Cancers Panel + Myelodysplastic Syndrome/Leukemia Panel found no deleterious mutations in APC, ATM, AXIN2, BARD1, BLM, BMPR1A, BRCA1, BRCA2, BRIP1, CDH1, CDK4, CDKN2A (p14ARF), CDKN2A (p16INK4a), CEBPA, CHEK2, CTNNA1, DICER1, EPCAM*, GATA2, GREM1*, HRAS, KIT, MEN1, MLH1, MSH2, MSH3, MSH6, MUTYH, NBN, NF1, PALB2, PDGFRA, PMS2, POLD1, POLE, PTEN, RAD50, RAD51C, RAD51D, RUNX1, SDHB, SDHC, SDHD, SMAD4, SMARCA4, STK11, TERC, TERT, TP53, TSC1, TSC2, VHL The following genes were evaluated for sequence changes only: HOXB13*, NTHL1*, SDHA  (a)  3 variants of  uncertain significance were identified in the genes BARD1 c.764A>G (p.Asn255Ser), BRIP1 c.2563C>T (p.Arg855Cys), and PALB2 c.3103A>T (p.Ile1035Phe).   (3) status post right breast biopsy 03/16/2018 for a clinical mT2-3 N2, anatomic stage III invasive ductal carcinoma, grade 2, triple positive, with an MIB-1 of 80%.  (a) bone scan and chest CT scan negative for metastases except for a T6 lytic lesion of concern  (b) thoracic spine MRI was ordered May 2019 but never performed.  (4) neoadjuvant chemotherapy consisting of carboplatin, docetaxel, trastuzumab and Pertuzumab every 3 weeks x 6 starting 04/16/2018  (a) pertuzumab held beginning with cycle 2 because of diarrhea  (5) trastuzumab was to be continued Q4w indefinitely, discontinued after 10/21/2019 dose with progression  (a) echocardiogram on 06/29/2018 showed an ejection fraction in the 65-70% range  (b) echocardiogram 11/19/2018 showed an ejection fraction in the 60-65% range  (c) echo 04/07/2019 shows an ejection fraction in the 55-60% range  (d) echo 07/15/2019 shows EF of 60-65%--for additional echoes  see under #12 below  (6) right mastectomy and sentinel lymph node sampling on 08/26/2018 found a residual 2.5cm invasive ductal carcinoma, with 2 of 5 sentinel lymph nodes positive, ypT2, ypN1a; margins were clear  (a) repeat prognostic panel confirms tumor still triple positive (HER2 3+)  (7) adjuvant radiation 09/30/2018 - 11/16/2018  Site/dose:   The patient initially received a dose of 50.4 Gy in 28 fractions to the right chest wall and supraclavicular region. This was delivered using a 3-D conformal, 4 field technique. The patient then received a boost to the mastectomy scar. This delivered an additional 10 Gy in 5 fractions using an en face electron field. The total dose was 60.4   (8) anastrozole started 12/03/2018, discontinued August 2022 with progression  (a) [place] on 10/01/2018 was 64.3, and estradiol 7.0, consistent with  menopause  (b) referral to pelvic floor rehabilitation 12/03/2018  (c) [place] on 03/16/2020 was 80.8 with estradiol less than 2.5; repeat 11/29/2021 was 70.1   METASTATIC DISEASE: March 2020 (9) CT of neck and CT angiography of the chest 02/04/2019 finds the T6 lytic lesion noted May 2019 (see #3 above) is now sclerotic; a sclerotic T3 lesion is stable; there is a new lytic lesion in the manubrium  (a) PET scan 03/12/2019 shows manubrium, T3 and T5 metastases, no visceral disease  (b) biopsy of manubrial lesion planned but not done secondary to pandemic  (c) Bone scan 09/01/19 shows ? progression of disease in spine; MRI 09/02/19 shows early metastases in T10 (new), T3 and T5 stable; CT chest 09/01/19 shows 16mm pulmonary nodules that are new, but nonspecific   (d) PET 10/26/2019 shows no lung or liver lesions; new mediastinal adenopathy, bone lesions-- T-DM1 started  (e) PET scan 03/08/2020 shows resolution of the mediastinal adenopathy and hypermetabolic bone lesions, questionable subcutaneous nodule along the left lateral shoulder  (f) for additional staging studies see under "12" below  (10) SRS to spine, palliative treatment to sternum, from 05/13/2019 through 05/26/2019:  1. Chest_sternum // 40 Gy in 10 fractions  2. Thoracic Spine (T3) // 18 Gy in 1 fraction   3. Thoracic Spine (T5) // 18 Gy in 1 fraction   (11) denosumab/Xgeva started 09/08/2019, given every 6 weeks  (12) T-DM1 started 01/15/20201, discontinued after 07/02/2021 dose with disease progression  (a) echo 10/26/2019 shows stable EF at 55-60%  (b) echo 03/08/2020 shows an ejection fraction in the 70-75% range  (c) echo 07/12/2020 shows an ejection fraction in the 65 to 70% range.  (d) PET scan 03/08/2020 shows no active disease; a 0.9 cm subcutaneous nodule in the left shoulder area seen on the PET was not identifiable by exam 03/16/2020  (e) PET 08/24/2020 shows stable disease except for a new 0.6 cm lymph node with an SUV of  10.1  (f) bronchoscopy 09/05/2020 with endobronchial ultrasound found no detectable mass in the region noted on the September PET scan  (g) T-DM1 discontinued and trastuzumab resumed 11/29/2020  (h) PET scan 02/26/2021 shows mild nodular increase: TDM-1 resumed 03/07/2021, discontinued after 05/09/2021 dose pending restaging studies  (i) noncontrast chest CT 06/01/2021 shows possibly mildly enlarged adenopathy, and an apparently new sclerotic lesion at T8.  There was also evidence of pancreatitis and possible pneumonitis  (j) PET scan 07/17/2021 shows progression in lymph nodes and bone, but no evidence of involvement of lung or liver and no evidence of residual pancreatitis.  There is continued unexplained gastric pyloric hypermetabolic activity (see 84Z below).  (13) Enhertu started 07/23/2021, repeated  every 21 days  (14) fulvestrant Q 21 days (alternating 500/250 mg doses) started 07/23/2021  (15) anemia of renal failure:  (A) retacrit started 12/27/2020 at 10K unit every 21 days, dose gradually increased to current 40K units Q 21 days  (B) EGD 10/19/2019 it shows duodenal ulcers, no evidence of malignancy  (16) restaging studies:  (A) PET scan 10/17/2021 shows significant response to Enhertu  (B) echo 10/24/2021 shows ejection fraction 60-65%   PLAN: [name] is now more than 2-1/2 years out from definitive diagnosis of metastatic breast cancer.  Her disease is well controlled and currently she has no symptoms related to the cancer itself.  She is also tolerating treatment well.  She will receive her sixth Enhertu dose today.  We are also continuing fulvestrant and denosumab/Xgeva.  The fulvestrant is dose adjusted so that she can receive it every 3 weeks (500 mg alternating with 250 mg).  The denosumab is given every 6 weeks.  While she is tolerating this treatment well, the question is how long to continue it.  She will be meeting with her new oncologist, Dr. Chryl Heck on 12/04/2020 and she  will review the case and adjust treatment at her discretion  While the hemoglobin remains low, it is not lower and the reticulocyte count is up.  I am hoping with treatment of the ulcers we will see a rise in the hemoglobin which will relieve the patient's anemia symptoms described above  Total encounter time 30 minutes.Sarajane Jews C. Magrinat, MD 12/03/21 10:25 PM Medical Oncology and Hematology [facility] [place], [place] [address] Tel. [phone]    Fax. [phone]   I, Wilburn Mylar, am acting as scribe for Dr. Virgie Dad. Magrinat.  I, Lurline Del MD, have reviewed the above documentation for accuracy and completeness, and I agree with the above.   *Total Encounter Time as defined by the Centers for Medicare and Medicaid Services includes, in addition to the face-to-face time of a patient visit (documented in the note above) non-face-to-face time: obtaining and reviewing outside history, ordering and reviewing medications, tests or procedures, care coordination (communications with other health care professionals or caregivers) and documentation in the medical record.

## 2021-12-04 ENCOUNTER — Inpatient Hospital Stay: Payer: BC Managed Care – PPO

## 2021-12-04 ENCOUNTER — Inpatient Hospital Stay: Payer: BC Managed Care – PPO | Admitting: Hematology and Oncology

## 2021-12-04 ENCOUNTER — Inpatient Hospital Stay: Payer: BC Managed Care – PPO | Attending: Oncology

## 2021-12-04 ENCOUNTER — Telehealth: Payer: Self-pay | Admitting: *Deleted

## 2021-12-04 NOTE — Telephone Encounter (Signed)
Per chart review per no show for appt today with Dr Chryl Heck- noted pt was admitted to [facility] with respiratory failure.  Notes are in [place].  This note will be forwarded to Dr Chryl Heck for information.

## 2021-12-12 ENCOUNTER — Encounter: Payer: Self-pay | Admitting: Hematology and Oncology

## 2021-12-12 NOTE — Progress Notes (Signed)
Enrolled patient in copay assistance program for Enhertu via Enhertu Savings Portal.  Patient approved for up to $26,000 to assist with Enhertu out of pocket cost after insurance pays their portion.  A copy of the approval letter will be mailed to patient for her records only.  A copy given to [facility] for billing/claim submissions.

## 2021-12-12 NOTE — Progress Notes (Signed)
..  Patient is receiving Assistance Medication - Supplied Externally. Medication: Retacrit (epoetin alfa-epbx) Manufacture: [employer] Approval Dates: Approved from 12/12/2021 until 12/01/2022. ID: [id number] Reason: EOOP First DOS: 12/25/2021.  *This is re-enrollment for 2023*  .Marland KitchenJuan Quam, CPhT IV Drug Replacement Specialist [place] Phone: [phone]

## 2021-12-18 ENCOUNTER — Telehealth: Payer: Self-pay | Admitting: *Deleted

## 2021-12-18 NOTE — Telephone Encounter (Signed)
This RN spoke with pt's husband, [name], per pending appts in this office.  He stated patient passed on 2023/03/07 2022/01/04.  This RN gave condolences to [employer]  Appointments canceled per Epic.

## 2021-12-24 ENCOUNTER — Other Ambulatory Visit: Payer: Self-pay | Admitting: Oncology

## 2021-12-25 ENCOUNTER — Ambulatory Visit: Payer: BC Managed Care – PPO | Admitting: Hematology and Oncology

## 2021-12-25 ENCOUNTER — Inpatient Hospital Stay: Payer: BC Managed Care – PPO

## 2021-12-25 ENCOUNTER — Other Ambulatory Visit: Payer: BC Managed Care – PPO

## 2022-01-02 DEATH — deceased

## 2022-01-28 ENCOUNTER — Encounter: Payer: BC Managed Care – PPO | Admitting: Internal Medicine
